# Patient Record
Sex: Female | Born: 1955 | Race: White | Hispanic: No | Smoking: Current every day smoker
Health system: Southern US, Community
[De-identification: ages and names within clinical notes are randomized; demographics above are authoritative.]

## PROBLEM LIST (undated history)

## (undated) DIAGNOSIS — I82409 Acute embolism and thrombosis of unspecified deep veins of unspecified lower extremity: Secondary | ICD-10-CM

## (undated) DIAGNOSIS — E119 Type 2 diabetes mellitus without complications: Secondary | ICD-10-CM

## (undated) DIAGNOSIS — Z860100 Personal history of colon polyps, unspecified: Secondary | ICD-10-CM

## (undated) DIAGNOSIS — K6819 Other retroperitoneal abscess: Secondary | ICD-10-CM

## (undated) DIAGNOSIS — I739 Peripheral vascular disease, unspecified: Secondary | ICD-10-CM

## (undated) DIAGNOSIS — E785 Hyperlipidemia, unspecified: Secondary | ICD-10-CM

## (undated) DIAGNOSIS — J449 Chronic obstructive pulmonary disease, unspecified: Secondary | ICD-10-CM

## (undated) DIAGNOSIS — K635 Polyp of colon: Secondary | ICD-10-CM

## (undated) DIAGNOSIS — R918 Other nonspecific abnormal finding of lung field: Secondary | ICD-10-CM

## (undated) DIAGNOSIS — Z720 Tobacco use: Secondary | ICD-10-CM

## (undated) DIAGNOSIS — F419 Anxiety disorder, unspecified: Secondary | ICD-10-CM

## (undated) HISTORY — DX: Personal history of colon polyps, unspecified: Z86.0100

## (undated) HISTORY — DX: Hyperlipidemia, unspecified: E78.5

## (undated) HISTORY — DX: Type 2 diabetes mellitus without complications: E11.9

## (undated) HISTORY — DX: Chronic obstructive pulmonary disease, unspecified: J44.9

## (undated) HISTORY — PX: CHOLECYSTECTOMY: SHX55

## (undated) HISTORY — DX: Personal history of colonic polyps: Z86.010

## (undated) HISTORY — DX: Other nonspecific abnormal finding of lung field: R91.8

## (undated) HISTORY — DX: Anxiety disorder, unspecified: F41.9

## (undated) HISTORY — DX: Tobacco use: Z72.0

## (undated) HISTORY — DX: Polyp of colon: K63.5

## (undated) HISTORY — DX: Acute embolism and thrombosis of unspecified deep veins of unspecified lower extremity: I82.409

## (undated) HISTORY — DX: Other retroperitoneal abscess: K68.19

---

## 2006-08-31 ENCOUNTER — Emergency Department: Payer: Self-pay | Admitting: Emergency Medicine

## 2007-05-03 HISTORY — PX: COLONOSCOPY: SHX174

## 2007-05-07 ENCOUNTER — Ambulatory Visit: Payer: Self-pay | Admitting: Gastroenterology

## 2007-12-02 ENCOUNTER — Emergency Department: Payer: Self-pay | Admitting: Emergency Medicine

## 2008-11-13 ENCOUNTER — Ambulatory Visit: Payer: Self-pay | Admitting: Cardiology

## 2008-11-13 ENCOUNTER — Inpatient Hospital Stay: Payer: Self-pay | Admitting: Internal Medicine

## 2012-08-02 DIAGNOSIS — R918 Other nonspecific abnormal finding of lung field: Secondary | ICD-10-CM

## 2012-08-02 HISTORY — DX: Other nonspecific abnormal finding of lung field: R91.8

## 2013-03-17 ENCOUNTER — Inpatient Hospital Stay: Payer: Self-pay | Admitting: Internal Medicine

## 2013-03-17 LAB — COMPREHENSIVE METABOLIC PANEL
Albumin: 1.7 g/dL — ABNORMAL LOW (ref 3.4–5.0)
Anion Gap: 15 (ref 7–16)
Bilirubin,Total: 0.5 mg/dL (ref 0.2–1.0)
Chloride: 92 mmol/L — ABNORMAL LOW (ref 98–107)
Co2: 23 mmol/L (ref 21–32)
EGFR (Non-African Amer.): >60
Glucose: 344 mg/dL — ABNORMAL HIGH (ref 65–99)
Osmolality: 276 (ref 275–301)
Potassium: 4.4 mmol/L (ref 3.5–5.1)
SGOT(AST): 18 U/L (ref 15–37)
Sodium: 130 mmol/L — ABNORMAL LOW (ref 136–145)

## 2013-03-17 LAB — CBC
HCT: 40.5 % (ref 35.0–47.0)
MCHC: 33 g/dL (ref 32.0–36.0)
MCV: 93 fL (ref 80–100)
RBC: 4.36 10*6/uL (ref 3.80–5.20)
RDW: 13.7 % (ref 11.5–14.5)

## 2013-03-17 LAB — URINALYSIS, COMPLETE
Bacteria: NONE SEEN
Bilirubin,UR: NEGATIVE
Nitrite: NEGATIVE
RBC,UR: 10 /HPF (ref 0–5)
RBC,UR: 1 /HPF (ref 0–5)
Specific Gravity: 1.005 (ref 1.003–1.030)
Specific Gravity: 1.005 (ref 1.003–1.030)
Squamous Epithelial: 2
WBC UR: 9 /HPF (ref 0–5)

## 2013-03-17 LAB — PROTIME-INR: Prothrombin Time: 14.8 secs — ABNORMAL HIGH (ref 11.5–14.7)

## 2013-03-18 LAB — COMPREHENSIVE METABOLIC PANEL
Alkaline Phosphatase: 103 U/L (ref 50–136)
Anion Gap: 7 (ref 7–16)
BUN: 10 mg/dL (ref 7–18)
Calcium, Total: 7.7 mg/dL — ABNORMAL LOW (ref 8.5–10.1)
Co2: 25 mmol/L (ref 21–32)
Glucose: 183 mg/dL — ABNORMAL HIGH (ref 65–99)
Osmolality: 276 (ref 275–301)
Potassium: 3.8 mmol/L (ref 3.5–5.1)
SGOT(AST): 19 U/L (ref 15–37)
SGPT (ALT): 7 U/L — ABNORMAL LOW (ref 12–78)
Total Protein: 5.1 g/dL — ABNORMAL LOW (ref 6.4–8.2)

## 2013-03-18 LAB — CBC WITH DIFFERENTIAL/PLATELET
Basophil #: 0.1 10*3/uL (ref 0.0–0.1)
Basophil %: 0.6 %
HGB: 10.8 g/dL — ABNORMAL LOW (ref 12.0–16.0)
Lymphocyte %: 11.6 %
Monocyte #: 1 x10 3/mm — ABNORMAL HIGH (ref 0.2–0.9)
Monocyte %: 5.4 %
Neutrophil %: 81.2 %
RBC: 3.5 10*6/uL — ABNORMAL LOW (ref 3.80–5.20)
WBC: 17.7 10*3/uL — ABNORMAL HIGH (ref 3.6–11.0)

## 2013-03-19 LAB — CBC WITH DIFFERENTIAL/PLATELET
Basophil #: 0 10*3/uL (ref 0.0–0.1)
Basophil %: 0.3 %
Eosinophil %: 3.7 %
HCT: 32.2 % — ABNORMAL LOW (ref 35.0–47.0)
HGB: 11 g/dL — ABNORMAL LOW (ref 12.0–16.0)
Lymphocyte #: 1.8 10*3/uL (ref 1.0–3.6)
MCV: 91 fL (ref 80–100)
Monocyte #: 0.8 x10 3/mm (ref 0.2–0.9)
Neutrophil %: 70.5 %
Platelet: 347 10*3/uL (ref 150–440)
RBC: 3.53 10*6/uL — ABNORMAL LOW (ref 3.80–5.20)
RDW: 13.5 % (ref 11.5–14.5)
WBC: 10.2 10*3/uL (ref 3.6–11.0)

## 2013-03-19 LAB — URINE CULTURE

## 2013-03-21 LAB — MISC AER/ANAEROBIC CULT.

## 2013-03-22 LAB — CULTURE, BLOOD (SINGLE)

## 2013-03-24 ENCOUNTER — Encounter: Payer: Self-pay | Admitting: *Deleted

## 2013-03-24 ENCOUNTER — Telehealth: Payer: Self-pay | Admitting: *Deleted

## 2013-03-24 NOTE — Telephone Encounter (Signed)
TCM attempt #1- I left a message for the patient to call. D/C 9/20 from [facility]. Follow up appointment on 9/24 with Alinda Money.

## 2013-03-25 ENCOUNTER — Encounter: Payer: Self-pay | Admitting: Physician Assistant

## 2013-03-25 NOTE — Telephone Encounter (Signed)
Pt sched for today

## 2013-04-01 ENCOUNTER — Ambulatory Visit: Payer: Self-pay | Admitting: Internal Medicine

## 2013-04-08 ENCOUNTER — Inpatient Hospital Stay: Payer: Self-pay | Admitting: Internal Medicine

## 2013-04-08 LAB — CBC
HCT: 37.2 % (ref 35.0–47.0)
MCH: 32.4 pg (ref 26.0–34.0)
MCHC: 34.4 g/dL (ref 32.0–36.0)
Platelet: 227 10*3/uL (ref 150–440)
RDW: 16.8 % — ABNORMAL HIGH (ref 11.5–14.5)
WBC: 9.1 10*3/uL (ref 3.6–11.0)

## 2013-04-08 LAB — APTT: Activated PTT: 30.6 secs (ref 23.6–35.9)

## 2013-04-08 LAB — PROTIME-INR: Prothrombin Time: 15 secs — ABNORMAL HIGH (ref 11.5–14.7)

## 2013-04-08 LAB — COMPREHENSIVE METABOLIC PANEL
Albumin: 2.4 g/dL — ABNORMAL LOW (ref 3.4–5.0)
Alkaline Phosphatase: 84 U/L (ref 50–136)
Anion Gap: 3 — ABNORMAL LOW (ref 7–16)
BUN: 10 mg/dL (ref 7–18)
Calcium, Total: 8.7 mg/dL (ref 8.5–10.1)
Chloride: 102 mmol/L (ref 98–107)
Creatinine: 0.57 mg/dL — ABNORMAL LOW (ref 0.60–1.30)
EGFR (African American): >60
EGFR (Non-African Amer.): >60
Glucose: 300 mg/dL — ABNORMAL HIGH (ref 65–99)
Osmolality: 282 (ref 275–301)
Potassium: 4.2 mmol/L (ref 3.5–5.1)
SGOT(AST): 14 U/L — ABNORMAL LOW (ref 15–37)
SGPT (ALT): 10 U/L — ABNORMAL LOW (ref 12–78)
Total Protein: 6.7 g/dL (ref 6.4–8.2)

## 2013-04-08 LAB — CULTURE, FUNGUS WITHOUT SMEAR

## 2013-04-09 LAB — CBC WITH DIFFERENTIAL/PLATELET
Basophil #: 0 10*3/uL (ref 0.0–0.1)
Eosinophil #: 0.3 10*3/uL (ref 0.0–0.7)
Eosinophil %: 4.2 %
HGB: 11 g/dL — ABNORMAL LOW (ref 12.0–16.0)
Lymphocyte %: 43.1 %
MCH: 32.2 pg (ref 26.0–34.0)
MCHC: 33.5 g/dL (ref 32.0–36.0)
Neutrophil %: 44.9 %
Platelet: 208 10*3/uL (ref 150–440)
RDW: 16.2 % — ABNORMAL HIGH (ref 11.5–14.5)
WBC: 7.1 10*3/uL (ref 3.6–11.0)

## 2013-04-09 LAB — COMPREHENSIVE METABOLIC PANEL
Alkaline Phosphatase: 98 U/L (ref 50–136)
Anion Gap: 4 — ABNORMAL LOW (ref 7–16)
BUN: 12 mg/dL (ref 7–18)
Co2: 29 mmol/L (ref 21–32)
Creatinine: 0.66 mg/dL (ref 0.60–1.30)
EGFR (African American): >60
EGFR (Non-African Amer.): >60
Glucose: 519 mg/dL (ref 65–99)
Osmolality: 291 (ref 275–301)
Sodium: 134 mmol/L — ABNORMAL LOW (ref 136–145)

## 2013-04-09 LAB — APTT
Activated PTT: 55 secs — ABNORMAL HIGH (ref 23.6–35.9)
Activated PTT: 63.2 secs — ABNORMAL HIGH (ref 23.6–35.9)

## 2013-04-09 LAB — PROTIME-INR
INR: 1.1
Prothrombin Time: 14.2 secs (ref 11.5–14.7)

## 2013-05-02 ENCOUNTER — Ambulatory Visit: Payer: Self-pay | Admitting: Internal Medicine

## 2013-06-03 ENCOUNTER — Emergency Department: Payer: Self-pay | Admitting: Emergency Medicine

## 2013-06-03 LAB — COMPREHENSIVE METABOLIC PANEL
Albumin: 3.3 g/dL — ABNORMAL LOW (ref 3.4–5.0)
Alkaline Phosphatase: 98 U/L
Anion Gap: 5 — ABNORMAL LOW (ref 7–16)
BUN: 19 mg/dL — ABNORMAL HIGH (ref 7–18)
Bilirubin,Total: 0.3 mg/dL (ref 0.2–1.0)
Chloride: 102 mmol/L (ref 98–107)
EGFR (African American): >60
EGFR (Non-African Amer.): >60
Glucose: 381 mg/dL — ABNORMAL HIGH (ref 65–99)
Potassium: 4.3 mmol/L (ref 3.5–5.1)
Total Protein: 7.2 g/dL (ref 6.4–8.2)

## 2013-06-03 LAB — PROTIME-INR
INR: 1.1
Prothrombin Time: 14.1 secs (ref 11.5–14.7)

## 2013-06-03 LAB — CBC
HCT: 41.4 % (ref 35.0–47.0)
MCHC: 33.6 g/dL (ref 32.0–36.0)
MCV: 94 fL (ref 80–100)
Platelet: 197 10*3/uL (ref 150–440)
RDW: 14.6 % — ABNORMAL HIGH (ref 11.5–14.5)

## 2013-08-21 ENCOUNTER — Inpatient Hospital Stay: Payer: Self-pay | Admitting: Internal Medicine

## 2013-08-21 LAB — BASIC METABOLIC PANEL
Anion Gap: 3 — ABNORMAL LOW (ref 7–16)
BUN: 16 mg/dL (ref 7–18)
CREATININE: 0.81 mg/dL (ref 0.60–1.30)
Calcium, Total: 8.6 mg/dL (ref 8.5–10.1)
Chloride: 104 mmol/L (ref 98–107)
Co2: 31 mmol/L (ref 21–32)
EGFR (African American): >60
EGFR (Non-African Amer.): >60
GLUCOSE: 394 mg/dL — AB (ref 65–99)
OSMOLALITY: 293 (ref 275–301)
Potassium: 4.4 mmol/L (ref 3.5–5.1)
Sodium: 138 mmol/L (ref 136–145)

## 2013-08-21 LAB — CBC
HCT: 43.8 % (ref 35.0–47.0)
HGB: 14.9 g/dL (ref 12.0–16.0)
MCH: 32.8 pg (ref 26.0–34.0)
MCHC: 34 g/dL (ref 32.0–36.0)
MCV: 97 fL (ref 80–100)
Platelet: 169 10*3/uL (ref 150–440)
RBC: 4.54 10*6/uL (ref 3.80–5.20)
RDW: 13.8 % (ref 11.5–14.5)
WBC: 6.7 10*3/uL (ref 3.6–11.0)

## 2013-08-21 LAB — APTT
ACTIVATED PTT: 26.5 s (ref 23.6–35.9)
Activated PTT: 43.7 secs — ABNORMAL HIGH (ref 23.6–35.9)

## 2013-08-21 LAB — PROTIME-INR
INR: 1.1
Prothrombin Time: 13.8 secs (ref 11.5–14.7)

## 2013-08-22 ENCOUNTER — Ambulatory Visit: Payer: Self-pay | Admitting: Hematology and Oncology

## 2013-08-22 LAB — CBC WITH DIFFERENTIAL/PLATELET
BASOS ABS: 0 10*3/uL (ref 0.0–0.1)
BASOS PCT: 0.4 %
Eosinophil #: 0.2 10*3/uL (ref 0.0–0.7)
Eosinophil %: 3.8 %
HCT: 42.5 % (ref 35.0–47.0)
HGB: 14.1 g/dL (ref 12.0–16.0)
Lymphocyte #: 3.3 10*3/uL (ref 1.0–3.6)
Lymphocyte %: 50.4 %
MCH: 31.8 pg (ref 26.0–34.0)
MCHC: 33.1 g/dL (ref 32.0–36.0)
MCV: 96 fL (ref 80–100)
Monocyte #: 0.9 x10 3/mm (ref 0.2–0.9)
Monocyte %: 13 %
NEUTROS ABS: 2.1 10*3/uL (ref 1.4–6.5)
NEUTROS PCT: 32.4 %
Platelet: 177 10*3/uL (ref 150–440)
RBC: 4.42 10*6/uL (ref 3.80–5.20)
RDW: 13.7 % (ref 11.5–14.5)
WBC: 6.6 10*3/uL (ref 3.6–11.0)

## 2013-08-22 LAB — TSH: Thyroid Stimulating Horm: 1.58 u[IU]/mL

## 2013-08-22 LAB — BASIC METABOLIC PANEL
Anion Gap: 4 — ABNORMAL LOW (ref 7–16)
BUN: 17 mg/dL (ref 7–18)
CREATININE: 0.7 mg/dL (ref 0.60–1.30)
Calcium, Total: 8.2 mg/dL — ABNORMAL LOW (ref 8.5–10.1)
Chloride: 105 mmol/L (ref 98–107)
Co2: 29 mmol/L (ref 21–32)
EGFR (Non-African Amer.): >60
GLUCOSE: 264 mg/dL — AB (ref 65–99)
OSMOLALITY: 286 (ref 275–301)
Potassium: 4 mmol/L (ref 3.5–5.1)
Sodium: 138 mmol/L (ref 136–145)

## 2013-08-22 LAB — HEMOGLOBIN A1C: HEMOGLOBIN A1C: 11.8 % — AB (ref 4.2–6.3)

## 2013-08-22 LAB — APTT
ACTIVATED PTT: 61.2 s — AB (ref 23.6–35.9)
Activated PTT: 81.8 secs — ABNORMAL HIGH (ref 23.6–35.9)

## 2013-08-24 LAB — BASIC METABOLIC PANEL
Anion Gap: 2 — ABNORMAL LOW (ref 7–16)
BUN: 16 mg/dL (ref 7–18)
CO2: 32 mmol/L (ref 21–32)
Calcium, Total: 8.6 mg/dL (ref 8.5–10.1)
Chloride: 108 mmol/L — ABNORMAL HIGH (ref 98–107)
Creatinine: 0.66 mg/dL (ref 0.60–1.30)
EGFR (Non-African Amer.): >60
Glucose: 84 mg/dL (ref 65–99)
Osmolality: 284 (ref 275–301)
Potassium: 3.9 mmol/L (ref 3.5–5.1)
Sodium: 142 mmol/L (ref 136–145)

## 2013-09-30 ENCOUNTER — Ambulatory Visit: Payer: Self-pay | Admitting: Hematology and Oncology

## 2013-09-30 LAB — PROTIME-INR
INR: 2.1
PROTHROMBIN TIME: 22.9 s — AB (ref 11.5–14.7)

## 2013-10-07 LAB — PROTIME-INR
INR: 1.1
Prothrombin Time: 14 secs (ref 11.5–14.7)

## 2013-10-12 LAB — HEPATIC FUNCTION PANEL A (ARMC)
ALK PHOS: 20 U/L — AB
AST: 18 U/L (ref 15–37)
Albumin: 3.4 g/dL (ref 3.4–5.0)
Bilirubin, Direct: 0.1 mg/dL (ref 0.00–0.20)
Bilirubin,Total: 0.3 mg/dL (ref 0.2–1.0)
SGPT (ALT): 20 U/L (ref 12–78)
Total Protein: 7.3 g/dL (ref 6.4–8.2)

## 2013-10-12 LAB — CREATININE, SERUM: Creatinine: 0.75 mg/dL (ref 0.60–1.30)

## 2013-10-12 LAB — PROTIME-INR
INR: 1.7
Prothrombin Time: 19.3 secs — ABNORMAL HIGH (ref 11.5–14.7)

## 2013-10-13 LAB — CANCER ANTIGEN 19-9: CA 19-9: 24 U/mL (ref 0–35)

## 2013-10-13 LAB — CEA: CEA: 7.7 ng/mL — AB (ref 0.0–4.7)

## 2013-10-13 LAB — CA 125: CA 125: 16.1 U/mL (ref 0.0–34.0)

## 2013-10-30 ENCOUNTER — Ambulatory Visit: Payer: Self-pay | Admitting: Hematology and Oncology

## 2013-12-09 ENCOUNTER — Emergency Department: Payer: Self-pay | Admitting: Emergency Medicine

## 2014-01-30 DIAGNOSIS — R970 Elevated carcinoembryonic antigen [CEA]: Secondary | ICD-10-CM | POA: Insufficient documentation

## 2014-02-03 ENCOUNTER — Ambulatory Visit: Payer: Self-pay | Admitting: Hematology and Oncology

## 2014-02-03 LAB — CBC CANCER CENTER
BASOS PCT: 0.5 %
Basophil #: 0 x10 3/mm (ref 0.0–0.1)
Eosinophil #: 0.3 x10 3/mm (ref 0.0–0.7)
Eosinophil %: 3.4 %
HCT: 45.3 % (ref 35.0–47.0)
HGB: 15.1 g/dL (ref 12.0–16.0)
Lymphocyte #: 2.8 x10 3/mm (ref 1.0–3.6)
Lymphocyte %: 33.1 %
MCH: 32.8 pg (ref 26.0–34.0)
MCHC: 33.4 g/dL (ref 32.0–36.0)
MCV: 98 fL (ref 80–100)
MONOS PCT: 7.9 %
Monocyte #: 0.7 x10 3/mm (ref 0.2–0.9)
NEUTROS ABS: 4.7 x10 3/mm (ref 1.4–6.5)
Neutrophil %: 55.1 %
PLATELETS: 159 x10 3/mm (ref 150–440)
RBC: 4.61 10*6/uL (ref 3.80–5.20)
RDW: 12.9 % (ref 11.5–14.5)
WBC: 8.5 x10 3/mm (ref 3.6–11.0)

## 2014-02-03 LAB — PROTIME-INR
INR: 1
Prothrombin Time: 12.6 secs (ref 11.5–14.7)

## 2014-02-04 LAB — CEA: CEA: 10.9 ng/mL — AB (ref 0.0–4.7)

## 2014-03-02 ENCOUNTER — Ambulatory Visit: Payer: Self-pay | Admitting: Hematology and Oncology

## 2014-10-13 ENCOUNTER — Inpatient Hospital Stay
Admit: 2014-10-13 | Disposition: A | Discharge status: Home / Self Care | Payer: Self-pay | Attending: Internal Medicine | Admitting: Internal Medicine

## 2014-10-13 LAB — CBC
HCT: 47.2 % — ABNORMAL HIGH (ref 35.0–47.0)
HGB: 15.1 g/dL (ref 12.0–16.0)
MCH: 31.5 pg (ref 26.0–34.0)
MCHC: 31.9 g/dL — AB (ref 32.0–36.0)
MCV: 99 fL (ref 80–100)
Platelet: 176 10*3/uL (ref 150–440)
RBC: 4.79 10*6/uL (ref 3.80–5.20)
RDW: 14 % (ref 11.5–14.5)
WBC: 8.6 10*3/uL (ref 3.6–11.0)

## 2014-10-13 LAB — BASIC METABOLIC PANEL
Anion Gap: 9 (ref 7–16)
Anion Gap: 3 — ABNORMAL LOW (ref 7–16)
BUN: 15 mg/dL
BUN: 11 mg/dL
CREATININE: 0.88 mg/dL
Calcium, Total: 8.9 mg/dL
Calcium, Total: 7.7 mg/dL — ABNORMAL LOW
Chloride: 91 mmol/L — ABNORMAL LOW
Chloride: 104 mmol/L
Co2: 30 mmol/L
Co2: 30 mmol/L
Creatinine: 0.62 mg/dL
EGFR (African American): >60
EGFR (Non-African Amer.): >60
GLUCOSE: 830 mg/dL — AB
Glucose: 286 mg/dL — ABNORMAL HIGH
POTASSIUM: 4.6 mmol/L
Potassium: 4 mmol/L
SODIUM: 130 mmol/L — AB
SODIUM: 137 mmol/L

## 2014-10-13 LAB — URINALYSIS, COMPLETE
BACTERIA: NONE SEEN
BILIRUBIN, UR: NEGATIVE
Ketone: NEGATIVE
Nitrite: NEGATIVE
Ph: 6 (ref 4.5–8.0)
Protein: 30
Specific Gravity: 1.031 (ref 1.003–1.030)

## 2014-10-13 LAB — TROPONIN I

## 2014-10-13 LAB — PROTIME-INR
INR: 0.9
Prothrombin Time: 12.6 secs

## 2014-10-14 LAB — COMPREHENSIVE METABOLIC PANEL
ALBUMIN: 2.7 g/dL — AB
Alkaline Phosphatase: 87 U/L
Anion Gap: 4 — ABNORMAL LOW (ref 7–16)
BUN: 9 mg/dL
Bilirubin,Total: <0.1 mg/dL — ABNORMAL LOW
CALCIUM: 7.7 mg/dL — AB
CHLORIDE: 109 mmol/L
CO2: 29 mmol/L
Creatinine: 0.53 mg/dL
EGFR (African American): >60
EGFR (Non-African Amer.): >60
GLUCOSE: 142 mg/dL — AB
Potassium: 3.6 mmol/L
SGOT(AST): 11 U/L — ABNORMAL LOW
SGPT (ALT): 8 U/L — ABNORMAL LOW
SODIUM: 142 mmol/L
TOTAL PROTEIN: 5.4 g/dL — AB

## 2014-10-14 LAB — CBC WITH DIFFERENTIAL/PLATELET
Basophil #: 0 10*3/uL (ref 0.0–0.1)
Basophil %: 0.4 %
EOS ABS: 0.3 10*3/uL (ref 0.0–0.7)
Eosinophil %: 3.9 %
HCT: 41.3 % (ref 35.0–47.0)
HGB: 13.6 g/dL (ref 12.0–16.0)
Lymphocyte #: 2.7 10*3/uL (ref 1.0–3.6)
Lymphocyte %: 37.4 %
MCH: 31.5 pg (ref 26.0–34.0)
MCHC: 33 g/dL (ref 32.0–36.0)
MCV: 96 fL (ref 80–100)
MONO ABS: 0.5 x10 3/mm (ref 0.2–0.9)
Monocyte %: 7.4 %
NEUTROS PCT: 50.9 %
Neutrophil #: 3.7 10*3/uL (ref 1.4–6.5)
PLATELETS: 156 10*3/uL (ref 150–440)
RBC: 4.32 10*6/uL (ref 3.80–5.20)
RDW: 13.8 % (ref 11.5–14.5)
WBC: 7.2 10*3/uL (ref 3.6–11.0)

## 2014-10-14 LAB — BASIC METABOLIC PANEL
ANION GAP: 3 — AB (ref 7–16)
BUN: 10 mg/dL
CALCIUM: 7.7 mg/dL — AB
Chloride: 106 mmol/L
Co2: 32 mmol/L
Creatinine: 0.54 mg/dL
EGFR (African American): >60
Glucose: 168 mg/dL — ABNORMAL HIGH
Potassium: 3.3 mmol/L — ABNORMAL LOW
Sodium: 141 mmol/L

## 2014-10-14 LAB — PHOSPHORUS: Phosphorus: 3.2 mg/dL

## 2014-10-14 LAB — PROTIME-INR
INR: 1
PROTHROMBIN TIME: 13.6 s

## 2014-10-14 LAB — MAGNESIUM: Magnesium: 1.6 mg/dL — ABNORMAL LOW

## 2014-10-14 LAB — CREATININE, SERUM: Creatine, Serum: 0.53

## 2014-10-15 LAB — PROTIME-INR
INR: 1.7
PROTHROMBIN TIME: 20.4 s — AB

## 2014-10-15 LAB — URINE CULTURE

## 2014-10-16 LAB — URINE CULTURE

## 2014-10-22 NOTE — Consult Note (Signed)
PATIENT NAME:  April [name], April [name] MR#:  [id number] DATE OF BIRTH:  02/09/1956  HEMATOLOGY CONSULTATION  DATE OF CONSULTATION:  04/09/2013  CONSULTING PHYSICIAN:  Knute Neuobert G. Lorre NickGittin, MD  [name] is a 59 year old patient who was admitted October 8th with a left lower extremity DVT. The pertinent history is that this patient first came in on September 16th, and was  hospitalized for 3 days from the 17th through the 19th. At that time she had a number of days of abdominal pain, nausea and vomiting. She was found to have on CT of the abdomen a retroperitoneal abscess. She was treated with Cipro and Flagyl, and surgery evaluation. CT-guided drainage was performed. A Foley catheter was also placed. The patient was discharged home on September 19th with plans with plans for surgical and urology followup. The patient is now readmitted October 8th with a history of more than week, probably 2 weeks or so, of lower extremity pain, waxing and waning and swelling. The patient describes that after going home she has a very sedentary existence, getting up for the toilet and back to bed, and then after a number of days the legs start to hurt, but keeping the swelling coming down it is progressively worse, and now she is in the Emergency Room. The Doppler is positive for extensive clot. Vascular surgery was consulted but did not feel acute intervention was indicated. The patient was started on a heparin drip.   PAST MEDICAL HISTORY: Includes diabetes, COPD; she uses oxygen on exertion but not at rest, hyperlipidemia, colon polyps as there is a record of a colonoscopy in 05/2007, and the patient is a smoker of 1-1/2 packs of cigarettes daily for many years.   No prior surgeries.   No known allergies.   HOME MEDICATIONS: Have been listed as Reglan and metformin, but also had completed a course of Flagyl and Cipro. The patient had some p.r.n. Percocet.   FAMILY HISTORY: Really unrevealing.   SOCIAL HISTORY: Smoking,  as noted. No alcohol history.   FAMILY HISTORY: The mother had brain cancer, probably a primary brain tumor, at the age of 60.   SYSTEM REVIEW: The patient was very comfortable when I evaluated her. She reports that the swelling had gone down significantly in the leg and indeed on exam the leg barely has increased pain versus the opposite leg. It is also not painful but this time. Denies headache or dizziness, chills or sweats. No visual disturbances. No ear or jaw pain. No cough, wheezing, or hemoptysis. No palpitations or retrosternal chest pain. Has chronic COPD and shortness of breath on exertion. Was not short of breath now. No wheezing. Not having abdominal pain, nausea or vomiting or diarrhea. Has had no dysuria. She had urine retention and had a Foley catheter for weeks. No cold intolerance. No polyuria. No history of easy bleeding or bruising. No extremity weakness or focal weakness, numbness or tingling, and currently feels no subjective sadness. No depression. Does not feel anxious.   On exam patient is a very thin, cachectic, but not appearing in distress. Has slight pallor. No jaundice.  HEENT: No thrush in her mouth.  LYMPH: No palpable lymph nodes in the neck. There is some in the axilla.  HEART: Regular.  LUNGS: Decreased air entry, but clear.  ABDOMEN: Nontender. No palpable mass or organomegaly.   There is minimal edema in the left leg. The patient is alert and oriented. Mood and affect is normal.   LABORATORY DATA: The baseline protime  was 1.2, and PTT was 30.6. White count 9.1, hemoglobin 12.8, platelets 227.   Liver chemistries normal except an albumin of 2.4, creatinine 0.57. Sugar was elevated on admission at 300, and a Doppler of the left lower extremity was positive for extensive occlusive deep vein thrombosis extending to the femoral and popliteal vein.   IMPRESSION AND PLAN: Patient with a new deep venous thrombosis, but this followed a hospitalization with infection,  inflammation, immobility, and the patient reports a prolonged immobility, then developing pain and swelling.   It is certainly likely that this is a proximate cause of the clot. However, the patient has had recent abscess cavity in the right lower abdomen, being followed by surgery for potential causes, those pending considered could possibly include underlying possibly malignant disease of the colon, appendix, so the patient is appropriately planned for surgical followup and colonoscopy, and plan went well on repeat CT scan of the abdomen and pelvis. Scan was done on September 16th  with no obvious malignancy, but findings of infection and inflammation. The patient will always have age-appropriate cancer screening anyway and make sure she stays up to date on mammograms, later for completeness and tumor markers, but not when the patient has abdominal infection, inflammation will get false-positive results. Although as well, I think given that there is a proximal cause of the clot it is still prudent to check lupus inhibitor and phospholipid antibodies as well as prothrombin mutation and, V liden mutation There is no family history of any other high-risk features, and this is the patient's first lifetime clot. I will follow as results are available.   Length of anticoagulation to be determined, but approximate cause if medical issues are resolved, with no underlying problem, the patient should have 3 months of anticoagulation, and if there is no residual vascular surgery issues that would be sufficient. Otherwise prolong anticoagulation depending on clinical indication.   Finally, the patient is in the age range and has smoking history of 1-1/2 packs a day for more  30 years where she could be recommended for a screening noncontrasted chest CT for cancer screening.   I will follow.    ____________________________ Knute Neu Lorre Nick, MD rgg:dm D: 04/09/2013 08:09:28 ET T: 04/09/2013 08:26:15  ET JOB#: [id number]  cc: Knute Neu. Lorre Nick, MD, <Dictator> Marin Roberts MD ELECTRONICALLY SIGNED 05/20/2013 12:24

## 2014-10-22 NOTE — Consult Note (Signed)
CT Cystogram reviewed.communication to abscess.Cx negative.to remove foley when ready from medical standpoint.do not feel cysto absoultely indicated.follow.other rec's at present.  Electronic Signatures: Smith Robertope, Conny Moening S (MD)  (Signed on 19-Sep-14 16:22)  Authored  Last Updated: 19-Sep-14 16:22 by Smith Robertope, Yolunda Kloos S (MD)

## 2014-10-22 NOTE — Consult Note (Signed)
PATIENT NAME:  April [name], April [name] MR#:  [id number] DATE OF BIRTH:  [birth year]  DATE OF CONSULTATION:  03/17/2013  CONSULTING PHYSICIAN:  Madolyn FriezeBrian S. Achilles Dunkope, MD  CLINICAL HISTORY:  Ms. April [name] is a 59 year old white female with diabetes mellitus and emphysema. She presented to the Emergency Room with abdominal pain that started 1 week prior. She also had nausea and vomiting. She denied any significant fever. She underwent a CT scan in the Emergency Room demonstrating a large retroperitoneal abscess on the right with a distended bladder with no evidence of hydronephrosis or obstruction. Urological consultation was requested for catheter placement and management of possible connection of the retroperitoneal abscess and the urinary system. Air was also noted within the bladder on the CT scan.  PAST MEDICAL HISTORY:  Significant for diabetes mellitus, anxiety, COPD with chronic oxygen use, hyperlipidemia, tobacco abuse, colonic polyps.   PAST SURGICAL HISTORY:  Significant for colonoscopy.   ALLERGIES:  No known drug allergies.   MEDICATIONS ON ADMISSION:  Metoclopramide 10 mg 1/2 tablet to 1 tablet 4 times a day.   SOCIAL HISTORY: The patient smokes 1/2 pack of cigarettes daily since the age of 59. She denies any significant alcohol or drug use.   PHYSICAL EXAMINATION: VITAL SIGNS:  Stable.  HEENT:  Within normal limits.  CHEST:  Clear to auscultation bilaterally.  CARDIOVASCULAR:  Regular rate and rhythm.  ABDOMEN:  Soft, mild generalized right abdominal tenderness. Mild right CVA tenderness. No palpable masses.  EXTREMITIES:  Free range of motion x 4.  NEUROLOGIC:  Motor and sensory grossly intact.   ASSESSMENT: Urinary retention, retroperitoneal abscess, pneumaturia.   RECOMMENDATION: The retroperitoneal abscess is likely bowel related, either from appendix or other diverticula rupture. The air within the bladder is likely related to bacteria; however, fistula formation from the abscess to the  bladder must be considered. A CT cystogram will be arranged for further evaluation from the standpoint. If a connection is identified, Foley catheter drainage with drainage of the abscess will likely resolve the connection. If no abscess is identified, I would recommend leaving the Foley catheter for at least 5 days. As the abscess is drained, she should have less difficulty with urination. This was more of a compression affect related to the abscess. I do not feel that Flomax is absolutely indicated at present. She may need eventual cystoscopy for further evaluation of the bladder. She denies any prior history of recurrent bladder infections. She had only recently started having some difficulty with urination. We will therefore monitor for now. If there are any further questions, please free to contact us.   ____________________________ Madolyn FriezeBrian S. Achilles Dunkope, MD bsc:ce D: 03/17/2013 17:45:45 ET T: 03/17/2013 18:10:31 ET JOB#: [id number]  cc: Madolyn FriezeBrian S. Achilles Dunkope, MD, <Dictator> Madolyn FriezeBRIAN S Chrystle Murillo MD ELECTRONICALLY SIGNED 03/18/2013 22:22

## 2014-10-22 NOTE — Consult Note (Signed)
General Aspect DVT left leg   Present Illness The patient is a 59 year old female with a known history of diabetes, anxiety, COPD with 2 to 2.5 liter oxygen at baseline requirement, is being admitted for new onset left lower extremity DVT. The patient was here in the hospital from 03/17/2013 until 03/20/2013 when she had a retroperitoneal abscess and urinary retention for which Foley catheter was placed.  The patient was scheduled to get her Foley removed yesterday and she missed her urology appointment with Dr. Jacqlyn Larsen so she came to the ED to get her Foley removed. While in the ED she also reported left lower extremity pain and swelling, ongoing for the last 2 to 3 weeks.  Initially she was ignoring it as the swelling seem to be improving somewhat.  However, given her history a duplex ultrasound was ordered while she was in the ED.  Lower extremity Doppler showed extensive DVT from femoral to popliteal vein which is occlusive.   She is complaining of a lot of left lower extremity pain. She denies any increase in her SOB.  She denies pleuritic chest pain.  PAST MEDICAL HISTORY: 1.  Diabetes. 2.  Anxiety. 3.  COPD on 2 to 2.5 liters oxygen at baseline. 4.  Hyperlipidemia. 5.  Tobacco abuse.    6.  Colonic polyp on colonoscopy on November 2008.   PAST SURGICAL HISTORY: None.   Home Medications: Medication Instructions Status  metFORMIN 500 mg oral tablet 1 tab(s) orally 2 times a day Active  metoclopramide 10 mg oral tablet 0.5-1 tab(s) orally 4 times a day (before meals and at bedtime) Active    No Known Allergies:   Case History:  Family History Non-Contributory   Social History positive tobacco (Greater than 1 year), negative ETOH, negative Illicit drugs   Review of Systems:  Fever/Chills No   Cough No   Sputum No   Abdominal Pain No   Diarrhea No   Constipation No   Nausea/Vomiting No   SOB/DOE No   Chest Pain No   Telemetry Reviewed NSR   Physical Exam:  GEN  well developed, disheveled, ill appearing   HEENT hearing intact to voice, moist oral mucosa, poor dentition   NECK supple  trachea midline   RESP normal resp effort  no use of accessory muscles  O2 in place by [place]   CARD regular rate  no JVD   ABD denies tenderness  nondistended   EXTR negative cyanosis/clubbing, positive edema, toes pink bilaterally and warm to touch  brisk cap refill   SKIN normal to palpation, No rashes   NEURO cranial nerves intact, follows commands   PSYCH alert, poor insight, anxious   Nursing/Ancillary Notes: **Vital Signs.:   08-Oct-14 13:00  Vital Signs Type Admission  Temperature Temperature (F) 98.3  Celsius 36.8  Pulse Pulse 97  Respirations Respirations 20  Systolic BP Systolic BP 98  Diastolic BP (mmHg) Diastolic BP (mmHg) 61  Mean BP 73  Pulse Ox % Pulse Ox % 96  Pulse Ox Activity Level  At rest  Oxygen Delivery Room Air/ 21 %   Hepatic:  08-Oct-14 10:28   Bilirubin, Total 0.3  Alkaline Phosphatase 84  SGPT (ALT)  10  SGOT (AST)  14  Total Protein, Serum 6.7  Albumin, Serum  2.4  Routine Chem:  08-Oct-14 10:28   Glucose, Serum  300  BUN 10  Creatinine (comp)  0.57  Sodium, Serum 136  Potassium, Serum 4.2  Chloride, Serum 102  CO2,  Serum 31  Calcium (Total), Serum 8.7  Osmolality (calc) 282  eGFR (African American) >60  eGFR (Non-African American) >60 (eGFR values <10m/min/1.73 m2 may be an indication of chronic kidney disease (CKD). Calculated eGFR is useful in patients with stable renal function. The eGFR calculation will not be reliable in acutely ill patients when serum creatinine is changing rapidly. It is not useful in  patients on dialysis. The eGFR calculation may not be applicable to patients at the low and high extremes of body sizes, pregnant women, and vegetarians.)  Result Comment APTT CANCEL - DUPLICATE ORDER ##[id number] Result(s) reported on 08 Apr 2013 at 12:17PM.  Anion Gap  3  Routine Coag:  08-Oct-14  10:28   Activated PTT (APTT) - (A HCT value >55% may artifactually increase the APTT. In one study, the increase was an average of 19%. Reference: "Effect on Routine and Special Coagulation Testing Values of Citrate Anticoagulant Adjustment in Patients with High HCT Values." American Journal of Clinical Pathology 2006;126:400-405.)  Activated PTT (APTT) 30.6 (A HCT value >55% may artifactually increase the APTT. In one study, the increase was an average of 19%. Reference: "Effect on Routine and Special Coagulation Testing Values of Citrate Anticoagulant Adjustment in Patients with High HCT Values." American Journal of Clinical Pathology 2006;126:400-405.)  Prothrombin  15.0  INR 1.2 (INR reference interval applies to patients on anticoagulant therapy. A single INR therapeutic range for coumarins is not optimal for all indications; however, the suggested range for most indications is 2.0 - 3.0. Exceptions to the INR Reference Range may include: Prosthetic heart valves, acute myocardial infarction, prevention of myocardial infarction, and combinations of aspirin and anticoagulant. The need for a higher or lower target INR must be assessed individually. Reference: The Pharmacology and Management of the Vitamin K  antagonists: the seventh ACCP Conference on Antithrombotic and Thrombolytic Therapy. COQHUT.6546Sept:126 (3suppl): [id number] A HCT value >55% may artifactually increase the PT.  In one study,  the increase was an average of 25%. Reference:  "Effect on Routine and Special Coagulation Testing Values of Citrate Anticoagulant Adjustment in Patients with High HCT Values." American Journal of Clinical Pathology 2006;126:400-405.)    19:46   Activated PTT (APTT)  44.3 (A HCT value >55% may artifactually increase the APTT. In one study, the increase was an average of 19%. Reference: "Effect on Routine and Special Coagulation Testing Values of Citrate Anticoagulant Adjustment in  Patients with High HCT Values." American Journal of Clinical Pathology 2006;126:400-405.)  Routine Hem:  08-Oct-14 10:28   WBC (CBC) 9.1  RBC (CBC) 3.95  Hemoglobin (CBC) 12.8  Hematocrit (CBC) 37.2  Platelet Count (CBC) 227 (Result(s) reported on 08 Apr 2013 at 12:20PM.)  MCV 94  MCH 32.4  MCHC 34.4  RDW  16.8   [place]    08-Oct-14 10:10, [place] Flow Doppler Low Extrem Left (Leg)  [place] Flow Doppler Low Extrem Left (Leg)   REASON FOR EXAM:    lle pain and swelling x2 weeks since surgery. +smoker  COMMENTS:       PROCEDURE: [place] - [place]orea[place]OPPLER LOW EXTR LEFT  - Apr 08 2013 10:10AM     RESULT: Comparison: None    Findings: Multiple longitudinal and transverse gray-scale as well as   color and spectral Doppler images of the left lower extremity veins were   obtained from the common femoral veins through the popliteal veins.    The common femoral vein, superficial femoral vein and popliteal veins are  noncompressible withno color-flow demonstrated at all venous levels most   concerning for occlusive deep venous thrombosis.  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 Corky Downs on 10 /8/ 2014 at 1010 hours.    Dictation Site: 1        Verified By: Jennette Banker, M.D., MD    Impression 1.  Left lower extremity deep vein thrombosis.  The patient will be started on IV heparin drip and subsequently Coumadin.  At the present time the patient has had some symptoms for about three weeks.  This reduces the effectiveness of lytic therapy significantly.  On exam her leg is swollen and painful but her toes are pink and her foot is warm.  I would continue heparin for now and reassess.  If her leg improves then conservative therapy is appropriate.  No final decision on intervention yet. 2.  Possible underlying malignancy. Consult Oncology, for  evaluation of possible underlying malignancy and also new blood clot in an ambulating patient suggesting hyper coagulable state.  3.  Diabetes. The patient takes metformin. At this time, we will just put her on sliding scale insulin, not knowing her kidney function.  4.  Tachycardia, likely due to pain. Will provide pain control with Norco and morphine as needed.  4.  Chronic obstructive pulmonary disease on 2 to 2.5 liters of oxygen at baseline requirement. She is at her baseline.  We will monitor her.  5.  Tobacco abuse. She was counseled for about 3 minutes. She denies any need for nicotine patch while in the hospital.   Plan level 3   Electronic Signatures: Hortencia Pilar (MD)  (Signed 09-Oct-14 10:57)  Authored: General Aspect/Present Illness, Home Medications, Allergies, History and Physical Exam, Vital Signs, Labs, Radiology, Impression/Plan   Last Updated: 09-Oct-14 10:57 by Hortencia Pilar (MD)

## 2014-10-22 NOTE — Consult Note (Signed)
PATIENT NAME:  NAVEAH, BRAVE MR#:  [id number] DATE OF BIRTH:  July 13, 1955  DATE OF CONSULTATION:  03/17/2013  REFERRING PHYSICIAN:   CONSULTING PHYSICIAN:  Chrisy Hillebrand A. Domitila Stetler, MD  REASON FOR CONSULTATION: Abdominal pain, nausea, vomiting and weight loss.   HISTORY OF PRESENT ILLNESS: Ms. Zupko is a pleasant 59 year old female with history of diabetes and emphysema, on 2.5 liters oxygen at home, who presents with approximately 3 days of worsening right lower quadrant pain. She said her pain initially began approximately 2 months ago and was up to a point where it was intolerable on Friday and presented to her doctor at [facility] and she was given p.o. Cipro and Flagyl without resolution. She has also had nausea and vomiting x 1 month, approximately 20 pound weight. Her last bowel movement was 4 days ago and was loose. She also has intermittent chills. Her blood sugars have been elevated. She takes insulin in the night and in the morning. Otherwise no fevers, chest pain, shortness of breath, cough, dysuria, or hematuria.  PAST MEDICAL HISTORY:  1.  Diabetes. 2.  COPD on 2.5 liters at home at baseline. 3.  Anxiety. 4.  Hyperlipidemia.  5.  Tobacco abuse. 6.  Sigmoid colon polyps.  7.  Back pain.   PAST SURGICAL HISTORY:  1.  Cholecystectomy. 2.  Colonoscopy in 2008.  ALLERGIES: No known drug allergies.  HOME MEDICATIONS: She says she is on  (Dictation Anomaly) insulin as well as Cipro and Flagyl.   SOCIAL HISTORY: Smokes 1/2 pack of cigarettes a day. Denies alcohol and drug use. Lives in [place]. Lives with a roommate.  FAMILY HISTORY: Mother with brain cancer. Father with stomach cancer. Multiple family members with diabetes. No history of coronary artery disease or hypertension.  REVIEW OF SYSTEMS:  A 12 point review was obtained and as above.  PHYSICAL EXAMINATION: VITAL SIGNS: Temperature 97.7, pulse 96, blood pressure 124/61, respiration 18, saturation 100%  on room air. GENERAL: No acute distress, alert and oriented x 3. HEENT: Head: Normocephalic, atraumatic. Eyes: No scleral icterus. No conjunctivitis. Face: No obvious facial trauma. Normal external nose. Normal extremity ears.  CHEST: Lungs clear to auscultation. Moves air well. HEART: Regular rate and rhythm. No murmurs, rubs, or gallops. ABDOMEN: Soft, nondistended. Tender in right lower quadrant.  GENITOURINARY: Has severe labial injection and swelling with exudate.  NEUROLOGIC: Cranial nerves II through XII grossly intact. Strength 5/5 in all 4 extremities.   LABORATORY AND DIAGNOSTICS:  Are significant for white cell count of 23.2, hemoglobin 13.4, hematocrit 40.5, platelets 387. Albumin 1.7, alk phos 144. BMP is remarkable for sodium of 130, chloride 92, BUN 17, creatinine of 0.6 and blood glucose 344.   CT scan shows intraabdominal free air but does have large amount of retroperitoneal fluid on the right side that appears to be loculated. She also has air in her bladder. The etiology of retroperitoneal fluid is completely unknown.   ASSESSMENT AND PLAN: Ms. Attridge is a pleasant 59 year old female with history of 2 months of abdominal pain worsening as well as subjective fevers and leukocytosis. She is to go to IR for percutaneous drainage of retroperitoneal fluid collection. She is not acutely ill and with her oxygen requirement would prefer to avoid surgery at this time. Will continue to watch output and will re-image in 2 to 3 days to evaluate collection well. Also, if it does not resolve with percutaneous drainage, we will consider operative drainage. ____________________________ Glena Norfolk Mabry Tift, MD cal:sb D: 03/17/2013  15:54:46 ET T: 03/17/2013 16:34:34 ET JOB#: [id number]  cc: Harrell Gave A. Tyria Springer, MD, <Dictator> Floyde Parkins MD ELECTRONICALLY SIGNED 03/21/2013 19:19

## 2014-10-22 NOTE — H&P (Signed)
PATIENT NAME:  April [name], April [name] MR#:  [id number] DATE OF BIRTH:  [birth year]  DATE OF ADMISSION:  03/17/2013  PRIMARY CARE PHYSICIAN: [facility]   REQUESTING PHYSICIAN: Lowella Fairy, MD  CHIEF COMPLAINT: Abdominal pain, nausea and vomiting.   HISTORY OF PRESENT ILLNESS: The patient is a 59 year old female with a known history of diabetes and emphysema on 2 to 2.5 liters oxygen at baseline who is being admitted for retroperitoneal abscess. The patient started having abdominal pain last Friday and has been not able to keep anything down orally. Every time she eats she throws up. She denies any fever. She has been having constant nausea and vomiting since last Friday and finally decided to come to the Emergency Department. While in the ED, she was found to have a retroperitoneal abscess on CT scan of the abdomen and pelvis and is being admitted for further evaluation and management.   PAST MEDICAL HISTORY: 1.  Diabetes.  2.  Anxiety. 3.  COPD 2 to 2.5 liters oxygen at baseline requirement. 4.  Hyperlipidemia.  5.  Tobacco abuse.  6.  Colonic polyps on colonoscopy in November 2008.   PAST SURGICAL HISTORY: None.   ALLERGIES: No known drug allergies.   HOME MEDICATIONS: 1.  Ciprofloxacin started 09/12 for total 7 days. 2.  Metoclopramide 10 mg 1/2 half to 1 tablet p.o. 4 times a day, started by her primary care physician.   SOCIAL HISTORY: She smokes 1/2 pack of cigarettes daily since she was age 16 years. No alcohol or drug abuse.   FAMILY HISTORY: Mother died of brain cancer at the age of 62.   REVIEW OF SYSTEMS: CONSTITUTIONAL: No fever, fatigue, weakness.  EYES: No blurred or double vision.  ENT: No tinnitus or ear pain.  RESPIRATORY: No cough, wheezing, hemoptysis.  CARDIOVASCULAR: No chest pain, orthopnea, edema. GASTROINTESTINAL: Positive for nausea, vomiting and abdominal pain. Her last bowel movement was 3 to 4 days ago, which she claims has been  normal.  GENITOURINARY: No dysuria or hematuria.  ENDOCRINE: No polyuria or nocturia.  HEMATOLOGY: No anemia or easy bruising.  SKIN: No rash or lesion.  MUSCULOSKELETAL: No arthritis or muscle cramp.  NEUROLOGIC: No tingling, numbness or weakness.  PSYCHIATRIC: No history of anxiety or depression.   PHYSICAL EXAMINATION: VITAL SIGNS: Temperature 97.7, heart rate 106 per minute, respirations 18 per minute, blood pressure 120/60 mmHg and she is saturating 98% on room air. GENERAL: The patient is a 59 year old female lying in the bed in acute pain.  EYES: Pupils are equal, round and reactive to light and accommodation. No scleral icterus. Extraocular muscles intact.  HENT: Head atraumatic, normocephalic. Oropharynx and nasopharynx clear.  NECK: Supple. No jugular venous distention. No thyroid enlargement or tenderness.  LUNGS: Clear to auscultation bilaterally. No wheezing, rales, rhonchi or crepitation.  HEART: S1 and S2 normal. No murmurs, rubs or gallops.  ABDOMEN: Soft. She has right lower quadrant tenderness with some guarding. No rigidity. No rebound tenderness. No organomegaly appreciated. Bowel sounds present. NEUROLOGIC: Cranial nerves III through XII intact. Muscle strength 5 out of 5 in all extremities. Sensation intact.  PSYCHIATRIC: The patient is alert and oriented x 3.  SKIN: No obvious rash, lesion or ulcer.   GENITOURINARY: Was performed with 2 female nurses being present as they were trying to get a Foley catheter inserted. She had a significant amount of yeast infection with some pain and tenderness around her genitals.  SKIN: She has presence of hirsutism signs with  hair in her mustache area and bilateral hands, mainly on the forearm areas.  LABORATORY AND DIAGNOSTICS: Normal BMP except sodium of 130, chloride 92 and blood sugar of 344. Liver function tests showed alkaline phosphatase of 144. Normal troponin of 0.02. Normal CBC except white count of 23.2. UA showed 9 WBCs,  1+ bacteria, 1+ leuk esterase.   Abdominal 3-way with PA of chest showed no evidence of bowel obstruction. No acute cardiopulmonary disease.   CT scan of the abdomen and pelvis with contrast showed retroperitoneal abscess possibly arising from the right colon, less likely from the lower pole of the right kidney. Marked distention of the urinary bladder with air-fluid level. Foley catheterization recommended. Possible that retroperitoneal fluid is communicating with urinary bladder. Moderate amount of stool in the transverse and left colon suggestive of constipation. No acute hepatobiliary abnormality. No evidence of pyelonephritis or obstruction of right kidney.   IMPRESSION AND PLAN: 1.  Retroperitoneal abscess, likely arising from right side of the bowel or lower pole of the right kidney. At this time, the case was discussed with surgeon, Dr. Juliann PulseLundquist, who recommended medical admission and no surgical intervention other than CT guided drainage for which I will place as CT drainage order. We will start her on IV Cipro and Flagyl. Certainly antibiotic can be broaden if needed. Will obtain 2 sets of blood cultures. 2.  Hyponatremia, likely due to dehydration. Will start on IV fluids and monitor.  3.  Urinary tract infection based on urinalysis. Will get urine culture and sensitivity. Continue Cipro which should cover this.  4.  Leukocytosis, likely due to urinary tract infection and retroperitoneal abscess. Will monitor.  5.  Chronic obstructive pulmonary disease on chronic 2.5 liters oxygen at baseline. This seems stable. We will monitor and put her on oxygen.  6.  Tobacco abuse. She was counseled for about 3 minutes. She is not ready to quit yet.  CODE STATUS: FULL CODE.  TOTAL TIME: Taking care of this patient was 55 minutes.  ____________________________ Ellamae SiaVipul S. Sherryll BurgerShah, MD vss:sb D: 03/17/2013 13:29:00 ET T: 03/17/2013 14:03:20 ET JOB#: [id number]  cc: Jayce Kainz S. Sherryll BurgerShah, MD, <Dictator> [facility]  [facility] A. Juliann PulseLundquist, MD Patricia PesaVIPUL S Kameren Pargas MD ELECTRONICALLY SIGNED 03/18/2013 15:47

## 2014-10-22 NOTE — Consult Note (Signed)
Brief Consult Note: Diagnosis: left leg DVT.   Discussed with Attending MD.   Comments: at the present time the patient has had some symptoms for about three weeks.  This reduces the effectiveness of lytic therapy significantly.  On exam her leg is swollen and painful but her toes are pink and her foot is warm.  I would continue heparin for now and reassess.  If her leg improves then conservative therapy is appropriate.  No final decision on intervention yet.  Electronic Signatures: Levora DredgeSchnier, Dwight Adamczak (MD)  (Signed 08-Oct-14 16:58)  Authored: Brief Consult Note   Last Updated: 08-Oct-14 16:58 by Levora DredgeSchnier, Temesgen Weightman (MD)

## 2014-10-22 NOTE — Discharge Summary (Signed)
PATIENT NAME:  April KillingsSHEPPARD, Jameika J MR#:  [id number] DATE OF BIRTH:  07/25/1955  DATE OF ADMISSION:  03/17/2013 DATE OF DISCHARGE:  03/20/2013  PRIMARY CARE PHYSICIAN: Dr. Tenny Crawoss  CONSULTATIONS: Surgery, Dr. Juliann PulseLundquist and Dr. Anda KraftMarterre; urology, Dr. Achilles Dunkope.  DISCHARGE DIAGNOSES: 1.  Retroperitoneal abscess.  2.  Urinary tract infection.  3.  Chronic obstructive pulmonary disease.  4.  Diabetes.  5.   Malnutrition 6.  Tobacco abuse.   CONDITION: Stable.   CODE STATUS: FULL CODE.  DISCHARGE MEDICATIONS: 1.  Metoclopramide 10 mg tablet 1/2 to 1 tablet 4 times a day before meals and at bedtime. 2.  Flagyl 500 mg p.o. every 8 hours for 14 days. 3.  Cipro 500 mg p.o. q. 12 hours for 14 days. 4.  Percocet 325/5 mg tablets q. 6 hours p.r.n. for pain.   DISCHARGE INSTRUCTIONS: The patient needs home health with physical therapy. The patient needs JP drainage care. The patient also has home oxygen at 2.5 liters by nasal cannula.   DIET: ADA.  ACTIVITY: As tolerated.   FOLLOW-UP CARE: Follow up with PCP within 1 to 2 weeks. Follow up with Dr. Anda KraftMarterre within 1 week for JP drainage re-evaluation. The patient needs antibiotics, Flagyl and Cipro, for 2 weeks, according to Dr. Sharmon RevereLindquist.   REASON FOR ADMISSION: Abdominal pain, nausea and vomiting.   HOSPITAL COURSE: The patient is a 59 year old Caucasian female with a history of diabetes, emphysema and COPD, on 2.5 liters home oxygen, who developed abdominal pain, nausea and vomiting. CAT scan of the abdomen in the ED showed retroperitoneal abscess. The patient was admitted for retroperitoneal abscess. For detailed history and physical examination, please refer to the admission note dictated by Dr. Sherryll BurgerShah. On admission date, the patient's laboratory data is as follows: Normal BMP except sodium 130. The patient's blood sugar was 344. WBC 23.2. Urinalysis showed 9 WBC, 1+ bacteria. Troponin 0.02. After admission, the patient has been treated with Cipro and  Flagyl IVPB. Dr. Juliann PulseLundquist saw the patient and suggested CT-guided drainage. After the procedure, the patient had a lot of bloody, cloudy drainage from the JP tube. The patient's white count decreased to 10.2, sodium increased to 136. The patient still has some abdominal pain, but she tolerated food well. I discussed with Dr. Anda KraftMarterre. He suggested the patient can be discharged to home with home health. The patient needs to follow up with him as outpatient. Because the patient has no weakness, we requested PT evaluation who suggested the patient needs home health and home physical therapy.   Diabetes has been treated with sliding scale. We will add metformin 500 mg p.o. b.i.d.   The patient is clinically stable and will be discharged to home with home health and PT today. I discussed the patient's discharge plan with Dr. Anda KraftMarterre, case manager, nurse and also called the patient's niece who is the next of kin. I discussed the patient's discharge plan with her.   TIME SPENT: About 42 minutes.  ____________________________ Shaune PollackQing Lailany Enoch, MD qc:sb D: 03/20/2013 15:58:10 ET T: 03/20/2013 16:16:42 ET JOB#: [id number]  cc: Shaune PollackQing Devetta Hagenow, MD, <Dictator> Shaune PollackQING Sierah Lacewell MD ELECTRONICALLY SIGNED 03/24/2013 11:21

## 2014-10-22 NOTE — H&P (Signed)
PATIENT NAME:  April [name], BARNER MR#:  [id number] DATE OF BIRTH:  23-Oct-1955  DATE OF ADMISSION:  04/08/2013  [facility] PHYSIC[facility]: [facility]  REQUESTING PHYSICIAN:  Dr. Jene Every.   CHIEF COMPLAINT: Left lower extremity deep vein thrombosis.   HISTORY OF PRESENT ILLNESS: The patient is a 59 year old female with a known history of diabetes, anxiety, COPD with 2 to 2.5 liter oxygen at baseline requirement, is being admitted for new onset left lower extremity DVT. The patient was here in the hospital from 03/17/2013 until 03/20/2013 when she had a retroperitoneal abscess and urinary retention for which Foley catheter was placed.  The patient was scheduled to get her Foley removed yesterday and she missed her urology appointment with Dr. Achilles Dunk this Monday and she came to the ED to get her Foley removed. While in the ED she also reported left lower extremity pain and swelling, ongoing for the last 2 to 3 weeks but was ignoring it as the swelling was going up and down, and she was walking but due to her ongoing concerns, she underwent lower extremity Doppler which showed extensive DVT from femoral to popliteal vein with occlusive nature.   She is complaining of a lot of left lower extremity pain.   She is also hungry.   PAST MEDICAL HISTORY: 1.  Diabetes. 2.  Anxiety. 3.  COPD on 2 to 2.5 liters oxygen at baseline. 4.  Hyperlipidemia. 5.  Tobacco abuse.    6.  Colonic polyp on colonoscopy on November 2008.   PAST SURGICAL HISTORY: None.   ALLERGIES: No known drug allergies.   MEDICATIONS AT HOME: 1.  Metoclopramide 10 mg p.o. 4 times a day as needed.  2.  Metformin 500 mg p.o. b.i.d.   SOCIAL HISTORY: She smokes one half pack of cigarettes daily since she was age 23. No alcohol. No drug use.   FAMILY HISTORY: Mother died of brain cancer at the age of 65. Father had heart disease.   REVIEW OF SYSTEMS: CONSTITUTIONAL: No fever, fatigue, weakness. Positive for pain in her  left lower extremity. She denies any weight changes. Her appetite is good.  EYES:  No blurry of double vision.  ENT: No tinnitus or ear pain.  RESPIRATORY: No cough or hemoptysis.  CARDIOVASCULAR: No chest pain, orthopnea. Positive for left lower extremity edema.  GASTROINTESTINAL: No nausea, vomiting, diarrhea.  GENITOURINARY: No dysuria or hematuria. She did have urinary retention for which she required a Foley on last admission, which was removed here in the Emergency Department today.  ENDOCRINE: No polyuria or nocturia.  HEMATOLOGY: No anemia or easy bruising. She does have left lower extremity deep vein thrombosis which is new. MUSCULOSKELETAL: No arthritis or muscle cramp.  NEUROLOGIC: No tingling, numbness, weakness.  PSYCHIATRIC: No history of anxiety or depression.   PHYSICAL EXAMINATION: VITAL SIGNS: Temperature 97.8, heart rate 109 per minute, respirations 20 per minute, blood pressure 126/55 mmHg, saturating 98% on room air.  GENERAL:  The patient is a 59 year old female lying in the bed comfortably without any acute distress.  EYES: Pupils equal, round, reactive to light and accommodation. No scleral icterus. Extraocular muscles intact.  HEENT:  Head atraumatic, normocephalic. Oropharynx and nasopharynx clear.  NECK: Supple.  No jugular venous distention. No thyroid enlargement or tenderness.   LUNGS: Clear to auscultation bilaterally. No wheezing, rales, rhonchi or crepitation. CARDIOVASCULAR: S1, S2 normal. No murmurs, rubs or gallops. ABDOMEN: Soft, nontender, nondistended. Bowel sounds present. No organomegaly.  EXTREMITIES:  She does  have trace left lower extremity edema but her skin is very shiny and she does have some tenderness in the calf muscles. No signs of erythema. No cyanosis, clubbing. PSYCHIATRIC:  The patient is alert and oriented x 3.  SKIN: No obvious rash, lesion or ulcer.  NEUROLOGIC: Cranial nerves II through XII are intact.  Muscles 5 out of 5 in all  extremities. Sensation intact.   LABORATORY PANEL: Labs are pending.   Her left lower extremity Doppler showed extensive occlusive DVT in the left lower extremity extending from femoral vein to the popliteal vein.   IMPRESSION AND PLAN: 1.  New-onset left lower extremity deep vein thrombosis.  The patient will be started on IV heparin drip and subsequently can be considered for Coumadin or any other new anticoagulant depending on their preference and appropriateness.  We will consult Vascular Surgery at this time for any intervention and further assessment.  2.  We will also consult Oncology, for evaluation and possible underlying malignancy and also new blood clot in an ambulating physician.  3.  Diabetes. The patient takes metformin. At this time, we will just put her on sliding scale insulin, not knowing her kidney function.  4.  Tachycardia, likely due to pain. Will provide pain control with Norco and morphine as needed.  4.  Chronic obstructive pulmonary disease on 2 to 2.5 liters of oxygen at baseline requirement. She is at her baseline.  We will monitor her.  5.  Tobacco abuse. She was counseled for about 3 minutes. She denies any need for nicotine patch while in the hospital.    Total time taking care of this patient: 55 minutes. [facility]   Please note, the patient was counseled for about 3 minutes for tobacco abuse.    ____________________________ Ellamae SiaVipul S. Sherryll BurgerShah, MD vss:dp D: 04/08/2013 11:42:30 ET T: 04/08/2013 12:00:02 ET JOB#: [id number]  cc: Champ Keetch S. Sherryll BurgerShah, MD, <Dictator> [facility] Ellamae SiaVIPUL S [facility] MD ELECTRONICALLY SIGNED 04/10/2013 13:02

## 2014-10-22 NOTE — Consult Note (Signed)
Patient seen, chart reviewed, films reviewed, note dictated. Pneumaturia, urinary retention, retroperitoneal abscess  Recommendation: The retroperitoneal abscess does not appear to be directly related to the urinary tract.  The air in the bladder could certainly be from bacteria.  There is also a chance for fistula formation to the abscess cavity.  A CT cystogram will be arranged for further evaluation from this aspect.  With otherwise recommend leaving the Foley catheter for approximately 5 days.  Once the abscess is drained, she should be able to void much more freely.  The blatter emptying was more likely related to the abscess itself.  The renal function is excellent.  There is no evidence of hydronephrosis.  If a fistula to the abscess cavity is identified, spontaneous closure with drainage of the abscess and Foley catheter placement would be all that is indicated.  She would need cystoscopy at some point in the future if this were identified.  I will continue to follow with you.  Will make further recommendations as indicated.  Electronic Signatures: Smith Robertope, Samariah Hokenson S (MD)  (Signed on 16-Sep-14 17:36)  Authored  Last Updated: 16-Sep-14 17:36 by Smith Robertope, Nilda Keathley S (MD)

## 2014-10-22 NOTE — Discharge Summary (Signed)
PATIENT NAME:  April [name], April [name] MR#:  [id number] DATE OF BIRTH:  07/14/1955  DATE OF ADMISSION:  03/17/2013 DATE OF DISCHARGE:  03/21/2013  Addendum  The patient was not discharged as it was previously planned on 03/20/2013 since she had significant and acute urinary retention after her Foley catheter was discontinued at around 3 p.m. on 03/20/2013.  The patient was initiated on in and out catheterization, however even with that the patient's urinary retention remains significant.  Her bladder scan done every eight hours despite the patient being able to void and expelling some urine from her bladder still remained at above 800.  We made decision to reinitiate Foley catheter again felt that the patient would benefit from follow-up with Dr. Achilles Dunk in the next one week after discharge.  Acute urinary retention remains of unclear etiology at this point, however we felt that the patient would not be able to have in and out catheterization performed at home since the patient has been having significant (Dictation Anomaly) labial,  perineal swelling and it is difficult even for nurses to place a Foley catheter.  Initially, we felt that the patient will need to have rehabilitation placement because of this condition, however upon discussion with social worker it does appear that in fact she did quite satisfactorily with physical therapy and it would be uphill battle with insurance company to quantify her for rehabilitation placement at this point, so decision was made to place Foley catheter again and get home health nurse to teach patient's family how to manage patient's Foley catheter as well as care for the bag.  As mentioned above, the patient is to follow up with the urologist Dr. Achilles Dunk in the next one week after discharge for further recommendations.  We did not repeat any urinalysis or cultures at this point since the patient is already on antibiotic therapy with ciprofloxacin.    In regards to access drainage  catheter, the patient's drainage catheter will be managed again with the help of patient's family and the patient's family will learn from day nurse to flush drainage catheter with 10 mL of normal saline solution every eight hours.  The patient's family was advised not to aspirate fluid afterwards and just allow it to drain to the Foley leg bag.  The patient is to follow up with Dr. Juliann Pulse or Dr. Anda Kraft in the next one week after discharge for management of her access and drainage catheter.  The patient is to continue therapy with Flagyl as well as ciprofloxacin for perforated appendix with abscess formation.  The patient is to have colonoscopy done as an outpatient whenever she heals as per prior recommendations by surgery.   In regards to her chronic medical problems such as diabetes mellitus, the patient is to continue metformin.    In regards to urinary tract infection, the patient's urine cultures came back positive for E. coli, sensitive to cefazolin, ampicillin, ceftriaxone as well as ciprofloxacin and gentamicin, ceftazidime, imipenem as well as levofloxacin, however resistant to trimethoprim sulfamethoxazole.  The patient's urine cultures came back negative for any growth in 36 hours.  The patient's retroperitoneal abscess wound culture came back positive for E. coli resistant to trimethoprim sulfamethoxazole, sensitive to all other antibiotics.  Blood cultures also were negative on 03/17/2013.  It was felt, however, that the patient very likely had urinary tract infection, however urologist disputed this issue.  I still feel due to concerns of urinary retention that patient would benefit from a cystogram or urodynamic studies  as outpatient.  The patient is being discharged in stable condition with above-mentioned medications and follow-up with home health R.N. as well as physical therapy.    Her vital signs on the day of discharge:  Temperature was 98.7, pulse was 84, respiration rate was 18,  blood pressure 110/74, saturation was 96% on room air at rest.  Due to patient's chronic respiratory failure, the patient is to continue oxygen therapy as previously scheduled at 2 to 2-1/2 liters of oxygen through nasal cannula as needed.    ____________________________ Katharina Caperima Gabbriella Presswood, MD rv:ea D: 03/21/2013 16:57:01 ET T: 03/21/2013 17:22:50 ET JOB#: [id number]  cc: Katharina Caperima Ilynn Stauffer, MD, <Dictator> Gardenia Witter MD ELECTRONICALLY SIGNED 04/06/2013 11:08

## 2014-10-22 NOTE — Discharge Summary (Signed)
PATIENT NAME:  April [name], Stevi J MR#:  [id number] DATE OF BIRTH:  1955/09/23  PRIMARY CARE PHYSICIAN: [facility].   FINAL DIAGNOSES:  1.  Deep vein thrombosis, left lower extremity, with pain.  2.  Diabetes, uncontrolled.  3.  Tachycardia.  4.  Chronic obstructive pulmonary disease.   MEDICATIONS ON DISCHARGE: Include metformin 500 mg twice a day, oxycodone 5 mg every four hours as needed for pain, glipizide XL 5 mg one tablet daily, Colace 100 mg twice a day as needed for constipation, Xarelto 15 mg one capsule twice a day with meals for 20 days. After 20 days, then the prescription changes to 20 mg oral daily for another 5 months.   HOME HEALTH: Physical therapy, nurse and social work. Help with meds and strength.   DIET: Low carbohydrate-controlled diet, low-sodium diet.   ACTIVITY: As tolerated.   FOLLOWUP: [facility].   HOSPITAL COURSE: The patient was admitted with lower extremity DVT.   HISTORY OF PRESENT ILLNESS: A 59 year old female with diabetes, anxiety, COPD, admitted with left lower extremity DVT. Recent hospitalization for retroperitoneal abscess and urinary retention. She had new-onset DVT. The patient was started on a heparin drip. Vascular surgery and oncology were consulted.   LABORATORY AND RADIOLOGICAL DATA: Included an ultrasound of the left lower extremity that showed extensive DVT, left lower extremity. White blood cell count 9.1, H and H  12.8 and 37.2, platelet count of 227, glucose 300. BUN 10, creatinine 0.57, sodium 136, potassium 4.2, chloride 102, CO2 31, calcium 8.7. Liver function tests: Albumin low at 2.4.   Chest x-ray showed no acute cardiopulmonary disease.   Hemoglobin A1c 12.6.   Initial PT 15.0, INR 1.2, PTT 30.6.   Sugar went up to 519. Sugar upon discharge 219. Hemoglobin 11.   HOSPITAL COURSE PER PROBLEM LIST:  1.  For the DVT of left lower extremity with pain, the patient was initially  started on heparin drip. Dr. Gilda CreaseSchnier saw the patient in consultation and said no intervention at this time. Dr. Lorre NickGittin also saw the patient in consultation and sent off some hypercoagulable work-up. I switched the patient over to Xarelto. It was approved by the insurance company. The patient will take Xarelto 15 mg twice a day for 20 more days and then be switched over to a 20 mg pill once a day at 5:00 p.m. for another 5 months. Benefits and risks were explained to this medication to the patient and to the patient's caretaker. Her name is April [name]. I did explain that the benefits are to treat the blood clot and prevent pulmonary embolism and death. Risks are bleeding from any site and if she falls and hits her head, she could bleed into the brain and that could cause death. The patient and caretaker understand the risks of this medication. The patient was also given oxycodone for pain. The pain should lessen as blood thinner continues to treat blood clot.  2.  Diabetes, uncontrolled. I did start low-dose glipizide XL. The patient is already on metformin.  3.  Tachycardia, likely secondary to pain, improved.  4.  COPD. The patient does look older than stated age. She is only 59 years old and looks much older than that. Respiratory status was stable during the hospitalization. No changes in management made.   TIME SPENT ON DISCHARGE: 35 minutes.    ____________________________ Herschell Dimesichard J. Renae GlossWieting, MD rjw:np D: 04/10/2013 17:38:57 ET T: 04/10/2013 19:32:24 ET  JOB#: [id number]  cc: Herschell Dimes. Renae Gloss, MD, <Dictator> Phineas Real [facility] MD ELECTRONICALLY SIGNED 04/20/2013 10:14

## 2014-10-23 NOTE — Discharge Summary (Signed)
PATIENT NAME:  April [name], April [name] DATE OF BIRTH:  27-Mar-1956  DATE OF ADMISSION:  08/21/2013 DATE OF DISCHARGE:  08/25/2013  ADMITTING DIAGNOSIS:  Deep vein thrombosis with thrombophlebitis.   DISCHARGE DIAGNOSES:  1.  Left lower extremity pain. 2. Left lower extremity deep vein thrombosis progressing with great saphenous vein thrombophlebitis, now on Lovenox and Coumadin bridge.  Followup with Dr. Wendie Simmeramiah was recommended.  3.  Peripheral artery disease.  Followup with Dr. Wyn Quakerew was recommended.  4.  Diabetes mellitus.  His hemoglobin A1c is 11.8, now on insulin therapy.  5.  Tobacco abuse, ongoing.  6.  Chronic obstructive pulmonary disease exacerbation. 7.  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  Followup with Dr. Ernest PineHooten is recommended.   DISCHARGE CONDITION:  Stable.   DISCHARGE MEDICATIONS:  The patient is to continue: 1.  Oxycodone 5 mg every 4 hours as needed. 2.  Glipizide XL 5 mg p.o. daily. 3.  Docusate sodium 100 mg p.o. twice daily as needed. 4.  Metoclopramide 10 mg daily. 5.  Simvastatin 10 mg p.o. at bedtime. 6.  Percocet 10/325 mg 1 tablet every 6 hours as needed. 7.  Albuterol 2.5 mg in 3 mL inhalation solution twice daily. 8.  Acetaminophen oxycodone 325/10 mg 1 tablet every 6 hours as needed.   9.  Warfarin 1 mg half tablet once a day for the next 3 days, then have [facility] Dr. Wendie Simmeramiah.   10.  Enoxaparin, which is Lovenox 60 mg subcutaneously once daily until the patient's ProTime now is 2.0 and above.   11.  Insulin aspart 7 units subcutaneously 3 times daily before meals. 12.  Insulin detemir 10 units subcutaneously at bedtime.   13.  Nicotine transdermal film 21 mg topically daily.   14.  Senna 8.6 mg p.o. at bedtime.    The patient is not to take metformin or Xarelto.   HOME OXYGEN:  None.   DIET:  2 gram salt, low-fat, low-cholesterol, carbohydrate-controlled diet, regular  consistency.  ACTIVITY LIMITATIONS:  As tolerated.   FOLLOWUP APPOINTMENTS:  Dr. Marval Regalom Ross in 2 days after discharge, Dr. Wend[facility]amiah [facility]th[facility] Pro[facility], Dr. Wyn Quakerew in 1 to 2 weeks after discharge, as well as Dr. Ernest PineHooten in 2 weeks after discharge.    CONSULTANTS: Dr. Wendie Simmeramiah, care management, social work, Dr. Wyn Quakerew.  RADIOLOGIC STUDIES:  CT angiogram for pulmonary embolism August 21, 2013, showed no pulmonary embolus, multiple pulmonary nodules as described.  If the patient is at high risk for bronchogenic carcinoma.  Followup with chest CT at 3 to 6 months was recommended.  If the patient is at low risk for bronchogenic carcinoma, followup chest CT in 6 to 12 months is recommended.  Doppler ultrasound, left lower extremity ultrasound, August 21, 2013, revealing evidence of prior left lower extremity DVT with nonocclusive chronic neural thrombus identified in the common femoral vein and femoral vein.  This was not causing significant luminal narrowing, and there is no definite acute DVT identified.  Superficial thrombophlebitis of great saphenous vein.  This may be more acute and symptomatic according to radiology.  CT angiogram iliofemoral runoff August 23, 2013, showing age-advanced severe mixed calcified and noncalcified irregular atherosclerotic plaque with primarily the infrarenal abdominal aorta resulting in at least 60% luminal narrowing in this study.  No evidence of abdominal aortic dissection or periaortic stranding, predominantly calcified atherosclerotic plaque within the bilateral  common iliac arteries results in approximately 50% luminal narrowing bilaterally, multifocal tandem areas with hemodynamically significant narrowing throughout the right superficial femoral and above-knee popliteal arteries.  Three vessel runoff to the right foot.  The right-sided dorsalis pedis artery is patent to the level of the forefoot.  The right-sided dorsalis pedis  artery is patent to the level of the forefoot.  Multifocal tandem areas of hemodynamically significant narrowing throughout the left superficial femoral artery and above-knee popliteal arteries.  Three vessel runoff to the left foot.  Short segmental occlusion from the proximal aspect of the left dorsalis pedis artery with early reconstitution, suspected to have a hemodynamically significant narrowing approximately 50% of the origin of the right renal artery without associated delayed right-sided renal enhancement or asymmetric right-sided renal atrophy.  Incidentally noted suspect area of fibrodysplasia involving the proximal femoral diaphysis, indeterminate subcentimeter approximately 0.9 cm area of hyper-enhancement adjacent to the subcapsular medial aspect of the posterior segment of the right lobe of the liver.  It is unclear if this structure is located within the hepatic parenchyma or associated with adjacent right adrenal gland.  Regardless, it is likely too small to adequately characterize given small size.  Further evaluation could be performed with followup abdominal MRI in 6 months, as clinically indicated.  PET CT scan, initial staging, revealed scattered small pulmonary nodules in the lungs, are multi-hypermetabolic, but are the low reliable PET CT size thresholds, low negative for predicted value.  Six month followup CT of the chest is recommended to reassessment on the size of these scattered nodules.  Bilateral airway thickening favoring bronchitis.  No reactive airway disease.  The lack of thoracic adenopathy and lack of high activity makes sarcoidosis less likely.  Atherosclerosis.  Prominent stool throughout the colon favors constipation, focus of suspected fibrodysplasia in the proximal right femur was also noted.   HOSPITAL COURSE:  The patient is a 59 year old Caucasian female with a history of tobacco abuse, ongoing, who presents to the hospital with left lower extremity pain as well as  swelling.  Please refer to Dr. Serita Grit Patel's admission on 08/21/2013.  Apparently, the patient was hospitalized in October with DVT, was started on Xarelto, was discharged home.  Now she comes back with worsening left leg pain.  On arrival to the hospital, the patient's temperature was 97.5, pulse was 97, respiration rate was 22, blood pressure 125/65, O2 sats were 98% on room air.  Physical exam revealed some discomfort and pain on palpation of her left lower extremity.  An ultrasound revealed prior lower extremity DVT with nonocclusive chronic mural thrombus and new great saphenous vein superficial thrombophlebitis, which was likely acute.  The patient was admitted to the hospital for further evaluation and started on heparin IV, and Xarelto was stopped.  Consultation with oncologist was obtained due to the patient's hypercoagulable state.  Oncologist, Dr. Wendie Simmer, felt the patient would benefit from whole-body PET CT scanning, which was performed on August 25, 2013.  It, however, did not show any hypermetabolic areas of concern, but did reveal focus of proximal right femur fibrodysplasia as well as small pulmonary nodularities which need to be reassessed in 6 months.  The patient was also evaluated for peripheral artery disease and underwent iliofemoral runoff, which showed significant disease in the left lower extremity, which was also likely cause of patient's significant pain.  Consultation with Dr. Wyn Quaker was obtained.  After work-up, the patient was advised to continue Lovenox as well as Coumadin bridge.  She is to  follow up with Dr. Wendie Simmer in the next few days after discharge to assess the patient's ProTime level and then make decisions about Coumadin dosing.    In regards to peripheral vascular disease, the patient is to follow up with Dr. Wyn Quaker in the next few days after discharge for further recommendations and possible angiogram.  The patient was also counseled about tobacco abuse.  I discussed the  patient's case in regards to right proximal femur fibrodysplasia, which was seen on PET CT scan with Dr. Ernest Pine, who recommended followup with him as well.  The patient is being discharged in stable condition with the above-mentioned medications and followup.  On the day of discharge, temperature was 97.7, pulse was 85, respiration rate was 20, blood pressure 110/70, O2 sats were 98% on room air at rest.   TIME SPENT:  40 minutes.    ____________________________ Katharina Caper, MD rv:ea D: 08/26/2013 20:23:00 ET T: 08/26/2013 23:44:54 ET JOB#: [id number]  cc: Demetrius Charity. Wendie Simmer, MD Annice Needy, MD Illene Labrador. Angie Fava., MD Dr. Lodema Hong, MD, <Dictator>    Amiel Sharrow MD ELECTRONICALLY SIGNED 09/03/2013 18:46

## 2014-10-23 NOTE — Consult Note (Signed)
History of Present Illness:  Reason for Consult Recurrent thrombosiis on Xarelto   HPI   The patient is a 59 year old Caucasian female with previous history of DVT, who was hospitalized here in October. Prior to that she had a rectal peritoneal abscess and was in the hospital and then was discharged home, returned back with swelling in the legs and was noted to have left lower extremity DVT, so she was discharged home on Xarelto. After the patient remained on Xarelto.  She continues to take Xarelto.  However, over the past 2 weeks she started having pain in the left leg. She describes the pain as sharp and as going all the way from her legs to her thighs. She could not sleep. The pain continued to persist; therefore, she came to the ED. In the ER, she was evaluated with a repeat Doppler ultrasound which showed evidence of prior left lower extremity DVT with nonocclusive chronic mural thrombus and then there was a superficial thrombophlebitis of the graft and great saphenous vein. Which because it was superficial, we asked the ED physician to speak to Dr. Delana Meyer who reviewed the ultrasound and he felt that this is extending up higher and patient therefore admitted and started on heparin ands coumadin  PFSH:  Family History noncontributory   Social History positive alcohol   Review of Systems:  General fatigue   Performance Status (ECOG) 2   HEENT no complaints   Lungs SOB   Cardiac no complaints   GI no complaints   GU no complaints   Musculoskeletal no complaints   Skin no complaints   Neuro no complaints   Endocrine no complaints   Psych no complaints   NURSING NOTES: ED Vital Sign Flow Sheet:   20-Feb-15 13:28   Temp Temperature: 97.5   Temp Source: oral   Pulse Pulse: 97   Respirations Respirations: 22   SBP SBP: 127   DBP DBP: 65   Pulse Ox % Pulse Ox %: 98   Pulse Ox Source Source: Room Air   Pain Scale (0-10) Pain Scale (0-10): Scale:9  NURSING  NOTES: **Vital Signs.:   21-Feb-15 14:33   Vital Signs Type: Routine   Temperature Temperature (F): 97.4   Celsius: 36.3   Temperature Source: oral   Pulse Pulse: 79   Respirations Respirations: 20   Systolic BP Systolic BP: 88   Diastolic BP (mmHg) Diastolic BP (mmHg): 57   Mean BP: 67   Pulse Ox % Pulse Ox %: 94   Pulse Ox Activity Level: At rest   Oxygen Delivery: Room Air/ 21 %    Eggs: N/V/Diarrhea, Itching  Laboratory Results: Thyroid:  21-Feb-15 05:39   Thyroid Stimulating Hormone 1.58 (0.45-4.50 (International Unit)  ----------------------- Pregnant patients have  different reference  ranges for TSH:  - - - - - - - - - -  Pregnant, first trimetser:  0.36 - 2.50 uIU/mL)  Routine Chem:  21-Feb-15 05:39   Glucose, Serum  264  BUN 17  Creatinine (comp) 0.70  Sodium, Serum 138  Potassium, Serum 4.0  Chloride, Serum 105  CO2, Serum 29  Calcium (Total), Serum  8.2  Anion Gap  4  Osmolality (calc) 286  eGFR (African American) >60  eGFR (Non-African American) >60 (eGFR values <31m/min/1.73 m2 may be an indication of chronic kidney disease (CKD). Calculated eGFR is useful in patients with stable renal function. The eGFR calculation will not be reliable in acutely ill patients when serum creatinine is changing rapidly. It  is not useful in  patients on dialysis. The eGFR calculation may not be applicable to patients at the low and high extremes of body sizes, pregnant women, and vegetarians.)  Hemoglobin A1c ([facility])  11.8 (The American Diabetes Association recommends that a primary goal of therapy should be <7% and that physicians should reevaluate the treatment regimen in patients with HbA1c values consistently >8%.)  Routine Coag:  21-Feb-15 05:39   Activated PTT (APTT)  61.2 (A HCT value >55% may artifactually increase the APTT. In one study, the increase was an average of 19%. Reference: "Effect on Routine and Special Coagulation Testing  Values of Citrate Anticoagulant Adjustment in Patients with High HCT Values." American Journal of Clinical Pathology 2006;126:400-405.)  Routine Hem:  21-Feb-15 05:39   WBC (CBC) 6.6  RBC (CBC) 4.42  Hemoglobin (CBC) 14.1  Hematocrit (CBC) 42.5  Platelet Count (CBC) 177  MCV 96  MCH 31.8  MCHC 33.1  RDW 13.7  Neutrophil % 32.4  Lymphocyte % 50.4  Monocyte % 13.0  Eosinophil % 3.8  Basophil % 0.4  Neutrophil # 2.1  Lymphocyte # 3.3  Monocyte # 0.9  Eosinophil # 0.2  Basophil # 0.0 (Result(s) reported on 22 Aug 2013 at 06:17AM.)   Radiology Results: LabUnknown:    20-Feb-15 18:30, CT [facility] Chest with for PE  PACS Image    Assessment and Plan: Impression:     Patient with history of rectal abscess, immbolisation with DVT Oct 2014 and now with superficial thrombophlebitis. Plan:   Agree with Heparin and coumadin.Unclear reason for Xarelto Failure. May need to be discharged on Lovenox if failed Xarelto.for malignancy related thromophlebitis.recommend PET CT.nodules on CT - will require ffup CT in 3 months.re smoking cessation  Electronic Signatures: Georges Mouse (MD)  (Signed 21-Feb-15 15:01)  Authored: HISTORY OF PRESENT ILLNESS, PFSH, ROS, NURSING NOTES, ALLERGIES, LABS, OTHER RESULTS, ASSESSMENT AND PLAN   Last Updated: 21-Feb-15 15:01 by Georges Mouse (MD)

## 2014-10-23 NOTE — Consult Note (Signed)
CHIEF COMPLAINT and HISTORY:  Subjective/Chief Complaint left leg pain   History of Present Illness Patient with a previous history of DVT about 6 months ago admitted with LLe pain.  Had venous duplex that shows acute SVT of left GSV and chronic appearing thrombus in the deep veins.  She has a long history of tobacco use which is heavy and a CTA was done to assess for PAD as a cause of her pain.  I have reviewed this CTA.  There is aortoiliac disease present, although this does not appear particularly high grade.  There is some infrainguinal stenosis in the left SFA and popliteal region as well.  Does not appear to be a CTO.  We are asked to evaluate.  Pain is at rest.  Some better with dependency.  Does have leg pain with activity chronically.  No ulcers or infection present.   PAST MEDICAL/SURGICAL HISTORY:  Past Medical History:   DVT - Deep Vein Thrombosis:    Anemia:    Emphysema:    back pain:    anxiety:    diabetes:    Denies surgical history.:   ALLERGIES:  Allergies:  Eggs: N/V/Diarrhea, Itching  HOME MEDICATIONS:  Home Medications: Medication Instructions Status  GlipiZIDE XL 5 mg oral tablet, extended release 1 tab(s) orally once a day Active  docusate sodium 100 mg oral capsule 1 cap(s) orally 2 times a day, As needed, constipation Active  rivaroxaban 15 mg oral tablet 1 tab(s) orally 2 times a day (with meals) x 20 days (after 20 days then prescription changes to 52m orally daily for another 520month Active  oxyCODONE 5 mg oral tablet 1 tab(s) orally every 4 hours, As needed, pain Active  metFORMIN 500 mg oral tablet 1 tab(s) orally 2 times a day Active  metoclopramide 10 mg oral tablet  orally  Active  simvastatin 10 mg oral tablet 1 tab(s) orally once a day (at bedtime) Active  Percocet 10/325 325 mg-10 mg oral tablet 1 tab(s) orally every 6 hours, As Needed - for Pain Active   Family and Social History:  Family History Non-Contributory   Social History  positive  tobacco (Current within 1 year), negative ETOH, 2 ppd   Place of Living Home   Review of Systems:  Fever/Chills No   Cough Yes   Sputum No   Abdominal Pain No   Diarrhea No   Constipation No   Nausea/Vomiting No   SOB/DOE No   Chest Pain No   Telemetry Reviewed NSR   Dysuria No   Tolerating PT Yes   Tolerating Diet Yes   Medications/Allergies Reviewed Medications/Allergies reviewed   Physical Exam:  GEN thin, disheveled   HEENT pink conjunctivae, moist oral mucosa   NECK No masses  trachea midline   RESP normal resp effort  no use of accessory muscles   CARD regular rate  No LE edema  no JVD   VASCULAR ACCESS none   ABD denies tenderness  soft  normal BS   GU no superpubic tenderness   LYMPH negative neck, negative axillae   EXTR negative cyanosis/clubbing, negative edema, right DP/PT both 1+.  Left AT NP, left PT 1+   SKIN normal to palpation, No ulcers, skin turgor good   NEURO cranial nerves intact, motor/sensory function intact   PSYCH alert, A+O to time, place, person   LABS:  Laboratory Results: Thyroid:    21-Feb-15 05:39, Thyroid Stimulating Hormone  Thyroid Stimulating Hormone 1.58  0.45-4.50  (International [facility])   -----------------------  Pregnant patients have   different reference   ranges for TSH:   - - - - - - - - - -   Pregnant, first trimetser:   0.36 - 2.50 uIU/mL  LabObservation:    20-Feb-15 14:50, [place] Color Flow Doppler Low Extrem Left (Leg)  OBSERVATION   Reason for Test History of DVT  Routine Chem:    20-Feb-15 92:42, Basic Metabolic Panel (w/Total Calcium)  Glucose, Serum 394  BUN 16  Creatinine (comp) 0.81  Sodium, Serum 138  Potassium, Serum 4.4  Chloride, Serum 104  CO2, Serum 31  Calcium (Total), Serum 8.6  Anion Gap 3  Osmolality (calc) 293  eGFR (African American) >60  eGFR (Non-African American) >60  eGFR values <62m/min/1.73 m2 may be an indication of chronic  kidney disease  (CKD).  Calculated eGFR is useful in patients with stable renal function.  The eGFR calculation will not be reliable in acutely ill patients  when serum creatinine is changing rapidly. It is not useful in   patients on dialysis. The eGFR calculation may not be applicable  to patients at the low and high extremes of body sizes, pregnant  women, and vegetarians.    21-Feb-15 068:34 Basic Metabolic Panel (w/Total Calcium)  Glucose, Serum 264  BUN 17  Creatinine (comp) 0.70  Sodium, Serum 138  Potassium, Serum 4.0  Chloride, Serum 105  CO2, Serum 29  Calcium (Total), Serum 8.2  Anion Gap 4  Osmolality (calc) 286  eGFR (African American) >60  eGFR (Non-African American) >60  eGFR values <629mmin/1.73 m2 may be an indication of chronic  kidney disease (CKD).  Calculated eGFR is useful in patients with stable renal function.  The eGFR calculation will not be reliable in acutely ill patients  when serum creatinine is changing rapidly. It is not useful in   patients on dialysis. The eGFR calculation may not be applicable  to patients at the low and high extremes of body sizes, pregnant  women, and vegetarians.    21-Feb-15 05:39, Hemoglobin A1c ([facility])  Hemoglobin A1c ([facility]) 11.8  The American Diabetes Association recommends that a primary goal of  therapy should be <7% and that physicians should reevaluate the  treatment regimen in patients with HbA1c values consistently >8%.    23-Feb-15 0519:62Basic Metabolic Panel (w/Total Calcium)  Glucose, Serum 84  BUN 16  Creatinine (comp) 0.66  Sodium, Serum 142  Potassium, Serum 3.9  Chloride, Serum 108  CO2, Serum 32  Calcium (Total), Serum 8.6  Anion Gap 2  Osmolality (calc) 284  eGFR (African American) >60  eGFR (Non-African American) >60  eGFR values <6032min/1.73 m2 may be an indication of chronic  kidney disease (CKD).  Calculated eGFR is useful in patients with stable renal function.  The eGFR calculation will not be reliable  in acutely ill patients  when serum creatinine is changing rapidly. It is not useful in   patients on dialysis. The eGFR calculation may not be applicable  to patients at the low and high extremes of body sizes, pregnant  women, and vegetarians.  Routine Coag:    20-Feb-15 16:25, Activated PTT  Activated PTT (APTT) 26.5  A HCT value >55% may artifactually increase the APTT. In one study,  the increase was an average of 19%.  Reference: "Effect on Routine and Special Coagulation Testing Values  of Citrate Anticoagulant Adjustment in Patients with High HCT Values."  American Journal of Clinical Pathology 2006;126:400-405.    20-Feb-15 16:25, Prothrombin Time  Prothrombin  13.8  INR 1.1  INR reference interval applies to patients on anticoagulant therapy.  A single INR therapeutic range for coumarins is not optimal for all  indications; however, the suggested range for most indications is  2.0 - 3.0.  Exceptions to the INR Reference Range may include: Prosthetic heart  valves, acute myocardial infarction, prevention of myocardial  infarction, and combinations of aspirin and anticoagulant. The need  for a higher or lower target INR must be assessed individually.  Reference: The Pharmacology and Management of the Vitamin K   antagonists: the seventh ACCP Conference on Antithrombotic and  Thrombolytic Therapy. ASTMH.9622 Sept:126 (3suppl): [id number].  A HCT value >55% may artifactually increase the PT.  In one study,   the increase was an average of 25%.  Reference:  "Effect on Routine and Special Coagulation Testing Values  of Citrate Anticoagulant Adjustment in Patients with High HCT Values."  American Journal of Clinical Pathology 2006;126:400-405.    20-Feb-15 22:43, Activated PTT  Activated PTT (APTT) 43.7  A HCT value >55% may artifactually increase the APTT. In one study,  the increase was an average of 19%.  Reference: "Effect on Routine and Special Coagulation Testing  Values  of Citrate Anticoagulant Adjustment in Patients with High HCT Values."  American Journal of Clinical Pathology 2006;126:400-405.    21-Feb-15 05:39, Activated PTT  Activated PTT (APTT) 61.2  A HCT value >55% may artifactually increase the APTT. In one study,  the increase was an average of 19%.  Reference: "Effect on Routine and Special Coagulation Testing Values  of Citrate Anticoagulant Adjustment in Patients with High HCT Values."  American Journal of Clinical Pathology 2006;126:400-405.    21-Feb-15 12:27, Activated PTT  Activated PTT (APTT) 81.8  A HCT value >55% may artifactually increase the APTT. In one study,  the increase was an average of 19%.  Reference: "Effect on Routine and Special Coagulation Testing Values  of Citrate Anticoagulant Adjustment in Patients with High HCT Values."  American Journal of Clinical Pathology 2006;126:400-405.  Routine Hem:    20-Feb-15 16:25, Hemogram, Platelet Count  WBC (CBC) 6.7  RBC (CBC) 4.54  Hemoglobin (CBC) 14.9  Hematocrit (CBC) 43.8  Platelet Count (CBC) 169  Result(s) reported on 21 Aug 2013 at 04:40PM.  MCV 97  MCH 32.8  MCHC 34.0  RDW 13.8    21-Feb-15 05:39, CBC Profile  WBC (CBC) 6.6  RBC (CBC) 4.42  Hemoglobin (CBC) 14.1  Hematocrit (CBC) 42.5  Platelet Count (CBC) 177  MCV 96  MCH 31.8  MCHC 33.1  RDW 13.7  Neutrophil % 32.4  Lymphocyte % 50.4  Monocyte % 13.0  Eosinophil % 3.8  Basophil % 0.4  Neutrophil # 2.1  Lymphocyte # 3.3  Monocyte # 0.9  Eosinophil # 0.2  Basophil # 0.0  Result(s) reported on 22 Aug 2013 at 06:17AM.   RADIOLOGY:  Radiology Results: XRay:    16-Sep-14 09:33, Abdomen 3 Way Includes PA Chest  Abdomen 3 Way Includes PA Chest  REASON FOR EXAM:    Pain, dyspnea, vomiting  COMMENTS:   May transport without cardiac monitor    PROCEDURE: DXR - DXR ABDOMEN 3-WAY (INCL PA CXR)  - Mar 17 2013  9:33AM     RESULT: The lungs appear clear. The heart and pulmonary vessels appear to    be normal. Surgical clips project over the abdomen. There is no evidence   of free air, abnormal gastric or bowel distention or significant mass   effect. The decubitus view  is overexposed.    IMPRESSION:   1. No evidence of bowel obstruction or perforation. No acute   cardiopulmonary disease evident.    Dictation Site: 2    Verified By: Sundra Aland, M.D., MD    08-Oct-14 11:03, Chest PA and Lateral  Chest PA and Lateral  REASON FOR EXAM:    cough  COMMENTS:       PROCEDURE: DXR - DXR CHEST PA (OR AP) AND LATERAL  - Apr 08 2013 11:03AM     RESULT: Comparison: None    Findings:     PA and lateral chest radiographs are provided. The lungs are   hyperinflated likely secondary to COPD. There is no focal parenchymal   opacity, pleural effusion, or pneumothorax. The heart and mediastinum are   unremarkable.  The osseous structures are unremarkable.    IMPRESSION:   No acute disease of the chest.    Dictation Site: 1        Verified By: Jennette Banker, M.D., MD  [place]:    08-Oct-14 10:10, [place] Color Flow Doppler Low Extrem Left (Leg)  [place] Color Flow Doppler Low Extrem Left (Leg)  REASON FOR EXAM:    lle pain and swelling x2 weeks since surgery. +smoker  COMMENTS:       PROCEDURE: [place]  - US DOPPLER LOW EXTR LEFT  - Apr 08 2013 10:10AM     RESULT: Comparison: None    Findings: Multiple longitudinal and transverse gray-scale as well as   color and spectral Doppler images of the left lower extremity veins were   obtained from the common femoral veins through the popliteal veins.    The common femoral vein, superficial femoral vein and popliteal veins are   noncompressible withno color-flow demonstrated at all venous levels most   concerning for occlusive deep venous thrombosis.  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 Corky Downs  on 10 /8/ 2014 at 1010 hours.    Dictation Site: 1        Verified By: Jennette Banker, M.D., MD    03-Dec-14 15:43, [place] Color Flow Doppler Low Extrem Left (Leg)  [place] Color Flow Doppler Low Extrem Left (Leg)  REASON FOR EXAM:    History of DVT  COMMENTS:       PROCEDURE: [place]  - US DOPPLER LOW EXTR LEFT  - Jun 03 2013  3:43PM     CLINICAL DATA:  History of DVT.  Pain and swelling.    EXAM:  Left LOWER EXTREMITY VENOUS DOPPLER ULTRASOUND    TECHNIQUE:  Gray-scale sonography with graded compression, as well as color  Doppler and duplex ultrasound, were performed to evaluate the deep  venous system from the level of the common femoral vein through the  popliteal and proximal calf veins. Spectral Doppler was utilized to  evaluate flow at rest and with distal augmentation maneuvers.    COMPARISON:  04/08/2013    FINDINGS:  Thrombus within deep veins: Nonocclusive thrombus in the left common  femoral vein and saphenous vein. This has improved from the prior  study. Remainder of the deep venous system is negative for DVT.     IMPRESSION:    Improvement in DVT compared with 04/08/2013. Nonocclusive thrombus  remains in the left common femoral vein and saphenous vein.    Electronically Signed  By: Franchot Gallo M.D.    On: 06/03/2013 16:10         Verified By: Truett Perna, M.D.,    20-Feb-15 14:50, [place] Color Flow Doppler Low Extrem Left (Leg)  [place] Color Flow Doppler Low Extrem Left (Leg)  REASON FOR EXAM:    History of DVT  COMMENTS:       PROCEDURE: [place]  - US DOPPLER LOW EXTR LEFT  - Aug 21 2013  2:50PM     CLINICAL DATA:  History of left lower extremity DVT. Pain and edema.    EXAM:  LEFT LOWER EXTREMITY VENOUS DOPPLER ULTRASOUND    TECHNIQUE:  Gray-scale sonography with graded compression, as well as color  Doppler and duplex ultrasound, were performed to evaluate the deep  venous system from the level of the common femoral vein through the  popliteal and proximal  calf veins. Spectral Doppler was utilized to  evaluate flow at rest and with distal augmentation maneuvers.    COMPARISON:  [place] EXTREM LOW VENOUS*L* dated 06/03/2013; [place] EXTREM LOW  VENOUS*L* dated 04/08/2013    FINDINGS:  Thrombus within deep veins: Chronic echogenicmural thrombus is  identified in the common femoral vein which is nonocclusive. Minimal  mural thrombus is identified in the femoral vein without evidence of  significant luminal narrowing. The popliteal vein and visualized  calf veins are normally patent.    Superficial veins: Nonocclusive thrombus is seen in the upper GSV  near the saphenous femoral junction. There also is evidence of  thrombus in the GSV at the level of the thigh, knee and calf. This  superficial thrombophlebitis may be more acute and the recent for  new symptoms.     IMPRESSION:  1. Evidence of prior left lower extremity DVT with nonocclusive  chronic mural thrombus identified in the common femoral vein and  femoral vein. This is not causing significant luminal narrowing and  there is no definite acute DVT identified.  2. Superficial thrombophlebitis of the great saphenous vein. This  may be more acute and symptomatic.      Electronically Signed    By: Aletta Edouard M.D.    On: 08/21/2013 15:04     Verified By: Azzie Roup, M.D.,  [place]:    16-Sep-14 09:33, Abdomen 3 Way Includes PA Chest  PACS Image    16-Sep-14 12:02, CT Abdomen and Pelvis With Contrast  PACS Image    16-Sep-14 15:41, CT Guide for Abscess Drainage (Specify)  PACS Image    17-Sep-14 10:09, CT Pelvis Without Contrast  PACS Image    08-Oct-14 10:10, [place] Color Flow Doppler Low Extrem Left (Leg)  PACS Image    08-Oct-14 11:03, Chest PA and Lateral  PACS Image    03-Dec-14 15:43, [place] Color Flow Doppler Low Extrem Left (Leg)  PACS Image    20-Feb-15 14:50, [place] Color Flow Doppler Low Extrem Left (Leg)  PACS Image    20-Feb-15 18:30, CT ANGIOGRAHPY Chest with for PE   PACS Image    22-Feb-15 18:33, CT Angiography Aorta Iliofemoral Runoff  PACS Image  CT:    16-Sep-14 12:02, CT Abdomen and Pelvis With Contrast  CT Abdomen and Pelvis With Contrast  REASON FOR EXAM:    (1) RLQ pain, leukocytosis; (2) RLQ pain, leukocytosis  COMMENTS:       PROCEDURE: CT  - CT ABDOMEN / PELVIS  W  - Mar 17 2013 12:02PM     RESULT: Axial CT scanning was performed through the abdomen  and pelvis   with reconstructions at 3 mm intervals and slice thicknesses. The patient   received oral contrast material and also received 80 cc of Isovue 370   intravenously.    There is an abnormal fluid density and soft tissue density process in the   retroperitoneum on the right extending from the renal fossa inferiorly   into the upper pelvis. This appears to have multiple septations and it   has an irregular slightly enhancing border. It insinuates itself along   the lateral and anterior aspects of the psoas muscle. It isclosely     applied to the lower pole of the right kidney but the kidney appears to   enhance well. The cecum and ascending colon are closely applied to this   abnormal inflammatory appearing mass.    The liver exhibits mild intrahepatic ductal dilation likely secondary to   previous cholecystectomy. The spleen is normal in density and contour.   The stomach and small bowel exhibit no acute abnormalities. The pancreas   exhibits no evidence of masses or inflammation. There are no adrenal   masses. The kidneys exhibit no evidence of obstruction or pyelonephritis.   The urinary bladder is quite distended and contains an air-fluid level.   The transverse and descending and rectosigmoid portions of the colon   contain a moderate amount of stool but not do not appear inflamed.  The   caliber of the abdominal aorta is normal. The lung bases are clear. The   lumbar vertebral bodies are preserved in height.  IMPRESSION:   1. There is an abnormal appearance of the  retroperitoneum on the right.   This extends from the level of the kidney inferiorly. It is closely   applied to the right kidney and to the cecum and ascending colon. The   findings are worrisome for a retroperitoneal abscess. This may be arising   from the right colon or less likely from the lower pole of the right   kidney.  2. There is marked distention of the urinary bladder. There is an   air-fluid level and there may be a small amount of mural air involving   the bladder. Foley catheterization is recommended. It is possible that   the retroperitoneal fluid collection communicates with the urinary   bladder but the site of such communication is not demonstrated.  3. There is a moderate amount of stool within the transverse and left   colon which suggests constipation.  4. There is no acute hepatobiliary abnormality.  5. While the right kidney is closely applied to the retroperitoneal fluid   collection there is no evidence of pyelonephritis nor of obstruction of   the right kidney.     Dictation Site: 1        Verified By: DAVID A. [place], M.D., MD    16-Sep-14 15:41, CT Guide for Abscess Drainage (Specify)  CT Guide for Abscess Drainage (Specify)  REASON FOR EXAM:    retroperitoneal abscess  COMMENTS:       PROCEDURE: CT  - CT GUIDED ABSCESS DRAINAGE  - Mar 17 2013  3:41PM     RESULT: CT-Guided Percutaneous Abscess Drainage       Indication: Right retroperitoneal abscess Percutaneous drainage is   requested.    Comparisons: None    Procedure:     Clinical assessment was performed and informed consent obtained.  The     patient was brought to the CT suite and placed supine on the table.  A   focused abdomen/pelvis CT without contrast was performed.     There is a large right retroperitoneal fluid collection. The origin is   difficult to delineate. The collection was deemed amenable to drainage.    The right lower quadrant was prepped and draped in the usual sterile    fashion. The overlying skin was anesthetized with 1% lidocaine. A 21   gauge micropuncture needle was inserted and the location confirmed by CT   fluoroscopy. The needle was advanced and placement within the collection   confirmed by CT and syringe aspiration of purulentmaterial which was   sent for gram stain and culture.    The 21 gauge needle was exchanged for a 5 [place] dilator over a wire. The   dilator was then exchanged for an 8 [place] transitional dilator followed     by placement of a 10.2 [place] APDL pigtail catheter into the fluid   collection.     110 mL of brown purulent material was aspirated. The specimen was sent   for Gram stain and cultures.     The catheter was secured to the skin using a StatLock device, dressed,   and connected to a drainagebag.    The procedure was well tolerated and without complication. Hemostasis was   achieved. The patient was transferred to the recovery unit in stable   condition.    IMPRESSION:   Successful CT-guided placement of a 10.2 [place] APDL pigtail catheter   into a right retroperitoneal fluid collection.    Dictation Site: 1        Verified By: Jennette Banker, M.D., MD    17-Sep-14 10:09, CT Pelvis Without Contrast  CT Pelvis Without Contrast  REASON FOR EXAM:    Pneumocystis, Retroperitoneal Abscess  (CT Cystogram   with Contrast through foley  COMMENTS:       PROCEDURE: CT  - CT PELVIS STANDARD WO  - Mar 18 2013 10:09AM     RESULT: A triphasic study to allow cystography of the urinary bladder was   performed. The patient received oral contrast on yesterday's abdominal   and pelvic CT and today received intravesical contrast.    On the noncontrast images the drainage catheter can be seen in the   abscess cavity on the right in the lower abdomen and upper pelvis. The   urinary bladder is contains gas is largely decompressed by a Foley   catheter. Following instillation of contrast material no leakage of the    intravesical contrast into these into the surrounding soft tissues is     demonstrated. There is gas present in the lumen of the urinary bladder.   On delayed images the urinary bladder is nearly totally decompressed with   only a tiny amount of contrast and gas present. The Foley catheter   remains visible.    Free fluid is demonstrated within the pelvis with no evidence of leakage   of contrast into this free fluid. The uterus it is somewhat vertically   oriented.    IMPRESSION:    1. There is no evidence of extravasation of contrast from the urinary   bladder. Thereis gas within the urinary bladder lumen which was   demonstrated on yesterday's study. A Foley catheter is in place. A Foley   catheter is present.  2. The abscess cavity has been drained in the right lower abdomen and     upper pelvis.     DictationSite: 2  Verified By: DAVID A. [place], M.D., MD    [phone] 18:33, CT Angiography Aorta Iliofemoral Runoff  CT Angiography Aorta Iliofemoral Runoff  REASON FOR EXAM:    pain in LLE, r/o arterial obstruction/insufficiency  COMMENTS:  Change per Dr. Ether Griffins     PROCEDURE: CT  - CT ANGIOGRAPHY AORTA RUNOFF  - Aug 23 2013  6:33PM     CLINICAL DATA:  Left lower leg pain, evaluate for arterial  obstruction/insufficiency    EXAM:  CT ANGIOGRAPHY OF ABDOMINAL AORTA WITH ILIOFEMORAL RUNOFF    TECHNIQUE:  Multidetector CT imaging of the abdomen, pelvis and lower  extremities was performed using the standard protocol during bolus  administration of intravenous contrast. Multiplanar CT image  reconstructions and MIPs were obtained to evaluate the vascular  anatomy.    CONTRAST:  One her cc Isovue 370    COMPARISON:  CT ABD-PELV W/ CM dated 03/17/2013; CT ANGIO CHEST dated  08/21/2013    FINDINGS:  Vascular Findings:    Abdominal aorta: There is age advanced severe mixed calcified and  noncalcified irregular atherosclerotic plaque involving  predominantly  in the infrarenal abdominal aorta resulting an at  least 60% luminal narrowing just caudal to the takeoff of the IMA,  approximately 1.7 cm caudal to the aortic bifurcation. While of the  aortic lumen is irregular, there is no evidence of abdominal aortic  dissection. No evidence of abdominal aortic stranding.    Celiac artery: Widely patent; conventional branching pattern.    SMA: There is a minimal amount of eccentric mixed calcified and  noncalcified atherosclerotic plaque involving the mid aspect of the  main trunk of the SMA (image 31, series 9), not resulting in  hemodynamically significant stenosis. Conventional branching  pattern. The distal tributaries of the SMA appear widely patent.  There are no discrete filling defects within the opacified portions  of the distal tributaries of the SMA to suggest distal embolism.    Right Renal artery: Solitary with early bifurcation ; there is a  mixture of calcified and noncalcified atherosclerotic plaque  involving the origin of the right renal artery (representative axial  images 25 and 26, series 9, coronal image 44, series 13) which  likely results in at least 50% luminal narrowing. This finding is  without associated delayed perfusion of the right kidney or  asymmetric right-sided renal atrophy.    Left Renal artery: Solitary; there is a minimal amount of mixed  calcified and noncalcified atherosclerotic plaque involving the  origin of the left renal artery, not resulting in hemodynamically  significant stenosis.    IMA: Widely patent.    ---------------------------------------------------------------------------  -----    RIGHT LOWER EXTREMITY VASCULATURE:    Right-sided pelvic vasculature: There is mixed noncalcified and  predominantly calcified atherosclerotic plaque involving the right  common iliac artery, which likely approaches 50% luminal narrowing  (image 60, series 9). The right external iliac artery is  widely  patent and free if clinically significant narrowing. The right  internal iliac artery is heavily disease still patent and of normal  caliber.    Right common femoral artery: There is a eccentric mixed calcified  and noncalcified atherosclerotic plaque within the right common  femoral artery, (images 106 and 107, series 9), not definitely  resulting in hemodynamically significant narrowing.    Right deep femoral artery: Widely patent without hemodynamically  significant narrowing.    Right superficial femoral artery: There is extensive mixed calcified  and noncalcified atherosclerotic plaque throughout the right  superficial  femoral artery resulting in multi focal tandem areas of  hemodynamically significant narrowing (at least 60%) -  representative proximal narrowing - image 124, series 9; mid - image  152; distally - image 182.    Right popliteal artery: There is eccentric predominantly  noncalcified atherosclerotic plaque involvingthe proximal aspect of  the right above knee popliteal artery (image 192, series 9) which  results in approximately 50% luminal narrowing. There is eccentric  mixed calcified and noncalcified atherosclerotic plaque involving  the intercondylar aspectof the right above knee popliteal artery  which results in severe (greater than 70%) luminal narrowing (image  225, series 9). The right below-knee popliteal artery is diminutive  throughout its course though patent.    Right lower leg: Three-vessel runoff to the right foot. The peroneal  artery appropriately tapers at the level of the lower leg. The  dorsalis pedis artery is patent to the level of the forefoot.      ---------------------------------------------------------------------------  ------  LEFT LOWER EXTREMITY VASCULATURE:    Left-sided pelvic vasculature: There is mixture of noncalcified and  predominantly calcified atherosclerotic plaque involving the left  common iliac artery,  likely resulting in 50% luminal narrowing  within its distal aspect (image 60, series 9). The left external  iliac artery is widely patent and free of hemodynamically  significant narrowing. The left internal iliac artery is heavily  diseased though patent and of normal caliber.    Left common femoral artery: There is eccentric mixed calcified and  noncalcified atherosclerotic plaque within the right common femoral  artery (image 110, series 9) not definitely result in  hemodynamically significant narrowing.  Left deep femoral artery:  Widely patent.    Left superficial femoral artery: There are multi focal tandem areas  of hemodynamically significant narrowing (at least 60%) throughout  the left superficial femoral artery - representative proximal  hemodynamically significant narrowing - images 117 and 119, series  9, mid- image 167; distal -image 184.    Left popliteal artery: There is a mixture of eccentric calcified and  noncalcified atherosclerotic plaque within the proximal aspect of  the left above knee popliteal artery which resultsin severe  (greater 70%) narrowing - image 211, series 9. There additional  tandem areas of clinically significant narrowing throughout the left  above knee popliteal artery which is diminutive throughout its  course. The left below-knee popliteal artery is diminutive though  patent.    Left lower leg: Three-vessel runoff to the left lower foot. The  peroneal artery expectedly tapers at the level of the lower leg. The  left dorsalis pedis artery occludes at the level of the hind foot  (image 135,series 10) though reconstitutes proximally (image 139  and is patent to the level of the forefoot.    Review of the MIP images confirms the above findings.      ---------------------------------------------------------------------------  -----    Nonvascular Findings:  Evaluation of the abdominal organs is limited due to the arterial  phase of  enhancement.    Limited visualization of the lower thorax is negative for focal  airspace opacity or pleural effusion.    No definite pericardial effusion.    Normal hepatic contour. There is an ill-defined subcentimeter  (approximately 0.9 x 0.8 cm area of hyper enhancement adjacent to  the subcapsular medial aspect of the posterior segment of the right  lobe of the liver (image 23, series 9). It is unclear whether this  structure arises from the adjacent right adrenal gland or represent  a discrete hepatic lesion. Post cholecystectomy. There is mild  dilatation of the common bile duct and central aspect of  predominantly the left intrahepatic biliary tree, presumably  secondary to post cholecystectomy state. No ascites.    There is symmetric enhancement of the bilateral kidneys. No definite  renal stones on this postcontrast examination. No discrete renal  lesions. No urinary obstruction or perinephric stranding. There is  grossly unchanged thickening of the crux of the the left adrenal  gland without discrete nodule. The right adrenal gland is  suboptimally evaluated secondary to arterial phase of enhancement  and imaging technique. Normal appearance pancreas and spleen.    Moderate to large colonic stool burden without evidence of enteric  obstruction. Normal appearance of the appendix. No pneumoperitoneum,  pneumatosis or portal venous gas.  No bulky retroperitoneal, mesenteric, pelvic or inguinal  lymphadenopathy though evaluation is degraded due to imaging  technique and lack of significant mesenteric fat.    Normal appearance of the pelvic organs. No free fluid within the  pelvis. The urinary bladder appears slightly patulousbut otherwise  normal.    Subcutaneous emphysema is noted about the left lower anterior  abdominal wall presumably at the location of subdural dermal  medication administration.    There is an approximately 1.6 x 1.5 cm ground-glass lesion  within  the proximal aspects of the right femoral diaphysis which is without  associated cortical disruption, periostitis or soft tissue mass.   IMPRESSION:  1. Age advanced severe mixed calcified and noncalcified irregular  atherosclerotic plaque within primarily the infrarenal abdominal  aorta resulting in at least 60% luminal narrowing distally. No  evidence of abdominal aortic dissection or periaortic stranding.  2. Predominately calcified atherosclerotic plaque within the  bilateral common iliac arteries results in approximately 50% luminal  narrowing bilaterally.  3. Multi focal tandem areas of hemodynamically significant narrowing  throughout the right superficial femoral and above knee popliteal  arteries. Three-vessel runoff to the right foot. The right-sided  dorsalis pedis artery is patent to the level of the forefoot appear  4. Multi focal tandem areas of hemodynamically significant narrowing  throughout the left superficial femoral artery and above knee  popliteal arteries. Three-vesselrunoff to the left foot.  Short-segment occlusion of the proximal aspect the left dorsalis  pedis artery with early reconstitution.  5. Suspected hemodynamically significant narrowing (approximately  50%) of the origin of the right renal artery without associated  delayed right-sided renal enhancement or asymmetric right-sided  renal atrophy.  6. 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  7. Indeterminate sub centimeter (approximately 0.9 cm) area of  hyperenhancement adjacent to the subcapsular medial aspect of the  posterior segment of the right lobe of the liver - it is unclear if  this structure is located within the hepatic parenchyma or  associated with the adjacent right adrenal gland the regardless is  likely too small to accurately characterize given small size.  Further evaluation could be performed with follow-up abdominal  MRI  in 6 months as clinically indicated.      Electronically Signed    By: Sandi Mariscal M.D.    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  There is aortoiliac disease present, although this does not appear particularly high  grade.  There is some infrainguinal stenosis in the left SFA and popliteal region as well.  Does not appear to be a CTO.  This may be contributing to chronic rest pain, and her pain may be worse with SVT in place as well as post-phlebitic issues.  Needs to stop smoking, and no intervention will be durable if she keeps smoking.   Angiogram with possible revascularization seems reasonable and will plan to do later this week.  She reports a malignancy work up tomorrow.  Discussed procedure with patient and her daughter, and they are desireable to proceed.     level 5   Electronic Signatures: Algernon Huxley (MD)  (Signed 23-Feb-15 15:30)  Authored: Chief Complaint and History, PAST MEDICAL/SURGICAL HISTORY, ALLERGIES, HOME MEDICATIONS, Family and Social History, Review of Systems, Physical Exam, LABS, RADIOLOGY, Assessment and Plan   Last Updated: 23-Feb-15 15:30 by Algernon Huxley (MD)

## 2014-10-23 NOTE — H&P (Signed)
PATIENT NAME:  April [name], April [name] MR#:  [id number] DATE OF BIRTH:  1956-02-08  DATE OF ADMISSION:  08/21/2013  EMERGENCY DEPARTMENT REFERRING PHYSICIAN: Dr. Dorothea Glassman.   PRIMARY CARE PROVIDER:  (Dictation Anomaly)    CHIEF COMPLAINT: Left leg pain.   HISTORY OF PRESENT ILLNESS: The patient is a 59 year old Caucasian female with previous history of DVT, who was hospitalized here in October. Prior to that she had a rectal peritoneal abscess and was in the hospital and then was discharged home, returned back with swelling in the legs and was noted to have left lower extremity DVT, so she was discharged home on Xarelto. After the patient remained on Xarelto.  She continues to take Xarelto.  However, over the past 2 weeks she started having pain in the left leg. She describes the pain as sharp and as going all the way from her legs to her thighs. She could not sleep. The pain continued to persist; therefore, she came to the ED. In the ER, she was evaluated with a repeat Doppler ultrasound which showed evidence of prior left lower extremity DVT with nonocclusive chronic mural thrombus and then there was a superficial thrombophlebitis of the graft and great saphenous vein. Which because it was superficial, we asked the ED physician to speak to Dr. Gilda Crease who reviewed the ultrasound and he felt that this is extending up higher and could be potentially devastating to the patient's cause of pulmonary embolism. Therefore, he recommended the patient be admitted and be started on IV heparin. He will see the patient. She also complains of "knots" in her chest and abdomen which have popped up recently. She does have intermittent shortness of breath related to cough, but denies any chest pain. No fevers. No chills. No abdominal pain. No nausea, vomiting or diarrhea. Denies any urinary frequency, urgency or hesitancy.   PAST MEDICAL HISTORY: Significant for history of DVT of the left lower extremity, diabetes poorly  controlled, history of tachycardia, history of COPD, history of retroperitoneal abscess associated UTI, has been drained in the past. History of colonic polyp on colonoscopy in November 2008 status post resection.  ALLERGIES: None.   CURRENT MEDICATIONS:  Medication list is not available. The patient is not 100% sure of what she is on but she does know she takes Xarelto.   SOCIAL HISTORY: Continues to smoke 2 packs per day. No alcohol or drug use.   FAMILY HISTORY: Mother had brain cancer probably primary brain tumor.  REVIEW OF SYSTEMS:  CONSTITUTIONAL: Denies any fevers, fatigueness, weakness. Complains of pain in the leg. No weight loss, weight gain.  EYES: No blurred or double vision. No pain. No redness. No inflammation. No glaucoma. No cataract.  ENT: No tinnitus. No ear pain. No hearing loss. No seasonal or year-round allergies. No epistaxis. No nasal discharge. No snoring. No postnasal drip. No difficulty swallowing.  RESPIRATORY: Does have intermittent cough, has a diagnosis of COPD. No TB. No pneumonia.  CARDIOVASCULAR: Denies any chest pain, orthopnea, edema or arrhythmia.  GASTROINTESTINAL: No nausea, vomiting, diarrhea. No abdominal pain. No hematemesis. No melena. No ulcer.  GENITOURINARY: Denies any dysuria, hematuria, renal calculus or frequency.  ENDOCRINE: Denies any polyuria, nocturia or thyroid problems. HEMATOLOGIC AND LYMPHATIC:  Denies any major bruisability or bleeding.  SKIN: No acne. No rash. No changes in hair, mole or skin.  MUSCULOSKELETAL: Denies any pain in the neck, back or shoulder.  NEUROLOGIC: No numbness, CVA, TIA.  PSYCHIATRIC: Has significant anxiety, but no insomnia or ADD.  PHYSICAL EXAMINATION: VITAL SIGNS: Temperature 97.5, pulse 97, respirations 22, blood pressure 127/65, O2 98% on room air.  GENERAL: The patient is a thin female in no acute distress.  HEENT: Head atraumatic, normocephalic. Pupils equally round, reactive to light and  accommodation. There is no conjunctival pallor. No scleral icterus. Nasal exam shows no drainage or ulceration. Oropharynx is clear without any exudate.  NECK: Supple without any JVD.  CARDIOVASCULAR: Regular rate and rhythm. No murmurs, rubs, clicks or gallops. PMI is not displaced.  LUNGS: Clear to auscultation bilaterally without any rales, rhonchi, wheezing.  ABDOMEN: Soft, nontender, nondistended. Positive bowel sounds x 4.  EXTREMITIES: I do not see any obvious swelling or erythema. Pulses: Good DP, PT pulses.  NEUROLOGIC: Awake, alert, oriented x 3. No focal deficits.  PSYCHIATRIC: Not anxious or depressed.  LYMPHATICS: No lymph nodes palpable.  DIAGNOSTIC DATA:  Ultrasound of the lower extremities showed evidence of prior left lower extremity DVT with nonocclusive chronic mural thrombus in the common femoral vein and femoral vein. This is not causing any significant luminal narrowing. Superficial thrombophlebitis of the great saphenous vein.    Labs:  Glucose 394, BUN 16, creatinine 0.81, sodium 138, potassium 4.4, chloride 104. CO2 is 31. Calcium is 8.6. WBC 6.7, hemoglobin169. INR is 1.1.  The patient is a 59 year old white female without history of deep vein thrombosis in the lower extremity on the left was in the hospital 10/14 with deep venous thrombosis.  After being discharged from the hospital previously was seen by hematology and vasculature now presents back with worsening pain in the left leg, has new superficial left vein thrombus ED. PA spoke to Dr. Gilda CreaseSchnier . He recommends admission due to clot extending upwards.  1.  Deep vein thrombosis with superficial thrombophlebitis with concern of the clot in the superficial vein extending into the deeper vein. At this time, we will treat her with IV heparin, stop Xarelto.  I am going to ask hematology to see the patient, as well as Dr. Gilda CreaseSchnier. Coumadin likely can be started tomorrow due to patient having been on Xarelto already. CT per  pulmonary embolism protocol ordered per pulmonary embolism per the ED PA. We will follow up on the results.  2.  Diabetes. Was on metformin and glipizide. We will obtain home medication list prior to resumption. I am going to place her on sliding scale insulin, check a hemoglobin A1c. 3.  Chronic obstructive pulmonary disease. We will do p.r.n. O24.  Nicotine addiction.  The patient counseled regarding smoking cessation, 3 minutes spent. Told the patient to stop smoking. Nicotine patch will be started.    NOTE:  50 minutes spent.    ____________________________ Lacie ScottsShreyang H. Allena KatzPatel, MD shp:ce D: 08/21/2013 18:14:59 ET T: 08/21/2013 18:26:10 ET JOB#: [id number]  cc: Cameryn Chrisley H. Allena KatzPatel, MD, <Dictator> Charise CarwinSHREYANG H Simrat Kendrick MD ELECTRONICALLY SIGNED 09/04/2013 17:58

## 2014-10-25 ENCOUNTER — Ambulatory Visit
Admit: 2014-10-25 | Disposition: A | Discharge status: Home / Self Care | Payer: Self-pay | Attending: Internal Medicine | Admitting: Internal Medicine

## 2014-10-25 LAB — PROTIME-INR
INR: 1
Prothrombin Time: 12.9 secs

## 2014-10-26 LAB — CEA: CEA: 7.1 ng/mL — ABNORMAL HIGH (ref 0.0–4.7)

## 2014-10-28 ENCOUNTER — Other Ambulatory Visit: Payer: Self-pay | Admitting: Internal Medicine

## 2014-10-28 DIAGNOSIS — R911 Solitary pulmonary nodule: Secondary | ICD-10-CM

## 2014-10-29 ENCOUNTER — Encounter: Payer: Self-pay | Admitting: *Deleted

## 2014-10-31 NOTE — Discharge Summary (Signed)
PATIENT NAME:  April [name], April [name] MR#:  [id number] DATE OF BIRTH:  Jan 24, 1956  DATE OF ADMISSION:  10/13/2014 DATE OF DISCHARGE:  10/15/2014  PRESENTING COMPLAINT: Lower abdominal pain, frequent urination.   DISCHARGE DIAGNOSES: 1. Acute cystitis.  2. Hyperosmolar nonketotic hyperglycemia.  3. Uncontrolled type 2 diabetes.  4. 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  5. Chronic smoker.  CODE STATUS: FULL CODE.   MEDICATIONS: 1. Acetaminophen/oxycodone 325/10 one tablet 4 times a day as needed.  2. Advair 250/50 one puff b.i.d.  3. Albuterol oral inhaler 2 puffs every 4 hours as needed.  4. Levemir 10 units subcutaneous at bedtime.  5. NovoLog FlexPen 10 units t.i.d.  6. Warfarin 6 mg p.o. daily.   7. Keflex 500 mg b.i.d.  8. Glucerna shake one can b.i.d.   FOLLOWUP:  [facility]. [facility], to get/INR checked.  2. Follow up with Benita Gutterobert Gittin, MD, for your chronic lower extremity DVT.   LABORATORY AND IMAGING:  INR at discharge was 1.7. Ultrasound Doppler lower extremities shows no acute deep vein thrombosis. Chronic nonocclusive echogenic mural thrombus appears less prominent in the common femoral vein and stable in femoral vein. The great saphenous vein now is recanalized with some nonocclusive chronic thrombus present at the level of the saphenofemoral junction extending just into the upper segment of the GSV in the proximal thigh. Urine culture negative in 8-12 hours.   BRIEF SUMMARY OF HOSPITAL COURSE: April [name] is a 59 year old Caucasian female who looks older than her stated age, came in with increased frequency of urination. She was admitted with:  1. Nonketotic hyperosmolar hyperglycemia. She was placed on IV fluids, IV insulin drip.   She was resumed back on Levemir and NovoLog.  Dietary and  nonmedical noncompliance were discussed.  2. Acute cystitis. The patient was given a course of Keflex.  3. History of left lower  extremity DVT. The patient has history of noncompliance with Coumadin along with follow-up with Dr. Lorre NickGittin. Ultrasound lower extremity Doppler was done to ensure clot has resolved; however, the patient does have chronic mural thrombus; hence, I continued her p.o. warfarin.   INR at discharge was 1.7. [facility]. Importance for continue taking Coumadin was discussed with the patient.  4. COPD, stable. Continue the inhalers.   Hospital stay otherwise remained stable. The patient remained a full code. Home health PT has been arranged.   TIME SPENT: 40 minutes.     ____________________________ Wylie HailSona A. Allena KatzPatel, MD sap:tr D: 10/16/2014 06:53:00 ET T: 10/16/2014 12:01:13 ET JOB#: [id number]  cc: Germain Koopmann A. Allena KatzPatel, MD, <Dictator> Willow OraSONA A Garin Mata MD ELECTRONICALLY SIGNED 10/20/2014 10:34

## 2014-10-31 NOTE — H&P (Signed)
PATIENT NAME:  April [name], April [name] MR#:  [id number] DATE OF BIRTH:  06/26/1956  DATE OF ADMISSION:  10/13/2014  PRIMARY CARE PHYSICIAN:  Pricilla Riffle, MD   REFERRING EMERGENCY ROOM PHYSICIAN: Acquanetta Belling, MD   CHIEF COMPLAINT: Lower abdominal pain, frequent urination.   HISTORY OF PRESENT ILLNESS: The patient is a 59 year old female with history of insulin-requiring diabetes mellitus, recent history of bilateral lower extremity DVT, noncompliance is presenting to the ED with a chief complaint, "I think I have a UTI, I am peeing all night." The patient was complaining of lower abdominal pain and frequent urination, no fevers; complaining of nausea intermittently. No vomiting; states compliant with her insulin, but BMP has revealed a glucose at 830, with normal bicarbonate and anion gap; denies any chest pain or shortness of breath.  The patient sees Dr. Lorre Nick as outpatient for bilateral lower extremity deep vein thrombosis on Coumadin, but INR is at 0.9. Hospitalist team is called to admit the patient. Patient had 2 liters of IV fluid boluses.   PAST MEDICAL HISTORY:  1.  Insulin-requiring diabetes mellitus.  2.  COPD.  3.  Bilateral lower extremity DVT.  4.  Hyperlipidemia.  5.  The patient has chronic hypoxic respiratory failure, lives on 2 to 2.5 liters of oxygen as baseline.  6.  Anxiety.  7.  Tobacco abuse.  8.  Colon polyp on colonoscopy.   PAST SURGICAL HISTORY: Colonoscopy.   ALLERGIES: No known drug allergies.   PSYCHOSOCIAL HISTORY: Lives at home with daughter, patient used to smoke 2 packs per day, now she is smoking half pack a day, denies alcohol or illicit drug usage.   FAMILY HISTORY: Mother died from brain cancer at age 49; father had heart disease.   REVIEW OF SYSTEMS:   CONSTITUTIONAL: Denies any fever, fatigue; complaining of weakness, chronic bilateral lower extremity pain.  EYES: Denies blurry vision, double vision.  EARS, NOSE, AND THROAT: Denies epistaxis,  discharge.  RESPIRATORY: Denies cough, has COPD, lives on 2.5 liters of oxygen via nasal cannula.  CARDIOVASCULAR: No chest pain, denies any palpitations.  GASTROINTESTINAL: Complaining of nausea; no vomiting, diarrhea.  GENITOURINARY: Complaining of polyuria, no hematuria, some degree of dysuria and complaining of polyuria, no nocturia, has insulin-requiring diabetes mellitus.  HEMATOLOGIC AND LYMPHATIC: Has bilateral lower extremity DVT on Coumadin; follows with Dr. Lorre Nick.  MUSCULOSKELETAL: No arthritis but has chronic lower extremity pain.  NEUROLOGIC: Denies any TIA, no seizures, no memory loss.  PSYCHIATRIC: No anxiety, depression.   HOME MEDICATIONS: The patient takes Percocet at 325/10 mg 1 tablet p.o. 4 times a day as needed for pain; Advair 250/50 one puff inhalation 2 times a day; albuterol 2 puffs inhalations every 4 hours as needed for shortness of breath; Coumadin 3 mg 1 tablet p.o. once daily and  Levemir 10 units subcutaneously once daily at bedtime, NovoLog 10 units 3 times a day with meals.   PHYSICAL EXAMINATION: VITAL SIGNS: Temperature 97.6, pulse 105, respirations 18, blood pressure 130/64, pulse oximetry 99% on room air.  GENERAL APPEARANCE: Thin looking female in no apparent distress.  HEENT: Normocephalic, atraumatic, pupils are equal and reactive to light and accommodation, no scleral icterus, no conjunctival injection; no sinus tenderness, no postnasal drip, dry mucous membranes.  NECK: Supple, no JVD, no thyromegaly.   LUNGS: Clear to auscultation bilaterally. No accessory muscle use and no anterior chest wall tenderness on palpation.  CARDIAC: S1, S2 normal, regular rate and rhythm, no murmurs.  GASTROINTESTINAL: Soft, bowel sounds  are positive in all 4 quadrants; lower abdominal tenderness is present. No masses felt. No rebound tenderness.  NEUROLOGIC: Awake, alert, oriented x 3; cranial nerves II through XII are grossly intact, motor and sensory are intact, reflexes  are 2+.  EXTREMITIES: No cyanosis. No clubbing.  SKIN: Warm to touch, dry in nature; no rashes, no lesions.  MUSCULOSKELETAL: No joint effusion, tenderness, or erythema; chronic bilateral calf  tenderness is present and takes Percocet regarding that.  PSYCHIATRIC: Normal mood and affect.   LABORATORY AND IMAGING STUDIES:  PT is 12.6, INR 0.9. Urinalysis yellow in color, clear in appearance, glucose greater than 500, pH 6.0, protein 30 mg/dL, nitrites negative, leukocyte 1+; WBC too numerous count, troponin less than 0.03. BMP: Glucose 830, repeat BMP is pending. BUN and creatinine are normal, sodium at 130, potassium 4.6, chloride 91, CO2 of 30, anion gap 9, GFR greater than 60; calcium 8.9, WBC 8.6, hemoglobin 15.1, hematocrit 37.6, platelets are 176,000, MCV 99.   EKG: Low voltage, normal sinus rhythm with occasional PVCs at 84 beats per minute, no acute ST-T wave changes.   ASSESSMENT AND PLAN: A 59 year old Caucasian female presenting to the emergency department with a chief complaint, "I might have a UTI, urinating a lot."  Her BMP has revealed glucose at 800; patient has received 2 liters of fluid boluses in the emergency department, hospitalist is called to admit the patient.  1.  Hyperosmolar, nonketotic hyperglycemia. We will admit her to intensive care unit. The patient is started on insulin drip for non-diabetic ketoacidosis hyperglycemia. We will monitor BMP every 6 hours. We will provide aggressive hydration with intravenous fluids, normal saline at 250 mL/h. When the blood sugar is at 200, we will change the fluids to D5 half normal with 20 potassium; patient is reporting that she is compliant with her medications including insulin; we will check hemoglobin A1c and also put a consult to diabetic clinic lifestyle.  2.  Acute cystitis, lower abdominal pain and nausea. Urine culture is ordered. We will put her on empiric antibiotic with IV Rocephin. Follow up on the urine culture and change  the antibiotic per culture report.  3.  Bilateral lower extremity deep vein thrombosis. Patient is seeing Dr. Lorre NickGittin as an outpatient. Her INR is nontherapeutic. The patient seemed to be noncompliant. We will restart Coumadin at 7.5 mg p.o. once daily and provide Lovenox subcutaneous for deep vein thrombosis prophylaxis at this point. Also put a consult to Dr. Lorre NickGittin, patient's oncologist as there is a major concern for noncompliance. By reviewing his notes we came to know that patient is forgettable and noncompliant with her medication including Coumadin.  4.  Chronic obstructive pulmonary disease. Currently no exacerbation; we will provide her Advair and albuterol treatments as needed basis.  5.  Tobacco abuse. The patient is a cutting down but still smokes 1/2 pack a day, counseled to quit smoking for 3 to 5 minutes. She verbalized understanding. We will provide nicotine patch.  6.  Chronic history of peripheral vascular disease. Dr. Lorre NickGittin is referring the patient to vascular surgery as an outpatient.  7.  She is full code, daughter is the medical power of attorney.  8.  We will provide gastrointestinal prophylaxis; deep vein thrombosis prophylaxis with Lovenox subcutaneous we will continue Coumadin and pharmacy to manage Coumadin.   Plan of care discussed with the patient.   Total critical care time spent is 50 minutes.    ____________________________ Ramonita LabAruna Jessyka Austria, MD ag:nt D: 10/13/2014 18:51:07 ET  T: 10/13/2014 19:49:39 ET JOB#: [id number]  cc: Ramonita Lab, MD, <Dictator> Pricilla Riffle, MD Knute Neu Lorre Nick, MD  Ramonita Lab MD ELECTRONICALLY SIGNED 10/15/2014 12:59

## 2014-11-01 ENCOUNTER — Ambulatory Visit: Payer: Self-pay | Admitting: Internal Medicine

## 2014-11-08 ENCOUNTER — Ambulatory Visit: Payer: Self-pay | Admitting: Internal Medicine

## 2014-11-09 ENCOUNTER — Emergency Department
Admission: EM | Admit: 2014-11-09 | Discharge: 2014-11-10 | Disposition: A | Discharge status: Home / Self Care | Payer: Medicare Other | Attending: Student | Admitting: Student

## 2014-11-09 ENCOUNTER — Other Ambulatory Visit: Payer: Self-pay

## 2014-11-09 ENCOUNTER — Emergency Department: Payer: Medicare Other

## 2014-11-09 DIAGNOSIS — E11649 Type 2 diabetes mellitus with hypoglycemia without coma: Secondary | ICD-10-CM | POA: Insufficient documentation

## 2014-11-09 DIAGNOSIS — Z7901 Long term (current) use of anticoagulants: Secondary | ICD-10-CM | POA: Insufficient documentation

## 2014-11-09 DIAGNOSIS — B9689 Other specified bacterial agents as the cause of diseases classified elsewhere: Secondary | ICD-10-CM | POA: Diagnosis not present

## 2014-11-09 DIAGNOSIS — Z720 Tobacco use: Secondary | ICD-10-CM | POA: Insufficient documentation

## 2014-11-09 DIAGNOSIS — Z7951 Long term (current) use of inhaled steroids: Secondary | ICD-10-CM | POA: Diagnosis not present

## 2014-11-09 DIAGNOSIS — Z794 Long term (current) use of insulin: Secondary | ICD-10-CM | POA: Diagnosis not present

## 2014-11-09 DIAGNOSIS — N390 Urinary tract infection, site not specified: Secondary | ICD-10-CM | POA: Insufficient documentation

## 2014-11-09 DIAGNOSIS — E162 Hypoglycemia, unspecified: Secondary | ICD-10-CM

## 2014-11-09 DIAGNOSIS — A499 Bacterial infection, unspecified: Secondary | ICD-10-CM

## 2014-11-09 LAB — COMPREHENSIVE METABOLIC PANEL
ALK PHOS: 78 U/L (ref 38–126)
ALT: 9 U/L — AB (ref 14–54)
AST: 16 U/L (ref 15–41)
Albumin: 3.3 g/dL — ABNORMAL LOW (ref 3.5–5.0)
Anion gap: 13 (ref 5–15)
BUN: 22 mg/dL — ABNORMAL HIGH (ref 6–20)
CALCIUM: 8.2 mg/dL — AB (ref 8.9–10.3)
CO2: 21 mmol/L — AB (ref 22–32)
Chloride: 106 mmol/L (ref 101–111)
Creatinine, Ser: 0.8 mg/dL (ref 0.44–1.00)
GFR calc Af Amer: >60 mL/min (ref >60)
Glucose, Bld: 203 mg/dL — ABNORMAL HIGH (ref 65–99)
Potassium: 3 mmol/L — ABNORMAL LOW (ref 3.5–5.1)
Sodium: 140 mmol/L (ref 135–145)
Total Bilirubin: 0.1 mg/dL — ABNORMAL LOW (ref 0.3–1.2)
Total Protein: 6.2 g/dL — ABNORMAL LOW (ref 6.5–8.1)

## 2014-11-09 LAB — CBC
HEMATOCRIT: 43.8 % (ref 35.0–47.0)
Hemoglobin: 14.3 g/dL (ref 12.0–16.0)
MCH: 31.5 pg (ref 26.0–34.0)
MCHC: 32.6 g/dL (ref 32.0–36.0)
MCV: 96.6 fL (ref 80.0–100.0)
Platelets: 167 10*3/uL (ref 150–440)
RBC: 4.53 MIL/uL (ref 3.80–5.20)
RDW: 14.1 % (ref 11.5–14.5)
WBC: 16.5 10*3/uL — AB (ref 3.6–11.0)

## 2014-11-09 LAB — GLUCOSE, CAPILLARY
GLUCOSE-CAPILLARY: 74 mg/dL (ref 70–99)
Glucose-Capillary: 23 mg/dL — CL (ref 70–99)
Glucose-Capillary: 61 mg/dL — ABNORMAL LOW (ref 70–99)

## 2014-11-09 MED ORDER — DEXTROSE 50 % IV SOLN
1.0000 | Freq: Once | INTRAVENOUS | Status: AC
  Administered 2014-11-09: 50 mL via INTRAVENOUS

## 2014-11-09 NOTE — ED Provider Notes (Signed)
[facility] Emergency Department Provider Note  ____________________________________________  Time seen: Approximately 9:26 PM  I have reviewed the triage vital signs and the nursing notes.   HISTORY  Chief Complaint Hypoglycemia    HPI April KillingsDoris J Sanfilippo is a 59 y.o. female with past medical history significant for COPD, diabetes, hyperlipidemia, bilateral DVT on Coumadin, presents for evaluation of altered mental status. Family at bedside reports that this afternoon she was appearing sluggish, fatigued, tired. Gradual onset. Constant and worsening. In the waiting room, she appeared to be altered, diaphoretic with glucose of 23 for which she received dextrose with dramatic improvement in her symptoms. She reports she has been compliant with her insulin but she only ate "some chicken livers today". She did not have much of an appetite. She does not take any oral medications for diabetes. She has chronic bilateral lower extremity pain secondary to DVTs but denies any recent illness including no cough, sneezing, runny nose, congestion, chest pain, difficulty breathing.   Past Medical History  Diagnosis Date  . Diabetes mellitus without complication   . Hyperlipidemia   . Anxiety   . COPD (chronic obstructive pulmonary disease)   . Tobacco abuse   . History of colonic polyps   . DVT (deep venous thrombosis)   . Pulmonary nodules/lesions, multiple 08/2012    NEGATIVE PET SCAN  . Retroperitoneal abscess     history of in 04/2013  . Colon polyps     There are no active problems to display for this patient.   Past Surgical History  Procedure Laterality Date  . Colonoscopy  05/2007    Current Outpatient Rx  Name  Route  Sig  Dispense  Refill  . albuterol (PROVENTIL HFA;VENTOLIN HFA) 108 (90 BASE) MCG/ACT inhaler   Inhalation   Inhale 2 puffs into the lungs every 4 (four) hours as needed for wheezing or shortness of breath.         .  Fluticasone-Salmeterol (ADVAIR) 250-50 MCG/DOSE AEPB   Inhalation   Inhale 1 puff into the lungs 2 (two) times daily.         . insulin aspart (NOVOLOG) 100 UNIT/ML FlexPen   Subcutaneous   Inject 20-60 Units into the skin 3 (three) times daily. Pt uses 20 units in the morning, 20 units at lunch, and 60 units at bedtime.         . Insulin Detemir (LEVEMIR) 100 UNIT/ML Pen   Subcutaneous   Inject 10 Units into the skin at bedtime.         Marland Kitchen. oxyCODONE-acetaminophen (PERCOCET) 10-325 MG per tablet   Oral   Take 1 tablet by mouth every 4 (four) hours as needed for pain.         Marland Kitchen. warfarin (COUMADIN) 1 MG tablet   Oral   Take 6 mg by mouth daily.            Allergies Eggs or egg-derived products  Family History  Problem Relation Age of Onset  . Brain cancer Mother     Social History History  Substance Use Topics  . Smoking status: Current Every Day Smoker -- 0.50 packs/day for 45 years    Types: Cigarettes  . Smokeless tobacco: Not on file     Comment: Smoking History 2.5-started at age 59  . Alcohol Use: Not on file    Review of Systems Constitutional: No fever/chills Eyes: No visual changes. ENT: No sore throat. Cardiovascular: Denies chest pain. Respiratory: Denies shortness of breath. Gastrointestinal:  No abdominal pain.  No nausea, no vomiting.  No diarrhea.  No constipation. Genitourinary: Negative for dysuria. Musculoskeletal: Negative for back pain. Skin: Negative for rash. Neurological: Negative for headaches, focal weakness or numbness.  10-point ROS otherwise negative.  ____________________________________________   PHYSICAL EXAM:  VITAL SIGNS: ED Triage Vitals  Enc Vitals Group     BP 11/09/14 2115 100/45 mmHg     Pulse Rate 11/09/14 2115 90     Resp 11/09/14 2115 20     Temp --      Temp Source 11/09/14 2115 Oral     SpO2 11/09/14 2115 94 %     Weight 11/09/14 2115 94 lb (42.638 kg)     Height 11/09/14 2115  (1.575 m)      Head Cir --      Peak Flow --      Pain Score 11/09/14 2117 9     Pain Loc --      Pain Edu? --      Excl. in GC? --     Constitutional: Alert and oriented. Well appearing and in no acute distress. Sitting up in bed eating a [place] sandwich and chips. Eyes: Conjunctivae are normal. PERRL. EOMI. Head: Atraumatic. Nose: No congestion/rhinnorhea. Mouth/Throat: Mucous membranes are moist.  Oropharynx non-erythematous. Neck: No stridor.  .Cardiovascular: Normal rate, regular rhythm. Grossly normal heart sounds.  Good peripheral circulation. Respiratory: Normal respiratory effort.  No retractions. Lungs CTAB. Gastrointestinal: Soft and nontender. No distention. No abdominal bruits. No CVA tenderness. Genitourinary: deferred Musculoskeletal: No cyanosis, clubbing or edema, symmetric palpable DP pulses bilaterally Neurologic:  Normal speech and language. No gross focal neurologic deficits are appreciated. Speech is normal. Skin:  Skin is warm, dry and intact. No rash noted. Psychiatric: Mood and affect are normal. Speech and behavior are normal.  ____________________________________________   LABS (all labs ordered are listed, but only abnormal results are displayed)  Labs Reviewed  CBC - Abnormal; Notable for the following:    WBC 16.5 (*)    All other components within normal limits  COMPREHENSIVE METABOLIC PANEL - Abnormal; Notable for the following:    Potassium 3.0 (*)    CO2 21 (*)    Glucose, Bld 203 (*)    BUN 22 (*)    Calcium 8.2 (*)    Total Protein 6.2 (*)    Albumin 3.3 (*)    ALT 9 (*)    Total Bilirubin 0.1 (*)    All other components within normal limits  GLUCOSE, CAPILLARY - Abnormal; Notable for the following:    Glucose-Capillary 23 (*)    All other components within normal limits  GLUCOSE, CAPILLARY - Abnormal; Notable for the following:    Glucose-Capillary 61 (*)    All other components within normal limits  GLUCOSE, CAPILLARY  URINALYSIS COMPLETEWITH  MICROSCOPIC ([facility])   APTT  PROTIME-INR  CBG MONITORING, ED   ____________________________________________  EKG  ED ECG REPORT   Date: 11/09/2014  EKG Time: 23:06  Rate: 78  Rhythm:  normal sinus rhythm  Axis: normal  Intervals:none  ST&T Change: prolonged QT which is chronic/unchanged from 10/2014  ____________________________________________  RADIOLOGY  none ____________________________________________   PROCEDURES  Procedure(s) performed: None  Critical Care performed: Yes, see critical care note(s). Total critical care time spent 40 minutes.  ____________________________________________   INITIAL IMPRESSION / ASSESSMENT AND PLAN / ED COURSE  Pertinent labs & imaging results that were available during my care of the patient were reviewed by me and  considered in my medical decision making (see chart for details).  April [name] is a 59 y.o. female with past medical history significant for COPD, diabetes, hyperlipidemia, bilateral DVT on Coumadin, presents for evaluation of altered mental status was likely secondary to hypoglycemia. She is alert and oriented  4, sitting up in bed and eating a sandwich, intact neurological examination after D50. We'll obtain basic labs, screening EKG and observe her in the ER. If she is able to maintain blood sugar, anticipate discharge with PCP f/u.  ----------------------------------------- 11:43 PM on 11/09/2014 -----------------------------------------  Patient continues to feel well. Blood glucose 74. Labs notable for leukocytosis which is likely reactive/catecholamine induced but given her fatigue today, advance age we'll obtain chest x-ray and UA to rule out infectious process. Care transferred to Dr. Manson PasseyBrown. Anticipate discharge if her blood glucose remains stable. ____________________________________________   FINAL CLINICAL IMPRESSION(S) / ED DIAGNOSES  Final diagnoses:  Hypoglycemia      Gayla DossEryka A Ranie Chinchilla,  MD 11/09/14 2344

## 2014-11-09 NOTE — ED Notes (Signed)
Pt to triage room #3; pt currently unresponsive, diaphoretic, drooling, with snoring respirations. FSBS 23 in triage. D50 administered emergently; pt became responsive immediately after given. Talkative and answering questions. States she has eaten today, but not recently. Pt reports she took her insulin this morning. Pt currently c/o left lower extremity pain due to known DVT.

## 2014-11-10 LAB — URINALYSIS COMPLETE WITH MICROSCOPIC (ARMC ONLY)
Bilirubin Urine: NEGATIVE
Glucose, UA: >500 mg/dL — AB
HGB URINE DIPSTICK: NEGATIVE
Ketones, ur: NEGATIVE mg/dL
NITRITE: NEGATIVE
PROTEIN: NEGATIVE mg/dL
Specific Gravity, Urine: 1.017 (ref 1.005–1.030)
pH: 5 (ref 5.0–8.0)

## 2014-11-10 LAB — GLUCOSE, CAPILLARY
GLUCOSE-CAPILLARY: 261 mg/dL — AB (ref 70–99)
Glucose-Capillary: 161 mg/dL — ABNORMAL HIGH (ref 70–99)

## 2014-11-10 MED ORDER — SULFAMETHOXAZOLE-TRIMETHOPRIM 800-160 MG PO TABS
1.0000 | ORAL_TABLET | Freq: Once | ORAL | Status: AC
  Administered 2014-11-10: 1 via ORAL

## 2014-11-10 MED ORDER — CIPROFLOXACIN HCL 500 MG PO TABS: 500.0000 mg | ORAL_TABLET | Freq: Once | ORAL | Status: DC

## 2014-11-10 MED ORDER — SULFAMETHOXAZOLE-TRIMETHOPRIM 800-160 MG PO TABS
ORAL_TABLET | ORAL | Status: AC
  Administered 2014-11-10: 1 via ORAL
  Filled 2014-11-10: qty 1

## 2014-11-10 MED ORDER — FOSFOMYCIN TROMETHAMINE 3 G PO PACK
3.0000 g | PACK | Freq: Once | ORAL | Status: AC
  Administered 2014-11-10: 3 g via ORAL

## 2014-11-10 MED ORDER — FOSFOMYCIN TROMETHAMINE 3 G PO PACK
PACK | ORAL | Status: AC
  Administered 2014-11-10: 3 g via ORAL
  Filled 2014-11-10: qty 3

## 2014-11-10 NOTE — Discharge Instructions (Signed)

## 2014-11-11 ENCOUNTER — Ambulatory Visit: Payer: Self-pay | Admitting: Internal Medicine

## 2014-11-19 ENCOUNTER — Ambulatory Visit: Payer: Self-pay | Admitting: Internal Medicine

## 2014-11-26 ENCOUNTER — Ambulatory Visit: Payer: Medicare Other | Admitting: Hematology and Oncology

## 2014-12-13 ENCOUNTER — Encounter: Payer: Self-pay | Admitting: *Deleted

## 2015-01-17 ENCOUNTER — Other Ambulatory Visit: Payer: Self-pay

## 2015-01-24 ENCOUNTER — Inpatient Hospital Stay: Payer: Medicare Other | Attending: Hematology and Oncology | Admitting: Hematology and Oncology

## 2015-02-22 ENCOUNTER — Other Ambulatory Visit: Payer: Self-pay

## 2015-02-22 ENCOUNTER — Emergency Department
Admission: EM | Admit: 2015-02-22 | Discharge: 2015-02-22 | Discharge status: Against Medical Advice | Payer: Medicare Other | Attending: Emergency Medicine | Admitting: Emergency Medicine

## 2015-02-22 ENCOUNTER — Emergency Department: Payer: Medicare Other

## 2015-02-22 ENCOUNTER — Encounter: Payer: Self-pay | Admitting: Emergency Medicine

## 2015-02-22 DIAGNOSIS — J441 Chronic obstructive pulmonary disease with (acute) exacerbation: Secondary | ICD-10-CM | POA: Diagnosis not present

## 2015-02-22 DIAGNOSIS — Z720 Tobacco use: Secondary | ICD-10-CM | POA: Diagnosis not present

## 2015-02-22 DIAGNOSIS — R1030 Lower abdominal pain, unspecified: Secondary | ICD-10-CM | POA: Diagnosis not present

## 2015-02-22 DIAGNOSIS — E119 Type 2 diabetes mellitus without complications: Secondary | ICD-10-CM | POA: Insufficient documentation

## 2015-02-22 DIAGNOSIS — R0602 Shortness of breath: Secondary | ICD-10-CM | POA: Diagnosis present

## 2015-02-22 LAB — COMPREHENSIVE METABOLIC PANEL
ALT: 13 U/L — ABNORMAL LOW (ref 14–54)
AST: 19 U/L (ref 15–41)
Albumin: 3.4 g/dL — ABNORMAL LOW (ref 3.5–5.0)
Alkaline Phosphatase: 74 U/L (ref 38–126)
Anion gap: 9 (ref 5–15)
BILIRUBIN TOTAL: 0.2 mg/dL — AB (ref 0.3–1.2)
BUN: 13 mg/dL (ref 6–20)
CO2: 27 mmol/L (ref 22–32)
Calcium: 8.6 mg/dL — ABNORMAL LOW (ref 8.9–10.3)
Chloride: 102 mmol/L (ref 101–111)
Creatinine, Ser: 0.71 mg/dL (ref 0.44–1.00)
GFR calc Af Amer: >60 mL/min (ref >60)
Glucose, Bld: 314 mg/dL — ABNORMAL HIGH (ref 65–99)
POTASSIUM: 3.9 mmol/L (ref 3.5–5.1)
Sodium: 138 mmol/L (ref 135–145)
TOTAL PROTEIN: 6.3 g/dL — AB (ref 6.5–8.1)

## 2015-02-22 LAB — CBC
HEMATOCRIT: 42 % (ref 35.0–47.0)
Hemoglobin: 14.3 g/dL (ref 12.0–16.0)
MCH: 32.7 pg (ref 26.0–34.0)
MCHC: 34 g/dL (ref 32.0–36.0)
MCV: 96.1 fL (ref 80.0–100.0)
Platelets: 148 10*3/uL — ABNORMAL LOW (ref 150–440)
RBC: 4.37 MIL/uL (ref 3.80–5.20)
RDW: 13.4 % (ref 11.5–14.5)
WBC: 8.2 10*3/uL (ref 3.6–11.0)

## 2015-02-22 LAB — LIPASE, BLOOD: Lipase: 25 U/L (ref 22–51)

## 2015-02-22 NOTE — ED Provider Notes (Signed)
I did not participate in the care of this patient  Sharyn Creamer, MD 02/23/15 [phone]

## 2015-02-22 NOTE — ED Notes (Signed)
Says sob at night.  Also pain in low stomach and thinks she has infection again.  Unable to tell me what kind of infection.  Pt says she has pcp at [facility], but she did not have co pay.

## 2015-03-03 ENCOUNTER — Ambulatory Visit: Payer: Medicare Other | Admitting: Hematology and Oncology

## 2015-03-04 ENCOUNTER — Inpatient Hospital Stay: Payer: Medicare Other | Attending: Hematology and Oncology | Admitting: Hematology and Oncology

## 2015-03-04 ENCOUNTER — Other Ambulatory Visit: Payer: Self-pay | Admitting: Hematology and Oncology

## 2015-03-04 DIAGNOSIS — R970 Elevated carcinoembryonic antigen [CEA]: Secondary | ICD-10-CM

## 2015-03-04 DIAGNOSIS — I82402 Acute embolism and thrombosis of unspecified deep veins of left lower extremity: Secondary | ICD-10-CM

## 2015-03-08 ENCOUNTER — Encounter: Payer: Self-pay | Admitting: Hematology and Oncology

## 2015-03-14 ENCOUNTER — Emergency Department
Admission: EM | Admit: 2015-03-14 | Discharge: 2015-03-14 | Disposition: A | Discharge status: Home / Self Care | Payer: Medicare Other | Attending: Emergency Medicine | Admitting: Emergency Medicine

## 2015-03-14 ENCOUNTER — Emergency Department: Payer: Medicare Other

## 2015-03-14 ENCOUNTER — Encounter: Payer: Self-pay | Admitting: Emergency Medicine

## 2015-03-14 DIAGNOSIS — E119 Type 2 diabetes mellitus without complications: Secondary | ICD-10-CM | POA: Diagnosis not present

## 2015-03-14 DIAGNOSIS — Z720 Tobacco use: Secondary | ICD-10-CM | POA: Diagnosis not present

## 2015-03-14 DIAGNOSIS — R05 Cough: Secondary | ICD-10-CM | POA: Diagnosis present

## 2015-03-14 DIAGNOSIS — Z794 Long term (current) use of insulin: Secondary | ICD-10-CM | POA: Insufficient documentation

## 2015-03-14 DIAGNOSIS — Z7901 Long term (current) use of anticoagulants: Secondary | ICD-10-CM | POA: Insufficient documentation

## 2015-03-14 DIAGNOSIS — J441 Chronic obstructive pulmonary disease with (acute) exacerbation: Secondary | ICD-10-CM | POA: Insufficient documentation

## 2015-03-14 DIAGNOSIS — J209 Acute bronchitis, unspecified: Secondary | ICD-10-CM

## 2015-03-14 LAB — CBC WITH DIFFERENTIAL/PLATELET
Basophils Absolute: 0 10*3/uL (ref 0–0.1)
Basophils Relative: 0 %
EOS ABS: 0.3 10*3/uL (ref 0–0.7)
Eosinophils Relative: 3 %
HEMATOCRIT: 42.3 % (ref 35.0–47.0)
HEMOGLOBIN: 14.2 g/dL (ref 12.0–16.0)
LYMPHS ABS: 2.9 10*3/uL (ref 1.0–3.6)
Lymphocytes Relative: 36 %
MCH: 32.4 pg (ref 26.0–34.0)
MCHC: 33.6 g/dL (ref 32.0–36.0)
MCV: 96.5 fL (ref 80.0–100.0)
MONO ABS: 0.6 10*3/uL (ref 0.2–0.9)
MONOS PCT: 8 %
NEUTROS PCT: 53 %
Neutro Abs: 4.2 10*3/uL (ref 1.4–6.5)
Platelets: 195 10*3/uL (ref 150–440)
RBC: 4.38 MIL/uL (ref 3.80–5.20)
RDW: 13.2 % (ref 11.5–14.5)
WBC: 7.9 10*3/uL (ref 3.6–11.0)

## 2015-03-14 LAB — URINALYSIS COMPLETE WITH MICROSCOPIC (ARMC ONLY)
Bacteria, UA: NONE SEEN
Bilirubin Urine: NEGATIVE
Glucose, UA: >500 mg/dL — AB
Hgb urine dipstick: NEGATIVE
Ketones, ur: NEGATIVE mg/dL
Nitrite: NEGATIVE
PH: 6 (ref 5.0–8.0)
PROTEIN: NEGATIVE mg/dL
Specific Gravity, Urine: 1.01 (ref 1.005–1.030)

## 2015-03-14 MED ORDER — ALBUTEROL SULFATE HFA 108 (90 BASE) MCG/ACT IN AERS: 2.0000 | INHALATION_SPRAY | Freq: Four times a day (QID) | RESPIRATORY_TRACT | Status: DC | PRN

## 2015-03-14 MED ORDER — FLUTICASONE-SALMETEROL 250-50 MCG/DOSE IN AEPB: 1.0000 | INHALATION_SPRAY | Freq: Two times a day (BID) | RESPIRATORY_TRACT | Status: AC

## 2015-03-14 MED ORDER — GUAIFENESIN-CODEINE 100-10 MG/5ML PO SOLN: 10.0000 mL | ORAL | Status: DC | PRN

## 2015-03-14 NOTE — Discharge Instructions (Signed)

## 2015-03-14 NOTE — ED Provider Notes (Signed)
[facility] Emergency Department Provider Note  ____________________________________________  Time seen: Approximately 2:38 PM  I have reviewed the triage vital signs and the nursing notes.   HISTORY  Chief Complaint Cough    HPI April [name] is a 59 y.o. female who presents for evaluation of cough times several days. Patient states that she took her last CPAP pill this morning. Continues to cough productive green sputum. Feel short of breath at nighttime. In addition she states that she's having some lower abdominal pain but doesn't know where it is coming from.   Past Medical History  Diagnosis Date  . Diabetes mellitus without complication   . Hyperlipidemia   . Anxiety   . COPD (chronic obstructive pulmonary disease)   . Tobacco abuse   . History of colonic polyps   . DVT (deep venous thrombosis)   . Pulmonary nodules/lesions, multiple 08/2012    NEGATIVE PET SCAN  . Retroperitoneal abscess     history of in 04/2013  . Colon polyps     Patient Active Problem List   Diagnosis Date Noted  . Elevated CEA 01/30/2014    Past Surgical History  Procedure Laterality Date  . Colonoscopy  05/2007    Current Outpatient Rx  Name  Route  Sig  Dispense  Refill  . albuterol (PROVENTIL HFA;VENTOLIN HFA) 108 (90 BASE) MCG/ACT inhaler   Inhalation   Inhale 2 puffs into the lungs every 4 (four) hours as needed for wheezing or shortness of breath.         . Fluticasone-Salmeterol (ADVAIR) 250-50 MCG/DOSE AEPB   Inhalation   Inhale 1 puff into the lungs 2 (two) times daily.         Marland Kitchen guaiFENesin-codeine 100-10 MG/5ML syrup   Oral   Take 10 mLs by mouth every 4 (four) hours as needed for cough.   180 mL   0   . insulin aspart (NOVOLOG) 100 UNIT/ML FlexPen   Subcutaneous   Inject 20-60 Units into the skin 3 (three) times daily. Pt uses 20 units in the morning, 20 units at lunch, and 60 units at bedtime.         . Insulin Detemir (LEVEMIR)  100 UNIT/ML Pen   Subcutaneous   Inject 10 Units into the skin at bedtime.         Marland Kitchen warfarin (COUMADIN) 1 MG tablet   Oral   Take 6 mg by mouth daily.            Allergies Eggs or egg-derived products  Family History  Problem Relation Age of Onset  . Brain cancer Mother     Social History Social History  Substance Use Topics  . Smoking status: Current Every Day Smoker -- 0.50 packs/day for 45 years    Types: Cigarettes  . Smokeless tobacco: None     Comment: Smoking History 2.5-started at age 54  . Alcohol Use: No    Review of Systems Constitutional: No fever/chills Eyes: No visual changes. ENT: No sore throat. Cardiovascular: Denies chest pain. Respiratory: Occasional shortness of breath Gastrointestinal: No abdominal pain.  No nausea, no vomiting.  No diarrhea.  No constipation. Genitourinary: Negative for dysuria. Musculoskeletal: Negative for back pain. Skin: Negative for rash. Neurological: Negative for headaches, focal weakness or numbness.  10-point ROS otherwise negative.  ____________________________________________   PHYSICAL EXAM:  VITAL SIGNS: ED Triage Vitals  Enc Vitals Group     BP 03/14/15 1418 110/72 mmHg     Pulse Rate  03/14/15 1418 90     Resp 03/14/15 1418 20     Temp 03/14/15 1418 97.9 F (36.6 C)     Temp Source 03/14/15 1418 Oral     SpO2 03/14/15 1418 97 %     Weight 03/14/15 1418 72 lb (32.659 kg)     Height 03/14/15 1418  (1.575 m)     Head Cir --      Peak Flow --      Pain Score 03/14/15 1419 5     Pain Loc --      Pain Edu? --      Excl. in GC? --     Constitutional: Alert and oriented. Well appearing and in no acute distress. Eyes: Conjunctivae are normal. PERRL. EOMI. Head: Atraumatic. Nose: No congestion/rhinnorhea. Mouth/Throat: Mucous membranes are moist.  Oropharynx non-erythematous. Neck: No stridor.   Cardiovascular: Normal rate, regular rhythm. Grossly normal heart sounds.  Good peripheral  circulation. Respiratory: Normal respiratory effort.  No retractions. Decreased breath sounds bilaterally. Musculoskeletal: No lower extremity tenderness nor edema.  No joint effusions. Neurologic:  Normal speech and language. No gross focal neurologic deficits are appreciated. No gait instability. Skin:  Skin is warm, dry and intact. No rash noted. Psychiatric: Mood and affect are normal. Speech and behavior are normal.  ____________________________________________   LABS (all labs ordered are listed, but only abnormal results are displayed)  Labs Reviewed  URINALYSIS COMPLETEWITH MICROSCOPIC ([facility] ONLY) - Abnormal; Notable for the following:    Color, Urine YELLOW (*)    APPearance HAZY (*)    Glucose, UA >500 (*)    Leukocytes, UA 1+ (*)    Squamous Epithelial / LPF 6-30 (*)    All other components within normal limits  CBC WITH DIFFERENTIAL/PLATELET   ____________________________________________  RADIOLOGY  Increased bronchial markings no acute findings.  ____________________________________________   PROCEDURES  Procedure(s) performed: None  Critical Care performed: No  ____________________________________________   INITIAL IMPRESSION / ASSESSMENT AND PLAN / ED COURSE  Pertinent labs & imaging results that were available during my care of the patient were reviewed by me and considered in my medical decision making (see chart for details).   Increased bronchial markings no acute findings. Rx given for Robitussin-AC for cough. Reassurance provided patient to return to the ER follow up with PCP. Patient voices no other emergency medical complaints at this time ____________________________________________   FINAL CLINICAL IMPRESSION(S) / ED DIAGNOSES  Final diagnoses:  Acute bronchitis, unspecified organism      Evangeline Dakin, PA-C 03/14/15 1520  Jennye Moccasin, MD 03/14/15 1523

## 2015-03-14 NOTE — ED Notes (Signed)
States she just needs something for cough so she can rest. Just finished antibiotics

## 2015-03-14 NOTE — ED Notes (Signed)
Cough for 2 wks, yellow mucus.  No resp distress.  States her MD put her on an antibiotic but she is still coughing all night and cant sleep.

## 2015-05-09 ENCOUNTER — Inpatient Hospital Stay: Payer: Medicare Other | Attending: Internal Medicine | Admitting: Internal Medicine

## 2015-05-09 ENCOUNTER — Encounter: Payer: Self-pay | Admitting: *Deleted

## 2015-05-09 NOTE — Progress Notes (Signed)
3rd no show (on 05/09/15) with md  with cancer center. Dr. Donneta RombergBrahmanday will send letter to patient. Will not r/s this appointment at this time.  Certified mail tracking # is [id number] [phone]

## 2015-05-17 ENCOUNTER — Telehealth: Payer: Self-pay | Admitting: *Deleted

## 2015-05-17 NOTE — Telephone Encounter (Signed)
Patient called scheduling on 05/16/15 requesting "any appointment with Dr. Donneta RombergBrahmanday". The patient was a 3rd no show  05/09/15. She received the certified letter in mail and is requesting to reinstate her appointments with md.  RN spoke with Dr. Donneta RombergBrahmanday. MD willing to have patient r/s. However, should the patient not show up for this r/s appointment, her chart will be "officially closed to services at that time."

## 2015-06-09 ENCOUNTER — Inpatient Hospital Stay: Payer: Medicare Other

## 2015-06-09 ENCOUNTER — Inpatient Hospital Stay: Payer: Medicare Other | Admitting: Internal Medicine

## 2015-06-15 ENCOUNTER — Inpatient Hospital Stay (HOSPITAL_BASED_OUTPATIENT_CLINIC_OR_DEPARTMENT_OTHER): Payer: Medicare Other | Admitting: Internal Medicine

## 2015-06-15 ENCOUNTER — Inpatient Hospital Stay: Payer: Medicare Other | Attending: Internal Medicine

## 2015-06-15 VITALS — BP 123/74 | HR 83 | Temp 98.2°F | Ht 62.0 in | Wt 98.1 lb

## 2015-06-15 DIAGNOSIS — Z9119 Patient's noncompliance with other medical treatment and regimen: Secondary | ICD-10-CM

## 2015-06-15 DIAGNOSIS — R918 Other nonspecific abnormal finding of lung field: Secondary | ICD-10-CM | POA: Diagnosis not present

## 2015-06-15 DIAGNOSIS — Z86718 Personal history of other venous thrombosis and embolism: Secondary | ICD-10-CM | POA: Diagnosis not present

## 2015-06-15 DIAGNOSIS — Z7901 Long term (current) use of anticoagulants: Secondary | ICD-10-CM | POA: Diagnosis not present

## 2015-06-15 DIAGNOSIS — E785 Hyperlipidemia, unspecified: Secondary | ICD-10-CM | POA: Insufficient documentation

## 2015-06-15 DIAGNOSIS — F419 Anxiety disorder, unspecified: Secondary | ICD-10-CM

## 2015-06-15 DIAGNOSIS — J449 Chronic obstructive pulmonary disease, unspecified: Secondary | ICD-10-CM

## 2015-06-15 DIAGNOSIS — Z86010 Personal history of colonic polyps: Secondary | ICD-10-CM | POA: Diagnosis not present

## 2015-06-15 DIAGNOSIS — R970 Elevated carcinoembryonic antigen [CEA]: Secondary | ICD-10-CM

## 2015-06-15 DIAGNOSIS — Z79899 Other long term (current) drug therapy: Secondary | ICD-10-CM | POA: Insufficient documentation

## 2015-06-15 DIAGNOSIS — E119 Type 2 diabetes mellitus without complications: Secondary | ICD-10-CM | POA: Diagnosis not present

## 2015-06-15 DIAGNOSIS — I82402 Acute embolism and thrombosis of unspecified deep veins of left lower extremity: Secondary | ICD-10-CM

## 2015-06-15 DIAGNOSIS — F17210 Nicotine dependence, cigarettes, uncomplicated: Secondary | ICD-10-CM | POA: Insufficient documentation

## 2015-06-15 DIAGNOSIS — Z794 Long term (current) use of insulin: Secondary | ICD-10-CM | POA: Insufficient documentation

## 2015-06-15 LAB — COMPREHENSIVE METABOLIC PANEL
ALT: 10 U/L — ABNORMAL LOW (ref 14–54)
AST: 12 U/L — ABNORMAL LOW (ref 15–41)
Albumin: 3.9 g/dL (ref 3.5–5.0)
Alkaline Phosphatase: 79 U/L (ref 38–126)
Anion gap: 6 (ref 5–15)
BUN: 24 mg/dL — ABNORMAL HIGH (ref 6–20)
CO2: 30 mmol/L (ref 22–32)
Calcium: 9.1 mg/dL (ref 8.9–10.3)
Chloride: 102 mmol/L (ref 101–111)
Creatinine, Ser: 0.84 mg/dL (ref 0.44–1.00)
GFR calc Af Amer: >60 mL/min (ref >60)
GFR calc non Af Amer: >60 mL/min (ref >60)
Glucose, Bld: 268 mg/dL — ABNORMAL HIGH (ref 65–99)
Potassium: 4.2 mmol/L (ref 3.5–5.1)
Sodium: 138 mmol/L (ref 135–145)
Total Bilirubin: 0.3 mg/dL (ref 0.3–1.2)
Total Protein: 7.1 g/dL (ref 6.5–8.1)

## 2015-06-15 LAB — CBC WITH DIFFERENTIAL/PLATELET
Basophils Absolute: 0 10*3/uL (ref 0–0.1)
Basophils Relative: 0 %
Eosinophils Absolute: 0.3 10*3/uL (ref 0–0.7)
Eosinophils Relative: 4 %
HCT: 44.7 % (ref 35.0–47.0)
Hemoglobin: 15 g/dL (ref 12.0–16.0)
Lymphocytes Relative: 33 %
Lymphs Abs: 2.3 10*3/uL (ref 1.0–3.6)
MCH: 32.4 pg (ref 26.0–34.0)
MCHC: 33.7 g/dL (ref 32.0–36.0)
MCV: 96.2 fL (ref 80.0–100.0)
Monocytes Absolute: 0.6 10*3/uL (ref 0.2–0.9)
Monocytes Relative: 9 %
Neutro Abs: 3.9 10*3/uL (ref 1.4–6.5)
Neutrophils Relative %: 54 %
Platelets: 172 10*3/uL (ref 150–440)
RBC: 4.64 MIL/uL (ref 3.80–5.20)
RDW: 13.3 % (ref 11.5–14.5)
WBC: 7.2 10*3/uL (ref 3.6–11.0)

## 2015-06-15 LAB — PROTIME-INR
INR: 0.96
PROTHROMBIN TIME: 13 s (ref 11.4–15.0)

## 2015-06-15 NOTE — Progress Notes (Signed)
[facility] OFFICE PROGRESS NOTE  Patient Care Team: No Pcp Per Patient as PCP - General (General Practice)/Charles drew   SUMMARY OF ONCOLOGIC HISTORY:  # 2014- PROVOKED DVT LLE on coumadin [non-compliant with f/u]; Prothrombin gene/factor V leiden- NEG  # SMOKING/ Hx of Lung nodules [2014- PET neg]; COPD  INTERVAL HISTORY:  This is my first interaction with the patient since I joined the practice September 2016. I reviewed the patient's prior charts/pertinent labs/imaging in detail; findings are summarized above.    59 year old female patient with multiple medical problems;  Unfortunately continues to smoke  And a history of left lower extremity DVT 2014 is here for follow-up/ reestablish care after multiple missed appointments. Patient is on Coumadin;  Unfortunately no frequent INR checks.   Patient complains of worsening pain in the  Left lower extremity/ swelling.  Has poor appetite. Lost some weight.  Also  Chronic shortness of breath chronic cough, hemoptysis.  REVIEW OF SYSTEMS:  A complete 10 point review of system is done which is negative except mentioned above/history of present illness.   PAST MEDICAL HISTORY :  Past Medical History  Diagnosis Date  . Diabetes mellitus without complication   . Hyperlipidemia   . Anxiety   . COPD (chronic obstructive pulmonary disease)   . Tobacco abuse   . History of colonic polyps   . DVT (deep venous thrombosis)   . Pulmonary nodules/lesions, multiple 08/2012    NEGATIVE PET SCAN  . Retroperitoneal abscess     history of in 04/2013  . Colon polyps     PAST SURGICAL HISTORY :   Past Surgical History  Procedure Laterality Date  . Colonoscopy  05/2007    FAMILY HISTORY :   Family History  Problem Relation Age of Onset  . Brain cancer Mother     SOCIAL HISTORY:   Social History  Substance Use Topics  . Smoking status: Current Every Day Smoker -- 0.50 packs/day for 45 years    Types: Cigarettes  .  Smokeless tobacco: Not on file     Comment: Smoking History 2.5-started at age 59  . Alcohol Use: No    ALLERGIES:  is allergic to eggs or egg-derived products.  MEDICATIONS:  Current Outpatient Prescriptions  Medication Sig Dispense Refill  . Fluticasone-Salmeterol (ADVAIR DISKUS) 250-50 MCG/DOSE AEPB Inhale 1 puff into the lungs 2 (two) times daily. 1 each 0  . insulin aspart (NOVOLOG) 100 UNIT/ML FlexPen Inject 20-60 Units into the skin 3 (three) times daily. Pt uses 20 units in the morning, 20 units at lunch, and 60 units at bedtime.    . Insulin Detemir (LEVEMIR) 100 UNIT/ML Pen Inject 10 Units into the skin at bedtime.    Marland Kitchen. warfarin (COUMADIN) 1 MG tablet Take 6 mg by mouth daily.     Marland Kitchen. albuterol (PROVENTIL) (2.5 MG/3ML) 0.083% nebulizer solution Inhale into the lungs.    . metoCLOPramide (REGLAN) 10 MG tablet Take by mouth.    . oxyCODONE-acetaminophen (PERCOCET) 10-325 MG tablet Take by mouth.     No current facility-administered medications for this visit.    PHYSICAL EXAMINATION:   BP 123/74 mmHg  Pulse 83  Temp(Src) 98.2 F (36.8 C) (Tympanic)  Ht 5\' 2"  (1.575 m)  Wt 98 lb 1.7 oz (44.5 kg)  BMI 17.94 kg/m2  Filed Weights   06/15/15 1158  Weight: 98 lb 1.7 oz (44.5 kg)    GENERAL:  Cachectic poorly nourished female patient Alert, no distress and comfortable.  EYES: no pallor or icterus OROPHARYNX: no thrush or ulceration; poor dentition NECK: supple, no masses felt LYMPH:  no palpable lymphadenopathy in the cervical, axillary or inguinal regions LUNGS:  Decreased breath sounds bilaterally. HEART/CVS: regular rate & rhythm and no murmurs;  Mild swelling noted of the left lower extremity ABDOMEN:abdomen soft, non-tender and normal bowel sounds Musculoskeletal:no cyanosis of digits and no clubbing  PSYCH: alert & oriented x 3 with fluent speech NEURO: no focal motor/sensory deficits SKIN:  no rashes or significant lesions  LABORATORY DATA:  I have reviewed  the data as listed    Component Value Date/Time   NA 138 06/15/2015 1122   NA 142 10/14/2014 0644   K 4.2 06/15/2015 1122   K 3.6 10/14/2014 0644   CL 102 06/15/2015 1122   CL 109 10/14/2014 0644   CO2 30 06/15/2015 1122   CO2 29 10/14/2014 0644   GLUCOSE 268* 06/15/2015 1122   GLUCOSE 142* 10/14/2014 0644   BUN 24* 06/15/2015 1122   BUN 9 10/14/2014 0644   CREATININE 0.84 06/15/2015 1122   CREATININE 0.53 10/14/2014 0644   CALCIUM 9.1 06/15/2015 1122   CALCIUM 7.7* 10/14/2014 0644   PROT 7.1 06/15/2015 1122   PROT 5.4* 10/14/2014 0644   ALBUMIN 3.9 06/15/2015 1122   ALBUMIN 2.7* 10/14/2014 0644   AST 12* 06/15/2015 1122   AST 11* 10/14/2014 0644   ALT 10* 06/15/2015 1122   ALT 8* 10/14/2014 0644   ALKPHOS 79 06/15/2015 1122   ALKPHOS 87 10/14/2014 0644   BILITOT 0.3 06/15/2015 1122   BILITOT <0.1* 10/14/2014 0644   GFRNONAA >60 06/15/2015 1122   GFRNONAA >60 10/14/2014 0644   GFRAA >60 06/15/2015 1122   GFRAA >60 10/14/2014 0644    No results found for: SPEP, UPEP  Lab Results  Component Value Date   WBC 7.2 06/15/2015   NEUTROABS 3.9 06/15/2015   HGB 15.0 06/15/2015   HCT 44.7 06/15/2015   MCV 96.2 06/15/2015   PLT 172 06/15/2015      Chemistry      Component Value Date/Time   NA 138 06/15/2015 1122   NA 142 10/14/2014 0644   K 4.2 06/15/2015 1122   K 3.6 10/14/2014 0644   CL 102 06/15/2015 1122   CL 109 10/14/2014 0644   CO2 30 06/15/2015 1122   CO2 29 10/14/2014 0644   BUN 24* 06/15/2015 1122   BUN 9 10/14/2014 0644   CREATININE 0.84 06/15/2015 1122   CREATININE 0.53 10/14/2014 0644      Component Value Date/Time   CALCIUM 9.1 06/15/2015 1122   CALCIUM 7.7* 10/14/2014 0644   ALKPHOS 79 06/15/2015 1122   ALKPHOS 87 10/14/2014 0644   AST 12* 06/15/2015 1122   AST 11* 10/14/2014 0644   ALT 10* 06/15/2015 1122   ALT 8* 10/14/2014 0644   BILITOT 0.3 06/15/2015 1122   BILITOT <0.1* 10/14/2014 0644       RADIOGRAPHIC STUDIES: I have  personally reviewed the radiological images as listed and agreed with the findings in the report. No results found.   ASSESSMENT & PLAN:   #  Left lower extremity DVT 2014;  this seems to be provoked after abdominal surgery.  Patient has been on Coumadin since then. Patient has been noncompliant/  Unfortunately has social issues With rides/ getting her PT/INR checked frequently.  Check PT/INR today.  Also get an ultrasound of the left lower extremity as the swelling is getting worse/ As per the patient.  Also  long discussion the patient-  Regarding the duration of anticoagulation.  Given the significant concerns  With frequent blood checks/ follow-ups  The fact that DVT was provoked-  I would be inclined towards stopping Coumadin it is no acute clot noted  On the repeat ultrasound.  #  Elevated CEA-  Question from smoking.  CEA from today is pending. Prior colonoscopy- neg.   #  Patient follow-up with me in approximately  One week/  With the ultrasound of the leg.   # recommend to quit smoking.   # 25 minutes face-to-face with the patient discussing the above plan of care; more than 50% of time spent on prognosis/ natural history; counseling and coordination.     Earna Coder, MD 06/15/2015 12:25 PM

## 2015-06-16 LAB — CEA: CEA: 8.3 ng/mL — ABNORMAL HIGH (ref 0.0–4.7)

## 2015-06-17 ENCOUNTER — Ambulatory Visit: Payer: Medicare Other

## 2015-06-17 LAB — PROTEIN S, TOTAL AND FREE
Protein S Ag, Free: 95 % (ref 57–157)
Protein S Ag, Total: 113 % (ref 60–150)

## 2015-06-17 LAB — PROTEIN S ACTIVITY: Protein S Activity: 78 % (ref 63–140)

## 2015-06-17 LAB — PROTEIN C ACTIVITY: Protein C Activity: 100 % (ref 73–180)

## 2015-06-17 LAB — PROTEIN C, TOTAL: Protein C, Total: 90 % (ref 60–150)

## 2015-06-22 ENCOUNTER — Inpatient Hospital Stay: Payer: Medicare Other

## 2015-06-22 ENCOUNTER — Inpatient Hospital Stay: Payer: Medicare Other | Admitting: Internal Medicine

## 2015-07-01 ENCOUNTER — Ambulatory Visit: Payer: Medicare Other

## 2015-07-01 ENCOUNTER — Inpatient Hospital Stay: Payer: Medicare Other

## 2015-07-01 ENCOUNTER — Other Ambulatory Visit: Payer: Medicare Other

## 2015-07-07 ENCOUNTER — Ambulatory Visit: Payer: Medicare Other

## 2015-07-08 ENCOUNTER — Ambulatory Visit: Payer: Medicare Other

## 2015-07-11 ENCOUNTER — Inpatient Hospital Stay: Payer: Medicare Other

## 2015-07-11 ENCOUNTER — Inpatient Hospital Stay: Payer: Medicare Other | Admitting: Internal Medicine

## 2015-07-12 ENCOUNTER — Encounter: Payer: Self-pay | Admitting: *Deleted

## 2015-07-21 ENCOUNTER — Other Ambulatory Visit: Payer: Medicare Other

## 2015-07-21 ENCOUNTER — Ambulatory Visit: Payer: Medicare Other | Admitting: Internal Medicine

## 2015-08-01 ENCOUNTER — Other Ambulatory Visit: Payer: Self-pay | Admitting: *Deleted

## 2015-08-01 DIAGNOSIS — Z86718 Personal history of other venous thrombosis and embolism: Secondary | ICD-10-CM

## 2015-09-12 ENCOUNTER — Ambulatory Visit: Payer: Medicare Other | Admitting: Cardiovascular Disease

## 2015-09-12 ENCOUNTER — Encounter: Payer: Self-pay | Admitting: *Deleted

## 2016-03-13 ENCOUNTER — Other Ambulatory Visit: Payer: Self-pay | Admitting: Gastroenterology

## 2016-03-13 DIAGNOSIS — R1310 Dysphagia, unspecified: Secondary | ICD-10-CM

## 2016-03-15 ENCOUNTER — Ambulatory Visit
Admission: RE | Admit: 2016-03-15 | Discharge: 2016-03-15 | Disposition: A | Discharge status: Home / Self Care | Payer: Medicare Other | Source: Ambulatory Visit | Attending: Gastroenterology | Admitting: Gastroenterology

## 2016-03-15 DIAGNOSIS — K449 Diaphragmatic hernia without obstruction or gangrene: Secondary | ICD-10-CM | POA: Diagnosis not present

## 2016-03-15 DIAGNOSIS — K228 Other specified diseases of esophagus: Secondary | ICD-10-CM | POA: Diagnosis not present

## 2016-03-15 DIAGNOSIS — R1310 Dysphagia, unspecified: Secondary | ICD-10-CM | POA: Insufficient documentation

## 2016-03-15 DIAGNOSIS — K219 Gastro-esophageal reflux disease without esophagitis: Secondary | ICD-10-CM | POA: Diagnosis not present

## 2016-03-16 ENCOUNTER — Encounter: Payer: Self-pay | Admitting: *Deleted

## 2016-03-19 ENCOUNTER — Encounter: Admission: RE | Payer: Self-pay | Source: Ambulatory Visit

## 2016-03-19 ENCOUNTER — Ambulatory Visit
Admission: RE | Admit: 2016-03-19 | Payer: Medicare Other | Source: Ambulatory Visit | Admitting: Unknown Physician Specialty

## 2016-03-19 SURGERY — COLONOSCOPY WITH PROPOFOL
Anesthesia: General

## 2016-05-10 ENCOUNTER — Encounter: Payer: Self-pay | Admitting: *Deleted

## 2016-05-11 ENCOUNTER — Encounter: Payer: Self-pay | Admitting: Anesthesiology

## 2016-05-11 ENCOUNTER — Encounter: Admission: RE | Payer: Self-pay | Source: Ambulatory Visit

## 2016-05-11 ENCOUNTER — Ambulatory Visit
Admission: RE | Admit: 2016-05-11 | Payer: Medicare Other | Source: Ambulatory Visit | Admitting: Unknown Physician Specialty

## 2016-05-11 HISTORY — DX: Peripheral vascular disease, unspecified: I73.9

## 2016-05-11 SURGERY — COLONOSCOPY WITH PROPOFOL
Anesthesia: General

## 2016-05-15 ENCOUNTER — Inpatient Hospital Stay: Payer: Medicare Other

## 2016-05-22 ENCOUNTER — Inpatient Hospital Stay: Payer: Medicare Other

## 2016-05-22 ENCOUNTER — Encounter: Payer: Self-pay | Admitting: *Deleted

## 2016-06-05 ENCOUNTER — Emergency Department
Admission: EM | Admit: 2016-06-05 | Discharge: 2016-06-05 | Disposition: A | Discharge status: Home / Self Care | Payer: Medicare Other | Attending: Emergency Medicine | Admitting: Emergency Medicine

## 2016-06-05 ENCOUNTER — Encounter: Payer: Self-pay | Admitting: Emergency Medicine

## 2016-06-05 DIAGNOSIS — Z79899 Other long term (current) drug therapy: Secondary | ICD-10-CM | POA: Insufficient documentation

## 2016-06-05 DIAGNOSIS — Z7901 Long term (current) use of anticoagulants: Secondary | ICD-10-CM | POA: Diagnosis not present

## 2016-06-05 DIAGNOSIS — M542 Cervicalgia: Secondary | ICD-10-CM | POA: Diagnosis present

## 2016-06-05 DIAGNOSIS — F17210 Nicotine dependence, cigarettes, uncomplicated: Secondary | ICD-10-CM | POA: Insufficient documentation

## 2016-06-05 DIAGNOSIS — J449 Chronic obstructive pulmonary disease, unspecified: Secondary | ICD-10-CM | POA: Insufficient documentation

## 2016-06-05 DIAGNOSIS — Z794 Long term (current) use of insulin: Secondary | ICD-10-CM | POA: Insufficient documentation

## 2016-06-05 DIAGNOSIS — E119 Type 2 diabetes mellitus without complications: Secondary | ICD-10-CM | POA: Insufficient documentation

## 2016-06-05 DIAGNOSIS — M62838 Other muscle spasm: Secondary | ICD-10-CM | POA: Diagnosis not present

## 2016-06-05 LAB — BASIC METABOLIC PANEL
ANION GAP: 9 (ref 5–15)
BUN: 28 mg/dL — ABNORMAL HIGH (ref 6–20)
CALCIUM: 9 mg/dL (ref 8.9–10.3)
CO2: 30 mmol/L (ref 22–32)
Chloride: 99 mmol/L — ABNORMAL LOW (ref 101–111)
Creatinine, Ser: 0.97 mg/dL (ref 0.44–1.00)
GFR calc Af Amer: >60 mL/min (ref >60)
GFR calc non Af Amer: >60 mL/min (ref >60)
GLUCOSE: 307 mg/dL — AB (ref 65–99)
POTASSIUM: 5.3 mmol/L — AB (ref 3.5–5.1)
Sodium: 138 mmol/L (ref 135–145)

## 2016-06-05 LAB — CBC
HEMATOCRIT: 40.6 % (ref 35.0–47.0)
HEMOGLOBIN: 14 g/dL (ref 12.0–16.0)
MCH: 32.7 pg (ref 26.0–34.0)
MCHC: 34.4 g/dL (ref 32.0–36.0)
MCV: 95 fL (ref 80.0–100.0)
Platelets: 243 10*3/uL (ref 150–440)
RBC: 4.27 MIL/uL (ref 3.80–5.20)
RDW: 13.9 % (ref 11.5–14.5)
WBC: 12.3 10*3/uL — ABNORMAL HIGH (ref 3.6–11.0)

## 2016-06-05 MED ORDER — IBUPROFEN 600 MG PO TABS: 600.0000 mg | ORAL_TABLET | Freq: Three times a day (TID) | ORAL | 0 refills | Status: DC | PRN

## 2016-06-05 MED ORDER — IBUPROFEN 600 MG PO TABS
600.0000 mg | ORAL_TABLET | Freq: Once | ORAL | Status: AC
  Administered 2016-06-05: 600 mg via ORAL
  Filled 2016-06-05: qty 1

## 2016-06-05 MED ORDER — CYCLOBENZAPRINE HCL 10 MG PO TABS
10.0000 mg | ORAL_TABLET | Freq: Once | ORAL | Status: AC
  Administered 2016-06-05: 10 mg via ORAL
  Filled 2016-06-05: qty 1

## 2016-06-05 MED ORDER — CYCLOBENZAPRINE HCL 10 MG PO TABS: 10.0000 mg | ORAL_TABLET | Freq: Three times a day (TID) | ORAL | 0 refills | Status: DC | PRN

## 2016-06-05 MED ORDER — HYDROCODONE-ACETAMINOPHEN 5-325 MG PO TABS
1.0000 | ORAL_TABLET | Freq: Once | ORAL | Status: AC
  Administered 2016-06-05: 1 via ORAL
  Filled 2016-06-05: qty 1

## 2016-06-05 NOTE — Discharge Instructions (Signed)
You were evaluated for left-sided neck pain and found to have a very tight and tender muscle spasm.  As we discussed, please return to the emergency department if you have any worsening pain, certainly any chest pain, trouble breathing, nausea, jaw pain, weakness, numbness, confusion altered mental status, dizziness or passing out.  You should speak with your primary doctor for follow-up, and I suspect you may need to be referred for physical therapy to help resolve the muscle spasm.  In the meantime heating pad and massage may be very helpful to help loosen this area up.

## 2016-06-05 NOTE — ED Provider Notes (Signed)
[facility] Emergency Department Provider Note ____________________________________________   I have reviewed the triage vital signs and the triage nursing note.  HISTORY  Chief Complaint Neck Pain   Historian Patient, boyfriend and daughter   HPI Marybelle KillingsDoris J Henshaw is a 60 y.o. female presents today withseveral days of left neck pain. She had no known trauma or overuse injury, but thinks that she may have slept on it wrong. He feels like a muscle spasm. There is pain up at the base of the skull on the left side of the neck and down into the top of the left shoulder. No jaw pain. No chest pain. No palpitations. No trouble breathing. No numbness or tingling. No nausea, or abdominal pain.  She's been trying ibuprofen at home, which sometimes helps. If it makes it worse.    Past Medical History:  Diagnosis Date  . Anxiety   . Colon polyps   . COPD (chronic obstructive pulmonary disease) (HCC)   . Diabetes mellitus without complication (HCC)   . DVT (deep venous thrombosis) (HCC)   . DVT (deep venous thrombosis) (HCC)   . History of colonic polyps   . Hyperlipidemia   . Peripheral artery disease (HCC)   . Pulmonary nodules/lesions, multiple 08/2012   NEGATIVE PET SCAN  . Retroperitoneal abscess (HCC)    history of in 04/2013  . Tobacco abuse     Patient Active Problem List   Diagnosis Date Noted  . Elevated CEA 01/30/2014    Past Surgical History:  Procedure Laterality Date  . CHOLECYSTECTOMY    . COLONOSCOPY  05/2007    Prior to Admission medications   Medication Sig Start Date End Date Taking? Authorizing Provider  albuterol (PROVENTIL) (2.5 MG/3ML) 0.083% nebulizer solution Inhale into the lungs.    Historical Provider, MD  cyclobenzaprine (FLEXERIL) 10 MG tablet Take 1 tablet (10 mg total) by mouth 3 (three) times daily as needed for muscle spasms. 06/05/16   Governor Rooksebecca Victoriah Wilds, MD  Fluticasone-Salmeterol (ADVAIR DISKUS) 250-50 MCG/DOSE AEPB Inhale 1  puff into the lungs 2 (two) times daily. 03/14/15 03/13/16  Charmayne Sheerharles M Beers, PA-C  glipiZIDE (GLUCOTROL) 5 MG tablet Take by mouth daily before breakfast.    Historical Provider, MD  ibuprofen (ADVIL,MOTRIN) 600 MG tablet Take 1 tablet (600 mg total) by mouth every 8 (eight) hours as needed. 06/05/16   Governor Rooksebecca Damiya Sandefur, MD  insulin aspart (NOVOLOG) 100 UNIT/ML FlexPen Inject 20-60 Units into the skin 3 (three) times daily. Pt uses 20 units in the morning, 20 units at lunch, and 60 units at bedtime.    Historical Provider, MD  Insulin Detemir (LEVEMIR) 100 UNIT/ML Pen Inject 10 Units into the skin at bedtime.    Historical Provider, MD  metoCLOPramide (REGLAN) 10 MG tablet Take by mouth.    Historical Provider, MD  nicotine (NICODERM CQ - DOSED IN MG/24 HOURS) 21 mg/24hr patch Place 21 mg onto the skin daily.    Historical Provider, MD  oxyCODONE-acetaminophen (PERCOCET) 10-325 MG tablet Take by mouth.    Historical Provider, MD  pantoprazole (PROTONIX) 40 MG tablet Take 40 mg by mouth daily.    Historical Provider, MD  senna (SENOKOT) 8.6 MG tablet Take 1 tablet by mouth daily.    Historical Provider, MD  simvastatin (ZOCOR) 10 MG tablet Take 10 mg by mouth daily.    Historical Provider, MD  warfarin (COUMADIN) 1 MG tablet Take 6 mg by mouth daily.     Historical Provider, MD  Allergies  Allergen Reactions  . Eggs Or Egg-Derived Products Diarrhea, Itching and Nausea And Vomiting    Family History  Problem Relation Age of Onset  . Brain cancer Mother     Social History Social History  Substance Use Topics  . Smoking status: Current Every Day Smoker    Packs/day: 0.50    Years: 45.00    Types: Cigarettes  . Smokeless tobacco: Never Used     Comment: Smoking History 2.5-started at age 44  . Alcohol use No    Review of Systems  Constitutional: Negative for fever. Eyes: Negative for visual changes. ENT: Negative for sore throat. Cardiovascular: Negative for chest pain. Respiratory:  Negative for shortness of breath. Gastrointestinal: Negative for abdominal pain, vomiting and diarrhea. Genitourinary: Negative for dysuria. Musculoskeletal: Negative for back pain. Skin: Negative for rash. Neurological: Negative for headache. 10 point Review of Systems otherwise negative ____________________________________________   PHYSICAL EXAM:  VITAL SIGNS: ED Triage Vitals  Enc Vitals Group     BP 06/05/16 1014 116/62     Pulse Rate 06/05/16 1014 (!) 110     Resp 06/05/16 1014 20     Temp 06/05/16 1014 98.3 F (36.8 C)     Temp Source 06/05/16 1014 Oral     SpO2 06/05/16 1014 96 %     Weight 06/05/16 1012 98 lb (44.5 kg)     Height --      Head Circumference --      Peak Flow --      Pain Score 06/05/16 1012 9     Pain Loc --      Pain Edu? --      Excl. in GC? --      Constitutional: Alert and oriented. Well appearing and in no distress. HEENT   Head: Normocephalic and atraumatic.      Eyes: Conjunctivae are normal. PERRL. Normal extraocular movements.      Ears:         Nose: No congestion/rhinnorhea.   Mouth/Throat: Mucous membranes are moist.   Neck: No stridor.  No midline cervical spine tenderness. Very tense and tender muscle spasm of the left trapezius up to the base of the skull at the occiput, and down into the top of the shoulder Cardiovascular/Chest: Normal rate, regular rhythm.  No murmurs, rubs, or gallops. Respiratory: Normal respiratory effort without tachypnea nor retractions. Breath sounds are clear and equal bilaterally. No wheezes/rales/rhonchi. Gastrointestinal: Soft. No distention, no guarding, no rebound. Nontender.    Genitourinary/rectal:Deferred Musculoskeletal: Nontender with normal range of motion in all extremities. No joint effusions.  No lower extremity tenderness.  No edema. Neurologic:  Normal speech and language. No gross or focal neurologic deficits are appreciated. Skin:  Skin is warm, dry and intact. No rash  noted. Psychiatric: Mood and affect are normal. Speech and behavior are normal. Patient exhibits appropriate insight and judgment.   ____________________________________________  LABS (pertinent positives/negatives)  Labs Reviewed  BASIC METABOLIC PANEL - Abnormal; Notable for the following:       Result Value   Potassium 5.3 (*)    Chloride 99 (*)    Glucose, Bld 307 (*)    BUN 28 (*)    All other components within normal limits  CBC - Abnormal; Notable for the following:    WBC 12.3 (*)    All other components within normal limits  URINALYSIS, COMPLETE (UACMP) WITH MICROSCOPIC  CBG MONITORING, ED    ____________________________________________    EKG I, Governor Rooks, MD,  the attending physician have personally viewed and interpreted all ECGs.  106 bpm. Sinus tachycardia. Narrow QRS. Normal axis. Nonspecific ST and T-wave ____________________________________________  RADIOLOGY All Xrays were viewed by me. Imaging interpreted by Radiologist.  None __________________________________________  PROCEDURES  Procedure(s) performed: None  Critical Care performed: None  ____________________________________________   ED COURSE / ASSESSMENT AND PLAN  Pertinent labs & imaging results that were available during my care of the patient were reviewed by me and considered in my medical decision making (see chart for details).   Ms. Frazier RichardsShepherd came in concerned about muscle spasm, and on exam she certainly does have a muscle spasm of the left trapezius muscle. Denies neurologic symptoms by history, no neurologic abnormality on exam. Neurovascularly intact on exam.  Slight tachycardia I suspect is due to acute pain.  She's not having cardiac or respiratory symptoms. At this point I think she is clinically consistent with muscle spasm, I am not recommending additional imaging at this point in time, I don't have a high suspicion of more serious cause of the left-sided neck pain at  the site where she has tense and tender muscle spasm.  2pm, much improved.  Will d/c home from ER.  CONSULTATIONS:  None   Patient / Family / Caregiver informed of clinical course, medical decision-making process, and agree with plan.   I discussed return precautions, follow-up instructions, and discharge instructions with patient and/or family.   ___________________________________________   FINAL CLINICAL IMPRESSION(S) / ED DIAGNOSES   Final diagnoses:  Trapezius muscle spasm              Note: This dictation was prepared with Dragon dictation. Any transcriptional errors that result from this process are unintentional    Governor Rooksebecca Khadim Lundberg, MD 06/05/16 1409

## 2016-06-05 NOTE — ED Triage Notes (Signed)
Pt to ed with c/o neck pain and back of head pain.  Pt also reports headache and dizziness.

## 2016-06-05 NOTE — ED Notes (Signed)
Pt c/o left side neck pain radiating down shoulder xfew days, denies any CP or SOB. Pt A&Ox4, in NAD at this time. Pt denies any fevers , one episode of vomiting last night.

## 2016-10-18 ENCOUNTER — Ambulatory Visit: Admission: RE | Admit: 2016-10-18 | Payer: Medicare Other | Source: Ambulatory Visit | Admitting: Gastroenterology

## 2016-10-18 ENCOUNTER — Encounter: Admission: RE | Payer: Self-pay | Source: Ambulatory Visit

## 2016-10-18 SURGERY — COLONOSCOPY WITH PROPOFOL
Anesthesia: General

## 2016-12-18 ENCOUNTER — Other Ambulatory Visit: Payer: Self-pay | Admitting: Cardiology

## 2016-12-18 DIAGNOSIS — R0789 Other chest pain: Secondary | ICD-10-CM

## 2017-01-03 ENCOUNTER — Inpatient Hospital Stay: Payer: Medicare HMO | Admitting: Internal Medicine

## 2017-01-03 NOTE — Progress Notes (Deleted)
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 I reviewed the patient's prior charts/pertinent labs/imaging in detail; findings are summarized above.    61 year old female patient with multiple medical problems;  Unfortunately continues to smoke  And a history of left lower extremity DVT 2014 is here for follow-up/ reestablish care after multiple missed appointments. Patient is on Coumadin;  Unfortunately no frequent INR checks.   Patient complains of worsening pain in the  Left lower extremity/ swelling.  Has poor appetite. Lost some weight.  Also  Chronic shortness of breath chronic cough, hemoptysis.  REVIEW OF SYSTEMS:  A complete 10 point review of system is done which is negative except mentioned above/history of present illness.   PAST MEDICAL HISTORY :  Past Medical History:  Diagnosis Date  . Anxiety   . Colon polyps   . COPD (chronic obstructive pulmonary disease) (HCC)   . Diabetes mellitus without complication (HCC)   . DVT (deep venous thrombosis) (HCC)   . DVT (deep venous thrombosis) (HCC)   . History of colonic polyps   . Hyperlipidemia   . Peripheral artery disease (HCC)   . Pulmonary nodules/lesions, multiple 08/2012   NEGATIVE PET SCAN  . Retroperitoneal abscess (HCC)    history of in 04/2013  . Tobacco abuse     PAST SURGICAL HISTORY :   Past Surgical History:  Procedure Laterality Date  . CHOLECYSTECTOMY    . COLONOSCOPY  05/2007    FAMILY HISTORY :   Family History  Problem Relation Age of Onset  . Brain cancer Mother     SOCIAL HISTORY:   Social History   Substance Use Topics  . Smoking status: Current Every Day Smoker    Packs/day: 0.50    Years: 45.00    Types: Cigarettes  . Smokeless tobacco: Never Used     Comment: Smoking History 2.5-started at age 11  . Alcohol use No    ALLERGIES:  is allergic to eggs or egg-derived products.  MEDICATIONS:  Current Outpatient Prescriptions  Medication Sig Dispense Refill  . albuterol (PROVENTIL) (2.5 MG/3ML) 0.083% nebulizer solution Inhale into the lungs.    . cyclobenzaprine (FLEXERIL) 10 MG tablet Take 1 tablet (10 mg total) by mouth 3 (three) times daily as needed for muscle spasms. 21 tablet 0  . Fluticasone-Salmeterol (ADVAIR DISKUS) 250-50 MCG/DOSE AEPB Inhale 1 puff into the lungs 2 (two) times daily. 1 each 0  . glipiZIDE (GLUCOTROL) 5 MG tablet Take by mouth daily before breakfast.    . ibuprofen (ADVIL,MOTRIN) 600 MG tablet Take 1 tablet (600 mg total) by mouth every 8 (eight) hours as needed. 20 tablet 0  . insulin aspart (NOVOLOG) 100 UNIT/ML FlexPen Inject 20-60 Units into the skin 3 (three) times daily. Pt uses 20 units in the morning, 20 units at lunch, and 60 units at bedtime.    . Insulin Detemir (LEVEMIR) 100 UNIT/ML Pen Inject 10 Units into the skin at bedtime.    . metoCLOPramide (REGLAN) 10 MG tablet Take by mouth.    . nicotine (NICODERM CQ - DOSED IN MG/24 HOURS) 21 mg/24hr patch Place 21  mg onto the skin daily.    Marland Kitchen oxyCODONE-acetaminophen (PERCOCET) 10-325 MG tablet Take by mouth.    . pantoprazole (PROTONIX) 40 MG tablet Take 40 mg by mouth daily.    Marland Kitchen senna (SENOKOT) 8.6 MG tablet Take 1 tablet by mouth daily.    . simvastatin (ZOCOR) 10 MG tablet Take 10 mg by mouth daily.    Marland Kitchen warfarin (COUMADIN) 1 MG tablet Take 6 mg by mouth daily.      No current facility-administered medications for this visit.     PHYSICAL EXAMINATION:   There were no vitals taken for this visit.  There were no vitals filed for this visit.  GENERAL:  Cachectic poorly nourished female  patient Alert, no distress and comfortable.   EYES: no pallor or icterus OROPHARYNX: no thrush or ulceration; poor dentition NECK: supple, no masses felt LYMPH:  no palpable lymphadenopathy in the cervical, axillary or inguinal regions LUNGS:  Decreased breath sounds bilaterally. HEART/CVS: regular rate & rhythm and no murmurs;  Mild swelling noted of the left lower extremity ABDOMEN:abdomen soft, non-tender and normal bowel sounds Musculoskeletal:no cyanosis of digits and no clubbing  PSYCH: alert & oriented x 3 with fluent speech NEURO: no focal motor/sensory deficits SKIN:  no rashes or significant lesions  LABORATORY DATA:  I have reviewed the data as listed    Component Value Date/Time   NA 138 06/05/2016 1040   NA 142 10/14/2014 0644   K 5.3 (H) 06/05/2016 1040   K 3.6 10/14/2014 0644   CL 99 (L) 06/05/2016 1040   CL 109 10/14/2014 0644   CO2 30 06/05/2016 1040   CO2 29 10/14/2014 0644   GLUCOSE 307 (H) 06/05/2016 1040   GLUCOSE 142 (H) 10/14/2014 0644   BUN 28 (H) 06/05/2016 1040   BUN 9 10/14/2014 0644   CREATININE 0.97 06/05/2016 1040   CREATININE 0.53 10/14/2014 0644   CALCIUM 9.0 06/05/2016 1040   CALCIUM 7.7 (L) 10/14/2014 0644   PROT 7.1 06/15/2015 1122   PROT 5.4 (L) 10/14/2014 0644   ALBUMIN 3.9 06/15/2015 1122   ALBUMIN 2.7 (L) 10/14/2014 0644   AST 12 (L) 06/15/2015 1122   AST 11 (L) 10/14/2014 0644   ALT 10 (L) 06/15/2015 1122   ALT 8 (L) 10/14/2014 0644   ALKPHOS 79 06/15/2015 1122   ALKPHOS 87 10/14/2014 0644   BILITOT 0.3 06/15/2015 1122   BILITOT <0.1 (L) 10/14/2014 0644   GFRNONAA >60 06/05/2016 1040   GFRNONAA >60 10/14/2014 0644   GFRAA >60 06/05/2016 1040   GFRAA >60 10/14/2014 0644    No results found for: SPEP, UPEP  Lab Results  Component Value Date   WBC 12.3 (H) 06/05/2016   NEUTROABS 3.9 06/15/2015   HGB 14.0 06/05/2016   HCT 40.6 06/05/2016   MCV 95.0 06/05/2016   PLT 243 06/05/2016      Chemistry      Component Value  Date/Time   NA 138 06/05/2016 1040   NA 142 10/14/2014 0644   K 5.3 (H) 06/05/2016 1040   K 3.6 10/14/2014 0644   CL 99 (L) 06/05/2016 1040   CL 109 10/14/2014 0644   CO2 30 06/05/2016 1040   CO2 29 10/14/2014 0644   BUN 28 (H) 06/05/2016 1040   BUN 9 10/14/2014 0644   CREATININE 0.97 06/05/2016 1040   CREATININE 0.53 10/14/2014 0644      Component Value Date/Time   CALCIUM 9.0 06/05/2016 1040   CALCIUM 7.7 (L) 10/14/2014 1610  ALKPHOS 79 06/15/2015 1122   ALKPHOS 87 10/14/2014 0644   AST 12 (L) 06/15/2015 1122   AST 11 (L) 10/14/2014 0644   ALT 10 (L) 06/15/2015 1122   ALT 8 (L) 10/14/2014 0644   BILITOT 0.3 06/15/2015 1122   BILITOT <0.1 (L) 10/14/2014 [id number]       RADIOGRAPHIC STUDIES: I have personally reviewed the radiological images as listed and agreed with the findings in the report. No results found.   ASSESSMENT & PLAN:   #  Left lower extremity DVT 2014;  this seems to be provoked after abdominal surgery.  Patient has been on Coumadin since then. Patient has been noncompliant/  Unfortunately has social issues With rides/ getting her PT/INR checked frequently.  Check PT/INR today.  Also get an ultrasound of the left lower extremity as the swelling is getting worse/ As per the patient.  Also long discussion the patient-  Regarding the duration of anticoagulation.  Given the significant concerns  With frequent blood checks/ follow-ups  The fact that DVT was provoked-  I would be inclined towards stopping Coumadin it is no acute clot noted  On the repeat ultrasound.  #  Elevated CEA-  Question from smoking.  CEA from today is pending. Prior colonoscopy- neg.   #  Patient follow-up with me in approximately  One week/  With the ultrasound of the leg.   # recommend to quit smoking.   # 25 minutes face-to-face with the patient discussing the above plan of care; more than 50% of time spent on prognosis/ natural history; counseling and coordination.     Earna CoderGovinda R  Juliani Laduke, MD 01/03/2017 8:33 AM

## 2017-01-16 ENCOUNTER — Encounter: Payer: Self-pay | Admitting: Internal Medicine

## 2017-01-16 ENCOUNTER — Inpatient Hospital Stay: Payer: Medicare HMO | Attending: Internal Medicine | Admitting: Internal Medicine

## 2017-01-16 DIAGNOSIS — I82502 Chronic embolism and thrombosis of unspecified deep veins of left lower extremity: Secondary | ICD-10-CM

## 2017-01-16 DIAGNOSIS — Z9049 Acquired absence of other specified parts of digestive tract: Secondary | ICD-10-CM | POA: Diagnosis not present

## 2017-01-16 DIAGNOSIS — Z86010 Personal history of colonic polyps: Secondary | ICD-10-CM | POA: Diagnosis not present

## 2017-01-16 DIAGNOSIS — Z79899 Other long term (current) drug therapy: Secondary | ICD-10-CM | POA: Insufficient documentation

## 2017-01-16 DIAGNOSIS — J449 Chronic obstructive pulmonary disease, unspecified: Secondary | ICD-10-CM | POA: Diagnosis not present

## 2017-01-16 DIAGNOSIS — F17210 Nicotine dependence, cigarettes, uncomplicated: Secondary | ICD-10-CM | POA: Insufficient documentation

## 2017-01-16 DIAGNOSIS — Z809 Family history of malignant neoplasm, unspecified: Secondary | ICD-10-CM | POA: Diagnosis not present

## 2017-01-16 DIAGNOSIS — E119 Type 2 diabetes mellitus without complications: Secondary | ICD-10-CM | POA: Diagnosis not present

## 2017-01-16 DIAGNOSIS — R0602 Shortness of breath: Secondary | ICD-10-CM | POA: Insufficient documentation

## 2017-01-16 DIAGNOSIS — M7989 Other specified soft tissue disorders: Secondary | ICD-10-CM

## 2017-01-16 DIAGNOSIS — Z794 Long term (current) use of insulin: Secondary | ICD-10-CM | POA: Diagnosis not present

## 2017-01-16 DIAGNOSIS — E785 Hyperlipidemia, unspecified: Secondary | ICD-10-CM | POA: Diagnosis not present

## 2017-01-16 DIAGNOSIS — Z7901 Long term (current) use of anticoagulants: Secondary | ICD-10-CM | POA: Diagnosis not present

## 2017-01-16 DIAGNOSIS — IMO0001 Reserved for inherently not codable concepts without codable children: Secondary | ICD-10-CM

## 2017-01-16 DIAGNOSIS — R918 Other nonspecific abnormal finding of lung field: Secondary | ICD-10-CM | POA: Diagnosis not present

## 2017-01-16 DIAGNOSIS — F419 Anxiety disorder, unspecified: Secondary | ICD-10-CM | POA: Diagnosis not present

## 2017-01-16 DIAGNOSIS — R630 Anorexia: Secondary | ICD-10-CM | POA: Diagnosis not present

## 2017-01-16 DIAGNOSIS — Z86718 Personal history of other venous thrombosis and embolism: Secondary | ICD-10-CM | POA: Insufficient documentation

## 2017-01-16 DIAGNOSIS — R05 Cough: Secondary | ICD-10-CM | POA: Diagnosis not present

## 2017-01-16 DIAGNOSIS — I2510 Atherosclerotic heart disease of native coronary artery without angina pectoris: Secondary | ICD-10-CM | POA: Diagnosis not present

## 2017-01-16 DIAGNOSIS — R634 Abnormal weight loss: Secondary | ICD-10-CM | POA: Insufficient documentation

## 2017-01-16 NOTE — Progress Notes (Signed)
Patient last too percocet 10/325 1 tablet at bedtime for leg pain. Patient states, "I feel like it's pins and needles sticking in my legs and sometimes it feels like a ton a bricks and heaviness in my legs."  Patient has not had a doppler in man years. states that she is smoking 1.5 packs per day.

## 2017-01-16 NOTE — Progress Notes (Signed)
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  Patient continues to be on Coumadin; this is being followed by her PCP.  Patient complains of worsening pain in the  Left lower extremity/ swelling.  Has poor appetite. Lost some weight.  Also  Chronic shortness of breath chronic cough,but NO hemoptysis.  REVIEW OF SYSTEMS:  A complete 10 point review of system is done which is negative except mentioned above/history of present illness.   PAST MEDICAL HISTORY :  Past Medical History:  Diagnosis Date  . Anxiety   . Colon polyps   . COPD (chronic obstructive pulmonary disease) (HCC)   . Diabetes mellitus without complication (HCC)   . DVT (deep venous thrombosis) (HCC)   . DVT (deep venous thrombosis) (HCC)   . History of colonic polyps   . Hyperlipidemia   . Peripheral artery disease (HCC)   . Pulmonary nodules/lesions, multiple 08/2012   NEGATIVE PET SCAN  . Retroperitoneal abscess (HCC)    history of in 04/2013  . Tobacco abuse     PAST SURGICAL HISTORY :   Past Surgical History:  Procedure Laterality Date  . CHOLECYSTECTOMY    . COLONOSCOPY  05/2007    FAMILY HISTORY :   Family History  Problem Relation Age of Onset  . Brain cancer Mother     SOCIAL HISTORY:   Social History  Substance Use Topics  . Smoking status: Current Every Day Smoker    Packs/day: 1.50    Years: 47.00    Types: Cigarettes  . Smokeless tobacco: Never Used     Comment: Smoking  History 2.5-started at age 35  . Alcohol use No    ALLERGIES:  is allergic to eggs or egg-derived products.  MEDICATIONS:  Current Outpatient Prescriptions  Medication Sig Dispense Refill  . albuterol (PROVENTIL) (2.5 MG/3ML) 0.083% nebulizer solution Inhale into the lungs.    . Fluticasone-Salmeterol (ADVAIR DISKUS) 250-50 MCG/DOSE AEPB Inhale 1 puff into the lungs 2 (two) times daily. 1 each 0  . insulin aspart (NOVOLOG) 100 UNIT/ML FlexPen Inject 20-60 Units into the skin 3 (three) times daily. Pt uses 20 units in the morning, 20 units at lunch, and 70 units at bedtime.    Marland Kitchen oxyCODONE-acetaminophen (PERCOCET) 10-325 MG tablet Take by mouth.    . pantoprazole (PROTONIX) 40 MG tablet Take 40 mg by mouth daily.    Marland Kitchen warfarin (COUMADIN) 10 MG tablet Take 10 mg by mouth daily.    Marland Kitchen ibuprofen (ADVIL,MOTRIN) 600 MG tablet Take 1 tablet (600 mg total) by mouth every 8 (eight) hours as needed. (Patient not taking: Reported on 01/16/2017) 20 tablet 0  . Insulin Detemir (LEVEMIR) 100 UNIT/ML Pen Inject 10 Units into the skin at bedtime.     No current facility-administered medications for this visit.     PHYSICAL EXAMINATION:   BP 123/80 (BP Location: Left Arm, Patient Position: Sitting)   Pulse (!) 114   Temp 97.6 F (36.4 C) (Tympanic)   Resp 16   Ht 5\' 2"  (1.575 m)  Wt 92 lb 12.8 oz (42.1 kg)   BMI 16.97 kg/m   Filed Weights   01/16/17 1501  Weight: 92 lb 12.8 oz (42.1 kg)    GENERAL:  Cachectic poorly nourished female patient Alert, no distress and comfortable.   EYES: no pallor or icterus OROPHARYNX: no thrush or ulceration; poor dentition NECK: supple, no masses felt LYMPH:  no palpable lymphadenopathy in the cervical, axillary or inguinal regions LUNGS:  Decreased breath sounds bilaterally. HEART/CVS: regular rate & rhythm and no murmurs;  Mild swelling noted of the left lower extremity ABDOMEN:abdomen soft, non-tender and normal bowel sounds Musculoskeletal:no cyanosis  of digits and no clubbing  PSYCH: alert & oriented x 3 with fluent speech NEURO: no focal motor/sensory deficits SKIN:  no rashes or significant lesions  LABORATORY DATA:  I have reviewed the data as listed    Component Value Date/Time   NA 138 06/05/2016 1040   NA 142 10/14/2014 0644   K 5.3 (H) 06/05/2016 1040   K 3.6 10/14/2014 0644   CL 99 (L) 06/05/2016 1040   CL 109 10/14/2014 0644   CO2 30 06/05/2016 1040   CO2 29 10/14/2014 0644   GLUCOSE 307 (H) 06/05/2016 1040   GLUCOSE 142 (H) 10/14/2014 0644   BUN 28 (H) 06/05/2016 1040   BUN 9 10/14/2014 0644   CREATININE 0.97 06/05/2016 1040   CREATININE 0.53 10/14/2014 0644   CALCIUM 9.0 06/05/2016 1040   CALCIUM 7.7 (L) 10/14/2014 0644   PROT 7.1 06/15/2015 1122   PROT 5.4 (L) 10/14/2014 0644   ALBUMIN 3.9 06/15/2015 1122   ALBUMIN 2.7 (L) 10/14/2014 0644   AST 12 (L) 06/15/2015 1122   AST 11 (L) 10/14/2014 0644   ALT 10 (L) 06/15/2015 1122   ALT 8 (L) 10/14/2014 0644   ALKPHOS 79 06/15/2015 1122   ALKPHOS 87 10/14/2014 0644   BILITOT 0.3 06/15/2015 1122   BILITOT <0.1 (L) 10/14/2014 0644   GFRNONAA >60 06/05/2016 1040   GFRNONAA >60 10/14/2014 0644   GFRAA >60 06/05/2016 1040   GFRAA >60 10/14/2014 0644    No results found for: SPEP, UPEP  Lab Results  Component Value Date   WBC 12.3 (H) 06/05/2016   NEUTROABS 3.9 06/15/2015   HGB 14.0 06/05/2016   HCT 40.6 06/05/2016   MCV 95.0 06/05/2016   PLT 243 06/05/2016      Chemistry      Component Value Date/Time   NA 138 06/05/2016 1040   NA 142 10/14/2014 0644   K 5.3 (H) 06/05/2016 1040   K 3.6 10/14/2014 0644   CL 99 (L) 06/05/2016 1040   CL 109 10/14/2014 0644   CO2 30 06/05/2016 1040   CO2 29 10/14/2014 0644   BUN 28 (H) 06/05/2016 1040   BUN 9 10/14/2014 0644   CREATININE 0.97 06/05/2016 1040   CREATININE 0.53 10/14/2014 0644      Component Value Date/Time   CALCIUM 9.0 06/05/2016 1040   CALCIUM 7.7 (L) 10/14/2014 0644   ALKPHOS 79 06/15/2015  1122   ALKPHOS 87 10/14/2014 0644   AST 12 (L) 06/15/2015 1122   AST 11 (L) 10/14/2014 0644   ALT 10 (L) 06/15/2015 1122   ALT 8 (L) 10/14/2014 0644   BILITOT 0.3 06/15/2015 1122   BILITOT <0.1 (L) 10/14/2014 4098       RADIOGRAPHIC STUDIES: I have personally reviewed the radiological images as listed and agreed with the findings in the report. No results found.   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  Patient has been on Coumadin since then.   # Given the worsening pain in the left lower extremity- repeat an ultrasound of the leg. If no acute clot; I would recommend discontinuation of Coumadin.  # recommend to quit smoking. Patient seems to not interested.  # follow up in 2 weeks; US leg.   Cc; Dr.Wroth; Phineas Realharles [name].   April [name].     April Bihm R Leathia Farnell, MD 01/16/2017 5:14 PM

## 2017-01-16 NOTE — Assessment & Plan Note (Addendum)
#    Left lower extremity DVT 2014;  this seems to be provoked after abdominal surgery.  Patient has been on Coumadin since then.   # Given the worsening pain in the left lower extremity- repeat an ultrasound of the leg. If no acute clot; I would recommend discontinuation of Coumadin.  # recommend to quit smoking. Patient seems to not interested.  # follow up in 2 weeks; US leg.   Cc; Dr.Wroth; Charles Drew.  

## 2017-01-30 ENCOUNTER — Ambulatory Visit
Admission: RE | Admit: 2017-01-30 | Discharge: 2017-01-30 | Disposition: A | Discharge status: Home / Self Care | Payer: Medicare HMO | Source: Ambulatory Visit | Attending: Internal Medicine | Admitting: Internal Medicine

## 2017-01-30 DIAGNOSIS — I82512 Chronic embolism and thrombosis of left femoral vein: Secondary | ICD-10-CM | POA: Diagnosis not present

## 2017-01-30 DIAGNOSIS — I82891 Chronic embolism and thrombosis of other specified veins: Secondary | ICD-10-CM | POA: Insufficient documentation

## 2017-01-30 DIAGNOSIS — I82502 Chronic embolism and thrombosis of unspecified deep veins of left lower extremity: Secondary | ICD-10-CM

## 2017-01-30 DIAGNOSIS — IMO0001 Reserved for inherently not codable concepts without codable children: Secondary | ICD-10-CM

## 2017-02-01 ENCOUNTER — Inpatient Hospital Stay: Payer: Medicare HMO | Admitting: Internal Medicine

## 2017-02-01 NOTE — Progress Notes (Deleted)
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  Patient continues to be on Coumadin; this is being followed by her PCP.  Patient complains of worsening pain in the  Left lower extremity/ swelling.  Has poor appetite. Lost some weight.  Also  Chronic shortness of breath chronic cough,but NO hemoptysis.  REVIEW OF SYSTEMS:  A complete 10 point review of system is done which is negative except mentioned above/history of present illness.   PAST MEDICAL HISTORY :  Past Medical History:  Diagnosis Date  . Anxiety   . Colon polyps   . COPD (chronic obstructive pulmonary disease) (HCC)   . Diabetes mellitus without complication (HCC)   . DVT (deep venous thrombosis) (HCC)   . DVT (deep venous thrombosis) (HCC)   . History of colonic polyps   . Hyperlipidemia   . Peripheral artery disease (HCC)   . Pulmonary nodules/lesions, multiple 08/2012   NEGATIVE PET SCAN  . Retroperitoneal abscess (HCC)    history of in 04/2013  . Tobacco abuse     PAST SURGICAL HISTORY :   Past Surgical History:  Procedure Laterality Date  . CHOLECYSTECTOMY    . COLONOSCOPY  05/2007    FAMILY HISTORY :   Family History  Problem Relation Age of Onset  . Brain cancer Mother     SOCIAL HISTORY:   Social History  Substance Use Topics  . Smoking status: Current Every Day Smoker    Packs/day: 1.50    Years: 47.00    Types: Cigarettes  . Smokeless tobacco: Never Used     Comment: Smoking  History 2.5-started at age 61  . Alcohol use No    ALLERGIES:  is allergic to eggs or egg-derived products.  MEDICATIONS:  Current Outpatient Prescriptions  Medication Sig Dispense Refill  . albuterol (PROVENTIL) (2.5 MG/3ML) 0.083% nebulizer solution Inhale into the lungs.    . Fluticasone-Salmeterol (ADVAIR DISKUS) 250-50 MCG/DOSE AEPB Inhale 1 puff into the lungs 2 (two) times daily. 1 each 0  . ibuprofen (ADVIL,MOTRIN) 600 MG tablet Take 1 tablet (600 mg total) by mouth every 8 (eight) hours as needed. (Patient not taking: Reported on 01/16/2017) 20 tablet 0  . insulin aspart (NOVOLOG) 100 UNIT/ML FlexPen Inject 20-60 Units into the skin 3 (three) times daily. Pt uses 20 units in the morning, 20 units at lunch, and 70 units at bedtime.    . Insulin Detemir (LEVEMIR) 100 UNIT/ML Pen Inject 10 Units into the skin at bedtime.    Marland Kitchen. oxyCODONE-acetaminophen (PERCOCET) 10-325 MG tablet Take by mouth.    . pantoprazole (PROTONIX) 40 MG tablet Take 40 mg by mouth daily.    Marland Kitchen. warfarin (COUMADIN) 10 MG tablet Take 10 mg by mouth daily.     No current facility-administered medications for this visit.     PHYSICAL EXAMINATION:   There were no vitals taken for this visit.  There were no vitals filed for this visit.  GENERAL:  Cachectic poorly nourished female patient Alert, no distress and comfortable.   EYES:  no pallor or icterus OROPHARYNX: no thrush or ulceration; poor dentition NECK: supple, no masses felt LYMPH:  no palpable lymphadenopathy in the cervical, axillary or inguinal regions LUNGS:  Decreased breath sounds bilaterally. HEART/CVS: regular rate & rhythm and no murmurs;  Mild swelling noted of the left lower extremity ABDOMEN:abdomen soft, non-tender and normal bowel sounds Musculoskeletal:no cyanosis of digits and no clubbing  PSYCH: alert & oriented x 3 with fluent speech NEURO: no focal motor/sensory deficits SKIN:  no rashes or significant lesions  LABORATORY DATA:  I  have reviewed the data as listed    Component Value Date/Time   NA 138 06/05/2016 1040   NA 142 10/14/2014 0644   K 5.3 (H) 06/05/2016 1040   K 3.6 10/14/2014 0644   CL 99 (L) 06/05/2016 1040   CL 109 10/14/2014 0644   CO2 30 06/05/2016 1040   CO2 29 10/14/2014 0644   GLUCOSE 307 (H) 06/05/2016 1040   GLUCOSE 142 (H) 10/14/2014 0644   BUN 28 (H) 06/05/2016 1040   BUN 9 10/14/2014 0644   CREATININE 0.97 06/05/2016 1040   CREATININE 0.53 10/14/2014 0644   CALCIUM 9.0 06/05/2016 1040   CALCIUM 7.7 (L) 10/14/2014 0644   PROT 7.1 06/15/2015 1122   PROT 5.4 (L) 10/14/2014 0644   ALBUMIN 3.9 06/15/2015 1122   ALBUMIN 2.7 (L) 10/14/2014 0644   AST 12 (L) 06/15/2015 1122   AST 11 (L) 10/14/2014 0644   ALT 10 (L) 06/15/2015 1122   ALT 8 (L) 10/14/2014 0644   ALKPHOS 79 06/15/2015 1122   ALKPHOS 87 10/14/2014 0644   BILITOT 0.3 06/15/2015 1122   BILITOT <0.1 (L) 10/14/2014 0644   GFRNONAA >60 06/05/2016 1040   GFRNONAA >60 10/14/2014 0644   GFRAA >60 06/05/2016 1040   GFRAA >60 10/14/2014 0644    No results found for: SPEP, UPEP  Lab Results  Component Value Date   WBC 12.3 (H) 06/05/2016   NEUTROABS 3.9 06/15/2015   HGB 14.0 06/05/2016   HCT 40.6 06/05/2016   MCV 95.0 06/05/2016   PLT 243 06/05/2016      Chemistry      Component Value Date/Time   NA 138 06/05/2016 1040   NA 142 10/14/2014 0644   K 5.3 (H) 06/05/2016 1040   K 3.6 10/14/2014 0644   CL 99 (L) 06/05/2016 1040   CL 109 10/14/2014 0644   CO2 30 06/05/2016 1040   CO2 29 10/14/2014 0644   BUN 28 (H) 06/05/2016 1040   BUN 9 10/14/2014 0644   CREATININE 0.97 06/05/2016 1040   CREATININE 0.53 10/14/2014 0644      Component Value Date/Time   CALCIUM 9.0 06/05/2016 1040   CALCIUM 7.7 (L) 10/14/2014 0644   ALKPHOS 79 06/15/2015 1122   ALKPHOS 87 10/14/2014 0644   AST 12 (L) 06/15/2015 1122   AST 11 (L) 10/14/2014 0644   ALT 10 (L) 06/15/2015 1122   ALT 8 (L) 10/14/2014 0644   BILITOT 0.3 06/15/2015  1122   BILITOT <0.1 (L) 10/14/2014 1610       RADIOGRAPHIC STUDIES: I have personally reviewed the radiological images as listed and agreed with the findings in the report. US Venous Img Lower Unilateral Left  Result Date: 01/30/2017 CLINICAL DATA:  Left leg swelling. Chronic, recurrent DVT of the left leg. EXAM: 10/14/2014. LOWER EXTREMITY VENOUS DOPPLER ULTRASOUND TECHNIQUE: Gray-scale sonography with graded compression, as well as color Doppler and duplex ultrasound were performed to evaluate the lower extremity deep venous systems  from the level of the common femoral vein and including the common femoral, femoral, profunda femoral, popliteal and calf veins including the posterior tibial, peroneal and gastrocnemius veins when visible. The superficial great saphenous vein was also interrogated. Spectral Doppler was utilized to evaluate flow at rest and with distal augmentation maneuvers in the common femoral, femoral and popliteal veins. COMPARISON:  None. FINDINGS: Contralateral Common Femoral Vein: Respiratory phasicity is normal and symmetric with the symptomatic side. No evidence of thrombus. Normal compressibility. Common Femoral Vein: Mild, peripheral nonocclusive thrombus without significant change. Saphenofemoral Junction: Mild, peripheral nonocclusive thrombus with improvement. Profunda Femoral Vein: No evidence of thrombus. Normal compressibility and flow on color Doppler imaging. Femoral Vein: No evidence of thrombus. Normal compressibility, respiratory phasicity and response to augmentation. Popliteal Vein: No evidence of thrombus. Normal compressibility, respiratory phasicity and response to augmentation. Calf Veins: Mild, peripheral nonocclusive thrombus in the peroneal veins, not previously seen. Superficial Great Saphenous Vein: No evidence of thrombus. Normal compressibility and flow on color Doppler imaging. Possible reversal of flow. Venous Reflux:  Possibly in the superficial great  saphenous vein. Other Findings:  None. IMPRESSION: 1. No significant change in chronic, peripheral, nonocclusive thrombus in the left common femoral vein and saphenous femoral junction. 2. Interval mild chronic, peripheral, nonocclusive thrombus in the left peroneal veins. 3. No acute deep venous thrombosis seen. 4. Possible reversal flow within the superficial great saphenous vein on the left. . Electronically Signed   By: Beckie SaltsSteven  Reid M.D.   On: 01/30/2017 16:32     ASSESSMENT & PLAN:   No problem-specific Assessment & Plan notes found for this encounter.  Earna Coder.     Govinda R Brahmanday, MD 02/01/2017 8:18 AM

## 2017-02-01 NOTE — Assessment & Plan Note (Deleted)
#    Left lower extremity DVT 2014;  this seems to be provoked after abdominal surgery.  Patient has been on Coumadin since then.   # Given the worsening pain in the left lower extremity- repeat an ultrasound of the leg. If no acute clot; I would recommend discontinuation of Coumadin.  # recommend to quit smoking. Patient seems to not interested.  # follow up in 2 weeks; US leg.   Cc; Dr.Wroth; Phineas Realharles Drew.

## 2017-02-04 ENCOUNTER — Ambulatory Visit: Payer: Medicare HMO

## 2017-02-04 ENCOUNTER — Other Ambulatory Visit: Payer: Medicare HMO

## 2017-02-06 ENCOUNTER — Telehealth: Payer: Self-pay | Admitting: *Deleted

## 2017-02-06 NOTE — Telephone Encounter (Signed)
-----   Message from Collene LeydenAngela B Fuller sent at 02/04/2017  1:49 PM EDT ----- Regarding: Call Pt Please call pt about appt scheduling.

## 2017-02-06 NOTE — Telephone Encounter (Signed)
Call attempted made. Phone just rings. Unable to leave vm.

## 2017-02-11 ENCOUNTER — Inpatient Hospital Stay: Payer: Medicare HMO | Admitting: Internal Medicine

## 2017-02-12 ENCOUNTER — Telehealth: Payer: Self-pay | Admitting: *Deleted

## 2017-02-12 ENCOUNTER — Ambulatory Visit: Payer: Medicare HMO

## 2017-02-12 NOTE — Telephone Encounter (Signed)
Asking for results of Doppler. Patient informed of resultrs and of note on results that she is being [facility] and Dr Butler DenmarkWroth is to order her COumadin

## 2017-02-13 ENCOUNTER — Encounter: Payer: Self-pay | Admitting: *Deleted

## 2017-02-19 ENCOUNTER — Ambulatory Visit: Payer: Medicare HMO

## 2017-02-26 ENCOUNTER — Ambulatory Visit: Payer: Medicare HMO

## 2017-05-17 ENCOUNTER — Emergency Department
Admission: EM | Admit: 2017-05-17 | Discharge: 2017-05-17 | Disposition: A | Discharge status: Home / Self Care | Payer: Medicare HMO | Attending: Emergency Medicine | Admitting: Emergency Medicine

## 2017-05-17 ENCOUNTER — Emergency Department: Payer: Medicare HMO

## 2017-05-17 DIAGNOSIS — Z5321 Procedure and treatment not carried out due to patient leaving prior to being seen by health care provider: Secondary | ICD-10-CM | POA: Insufficient documentation

## 2017-05-17 DIAGNOSIS — M79671 Pain in right foot: Secondary | ICD-10-CM | POA: Insufficient documentation

## 2017-05-17 LAB — CBC WITH DIFFERENTIAL/PLATELET
BASOS ABS: 0 10*3/uL (ref 0–0.1)
BASOS PCT: 0 %
Eosinophils Absolute: 0.3 10*3/uL (ref 0–0.7)
Eosinophils Relative: 3 %
HCT: 39.8 % (ref 35.0–47.0)
Hemoglobin: 13.8 g/dL (ref 12.0–16.0)
LYMPHS PCT: 27 %
Lymphs Abs: 2.2 10*3/uL (ref 1.0–3.6)
MCH: 33.1 pg (ref 26.0–34.0)
MCHC: 34.7 g/dL (ref 32.0–36.0)
MCV: 95.5 fL (ref 80.0–100.0)
Monocytes Absolute: 0.8 10*3/uL (ref 0.2–0.9)
Monocytes Relative: 10 %
NEUTROS ABS: 4.8 10*3/uL (ref 1.4–6.5)
NEUTROS PCT: 60 %
PLATELETS: 200 10*3/uL (ref 150–440)
RBC: 4.16 MIL/uL (ref 3.80–5.20)
RDW: 13.1 % (ref 11.5–14.5)
WBC: 8 10*3/uL (ref 3.6–11.0)

## 2017-05-17 LAB — COMPREHENSIVE METABOLIC PANEL
ALBUMIN: 3.3 g/dL — AB (ref 3.5–5.0)
ALK PHOS: 104 U/L (ref 38–126)
ALT: 9 U/L — ABNORMAL LOW (ref 14–54)
ANION GAP: 10 (ref 5–15)
AST: 13 U/L — ABNORMAL LOW (ref 15–41)
BILIRUBIN TOTAL: 0.7 mg/dL (ref 0.3–1.2)
BUN: 18 mg/dL (ref 6–20)
CALCIUM: 8.5 mg/dL — AB (ref 8.9–10.3)
CO2: 27 mmol/L (ref 22–32)
Chloride: 101 mmol/L (ref 101–111)
Creatinine, Ser: 0.86 mg/dL (ref 0.44–1.00)
GFR calc non Af Amer: >60 mL/min (ref >60)
Glucose, Bld: 417 mg/dL — ABNORMAL HIGH (ref 65–99)
Potassium: 4.7 mmol/L (ref 3.5–5.1)
Sodium: 138 mmol/L (ref 135–145)
TOTAL PROTEIN: 6.7 g/dL (ref 6.5–8.1)

## 2017-05-17 LAB — GLUCOSE, CAPILLARY: Glucose-Capillary: 353 mg/dL — ABNORMAL HIGH (ref 65–99)

## 2017-05-17 NOTE — ED Triage Notes (Signed)
Patient c/o right foot pain. Patient has wound to 5th digit right foot. Patient has redness and swelling to foot. Patient is a diabetic.

## 2017-05-17 NOTE — ED Notes (Signed)
Pt called several times no evidence of pt (in wheelchair) or family in waiting room or any of the parking lots.

## 2018-04-08 ENCOUNTER — Emergency Department: Payer: Medicare HMO

## 2018-04-08 ENCOUNTER — Other Ambulatory Visit: Payer: Self-pay

## 2018-04-08 ENCOUNTER — Encounter: Payer: Self-pay | Admitting: Emergency Medicine

## 2018-04-08 ENCOUNTER — Inpatient Hospital Stay
Admission: EM | Admit: 2018-04-08 | Discharge: 2018-04-11 | DRG: 637 | Disposition: A | Discharge status: Home / Self Care | Payer: Medicare HMO | Attending: Internal Medicine | Admitting: Internal Medicine

## 2018-04-08 DIAGNOSIS — E11621 Type 2 diabetes mellitus with foot ulcer: Principal | ICD-10-CM | POA: Diagnosis present

## 2018-04-08 DIAGNOSIS — E119 Type 2 diabetes mellitus without complications: Secondary | ICD-10-CM

## 2018-04-08 DIAGNOSIS — Z66 Do not resuscitate: Secondary | ICD-10-CM | POA: Diagnosis present

## 2018-04-08 DIAGNOSIS — L03115 Cellulitis of right lower limb: Secondary | ICD-10-CM | POA: Diagnosis present

## 2018-04-08 DIAGNOSIS — IMO0001 Reserved for inherently not codable concepts without codable children: Secondary | ICD-10-CM | POA: Diagnosis present

## 2018-04-08 DIAGNOSIS — Z86718 Personal history of other venous thrombosis and embolism: Secondary | ICD-10-CM

## 2018-04-08 DIAGNOSIS — I82502 Chronic embolism and thrombosis of unspecified deep veins of left lower extremity: Secondary | ICD-10-CM

## 2018-04-08 DIAGNOSIS — L97519 Non-pressure chronic ulcer of other part of right foot with unspecified severity: Secondary | ICD-10-CM | POA: Diagnosis present

## 2018-04-08 DIAGNOSIS — F17210 Nicotine dependence, cigarettes, uncomplicated: Secondary | ICD-10-CM | POA: Diagnosis present

## 2018-04-08 DIAGNOSIS — E1165 Type 2 diabetes mellitus with hyperglycemia: Secondary | ICD-10-CM | POA: Diagnosis present

## 2018-04-08 DIAGNOSIS — Z7982 Long term (current) use of aspirin: Secondary | ICD-10-CM | POA: Diagnosis not present

## 2018-04-08 DIAGNOSIS — J449 Chronic obstructive pulmonary disease, unspecified: Secondary | ICD-10-CM | POA: Diagnosis present

## 2018-04-08 DIAGNOSIS — E43 Unspecified severe protein-calorie malnutrition: Secondary | ICD-10-CM | POA: Diagnosis present

## 2018-04-08 DIAGNOSIS — Z79899 Other long term (current) drug therapy: Secondary | ICD-10-CM | POA: Diagnosis not present

## 2018-04-08 DIAGNOSIS — R739 Hyperglycemia, unspecified: Secondary | ICD-10-CM

## 2018-04-08 DIAGNOSIS — E1151 Type 2 diabetes mellitus with diabetic peripheral angiopathy without gangrene: Secondary | ICD-10-CM | POA: Diagnosis present

## 2018-04-08 DIAGNOSIS — L97509 Non-pressure chronic ulcer of other part of unspecified foot with unspecified severity: Secondary | ICD-10-CM

## 2018-04-08 DIAGNOSIS — Z7951 Long term (current) use of inhaled steroids: Secondary | ICD-10-CM | POA: Diagnosis not present

## 2018-04-08 DIAGNOSIS — Z7901 Long term (current) use of anticoagulants: Secondary | ICD-10-CM | POA: Diagnosis not present

## 2018-04-08 DIAGNOSIS — I96 Gangrene, not elsewhere classified: Secondary | ICD-10-CM

## 2018-04-08 DIAGNOSIS — E785 Hyperlipidemia, unspecified: Secondary | ICD-10-CM | POA: Diagnosis present

## 2018-04-08 DIAGNOSIS — Z681 Body mass index (BMI) 19 or less, adult: Secondary | ICD-10-CM | POA: Diagnosis not present

## 2018-04-08 LAB — COMPREHENSIVE METABOLIC PANEL WITH GFR
ALT: 29 U/L (ref 0–44)
AST: 17 U/L (ref 15–41)
Albumin: 3.3 g/dL — ABNORMAL LOW (ref 3.5–5.0)
Alkaline Phosphatase: 206 U/L — ABNORMAL HIGH (ref 38–126)
Anion gap: 7 (ref 5–15)
BUN: 20 mg/dL (ref 8–23)
CO2: 28 mmol/L (ref 22–32)
Calcium: 8.4 mg/dL — ABNORMAL LOW (ref 8.9–10.3)
Chloride: 102 mmol/L (ref 98–111)
Creatinine, Ser: 0.87 mg/dL (ref 0.44–1.00)
GFR calc Af Amer: >60 mL/min
GFR calc non Af Amer: >60 mL/min
Glucose, Bld: 404 mg/dL — ABNORMAL HIGH (ref 70–99)
Potassium: 4.3 mmol/L (ref 3.5–5.1)
Sodium: 137 mmol/L (ref 135–145)
Total Bilirubin: 0.4 mg/dL (ref 0.3–1.2)
Total Protein: 6.9 g/dL (ref 6.5–8.1)

## 2018-04-08 LAB — CBC WITH DIFFERENTIAL/PLATELET
ABS IMMATURE GRANULOCYTES: 0.01 10*3/uL (ref 0.00–0.07)
BASOS PCT: 0 %
Basophils Absolute: 0 10*3/uL (ref 0.0–0.1)
Eosinophils Absolute: 0.2 10*3/uL (ref 0.0–0.5)
Eosinophils Relative: 3 %
HCT: 41.3 % (ref 36.0–46.0)
HEMOGLOBIN: 13.2 g/dL (ref 12.0–15.0)
Immature Granulocytes: 0 %
LYMPHS ABS: 1.2 10*3/uL (ref 0.7–4.0)
LYMPHS PCT: 17 %
MCH: 31.1 pg (ref 26.0–34.0)
MCHC: 32 g/dL (ref 30.0–36.0)
MCV: 97.4 fL (ref 80.0–100.0)
MONO ABS: 0.5 10*3/uL (ref 0.1–1.0)
MONOS PCT: 8 %
NEUTROS ABS: 5.2 10*3/uL (ref 1.7–7.7)
Neutrophils Relative %: 72 %
Platelets: 228 10*3/uL (ref 150–400)
RBC: 4.24 MIL/uL (ref 3.87–5.11)
RDW: 14.7 % (ref 11.5–15.5)
WBC: 7.2 10*3/uL (ref 4.0–10.5)
nRBC: 0 % (ref 0.0–0.2)

## 2018-04-08 LAB — GLUCOSE, CAPILLARY: Glucose-Capillary: 374 mg/dL — ABNORMAL HIGH (ref 70–99)

## 2018-04-08 LAB — LACTIC ACID, PLASMA: LACTIC ACID, VENOUS: 2.1 mmol/L — AB (ref 0.5–1.9)

## 2018-04-08 MED ORDER — VANCOMYCIN HCL IN DEXTROSE 1-5 GM/200ML-% IV SOLN
1000.0000 mg | Freq: Once | INTRAVENOUS | Status: AC
  Administered 2018-04-09: 1000 mg via INTRAVENOUS
  Filled 2018-04-08: qty 200

## 2018-04-08 MED ORDER — PIPERACILLIN-TAZOBACTAM 3.375 G IVPB 30 MIN
3.3750 g | Freq: Once | INTRAVENOUS | Status: DC
  Administered 2018-04-08: 3.375 g via INTRAVENOUS
  Filled 2018-04-08: qty 50

## 2018-04-08 NOTE — ED Notes (Signed)
Spoke with Dr Robinson and verbal orders obtained 

## 2018-04-08 NOTE — ED Triage Notes (Signed)
Pt to triage via w/c with no distress; Pt reports swelling to right foot x 3 days; blister to great toe; hx amputation yr ago; denies any injury; has appt Friday with PCP

## 2018-04-08 NOTE — ED Provider Notes (Signed)
[facility] Emergency Department Provider Note    First MD Initiated Contact with Patient 04/08/18 2220     (approximate)  I have reviewed the triage vital signs and the nursing notes.   HISTORY  Chief Complaint Foot Pain    HPI April [name] is a 62 y.o. female with a history of COPD, anxiety, DVT, peripheral arterial disease and poorly controlled diabetes presents to the ER for worsening right foot pain redness and purulent drainage.  No recent antibiotics.  Does have a history of osteomyelitis status post fifth ray amputation roughly 2 years ago.  Has been dealing with wound to the base of the left toe but over the past several days has become significantly worse.  She does still smoke.    Past Medical History:  Diagnosis Date  . Anxiety   . Colon polyps   . COPD (chronic obstructive pulmonary disease) (HCC)   . Diabetes mellitus without complication (HCC)   . DVT (deep venous thrombosis) (HCC)   . DVT (deep venous thrombosis) (HCC)   . History of colonic polyps   . Hyperlipidemia   . Peripheral artery disease (HCC)   . Pulmonary nodules/lesions, multiple 08/2012   NEGATIVE PET SCAN  . Retroperitoneal abscess (HCC)    history of in 04/2013  . Tobacco abuse    Family History  Problem Relation Age of Onset  . Brain cancer Mother    Past Surgical History:  Procedure Laterality Date  . CHOLECYSTECTOMY    . COLONOSCOPY  05/2007   Patient Active Problem List   Diagnosis Date Noted  . 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 Elevated CEA 01/30/2014      Prior to Admission medications   Medication Sig Start Date End Date Taking? Authorizing Provider  albuterol (PROVENTIL) (2.5 MG/3ML) 0.083% nebulizer solution Inhale into the lungs.    [provider]  Fluticasone-Salmeterol (ADVAIR DISKUS) 250-50 MCG/DOSE AEPB Inhale 1 puff into the lungs 2 (two) times daily. 03/14/15 01/16/26   Beers, Charmayne Sheer, PA-C  ibuprofen (ADVIL,MOTRIN) 600 MG tablet Take 1 tablet (600 mg total) by mouth every 8 (eight) hours as needed. Patient not taking: Reported on 01/16/2017 06/05/16   Governor Rooks, MD  insulin aspart (NOVOLOG) 100 UNIT/ML FlexPen Inject 20-60 Units into the skin 3 (three) times daily. Pt uses 20 units in the morning, 20 units at lunch, and 70 units at bedtime.    [provider]  Insulin Detemir (LEVEMIR) 100 UNIT/ML Pen Inject 10 Units into the skin at bedtime.    [provider]  oxyCODONE-acetaminophen (PERCOCET) 10-325 MG tablet Take by mouth.    [provider]  pantoprazole (PROTONIX) 40 MG tablet Take 40 mg by mouth daily.    [provider]  warfarin (COUMADIN) 10 MG tablet Take 10 mg by mouth daily.    [provider]    Allergies Eggs or egg-derived products    Social History Social History   Tobacco Use  . Smoking status: Current Every Day Smoker    Packs/day: 1.50    Years: 47.00    Pack years: 70.50    Types: Cigarettes  . Smokeless tobacco: Never Used  . Tobacco comment: Smoking History 2.5-started at age 70  Substance Use Topics  . Alcohol use: No    Alcohol/week: 0.0 standard drinks  . Drug use: No    Review of Systems Patient denies headaches, rhinorrhea, blurry vision, numbness, shortness of  breath, chest pain, edema, cough, abdominal pain, nausea, vomiting, diarrhea, dysuria, fevers, rashes or hallucinations unless otherwise stated above in HPI. ____________________________________________   PHYSICAL EXAM:  VITAL SIGNS: Vitals:   04/08/18 2059  BP: 137/76  Pulse: (!) 107  Resp: 19  Temp: 98.3 F (36.8 C)  SpO2: 98%    Constitutional: Alert and oriented.  Eyes: Conjunctivae are normal.  Head: Atraumatic. Nose: No congestion/rhinnorhea. Mouth/Throat: Mucous membranes are moist.   Neck: No stridor. Painless ROM.  Cardiovascular: Normal rate, regular rhythm. Grossly normal heart  sounds.  Good peripheral circulation. Respiratory: Normal respiratory effort.  No retractions. Lungs CTAB. Gastrointestinal: Soft and nontender. No distention. No abdominal bruits. No CVA tenderness. Genitourinary:  Musculoskeletal: Status post right fifth ray amputation with chronic wound as well as purulent drainage from ulcerative wound to the base of the first toe with streaking erythema.  Palpable PT or DP pulses but there is brisk cap refill remainder of the toes.  Has chronic ulcerative change to the dorsal aspect of the midfoot.  No joint effusions. Neurologic:  Normal speech and language. No gross focal neurologic deficits are appreciated. No facial droop Skin:  Skin is warm, dry and intact. No rash noted. Psychiatric: Mood and affect are normal. Speech and behavior are normal.  ____________________________________________   LABS (all labs ordered are listed, but only abnormal results are displayed)  Results for orders placed or performed during the hospital encounter of 04/08/18 (from the past 24 hour(s))  CBC with Differential     Status: None   Collection Time: 04/08/18  9:21 PM  Result Value Ref Range   WBC 7.2 4.0 - 10.5 K/uL   RBC 4.24 3.87 - 5.11 MIL/uL   Hemoglobin 13.2 12.0 - 15.0 g/dL   HCT 16.1 09.6 - 04.5 %   MCV 97.4 80.0 - 100.0 fL   MCH 31.1 26.0 - 34.0 pg   MCHC 32.0 30.0 - 36.0 g/dL   RDW 40.9 81.1 - 91.4 %   Platelets 228 150 - 400 K/uL   nRBC 0.0 0.0 - 0.2 %   Neutrophils Relative % 72 %   Neutro Abs 5.2 1.7 - 7.7 K/uL   Lymphocytes Relative 17 %   Lymphs Abs 1.2 0.7 - 4.0 K/uL   Monocytes Relative 8 %   Monocytes Absolute 0.5 0.1 - 1.0 K/uL   Eosinophils Relative 3 %   Eosinophils Absolute 0.2 0.0 - 0.5 K/uL   Basophils Relative 0 %   Basophils Absolute 0.0 0.0 - 0.1 K/uL   Immature Granulocytes 0 %   Abs Immature Granulocytes 0.01 0.00 - 0.07 K/uL  Comprehensive metabolic panel     Status: Abnormal   Collection Time: 04/08/18  9:21 PM  Result  Value Ref Range   Sodium 137 135 - 145 mmol/L   Potassium 4.3 3.5 - 5.1 mmol/L   Chloride 102 98 - 111 mmol/L   CO2 28 22 - 32 mmol/L   Glucose, Bld 404 (H) 70 - 99 mg/dL   BUN 20 8 - 23 mg/dL   Creatinine, Ser 7.82 0.44 - 1.00 mg/dL   Calcium 8.4 (L) 8.9 - 10.3 mg/dL   Total Protein 6.9 6.5 - 8.1 g/dL   Albumin 3.3 (L) 3.5 - 5.0 g/dL   AST 17 15 - 41 U/L   ALT 29 0 - 44 U/L   Alkaline Phosphatase 206 (H) 38 - 126 U/L   Total Bilirubin 0.4 0.3 - 1.2 mg/dL   GFR calc non Af Amer >  60 >60 mL/min   GFR calc Af Amer >60 >60 mL/min   Anion gap 7 5 - 15  Lactic acid, plasma     Status: Abnormal   Collection Time: 04/08/18  9:22 PM  Result Value Ref Range   Lactic Acid, Venous 2.1 (HH) 0.5 - 1.9 mmol/L  Glucose, capillary     Status: Abnormal   Collection Time: 04/08/18  9:27 PM  Result Value Ref Range   Glucose-Capillary 374 (H) 70 - 99 mg/dL   ____________________________________________ ____________________________________________  RADIOLOGY  I personally reviewed all radiographic images ordered to evaluate for the above acute complaints and reviewed radiology reports and findings.  These findings were personally discussed with the patient.  Please see medical record for radiology report.  ____________________________________________   PROCEDURES  Procedure(s) performed:  Procedures    Critical Care performed: no ____________________________________________   INITIAL IMPRESSION / ASSESSMENT AND PLAN / ED COURSE  Pertinent labs & imaging results that were available during my care of the patient were reviewed by me and considered in my medical decision making (see chart for details).   DDX: gangrene, diabetic foot ulcer, cellulitis, PAD  SHLONDA DOLLOFF is a 62 y.o. who presents to the ED with chronic wound of the right foot now becoming more purulent with surrounding erythema.  Does have a fair amount of ulceration to the base of the toe.  X-ray does show gas  concordant with the same area..  No evidence of necrotizing fasciitis.  There is evidence of worsening gangrenous changes of the foot therefore based on her presentation severe PAD with poorly controlled diabetes I do believe patient should be admitted to the hospital for IV antibiotics and further medical management.  Have discussed with the patient and available family all diagnostics and treatments performed thus far and all questions were answered to the best of my ability. The patient demonstrates understanding and agreement with plan.       As part of my medical decision making, I reviewed the following data within the electronic MEDICAL RECORD NUMBER Nursing notes reviewed and incorporated, Labs reviewed, notes from prior ED visits.   ____________________________________________   FINAL CLINICAL IMPRESSION(S) / ED DIAGNOSES  Final diagnoses:  Gangrene of foot (HCC)  Hyperglycemia      NEW MEDICATIONS STARTED DURING THIS VISIT:  New Prescriptions   No medications on file     Note:  This document was prepared using Dragon voice recognition software and may include unintentional dictation errors.    Willy Eddy, MD 04/08/18 2240

## 2018-04-08 NOTE — H&P (Signed)
[facility] - [place] [facility]   PATIENT NAME: April [name]    MR#:  [id number]  DATE OF BIRTH:  1956-04-16  DATE OF ADMISSION:  04/08/2018  PRIMARY CARE PHYSICIAN: Mickel Fuchs, MD   REQUESTING/REFERRING PHYSICIAN: Roxan Hockey, MD  CHIEF COMPLAINT:   Chief Complaint  Patient presents with  . Foot Pain    HISTORY OF PRESENT ILLNESS:  April [name]  is a 62 y.o. female who presents with chief complaint as above.  Patient presents with right foot ulcers.  She has 2 chronic ulcers on the dorsum and lateral aspect of her feet that are partially healed.  She has a new blistering early ulceration just proximal to the base of her great toe.  She has some surrounding cellulitis.  She is a diabetic.  Hospitalist were called for admission  PAST MEDICAL HISTORY:   Past Medical History:  Diagnosis Date  . Anxiety   . Colon polyps   . COPD (chronic obstructive pulmonary disease) (HCC)   . Diabetes mellitus without complication (HCC)   . DVT (deep venous thrombosis) (HCC)   . DVT (deep venous thrombosis) (HCC)   . History of colonic polyps   . Hyperlipidemia   . Peripheral artery disease (HCC)   . Pulmonary nodules/lesions, multiple 08/2012   NEGATIVE PET SCAN  . Retroperitoneal abscess (HCC)    history of in 04/2013  . Tobacco abuse      PAST SURGICAL HISTORY:   Past Surgical History:  Procedure Laterality Date  . CHOLECYSTECTOMY    . COLONOSCOPY  05/2007     SOCIAL HISTORY:   Social History   Tobacco Use  . Smoking status: Current Every Day Smoker    Packs/day: 1.50    Years: 47.00    Pack years: 70.50    Types: Cigarettes  . Smokeless tobacco: Never Used  . Tobacco comment: Smoking History 2.5-started at age 35  Substance Use Topics  . Alcohol use: No    Alcohol/week: 0.0 standard drinks     FAMILY HISTORY:   Family History  Problem Relation Age of Onset  . Brain cancer Mother      DRUG ALLERGIES:   Allergies   Allergen Reactions  . Eggs Or Egg-Derived Products Diarrhea, Itching and Nausea And Vomiting    MEDICATIONS AT HOME:   Prior to Admission medications   Medication Sig Start Date End Date Taking? Authorizing Provider  albuterol (PROVENTIL HFA;VENTOLIN HFA) 108 (90 Base) MCG/ACT inhaler Inhale 2 puffs into the lungs every 6 (six) hours as needed for wheezing or shortness of breath.   Yes [provider]  albuterol (PROVENTIL) (2.5 MG/3ML) 0.083% nebulizer solution Inhale 2.5 mg into the lungs every 6 (six) hours as needed for wheezing or shortness of breath.    Yes [provider]  aspirin 81 MG chewable tablet Chew 81 mg by mouth daily. 05/30/17  Yes [provider]  atorvastatin (LIPITOR) 40 MG tablet Take 40 mg by mouth daily. 10/24/17 10/24/18 Yes [provider]  Fluticasone-Salmeterol (ADVAIR DISKUS) 250-50 MCG/DOSE AEPB Inhale 1 puff into the lungs 2 (two) times daily. 03/14/15 01/16/26 Yes Beers, Charmayne Sheer, PA-C  oxyCODONE-acetaminophen (PERCOCET) 10-325 MG tablet Take 1 tablet by mouth every 6 (six) hours as needed for pain.    Yes [provider]  rivaroxaban (XARELTO) 10 MG TABS tablet Take 10 mg by mouth every evening.   Yes [provider]  sodium hypochlorite (DAKIN'S 1/4 STRENGTH) 0.125 % SOLN Apply 1  application topically daily. Use as a wound cleanser, pat dry, then apply dressings 12/18/17  Yes [provider]  ibuprofen (ADVIL,MOTRIN) 600 MG tablet Take 1 tablet (600 mg total) by mouth every 8 (eight) hours as needed. Patient not taking: Reported on 01/16/2017 06/05/16   Governor Rooks, MD    REVIEW OF SYSTEMS:  Review of Systems  Constitutional: Negative for chills, fever, malaise/fatigue and weight loss.  HENT: Negative for ear pain, hearing loss and tinnitus.   Eyes: Negative for blurred vision, double vision, pain and redness.  Respiratory: Negative for cough, hemoptysis and shortness of breath.   Cardiovascular:  Negative for chest pain, palpitations, orthopnea and leg swelling.  Gastrointestinal: Negative for abdominal pain, constipation, diarrhea, nausea and vomiting.  Genitourinary: Negative for dysuria, frequency and hematuria.  Musculoskeletal: Negative for back pain, joint pain and neck pain.  Skin:       Chronic right foot ulcerations, new right foot ulceration as per HPI with surrounding erythema  Neurological: Negative for dizziness, tremors, focal weakness and weakness.  Endo/Heme/Allergies: Negative for polydipsia. Does not bruise/bleed easily.  Psychiatric/Behavioral: Negative for depression. The patient is not nervous/anxious and does not have insomnia.      VITAL SIGNS:   Vitals:   04/08/18 2059 04/08/18 2100  BP: 137/76   Pulse: (!) 107   Resp: 19   Temp: 98.3 F (36.8 C)   TempSrc: Oral   SpO2: 98%   Weight:  37.6 kg  Height:  5\' 2"  (1.575 m)   Wt Readings from Last 3 Encounters:  04/08/18 37.6 kg  05/17/17 42.2 kg  01/16/17 42.1 kg    PHYSICAL EXAMINATION:  Physical Exam  Vitals reviewed. Constitutional: She is oriented to person, place, and time. She appears well-developed and well-nourished. No distress.  HENT:  Head: Normocephalic and atraumatic.  Mouth/Throat: Oropharynx is clear and moist.  Eyes: Pupils are equal, round, and reactive to light. Conjunctivae and EOM are normal. No scleral icterus.  Neck: Normal range of motion. Neck supple. No JVD present. No thyromegaly present.  Cardiovascular: Normal rate, regular rhythm and intact distal pulses. Exam reveals no gallop and no friction rub.  No murmur heard. Respiratory: Effort normal and breath sounds normal. No respiratory distress. She has no wheezes. She has no rales.  GI: Soft. Bowel sounds are normal. She exhibits no distension. There is no tenderness.  Musculoskeletal: Normal range of motion. She exhibits no edema.  No arthritis, no gout  Lymphadenopathy:    She has no cervical adenopathy.   Neurological: She is alert and oriented to person, place, and time. No cranial nerve deficit.  No dysarthria, no aphasia  Skin: Skin is warm and dry. No rash noted. There is erythema (With ulceration and small blister on the ball of her right foot).  Psychiatric: She has a normal mood and affect. Her behavior is normal. Judgment and thought content normal.    LABORATORY PANEL:   CBC Recent Labs  Lab 04/08/18 2121  WBC 7.2  HGB 13.2  HCT 41.3  PLT 228   ------------------------------------------------------------------------------------------------------------------  Chemistries  Recent Labs  Lab 04/08/18 2121  NA 137  K 4.3  CL 102  CO2 28  GLUCOSE 404*  BUN 20  CREATININE 0.87  CALCIUM 8.4*  AST 17  ALT 29  ALKPHOS 206*  BILITOT 0.4   ------------------------------------------------------------------------------------------------------------------  Cardiac Enzymes No results for input(s): TROPONINI in the last 168 hours. ------------------------------------------------------------------------------------------------------------------  RADIOLOGY:  Dg Foot Complete Right  Result Date: 04/08/2018 CLINICAL DATA:  Swelling to the right foot for 3 days. Blister of the great toe. Amputation a year ago. No injury. EXAM: RIGHT FOOT COMPLETE - 3+ VIEW COMPARISON:  05/17/2017 FINDINGS: Interval amputation of the right fifth ray at the mid metatarsal level since previous study. Focal cortical scalloping along the distal fifth metatarsal stump probably representing postoperative change. Can't entirely exclude sequela of chronic osteomyelitis. Diffuse bone demineralization. No evidence of acute fracture or dislocation. No definite bone sclerosis or cortical erosion to suggest new osteomyelitis. No expansile or destructive bone lesions appreciated. There is small amount of soft tissue gas medial to the first metatarsal head likely representing small ulceration. No radiopaque foreign  bodies. Vascular calcifications. IMPRESSION: 1. 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 Focal cortical scalloping is probably postoperative. 2. No definite evidence of any new osteomyelitis. 3. Soft tissue gas adjacent to the first metatarsal head may reflect ulceration. Electronically Signed   By: Burman Nieves M.D.   On: 04/08/2018 21:59    EKG:   Orders placed or performed during the hospital encounter of 06/05/16  . ED EKG  . ED EKG  . 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 Labs, imaging and/or ECG reviewed by provider and discussed with patient/family. Management plans discussed with the patient and/or family.  DVT PROPHYLAXIS: Systemic anticoagulation  GI PROPHYLAXIS:  None  ADMISSION STATUS: Inpatient     CODE STATUS: Full  TOTAL TIME TAKING CARE OF THIS PATIENT: 45 minutes.   Montell Leopard FIELDING 04/08/2018, 11:40 PM  [employer]  [phone]  CC: Primary care physician; Mickel Fuchs, MD  Note:  This document was prepared using Dragon voice recognition software and may include unintentional dictation errors.

## 2018-04-09 LAB — BASIC METABOLIC PANEL
Anion gap: 7 (ref 5–15)
BUN: 19 mg/dL (ref 8–23)
CALCIUM: 8.7 mg/dL — AB (ref 8.9–10.3)
CO2: 29 mmol/L (ref 22–32)
Chloride: 104 mmol/L (ref 98–111)
Creatinine, Ser: 0.83 mg/dL (ref 0.44–1.00)
GFR calc Af Amer: >60 mL/min (ref >60)
GLUCOSE: 326 mg/dL — AB (ref 70–99)
Potassium: 4.3 mmol/L (ref 3.5–5.1)
Sodium: 140 mmol/L (ref 135–145)

## 2018-04-09 LAB — GLUCOSE, CAPILLARY
Glucose-Capillary: 117 mg/dL — ABNORMAL HIGH (ref 70–99)
Glucose-Capillary: 124 mg/dL — ABNORMAL HIGH (ref 70–99)
Glucose-Capillary: 134 mg/dL — ABNORMAL HIGH (ref 70–99)
Glucose-Capillary: 264 mg/dL — ABNORMAL HIGH (ref 70–99)
Glucose-Capillary: 302 mg/dL — ABNORMAL HIGH (ref 70–99)

## 2018-04-09 LAB — CBC
HCT: 38.9 % (ref 36.0–46.0)
Hemoglobin: 12.5 g/dL (ref 12.0–15.0)
MCH: 31.1 pg (ref 26.0–34.0)
MCHC: 32.1 g/dL (ref 30.0–36.0)
MCV: 96.8 fL (ref 80.0–100.0)
PLATELETS: 219 10*3/uL (ref 150–400)
RBC: 4.02 MIL/uL (ref 3.87–5.11)
RDW: 14.6 % (ref 11.5–15.5)
WBC: 6.4 10*3/uL (ref 4.0–10.5)
nRBC: 0 % (ref 0.0–0.2)

## 2018-04-09 LAB — LACTIC ACID, PLASMA: Lactic Acid, Venous: 1 mmol/L (ref 0.5–1.9)

## 2018-04-09 LAB — HEMOGLOBIN A1C
Hgb A1c MFr Bld: 10.3 % — ABNORMAL HIGH (ref 4.8–5.6)
Mean Plasma Glucose: 248.91 mg/dL

## 2018-04-09 MED ORDER — RIVAROXABAN 10 MG PO TABS
10.0000 mg | ORAL_TABLET | Freq: Every evening | ORAL | Status: DC
  Administered 2018-04-09 – 2018-04-10 (×2): 10 mg via ORAL
  Filled 2018-04-09 (×3): qty 1

## 2018-04-09 MED ORDER — SODIUM CHLORIDE 0.9 % IV SOLN
INTRAVENOUS | Status: DC
  Administered 2018-04-09: 01:00:00 via INTRAVENOUS

## 2018-04-09 MED ORDER — ASPIRIN 81 MG PO CHEW
81.0000 mg | CHEWABLE_TABLET | Freq: Every day | ORAL | Status: DC
  Administered 2018-04-10 – 2018-04-11 (×2): 81 mg via ORAL
  Filled 2018-04-09 (×2): qty 1

## 2018-04-09 MED ORDER — ATORVASTATIN CALCIUM 20 MG PO TABS
40.0000 mg | ORAL_TABLET | Freq: Every day | ORAL | Status: DC
  Administered 2018-04-09 – 2018-04-10 (×2): 40 mg via ORAL
  Filled 2018-04-09 (×2): qty 2

## 2018-04-09 MED ORDER — MORPHINE SULFATE (PF) 2 MG/ML IV SOLN
2.0000 mg | Freq: Once | INTRAVENOUS | Status: AC
  Administered 2018-04-09: 2 mg via INTRAVENOUS

## 2018-04-09 MED ORDER — PREMIER PROTEIN SHAKE: 11.0000 [oz_av] | Freq: Two times a day (BID) | ORAL | Status: DC

## 2018-04-09 MED ORDER — GUAIFENESIN-DM 100-10 MG/5ML PO SYRP
5.0000 mL | ORAL_SOLUTION | ORAL | Status: DC | PRN
  Administered 2018-04-09 (×3): 5 mL via ORAL
  Filled 2018-04-09 (×3): qty 5

## 2018-04-09 MED ORDER — MORPHINE SULFATE (PF) 2 MG/ML IV SOLN: 2.0000 mg | INTRAVENOUS | Status: DC | PRN

## 2018-04-09 MED ORDER — INSULIN ASPART 100 UNIT/ML ~~LOC~~ SOLN
0.0000 [IU] | Freq: Every day | SUBCUTANEOUS | Status: DC
  Administered 2018-04-09: 4 [IU] via SUBCUTANEOUS
  Administered 2018-04-10: 3 [IU] via SUBCUTANEOUS
  Filled 2018-04-09 (×2): qty 1

## 2018-04-09 MED ORDER — ACETAMINOPHEN 325 MG PO TABS
650.0000 mg | ORAL_TABLET | Freq: Four times a day (QID) | ORAL | Status: DC | PRN
  Administered 2018-04-09 – 2018-04-10 (×2): 650 mg via ORAL
  Filled 2018-04-09 (×2): qty 2

## 2018-04-09 MED ORDER — ONDANSETRON HCL 4 MG PO TABS: 4.0000 mg | ORAL_TABLET | Freq: Four times a day (QID) | ORAL | Status: DC | PRN

## 2018-04-09 MED ORDER — OXYCODONE HCL 5 MG PO TABS: 5.0000 mg | ORAL_TABLET | Freq: Four times a day (QID) | ORAL | Status: DC | PRN

## 2018-04-09 MED ORDER — OCUVITE-LUTEIN PO CAPS
1.0000 | ORAL_CAPSULE | Freq: Every day | ORAL | Status: DC
  Administered 2018-04-10 – 2018-04-11 (×2): 1 via ORAL
  Filled 2018-04-09 (×3): qty 1

## 2018-04-09 MED ORDER — MORPHINE SULFATE (PF) 2 MG/ML IV SOLN
INTRAVENOUS | Status: AC
  Administered 2018-04-09: 2 mg via INTRAVENOUS
  Filled 2018-04-09: qty 1

## 2018-04-09 MED ORDER — MOMETASONE FURO-FORMOTEROL FUM 200-5 MCG/ACT IN AERO
2.0000 | INHALATION_SPRAY | Freq: Two times a day (BID) | RESPIRATORY_TRACT | Status: DC
  Administered 2018-04-09 – 2018-04-10 (×4): 2 via RESPIRATORY_TRACT
  Filled 2018-04-09: qty 8.8

## 2018-04-09 MED ORDER — VITAMIN C 500 MG PO TABS
250.0000 mg | ORAL_TABLET | Freq: Every day | ORAL | Status: DC
  Administered 2018-04-10: 250 mg via ORAL
  Filled 2018-04-09: qty 1

## 2018-04-09 MED ORDER — COLLAGENASE 250 UNIT/GM EX OINT
TOPICAL_OINTMENT | Freq: Every day | CUTANEOUS | Status: DC
  Administered 2018-04-09 – 2018-04-10 (×2): via TOPICAL
  Filled 2018-04-09: qty 30

## 2018-04-09 MED ORDER — ONDANSETRON HCL 4 MG/2ML IJ SOLN: 4.0000 mg | Freq: Four times a day (QID) | INTRAMUSCULAR | Status: DC | PRN

## 2018-04-09 MED ORDER — VANCOMYCIN HCL 500 MG IV SOLR
500.0000 mg | INTRAVENOUS | Status: DC
  Administered 2018-04-10 – 2018-04-11 (×2): 500 mg via INTRAVENOUS
  Filled 2018-04-09 (×3): qty 500

## 2018-04-09 MED ORDER — ACETAMINOPHEN 650 MG RE SUPP: 650.0000 mg | Freq: Four times a day (QID) | RECTAL | Status: DC | PRN

## 2018-04-09 MED ORDER — INSULIN ASPART 100 UNIT/ML ~~LOC~~ SOLN
0.0000 [IU] | Freq: Three times a day (TID) | SUBCUTANEOUS | Status: DC
  Administered 2018-04-09: 5 [IU] via SUBCUTANEOUS
  Administered 2018-04-09: 1 [IU] via SUBCUTANEOUS
  Administered 2018-04-10: 2 [IU] via SUBCUTANEOUS
  Administered 2018-04-10: 3 [IU] via SUBCUTANEOUS
  Administered 2018-04-10 – 2018-04-11 (×2): 5 [IU] via SUBCUTANEOUS
  Filled 2018-04-09 (×6): qty 1

## 2018-04-09 MED ORDER — SODIUM CHLORIDE 0.9 % IV SOLN
1.0000 g | Freq: Two times a day (BID) | INTRAVENOUS | Status: DC
  Administered 2018-04-09 – 2018-04-10 (×4): 1 g via INTRAVENOUS
  Filled 2018-04-09 (×6): qty 1

## 2018-04-09 NOTE — Consult Note (Addendum)
WOC Nurse wound consult note Reason for Consult: Consult requested for bilat feet.  Pt was wearing shoes which caused the wounds, according to the bedside nurse. Wound type: Left heel unstageable pressure injury; 50% intact blistered skin surrounding 50% slough/eschar in the center; 3X5X3.5cm of white maceration surrounding open wound: .5X.5cm of slough/eschar.  No odor, small amt tan drainage, no fluctuance. Right outer foot with dry scabbed full thickness wound; 2.5X1.5X.2cm, dark brown eschar/slough, no odor, drainage, or fluctuance, small amt yellow drainage Right anterior foot with full thickness wound; 90% red, 10% slough, small amt yellow drainage Right inner foot with .5X.5cm dry dark brown callous, surrounded by white blistered area 1X1cm, no odor, drainage, or fluctuance Pressure Injury POA: Yes Dressing procedure/placement/frequency: Float heel to reduce pressure.  Santyl ointment to provide enzymatic debridement to all areas.  Discussed plan of care with patient. Please re-consult if further assistance is needed.  Thank-you,  Cammie Mcgee MSN, RN, CWOCN, [place], CNS [phone]

## 2018-04-09 NOTE — Progress Notes (Signed)
Initial Nutrition Assessment  DOCUMENTATION CODES:   Severe malnutrition in context of chronic illness  INTERVENTION:   Premier Protein BID, each supplement provides 160 kcal and 30 grams of protein.   Ocuvite daily for wound healing (provides zinc, vitamin A, vitamin C, Vitamin E, copper, and selenium)  Vitamin C 265m po daily  NUTRITION DIAGNOSIS:   Severe Malnutrition related to chronic illness(COPD, uncontrolled DM) as evidenced by 25 percent weight loss in 9 months, severe fat depletion, severe muscle depletion.  GOAL:   Patient will meet greater than or equal to 90% of their needs  MONITOR:   PO intake, Supplement acceptance, Labs, Weight trends, Skin, I & O's  REASON FOR ASSESSMENT:   Other (Comment)(low BMI)    ASSESSMENT:   62y/o female with h/o DVT, COPD, DM admitted with right diabetic foot ulcer with surrounding cellulitis now s/p bedside debridement by podiatry 10/9   Met with pt in room today. Pt reports good appetite and oral intake pta. Pt eating lunch at time of RD visit today. Pt was NPO this morning for I & D. Per chart, pt has lost 23lbs(25%) over the past 9 months which is severe. Pt also confirms weight loss. Pt does not drink any supplements at home. RD discussed with pt the importance of controlling her DM and also getting adequate calories and protein needed to preserve lean muscle and promote wound healing. Pt would like to have chocolate Premier Protein. RD will also order vitamins to encourage wound healing. Provided pt with brief DM diet education today; focused mainly on avoiding simple sugars and focusing on nutrient dense foods.   Medications reviewed and include: aspirin, insulin, cefepime, vancomycin   Labs reviewed: cbgs- 374, 302, 117, 134 x 24hrs  NUTRITION - FOCUSED PHYSICAL EXAM:    Most Recent Value  Orbital Region  Moderate depletion  Upper Arm Region  Severe depletion  Thoracic and Lumbar Region  Severe depletion  Buccal  Region  Moderate depletion  Temple Region  Moderate depletion  Clavicle Bone Region  Severe depletion  Clavicle and Acromion Bone Region  Severe depletion  Scapular Bone Region  Moderate depletion  Dorsal Hand  Moderate depletion  Patellar Region  Severe depletion  Anterior Thigh Region  Severe depletion  Posterior Calf Region  Severe depletion  Edema (RD Assessment)  None  Hair  Reviewed  Eyes  Reviewed  Mouth  Reviewed  Skin  Reviewed  Nails  Reviewed     Diet Order:   Diet Order            Diet Carb Modified Fluid consistency: Thin; Room service appropriate? Yes  Diet effective now             EDUCATION NEEDS:   Education needs have been addressed  Skin:  Skin Assessment: Reviewed RN Assessment(unstageable pressure injury L heel 3X5X3.5cm, unstageable pressure injury R outer foot 2.5X1.5X.2cm, R inner foot .5X.5cm dry dark brown callous, white blistered area R foot 1X1cm)  Last BM:  10/8  Height:   Ht Readings from Last 1 Encounters:  04/09/18 _0  (1.575 m)    Weight:   Wt Readings from Last 1 Encounters:  04/09/18 31.8 kg    Ideal Body Weight:  50 kg  BMI:  Body mass index is 12.82 kg/m.  Estimated Nutritional Needs:   Kcal:  1200-1400kcal/day   Protein:  51-57g/day   Fluid:  1L/day  CKoleen DistanceMS, RD, LDN Pager #- [phone]#- [phone] Hours Pager: [phone]

## 2018-04-09 NOTE — Progress Notes (Signed)
[facility]   PATIENT NAME: April [name]    MR#:  [id number]  DATE OF BIRTH:  05/15/1956  SUBJECTIVE:  CHIEF COMPLAINT:   Chief Complaint  Patient presents with  . Foot Pain   Pain right foot  REVIEW OF SYSTEMS:    Review of Systems  Constitutional: Positive for malaise/fatigue. Negative for chills and fever.  HENT: Negative for sore throat.   Eyes: Negative for blurred vision, double vision and pain.  Respiratory: Negative for cough, hemoptysis, shortness of breath and wheezing.   Cardiovascular: Negative for chest pain, palpitations, orthopnea and leg swelling.  Gastrointestinal: Negative for abdominal pain, constipation, diarrhea, heartburn, nausea and vomiting.  Genitourinary: Negative for dysuria and hematuria.  Musculoskeletal: Positive for joint pain. Negative for back pain.  Skin: Negative for rash.  Neurological: Negative for sensory change, speech change, focal weakness and headaches.  Endo/Heme/Allergies: Does not bruise/bleed easily.  Psychiatric/Behavioral: Negative for depression. The patient is not nervous/anxious.     DRUG ALLERGIES:   Allergies  Allergen Reactions  . Eggs Or Egg-Derived Products Diarrhea, Itching and Nausea And Vomiting    VITALS:  Blood pressure 118/70, pulse (!) 106, temperature 99.6 F (37.6 C), temperature source Oral, resp. rate (!) 21, height 5\' 2"  (1.575 m), weight 31.8 kg, SpO2 99 %.  PHYSICAL EXAMINATION:   Physical Exam  GENERAL:  62 y.o.-year-old patient lying in the bed with no acute distress.  EYES: Pupils equal, round, reactive to light and accommodation. No scleral icterus. Extraocular muscles intact.  HEENT: Head atraumatic, normocephalic. Oropharynx and nasopharynx clear.  NECK:  Supple, no jugular venous distention. No thyroid enlargement, no tenderness.  LUNGS: Normal breath sounds bilaterally, no wheezing, rales, rhonchi. No use of accessory muscles of respiration.   CARDIOVASCULAR: S1, S2 normal. No murmurs, rubs, or gallops.  ABDOMEN: Soft, nontender, nondistended. Bowel sounds present. No organomegaly or mass.  EXTREMITIES: No cyanosis, clubbing or edema b/l.    NEUROLOGIC: Cranial nerves II through XII are intact. No focal Motor or sensory deficits b/l.   PSYCHIATRIC: The patient is alert and oriented x 3. SKIN: No obvious rash, lesion, or ulcer.   LABORATORY PANEL:   CBC Recent Labs  Lab 04/09/18 0052  WBC 6.4  HGB 12.5  HCT 38.9  PLT 219   ------------------------------------------------------------------------------------------------------------------ Chemistries  Recent Labs  Lab 04/08/18 2121 04/09/18 0052  NA 137 140  K 4.3 4.3  CL 102 104  CO2 28 29  GLUCOSE 404* 326*  BUN 20 19  CREATININE 0.87 0.83  CALCIUM 8.4* 8.7*  AST 17  --   ALT 29  --   ALKPHOS 206*  --   BILITOT 0.4  --    ------------------------------------------------------------------------------------------------------------------  Cardiac Enzymes No results for input(s): TROPONINI in the last 168 hours. ------------------------------------------------------------------------------------------------------------------  RADIOLOGY:  Dg Foot Complete Right  Result Date: 04/08/2018 CLINICAL DATA:  Swelling to the right foot for 3 days. Blister of the great toe. Amputation a year ago. No injury. EXAM: RIGHT FOOT COMPLETE - 3+ VIEW COMPARISON:  05/17/2017 FINDINGS: Interval amputation of the right fifth ray at the mid metatarsal level since previous study. Focal cortical scalloping along the distal fifth metatarsal stump probably representing postoperative change. Can't entirely exclude sequela of chronic osteomyelitis. Diffuse bone demineralization. No evidence of acute fracture or dislocation. No definite bone sclerosis or cortical erosion to suggest new osteomyelitis. No expansile or destructive bone lesions appreciated. There is small amount of soft tissue  gas medial to  the first metatarsal head likely representing small ulceration. No radiopaque foreign bodies. Vascular calcifications. IMPRESSION: 1. 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 Focal cortical scalloping is probably postoperative. 2. No definite evidence of any new osteomyelitis. 3. Soft tissue gas adjacent to the first metatarsal head may reflect ulceration. Electronically Signed   By: April [name] M.D.   On: 04/08/2018 21:59     ASSESSMENT AND PLAN:   *Right diabetic foot ulcer with surrounding cellulitis.  Status post bedside debridement by podiatry.  On IV antibiotics.  Wound cultures pending.  *Diabetes mellitus.  Check hemoglobin A1c.  Sliding scale insulin.  *Recurrent DVT of left lower extremity.  On anticoagulation.  *COPD with no exacerbation.  Continue inhalers and nebulizers as needed  All the records are reviewed and case discussed with Care Management/Social Worker Management plans discussed with the patient, family and they are in agreement.  CODE STATUS: DNR  DVT Prophylaxis: SCDs  TOTAL TIME TAKING CARE OF THIS PATIENT: 35 minutes.   POSSIBLE D/C IN 1-2 DAYS, DEPENDING ON CLINICAL CONDITION.  April [name] M.D on 04/09/2018 at 1:28 PM  Between 7am to 6pm - Pager - [phone]  After 6pm go to [email or web address] - password [email or web address]  [facility]  Office  [phone]  CC: Primary care physician; April Fuchs, MD  Note: This dictation was prepared with Dragon dictation along with smaller phrase technology. Any transcriptional errors that result from this process are unintentional.

## 2018-04-09 NOTE — Progress Notes (Signed)
Pharmacy Antibiotic Note  April [name] is a 62 y.o. female admitted on 04/08/2018 with diabetic foot infection.  Pharmacy has been consulted for vanc/cefepime dosing. Patient received vanc 1g, cefepime 1g and zosyn 3.375g IV x 1  Plan: Will continue vanc 500 mg IV q24h 30 hours post 1g dose.  Will draw trough 10/13 @ 0600 prior to 4th dose. Will start cefepime 1g IV q12h  Ke 0.0319 T1/2 ~ 24 hrs Goal trough 15 - 20 mcg/mL  Height: 5\' 2"  (157.5 cm) Weight: 70 lb 1.7 oz (31.8 kg) IBW/kg (Calculated) : 50.1  Temp (24hrs), Avg:98.2 F (36.8 C), Min:98 F (36.7 C), Max:98.3 F (36.8 C)  Recent Labs  Lab 04/08/18 2121 04/08/18 2122 04/09/18 0052  WBC 7.2  --  6.4  CREATININE 0.87  --  0.83  LATICACIDVEN  --  2.1*  --     Estimated Creatinine Clearance: 34.8 mL/min (by C-G formula based on SCr of 0.83 mg/dL).    Allergies  Allergen Reactions  . Eggs Or Egg-Derived Products Diarrhea, Itching and Nausea And Vomiting    Thank you for allowing pharmacy to be a part of this patient's care.  Thomasene Ripple, PharmD, BCPS Clinical Pharmacist 04/09/2018

## 2018-04-09 NOTE — Progress Notes (Signed)
Pharmacy Antibiotic Note  April [name] is a 62 y.o. female admitted on 04/08/2018 with diabetic foot infection.  Pharmacy has been consulted for vanc dosing. Patient received vanc 1g, cefepime 1g and zosyn 3.375g IV x 1  Plan: Will continue vanc 500 mg IV q24h 30 hours post 1g dose.  Will draw trough 10/12 @ 0600 prior to 4th overall dose. SCr in AM to evaluate renal function (vanc given after labs this AM).    Ke 0.033 T1/2 ~ 21 hrs Vd 22.3 L Goal trough 15 - 20 mcg/mL  Height: 5\' 2"  (157.5 cm) Weight: 70 lb 1.7 oz (31.8 kg) IBW/kg (Calculated) : 50.1  Temp (24hrs), Avg:98.6 F (37 C), Min:98 F (36.7 C), Max:99.6 F (37.6 C)  Recent Labs  Lab 04/08/18 2121 04/08/18 2122 04/09/18 0052  WBC 7.2  --  6.4  CREATININE 0.87  --  0.83  LATICACIDVEN  --  2.1* 1.0    Estimated Creatinine Clearance: 34.8 mL/min (by C-G formula based on SCr of 0.83 mg/dL).    Allergies  Allergen Reactions  . Eggs Or Egg-Derived Products Diarrhea, Itching and Nausea And Vomiting   Zosyn 10/8  Cefepime 10/9 >> Vanc 10/9 >>  WCx pending  Thank you for allowing pharmacy to be a part of this patient's care.  Crist Fat, PharmD, BCPS Clinical Pharmacist 04/09/2018 10:56 AM

## 2018-04-09 NOTE — Consult Note (Signed)
[facility]                                                      Patient Demographics  April [name], is a 62 y.o. female   MRN: [id number]   DOB - 1955/12/28  Admit Date - 04/08/2018    Outpatient Primary MD for the patient is Wroth, Honor Loh, MD  Consult requested in the Hospital by Hillary Bow, MD, On 04/09/2018    Reason for consult diabetic wounds to the right foot   With History of -  Past Medical History:  Diagnosis Date  . Anxiety   . Colon polyps   . COPD (chronic obstructive pulmonary disease) ([place])   . Diabetes mellitus without complication ([place])   . DVT (deep venous thrombosis) ([place])   . DVT (deep venous thrombosis) ([place])   . History of colonic polyps   . Hyperlipidemia   . Peripheral artery disease ([place])   . Pulmonary nodules/lesions, multiple 08/2012   NEGATIVE PET SCAN  . 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 Tobacco abuse       Past Surgical History:  Procedure Laterality Date  . CHOLECYSTECTOMY    . COLONOSCOPY  05/2007    in for   Chief Complaint  Patient presents with  . Foot Pain     HPI  April [name]  is a 62 y.o. female, patient is a history of diabetic foot infections to the right side she is had 1/5 ray amputation in the past.  She is had a long-standing ulcer on top of the right foot and now has a new lesion on the submetatarsal one area of the right foot.  Came to the emergency department last night.  She is been treated for this in the past at [facility].  Was admitted last night for cellulitis and elevated blood sugars.    Review of Systems    In addition to the HPI above,  No Fever-chills, No Headache, No changes with Vision or hearing, No problems swallowing food or Liquids, No Chest pain, Cough or Shortness of Breath, No Abdominal pain, No Nausea or Vommitting, Bowel movements  are regular, No Blood in stool or Urine, No dysuria, No new skin rashes or bruises, No new joints pains-aches,  No new weakness, tingling, numbness in any extremity, No recent weight gain or loss, No polyuria, polydypsia or polyphagia, No significant Mental Stressors.  A full 10 point Review of Systems was done, except as stated above, all other Review of Systems were negative.   Social History Social History   Tobacco Use  . Smoking status: Current Every Day Smoker    Packs/day: 1.50    Years: 47.00    Pack years: 70.50    Types: Cigarettes  . Smokeless tobacco: Never Used  . Tobacco comment: Smoking History 2.5-started at age 9  Substance Use Topics  . Alcohol use: No    Alcohol/week: 0.0 standard drinks    Family History Family History  Problem Relation Age of Onset  . Brain cancer Mother     Prior to Admission medications   Medication Sig Start Date End Date Taking? Authorizing Provider  albuterol (PROVENTIL HFA;VENTOLIN HFA) 108 (90 Base) MCG/ACT inhaler Inhale 2 puffs into the lungs every 6 (six) hours as needed for wheezing or  shortness of breath.   Yes [provider]  albuterol (PROVENTIL) (2.5 MG/3ML) 0.083% nebulizer solution Inhale 2.5 mg into the lungs every 6 (six) hours as needed for wheezing or shortness of breath.    Yes [provider]  aspirin 81 MG chewable tablet Chew 81 mg by mouth daily. 05/30/17  Yes [provider]  atorvastatin (LIPITOR) 40 MG tablet Take 40 mg by mouth daily. 10/24/17 10/24/18 Yes [provider]  Fluticasone-Salmeterol (ADVAIR DISKUS) 250-50 MCG/DOSE AEPB Inhale 1 puff into the lungs 2 (two) times daily. 03/14/15 01/16/26 Yes Beers, Pierce Crane, PA-C  oxyCODONE-acetaminophen (PERCOCET) 10-325 MG tablet Take 1 tablet by mouth every 6 (six) hours as needed for pain.    Yes [provider]  rivaroxaban (XARELTO) 10 MG TABS tablet Take 10 mg by mouth every evening.   Yes [provider]  sodium hypochlorite (DAKIN'S 1/4 STRENGTH) 0.125 % SOLN Apply 1 application topically daily. Use as a wound cleanser, pat dry, then apply dressings 12/18/17  Yes [provider]  ibuprofen (ADVIL,MOTRIN) 600 MG tablet Take 1 tablet (600 mg total) by mouth every 8 (eight) hours as needed. Patient not taking: Reported on 01/16/2017 06/05/16   Lisa Roca, MD    Anti-infectives (From admission, onward)   Start     Dose/Rate Route Frequency Ordered Stop   04/10/18 0700  vancomycin (VANCOCIN) 500 mg in sodium chloride 0.9 % 100 mL IVPB     500 mg 100 mL/hr over 60 Minutes Intravenous Every 24 hours 04/09/18 0116     04/09/18 0030  ceFEPIme (MAXIPIME) 1 g in sodium chloride 0.9 % 100 mL IVPB     1 g 200 mL/hr over 30 Minutes Intravenous Every 12 hours 04/09/18 0017     04/08/18 2245  vancomycin (VANCOCIN) IVPB 1000 mg/200 mL premix     1,000 mg 200 mL/hr over 60 Minutes Intravenous  Once 04/08/18 2238 04/09/18 0206   04/08/18 2245  piperacillin-tazobactam (ZOSYN) IVPB 3.375 g  Status:  Discontinued     3.375 g 100 mL/hr over 30 Minutes Intravenous  Once 04/08/18 2238 04/09/18 0018      Scheduled Meds: . aspirin  81 mg Oral Daily  . atorvastatin  40 mg Oral Daily  . collagenase   Topical Daily  . insulin aspart  0-5 Units Subcutaneous QHS  . insulin aspart  0-9 Units Subcutaneous TID WC  . mometasone-formoterol  2 puff Inhalation BID  . rivaroxaban  10 mg Oral QPM   Continuous Infusions: . ceFEPime (MAXIPIME) IV 1 g (04/09/18 0912)  . [START ON 04/10/2018] vancomycin     PRN Meds:.acetaminophen **OR** acetaminophen, guaiFENesin-dextromethorphan, morphine injection, ondansetron **OR** ondansetron (ZOFRAN) IV, oxyCODONE  Allergies  Allergen Reactions  . Eggs Or Egg-Derived Products Diarrhea, Itching and Nausea And Vomiting    Physical Exam  Vitals  Blood pressure 118/70, pulse (!) 106, temperature 99.6 F (37.6 C), temperature source Oral, resp. rate (!) 21, height  5' 2"  (1.575 m), weight 31.8 kg, SpO2 99 %.  Lower Extremity exam:  Vascular: Difficult to palpate bilateral.  Patient also smokes pack and half cigarettes a day.  Dermatological: 3 wounds on the right foot.  The lateral wound is a dry eschar approximately 8 mm diameter over the area where she had 1/5 ray amputation before.  This is non-cellulitic and does not appear to have any infection or drainage from the region.  Dorsally there is a wound approximately 2.5 cm in length and 1.8 cm in  width with several millimeters of depth mostly granular is not infected no of heavy drainage is noted. On the submetatarsal one area of the right foot there is a blister with some expanding cellulitis proximally and dorsally.  Debridement of this area yields an ulceration sub-met one that appears to be relatively superficial and the blistering medially is superficial as well.  This was all debrided and removed with a 15 scalpel blade through excisional debridement to remove that devitalized tissue and allow drainage to the area.  All this region appeared to be superficial.  Neurological: Likely some peripheral neuropathy but she was able to feel discomfort with some of the debridement.  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  Data Review  CBC Recent Labs  Lab 04/08/18 2121 04/09/18 0052  WBC 7.2 6.4  HGB 13.2 12.5  HCT 41.3 38.9  PLT 228 219  MCV 97.4 96.8  MCH 31.1 31.1  MCHC 32.0 32.1  RDW 14.7 14.6  LYMPHSABS 1.2  --   MONOABS 0.5  --   EOSABS 0.2  --   BASOSABS 0.0  --    ------------------------------------------------------------------------------------------------------------------  Chemistries  Recent Labs  Lab 04/08/18 2121 04/09/18 0052  NA 137 140  K 4.3 4.3  CL 102 104  CO2 28 29  GLUCOSE 404* 326*  BUN 20 19  CREATININE 0.87 0.83  CALCIUM 8.4* 8.7*  AST 17  --   ALT 29  --   ALKPHOS  206*  --   BILITOT 0.4  --    ------------------------------------------------------------------------------------ Urinalysis    Component Value Date/Time   COLORURINE YELLOW (A) 03/14/2015 1450   APPEARANCEUR HAZY (A) 03/14/2015 1450   APPEARANCEUR Clear 10/13/2014 1412   LABSPEC 1.010 03/14/2015 1450   LABSPEC 1.031 10/13/2014 1412   PHURINE 6.0 03/14/2015 1450   GLUCOSEU >500 (A) 03/14/2015 1450   GLUCOSEU >=500 10/13/2014 1412   HGBUR NEGATIVE 03/14/2015 1450   BILIRUBINUR NEGATIVE 03/14/2015 1450   BILIRUBINUR Negative 10/13/2014 1412   KETONESUR NEGATIVE 03/14/2015 1450   PROTEINUR NEGATIVE 03/14/2015 1450   NITRITE NEGATIVE 03/14/2015 1450   LEUKOCYTESUR 1+ (A) 03/14/2015 1450   LEUKOCYTESUR 1+ 10/13/2014 1412     Imaging results:   Dg Foot Complete Right  Result Date: 04/08/2018 CLINICAL DATA:  Swelling to the right foot for 3 days. Blister of the great toe. Amputation a year ago. No injury. EXAM: RIGHT FOOT COMPLETE - 3+ VIEW COMPARISON:  05/17/2017 FINDINGS: Interval amputation of the right fifth ray at the mid metatarsal level since previous study. Focal cortical scalloping along the distal fifth metatarsal stump probably representing postoperative change. Can't entirely exclude sequela of chronic osteomyelitis. Diffuse bone demineralization. No evidence of acute fracture or dislocation. No definite bone sclerosis or cortical erosion to suggest new osteomyelitis. No expansile or destructive bone lesions appreciated. There is small amount of soft tissue gas medial to the first metatarsal head likely representing small ulceration. No radiopaque foreign bodies. Vascular calcifications. IMPRESSION: 1. 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 Focal cortical scalloping is probably postoperative. 2. No definite evidence of any new osteomyelitis. 3. Soft tissue gas adjacent to the first metatarsal head may reflect ulceration. Electronically Signed   By: Lucienne Capers M.D.   On: 04/08/2018 21:59    Assessment & Plan: Diabetic ulcerations to the right foot there is some purulence and fluid buildup sub-met one on the right foot  with some cellulitis progressing from that but it is not too extreme.  Cultures were taken in the ER yesterday and we will see what those tend to show [place].  She is on antibiotics and intravenously at this point.  X-rays reviewed and show no evidence of osteo.  Bone changes at 5th met are secondary to resection.  I feel like she will do well with antibiotic therapy and will not likely need surgery.  I will prescribe wet-to-dry saline dressings to the wounds at this timeframe.  If she responds nicely over the next couple days likely be able to go home on oral antibiotics.  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

## 2018-04-09 NOTE — Procedures (Signed)
At bedside the submetatarsal 1 ulceration on the right foot was excisionally debrided with a 15 scalpel blade.  Pre-debridement showed only a small opening approximately 2 to 3 mm across but there is evidence of separation of skin and fluid and fluctuance underneath the skin in the region.  This was opened with a 15 scalpel blade and this blistered separated portion of skin was removed.  Fluid and purulence was drained.  The submetatarsal one area plantarly had a wound that was likely the original source and it was about 5 mm diameter with 3 mm of depth.  Total area of debridement included about 1.9 cm in length and 1.2 cm in width with a 3 mm of depth.  The patient tolerated procedure well and a wet-to-dry saline dressing was placed on the wound following the debridement.

## 2018-04-09 NOTE — Progress Notes (Signed)
Talked with patient about DNR bracelet. Patient expressed concerns about wanting to be a full code. Notified MD. No new orders at this time.

## 2018-04-09 NOTE — Progress Notes (Signed)
Advance care planning  Purpose of Encounter CODE STATUS discussion, right foot ulcer  Parties in Attendance Patient  Patients Decisional capacity Patient is alert and oriented.  Able to make medical decisions  Discussed regarding right foot ulcer.  Treatment, prognosis.  Answered all questions.  Patient does not have documented healthcare power of attorney.  Wants daughter and son to be healthcare power of attorney's if she is unable to make medical decisions. Discussed regarding CODE STATUS and patient mentions that she would like to be DO NOT RESUSCITATE and DO NOT INTUBATE.  Explained and confirmed.  Orders entered.  Time spent - 17 minutes

## 2018-04-09 NOTE — Plan of Care (Signed)

## 2018-04-09 NOTE — Progress Notes (Signed)
Inpatient Diabetes Program Recommendations  AACE/ADA: New Consensus Statement on Inpatient Glycemic Control (2015)  Target Ranges:  Prepandial:   less than 140 mg/dL      Peak postprandial:   less than 180 mg/dL (1-2 hours)      Critically ill patients:  140 - 180 mg/dL   Lab Results  Component Value Date   GLUCAP 134 (H) 04/09/2018   HGBA1C 11.8 (H) 08/22/2013    Review of Glycemic ControlResults for April [name], April [name] (MRN [id number]) as of 04/09/2018 13:18  Ref. Range 05/17/2017 21:35 04/08/2018 21:27 04/09/2018 00:43 04/09/2018 07:31 04/09/2018 12:28  Glucose-Capillary Latest Ref Range: 70 - 99 mg/dL 409 (H) 811 (H) 914 (H) 117 (H) 134 (H)    Diabetes history: DM Outpatient Diabetes medications: None Current orders for Inpatient glycemic control:  Novolog sensitive tid with meals and HS Inpatient Diabetes Program Recommendations:   Blood sugars currently improved.  Please order A1C to determine prehospitalization glycemic control.  Note that patient was not on medications for DM prior to admit.   Thanks,  Beryl Meager, RN, BC-ADM Inpatient Diabetes Coordinator Pager [phone] (8a-5p)

## 2018-04-10 DIAGNOSIS — E43 Unspecified severe protein-calorie malnutrition: Secondary | ICD-10-CM

## 2018-04-10 LAB — URINALYSIS, ROUTINE W REFLEX MICROSCOPIC
Bilirubin Urine: NEGATIVE
Glucose, UA: >=500 mg/dL — AB
KETONES UR: NEGATIVE mg/dL
Nitrite: NEGATIVE
Protein, ur: 100 mg/dL — AB
Specific Gravity, Urine: 1.024 (ref 1.005–1.030)
WBC, UA: >50 WBC/hpf — ABNORMAL HIGH (ref 0–5)
pH: 5 (ref 5.0–8.0)

## 2018-04-10 LAB — GLUCOSE, CAPILLARY
Glucose-Capillary: 283 mg/dL — ABNORMAL HIGH (ref 70–99)
Glucose-Capillary: 270 mg/dL — ABNORMAL HIGH (ref 70–99)
Glucose-Capillary: 211 mg/dL — ABNORMAL HIGH (ref 70–99)
Glucose-Capillary: 259 mg/dL — ABNORMAL HIGH (ref 70–99)

## 2018-04-10 LAB — CREATININE, SERUM: CREATININE: 0.9 mg/dL (ref 0.44–1.00)

## 2018-04-10 LAB — HIV ANTIBODY (ROUTINE TESTING W REFLEX): HIV SCREEN 4TH GENERATION: NONREACTIVE

## 2018-04-10 MED ORDER — IPRATROPIUM-ALBUTEROL 0.5-2.5 (3) MG/3ML IN SOLN
3.0000 mL | Freq: Four times a day (QID) | RESPIRATORY_TRACT | Status: AC
  Administered 2018-04-10 – 2018-04-11 (×4): 3 mL via RESPIRATORY_TRACT
  Filled 2018-04-10 (×6): qty 3

## 2018-04-10 MED ORDER — GUAIFENESIN ER 600 MG PO TB12
600.0000 mg | ORAL_TABLET | Freq: Two times a day (BID) | ORAL | Status: DC
  Administered 2018-04-10 – 2018-04-11 (×3): 600 mg via ORAL
  Filled 2018-04-10 (×3): qty 1

## 2018-04-10 MED ORDER — INSULIN DETEMIR 100 UNIT/ML ~~LOC~~ SOLN
6.0000 [IU] | Freq: Every day | SUBCUTANEOUS | Status: DC
  Administered 2018-04-10 – 2018-04-11 (×2): 6 [IU] via SUBCUTANEOUS
  Filled 2018-04-10 (×2): qty 0.06

## 2018-04-10 NOTE — Progress Notes (Signed)
Inpatient Diabetes Program Recommendations  AACE/ADA: New Consensus Statement on Inpatient Glycemic Control (2019)  Target Ranges:  Prepandial:   less than 140 mg/dL      Peak postprandial:   less than 180 mg/dL (1-2 hours)      Critically ill patients:  140 - 180 mg/dL  Results for April [name], April [name] (MRN [id number]) as of 04/10/2018 12:34  Ref. Range 04/09/2018 07:31 04/09/2018 12:28 04/09/2018 16:49 04/09/2018 21:18 04/10/2018 07:52 04/10/2018 11:33  Glucose-Capillary Latest Ref Range: 70 - 99 mg/dL 409 (H) 811 (H) 914 (H) 124 (H) 283 (H) 270 (H)   Results for April, [name] (MRN [id number]) as of 04/10/2018 12:34  Ref. Range 04/09/2013 02:06 08/22/2013 05:39 04/09/2018 00:52  Hemoglobin A1C Latest Ref Range: 4.8 - 5.6 % 12.6 (H) 11.8 (H) 10.3 (H)   Review of Glycemic Control  Diabetes history: DM2 Outpatient Diabetes medications: None; prescribed Levemir and Humalog Current orders for Inpatient glycemic control: Novolog 0-9 units TID with meals, Novolog 0-5 units QHS  Inpatient Diabetes Program Recommendations: Insulin - Basal: Please consider ordering Levemir 6 units Q24H starting now. HgbA1C: A1C 10.3% on 04/09/18 indicating an average glucose of 249 mg/dl over the past 2-3 months. Patient has NOT been taking insulin at home. Patient will need to take DM medications as prescribed, check glucose at home consistently, and follow up with PCP regarding DM control.  NOTE: In reviewing Care Everywhere, noted patient was inpatient at [facility] 10/15/17 to 10/24/17 and she was discharged on Levemir 15 units QHS and Humalog 5 units TID with meals. Also noted patient was re-educated by diabetes educators while she was inpatient as well. Spoke with patient about diabetes and home regimen for diabetes control. Patient reports that she is followed by PCP for diabetes management and she states that she has NOT been taking anything for DM lately at home. Patient reports that she is prescribed Levemir and  Humalog insulin but she is not taking it "on account of needles, I don't like them."  Patient reports that she has Levemir and Humalog insulin pens at home along with insulin pen needles, and all testing supplies for glucose monitoring. Patient states that she checks her glucose sometimes at home and it is "high" and notes that she has anxiety and it causes her glucose to go up.  Informed patient that initial glucose was 404 mg/dl when she presented to the hospital and her current A1C is 10.3% on 04/09/18 indicating an average glucose of 249 mg/dl.  Discussed glucose and A1C goals. Discussed importance of checking CBGs and maintaining good CBG control to prevent long-term and short-term complications. Explained how hyperglycemia leads to damage within blood vessels which lead to the common complications seen with uncontrolled diabetes. Stressed to the patient the importance of improving glycemic control to prevent further complications from uncontrolled diabetes. Discussed Levemir and Humalog insulin and how they work. Explained that she needs to take insulin consistently to keep DM controlled. Patient reports that she is able to do everything herself for DM control. Had patient demonstrate how to use an insulin pen. Noted she did not do 2 unit prime or hold for 10 seconds once insulin is delivered with insulin pen. Re-educated on proper insulin pen use and had patient demonstrate insulin pen use which she successfully able to do.  Patient states that she will start consistently checking glucose and taking insulin as prescribed. Encouraged patient to follow up with PCP regarding DM control.  Patient verbalized understanding of information discussed  and she states that she has no further questions at this time related to diabetes.  Thanks, Orlando Penner, RN, MSN, CDE Diabetes Coordinator Inpatient Diabetes Program [phone] (Team Pager)

## 2018-04-10 NOTE — Progress Notes (Signed)
[facility] [place] [facility]   PATIENT NAME: April [name]    MR#:  [id number]  DATE OF BIRTH:  1955/10/13  SUBJECTIVE:  CHIEF COMPLAINT:   Chief Complaint  Patient presents with  . Foot Pain   Pain well controlled.  REVIEW OF SYSTEMS:    Review of Systems  Constitutional: Positive for malaise/fatigue. Negative for chills and fever.  HENT: Negative for sore throat.   Eyes: Negative for blurred vision, double vision and pain.  Respiratory: Negative for cough, hemoptysis, shortness of breath and wheezing.   Cardiovascular: Negative for chest pain, palpitations, orthopnea and leg swelling.  Gastrointestinal: Negative for abdominal pain, constipation, diarrhea, heartburn, nausea and vomiting.  Genitourinary: Negative for dysuria and hematuria.  Musculoskeletal: Positive for joint pain. Negative for back pain.  Skin: Negative for rash.  Neurological: Negative for sensory change, speech change, focal weakness and headaches.  Endo/Heme/Allergies: Does not bruise/bleed easily.  Psychiatric/Behavioral: Negative for depression. The patient is not nervous/anxious.     DRUG ALLERGIES:   Allergies  Allergen Reactions  . Eggs Or Egg-Derived Products Diarrhea, Itching and Nausea And Vomiting    VITALS:  Blood pressure (!) 127/55, pulse 91, temperature 98 F (36.7 C), temperature source Oral, resp. rate 18, height 5\' 2"  (1.575 m), weight 31.8 kg, SpO2 97 %.  PHYSICAL EXAMINATION:   Physical Exam  GENERAL:  62 y.o.-year-old patient lying in the bed with no acute distress.  EYES: Pupils equal, round, reactive to light and accommodation. No scleral icterus. Extraocular muscles intact.  HEENT: Head atraumatic, normocephalic. Oropharynx and nasopharynx clear.  NECK:  Supple, no jugular venous distention. No thyroid enlargement, no tenderness.  LUNGS: Normal breath sounds bilaterally, no wheezing, rales, rhonchi. No use of accessory muscles of respiration.   CARDIOVASCULAR: S1, S2 normal. No murmurs, rubs, or gallops.  ABDOMEN: Soft, nontender, nondistended. Bowel sounds present. No organomegaly or mass.  EXTREMITIES: No cyanosis, clubbing or edema b/l.    NEUROLOGIC: Cranial nerves II through XII are intact. No focal Motor or sensory deficits b/l.   PSYCHIATRIC: The patient is alert and oriented x 3. SKIN: Right foot dressing  LABORATORY PANEL:   CBC Recent Labs  Lab 04/09/18 0052  WBC 6.4  HGB 12.5  HCT 38.9  PLT 219   ------------------------------------------------------------------------------------------------------------------ Chemistries  Recent Labs  Lab 04/08/18 2121 04/09/18 0052 04/10/18 0604  NA 137 140  --   K 4.3 4.3  --   CL 102 104  --   CO2 28 29  --   GLUCOSE 404* 326*  --   BUN 20 19  --   CREATININE 0.87 0.83 0.90  CALCIUM 8.4* 8.7*  --   AST 17  --   --   ALT 29  --   --   ALKPHOS 206*  --   --   BILITOT 0.4  --   --    ------------------------------------------------------------------------------------------------------------------  Cardiac Enzymes No results for input(s): TROPONINI in the last 168 hours. ------------------------------------------------------------------------------------------------------------------  RADIOLOGY:  Dg Foot Complete Right  Result Date: 04/08/2018 CLINICAL DATA:  Swelling to the right foot for 3 days. Blister of the great toe. Amputation a year ago. No injury. EXAM: RIGHT FOOT COMPLETE - 3+ VIEW COMPARISON:  05/17/2017 FINDINGS: Interval amputation of the right fifth ray at the mid metatarsal level since previous study. Focal cortical scalloping along the distal fifth metatarsal stump probably representing postoperative change. Can't entirely exclude sequela of chronic osteomyelitis. Diffuse bone demineralization. No evidence of acute fracture  or dislocation. No definite bone sclerosis or cortical erosion to suggest new osteomyelitis. No expansile or destructive bone  lesions appreciated. There is small amount of soft tissue gas medial to the first metatarsal head likely representing small ulceration. No radiopaque foreign bodies. Vascular calcifications. IMPRESSION: 1. 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 Focal cortical scalloping is probably postoperative. 2. No definite evidence of any new osteomyelitis. 3. Soft tissue gas adjacent to the first metatarsal head may reflect ulceration. Electronically Signed   By: Burman Nieves M.D.   On: 04/08/2018 21:59     ASSESSMENT AND PLAN:   *Right diabetic foot ulcer with surrounding cellulitis.  Status post bedside debridement by podiatry.  On IV antibiotics.  Needs further inpatient antibiotics today Waiting on cultures. Likely discharge on oral antibiotics tomorrow  *Diabetes mellitus.  Check hemoglobin A1c.  Sliding scale insulin.  *Recurrent DVT of left lower extremity.  On anticoagulation.  *COPD with no exacerbation.  Continue inhalers and nebulizers as needed  All the records are reviewed and case discussed with Care Management/Social Worker Management plans discussed with the patient, family and they are in agreement.  CODE STATUS: DNR  DVT Prophylaxis: SCDs  TOTAL TIME TAKING CARE OF THIS PATIENT: 35 minutes.   POSSIBLE D/C IN 1-2 DAYS, DEPENDING ON CLINICAL CONDITION.  Molinda Bailiff Amalya Salmons M.D on 04/10/2018 at 2:20 PM  Between 7am to 6pm - Pager - [phone]  After 6pm go to [email or web address] - password [email or web address]  [facility]  Office  [phone]  CC: Primary care physician; Mickel Fuchs, MD  Note: This dictation was prepared with Dragon dictation along with smaller phrase technology. Any transcriptional errors that result from this process are unintentional.

## 2018-04-10 NOTE — Progress Notes (Signed)
Pt voided small amounts of urine during the night. Bladder Scan 465 volume. In and out cath pt 400 output.

## 2018-04-10 NOTE — Progress Notes (Signed)
Notified MD of pt's frequent voids. Received order to in and out cath

## 2018-04-11 LAB — GLUCOSE, CAPILLARY: Glucose-Capillary: 256 mg/dL — ABNORMAL HIGH (ref 70–99)

## 2018-04-11 MED ORDER — GUAIFENESIN-DM 100-10 MG/5ML PO SYRP: 5.0000 mL | ORAL_SOLUTION | ORAL | 0 refills | Status: AC | PRN

## 2018-04-11 MED ORDER — CEPHALEXIN 250 MG PO CAPS: 250.0000 mg | ORAL_CAPSULE | Freq: Three times a day (TID) | ORAL | 0 refills | Status: AC

## 2018-04-11 NOTE — Progress Notes (Signed)
Inpatient Diabetes Program Recommendations  AACE/ADA: New Consensus Statement on Inpatient Glycemic Control (2019)  Target Ranges:  Prepandial:   less than 140 mg/dL      Peak postprandial:   less than 180 mg/dL (1-2 hours)      Critically ill patients:  140 - 180 mg/dL   Results for April [name], April [name] (MRN [id number]) as of 04/11/2018 08:59  Ref. Range 04/10/2018 07:52 04/10/2018 11:33 04/10/2018 16:58 04/10/2018 21:21 04/11/2018 08:14  Glucose-Capillary Latest Ref Range: 70 - 99 mg/dL 409 (H) 811 (H) 914 (H) 259 (H) 256 (H)   Review of Glycemic Control  Diabetes history: DM2 Outpatient Diabetes medications: None; prescribed Levemir and Humalog Current orders for Inpatient glycemic control: Levemir 6 units daily, Novolog 0-9 units TID with meals, Novolog 0-5 units QHS  Inpatient Diabetes Program Recommendations: Insulin - Basal: Please consider increasing Levemir to 10 units daily. Insulin-Meal Coverage: Please consider ordering Novolog 3 units TID with meals for meal coverage if patient eats at least 50% of meals. HgbA1C: A1C 10.3% on 04/09/18 indicating an average glucose of 249 mg/dl over the past 2-3 months. Patient has NOT been taking insulin at home. Patient will need to take DM medications as prescribed, check glucose at home consistently, and follow up with PCP regarding DM control.  Thanks, Orlando Penner, RN, MSN, CDE Diabetes Coordinator Inpatient Diabetes Program [phone] (Team Pager from 8am to 5pm)

## 2018-04-11 NOTE — Progress Notes (Signed)
[facility]                                                      Patient Demographics  April [name], is a 62 y.o. female   MRN: [id number]   DOB - 10-16-1955  Admit Date - 04/08/2018    Outpatient Primary MD for the patient is Wroth, Sydnee Cabal, MD  Consult requested in the Hospital by Milagros Loll, MD, On 04/11/2018  With History of -  Past Medical History:  Diagnosis Date  . Anxiety   . Colon polyps   . COPD (chronic obstructive pulmonary disease) (HCC)   . Diabetes mellitus without complication (HCC)   . DVT (deep venous thrombosis) (HCC)   . DVT (deep venous thrombosis) (HCC)   . History of colonic polyps   . Hyperlipidemia   . Peripheral artery disease (HCC)   . Pulmonary nodules/lesions, multiple 08/2012   NEGATIVE PET SCAN  . Retroperitoneal abscess (HCC)    history of in 04/2013  . Tobacco abuse       Past Surgical History:  Procedure Laterality Date  . CHOLECYSTECTOMY    . COLONOSCOPY  05/2007    in for   Chief Complaint  Patient presents with  . Foot Pain     HPI  April [name]  is a 63 y.o. female, hospitalized for cellulitis and ulcerations to the right foot.  Her history of fifth ray amputation.  Getting home health and is being treated for this at [facility].  Her son states that home health did not come for over a week and this when she got into trouble.    Review of Systems    In addition to the HPI above,  No Fever-chills, No Headache, No changes with Vision or hearing, No problems swallowing food or Liquids, No Chest pain, Cough or Shortness of Breath, No Abdominal pain, No Nausea or Vommitting, Bowel movements are regular, No Blood in stool or Urine, No dysuria, No new skin rashes or bruises, No new joints pains-aches,  No new weakness, tingling, numbness in any extremity, No recent weight gain or loss, No  polyuria, polydypsia or polyphagia, No significant Mental Stressors.  A full 10 point Review of Systems was done, except as stated above, all other Review of Systems were negative.   Social History Social History   Tobacco Use  . Smoking status: Current Every Day Smoker    Packs/day: 1.50    Years: 47.00    Pack years: 70.50    Types: Cigarettes  . Smokeless tobacco: Never Used  . Tobacco comment: Smoking History 2.5-started at age 3  Substance Use Topics  . Alcohol use: No    Alcohol/week: 0.0 standard drinks    Family History Family History  Problem Relation Age of Onset  . Brain cancer Mother     Prior to Admission medications   Medication Sig Start Date End Date Taking? Authorizing Provider  albuterol (PROVENTIL HFA;VENTOLIN HFA) 108 (90 Base) MCG/ACT inhaler Inhale 2 puffs into the lungs every 6 (six) hours as needed for wheezing or shortness of breath.   Yes [provider]  albuterol (PROVENTIL) (2.5 MG/3ML) 0.083% nebulizer solution Inhale 2.5 mg into the lungs every 6 (six) hours as needed for wheezing or shortness of breath.    Yes [provider]  aspirin  81 MG chewable tablet Chew 81 mg by mouth daily. 05/30/17  Yes [provider]  atorvastatin (LIPITOR) 40 MG tablet Take 40 mg by mouth daily. 10/24/17 10/24/18 Yes [provider]  Fluticasone-Salmeterol (ADVAIR DISKUS) 250-50 MCG/DOSE AEPB Inhale 1 puff into the lungs 2 (two) times daily. 03/14/15 01/16/26 Yes Beers, Charmayne Sheer, PA-C  oxyCODONE-acetaminophen (PERCOCET) 10-325 MG tablet Take 1 tablet by mouth every 6 (six) hours as needed for pain.    Yes [provider]  rivaroxaban (XARELTO) 10 MG TABS tablet Take 10 mg by mouth every evening.   Yes [provider]  sodium hypochlorite (DAKIN'S 1/4 STRENGTH) 0.125 % SOLN Apply 1 application topically daily. Use as a wound cleanser, pat dry, then apply dressings 12/18/17  Yes [provider]  cephALEXin  (KEFLEX) 250 MG capsule Take 1 capsule (250 mg total) by mouth 3 (three) times daily for 7 days. 04/11/18 04/18/18  Milagros Loll, MD  guaiFENesin-dextromethorphan (ROBITUSSIN DM) 100-10 MG/5ML syrup Take 5 mLs by mouth every 4 (four) hours as needed for cough. 04/11/18   Milagros Loll, MD  ibuprofen (ADVIL,MOTRIN) 600 MG tablet Take 1 tablet (600 mg total) by mouth every 8 (eight) hours as needed. Patient not taking: Reported on 01/16/2017 06/05/16   Governor Rooks, MD    Anti-infectives (From admission, onward)   Start     Dose/Rate Route Frequency Ordered Stop   04/11/18 0000  cephALEXin (KEFLEX) 250 MG capsule     250 mg Oral 3 times daily 04/11/18 0953 04/18/18 2359   04/10/18 0700  vancomycin (VANCOCIN) 500 mg in sodium chloride 0.9 % 100 mL IVPB     500 mg 100 mL/hr over 60 Minutes Intravenous Every 24 hours 04/09/18 0116     04/09/18 0030  ceFEPIme (MAXIPIME) 1 g in sodium chloride 0.9 % 100 mL IVPB  Status:  Discontinued     1 g 200 mL/hr over 30 Minutes Intravenous Every 12 hours 04/09/18 0017 04/10/18 1526   04/08/18 2245  vancomycin (VANCOCIN) IVPB 1000 mg/200 mL premix     1,000 mg 200 mL/hr over 60 Minutes Intravenous  Once 04/08/18 2238 04/09/18 0206   04/08/18 2245  piperacillin-tazobactam (ZOSYN) IVPB 3.375 g  Status:  Discontinued     3.375 g 100 mL/hr over 30 Minutes Intravenous  Once 04/08/18 2238 04/09/18 0018      Scheduled Meds: . aspirin  81 mg Oral Daily  . atorvastatin  40 mg Oral Daily  . collagenase   Topical Daily  . guaiFENesin  600 mg Oral BID  . insulin aspart  0-5 Units Subcutaneous QHS  . insulin aspart  0-9 Units Subcutaneous TID WC  . insulin detemir  6 Units Subcutaneous Daily  . mometasone-formoterol  2 puff Inhalation BID  . multivitamin-lutein  1 capsule Oral Daily  . protein supplement shake  11 oz Oral BID BM  . rivaroxaban  10 mg Oral QPM  . vitamin C  250 mg Oral Daily   Continuous Infusions: . vancomycin 500 mg (04/11/18 0555)    PRN Meds:.acetaminophen **OR** acetaminophen, guaiFENesin-dextromethorphan, morphine injection, ondansetron **OR** ondansetron (ZOFRAN) IV, oxyCODONE  Allergies  Allergen Reactions  . Eggs Or Egg-Derived Products Diarrhea, Itching and Nausea And Vomiting    Physical Exam she is alert well oriented and pleasant.  Vitals  Blood pressure 127/70, pulse 96, temperature 97.8 F (36.6 C), temperature source Oral, resp. rate 18, height 5\' 2"  (1.575 m), weight 31.8 kg, SpO2 98 %.  Lower Extremity exam: The  wound is significantly improved redness cellulitis is better.  Few areas of dead skin that I will clean up a little bit today but nothing particular problematic.  Cultures came back as a staph aureus susceptible to most appropriate antibiotics.  Not MRSA.  She has a boot and also from [facility] to offload the areas of problems 4.  Data Review  CBC Recent Labs  Lab 04/08/18 2121 04/09/18 0052  WBC 7.2 6.4  HGB 13.2 12.5  HCT 41.3 38.9  PLT 228 219  MCV 97.4 96.8  MCH 31.1 31.1  MCHC 32.0 32.1  RDW 14.7 14.6  LYMPHSABS 1.2  --   MONOABS 0.5  --   EOSABS 0.2  --   BASOSABS 0.0  --    ------------------------------------------------------------------------------------------------------------------  Chemistries  Recent Labs  Lab 04/08/18 2121 04/09/18 0052 04/10/18 0604  NA 137 140  --   K 4.3 4.3  --   CL 102 104  --   CO2 28 29  --   GLUCOSE 404* 326*  --   BUN 20 19  --   CREATININE 0.87 0.83 0.90  CALCIUM 8.4* 8.7*  --   AST 17  --   --   ALT 29  --   --   ALKPHOS 206*  --   --   BILITOT 0.4  --   --    ------------------------------------------------------------------------------------------------------------------ estimated creatinine clearance is 32.1 mL/min (by C-G formula based on SCr of 0.9 mg/dL). ------------------------------------------------------------------------------------------------------------------ No results for input(s): TSH, T4TOTAL,  T3FREE, THYROIDAB in the last 72 hours.  Invalid input(s): FREET3 Urinalysis    Component Value Date/Time   COLORURINE YELLOW (A) 04/10/2018 1208   APPEARANCEUR TURBID (A) 04/10/2018 1208   APPEARANCEUR Clear 10/13/2014 1412   LABSPEC 1.024 04/10/2018 1208   LABSPEC 1.031 10/13/2014 1412   PHURINE 5.0 04/10/2018 1208   GLUCOSEU >=500 (A) 04/10/2018 1208   GLUCOSEU >=500 10/13/2014 1412   HGBUR LARGE (A) 04/10/2018 1208   BILIRUBINUR NEGATIVE 04/10/2018 1208   BILIRUBINUR Negative 10/13/2014 1412   KETONESUR NEGATIVE 04/10/2018 1208   PROTEINUR 100 (A) 04/10/2018 1208   NITRITE NEGATIVE 04/10/2018 1208   LEUKOCYTESUR MODERATE (A) 04/10/2018 1208   LEUKOCYTESUR 1+ 10/13/2014 1412     Imaging results:   No results found.  Assessment & Plan: Overall think she is stable with her foot she probably switch to an oral antibiotic that will hit the staph aureus at this point.  Still needs daily wet-to-dry dressings with lots and lots of gauze padding Kerlix wrap and protection across the region with limited ambulation weightbearing.  She does have a boot so she should use that whenever she ambulates her as her foot on the floor.  States she also has an appointment at [facility] on October 18 and I encouraged her to follow-up with out and keep it.  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

## 2018-04-11 NOTE — Care Management Important Message (Signed)
Copy of signed IM left with patient in room.  

## 2018-04-11 NOTE — Progress Notes (Signed)
Patient is being discharged home. IV removed with cath intact. Dressing changed by MD this shift. Discussed discharge instructions along with meds, scripts, and last dose given. Boyfriend in room during discharge instructions. Allowed time for questions.

## 2018-04-11 NOTE — Discharge Instructions (Signed)
Apply a layer of gauze over each wound dorsally laterally and medially on the right foot lightly wet with saline and then increase the gauze padding to all areas and wrapped with Kerlix.

## 2018-04-11 NOTE — Discharge Summary (Signed)
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 Anxiety   . Colon polyps   . COPD (chronic obstructive pulmonary disease) (HCC)   . Diabetes mellitus without complication (HCC)   . DVT (deep venous thrombosis) (HCC)   . DVT (deep venous thrombosis) (HCC)   . History of colonic polyps   . Hyperlipidemia   . Peripheral artery disease (HCC)   . Pulmonary nodules/lesions, multiple 08/2012   NEGATIVE PET SCAN  . Retroperitoneal abscess (HCC)    history of in 04/2013  . Tobacco abuse      ADMITTING HISTORY  HISTORY OF PRESENT ILLNESS:  April [name]  is a 62 y.o. female who presents with chief complaint as above.  Patient presents with right foot ulcers.  She has 2 chronic ulcers on the dorsum and lateral aspect of her feet that are partially healed.  She has a new blistering early ulceration just proximal to the base of her great toe.  She has some surrounding cellulitis.  She is a diabetic.  Hospitalist were called for admission   HOSPITAL COURSE:   *Right diabetic foot ulcer with surrounding cellulitis.   Status post bedside debridement by podiatry.   On IV antibiotics in the hospital.   Treated with broad-spectrum antibiotics in the hospital Cultures with staph aureus.  Discharged home on  Keflex.  * Diabetes mellitus.   Patient was not taking her home insulin.  Counseled to resume home dose.  Patient understands.  *Recurrent DVT of left lower extremity. On anticoagulation.  *COPD with no exacerbation.  Continued inhalers and nebulizers as needed  Stable for discharge home  CONSULTS OBTAINED:  Treatment Team:  Recardo Evangelist, DPM  DRUG ALLERGIES:   Allergies  Allergen Reactions  . Eggs Or Egg-Derived Products Diarrhea, Itching and Nausea And Vomiting    DISCHARGE MEDICATIONS:   Allergies as of 04/11/2018      Reactions   Eggs Or Egg-derived Products Diarrhea, Itching, Nausea And Vomiting      Medication List    STOP taking these medications   ibuprofen 600 MG tablet Commonly known as:  ADVIL,MOTRIN     TAKE these medications   albuterol (2.5 MG/3ML) 0.083% nebulizer solution Commonly known as:  PROVENTIL Inhale 2.5 mg into the lungs every 6 (six) hours as needed for wheezing or shortness of breath.   albuterol 108 (90 Base) MCG/ACT inhaler Commonly known as:  PROVENTIL HFA;VENTOLIN HFA Inhale 2 puffs into the lungs every 6 (six) hours as needed for wheezing or shortness of breath.   aspirin 81 MG chewable tablet Chew 81 mg by mouth daily.   atorvastatin 40 MG tablet Commonly known as:  LIPITOR Take 40 mg by mouth daily.   cephALEXin 250 MG capsule Commonly known as:  KEFLEX Take 1 capsule (250 mg total) by mouth  3 (three) times daily for 7 days.   Fluticasone-Salmeterol 250-50 MCG/DOSE Aepb Commonly known as:  ADVAIR Inhale 1 puff into the lungs 2 (two) times daily.   guaiFENesin-dextromethorphan 100-10 MG/5ML syrup Commonly known as:  ROBITUSSIN DM Take 5 mLs by mouth every 4 (four) hours as needed for cough.   oxyCODONE-acetaminophen 10-325 MG tablet Commonly known as:  PERCOCET Take 1 tablet by mouth every 6 (six) hours as needed for pain.   sodium hypochlorite 0.125 % Soln Commonly known as:  DAKIN'S 1/4 STRENGTH Apply 1  application topically daily. Use as a wound cleanser, pat dry, then apply dressings   XARELTO 10 MG Tabs tablet Generic drug:  rivaroxaban Take 10 mg by mouth every evening.       Today   VITAL SIGNS:  Blood pressure 124/76, pulse 90, temperature 98.5 F (36.9 C), temperature source Oral, resp. rate 16, height 5\' 2"  (1.575 m), weight 31.8 kg, SpO2 97 %.  I/O:    Intake/Output Summary (Last 24 hours) at 04/11/2018 1441 Last data filed at 04/11/2018 0956 Gross per 24 hour  Intake 780 ml  Output -  Net 780 ml    PHYSICAL EXAMINATION:  Physical Exam  GENERAL:  62 y.o.-year-old patient lying in the bed with no acute distress.  LUNGS: Normal breath sounds bilaterally, no wheezing, rales,rhonchi or crepitation. No use of accessory muscles of respiration.  CARDIOVASCULAR: S1, S2 normal. No murmurs, rubs, or gallops.  ABDOMEN: Soft, non-tender, non-distended. Bowel sounds present. No organomegaly or mass.  NEUROLOGIC: Moves all 4 extremities. PSYCHIATRIC: The patient is alert and oriented x 3.  SKIN: Foot dressing in place  DATA REVIEW:   CBC Recent Labs  Lab 04/09/18 0052  WBC 6.4  HGB 12.5  HCT 38.9  PLT 219    Chemistries  Recent Labs  Lab 04/08/18 2121 04/09/18 0052 04/10/18 0604  NA 137 140  --   K 4.3 4.3  --   CL 102 104  --   CO2 28 29  --   GLUCOSE 404* 326*  --   BUN 20 19  --   CREATININE 0.87 0.83 0.90  CALCIUM 8.4* 8.7*  --   AST 17  --   --   ALT 29  --   --   ALKPHOS 206*  --   --   BILITOT 0.4  --   --     Cardiac Enzymes No results for input(s): TROPONINI in the last 168 hours.  Microbiology Results  Results for orders placed or performed during the hospital encounter of 04/08/18  Aerobic/Anaerobic Culture (surgical/deep wound)     Status: None (Preliminary result)   Collection Time: 04/08/18 10:33 PM  Result Value Ref Range Status   Specimen Description   Final    WOUND FOOT RIGHT Performed at [facility], [address], [place], [place] [address]    Special Requests   Final    Immunocompromised Performed at [facility], [address] [place], [place] [address]    Gram Stain   Final    RARE WBC PRESENT,BOTH PMN AND MONONUCLEAR FEW GRAM POSITIVE COCCI Performed at [facility], [address] [place], [place] [address]    Culture   Final    MODERATE STAPHYLOCOCCUS AUREUS NO ANAEROBES ISOLATED; CULTURE IN PROGRESS FOR 5 DAYS    Report Status PENDING  Incomplete   Organism ID, Bacteria STAPHYLOCOCCUS AUREUS  Final      Susceptibility   Staphylococcus  aureus - MIC*    CIPROFLOXACIN <=0.5 SENSITIVE Sensitive     ERYTHROMYCIN 0.5 SENSITIVE Sensitive     GENTAMICIN <=0.5 SENSITIVE Sensitive     OXACILLIN 0.5 SENSITIVE Sensitive     TETRACYCLINE <=1 SENSITIVE Sensitive     VANCOMYCIN <=0.5 SENSITIVE Sensitive     TRIMETH/SULFA <=10 SENSITIVE Sensitive     CLINDAMYCIN <=0.25 SENSITIVE Sensitive     RIFAMPIN <=0.5 SENSITIVE Sensitive     Inducible Clindamycin NEGATIVE Sensitive     * MODERATE STAPHYLOCOCCUS AUREUS    RADIOLOGY:  No results found.  Follow up with PCP in 1 week.  Management plans discussed with the patient, family and they are in agreement.  CODE STATUS:     Code Status Orders  (From admission, onward)         Start     Ordered   04/10/18 1112  Do not attempt resuscitation (DNR)  Continuous    Question Answer Comment  In the event of cardiac or respiratory ARREST Do not call a "code blue"   In the event of cardiac or respiratory ARREST Do not perform Intubation, CPR, defibrillation or ACLS   In the event of cardiac or respiratory ARREST Use medication by any route, position, wound care, and other measures to relive pain and suffering. May use oxygen, suction and manual treatment of airway obstruction as needed for comfort.      04/10/18 1111        Code Status History    Date Active Date Inactive Code Status Order ID Comments User Context   04/09/2018  1519 04/10/2018 1111 Full Code [id number]  Milagros Loll, MD Inpatient   04/09/2018 1337 04/09/2018 1519 DNR [id number]  Milagros Loll, MD Inpatient   04/09/2018 0012 04/09/2018 1337 Full Code [id number]  Oralia Manis, MD ED      TOTAL TIME TAKING CARE OF THIS PATIENT ON DAY OF DISCHARGE: more than 30 minutes.   Molinda Bailiff Juanell Saffo M.D on 04/11/2018 at 2:41 PM  Between 7am to 6pm - Pager - [phone]  After 6pm go to [email or web address] - password [email or web address]  [facility]  Office  [phone]  CC: Primary care physician; Mickel Fuchs, MD  Note: This dictation was prepared with Dragon dictation along with smaller phrase technology. Any transcriptional errors that result from this process are unintentional.

## 2018-04-14 LAB — AEROBIC/ANAEROBIC CULTURE W GRAM STAIN (SURGICAL/DEEP WOUND)

## 2018-04-14 LAB — AEROBIC/ANAEROBIC CULTURE (SURGICAL/DEEP WOUND)

## 2018-05-21 ENCOUNTER — Other Ambulatory Visit (INDEPENDENT_AMBULATORY_CARE_PROVIDER_SITE_OTHER): Payer: Self-pay | Admitting: Podiatry

## 2018-05-21 ENCOUNTER — Encounter (INDEPENDENT_AMBULATORY_CARE_PROVIDER_SITE_OTHER): Payer: Medicare HMO | Admitting: Nurse Practitioner

## 2018-05-21 ENCOUNTER — Encounter (INDEPENDENT_AMBULATORY_CARE_PROVIDER_SITE_OTHER): Payer: Medicare HMO

## 2018-05-21 DIAGNOSIS — I739 Peripheral vascular disease, unspecified: Secondary | ICD-10-CM

## 2018-05-21 DIAGNOSIS — L97512 Non-pressure chronic ulcer of other part of right foot with fat layer exposed: Secondary | ICD-10-CM

## 2018-05-21 DIAGNOSIS — L97501 Non-pressure chronic ulcer of other part of unspecified foot limited to breakdown of skin: Secondary | ICD-10-CM

## 2018-05-21 DIAGNOSIS — L97511 Non-pressure chronic ulcer of other part of right foot limited to breakdown of skin: Secondary | ICD-10-CM

## 2018-05-22 ENCOUNTER — Other Ambulatory Visit: Payer: Self-pay

## 2018-05-22 ENCOUNTER — Inpatient Hospital Stay
Admission: EM | Admit: 2018-05-22 | Discharge: 2018-06-01 | DRG: 853 | Disposition: A | Discharge status: Home Health | Payer: Medicare HMO | Attending: Internal Medicine | Admitting: Internal Medicine

## 2018-05-22 ENCOUNTER — Encounter: Payer: Self-pay | Admitting: Emergency Medicine

## 2018-05-22 ENCOUNTER — Emergency Department: Payer: Medicare HMO

## 2018-05-22 DIAGNOSIS — B850 Pediculosis due to Pediculus humanus capitis: Secondary | ICD-10-CM | POA: Diagnosis present

## 2018-05-22 DIAGNOSIS — E1165 Type 2 diabetes mellitus with hyperglycemia: Secondary | ICD-10-CM | POA: Diagnosis present

## 2018-05-22 DIAGNOSIS — Z7951 Long term (current) use of inhaled steroids: Secondary | ICD-10-CM

## 2018-05-22 DIAGNOSIS — A419 Sepsis, unspecified organism: Principal | ICD-10-CM | POA: Diagnosis present

## 2018-05-22 DIAGNOSIS — J9601 Acute respiratory failure with hypoxia: Secondary | ICD-10-CM

## 2018-05-22 DIAGNOSIS — Z79899 Other long term (current) drug therapy: Secondary | ICD-10-CM

## 2018-05-22 DIAGNOSIS — B9562 Methicillin resistant Staphylococcus aureus infection as the cause of diseases classified elsewhere: Secondary | ICD-10-CM | POA: Diagnosis not present

## 2018-05-22 DIAGNOSIS — E869 Volume depletion, unspecified: Secondary | ICD-10-CM | POA: Diagnosis not present

## 2018-05-22 DIAGNOSIS — E1152 Type 2 diabetes mellitus with diabetic peripheral angiopathy with gangrene: Secondary | ICD-10-CM | POA: Diagnosis present

## 2018-05-22 DIAGNOSIS — L89620 Pressure ulcer of left heel, unstageable: Secondary | ICD-10-CM | POA: Diagnosis present

## 2018-05-22 DIAGNOSIS — N179 Acute kidney failure, unspecified: Secondary | ICD-10-CM | POA: Diagnosis not present

## 2018-05-22 DIAGNOSIS — E43 Unspecified severe protein-calorie malnutrition: Secondary | ICD-10-CM | POA: Diagnosis present

## 2018-05-22 DIAGNOSIS — Z9049 Acquired absence of other specified parts of digestive tract: Secondary | ICD-10-CM

## 2018-05-22 DIAGNOSIS — F17210 Nicotine dependence, cigarettes, uncomplicated: Secondary | ICD-10-CM | POA: Diagnosis present

## 2018-05-22 DIAGNOSIS — I96 Gangrene, not elsewhere classified: Secondary | ICD-10-CM | POA: Diagnosis present

## 2018-05-22 DIAGNOSIS — L97519 Non-pressure chronic ulcer of other part of right foot with unspecified severity: Secondary | ICD-10-CM | POA: Diagnosis present

## 2018-05-22 DIAGNOSIS — J441 Chronic obstructive pulmonary disease with (acute) exacerbation: Secondary | ICD-10-CM | POA: Diagnosis not present

## 2018-05-22 DIAGNOSIS — R0602 Shortness of breath: Secondary | ICD-10-CM

## 2018-05-22 DIAGNOSIS — Z794 Long term (current) use of insulin: Secondary | ICD-10-CM

## 2018-05-22 DIAGNOSIS — Z86718 Personal history of other venous thrombosis and embolism: Secondary | ICD-10-CM

## 2018-05-22 DIAGNOSIS — E8770 Fluid overload, unspecified: Secondary | ICD-10-CM | POA: Diagnosis not present

## 2018-05-22 DIAGNOSIS — Z681 Body mass index (BMI) 19 or less, adult: Secondary | ICD-10-CM

## 2018-05-22 DIAGNOSIS — Z91012 Allergy to eggs: Secondary | ICD-10-CM

## 2018-05-22 DIAGNOSIS — Z7982 Long term (current) use of aspirin: Secondary | ICD-10-CM

## 2018-05-22 DIAGNOSIS — R310 Gross hematuria: Secondary | ICD-10-CM | POA: Diagnosis not present

## 2018-05-22 DIAGNOSIS — J189 Pneumonia, unspecified organism: Secondary | ICD-10-CM

## 2018-05-22 DIAGNOSIS — Z9114 Patient's other noncompliance with medication regimen: Secondary | ICD-10-CM

## 2018-05-22 DIAGNOSIS — E785 Hyperlipidemia, unspecified: Secondary | ICD-10-CM | POA: Diagnosis present

## 2018-05-22 DIAGNOSIS — M86171 Other acute osteomyelitis, right ankle and foot: Secondary | ICD-10-CM | POA: Diagnosis present

## 2018-05-22 DIAGNOSIS — L03115 Cellulitis of right lower limb: Secondary | ICD-10-CM | POA: Diagnosis present

## 2018-05-22 DIAGNOSIS — E1169 Type 2 diabetes mellitus with other specified complication: Secondary | ICD-10-CM | POA: Diagnosis present

## 2018-05-22 DIAGNOSIS — I70202 Unspecified atherosclerosis of native arteries of extremities, left leg: Secondary | ICD-10-CM | POA: Diagnosis present

## 2018-05-22 DIAGNOSIS — D638 Anemia in other chronic diseases classified elsewhere: Secondary | ICD-10-CM | POA: Diagnosis present

## 2018-05-22 DIAGNOSIS — N390 Urinary tract infection, site not specified: Secondary | ICD-10-CM | POA: Diagnosis not present

## 2018-05-22 DIAGNOSIS — J810 Acute pulmonary edema: Secondary | ICD-10-CM | POA: Diagnosis not present

## 2018-05-22 DIAGNOSIS — J9811 Atelectasis: Secondary | ICD-10-CM | POA: Diagnosis not present

## 2018-05-22 DIAGNOSIS — Z7901 Long term (current) use of anticoagulants: Secondary | ICD-10-CM

## 2018-05-22 DIAGNOSIS — E11621 Type 2 diabetes mellitus with foot ulcer: Secondary | ICD-10-CM | POA: Diagnosis present

## 2018-05-22 DIAGNOSIS — H9190 Unspecified hearing loss, unspecified ear: Secondary | ICD-10-CM | POA: Diagnosis present

## 2018-05-22 DIAGNOSIS — Z86010 Personal history of colonic polyps: Secondary | ICD-10-CM

## 2018-05-22 DIAGNOSIS — J9602 Acute respiratory failure with hypercapnia: Secondary | ICD-10-CM | POA: Diagnosis not present

## 2018-05-22 LAB — COMPREHENSIVE METABOLIC PANEL
ALBUMIN: 2.7 g/dL — AB (ref 3.5–5.0)
ALT: 10 U/L (ref 0–44)
AST: 15 U/L (ref 15–41)
Alkaline Phosphatase: 108 U/L (ref 38–126)
Anion gap: 5 (ref 5–15)
BUN: 20 mg/dL (ref 8–23)
CHLORIDE: 95 mmol/L — AB (ref 98–111)
CO2: 32 mmol/L (ref 22–32)
CREATININE: 0.94 mg/dL (ref 0.44–1.00)
Calcium: 8.3 mg/dL — ABNORMAL LOW (ref 8.9–10.3)
GFR calc Af Amer: >60 mL/min (ref >60)
GFR calc non Af Amer: >60 mL/min (ref >60)
Glucose, Bld: 513 mg/dL (ref 70–99)
Potassium: 5.3 mmol/L — ABNORMAL HIGH (ref 3.5–5.1)
SODIUM: 132 mmol/L — AB (ref 135–145)
Total Bilirubin: 0.3 mg/dL (ref 0.3–1.2)
Total Protein: 7 g/dL (ref 6.5–8.1)

## 2018-05-22 LAB — CBC WITH DIFFERENTIAL/PLATELET
ABS IMMATURE GRANULOCYTES: 0.08 10*3/uL — AB (ref 0.00–0.07)
BASOS ABS: 0.1 10*3/uL (ref 0.0–0.1)
Basophils Relative: 0 %
Eosinophils Absolute: 0.1 10*3/uL (ref 0.0–0.5)
Eosinophils Relative: 1 %
HEMATOCRIT: 34.7 % — AB (ref 36.0–46.0)
HEMOGLOBIN: 11.1 g/dL — AB (ref 12.0–15.0)
Immature Granulocytes: 1 %
LYMPHS ABS: 1.7 10*3/uL (ref 0.7–4.0)
LYMPHS PCT: 11 %
MCH: 30.2 pg (ref 26.0–34.0)
MCHC: 32 g/dL (ref 30.0–36.0)
MCV: 94.6 fL (ref 80.0–100.0)
Monocytes Absolute: 0.6 10*3/uL (ref 0.1–1.0)
Monocytes Relative: 4 %
NEUTROS ABS: 13.1 10*3/uL — AB (ref 1.7–7.7)
NRBC: 0 % (ref 0.0–0.2)
Neutrophils Relative %: 83 %
Platelets: 351 10*3/uL (ref 150–400)
RBC: 3.67 MIL/uL — AB (ref 3.87–5.11)
RDW: 12.4 % (ref 11.5–15.5)
WBC: 15.6 10*3/uL — AB (ref 4.0–10.5)

## 2018-05-22 LAB — LACTIC ACID, PLASMA: LACTIC ACID, VENOUS: 2.5 mmol/L — AB (ref 0.5–1.9)

## 2018-05-22 MED ORDER — SODIUM CHLORIDE 0.9 % IV BOLUS
1000.0000 mL | Freq: Once | INTRAVENOUS | Status: AC
  Administered 2018-05-22: 1000 mL via INTRAVENOUS

## 2018-05-22 MED ORDER — INSULIN GLARGINE 100 UNIT/ML ~~LOC~~ SOLN
20.0000 [IU] | Freq: Once | SUBCUTANEOUS | Status: AC
  Administered 2018-05-23: 20 [IU] via SUBCUTANEOUS
  Filled 2018-05-22: qty 0.2

## 2018-05-22 MED ORDER — VANCOMYCIN HCL IN DEXTROSE 1-5 GM/200ML-% IV SOLN
1000.0000 mg | Freq: Once | INTRAVENOUS | Status: AC
  Administered 2018-05-23: 1000 mg via INTRAVENOUS
  Filled 2018-05-22: qty 200

## 2018-05-22 MED ORDER — ONDANSETRON HCL 4 MG/2ML IJ SOLN
4.0000 mg | Freq: Once | INTRAMUSCULAR | Status: AC
  Administered 2018-05-22: 4 mg via INTRAVENOUS
  Filled 2018-05-22: qty 2

## 2018-05-22 MED ORDER — MORPHINE SULFATE (PF) 4 MG/ML IV SOLN
4.0000 mg | Freq: Once | INTRAVENOUS | Status: AC
  Administered 2018-05-22: 4 mg via INTRAVENOUS
  Filled 2018-05-22: qty 1

## 2018-05-22 MED ORDER — SODIUM CHLORIDE 0.9 % IV SOLN
2.0000 g | INTRAVENOUS | Status: DC
  Administered 2018-05-22: 2 g via INTRAVENOUS
  Filled 2018-05-22: qty 20

## 2018-05-22 NOTE — ED Provider Notes (Signed)
[facility] Emergency Department Provider Note  ____________________________________________   First MD Initiated Contact with Patient 05/22/18 2306     (approximate)  I have reviewed the triage vital signs and the nursing notes.   HISTORY  Chief Complaint Wound Check   HPI April [name] is a 62 y.o. female sent to the emergency department by her home health nurse for evaluation of an infection in her right foot.  She has a long-standing history of poorly controlled diabetes mellitus and has had a chronic wound on her right foot for "a long time".  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  She also has a history of DVT and is currently anticoagulated with rivaroxaban.  She gets dressing changes performed every week by the home health nurse and changes the dressing on her own daily in between.  When the nurse came today she noted a large amount of swelling in the right foot and said the wound appeared unhealthy and recommended emergency evaluation.  The patient says that the swelling has only been present for 24 hours.  It began gradually although has been rapidly progressive.  She denies fevers or chills.  She denies chest pain or shortness of breath.  The symptoms are somewhat worse when touching and improved when elevating her foot.    Past Medical History:  Diagnosis Date  . Anxiety   . Colon polyps   . COPD (chronic obstructive pulmonary disease) (HCC)   . Diabetes mellitus without complication (HCC)   . DVT (deep venous thrombosis) (HCC)   . DVT (deep venous thrombosis) (HCC)   . History of colonic polyps   . Hyperlipidemia   . Peripheral artery disease (HCC)   . Pulmonary nodules/lesions, multiple 08/2012   NEGATIVE PET SCAN  . Retroperitoneal abscess (HCC)    history of in 04/2013  . Tobacco abuse     Patient Active Problem List   Diagnosis Date Noted  . Acute osteomyelitis of right foot (HCC) 05/23/2018  .  Protein-calorie malnutrition, severe 04/10/2018  . Diabetes (HCC) 04/08/2018  . COPD (chronic obstructive pulmonary disease) (HCC) 04/08/2018  . HLD (hyperlipidemia) 04/08/2018  . Diabetic foot ulcer (HCC) 04/08/2018  . 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 Elevated CEA 01/30/2014    Past Surgical History:  Procedure Laterality Date  . CHOLECYSTECTOMY    . COLONOSCOPY  05/2007    Prior to Admission medications   Medication Sig Start Date End Date Taking? Authorizing Provider  aspirin 81 MG chewable tablet Chew 81 mg by mouth daily. 05/30/17  Yes [provider]  atorvastatin (LIPITOR) 40 MG tablet Take 40 mg by mouth daily. 10/24/17 10/24/18 Yes [provider]  Fluticasone-Salmeterol (ADVAIR DISKUS) 250-50 MCG/DOSE AEPB Inhale 1 puff into the lungs 2 (two) times daily. 03/14/15 01/16/26 Yes Beers, Charmayne Sheer, PA-C  pantoprazole (PROTONIX) 40 MG tablet Take 40 mg by mouth 2 (two) times daily.  04/25/18  Yes [provider]  rivaroxaban (XARELTO) 10 MG TABS tablet Take 10 mg by mouth every evening.   Yes [provider]  SANTYL ointment Apply 1 application topically daily.  05/19/18  Yes [provider]  sodium hypochlorite (DAKIN'S 1/4 STRENGTH) 0.125 % SOLN Apply 1 application topically daily. Use as a wound cleanser, pat dry, then apply dressings 12/18/17  Yes [provider]  SPIRIVA HANDIHALER 18 MCG inhalation capsule Place 18 mcg into inhaler and inhale daily.  05/19/18  Yes [provider]  albuterol (PROVENTIL HFA;VENTOLIN HFA) 108 (90 Base) MCG/ACT inhaler Inhale 2 puffs into the lungs every 6 (six) hours as needed for wheezing or shortness of breath.    [provider]  albuterol (PROVENTIL) (2.5 MG/3ML) 0.083% nebulizer solution Inhale 2.5 mg into the lungs every 6 (six) hours as needed for wheezing or shortness of breath.     [provider]    guaiFENesin-dextromethorphan (ROBITUSSIN DM) 100-10 MG/5ML syrup Take 5 mLs by mouth every 4 (four) hours as needed for cough. 04/11/18   Milagros Loll, MD  oxyCODONE-acetaminophen (PERCOCET) 10-325 MG tablet Take 1 tablet by mouth every 6 (six) hours as needed for pain.     [provider]    Allergies Eggs or egg-derived products  Family History  Problem Relation Age of Onset  . Brain cancer Mother     Social History Social History   Tobacco Use  . Smoking status: Current Every Day Smoker    Packs/day: 1.50    Years: 47.00    Pack years: 70.50    Types: Cigarettes  . Smokeless tobacco: Never Used  . Tobacco comment: Smoking History 2.5-started at age 33  Substance Use Topics  . Alcohol use: No    Alcohol/week: 0.0 standard drinks  . Drug use: No    Review of Systems Constitutional: No fever/chills Eyes: No visual changes. ENT: No sore throat. Cardiovascular: Denies chest pain. Respiratory: Denies shortness of breath. Gastrointestinal: No abdominal pain.  No nausea, no vomiting.  No diarrhea.  No constipation. Genitourinary: Negative for dysuria. Musculoskeletal: Positive for foot pain Skin: Positive for wound Neurological: Negative for headaches, focal weakness or numbness.   ____________________________________________   PHYSICAL EXAM:  VITAL SIGNS: ED Triage Vitals [05/22/18 2117]  Enc Vitals Group     BP 99/73     Pulse Rate 96     Resp 16     Temp 97.6 F (36.4 C)     Temp Source Oral     SpO2 98 %     Weight 82 lb (37.2 kg)     Height 5\' 2"  (1.575 m)     Head Circumference      Peak Flow      Pain Score 0     Pain Loc      Pain Edu?      Excl. in GC?     Constitutional: Alert and oriented x4 appears uncomfortable though nontoxic no diaphoresis Eyes: PERRL EOMI. Head: Atraumatic. Nose: No congestion/rhinnorhea. Mouth/Throat: No trismus Neck: No stridor.   Cardiovascular: Tachycardic rate, regular rhythm. Grossly normal heart  sounds.  Good peripheral circulation. Respiratory: Normal respiratory effort.  No retractions. Lungs CTAB and moving good air Gastrointestinal: Soft nontender Musculoskeletal: Right foot neurovascularly intact.  Previous pinky toe amputation.  She has 2+ dorsalis pedis pulse Neurologic:  Normal speech and language. No gross focal neurologic deficits are appreciated. Skin: Please see photos below for better description of the medial wounds and cellulitis No crepitus.  Does not appear to be necrotizing Psychiatric: Mood and affect are normal. Speech and behavior are normal.        ____________________________________________   DIFFERENTIAL includes but not limited to  Necrotizing soft tissue infection, cellulitis, osteomyelitis, septic joint ____________________________________________   LABS (all labs ordered are listed, but only abnormal results are displayed)  Labs Reviewed  LACTIC ACID, PLASMA - Abnormal; Notable for the following components:      Result Value   Lactic Acid,  Venous 2.5 (*)    All other components within normal limits  COMPREHENSIVE METABOLIC PANEL - Abnormal; Notable for the following components:   Sodium 132 (*)    Potassium 5.3 (*)    Chloride 95 (*)    Glucose, Bld 513 (*)    Calcium 8.3 (*)    Albumin 2.7 (*)    All other components within normal limits  CBC WITH DIFFERENTIAL/PLATELET - Abnormal; Notable for the following components:   WBC 15.6 (*)    RBC 3.67 (*)    Hemoglobin 11.1 (*)    HCT 34.7 (*)    Neutro Abs 13.1 (*)    Abs Immature Granulocytes 0.08 (*)    All other components within normal limits  GLUCOSE, CAPILLARY - Abnormal; Notable for the following components:   Glucose-Capillary 431 (*)    All other components within normal limits  BASIC METABOLIC PANEL - Abnormal; Notable for the following components:   Glucose, Bld 397 (*)    Calcium 7.7 (*)    All other components within normal limits  CBC - Abnormal; Notable for the  following components:   WBC 12.4 (*)    RBC 3.08 (*)    Hemoglobin 9.3 (*)    HCT 29.5 (*)    All other components within normal limits  GLUCOSE, CAPILLARY - Abnormal; Notable for the following components:   Glucose-Capillary 429 (*)    All other components within normal limits  GLUCOSE, CAPILLARY - Abnormal; Notable for the following components:   Glucose-Capillary 143 (*)    All other components within normal limits  CBC - Abnormal; Notable for the following components:   WBC 15.8 (*)    RBC 3.08 (*)    Hemoglobin 9.3 (*)    HCT 30.3 (*)    All other components within normal limits  BASIC METABOLIC PANEL - Abnormal; Notable for the following components:   Glucose, Bld 132 (*)    BUN 27 (*)    Calcium 8.0 (*)    GFR calc non Af Amer 59 (*)    All other components within normal limits  GLUCOSE, CAPILLARY - Abnormal; Notable for the following components:   Glucose-Capillary 265 (*)    All other components within normal limits  GLUCOSE, CAPILLARY - Abnormal; Notable for the following components:   Glucose-Capillary 157 (*)    All other components within normal limits  URINALYSIS, ROUTINE W REFLEX MICROSCOPIC - Abnormal; Notable for the following components:   Color, Urine YELLOW (*)    APPearance TURBID (*)    Glucose, UA >=500 (*)    Hgb urine dipstick LARGE (*)    Protein, ur 100 (*)    Leukocytes, UA LARGE (*)    RBC / HPF >50 (*)    WBC, UA >50 (*)    All other components within normal limits  URINALYSIS, COMPLETE (UACMP) WITH MICROSCOPIC - Abnormal; Notable for the following components:   Color, Urine YELLOW (*)    APPearance TURBID (*)    Glucose, UA >=500 (*)    Hgb urine dipstick SMALL (*)    Protein, ur 100 (*)    Leukocytes, UA SMALL (*)    RBC / HPF >50 (*)    WBC, UA >50 (*)    All other components within normal limits  GLUCOSE, CAPILLARY - Abnormal; Notable for the following components:   Glucose-Capillary 146 (*)    All other components within normal  limits  BLOOD GAS, ARTERIAL - Abnormal; Notable for the following  components:   pH, Arterial 7.22 (*)    pCO2 arterial 60 (*)    pO2, Arterial 58 (*)    Acid-base deficit 3.8 (*)    All other components within normal limits  GLUCOSE, CAPILLARY - Abnormal; Notable for the following components:   Glucose-Capillary 175 (*)    All other components within normal limits  GLUCOSE, CAPILLARY - Abnormal; Notable for the following components:   Glucose-Capillary 198 (*)    All other components within normal limits  CULTURE, BLOOD (ROUTINE X 2)  CULTURE, BLOOD (ROUTINE X 2)  URINE CULTURE  LACTIC ACID, PLASMA  PROCALCITONIN  LACTIC ACID, PLASMA  LACTIC ACID, PLASMA  GLUCOSE, CAPILLARY    Lab work reviewed by me with elevated white count concerning for acute infection.  Elevated lactic acid concerning for early sepsis.  Very poorly controlled diabetes mellitus and low albumin will make wound healing difficult.  No evidence of DKA __________________________________________  EKG   ____________________________________________  RADIOLOGY  X-ray of the right foot reviewed by me with no obvious osteomyelitis ____________________________________________   PROCEDURES  Procedure(s) performed: no  .Critical Care Performed by: Merrily Brittle, MD Authorized by: Merrily Brittle, MD   Critical care provider statement:    Critical care time (minutes):  30   Critical care time was exclusive of:  Separately billable procedures and treating other patients   Critical care was necessary to treat or prevent imminent or life-threatening deterioration of the following conditions:  Sepsis and metabolic crisis   Critical care was time spent personally by me on the following activities:  Development of treatment plan with patient or surrogate, discussions with consultants, evaluation of patient's response to treatment, examination of patient, obtaining history from patient or surrogate, ordering and  performing treatments and interventions, ordering and review of laboratory studies, ordering and review of radiographic studies, pulse oximetry, re-evaluation of patient's condition and review of old charts    Critical Care performed: Yes  ____________________________________________   INITIAL IMPRESSION / ASSESSMENT AND PLAN / ED COURSE  Pertinent labs & imaging results that were available during my care of the patient were reviewed by me and considered in my medical decision making (see chart for details).   As part of my medical decision making, I reviewed the following data within the electronic MEDICAL RECORD NUMBER History obtained from family if available, nursing notes, old chart and ekg, as well as notes from prior ED visits.  The patient comes to the emergency department tachycardic and uncomfortable appearing with an obvious cellulitis and wound infection to her right foot.  Fortunately she has 2+ dorsalis pedis pulses.  For her pain and nausea we will give her 4 mg of IV morphine and 4 mg of IV Zofran.  Broad labs including lactic acid and blood cultures will be ordered given the appearance of the wound, her tachycardia, and her relative hypotension.  I will also cover her with ceftriaxone and vancomycin while labs and x-ray are pending.  The patient has a lactic acidosis to 2.5 and a white count of 15.  X-ray with no gas and no obvious osteomyelitis however at this point she does require inpatient admission for continued IV antibiotics, podiatry consultation, and likely surgical management of her wound.  The patient's pain is improved after the morphine and her heart rate is come down nicely.  I discussed with the hospitalist who has graciously agreed to admit the patient to his service.      ____________________________________________   FINAL CLINICAL IMPRESSION(S) /  ED DIAGNOSES  Final diagnoses:  Sepsis, due to unspecified organism, unspecified whether acute organ dysfunction  present [facility](HCC)      NEW MEDICATIONS STARTED DURING THIS VISIT:  Current Discharge Medication List       Note:  This document was prepared using Dragon voice recognition software and may include unintentional dictation errors.     Merrily Brittleifenbark, Betzaida Cremeens, MD 05/24/18 [id number]

## 2018-05-22 NOTE — ED Triage Notes (Signed)
Pt in via POV, family reports worsening wound to right foot; advised per wound care RN to be evaluated here.  Vitals WDL, NAD noted at this time.

## 2018-05-23 ENCOUNTER — Encounter: Payer: Self-pay | Admitting: Anesthesiology

## 2018-05-23 DIAGNOSIS — I70202 Unspecified atherosclerosis of native arteries of extremities, left leg: Secondary | ICD-10-CM | POA: Diagnosis present

## 2018-05-23 DIAGNOSIS — I70249 Atherosclerosis of native arteries of left leg with ulceration of unspecified site: Secondary | ICD-10-CM | POA: Diagnosis not present

## 2018-05-23 DIAGNOSIS — E1165 Type 2 diabetes mellitus with hyperglycemia: Secondary | ICD-10-CM | POA: Diagnosis present

## 2018-05-23 DIAGNOSIS — L97918 Non-pressure chronic ulcer of unspecified part of right lower leg with other specified severity: Secondary | ICD-10-CM | POA: Diagnosis not present

## 2018-05-23 DIAGNOSIS — E119 Type 2 diabetes mellitus without complications: Secondary | ICD-10-CM

## 2018-05-23 DIAGNOSIS — F17210 Nicotine dependence, cigarettes, uncomplicated: Secondary | ICD-10-CM

## 2018-05-23 DIAGNOSIS — J441 Chronic obstructive pulmonary disease with (acute) exacerbation: Secondary | ICD-10-CM | POA: Diagnosis not present

## 2018-05-23 DIAGNOSIS — I70245 Atherosclerosis of native arteries of left leg with ulceration of other part of foot: Secondary | ICD-10-CM | POA: Diagnosis not present

## 2018-05-23 DIAGNOSIS — E785 Hyperlipidemia, unspecified: Secondary | ICD-10-CM

## 2018-05-23 DIAGNOSIS — L97529 Non-pressure chronic ulcer of other part of left foot with unspecified severity: Secondary | ICD-10-CM | POA: Diagnosis not present

## 2018-05-23 DIAGNOSIS — E869 Volume depletion, unspecified: Secondary | ICD-10-CM | POA: Diagnosis not present

## 2018-05-23 DIAGNOSIS — Z681 Body mass index (BMI) 19 or less, adult: Secondary | ICD-10-CM | POA: Diagnosis not present

## 2018-05-23 DIAGNOSIS — E8770 Fluid overload, unspecified: Secondary | ICD-10-CM | POA: Diagnosis not present

## 2018-05-23 DIAGNOSIS — J9811 Atelectasis: Secondary | ICD-10-CM | POA: Diagnosis not present

## 2018-05-23 DIAGNOSIS — J9602 Acute respiratory failure with hypercapnia: Secondary | ICD-10-CM | POA: Diagnosis not present

## 2018-05-23 DIAGNOSIS — L89620 Pressure ulcer of left heel, unstageable: Secondary | ICD-10-CM | POA: Diagnosis present

## 2018-05-23 DIAGNOSIS — E1169 Type 2 diabetes mellitus with other specified complication: Secondary | ICD-10-CM | POA: Diagnosis present

## 2018-05-23 DIAGNOSIS — J9601 Acute respiratory failure with hypoxia: Secondary | ICD-10-CM | POA: Diagnosis not present

## 2018-05-23 DIAGNOSIS — M869 Osteomyelitis, unspecified: Secondary | ICD-10-CM | POA: Diagnosis not present

## 2018-05-23 DIAGNOSIS — E1152 Type 2 diabetes mellitus with diabetic peripheral angiopathy with gangrene: Secondary | ICD-10-CM | POA: Diagnosis present

## 2018-05-23 DIAGNOSIS — N390 Urinary tract infection, site not specified: Secondary | ICD-10-CM | POA: Diagnosis not present

## 2018-05-23 DIAGNOSIS — L97519 Non-pressure chronic ulcer of other part of right foot with unspecified severity: Secondary | ICD-10-CM

## 2018-05-23 DIAGNOSIS — M86171 Other acute osteomyelitis, right ankle and foot: Secondary | ICD-10-CM | POA: Diagnosis present

## 2018-05-23 DIAGNOSIS — J810 Acute pulmonary edema: Secondary | ICD-10-CM | POA: Diagnosis not present

## 2018-05-23 DIAGNOSIS — L97928 Non-pressure chronic ulcer of unspecified part of left lower leg with other specified severity: Secondary | ICD-10-CM | POA: Diagnosis not present

## 2018-05-23 DIAGNOSIS — E43 Unspecified severe protein-calorie malnutrition: Secondary | ICD-10-CM | POA: Diagnosis present

## 2018-05-23 DIAGNOSIS — H9190 Unspecified hearing loss, unspecified ear: Secondary | ICD-10-CM | POA: Diagnosis present

## 2018-05-23 DIAGNOSIS — I70235 Atherosclerosis of native arteries of right leg with ulceration of other part of foot: Secondary | ICD-10-CM

## 2018-05-23 DIAGNOSIS — N179 Acute kidney failure, unspecified: Secondary | ICD-10-CM | POA: Diagnosis not present

## 2018-05-23 DIAGNOSIS — Z9114 Patient's other noncompliance with medication regimen: Secondary | ICD-10-CM | POA: Diagnosis not present

## 2018-05-23 DIAGNOSIS — I96 Gangrene, not elsewhere classified: Secondary | ICD-10-CM | POA: Diagnosis present

## 2018-05-23 DIAGNOSIS — I70239 Atherosclerosis of native arteries of right leg with ulceration of unspecified site: Secondary | ICD-10-CM | POA: Diagnosis not present

## 2018-05-23 DIAGNOSIS — A419 Sepsis, unspecified organism: Secondary | ICD-10-CM | POA: Diagnosis present

## 2018-05-23 DIAGNOSIS — E11621 Type 2 diabetes mellitus with foot ulcer: Secondary | ICD-10-CM | POA: Diagnosis present

## 2018-05-23 DIAGNOSIS — L03115 Cellulitis of right lower limb: Secondary | ICD-10-CM | POA: Diagnosis present

## 2018-05-23 LAB — CBC
HEMATOCRIT: 29.5 % — AB (ref 36.0–46.0)
Hemoglobin: 9.3 g/dL — ABNORMAL LOW (ref 12.0–15.0)
MCH: 30.2 pg (ref 26.0–34.0)
MCHC: 31.5 g/dL (ref 30.0–36.0)
MCV: 95.8 fL (ref 80.0–100.0)
NRBC: 0 % (ref 0.0–0.2)
Platelets: 305 10*3/uL (ref 150–400)
RBC: 3.08 MIL/uL — AB (ref 3.87–5.11)
RDW: 12.4 % (ref 11.5–15.5)
WBC: 12.4 10*3/uL — ABNORMAL HIGH (ref 4.0–10.5)

## 2018-05-23 LAB — BASIC METABOLIC PANEL
ANION GAP: 5 (ref 5–15)
BUN: 20 mg/dL (ref 8–23)
CHLORIDE: 100 mmol/L (ref 98–111)
CO2: 30 mmol/L (ref 22–32)
Calcium: 7.7 mg/dL — ABNORMAL LOW (ref 8.9–10.3)
Creatinine, Ser: 0.82 mg/dL (ref 0.44–1.00)
GFR calc non Af Amer: >60 mL/min (ref >60)
GLUCOSE: 397 mg/dL — AB (ref 70–99)
Potassium: 4.1 mmol/L (ref 3.5–5.1)
Sodium: 135 mmol/L (ref 135–145)

## 2018-05-23 LAB — GLUCOSE, CAPILLARY
GLUCOSE-CAPILLARY: 157 mg/dL — AB (ref 70–99)
Glucose-Capillary: 431 mg/dL — ABNORMAL HIGH (ref 70–99)
Glucose-Capillary: 429 mg/dL — ABNORMAL HIGH (ref 70–99)
Glucose-Capillary: 143 mg/dL — ABNORMAL HIGH (ref 70–99)
Glucose-Capillary: 77 mg/dL (ref 70–99)
Glucose-Capillary: 265 mg/dL — ABNORMAL HIGH (ref 70–99)

## 2018-05-23 LAB — PROCALCITONIN

## 2018-05-23 LAB — LACTIC ACID, PLASMA
LACTIC ACID, VENOUS: 1.5 mmol/L (ref 0.5–1.9)
Lactic Acid, Venous: 1.7 mmol/L (ref 0.5–1.9)
Lactic Acid, Venous: 1.8 mmol/L (ref 0.5–1.9)

## 2018-05-23 MED ORDER — DAKINS (1/4 STRENGTH) 0.125 % EX SOLN
1.0000 "application " | Freq: Every day | CUTANEOUS | Status: AC
  Administered 2018-05-23: 1 via TOPICAL
  Filled 2018-05-23: qty 473

## 2018-05-23 MED ORDER — TIOTROPIUM BROMIDE MONOHYDRATE 18 MCG IN CAPS
18.0000 ug | ORAL_CAPSULE | Freq: Every day | RESPIRATORY_TRACT | Status: DC
  Administered 2018-05-23 – 2018-06-01 (×10): 18 ug via RESPIRATORY_TRACT
  Filled 2018-05-23 (×4): qty 5

## 2018-05-23 MED ORDER — ASPIRIN 81 MG PO CHEW
81.0000 mg | CHEWABLE_TABLET | Freq: Every day | ORAL | Status: DC
  Administered 2018-05-23 – 2018-06-01 (×8): 81 mg via ORAL
  Filled 2018-05-23 (×9): qty 1

## 2018-05-23 MED ORDER — PREMIER PROTEIN SHAKE
11.0000 [oz_av] | Freq: Two times a day (BID) | ORAL | Status: DC
  Administered 2018-05-23 – 2018-06-01 (×10): 11 [oz_av] via ORAL

## 2018-05-23 MED ORDER — TRAZODONE HCL 50 MG PO TABS
25.0000 mg | ORAL_TABLET | Freq: Every evening | ORAL | Status: DC | PRN
  Administered 2018-05-26 – 2018-05-31 (×4): 25 mg via ORAL
  Filled 2018-05-23 (×4): qty 1

## 2018-05-23 MED ORDER — OXYCODONE-ACETAMINOPHEN 10-325 MG PO TABS: 1.0000 | ORAL_TABLET | Freq: Four times a day (QID) | ORAL | Status: DC | PRN

## 2018-05-23 MED ORDER — HYDROCODONE-ACETAMINOPHEN 5-325 MG PO TABS
1.0000 | ORAL_TABLET | ORAL | Status: DC | PRN
  Administered 2018-05-23: 1 via ORAL
  Administered 2018-05-23 – 2018-05-27 (×6): 2 via ORAL
  Administered 2018-05-28: 1 via ORAL
  Administered 2018-05-29 – 2018-05-31 (×7): 2 via ORAL
  Filled 2018-05-23: qty 2
  Filled 2018-05-23: qty 1
  Filled 2018-05-23 (×8): qty 2
  Filled 2018-05-23 (×3): qty 1
  Filled 2018-05-23: qty 2
  Filled 2018-05-23: qty 1
  Filled 2018-05-23 (×3): qty 2

## 2018-05-23 MED ORDER — INSULIN ASPART 100 UNIT/ML ~~LOC~~ SOLN
10.0000 [IU] | Freq: Once | SUBCUTANEOUS | Status: AC
  Administered 2018-05-23: 10 [IU] via SUBCUTANEOUS
  Filled 2018-05-23: qty 1

## 2018-05-23 MED ORDER — BISACODYL 5 MG PO TBEC: 5.0000 mg | DELAYED_RELEASE_TABLET | Freq: Every day | ORAL | Status: DC | PRN

## 2018-05-23 MED ORDER — RIVAROXABAN 10 MG PO TABS
10.0000 mg | ORAL_TABLET | Freq: Every day | ORAL | Status: DC
  Filled 2018-05-23: qty 1

## 2018-05-23 MED ORDER — ONDANSETRON HCL 4 MG/2ML IJ SOLN
4.0000 mg | Freq: Four times a day (QID) | INTRAMUSCULAR | Status: DC | PRN
  Administered 2018-05-24 – 2018-05-26 (×2): 4 mg via INTRAVENOUS
  Filled 2018-05-23 (×2): qty 2

## 2018-05-23 MED ORDER — ONDANSETRON HCL 4 MG PO TABS: 4.0000 mg | ORAL_TABLET | Freq: Four times a day (QID) | ORAL | Status: DC | PRN

## 2018-05-23 MED ORDER — VITAMIN C 500 MG PO TABS
500.0000 mg | ORAL_TABLET | Freq: Two times a day (BID) | ORAL | Status: DC
  Administered 2018-05-23 – 2018-06-01 (×17): 500 mg via ORAL
  Filled 2018-05-23 (×20): qty 1

## 2018-05-23 MED ORDER — OCUVITE-LUTEIN PO CAPS
1.0000 | ORAL_CAPSULE | Freq: Every day | ORAL | Status: DC
  Administered 2018-05-23 – 2018-05-31 (×6): 1 via ORAL
  Filled 2018-05-23 (×10): qty 1

## 2018-05-23 MED ORDER — VITAMIN C 500 MG PO TABS: 250.0000 mg | ORAL_TABLET | Freq: Two times a day (BID) | ORAL | Status: DC

## 2018-05-23 MED ORDER — VANCOMYCIN HCL 500 MG IV SOLR
500.0000 mg | INTRAVENOUS | Status: DC
  Administered 2018-05-23 – 2018-05-24 (×2): 500 mg via INTRAVENOUS
  Filled 2018-05-23 (×3): qty 500

## 2018-05-23 MED ORDER — ACETAMINOPHEN 325 MG PO TABS
650.0000 mg | ORAL_TABLET | Freq: Four times a day (QID) | ORAL | Status: DC | PRN
  Administered 2018-05-25: 650 mg via ORAL
  Filled 2018-05-23: qty 2

## 2018-05-23 MED ORDER — PIPERACILLIN-TAZOBACTAM 3.375 G IVPB 30 MIN: 3.3750 g | Freq: Four times a day (QID) | INTRAVENOUS | Status: DC

## 2018-05-23 MED ORDER — FLUTICASONE FUROATE-VILANTEROL 200-25 MCG/INH IN AEPB
1.0000 | INHALATION_SPRAY | Freq: Every day | RESPIRATORY_TRACT | Status: DC
  Administered 2018-05-23 – 2018-06-01 (×10): 1 via RESPIRATORY_TRACT
  Filled 2018-05-23 (×2): qty 28

## 2018-05-23 MED ORDER — SODIUM CHLORIDE 0.9 % IV SOLN
INTRAVENOUS | Status: DC
  Administered 2018-05-23: 04:00:00 via INTRAVENOUS

## 2018-05-23 MED ORDER — GUAIFENESIN-DM 100-10 MG/5ML PO SYRP
5.0000 mL | ORAL_SOLUTION | ORAL | Status: DC | PRN
  Administered 2018-05-23 – 2018-05-30 (×2): 5 mL via ORAL
  Filled 2018-05-23 (×3): qty 5

## 2018-05-23 MED ORDER — INSULIN ASPART 100 UNIT/ML ~~LOC~~ SOLN
0.0000 [IU] | Freq: Three times a day (TID) | SUBCUTANEOUS | Status: DC
  Administered 2018-05-23: 5 [IU] via SUBCUTANEOUS
  Administered 2018-05-23: 1 [IU] via SUBCUTANEOUS
  Administered 2018-05-24 (×2): 2 [IU] via SUBCUTANEOUS
  Administered 2018-05-24: 1 [IU] via SUBCUTANEOUS
  Filled 2018-05-23 (×5): qty 1

## 2018-05-23 MED ORDER — PIPERACILLIN-TAZOBACTAM 3.375 G IVPB
3.3750 g | Freq: Three times a day (TID) | INTRAVENOUS | Status: DC
  Administered 2018-05-23 – 2018-05-28 (×15): 3.375 g via INTRAVENOUS
  Filled 2018-05-23 (×16): qty 50

## 2018-05-23 MED ORDER — ATORVASTATIN CALCIUM 20 MG PO TABS
40.0000 mg | ORAL_TABLET | Freq: Every day | ORAL | Status: DC
  Administered 2018-05-23 – 2018-05-31 (×7): 40 mg via ORAL
  Filled 2018-05-23 (×7): qty 2

## 2018-05-23 MED ORDER — INSULIN GLARGINE 100 UNIT/ML ~~LOC~~ SOLN
15.0000 [IU] | Freq: Every day | SUBCUTANEOUS | Status: DC
  Administered 2018-05-23: 15 [IU] via SUBCUTANEOUS
  Filled 2018-05-23: qty 0.15

## 2018-05-23 MED ORDER — ACETAMINOPHEN 650 MG RE SUPP: 650.0000 mg | Freq: Four times a day (QID) | RECTAL | Status: DC | PRN

## 2018-05-23 MED ORDER — PANTOPRAZOLE SODIUM 40 MG PO TBEC
40.0000 mg | DELAYED_RELEASE_TABLET | Freq: Two times a day (BID) | ORAL | Status: DC
  Administered 2018-05-23 – 2018-06-01 (×17): 40 mg via ORAL
  Filled 2018-05-23 (×19): qty 1

## 2018-05-23 MED ORDER — OXYCODONE HCL 5 MG PO TABS
5.0000 mg | ORAL_TABLET | Freq: Four times a day (QID) | ORAL | Status: DC | PRN
  Administered 2018-05-25 – 2018-05-31 (×8): 5 mg via ORAL
  Filled 2018-05-23 (×9): qty 1

## 2018-05-23 MED ORDER — ALBUTEROL SULFATE (2.5 MG/3ML) 0.083% IN NEBU: 2.5000 mg | INHALATION_SOLUTION | Freq: Four times a day (QID) | RESPIRATORY_TRACT | Status: DC | PRN

## 2018-05-23 MED ORDER — INSULIN ASPART 100 UNIT/ML ~~LOC~~ SOLN
0.0000 [IU] | Freq: Every day | SUBCUTANEOUS | Status: DC
  Administered 2018-05-24: 5 [IU] via SUBCUTANEOUS
  Filled 2018-05-23: qty 1

## 2018-05-23 MED ORDER — ALBUTEROL SULFATE HFA 108 (90 BASE) MCG/ACT IN AERS: 2.0000 | INHALATION_SPRAY | Freq: Four times a day (QID) | RESPIRATORY_TRACT | Status: DC | PRN

## 2018-05-23 MED ORDER — OXYCODONE-ACETAMINOPHEN 5-325 MG PO TABS
1.0000 | ORAL_TABLET | Freq: Four times a day (QID) | ORAL | Status: DC | PRN
  Administered 2018-05-25 – 2018-05-31 (×8): 1 via ORAL
  Filled 2018-05-23 (×8): qty 1

## 2018-05-23 MED ORDER — DOCUSATE SODIUM 100 MG PO CAPS
100.0000 mg | ORAL_CAPSULE | Freq: Two times a day (BID) | ORAL | Status: DC
  Administered 2018-05-23 – 2018-06-01 (×8): 100 mg via ORAL
  Filled 2018-05-23 (×15): qty 1

## 2018-05-23 NOTE — Anesthesia Preprocedure Evaluation (Addendum)
Anesthesia Evaluation  Patient identified by MRN, date of birth, ID band Patient awake    Reviewed: Allergy & Precautions, NPO status , Patient's Chart, lab work & pertinent test results  Airway Mallampati: III       Dental   Pulmonary COPD, Current Smoker,           Cardiovascular + Peripheral Vascular Disease and + DVT  Normal cardiovascular exam     Neuro/Psych PSYCHIATRIC DISORDERS Anxiety    GI/Hepatic Neg liver ROS, Colon polyps   Endo/Other  diabetes  Renal/GU negative Renal ROS  negative genitourinary   Musculoskeletal   Abdominal Normal abdominal exam  (+)   Peds negative pediatric ROS (+)  Hematology negative hematology ROS (+)   Anesthesia Other Findings Past Medical History: No date: Anxiety No date: Colon polyps No date: COPD (chronic obstructive pulmonary disease) (HCC) No date: Diabetes mellitus without complication (HCC) No date: DVT (deep venous thrombosis) (HCC) No date: DVT (deep venous thrombosis) (HCC) No date: History of colonic polyps No date: Hyperlipidemia No date: Peripheral artery disease (HCC) 08/2012: Pulmonary nodules/lesions, multiple     Comment:  NEGATIVE PET SCAN No date: Retroperitoneal abscess (HCC)     Comment:  history of in 04/2013 No date: Tobacco abuse  Reproductive/Obstetrics                           Anesthesia Physical Anesthesia Plan  ASA: III  Anesthesia Plan: General   Post-op Pain Management:    Induction: Intravenous  PONV Risk Score and Plan: TIVA  Airway Management Planned: Nasal Cannula  Additional Equipment:   Intra-op Plan:   Post-operative Plan:   Informed Consent: I have reviewed the patients History and Physical, chart, labs and discussed the procedure including the risks, benefits and alternatives for the proposed anesthesia with the patient or authorized representative who has indicated his/her understanding and  acceptance.   Dental advisory given  Plan Discussed with: CRNA and Surgeon  Anesthesia Plan Comments:        Anesthesia Quick Evaluation

## 2018-05-23 NOTE — Consult Note (Signed)
[facility]                                                      Patient Demographics  April DonnaDoris [name], is a 62 y.o. female   MRN: [id number]   DOB - 03/05/1956  Admit Date - 05/22/2018    Outpatient Primary MD for the patient is Wroth, Sydnee Cabalhomas H, MD  Consult requested in the Hospital by Enid BaasKalisetti, Radhika, MD, On 05/23/2018    Reason for consult bilateral foot ulcers   With History of -  Past Medical History:  Diagnosis Date  . Anxiety   . Colon polyps   . COPD (chronic obstructive pulmonary disease) (HCC)   . Diabetes mellitus without complication (HCC)   . DVT (deep venous thrombosis) (HCC)   . DVT (deep venous thrombosis) (HCC)   . History of colonic polyps   . Hyperlipidemia   . Peripheral artery disease (HCC)   . Pulmonary nodules/lesions, multiple 08/2012   NEGATIVE PET SCAN  . Retroperitoneal abscess (HCC)    history of in 04/2013  . Tobacco abuse       Past Surgical History:  Procedure Laterality Date  . CHOLECYSTECTOMY    . COLONOSCOPY  05/2007    in for   Chief Complaint  Patient presents with  . Wound Check     HPI  April [name]  is a 62 y.o. female, patient has a history of ulceration on the right foot.  I saw her about a month and a half ago here in the hospital she had a small infected wound on the right first metatarsal area that was stable we treated initially with IV antibiotics and then released her on oral antibiotics and [facility].  She was supposed to have an appointment that week but she states that her driver that takes her places his car breakdown she had to call the appointment and change it and never went back.  She presents today with worsening ulceration on the right foot along with a worsening decubitus  ulceration on her left heel    Review of Systems: She is alert well oriented today she is pleasant does not seem to be in much pain.  In addition to the HPI above,  No Fever-chills, No Headache, No changes with Vision or hearing, No problems swallowing food or Liquids, No Chest pain, Cough or Shortness of Breath, No Abdominal pain, No Nausea or Vommitting, Bowel movements are regular, No Blood in stool or Urine, No dysuria, Dermatological the patient has a rash on her back arms and legs.  She has increasing cellulitis to the right foot emanating from the first metatarsal head ulceration also has a large decubitus ulcer appears to be full-thickness on the right heel.  No new joints pains-aches,  No new weakness, tingling, numbness in any extremity, No recent weight gain or loss, No polyuria, polydypsia or polyphagia, No significant Mental Stressors.  A full 10 point Review of Systems was done, except as stated above, all other Review of Systems were negative.   Social History Social History   Tobacco Use  . Smoking status: Current Every Day Smoker    Packs/day: 1.50    Years: 47.00    Pack years:  70.50    Types: Cigarettes  . Smokeless tobacco: Never Used  . Tobacco comment: Smoking History 2.5-started at age 61  Substance Use Topics  . Alcohol use: No    Alcohol/week: 0.0 standard drinks    Family History Family History  Problem Relation Age of Onset  . Brain cancer Mother     Prior to Admission medications   Medication Sig Start Date End Date Taking? Authorizing Provider  aspirin 81 MG chewable tablet Chew 81 mg by mouth daily. 05/30/17  Yes [provider]  atorvastatin (LIPITOR) 40 MG tablet Take 40 mg by mouth daily. 10/24/17 10/24/18 Yes [provider]  Fluticasone-Salmeterol (ADVAIR DISKUS) 250-50 MCG/DOSE AEPB Inhale 1 puff into the lungs 2 (two) times daily. 03/14/15 01/16/26 Yes Beers, Charmayne Sheer, PA-C  pantoprazole (PROTONIX) 40 MG tablet  Take 40 mg by mouth 2 (two) times daily.  04/25/18  Yes [provider]  rivaroxaban (XARELTO) 10 MG TABS tablet Take 10 mg by mouth every evening.   Yes [provider]  SANTYL ointment Apply 1 application topically daily.  05/19/18  Yes [provider]  sodium hypochlorite (DAKIN'S 1/4 STRENGTH) 0.125 % SOLN Apply 1 application topically daily. Use as a wound cleanser, pat dry, then apply dressings 12/18/17  Yes [provider]  SPIRIVA HANDIHALER 18 MCG inhalation capsule Place 18 mcg into inhaler and inhale daily.  05/19/18  Yes [provider]  albuterol (PROVENTIL HFA;VENTOLIN HFA) 108 (90 Base) MCG/ACT inhaler Inhale 2 puffs into the lungs every 6 (six) hours as needed for wheezing or shortness of breath.    [provider]  albuterol (PROVENTIL) (2.5 MG/3ML) 0.083% nebulizer solution Inhale 2.5 mg into the lungs every 6 (six) hours as needed for wheezing or shortness of breath.     [provider]  guaiFENesin-dextromethorphan (ROBITUSSIN DM) 100-10 MG/5ML syrup Take 5 mLs by mouth every 4 (four) hours as needed for cough. 04/11/18   Milagros Loll, MD  oxyCODONE-acetaminophen (PERCOCET) 10-325 MG tablet Take 1 tablet by mouth every 6 (six) hours as needed for pain.     [provider]    Anti-infectives (From admission, onward)   Start     Dose/Rate Route Frequency Ordered Stop   05/23/18 2300  vancomycin (VANCOCIN) 500 mg in sodium chloride 0.9 % 100 mL IVPB     500 mg 100 mL/hr over 60 Minutes Intravenous Every 24 hours 05/23/18 0555     05/23/18 0600  piperacillin-tazobactam (ZOSYN) IVPB 3.375 g  Status:  Discontinued     3.375 g 100 mL/hr over 30 Minutes Intravenous Every 6 hours 05/23/18 0253 05/23/18 0301   05/23/18 0315  piperacillin-tazobactam (ZOSYN) IVPB 3.375 g     3.375 g 12.5 mL/hr over 240 Minutes Intravenous Every 8 hours 05/23/18 0301     05/22/18 2330  vancomycin (VANCOCIN) IVPB 1000 mg/200 mL  premix     1,000 mg 200 mL/hr over 60 Minutes Intravenous  Once 05/22/18 2329 05/23/18 0037   05/22/18 2330  cefTRIAXone (ROCEPHIN) 2 g in sodium chloride 0.9 % 100 mL IVPB  Status:  Discontinued     2 g 200 mL/hr over 30 Minutes Intravenous Every 24 hours 05/22/18 2329 05/23/18 0301      Scheduled Meds: . aspirin  81 mg Oral Daily  . atorvastatin  40 mg Oral Daily  . docusate sodium  100 mg Oral BID  . fluticasone furoate-vilanterol  1 puff Inhalation Daily  . insulin aspart  0-5 Units Subcutaneous  QHS  . insulin aspart  0-9 Units Subcutaneous TID WC  . insulin glargine  15 Units Subcutaneous Daily  . pantoprazole  40 mg Oral BID  . rivaroxaban  10 mg Oral Q supper  . sodium hypochlorite  1 application Topical Daily  . tiotropium  18 mcg Inhalation Daily   Continuous Infusions: . sodium chloride 75 mL/hr at 05/23/18 0340  . piperacillin-tazobactam (ZOSYN)  IV Stopped (05/23/18 0744)  . vancomycin     PRN Meds:.acetaminophen **OR** acetaminophen, albuterol, bisacodyl, guaiFENesin-dextromethorphan, HYDROcodone-acetaminophen, ondansetron **OR** ondansetron (ZOFRAN) IV, oxyCODONE-acetaminophen **AND** oxyCODONE, traZODone  Allergies  Allergen Reactions  . Eggs Or Egg-Derived Products Diarrhea, Itching and Nausea And Vomiting    Physical Exam  Vitals  Blood pressure (!) 99/51, pulse 74, temperature 98.2 F (36.8 C), temperature source Oral, resp. rate 20, height 5\' 2"  (1.575 m), weight 37.2 kg, SpO2 100 %.  Lower Extremity exam:  Vascular: The pedal pulses nonpalpable.  Patient has been a heavy smoker over the years.  I think she is been followed for this over at [facility] where she was supposed to go back for an appointment after last hospital visit here but she is not gone back.  Dermatological: Ulceration right foot is about 1.5 cm diameter at the medial first metatarsal head.  Patient has an extended area of cellulitis migrating proximally from this region about 7 cm on the  plantar medial aspect of the foot. On the left heel there is a necrotic eschar on the back of the heel there is approximately 3 cm in diameter of uncertain depth due to the level of necrosis but it appears to likely penetrate into the deeper tissues at least to the fat.  Neurological: Likely some peripheral neuropathy  Ortho: Uncertain as to whether osteomyelitis is involved yet.  MRIs are pending.  X-rays did not show any specific evidence of osteomyelitis as of yet.  Data Review  CBC Recent Labs  Lab 05/22/18 2124 05/23/18 0426  WBC 15.6* 12.4*  HGB 11.1* 9.3*  HCT 34.7* 29.5*  PLT 351 305  MCV 94.6 95.8  MCH 30.2 30.2  MCHC 32.0 31.5  RDW 12.4 12.4  LYMPHSABS 1.7  --   MONOABS 0.6  --   EOSABS 0.1  --   BASOSABS 0.1  --    ------------------------------------------------------------------------------------------------------------------  Chemistries  Recent Labs  Lab 05/22/18 2124 05/23/18 0426  NA 132* 135  K 5.3* 4.1  CL 95* 100  CO2 32 30  GLUCOSE 513* 397*  BUN 20 20  CREATININE 0.94 0.82  CALCIUM 8.3* 7.7*  AST 15  --   ALT 10  --   ALKPHOS 108  --   BILITOT 0.3  --    ------------------------------------------------------------------------------------------------------------------ estimated creatinine clearance is 41.2 mL/min (by C-G formula based on SCr of 0.82 mg/dL). ------------------------------------------------------------------------------------------------------------------ No results for input(s): TSH, T4TOTAL, T3FREE, THYROIDAB in the last 72 hours.  Invalid input(s): FREET3 Urinalysis    Component Value Date/Time   COLORURINE YELLOW (A) 04/10/2018 1208   APPEARANCEUR TURBID (A) 04/10/2018 1208   APPEARANCEUR Clear 10/13/2014 1412   LABSPEC 1.024 04/10/2018 1208   LABSPEC 1.031 10/13/2014 1412   PHURINE 5.0 04/10/2018 1208   GLUCOSEU >=500 (A) 04/10/2018 1208   GLUCOSEU >=500 10/13/2014 1412   HGBUR LARGE (A) 04/10/2018 1208    BILIRUBINUR NEGATIVE 04/10/2018 1208   BILIRUBINUR Negative 10/13/2014 1412   KETONESUR NEGATIVE 04/10/2018 1208   PROTEINUR 100 (A) 04/10/2018 1208   NITRITE NEGATIVE 04/10/2018 1208   LEUKOCYTESUR  MODERATE (A) 04/10/2018 1208   LEUKOCYTESUR 1+ 10/13/2014 1412     Imaging results:   Dg Foot Complete Right  Result Date: 05/22/2018 CLINICAL DATA:  Worsening wound to the right foot. 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 Focal cortical scalloping along the lateral fifth metatarsal shaft is again demonstrated without significant change. This is likely postoperative. No definite evidence of bone erosion to suggest osteomyelitis. No evidence of acute fracture or dislocation. No focal bone lesion or bone destruction. Soft tissue defect on the plantar aspect of the foot at the level of the metatarsal heads consistent with ulceration. No underlying bone erosions. Prominent vascular calcifications. 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 Persistent cortical scalloping without change, likely postoperative. Soft tissue ulceration over the metatarsal heads. No definite radiographic evidence of osteomyelitis. Electronically Signed   By: Burman Nieves M.D.   On: 05/22/2018 23:52    Assessment & Plan: I think the right foot needs incision and drainage from the region.  I am concerned because there is some potential tissue damage trying to start on the plantar aspect of the right foot to include a little bit of skin.  Overall her white count is come down from 15-12 which is a good sign.  I am awaiting the MRI to see if there is any evidence of bone involvement that will dictate what I have to do surgically.  On the left heel she will need a debridement and removal of the necrotic eschar and likely wound VAC application that region.  Have also asked for a vascular consult  bilaterally because of think she will need vascular reconstruction if she can get these areas healed up.  I explained to her today she is at risk of limb loss to both feet due to these ulcerations and infections.  She states she wants to stop smoking but she is been a smoker for many years.  Also I think she is somewhat noncompliant with dressings and protective shoe gear and she will need to change how she approaches both feet.  I will get her set up for surgery tomorrow morning here at [facility] to try to accomplish some progression for these wounds.  I explained to her the need for the procedures and explained the consent.  She understands accepts the risk and possible complications of the surgery.  She understands she is at risk for limb loss if the infection worsens.  Active Problems:   Acute osteomyelitis of right foot [facility])   Family Communication: Plan discussed with patient  Recardo Evangelist M.D on 05/23/2018 at 9:53 AM  Thank you for the consult, we will follow the patient with you in the Hospital.

## 2018-05-23 NOTE — H&P (Addendum)
[facility] - [place] at [facility]   PATIENT NAME: April [name]    MR#:  [id number]  DATE OF BIRTH:  Mar 10, 1956  DATE OF ADMISSION:  05/22/2018  PRIMARY CARE PHYSICIAN: Mickel FuchsWroth, Thomas H, MD   REQUESTING/REFERRING PHYSICIAN:   CHIEF COMPLAINT:   Chief Complaint  Patient presents with  . Wound Check    HISTORY OF PRESENT ILLNESS: April [name]  is a 62 y.o. female with a known history of peripheral vascular disease, tobacco abuse, COPD, diabetes type 2 and nonhealing wounds on her feet. Patient is poor historian and hard of hearing, unable to provide reliable history.  Most information was taken from reviewing the medical records and from discussion with the emergency room physician and the family. Patient presented to emergency room for evaluation of her wounds.  Per family, patient's wounds have been looking worse in the past week.  They also noted a new rash all over her skin, that does not seem to bother the patient.  She continues to smoke 1-1/2 pack of cigarettes a day and she is noncompliant with her medications including her insulin.  No reports of fever at home. Blood test done emergency room are notable for elevated WBC at 1500, elevated lactic acid at 2.5, elevated glucose level at 513.  Sodium is 132.  Potassium is 5.3. 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 Persistent cortical scalloping without change, likely postoperative. Soft tissue ulceration over the metatarsal heads. No definite radiographic evidence of osteomyelitis. Patient is admitted for further evaluation and treatment.  PAST MEDICAL HISTORY:   Past Medical History:  Diagnosis Date  . Anxiety   . Colon polyps   . COPD (chronic obstructive pulmonary disease) (HCC)   . Diabetes mellitus without complication (HCC)   . DVT (deep venous thrombosis) (HCC)   . DVT (deep venous thrombosis) (HCC)   . History of colonic polyps   .  Hyperlipidemia   . Peripheral artery disease (HCC)   . Pulmonary nodules/lesions, multiple 08/2012   NEGATIVE PET SCAN  . Retroperitoneal abscess (HCC)    history of in 04/2013  . Tobacco abuse     PAST SURGICAL HISTORY:  Past Surgical History:  Procedure Laterality Date  . CHOLECYSTECTOMY    . COLONOSCOPY  05/2007    SOCIAL HISTORY:  Social History   Tobacco Use  . Smoking status: Current Every Day Smoker    Packs/day: 1.50    Years: 47.00    Pack years: 70.50    Types: Cigarettes  . Smokeless tobacco: Never Used  . Tobacco comment: Smoking History 2.5-started at age 62  Substance Use Topics  . Alcohol use: No    Alcohol/week: 0.0 standard drinks    FAMILY HISTORY:  Family History  Problem Relation Age of Onset  . Brain cancer Mother     DRUG ALLERGIES:  Allergies  Allergen Reactions  . Eggs Or Egg-Derived Products Diarrhea, Itching and Nausea And Vomiting    REVIEW OF SYSTEMS:   CONSTITUTIONAL: No fever, but positive for fatigue and generalized weakness.  EYES: No changes in vision.  EARS, NOSE, AND THROAT: No tinnitus or ear pain.  Significant bilateral hearing deficit noted. RESPIRATORY: Positive for chronic cough, shortness of breath.  No wheezing or hemoptysis.  CARDIOVASCULAR: No chest pain, orthopnea, edema.  GASTROINTESTINAL: No nausea, vomiting, diarrhea or abdominal pain.  GENITOURINARY: No dysuria, hematuria.  ENDOCRINE: No polyuria, nocturia. HEMATOLOGY: No bleeding. SKIN: Positive for nonhealing wounds  on the feet. MUSCULOSKELETAL: No joint pain at this time.   NEUROLOGIC: No focal weakness.  PSYCHIATRY: No anxiety or depression.   MEDICATIONS AT HOME:  Prior to Admission medications   Medication Sig Start Date End Date Taking? Authorizing Provider  aspirin 81 MG chewable tablet Chew 81 mg by mouth daily. 05/30/17  Yes [provider]  atorvastatin (LIPITOR) 40 MG tablet Take 40 mg by mouth daily. 10/24/17 10/24/18 Yes [provider]  Fluticasone-Salmeterol (ADVAIR DISKUS) 250-50 MCG/DOSE AEPB Inhale 1 puff into the lungs 2 (two) times daily. 03/14/15 01/16/26 Yes Beers, Charmayne Sheer, PA-C  pantoprazole (PROTONIX) 40 MG tablet Take 40 mg by mouth 2 (two) times daily.  04/25/18  Yes [provider]  rivaroxaban (XARELTO) 10 MG TABS tablet Take 10 mg by mouth every evening.   Yes [provider]  SANTYL ointment Apply 1 application topically daily.  05/19/18  Yes [provider]  sodium hypochlorite (DAKIN'S 1/4 STRENGTH) 0.125 % SOLN Apply 1 application topically daily. Use as a wound cleanser, pat dry, then apply dressings 12/18/17  Yes [provider]  SPIRIVA HANDIHALER 18 MCG inhalation capsule Place 18 mcg into inhaler and inhale daily.  05/19/18  Yes [provider]  albuterol (PROVENTIL HFA;VENTOLIN HFA) 108 (90 Base) MCG/ACT inhaler Inhale 2 puffs into the lungs every 6 (six) hours as needed for wheezing or shortness of breath.    [provider]  albuterol (PROVENTIL) (2.5 MG/3ML) 0.083% nebulizer solution Inhale 2.5 mg into the lungs every 6 (six) hours as needed for wheezing or shortness of breath.     [provider]  guaiFENesin-dextromethorphan (ROBITUSSIN DM) 100-10 MG/5ML syrup Take 5 mLs by mouth every 4 (four) hours as needed for cough. 04/11/18   Milagros Loll, MD  oxyCODONE-acetaminophen (PERCOCET) 10-325 MG tablet Take 1 tablet by mouth every 6 (six) hours as needed for pain.     [provider]      PHYSICAL EXAMINATION:   VITAL SIGNS: Blood pressure 119/69, pulse 86, temperature 97.6 F (36.4 C), temperature source Oral, resp. rate 18, height 5\' 2"  (1.575 m), weight 37.2 kg, SpO2 100 %.  GENERAL:  62 y.o.-year-old patient lying in the bed with no acute distress.  EYES: Pupils equal, round, reactive to light and accommodation. No scleral icterus. Extraocular muscles intact.  HEENT: Head atraumatic, normocephalic. Oropharynx  and nasopharynx clear.  Significant bilateral hearing deficit is noted. NECK:  Supple, no jugular venous distention. No thyroid enlargement, no tenderness.  LUNGS: Reduced breath sounds bilaterally, no wheezing, rales,rhonchi or crepitation. No use of accessory muscles of respiration.  CARDIOVASCULAR: S1, S2 normal. No S3/S4.  ABDOMEN: Soft, nontender, nondistended. Bowel sounds present. No organomegaly or mass.  EXTREMITIES: Nonhealing wounds on bilateral lower extremities, as described under the skin exam. Dorsalis pedis and post tibial pulses are absent bilaterally.   NEUROLOGIC: No focal weakness. PSYCHIATRIC: The patient is alert and oriented x 3.  SKIN: There is a 6 mm in diameter, full-thickness ulceration noted along the medial aspect of the right first metatarsal.  The surrounding area is noted with erythema, increased warmth and swelling.Full-thickness necrotic appearing ulceration is noted on the plantar aspect of the left heel approximately 2 cm in diameter.  Generalized petechial rash is noted all over her body.   LABORATORY PANEL:   CBC Recent Labs  Lab 05/22/18 2124  WBC 15.6*  HGB 11.1*  HCT 34.7*  PLT 351  MCV 94.6  MCH 30.2  MCHC 32.0  RDW 12.4  LYMPHSABS 1.7  MONOABS 0.6  EOSABS 0.1  BASOSABS 0.1   ------------------------------------------------------------------------------------------------------------------  Chemistries  Recent Labs  Lab 05/22/18 2124  NA 132*  K 5.3*  CL 95*  CO2 32  GLUCOSE 513*  BUN 20  CREATININE 0.94  CALCIUM 8.3*  AST 15  ALT 10  ALKPHOS 108  BILITOT 0.3   ------------------------------------------------------------------------------------------------------------------ estimated creatinine clearance is 36 mL/min (by C-G formula based on SCr of 0.94 mg/dL). ------------------------------------------------------------------------------------------------------------------ No results for input(s): TSH, T4TOTAL, T3FREE,  THYROIDAB in the last 72 hours.  Invalid input(s): FREET3   Coagulation profile No results for input(s): INR, PROTIME in the last 168 hours. ------------------------------------------------------------------------------------------------------------------- No results for input(s): DDIMER in the last 72 hours. -------------------------------------------------------------------------------------------------------------------  Cardiac Enzymes No results for input(s): CKMB, TROPONINI, MYOGLOBIN in the last 168 hours.  Invalid input(s): CK ------------------------------------------------------------------------------------------------------------------ Invalid input(s): POCBNP  ---------------------------------------------------------------------------------------------------------------  Urinalysis    Component Value Date/Time   COLORURINE YELLOW (A) 04/10/2018 1208   APPEARANCEUR TURBID (A) 04/10/2018 1208   APPEARANCEUR Clear 10/13/2014 1412   LABSPEC 1.024 04/10/2018 1208   LABSPEC 1.031 10/13/2014 1412   PHURINE 5.0 04/10/2018 1208   GLUCOSEU >=500 (A) 04/10/2018 1208   GLUCOSEU >=500 10/13/2014 1412   HGBUR LARGE (A) 04/10/2018 1208   BILIRUBINUR NEGATIVE 04/10/2018 1208   BILIRUBINUR Negative 10/13/2014 1412   KETONESUR NEGATIVE 04/10/2018 1208   PROTEINUR 100 (A) 04/10/2018 1208   NITRITE NEGATIVE 04/10/2018 1208   LEUKOCYTESUR MODERATE (A) 04/10/2018 1208   LEUKOCYTESUR 1+ 10/13/2014 1412     RADIOLOGY: Dg Foot Complete Right  Result Date: 05/22/2018 CLINICAL DATA:  Worsening wound to the right foot. 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 Focal cortical scalloping along the lateral fifth metatarsal shaft is again demonstrated without significant change. This is likely postoperative. No definite evidence of bone erosion to suggest osteomyelitis. No evidence of acute  fracture or dislocation. No focal bone lesion or bone destruction. Soft tissue defect on the plantar aspect of the foot at the level of the metatarsal heads consistent with ulceration. No underlying bone erosions. Prominent vascular calcifications. 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 Persistent cortical scalloping without change, likely postoperative. Soft tissue ulceration over the metatarsal heads. No definite radiographic evidence of osteomyelitis. Electronically Signed   By: Burman Nieves M.D.   On: 05/22/2018 23:52    EKG: Orders placed or performed during the hospital encounter of 06/05/16  . ED EKG  . ED EKG  . EKG    IMPRESSION AND PLAN:   1.  Sepsis, likely secondary to infected wounds.  We will start treatment with IV fluids and IV antibiotics, Zosyn and vancomycin.  Follow lactic acid level.  Podiatry is consulted for further evaluation and treatment. 2.  Bilateral lower extremities nonhealing ulcers.  Will need further evaluation with MRI to rule out osteomyelitis.  We will start IV antibiotics and have podiatry evaluate the patient for further surgical procedure. 3.  Severe peripheral vascular disease, likely related to tobacco abuse and uncontrolled diabetes. 4.  Uncontrolled diabetes type 2.  Initial blood sugar is elevated at 513.  Patient is noncompliant with her medications.  Will monitor blood sugars before meals and at bedtime and use insulin treatment during the hospital stay.  Compliance with medication was discussed with patient and her family. 5.  Tobacco abuse.  Smoking cessation was discussed with patient in  detail. 6.  COPD, without acute exacerbation.  Continue maintenance therapy. 7.  Generalized petechial rash, asymptomatic, likely related to infectious process.  Continue to monitor clinically closely, while treating the underlying disease.  All the records are reviewed and case discussed with ED  provider. Management plans discussed with the patient, family and they are in agreement.  CODE STATUS:DNR    Code Status Orders  (From admission, onward)         Start     Ordered   05/23/18 0139  Do not attempt resuscitation (DNR)  Continuous    Question Answer Comment  In the event of cardiac or respiratory ARREST Do not call a "code blue"   In the event of cardiac or respiratory ARREST Do not perform Intubation, CPR, defibrillation or ACLS   In the event of cardiac or respiratory ARREST Use medication by any route, position, wound care, and other measures to relive pain and suffering. May use oxygen, suction and manual treatment of airway obstruction as needed for comfort.      05/23/18 0138        Code Status History    Date Active Date Inactive Code Status Order ID Comments User Context   04/10/2018 1111 04/11/2018 1554 DNR [id number]  Milagros Loll, MD Inpatient   04/09/2018 1519 04/10/2018 1111 Full Code [id number]  Milagros Loll, MD Inpatient   04/09/2018 1337 04/09/2018 1519 DNR [id number]  Milagros Loll, MD Inpatient   04/09/2018 0012 04/09/2018 1337 Full Code [id number]  Oralia Manis, MD ED       TOTAL TIME TAKING CARE OF THIS PATIENT: 50 minutes.    Cammy Copa M.D on 05/23/2018 at 1:39 AM  Between 7am to 6pm - Pager - [phone]  After 6pm go to [email or web address] - password [email or web address] [facility] [place] at [facility]  [phone]  CC: Primary care physician; Mickel Fuchs, MD

## 2018-05-23 NOTE — Care Management (Signed)
Patient will have I/D of abscess and possible removal of bone on 11/23 by Dr Orland Jarredroxler

## 2018-05-23 NOTE — Progress Notes (Signed)
[facility] -  at [facility]   PATIENT NAME: April [name]    MR#:  [id number]  DATE OF BIRTH:  Dec 14, 1955  SUBJECTIVE:  CHIEF COMPLAINT:   Chief Complaint  Patient presents with  . Wound Check   -Came in with right fifth metatarsal ulcer.  Has bilateral lower extremity ulcerations. -For I&D by podiatry tomorrow.  REVIEW OF SYSTEMS:  Review of Systems  Constitutional: Negative for chills and fever.  HENT: Negative for congestion, ear discharge, hearing loss and nosebleeds.   Eyes: Negative for blurred vision and double vision.  Respiratory: Negative for cough, shortness of breath and wheezing.   Cardiovascular: Negative for chest pain and palpitations.  Gastrointestinal: Negative for abdominal pain, constipation, diarrhea, nausea and vomiting.  Genitourinary: Negative for dysuria.  Musculoskeletal: Positive for joint pain and myalgias.  Skin: Positive for rash.  Neurological: Negative for dizziness, focal weakness, seizures, weakness and headaches.  Psychiatric/Behavioral: Negative for depression.    DRUG ALLERGIES:   Allergies  Allergen Reactions  . Eggs Or Egg-Derived Products Diarrhea, Itching and Nausea And Vomiting    VITALS:  Blood pressure (!) 99/51, pulse 74, temperature 98.2 F (36.8 C), temperature source Oral, resp. rate 20, height 5\' 2"  (1.575 m), weight 37.2 kg, SpO2 100 %.  PHYSICAL EXAMINATION:  Physical Exam  GENERAL:  62 y.o.-year-old patient lying in the bed with no acute distress.  Malnourished EYES: Pupils equal, round, reactive to light and accommodation. No scleral icterus. Extraocular muscles intact.  HEENT: Head atraumatic, normocephalic. Oropharynx and nasopharynx clear.  NECK:  Supple, no jugular venous distention. No thyroid enlargement, no tenderness.  LUNGS: Normal breath sounds bilaterally, no wheezing, rales,rhonchi or crepitation. No use of accessory muscles of respiration.  Decreased bibasilar breath  sounds CARDIOVASCULAR: S1, S2 normal. No murmurs, rubs, or gallops.  ABDOMEN: Soft, nontender, nondistended. Bowel sounds present. No organomegaly or mass.  EXTREMITIES: No pedal edema, cyanosis, or clubbing.  Unable to palpate dorsalis pedis pulses. Ischemic ulcer left heel, also ulcer on the plantar surface of fifth metatarsal NEUROLOGIC: Cranial nerves II through XII are intact. Muscle strength 5/5 in all extremities. Sensation intact. Gait not checked.  PSYCHIATRIC: The patient is alert and oriented x 3.  SKIN: Diffuse macular rash noted on upper and lower extremities including the palms.  Denies any itching   LABORATORY PANEL:   CBC Recent Labs  Lab 05/23/18 0426  WBC 12.4*  HGB 9.3*  HCT 29.5*  PLT 305   ------------------------------------------------------------------------------------------------------------------  Chemistries  Recent Labs  Lab 05/22/18 2124 05/23/18 0426  NA 132* 135  K 5.3* 4.1  CL 95* 100  CO2 32 30  GLUCOSE 513* 397*  BUN 20 20  CREATININE 0.94 0.82  CALCIUM 8.3* 7.7*  AST 15  --   ALT 10  --   ALKPHOS 108  --   BILITOT 0.3  --    ------------------------------------------------------------------------------------------------------------------  Cardiac Enzymes No results for input(s): TROPONINI in the last 168 hours. ------------------------------------------------------------------------------------------------------------------  RADIOLOGY:  Dg Foot Complete Right  Result Date: 05/22/2018 CLINICAL DATA:  Worsening wound to the right foot. 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 Focal cortical scalloping along the lateral fifth metatarsal shaft is again demonstrated without significant change. This is likely postoperative. No definite evidence of bone erosion to suggest osteomyelitis. No evidence of acute fracture or dislocation. No focal  bone lesion or bone destruction. Soft tissue defect on  the plantar aspect of the foot at the level of the metatarsal heads consistent with ulceration. No underlying bone erosions. Prominent vascular calcifications. 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 Persistent cortical scalloping without change, likely postoperative. Soft tissue ulceration over the metatarsal heads. No definite radiographic evidence of osteomyelitis. Electronically Signed   By: Burman Nieves M.D.   On: 05/22/2018 23:52    EKG:   Orders placed or performed during the hospital encounter of 06/05/16  . ED EKG  . ED EKG  . EKG    ASSESSMENT AND PLAN:   62 year old female with past medical history significant for peripheral vascular disease, ongoing smoking, COPD, diabetes presents to hospital secondary to nonhealing bilateral leg wounds.  1.  Bilateral leg wounds-concern for osteomyelitis -Appreciate podiatry consult.  Peripheral vascular disease -For I&D of the right foot tomorrow -Also will need angiogram.  Vascular has been consulted -MRI is pending -Continue IV antibiotics- vanc and zosyn  2.  Diffuse macular rash on the extremities-not itching.  Started 3 days prior to admission.  So could not be from antibiotics -Not on any medications at home. -If not improving, consider dermatology consult  3.  Anemia of chronic disease-stable.  Slight drop noted.  Xarelto is on hold  4. DM- lantus- on hold tomorrow as NPO for procedure SSI  5.  DVT prophylaxis-Xarelto on hold for procedure tomorrow    All the records are reviewed and case discussed with Care Management/Social Workerr. Management plans discussed with the patient, family and they are in agreement.  CODE STATUS: Full code  TOTAL TIME TAKING CARE OF THIS PATIENT: 38 minutes.   POSSIBLE D/C IN 1-2 DAYS, DEPENDING ON CLINICAL CONDITION.   Enid Baas M.D on 05/23/2018 at 3:33 PM  Between 7am to  6pm - Pager - [phone]  After 6pm go to [email or web address] - Social research officer, government  [facility]  Office  [phone]  CC: Primary care physician; Mickel Fuchs, MD

## 2018-05-23 NOTE — ED Notes (Signed)
Pt to the er for pain and infection to the right foot. Pt is diabetic and had a toe amputation. Pt has a home health nurse that comes out to see her and told her she needed to come in. Pt denies fever at home. Pt has swelling to the right foot, and purulent drainage to the wound. Pt also has broken out in a rash over both arms and legs with no pain or itching. Pt denies any allergies or recent antibiotics.

## 2018-05-23 NOTE — Progress Notes (Signed)
Initial Nutrition Assessment  DOCUMENTATION CODES:   Severe malnutrition in context of chronic illness  INTERVENTION:   Premier Protein BID, each supplement provides 160 kcal and 30 grams of protein.   Ocuvite daily for wound healing (provides zinc, vitamin A, vitamin C, Vitamin E, copper, and selenium)  Vitamin C 500mg  po BID  NUTRITION DIAGNOSIS:   Severe Malnutrition related to chronic illness(COPD, uncontrolled DM) as evidenced by 25 percent weight loss in 9 months, severe fat depletion, severe muscle depletion.  GOAL:   Patient will meet greater than or equal to 90% of their needs  MONITOR:   PO intake, Supplement acceptance, Labs, Weight trends, Skin, I & O's  REASON FOR ASSESSMENT:   Other (Comment)(low BMI)    ASSESSMENT:   62 y.o. female with a known history of peripheral vascular disease, tobacco abuse, COPD, diabetes type 2 and nonhealing wounds on her feet.   RD familiar with this patient from previous admit. Pt reports good appetite and oral intake pta. Pt eating fairly well during last admit and has just been initiated on a diet today; pt tends to have good appetite but does not eat large quantities. Per chart, pt has lost 23lbs(25%) over the past 10 months which is severe. Pt does appear to have gained some weight since admit; unsure if this is r/t edema. Pt likes chocolate Premier Protein and was drinking these during last admit. RD will order vitamins to encourage wound healing. Pt will need vitamin C supplementation after discharge until wounds heal. Highly suspect pt with scurvy giving her poor po intake, chronic smoking and non healing wounds.   Medications reviewed and include: aspirin, colace, insulin, protonix, NaCl @75ml /hr, zosyn vancomycin   Labs reviewed: wbc- 12.4(H), Hgb 9.3(L), Hct 29.5(L) AIC 10.3- 10/9 cbgs- 431, 429, 143 x 24 hrs  NUTRITION - FOCUSED PHYSICAL EXAM:    Most Recent Value  Orbital Region  Moderate depletion  Upper Arm  Region  Severe depletion  Thoracic and Lumbar Region  Severe depletion  Buccal Region  Moderate depletion  Temple Region  Moderate depletion  Clavicle Bone Region  Severe depletion  Clavicle and Acromion Bone Region  Severe depletion  Scapular Bone Region  Moderate depletion  Dorsal Hand  Moderate depletion  Patellar Region  Severe depletion  Anterior Thigh Region  Severe depletion  Posterior Calf Region  Severe depletion  Edema (RD Assessment)  Mild  Hair  Reviewed  Eyes  Reviewed  Mouth  Reviewed  Skin  Reviewed  Nails  Reviewed     Diet Order:   Diet Order            Diet NPO time specified  Diet effective midnight        Diet Carb Modified Fluid consistency: Thin; Room service appropriate? Yes  Diet effective now             EDUCATION NEEDS:   Education needs have been addressed  Skin:  Skin Assessment: Reviewed RN Assessment(non healing wounds on bilateral feet )  Last BM:  11/21  Height:   Ht Readings from Last 1 Encounters:  05/22/18 5\' 2"  (1.575 m)    Weight:   Wt Readings from Last 1 Encounters:  05/22/18 37.2 kg    Ideal Body Weight:  50 kg  BMI:  Body mass index is 15 kg/m.  Estimated Nutritional Needs:   Kcal:  1200-1400kcal/day   Protein:  55-63g/day   Fluid:  1L/day   Betsey Holidayasey Kanesha Cadle MS, RD, LDN Pager #-  [phone] Office#- [phone] After Hours Pager: [phone]

## 2018-05-23 NOTE — Progress Notes (Signed)
Pharmacy Antibiotic Note  April [name] is a 62 y.o. female admitted on 05/22/2018 with wound infection.  Pharmacy has been consulted for vanc/zosyn dosing. Patient received vanc 1g, zosyn 3.375g, ceftriaxone 2g IV x 1  Plan: Will continue vanc 500 mg IV q24h   Will draw trough 11/25 @ 2200 Will continue zosyn 3.375g IV q8h  Ke 0.0342 T1/2 ~ 24 hrs Goal trough 15 - 20 mcg/mL  Height: 5\' 2"  (157.5 cm) Weight: 82 lb (37.2 kg) IBW/kg (Calculated) : 50.1  Temp (24hrs), Avg:97.6 F (36.4 C), Min:97.5 F (36.4 C), Max:97.6 F (36.4 C)  Recent Labs  Lab 05/22/18 2124 05/22/18 2342 05/23/18 0426 05/23/18 0520  WBC 15.6*  --  12.4*  --   CREATININE 0.94  --  0.82  --   LATICACIDVEN 2.5* 1.8  --  1.7    Estimated Creatinine Clearance: 41.2 mL/min (by C-G formula based on SCr of 0.82 mg/dL).    Allergies  Allergen Reactions  . Eggs Or Egg-Derived Products Diarrhea, Itching and Nausea And Vomiting    Thank you for allowing pharmacy to be a part of this patient's care.  Thomasene Rippleavid Yale Golla, PharmD, BCPS Clinical Pharmacist 05/23/2018

## 2018-05-23 NOTE — Consult Note (Signed)
[facility] Vascular Consult Note  MRN : [id number]  FRIMET DURFEE is a 62 y.o. (09-15-1955) female who presents with chief complaint of  Chief Complaint  Patient presents with  . Wound Check   History of Present Illness:  The patient is a 62 year old female with a past medical history of DVT, diabetes, COPD, hyperlipidemia, anxiety, tobacco abuse, peripheral artery disease with chronic wounds/ulcerations to the bilateral legs.  Patient was sent to the [facility] emergency department after visit from her visiting home nurse due " her foot wound getting worse".  Patient is not a good historian and most of the information for this consult was obtained by previous epic notation.  Seems that the patient's bilateral lower extremity foot wounds have worsened over the last week.  They also noted a new rash which covers her entire body.  This rash seems to be asymptomatic.  The patient does continue to smoke 1 to 1-1/2 packs of cigarettes a day and she is noncompliant with her medications including her insulin.  The patient does note increased pain to her bilateral feet.  The patient does not ambulate much but when she does she states that her feet do hurt.  The patient denies any fever, nausea vomiting.  Patient denies any shortness of breath or chest pain.  X-ray of right foot conducted on May 25, 2018 "postoperative right fifth amputation at the level of the mid-metatarsal. Persistent cortical scalloping without change, likely postoperative. Soft tissue ulceration over the metatarsal heads. No definite radiographic evidence of osteomyelitis.  Awaiting MRI results.  The patient was admitted and started on IV antibiotics.   The patient is on the schedule with podiatry tomorrow for a right foot incision and drainage.  Vascular surgery was consulted by Dr. Orland Jarred for possible endovascular intervention to the bilateral legs.  Current  Facility-Administered Medications  Medication Dose Route Frequency Provider Last Rate Last Dose  . 0.9 %  sodium chloride infusion   Intravenous Continuous Cammy Copa, MD 75 mL/hr at 05/23/18 0340    . acetaminophen (TYLENOL) tablet 650 mg  650 mg Oral Q6H PRN Cammy Copa, MD       Or  . acetaminophen (TYLENOL) suppository 650 mg  650 mg Rectal Q6H PRN Cammy Copa, MD      . albuterol (PROVENTIL) (2.5 MG/3ML) 0.083% nebulizer solution 2.5 mg  2.5 mg Inhalation Q6H PRN Cammy Copa, MD      . aspirin chewable tablet 81 mg  81 mg Oral Daily Cammy Copa, MD      . atorvastatin (LIPITOR) tablet 40 mg  40 mg Oral Daily Cammy Copa, MD      . bisacodyl (DULCOLAX) EC tablet 5 mg  5 mg Oral Daily PRN Cammy Copa, MD      . docusate sodium (COLACE) capsule 100 mg  100 mg Oral BID Cammy Copa, MD      . fluticasone furoate-vilanterol (BREO ELLIPTA) 200-25 MCG/INH 1 puff  1 puff Inhalation Daily Cammy Copa, MD   1 puff at 05/23/18 0854  . guaiFENesin-dextromethorphan (ROBITUSSIN DM) 100-10 MG/5ML syrup 5 mL  5 mL Oral Q4H PRN Cammy Copa, MD   5 mL at 05/23/18 0851  . HYDROcodone-acetaminophen (NORCO/VICODIN) 5-325 MG per tablet 1-2 tablet  1-2 tablet Oral Q4H PRN Cammy Copa, MD   2 tablet at 05/23/18 0851  . insulin aspart (novoLOG) injection 0-5 Units  0-5 Units Subcutaneous QHS Cammy Copa, MD      .  insulin aspart (novoLOG) injection 0-9 Units  0-9 Units Subcutaneous TID WC Cammy Copa, MD   1 Units at 05/23/18 [phone]  . insulin glargine (LANTUS) injection 15 Units  15 Units Subcutaneous Daily Cammy Copa, MD   15 Units at 05/23/18 [phone]  . multivitamin-lutein (OCUVITE-LUTEIN) capsule 1 capsule  1 capsule Oral Daily Enid Baas, MD      . ondansetron [facility]) tablet 4 mg  4 mg Oral Q6H PRN Cammy Copa, MD       Or  . ondansetron [facility]) injection 4 mg  4 mg Intravenous Q6H PRN Cammy Copa, MD      . oxyCODONE-acetaminophen (PERCOCET/ROXICET) 5-325 MG per tablet 1  tablet  1 tablet Oral Q6H PRN Cammy Copa, MD       And  . oxyCODONE (Oxy IR/ROXICODONE) immediate release tablet 5 mg  5 mg Oral Q6H PRN Cammy Copa, MD      . pantoprazole (PROTONIX) EC tablet 40 mg  40 mg Oral BID Cammy Copa, MD   40 mg at 05/23/18 0851  . piperacillin-tazobactam (ZOSYN) IVPB 3.375 g  3.375 g Intravenous Q8H Cammy Copa, MD   Stopped at 05/23/18 [phone]  . protein supplement (PREMIER PROTEIN) liquid - approved for s/p bariatric surgery  11 oz Oral BID BM Enid Baas, MD      . rivaroxaban Carlena Hurl) tablet 10 mg  10 mg Oral Q supper Cammy Copa, MD      . sodium hypochlorite (DAKIN'S 1/4 STRENGTH) topical solution 1 application  1 application Topical Daily Cammy Copa, MD      . tiotropium [facility]) inhalation capsule ([facility] use ONLY) 18 mcg  18 mcg Inhalation Daily Cammy Copa, MD   18 mcg at 05/23/18 0853  . traZODone (DESYREL) tablet 25 mg  25 mg Oral QHS PRN Cammy Copa, MD      . vancomycin (VANCOCIN) 500 mg in sodium chloride 0.9 % 100 mL IVPB  500 mg Intravenous Q24H Cammy Copa, MD      . vitamin C (ASCORBIC ACID) tablet 500 mg  500 mg Oral BID Enid Baas, MD       Past Medical History:  Diagnosis Date  . Anxiety   . Colon polyps   . COPD (chronic obstructive pulmonary disease) (HCC)   . Diabetes mellitus without complication (HCC)   . DVT (deep venous thrombosis) (HCC)   . DVT (deep venous thrombosis) (HCC)   . History of colonic polyps   . Hyperlipidemia   . Peripheral artery disease (HCC)   . Pulmonary nodules/lesions, multiple 08/2012   NEGATIVE PET SCAN  . Retroperitoneal abscess (HCC)    history of in 04/2013  . Tobacco abuse    Past Surgical History:  Procedure Laterality Date  . CHOLECYSTECTOMY    . COLONOSCOPY  05/2007   Social History Social History   Tobacco Use  . Smoking status: Current Every Day Smoker    Packs/day: 1.50    Years: 47.00    Pack years: 70.50    Types: Cigarettes  . Smokeless tobacco:  Never Used  . Tobacco comment: Smoking History 2.5-started at age 49  Substance Use Topics  . Alcohol use: No    Alcohol/week: 0.0 standard drinks  . Drug use: No   Family History Family History  Problem Relation Age of Onset  . Brain cancer Mother   Denies any family history of peripheral artery disease, venous disease or bleeding/clotting disorder.  Allergies  Allergen Reactions  . Eggs Or Egg-Derived Products Diarrhea, Itching  and Nausea And Vomiting   REVIEW OF SYSTEMS (Negative unless checked)  Constitutional: [] Weight loss  [] Fever  [] Chills Cardiac: [] Chest pain   [] Chest pressure   [] Palpitations   [] Shortness of breath when laying flat   [] Shortness of breath at rest   [] Shortness of breath with exertion. Vascular:  [x] Pain in legs with walking   [x] Pain in legs at rest   [x] Pain in legs when laying flat   [] Claudication   [] Pain in feet when walking  [x] Pain in feet at rest  [] Pain in feet when laying flat   [] History of DVT   [] Phlebitis   [] Swelling in legs   [] Varicose veins   [] Non-healing ulcers Pulmonary:   [] Uses home oxygen   [] Productive cough   [] Hemoptysis   [] Wheeze  [x] COPD   [] Asthma Neurologic:  [] Dizziness  [] Blackouts   [] Seizures   [] History of stroke   [] History of TIA  [] Aphasia   [] Temporary blindness   [] Dysphagia   [] Weakness or numbness in arms   [] Weakness or numbness in legs Musculoskeletal:  [] Arthritis   [] Joint swelling   [] Joint pain   [] Low back pain Hematologic:  [] Easy bruising  [] Easy bleeding   [] Hypercoagulable state   [] Anemic  [] Hepatitis Gastrointestinal:  [] Blood in stool   [] Vomiting blood  [] Gastroesophageal reflux/heartburn   [] Difficulty swallowing. Genitourinary:  [] Chronic kidney disease   [] Difficult urination  [] Frequent urination  [] Burning with urination   [] Blood in urine Skin:  [x] Rashes   [x] Ulcers   [x] Wounds Psychological:  [] History of anxiety   []  History of major depression.  Physical Examination  Vitals:   05/23/18  0100 05/23/18 0120 05/23/18 0251 05/23/18 0759  BP: 119/69 111/63 119/61 (!) 99/51  Pulse: 86 83 97 74  Resp: 18 18 19 20   Temp:   (!) 97.5 F (36.4 C) 98.2 F (36.8 C)  TempSrc:   Oral Oral  SpO2: 100% 98% 100% 100%  Weight:      Height:       Body mass index is 15 kg/m. Gen:  WD/WN, NAD Head: [place]/AT, No temporalis wasting. Prominent temp pulse not noted. Ear/Nose/Throat: Hearing grossly intact, nares w/o erythema or drainage, oropharynx w/o Erythema/Exudate. Poor dentition.  Eyes: Sclera non-icteric, conjunctiva clear Neck: Trachea midline.  No JVD.  Pulmonary:  Good air movement, respirations not labored, equal bilaterally.  Cardiac: RRR, normal S1, S2. Vascular:  Vessel Right Left  Radial Palpable Palpable  Ulnar Palpable Palpable  Brachial Palpable Palpable  Carotid Palpable, without bruit Palpable, without bruit  Aorta Not palpable N/A  Femoral Palpable Palpable  Popliteal Palpable Palpable  PT Non-Palpable Non-Palpable  DP Non-Palpable Non-Palpable   Right Lower Extremity: Unable to palpate pedal pulses. Ulceration and cellulitis noted. Left Lower Extremity: Unable to palpate pedal pulses. Necrotic eschar on heel.   Gastrointestinal: soft, non-tender/non-distended. No guarding/reflex.  Musculoskeletal: M/S 5/5 throughout.   Neurologic: Sensation grossly intact in extremities.  Symmetrical.  Speech is fluent. Motor exam as listed above. Psychiatric: Judgment intact, Mood & affect appropriate for pt's clinical situation. Dermatologic: As above Lymph : No Cervical, Axillary, or Inguinal lymphadenopathy.  CBC Lab Results  Component Value Date   WBC 12.4 (H) 05/23/2018   HGB 9.3 (L) 05/23/2018   HCT 29.5 (L) 05/23/2018   MCV 95.8 05/23/2018   PLT 305 05/23/2018   BMET    Component Value Date/Time   NA 135 05/23/2018 0426   NA 142 10/14/2014 0644   K 4.1 05/23/2018 0426   K 3.6 10/14/2014  0644   CL 100 05/23/2018 0426   CL 109 10/14/2014 0644   CO2 30  05/23/2018 0426   CO2 29 10/14/2014 0644   GLUCOSE 397 (H) 05/23/2018 0426   GLUCOSE 142 (H) 10/14/2014 0644   BUN 20 05/23/2018 0426   BUN 9 10/14/2014 0644   CREATININE 0.82 05/23/2018 0426   CREATININE 0.53 10/14/2014 0644   CALCIUM 7.7 (L) 05/23/2018 0426   CALCIUM 7.7 (L) 10/14/2014 0644   GFRNONAA >60 05/23/2018 0426   GFRNONAA >60 10/14/2014 0644   GFRAA >60 05/23/2018 0426   GFRAA >60 10/14/2014 [id number]   Estimated Creatinine Clearance: 41.2 mL/min (by C-G formula based on SCr of 0.82 mg/dL).  COAG Lab Results  Component Value Date   INR 0.96 06/15/2015   INR 1.0 10/25/2014   INR 1.7 10/15/2014   Radiology Dg Foot Complete Right  Result Date: 05/22/2018 CLINICAL DATA:  Worsening wound to the right foot. 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 Focal cortical scalloping along the lateral fifth metatarsal shaft is again demonstrated without significant change. This is likely postoperative. No definite evidence of bone erosion to suggest osteomyelitis. No evidence of acute fracture or dislocation. No focal bone lesion or bone destruction. Soft tissue defect on the plantar aspect of the foot at the level of the metatarsal heads consistent with ulceration. No underlying bone erosions. Prominent vascular calcifications. 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 Persistent cortical scalloping without change, likely postoperative. Soft tissue ulceration over the metatarsal heads. No definite radiographic evidence of osteomyelitis. Electronically Signed   By: Burman NievesWilliam  Stevens M.D.   On: 05/22/2018 23:52   Assessment/Plan The patient is a 62 year old female with a past medical history of DVT, diabetes, COPD, hyperlipidemia, anxiety, tobacco abuse, peripheral artery disease with chronic wounds/ulcerations to the bilateral legs. 1. Atherosclerotic Disease:  Patient with multiple risk factors for peripheral artery disease.  Patient known to be noncompliant with her medications.  The patient is an active tobacco user smoking 1 to 1.5 packs of cigarettes a day.  Unable to palpate pedal pulses to the bilateral feet.  Chronic nonhealing ulcerations bilateral feet.  Reommend a right lower extremity angiogram (as this extremity is worse compared to the left on physical examination with possible osteomyelitis) with possible intervention to assess the patient's anatomy and degree of peripheral artery disease.  If appropriate an attempt to revascularize the leg can be made at that time.  Procedure, risks and benefits explained to the patient.  Questions answered.  We will then follow with the left lower extremity as the patient also has absent pedal pulses and anulcer.  Right lower extremity angiogram will be planned with Dr. Gilda CreaseSchnier on Tuesday 11/26.   2. Tobacco Abuse: We had a discussion for approximately five minutes regarding the absolute need for smoking cessation due to the deleterious nature of tobacco on the vascular system. We discussed the tobacco use would diminish patency of any intervention, and likely significantly worsen progressio of disease. We discussed multiple agents for quitting including replacement therapy or medications to reduce cravings such as Chantix. The patient voices their understanding of the importance of smoking cessation. 3. Hyperlipidemia: On ASA and statin. Encouraged good control as its slows the progression of atherosclerotic disease 4. Diabetes: On appropriate medications. Encouraged good control as its slows the progression of atherosclerotic disease.  Discussed with Dr. Charlie PitterSchnier  Avanti Jetter  A Kindal Ponti, PA-C  05/23/2018 10:47 AM  This note was created with Dragon medical transcription system.  Any error is purely unintentional.

## 2018-05-23 NOTE — ED Notes (Signed)
Family at bedside. 

## 2018-05-23 NOTE — Progress Notes (Signed)
Inpatient Diabetes Program Recommendations  AACE/ADA: New Consensus Statement on Inpatient Glycemic Control (2015)  Target Ranges:  Prepandial:   less than 140 mg/dL      Peak postprandial:   less than 180 mg/dL (1-2 hours)      Critically ill patients:  140 - 180 mg/dL   Results for April [name], April [name] (MRN [id number]) as of 05/23/2018 09:10  Ref. Range 05/23/2018 01:40 05/23/2018 03:19 05/23/2018 07:57  Glucose-Capillary Latest Ref Range: 70 - 99 mg/dL 784431 (H)  20 units LANTUS 429 (H)  10 units NOVOLOG  143 (H)  1 unit NOVOLOG +  15 units LANTUS   Results for April [name], April [name] (MRN [id number]) as of 05/23/2018 09:10  Ref. Range 04/09/2018 00:52  Hemoglobin A1C Latest Ref Range: 4.8 - 5.6 % 10.3 (H)    Admit with: Sepsis likely secondary to infected foot wounds  History: DM, COPD, PVD, Nonhealing Foot Wounds  Home DM Meds: Glipizide 5 mg daily       Novolog 7 units TID with meals       Levemir 10 units QHS       Home Meds information retrieved from Office Visit note from Dr. Alberteen Spindleline on 04/18/2018  Current Orders: Lantus 15 units daily      Novolog Sensitive Correction Scale/ SSI (0-9 units) TID AC + HS      Per H&P notes, patient noncompliant with meds (including insulin).    Counseled at length by the Diabetes Coordinator back on 04/10/2018 (see Chart review for details of conversation).  Per that note, pt told the DM Coordinator she would start checking her CBGs and taking her insulin as prescribed, however, it appears that the patient did not follow through with this.  Note Lantus and Novolog have been started.  CBG much improved this AM: 143 mg/dl.     --Will follow patient during hospitalization--  Ambrose FinlandJeannine Johnston Adante Courington RN, MSN, CDE Diabetes Coordinator Inpatient Glycemic Control Team Team Pager: [phone] (8a-5p)

## 2018-05-24 ENCOUNTER — Inpatient Hospital Stay: Payer: Medicare HMO

## 2018-05-24 ENCOUNTER — Encounter: Payer: Self-pay | Admitting: Podiatry

## 2018-05-24 DIAGNOSIS — J9602 Acute respiratory failure with hypercapnia: Secondary | ICD-10-CM

## 2018-05-24 DIAGNOSIS — J9601 Acute respiratory failure with hypoxia: Secondary | ICD-10-CM

## 2018-05-24 LAB — URINALYSIS, COMPLETE (UACMP) WITH MICROSCOPIC
BACTERIA UA: NONE SEEN
Bilirubin Urine: NEGATIVE
Glucose, UA: >=500 mg/dL — AB
Ketones, ur: NEGATIVE mg/dL
Nitrite: NEGATIVE
PH: 5 (ref 5.0–8.0)
PROTEIN: 100 mg/dL — AB
SPECIFIC GRAVITY, URINE: 1.018 (ref 1.005–1.030)
SQUAMOUS EPITHELIAL / LPF: NONE SEEN (ref 0–5)

## 2018-05-24 LAB — HEPARIN LEVEL (UNFRACTIONATED)
HEPARIN UNFRACTIONATED: 0.21 [IU]/mL — AB (ref 0.30–0.70)
HEPARIN UNFRACTIONATED: 0.41 [IU]/mL (ref 0.30–0.70)

## 2018-05-24 LAB — CBC
HCT: 30.3 % — ABNORMAL LOW (ref 36.0–46.0)
Hemoglobin: 9.3 g/dL — ABNORMAL LOW (ref 12.0–15.0)
MCH: 30.2 pg (ref 26.0–34.0)
MCHC: 30.7 g/dL (ref 30.0–36.0)
MCV: 98.4 fL (ref 80.0–100.0)
NRBC: 0 % (ref 0.0–0.2)
PLATELETS: 315 10*3/uL (ref 150–400)
RBC: 3.08 MIL/uL — AB (ref 3.87–5.11)
RDW: 12.6 % (ref 11.5–15.5)
WBC: 15.8 10*3/uL — AB (ref 4.0–10.5)

## 2018-05-24 LAB — BLOOD GAS, ARTERIAL
ACID-BASE DEFICIT: 3.8 mmol/L — AB (ref 0.0–2.0)
Bicarbonate: 24.6 mmol/L (ref 20.0–28.0)
FIO2: 0.36
O2 Saturation: 83.4 %
PH ART: 7.22 — AB (ref 7.350–7.450)
Patient temperature: 37
pCO2 arterial: 60 mmHg — ABNORMAL HIGH (ref 32.0–48.0)
pO2, Arterial: 58 mmHg — ABNORMAL LOW (ref 83.0–108.0)

## 2018-05-24 LAB — GLUCOSE, CAPILLARY
GLUCOSE-CAPILLARY: 175 mg/dL — AB (ref 70–99)
GLUCOSE-CAPILLARY: 182 mg/dL — AB (ref 70–99)
GLUCOSE-CAPILLARY: 472 mg/dL — AB (ref 70–99)
Glucose-Capillary: 146 mg/dL — ABNORMAL HIGH (ref 70–99)
Glucose-Capillary: 198 mg/dL — ABNORMAL HIGH (ref 70–99)
Glucose-Capillary: 138 mg/dL — ABNORMAL HIGH (ref 70–99)
Glucose-Capillary: 491 mg/dL — ABNORMAL HIGH (ref 70–99)

## 2018-05-24 LAB — URINALYSIS, ROUTINE W REFLEX MICROSCOPIC
Bacteria, UA: NONE SEEN
Bilirubin Urine: NEGATIVE
Ketones, ur: NEGATIVE mg/dL
Nitrite: NEGATIVE
PH: 5 (ref 5.0–8.0)
Protein, ur: 100 mg/dL — AB
Specific Gravity, Urine: 1.025 (ref 1.005–1.030)
WBC, UA: >50 WBC/hpf — ABNORMAL HIGH (ref 0–5)

## 2018-05-24 LAB — BASIC METABOLIC PANEL
Anion gap: 8 (ref 5–15)
BUN: 27 mg/dL — AB (ref 8–23)
CO2: 26 mmol/L (ref 22–32)
Calcium: 8 mg/dL — ABNORMAL LOW (ref 8.9–10.3)
Chloride: 102 mmol/L (ref 98–111)
Creatinine, Ser: 1 mg/dL (ref 0.44–1.00)
GFR calc Af Amer: >60 mL/min (ref >60)
GFR, EST NON AFRICAN AMERICAN: 59 mL/min — AB (ref >60)
Glucose, Bld: 132 mg/dL — ABNORMAL HIGH (ref 70–99)
POTASSIUM: 4.9 mmol/L (ref 3.5–5.1)
SODIUM: 136 mmol/L (ref 135–145)

## 2018-05-24 LAB — APTT: aPTT: 34 seconds (ref 24–36)

## 2018-05-24 LAB — MRSA PCR SCREENING: MRSA by PCR: NEGATIVE

## 2018-05-24 MED ORDER — DEXTROSE-NACL 5-0.45 % IV SOLN
INTRAVENOUS | Status: DC
  Administered 2018-05-25: 05:00:00 via INTRAVENOUS

## 2018-05-24 MED ORDER — SODIUM CHLORIDE 0.9 % IV BOLUS
1000.0000 mL | Freq: Once | INTRAVENOUS | Status: AC
  Administered 2018-05-24: 1000 mL via INTRAVENOUS

## 2018-05-24 MED ORDER — DEXTROSE 50 % IV SOLN: 25.0000 mL | INTRAVENOUS | Status: DC | PRN

## 2018-05-24 MED ORDER — HEPARIN (PORCINE) 25000 UT/250ML-% IV SOLN
850.0000 [IU]/h | INTRAVENOUS | Status: DC
  Administered 2018-05-24: 750 [IU]/h via INTRAVENOUS
  Administered 2018-05-25: 850 [IU]/h via INTRAVENOUS
  Filled 2018-05-24 (×2): qty 250

## 2018-05-24 MED ORDER — PHENAZOPYRIDINE HCL 200 MG PO TABS
200.0000 mg | ORAL_TABLET | Freq: Once | ORAL | Status: AC
  Administered 2018-05-24: 200 mg via ORAL
  Filled 2018-05-24: qty 1

## 2018-05-24 MED ORDER — HEPARIN (PORCINE) 25000 UT/250ML-% IV SOLN: 10.0000 [IU]/kg/h | INTRAVENOUS | Status: DC

## 2018-05-24 MED ORDER — SODIUM CHLORIDE 0.9 % IV SOLN
INTRAVENOUS | Status: DC
  Administered 2018-05-25 (×3): via INTRAVENOUS

## 2018-05-24 MED ORDER — INSULIN REGULAR BOLUS VIA INFUSION
0.0000 [IU] | Freq: Three times a day (TID) | INTRAVENOUS | Status: DC
  Administered 2018-05-25: 4.4 [IU] via INTRAVENOUS
  Filled 2018-05-24: qty 10

## 2018-05-24 MED ORDER — FUROSEMIDE 10 MG/ML IJ SOLN
20.0000 mg | Freq: Once | INTRAMUSCULAR | Status: AC
  Administered 2018-05-24: 20 mg via INTRAVENOUS
  Filled 2018-05-24: qty 2

## 2018-05-24 MED ORDER — HEPARIN BOLUS VIA INFUSION
2200.0000 [IU] | Freq: Once | INTRAVENOUS | Status: AC
  Administered 2018-05-24: 2200 [IU] via INTRAVENOUS
  Filled 2018-05-24: qty 2200

## 2018-05-24 MED ORDER — METHYLPREDNISOLONE SODIUM SUCC 125 MG IJ SOLR
60.0000 mg | Freq: Three times a day (TID) | INTRAMUSCULAR | Status: DC
  Administered 2018-05-24 – 2018-05-26 (×6): 60 mg via INTRAVENOUS
  Filled 2018-05-24 (×6): qty 2

## 2018-05-24 MED ORDER — FUROSEMIDE 10 MG/ML IJ SOLN
20.0000 mg | Freq: Once | INTRAMUSCULAR | Status: AC
  Administered 2018-05-24: 20 mg via INTRAVENOUS

## 2018-05-24 MED ORDER — INSULIN REGULAR(HUMAN) IN NACL 100-0.9 UT/100ML-% IV SOLN
INTRAVENOUS | Status: DC
  Administered 2018-05-24: 4.3 [IU]/h via INTRAVENOUS
  Administered 2018-05-25: 6.1 [IU]/h via INTRAVENOUS
  Filled 2018-05-24 (×2): qty 100

## 2018-05-24 MED ORDER — FUROSEMIDE 10 MG/ML IJ SOLN
INTRAMUSCULAR | Status: AC
  Filled 2018-05-24: qty 4

## 2018-05-24 NOTE — Progress Notes (Signed)
[employer] [facility] [place] [facility]   PATIENT NAME: April [name]    MR#:  [id number]  DATE OF BIRTH:  April 22, 1956  SUBJECTIVE:  CHIEF COMPLAINT:   Chief Complaint  Patient presents with  . Wound Check   Rapid response was called this morning Patient was found to be acutely short of breath and hypoxic Oxygen saturation dropped and patient was put on oxygen via nonrebreather mask initially She was given IV Lasix for diuresis Chest x-ray revealed pulmonary congestion and edema ABG revealed elevated CO2 level and patient was put on BiPAP and transferred to stepdown unit  REVIEW OF SYSTEMS:  Review of Systems  Constitutional: Tachycardic and short of breath HENT: Negative.   Eyes: Negative.   Respiratory: Shortness of breath, wheezing Cardiovascular: Palpitations Gastrointestinal: Negative.   Genitourinary: Negative.   Musculoskeletal: Negative.   Skin: Right foot wound Neurological: Negative.   Endo/Heme/Allergies: Negative.   Psychiatric/Behavioral: Negative.   All other systems reviewed and are negative.  DRUG ALLERGIES:   Allergies  Allergen Reactions  . Eggs Or Egg-Derived Products Diarrhea, Itching and Nausea And Vomiting    VITALS:  Blood pressure 98/65, pulse 99, temperature (!) 96.7 F (35.9 C), temperature source Axillary, resp. rate (!) 21, height 5\' 2"  (1.575 m), weight 44.3 kg, SpO2 99 %.  PHYSICAL EXAMINATION:  Physical Exam  GENERAL:  62 y.o.-year-old patient lying in the bed with no acute distress.  Malnourished EYES: Pupils equal, round, reactive to light and accommodation. No scleral icterus. Extraocular muscles intact.  HEENT: Head atraumatic, normocephalic. Oropharynx and nasopharynx clear.  NECK:  Supple, no jugular venous distention. No thyroid enlargement, no tenderness.  LUNGS: Decreased breath sounds bilaterally, bibasilar crepitations heard. On BiPAP CARDIOVASCULAR: S1, S2 tachycardia noted. No murmurs, rubs, or  gallops.  ABDOMEN: Soft, nontender, nondistended. Bowel sounds present. No organomegaly or mass.  EXTREMITIES: No pedal edema, cyanosis, or clubbing.  Unable to palpate dorsalis pedis pulses. Ischemic ulcer left heel, also ulcer on the plantar surface of fifth metatarsal NEUROLOGIC: Cranial nerves II through XII are intact. Muscle strength 5/5 in all extremities. Sensation intact. Gait not checked.  PSYCHIATRIC: The patient is alert and oriented x 3.  SKIN: Diffuse macular rash noted on upper and lower extremities including the palms.  Denies any itching   LABORATORY PANEL:   CBC Recent Labs  Lab 05/24/18 0451  WBC 15.8*  HGB 9.3*  HCT 30.3*  PLT 315   ------------------------------------------------------------------------------------------------------------------  Chemistries  Recent Labs  Lab 05/22/18 2124  05/24/18 0451  NA 132*   < > 136  K 5.3*   < > 4.9  CL 95*   < > 102  CO2 32   < > 26  GLUCOSE 513*   < > 132*  BUN 20   < > 27*  CREATININE 0.94   < > 1.00  CALCIUM 8.3*   < > 8.0*  AST 15  --   --   ALT 10  --   --   ALKPHOS 108  --   --   BILITOT 0.3  --   --    < > = values in this interval not displayed.   ------------------------------------------------------------------------------------------------------------------  Cardiac Enzymes No results for input(s): TROPONINI in the last 168 hours. ------------------------------------------------------------------------------------------------------------------  RADIOLOGY:  Mr Foot Right Wo Contrast  Result Date: 05/24/2018 CLINICAL DATA:  Bilateral forefoot pain. History of right toe amputation. Noncompliant diabetic. EXAM: MRI OF THE RIGHT FOREFOOT WITHOUT CONTRAST TECHNIQUE: Multiplanar, multisequence MR imaging  of the right forefoot was performed. No intravenous contrast was administered. COMPARISON:  Radiographs 05/22/2018, 04/08/2018 and 05/17/2017. FINDINGS: Bones/Joint/Cartilage Previous amputation  through the 5th metatarsal shaft. The remaining 5th metatarsal demonstrates no abnormal signal. There is no T2 hyperintensity, abnormal T1 signal or cortical destruction throughout the additional bones of the forefoot. The tibial sesamoid of the 1st metatarsal is bipartite. There are no significant joint effusions or arthropathic changes. Ligaments Intact Lisfranc ligament. Muscles and Tendons Mild T2 hyperintensity throughout the forefoot musculature, likely related to underlying diabetes. No significant tenosynovitis. Soft tissues There is generalized soft tissue edema, especially within the dorsal subcutaneous fat. There are multiple areas of apparent skin ulceration involving the medial forefoot, predominately plantar and medial to the 1st metatarsophalangeal joint. There are inflammatory changes within the adjacent soft tissues, but no focal fluid collection on noncontrast imaging. IMPRESSION: 1. Areas of skin ulceration medial and plantar to the 1st metatarsophalangeal joint without evidence of focal abscess. 2. Generalized soft tissue edema may relate to cellulitis. 3. No evidence of osteomyelitis or septic joint. 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 Electronically Signed   By: Carey Bullocks M.D.   On: 05/24/2018 08:10   Mr Foot Left Wo Contrast  Result Date: 05/24/2018 CLINICAL DATA:  Bilateral forefoot pain. History of right toe amputation. Noncompliant diabetic. EXAM: MRI OF THE LEFT FOOT WITHOUT CONTRAST TECHNIQUE: Multiplanar, multisequence MR imaging of the left forefoot was performed. No intravenous contrast was administered. COMPARISON:  None. FINDINGS: Bones/Joint/Cartilage There is mild T2 hyperintensity within the marrow of the distal 3rd phalanx. There is no abnormal T1 signal or cortical destruction. The visualized bones are otherwise normal. There are no significant joint effusions or arthropathic changes. Ligaments The Lisfranc ligament is intact. Muscles and Tendons  There is mild generalized T2 hyperintensity throughout the forefoot musculature. There is a small amount fluid within the flexor tendon sheaths. Soft tissues Generalized soft tissue edema, especially within the dorsal subcutaneous fat. No focal fluid collection or skin ulceration identified. IMPRESSION: 1. Generalized soft tissue edema may relate to cellulitis. No focal fluid collection or skin ulceration identified. 2. Nonspecific T2 hyperintensity within the distal phalanx of the 3rd toe. This may be secondary to hyperemia. There is no cortical destruction or abnormal T1 signal to confirm osteomyelitis. No adjacent focal soft tissue abnormality or joint effusion. Electronically Signed   By: Carey Bullocks M.D.   On: 05/24/2018 08:04   Dg Chest Port 1 View  Result Date: 05/24/2018 CLINICAL DATA:  Shortness of breath.  History of COPD and diabetes. EXAM: PORTABLE CHEST 1 VIEW COMPARISON:  Radiographs 03/14/2015. FINDINGS: 0741 hours. The heart size and mediastinal contours are stable. There is aortic atherosclerosis. Interval diffusely increased interstitial markings, most consistent with pulmonary edema. There is no confluent airspace opacity, pneumothorax or significant pleural effusion. No acute osseous findings are seen. IMPRESSION: Diffuse interstitial thickening most consistent with interstitial pulmonary edema superimposed on obstructive lung disease. Electronically Signed   By: Carey Bullocks M.D.   On: 05/24/2018 08:14   Dg Foot Complete Right  Result Date: 05/22/2018 CLINICAL DATA:  Worsening wound to the right foot. 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 Focal cortical scalloping along the lateral fifth metatarsal shaft is again demonstrated without significant change. This is likely postoperative. No definite evidence of bone erosion to suggest osteomyelitis. No evidence of acute fracture or  dislocation. No focal bone lesion or  bone destruction. Soft tissue defect on the plantar aspect of the foot at the level of the metatarsal heads consistent with ulceration. No underlying bone erosions. Prominent vascular calcifications. 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 Persistent cortical scalloping without change, likely postoperative. Soft tissue ulceration over the metatarsal heads. No definite radiographic evidence of osteomyelitis. Electronically Signed   By: Burman Nieves M.D.   On: 05/22/2018 23:52    EKG:   Orders placed or performed during the hospital encounter of 06/05/16  . ED EKG  . ED EKG  . EKG    ASSESSMENT AND PLAN:   62 year old female with past medical history significant for peripheral vascular disease, ongoing smoking, COPD, diabetes presents to hospital secondary to nonhealing bilateral leg wounds.  1. Acute hypoxic hypercapnic respiratory failure  Patient transferred to stepdown unit Continue BiPAP for respiratory distress Intensivist consultation  2. Acute pulmonary edema with fluid overload IV Lasix for diuresis Monitor input output Follow-up chest x-rays  3.  Osteomyelitis right foot Left foot wounds -Appreciate podiatry consult.  Peripheral vascular disease -For I&D of the right foot tomorrow if clinically stable -Also will need angiogram.  Vascular has been consulted -MRI foot reviewed -Continue IV antibiotics- vanc and zosyn  4.  Diffuse macular rash on the extremities-not itching.  Started 3 days prior to admission.  So could not be from antibiotics -Not on any medications at home. -Monitor closely  5.  Anemia of chronic disease-stable.  Slight drop noted.  Xarelto is on hold for procedure  6. DM-sliding scale coverage with insulin  7.  DVT prophylaxis-Xarelto on hold for procedure tomorrow    All the records are reviewed and case discussed with Care Management/Social Workerr. Management  plans discussed with the patient, family and they are in agreement.  CODE STATUS: Full code  TOTAL  TIME TAKING CARE OF THIS PATIENT: 37 minutes.   POSSIBLE D/C IN 3-4 DAYS, DEPENDING ON CLINICAL CONDITION.   Ihor Austin M.D on 05/24/2018 at 12:18 PM  Between 7am to 6pm - Pager - [phone]  After 6pm go to [email or web address] - Social research officer, government  [facility]  Office  [phone]  CC: Primary care physician; Mickel Fuchs, MD

## 2018-05-24 NOTE — Progress Notes (Signed)
Patient HR elevated. MD notified. Fluid bolus ordered.

## 2018-05-24 NOTE — Consult Note (Signed)
ANTICOAGULATION CONSULT NOTE   Pharmacy Consult for heparin drip management Indication: VTE prophylaxis  Patient Measurements: Height: 5\' 2"  (157.5 cm) Weight: 97 lb 10.6 oz (44.3 kg) IBW/kg (Calculated) : 50.1  Vital Signs: Temp: 96.7 F (35.9 C) (11/23 0849) Temp Source: Axillary (11/23 0849) BP: 98/65 (11/23 1100) Pulse Rate: 99 (11/23 1100)  Labs: Recent Labs    05/22/18 2124 05/23/18 0426 05/24/18 0451  HGB 11.1* 9.3* 9.3*  HCT 34.7* 29.5* 30.3*  PLT 351 305 315  CREATININE 0.94 0.82 1.00    Estimated Creatinine Clearance: 40.3 mL/min (by C-G formula based on SCr of 1 mg/dL).   Medical History: Past Medical History:  Diagnosis Date  . Anxiety   . Colon polyps   . COPD (chronic obstructive pulmonary disease) (HCC)   . Diabetes mellitus without complication (HCC)   . DVT (deep venous thrombosis) (HCC)   . DVT (deep venous thrombosis) (HCC)   . History of colonic polyps   . Hyperlipidemia   . Peripheral artery disease (HCC)   . Pulmonary nodules/lesions, multiple 08/2012   NEGATIVE PET SCAN  . Retroperitoneal abscess (HCC)    history of in 04/2013  . Tobacco abuse     Assessment: 62 year old female with history of COPD, chronic tobacco abuse, DM, PVD, DVT on Xarelto diabetic foot ulcer.  There was a plan for debridement by podiatry earlier today.  While on the floor she was noticed to have desaturation and increased work of breathing and had to be transferred to intensive care unit because of requirement for titration of BiPAP.  Surgery was put on hold. There is no recent history of heparin administration to guide dosing. Baseline aPTT is 34s and HL is 0.21 IU/mL. Her last dose of Xarelto was last week at an unknown time. Low baseline aPTT indicates that we can use heparin levels to monitor therapy  Goal of Therapy:  Heparin level 0.3-0.7 units/ml aPTT 66-102 seconds Monitor platelets by anticoagulation protocol: Yes   Plan:  Give 2200 units bolus x  1 Start heparin infusion at 750 units/hr Check anti-Xa level in 6 hours and daily while on heparin Continue to monitor H&H and platelets  Lowella Bandyodney D Hermilo Dutter, PharmD 05/24/2018,11:39 AM

## 2018-05-24 NOTE — Progress Notes (Signed)
[facility]                                                      Patient Demographics  April [name], is a 62 y.o. female   MRN: [id number]   DOB - 1956/02/15  Admit Date - 05/22/2018    Outpatient Primary MD for the patient is Wroth, Sydnee Cabalhomas H, MD  Consult requested in the Hospital by Ihor AustinPyreddy, Pavan, MD, On 05/24/2018   With History of -  Past Medical History:  Diagnosis Date  . Anxiety   . Colon polyps   . COPD (chronic obstructive pulmonary disease) (HCC)   . Diabetes mellitus without complication (HCC)   . DVT (deep venous thrombosis) (HCC)   . DVT (deep venous thrombosis) (HCC)   . History of colonic polyps   . Hyperlipidemia   . Peripheral artery disease (HCC)   . Pulmonary nodules/lesions, multiple 08/2012   NEGATIVE PET SCAN  . Retroperitoneal abscess (HCC)    history of in 04/2013  . Tobacco abuse       Past Surgical History:  Procedure Laterality Date  . CHOLECYSTECTOMY    . COLONOSCOPY  05/2007    in for   Chief Complaint  Patient presents with  . Wound Check     HPI  April [name]  is a 62 y.o. female, patient who is diabetic with a chronic wound on the right foot as well as on the left heel.  Right foot has cellulitis with a longstanding open wound.  Left foot has a decubitus ulcer with necrotic eschar.  She is scheduled for surgery this morning but we had to postpone that due to tachycardia and hypoxia.  Being treated for that today.    Review of Systems  : Patient has had episode of tachycardia and hypoxia that has to be stabilized prior to our being able to take her to surgery.  She also states she just generally does not feel well and had little difficulty with communication this morning. Social History Social History   Tobacco Use  . Smoking status: Current Every Day Smoker    Packs/day: 1.50    Years: 47.00     Pack years: 70.50    Types: Cigarettes  . Smokeless tobacco: Never Used  . Tobacco comment: Smoking History 2.5-started at age 62  Substance Use Topics  . Alcohol use: No    Alcohol/week: 0.0 standard drinks    Family History Family History  Problem Relation Age of Onset  . Brain cancer Mother     Prior to Admission medications   Medication Sig Start Date End Date Taking? Authorizing Provider  aspirin 81 MG chewable tablet Chew 81 mg by mouth daily. 05/30/17  Yes [provider]  atorvastatin (LIPITOR) 40 MG tablet Take 40 mg by mouth daily. 10/24/17 10/24/18 Yes [provider]  Fluticasone-Salmeterol (ADVAIR DISKUS) 250-50 MCG/DOSE AEPB Inhale 1 puff into the lungs 2 (two) times daily. 03/14/15 01/16/26 Yes Beers, Charmayne Sheerharles M, PA-C  pantoprazole (PROTONIX) 40 MG tablet Take 40 mg by mouth 2 (two) times daily.  04/25/18  Yes [provider]  rivaroxaban (XARELTO) 10 MG TABS tablet Take 10 mg by mouth every evening.   Yes [provider]  SANTYL ointment Apply 1 application topically daily.  05/19/18  Yes [provider]  sodium  hypochlorite (DAKIN'S 1/4 STRENGTH) 0.125 % SOLN Apply 1 application topically daily. Use as a wound cleanser, pat dry, then apply dressings 12/18/17  Yes [provider]  SPIRIVA HANDIHALER 18 MCG inhalation capsule Place 18 mcg into inhaler and inhale daily.  05/19/18  Yes [provider]  albuterol (PROVENTIL HFA;VENTOLIN HFA) 108 (90 Base) MCG/ACT inhaler Inhale 2 puffs into the lungs every 6 (six) hours as needed for wheezing or shortness of breath.    [provider]  albuterol (PROVENTIL) (2.5 MG/3ML) 0.083% nebulizer solution Inhale 2.5 mg into the lungs every 6 (six) hours as needed for wheezing or shortness of breath.     [provider]  guaiFENesin-dextromethorphan (ROBITUSSIN DM) 100-10 MG/5ML syrup Take 5 mLs by mouth every 4 (four) hours as needed for cough. 04/11/18    Milagros Loll, MD  oxyCODONE-acetaminophen (PERCOCET) 10-325 MG tablet Take 1 tablet by mouth every 6 (six) hours as needed for pain.     [provider]    Anti-infectives (From admission, onward)   Start     Dose/Rate Route Frequency Ordered Stop   05/23/18 2300  vancomycin (VANCOCIN) 500 mg in sodium chloride 0.9 % 100 mL IVPB     500 mg 100 mL/hr over 60 Minutes Intravenous Every 24 hours 05/23/18 0555     05/23/18 0600  piperacillin-tazobactam (ZOSYN) IVPB 3.375 g  Status:  Discontinued     3.375 g 100 mL/hr over 30 Minutes Intravenous Every 6 hours 05/23/18 0253 05/23/18 0301   05/23/18 0315  piperacillin-tazobactam (ZOSYN) IVPB 3.375 g     3.375 g 12.5 mL/hr over 240 Minutes Intravenous Every 8 hours 05/23/18 0301     05/22/18 2330  vancomycin (VANCOCIN) IVPB 1000 mg/200 mL premix     1,000 mg 200 mL/hr over 60 Minutes Intravenous  Once 05/22/18 2329 05/23/18 0037   05/22/18 2330  cefTRIAXone (ROCEPHIN) 2 g in sodium chloride 0.9 % 100 mL IVPB  Status:  Discontinued     2 g 200 mL/hr over 30 Minutes Intravenous Every 24 hours 05/22/18 2329 05/23/18 0301      Scheduled Meds: . aspirin  81 mg Oral Daily  . atorvastatin  40 mg Oral Daily  . docusate sodium  100 mg Oral BID  . fluticasone furoate-vilanterol  1 puff Inhalation Daily  . furosemide      . furosemide  20 mg Intravenous Once  . insulin aspart  0-5 Units Subcutaneous QHS  . insulin aspart  0-9 Units Subcutaneous TID WC  . multivitamin-lutein  1 capsule Oral Daily  . pantoprazole  40 mg Oral BID  . protein supplement shake  11 oz Oral BID BM  . sodium hypochlorite  1 application Topical Daily  . tiotropium  18 mcg Inhalation Daily  . vitamin C  500 mg Oral BID   Continuous Infusions: . piperacillin-tazobactam (ZOSYN)  IV 3.375 g (05/24/18 0534)  . vancomycin 500 mg (05/23/18 2241)    Allergies  Allergen Reactions  . Eggs Or Egg-Derived Products Diarrhea, Itching and Nausea And Vomiting     Physical Exam  Vitals  Blood pressure 134/83, pulse (!) 135, temperature 98.1 F (36.7 C), temperature source Oral, resp. rate 20, height 5\' 2"  (1.575 m), weight 37.2 kg, SpO2 98 %.  Lower Extremity exam: On the right foot patient still has the cellulitis with likely abscess at the first metatarsal plantar region.  I reviewed her MRI.  Not been read yet but appeared she had some fluid and abscess  formation along the plantar medial aspect of the first metatarsal.  Did not yet appear to have a significant osteomyelitis finding on the MRI but cellulitis is still present all along the medial arch of the foot extending toward the rear foot.  Also of note there is 2-3 open areas where there is some purulent drainage coming from the regions.  Because of her tachycardia hypoxia I do not review or remove the dressing from the left heel which is more stable from an infectious standpoint but still has a necrotic eschar to the region.  Data Review  CBC Recent Labs  Lab 05/22/18 2124 05/23/18 0426 05/24/18 0451  WBC 15.6* 12.4* 15.8*  HGB 11.1* 9.3* 9.3*  HCT 34.7* 29.5* 30.3*  PLT 351 305 315  MCV 94.6 95.8 98.4  MCH 30.2 30.2 30.2  MCHC 32.0 31.5 30.7  RDW 12.4 12.4 12.6  LYMPHSABS 1.7  --   --   MONOABS 0.6  --   --   EOSABS 0.1  --   --   BASOSABS 0.1  --   --    ------------------------------------------------------------------------------------------------------------------  Chemistries  Recent Labs  Lab 05/22/18 2124 05/23/18 0426 05/24/18 0451  NA 132* 135 136  K 5.3* 4.1 4.9  CL 95* 100 102  CO2 32 30 26  GLUCOSE 513* 397* 132*  BUN 20 20 27*  CREATININE 0.94 0.82 1.00  CALCIUM 8.3* 7.7* 8.0*  AST 15  --   --   ALT 10  --   --   ALKPHOS 108  --   --   BILITOT 0.3  --   --    ------------------------------------------------------------------------------------------------------------------ estimated creatinine clearance is 33.8 mL/min (by C-G formula based on SCr  of 1 mg/dL). ------------------------------------------------------------------------------------------------------------------ No results for input(s): TSH, T4TOTAL, T3FREE, THYROIDAB in the last 72 hours.  Invalid input(s): FREET3 Urinalysis    Component Value Date/Time   COLORURINE YELLOW (A) 05/24/2018 0307   APPEARANCEUR TURBID (A) 05/24/2018 0307   APPEARANCEUR Clear 10/13/2014 1412   LABSPEC 1.018 05/24/2018 0307   LABSPEC 1.031 10/13/2014 1412   PHURINE 5.0 05/24/2018 0307   GLUCOSEU >=500 (A) 05/24/2018 0307   GLUCOSEU >=500 10/13/2014 1412   HGBUR SMALL (A) 05/24/2018 0307   BILIRUBINUR NEGATIVE 05/24/2018 0307   BILIRUBINUR Negative 10/13/2014 1412   KETONESUR NEGATIVE 05/24/2018 0307   PROTEINUR 100 (A) 05/24/2018 0307   NITRITE NEGATIVE 05/24/2018 0307   LEUKOCYTESUR SMALL (A) 05/24/2018 0307   LEUKOCYTESUR 1+ 10/13/2014 1412     Imaging results:   Mr Foot Right Wo Contrast  Result Date: 05/24/2018 CLINICAL DATA:  Bilateral forefoot pain. History of right toe amputation. Noncompliant diabetic. EXAM: MRI OF THE RIGHT FOREFOOT WITHOUT CONTRAST TECHNIQUE: Multiplanar, multisequence MR imaging of the right forefoot was performed. No intravenous contrast was administered. COMPARISON:  Radiographs 05/22/2018, 04/08/2018 and 05/17/2017. 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 The remaining 5th metatarsal demonstrates no abnormal signal. There is no T2 hyperintensity, abnormal T1 signal or cortical destruction throughout the additional bones of the forefoot. The tibial sesamoid of the 1st metatarsal is bipartite. There are no significant joint effusions or arthropathic changes. Ligaments Intact Lisfranc ligament. Muscles and Tendons Mild T2 hyperintensity throughout the forefoot musculature, likely related to underlying diabetes. No significant tenosynovitis. Soft tissues There is generalized soft tissue edema, especially within the  dorsal subcutaneous fat. There are multiple areas of apparent skin ulceration involving the medial forefoot, predominately plantar and medial to the 1st metatarsophalangeal joint. There are inflammatory  changes within the adjacent soft tissues, but no focal fluid collection on noncontrast imaging. IMPRESSION: 1. Areas of skin ulceration medial and plantar to the 1st metatarsophalangeal joint without evidence of focal abscess. 2. Generalized soft tissue edema may relate to cellulitis. 3. No evidence of osteomyelitis or septic joint. 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 Electronically Signed   By: Carey Bullocks M.D.   On: 05/24/2018 08:10   Mr Foot Left Wo Contrast  Result Date: 05/24/2018 CLINICAL DATA:  Bilateral forefoot pain. History of right toe amputation. Noncompliant diabetic. EXAM: MRI OF THE LEFT FOOT WITHOUT CONTRAST TECHNIQUE: Multiplanar, multisequence MR imaging of the left forefoot was performed. No intravenous contrast was administered. COMPARISON:  None. FINDINGS: Bones/Joint/Cartilage There is mild T2 hyperintensity within the marrow of the distal 3rd phalanx. There is no abnormal T1 signal or cortical destruction. The visualized bones are otherwise normal. There are no significant joint effusions or arthropathic changes. Ligaments The Lisfranc ligament is intact. Muscles and Tendons There is mild generalized T2 hyperintensity throughout the forefoot musculature. There is a small amount fluid within the flexor tendon sheaths. Soft tissues Generalized soft tissue edema, especially within the dorsal subcutaneous fat. No focal fluid collection or skin ulceration identified. IMPRESSION: 1. Generalized soft tissue edema may relate to cellulitis. No focal fluid collection or skin ulceration identified. 2. Nonspecific T2 hyperintensity within the distal phalanx of the 3rd toe. This may be secondary to hyperemia. There is no cortical destruction or abnormal T1 signal to confirm  osteomyelitis. No adjacent focal soft tissue abnormality or joint effusion. Electronically Signed   By: Carey Bullocks M.D.   On: 05/24/2018 08:04   Dg Chest Port 1 View  Result Date: 05/24/2018 CLINICAL DATA:  Shortness of breath.  History of COPD and diabetes. EXAM: PORTABLE CHEST 1 VIEW COMPARISON:  Radiographs 03/14/2015. FINDINGS: 0741 hours. The heart size and mediastinal contours are stable. There is aortic atherosclerosis. Interval diffusely increased interstitial markings, most consistent with pulmonary edema. There is no confluent airspace opacity, pneumothorax or significant pleural effusion. No acute osseous findings are seen. IMPRESSION: Diffuse interstitial thickening most consistent with interstitial pulmonary edema superimposed on obstructive lung disease. Electronically Signed   By: Carey Bullocks M.D.   On: 05/24/2018 08:14   Dg Foot Complete Right  Result Date: 05/22/2018 CLINICAL DATA:  Worsening wound to the right foot. 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 Focal cortical scalloping along the lateral fifth metatarsal shaft is again demonstrated without significant change. This is likely postoperative. No definite evidence of bone erosion to suggest osteomyelitis. No evidence of acute fracture or dislocation. No focal bone lesion or bone destruction. Soft tissue defect on the plantar aspect of the foot at the level of the metatarsal heads consistent with ulceration. No underlying bone erosions. Prominent vascular calcifications. 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 Persistent cortical scalloping without change, likely postoperative. Soft tissue ulceration over the metatarsal heads. No definite radiographic evidence of osteomyelitis. Electronically Signed   By: Burman Nieves M.D.   On: 05/22/2018 23:52    Assessment & Plan: Patient had  the episode with tachycardia and hypoxia so we had to postpone her surgery today she is not stable enough to proceed with any type of anesthesia.  Because her cellulitis is significant along the medial arch and there was 2 3 open draining abscesses on that region I elected  to open the plantar skin to try to promote more drainage from these abscessed areas.  This was accomplished after Betadine prep with a 15 scalpel blade.  Area was probed with some sterile scissors as well to try to promote as much drainage as possible.  I did encounter frank pus when opening this area up so hopefully this drainage help This in an attempt to try to stem the cellulitis and promote more drainage so should be more responsive to the antibiotics.  I have rescheduled her for the OR tomorrow morning to do a more complete incision and drainage which are likely also involve some deeper tissues deep to the plantar fascia.  MRI did not suggest osteomyelitis but the metatarsal head is present and exposed in the wound and will likely need to be resected as well.  She is scheduled for angioplasty hopefully on Tuesday.  Left heel will also need debridement as well.  We will likely use wound VAC on the left heel.  I did pack the wounds on the right with a wet-to-dry dressing.  She is currently undergoing respiratory therapy to try to improve her hypoxia and stabilize her.  Active Problems:   Acute osteomyelitis of right foot [facility])   Family Communication: Plan discussed with patient and and family.  Recardo Evangelist M.D on 05/24/2018 at 8:24 AM  Thank you for the consult, we will follow the patient with you in the Hospital.

## 2018-05-24 NOTE — Significant Event (Signed)
Rapid Response Event Note  Overview: Time Called: 0713 Arrival Time: 0715 Event Type: Respiratory  Initial Focused Assessment: called for RR on pt who was SOB and on NRB. Patient awake, alert, laying in bed, [facility]    Interventions: ABG, CXR, lasix and foley catheter. DR Pyreddy in room to assess pt. Transitioned back to 3L [place] 02 sats 94-95%  Plan of Care (if not transferred): Floor RN to call if further assistance needed. Will monitor.  Event Summary: Name of Physician Notified: Pyreddy at 0730    at    Outcome: Stayed in room and stabalized  Event End Time: 0800  Noralee Dutko A

## 2018-05-24 NOTE — Progress Notes (Signed)
Pt placed on bipap, will tx to stepdown

## 2018-05-24 NOTE — Progress Notes (Signed)
Pharmacy Antibiotic Note  April KillingsDoris J Hallgren is a 62 y.o. female admitted on 05/22/2018 with wound infection.  Pharmacy has been consulted for vanc/zosyn dosing. Patient received vanc 1g, zosyn 3.375g, ceftriaxone 2g IV x 1  Plan: Will continue vanc 500 mg IV q24h   Will draw trough 11/25 @ 2200 Will continue zosyn 3.375g IV q8h  Ke 0.0342 T1/2 ~ 24 hrs Goal trough 15 - 20 mcg/mL  11/23- Patient received Lasix x 2 doses/ Watch renal fxn. Scr increased from 0.82 to 1.00. Watch- Zosyn/Vancomycin    Height: 5\' 2"  (157.5 cm) Weight: 97 lb 10.6 oz (44.3 kg) IBW/kg (Calculated) : 50.1  Temp (24hrs), Avg:97.4 F (36.3 C), Min:96.7 F (35.9 C), Max:98.1 F (36.7 C)  Recent Labs  Lab 05/22/18 2124 05/22/18 2342 05/23/18 0426 05/23/18 0520 05/23/18 0805 05/24/18 0451  WBC 15.6*  --  12.4*  --   --  15.8*  CREATININE 0.94  --  0.82  --   --  1.00  LATICACIDVEN 2.5* 1.8  --  1.7 1.5  --     Estimated Creatinine Clearance: 40.3 mL/min (by C-G formula based on SCr of 1 mg/dL).    Allergies  Allergen Reactions  . Eggs Or Egg-Derived Products Diarrhea, Itching and Nausea And Vomiting    Vanc 11/22>> Zosyn 11/22>>  Thank you for allowing pharmacy to be a part of this patient's care.  Bari MantisKristin Riyanshi Wahab PharmD Clinical Pharmacist 05/24/2018

## 2018-05-24 NOTE — Consult Note (Signed)
ANTICOAGULATION CONSULT NOTE   Pharmacy Consult for heparin drip management Indication: VTE prophylaxis  Patient Measurements: Height: 5\' 2"  (157.5 cm) Weight: 97 lb 10.6 oz (44.3 kg) IBW/kg (Calculated) : 50.1  Vital Signs: Temp: 98.4 F (36.9 C) (11/23 1600) Temp Source: Axillary (11/23 1600) BP: 119/64 (11/23 1800) Pulse Rate: 100 (11/23 1800)  Labs: Recent Labs    05/22/18 2124 05/23/18 0426 05/24/18 0451 05/24/18 1150 05/24/18 1920  HGB 11.1* 9.3* 9.3*  --   --   HCT 34.7* 29.5* 30.3*  --   --   PLT 351 305 315  --   --   APTT  --   --   --  34  --   HEPARINUNFRC  --   --   --  0.21* 0.41  CREATININE 0.94 0.82 1.00  --   --     Estimated Creatinine Clearance: 40.3 mL/min (by C-G formula based on SCr of 1 mg/dL).   Medical History: Past Medical History:  Diagnosis Date  . Anxiety   . Colon polyps   . COPD (chronic obstructive pulmonary disease) (HCC)   . Diabetes mellitus without complication (HCC)   . DVT (deep venous thrombosis) (HCC)   . DVT (deep venous thrombosis) (HCC)   . History of colonic polyps   . Hyperlipidemia   . Peripheral artery disease (HCC)   . Pulmonary nodules/lesions, multiple 08/2012   NEGATIVE PET SCAN  . Retroperitoneal abscess (HCC)    history of in 04/2013  . Tobacco abuse     Assessment: 62 year old female with history of COPD, chronic tobacco abuse, DM, PVD, DVT on Xarelto diabetic foot ulcer.  There was a plan for debridement by podiatry earlier today.  While on the floor she was noticed to have desaturation and increased work of breathing and had to be transferred to intensive care unit because of requirement for titration of BiPAP.  Surgery was put on hold. There is no recent history of heparin administration to guide dosing. Baseline aPTT is 34s and HL is 0.21 IU/mL. Her last dose of Xarelto was last week at an unknown time. Low baseline aPTT indicates that we can use heparin levels to monitor therapy  Goal of Therapy:   Heparin level 0.3-0.7 units/ml aPTT 66-102 seconds Monitor platelets by anticoagulation protocol: Yes   Plan:  Heparin level 11/23 @ 1920 resulted 0.41. Continue heparin infusion at 750 units/hr Check anti-Xa level in 6 hours and daily while on heparin Continue to monitor H&H and platelets  Orinda Kennerhris A Marquasia Schmieder, PharmD Clinical Pharmacist 05/24/2018,7:50 PM

## 2018-05-24 NOTE — Progress Notes (Addendum)
Around 7:15 am this morning, pt called to the desk feeling SOB. Upon assessment, pt is sitting on edge of bed, labored breathing, initially 83% on 2L [place], oxygen increased to 6L only 84%, so NRB applied. Rapid Response called around 7:20, and Dr. Tobi BastosPyreddy paged. Foley inserted d/t urinary retention overnight.   Family at bedside updated.   [facility] Bedside report given to Hiral RN.

## 2018-05-24 NOTE — Consult Note (Signed)
Name: April [name] MRN: [id number] DOB: 06-01-1956     CONSULTATION DATE: 05/22/2018   HISTORY OF PRESENT ILLNESS:    62 year old lady with history of COPD, chronic tobacco abuse, diabetes mellitus, PVD, DVT on Xarelto diabetic foot with diabetic ulcer.  Patient is admitted with right diabetic foot ulcer, has been on antibiotics Zosyn plus vancomycin and had a plan for debridement by podiatry earlier today.  While on the floor she was noticed to have desaturation and increased work of breathing and had to be transferred to intensive care unit because of requirement for titration of BiPAP.  Surgery was put on hold and the patient arrived to the intensive care unit in no distress. All history was obtained from primary care provider, the patient and EMR  PAST MEDICAL HISTORY :   has a past medical history of Anxiety, Colon polyps, COPD (chronic obstructive pulmonary disease) (HCC), Diabetes mellitus without complication (HCC), DVT (deep venous thrombosis) (HCC), DVT (deep venous thrombosis) (HCC), History of colonic polyps, Hyperlipidemia, Peripheral artery disease (HCC), Pulmonary nodules/lesions, multiple (08/2012), Retroperitoneal abscess (HCC), and Tobacco abuse.  has a past surgical history that includes Colonoscopy (05/2007) and Cholecystectomy. Prior to Admission medications   Medication Sig Start Date End Date Taking? Authorizing Provider  aspirin 81 MG chewable tablet Chew 81 mg by mouth daily. 05/30/17  Yes [provider]  atorvastatin (LIPITOR) 40 MG tablet Take 40 mg by mouth daily. 10/24/17 10/24/18 Yes [provider]  Fluticasone-Salmeterol (ADVAIR DISKUS) 250-50 MCG/DOSE AEPB Inhale 1 puff into the lungs 2 (two) times daily. 03/14/15 01/16/26 Yes Beers, Charmayne Sheer, PA-C  pantoprazole (PROTONIX) 40 MG tablet Take 40 mg by mouth 2 (two) times daily.  04/25/18  Yes [provider]  rivaroxaban (XARELTO) 10 MG TABS tablet Take 10 mg by mouth every evening.    Yes [provider]  SANTYL ointment Apply 1 application topically daily.  05/19/18  Yes [provider]  sodium hypochlorite (DAKIN'S 1/4 STRENGTH) 0.125 % SOLN Apply 1 application topically daily. Use as a wound cleanser, pat dry, then apply dressings 12/18/17  Yes [provider]  SPIRIVA HANDIHALER 18 MCG inhalation capsule Place 18 mcg into inhaler and inhale daily.  05/19/18  Yes [provider]  albuterol (PROVENTIL HFA;VENTOLIN HFA) 108 (90 Base) MCG/ACT inhaler Inhale 2 puffs into the lungs every 6 (six) hours as needed for wheezing or shortness of breath.    [provider]  albuterol (PROVENTIL) (2.5 MG/3ML) 0.083% nebulizer solution Inhale 2.5 mg into the lungs every 6 (six) hours as needed for wheezing or shortness of breath.     [provider]  guaiFENesin-dextromethorphan (ROBITUSSIN DM) 100-10 MG/5ML syrup Take 5 mLs by mouth every 4 (four) hours as needed for cough. 04/11/18   Milagros Loll, MD  oxyCODONE-acetaminophen (PERCOCET) 10-325 MG tablet Take 1 tablet by mouth every 6 (six) hours as needed for pain.     [provider]   Allergies  Allergen Reactions  . Eggs Or Egg-Derived Products Diarrhea, Itching and Nausea And Vomiting    FAMILY HISTORY:  family history includes Brain cancer in her mother. SOCIAL HISTORY:  reports that she has been smoking cigarettes. She has a 70.50 pack-year smoking history. She has never used smokeless tobacco. She reports that she does not drink alcohol or use drugs.  REVIEW OF SYSTEMS:   Unable to obtain due to critical illness   VITAL SIGNS: Temp:  [96.7 F (35.9 C)-98.1 F (36.7 C)] 96.7 F (35.9  C) (11/23 0849) Pulse Rate:  [101-135] 125 (11/23 0900) Resp:  [20-33] 25 (11/23 0900) BP: (113-134)/(63-83) 127/82 (11/23 0900) SpO2:  [83 %-99 %] 94 % (11/23 0900) FiO2 (%):  [32 %] 32 % (11/23 0849) Weight:  [37.2 kg-44.3 kg] 44.3 kg (11/23 0849)  Physical Examination:    Lethargic, oriented, no focal motor deficits Tolerating BiPAP, no distress, bilateral equal air entry with diffuse expiratory wheezes S1 & S2 are audible with no murmur Benign abdominal exam was normal peristalsis Rest right foot ulcer and left foot ulcer   ASSESSMENT / PLAN:  Acute respiratory failure with hypoxemia and hypercapnia requiring BiPAP. -Monitor ABG, optimize BiPAP settings and continuous respiratory care  Acute exacerbation of COPD -Optimize bronchodilators + inhaled steroids + tapering systemic steroids + empiric antibiotics  Atelectasis and possible pneumonia with pulmonary congestion.  Right lower airspace disease -Zosyn + vancomycin.  Monitor CXR + CBC + FiO2  Right diabetic foot with diabetic ulcer.  No osteomyelitis on MRI. -Plan per podiatry for debridement. -Zosyn + vancomycin  Diabetes mellitus -Optimize glycemic control  DVT.  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  Xarelto on hold for planned surgery and will bridge with heparin drip  Anemia -Keep hemoglobin more than 7 g/dL  UTI -ABX as above and follows culture  Full code  DVT + GI prophylaxis.  Continue supportive care  Critical care time 45 minutes

## 2018-05-25 ENCOUNTER — Inpatient Hospital Stay: Payer: Medicare HMO | Admitting: Anesthesiology

## 2018-05-25 ENCOUNTER — Encounter
Admission: EM | Disposition: A | Discharge status: Home Health | Payer: Self-pay | Source: Home / Self Care | Attending: Internal Medicine

## 2018-05-25 ENCOUNTER — Encounter: Payer: Self-pay | Admitting: Podiatry

## 2018-05-25 HISTORY — PX: IRRIGATION AND DEBRIDEMENT FOOT: SHX6602

## 2018-05-25 LAB — GLUCOSE, CAPILLARY
GLUCOSE-CAPILLARY: 152 mg/dL — AB (ref 70–99)
GLUCOSE-CAPILLARY: 166 mg/dL — AB (ref 70–99)
GLUCOSE-CAPILLARY: 170 mg/dL — AB (ref 70–99)
GLUCOSE-CAPILLARY: 178 mg/dL — AB (ref 70–99)
GLUCOSE-CAPILLARY: 451 mg/dL — AB (ref 70–99)
GLUCOSE-CAPILLARY: 70 mg/dL (ref 70–99)
Glucose-Capillary: 440 mg/dL — ABNORMAL HIGH (ref 70–99)
Glucose-Capillary: 358 mg/dL — ABNORMAL HIGH (ref 70–99)
Glucose-Capillary: 292 mg/dL — ABNORMAL HIGH (ref 70–99)
Glucose-Capillary: 234 mg/dL — ABNORMAL HIGH (ref 70–99)
Glucose-Capillary: 189 mg/dL — ABNORMAL HIGH (ref 70–99)
Glucose-Capillary: 161 mg/dL — ABNORMAL HIGH (ref 70–99)
Glucose-Capillary: 117 mg/dL — ABNORMAL HIGH (ref 70–99)
Glucose-Capillary: 82 mg/dL (ref 70–99)
Glucose-Capillary: 85 mg/dL (ref 70–99)
Glucose-Capillary: 94 mg/dL (ref 70–99)
Glucose-Capillary: 124 mg/dL — ABNORMAL HIGH (ref 70–99)
Glucose-Capillary: 151 mg/dL — ABNORMAL HIGH (ref 70–99)
Glucose-Capillary: 198 mg/dL — ABNORMAL HIGH (ref 70–99)

## 2018-05-25 LAB — BLOOD GAS, ARTERIAL
ACID-BASE DEFICIT: 1.1 mmol/L (ref 0.0–2.0)
BICARBONATE: 23.5 mmol/L (ref 20.0–28.0)
FIO2: 0.4
O2 Saturation: 92.7 %
PEEP/CPAP: 5 cmH2O
PH ART: 7.4 (ref 7.350–7.450)
Patient temperature: 37
RATE: 15 resp/min
VT: 500 mL
pCO2 arterial: 38 mmHg (ref 32.0–48.0)
pO2, Arterial: 66 mmHg — ABNORMAL LOW (ref 83.0–108.0)

## 2018-05-25 LAB — BASIC METABOLIC PANEL
ANION GAP: 8 (ref 5–15)
BUN: 45 mg/dL — ABNORMAL HIGH (ref 8–23)
CO2: 27 mmol/L (ref 22–32)
CREATININE: 1.19 mg/dL — AB (ref 0.44–1.00)
Calcium: 8.2 mg/dL — ABNORMAL LOW (ref 8.9–10.3)
Chloride: 101 mmol/L (ref 98–111)
GFR calc non Af Amer: 48 mL/min — ABNORMAL LOW (ref >60)
GFR, EST AFRICAN AMERICAN: 55 mL/min — AB (ref >60)
Glucose, Bld: 305 mg/dL — ABNORMAL HIGH (ref 70–99)
Potassium: 4.4 mmol/L (ref 3.5–5.1)
Sodium: 136 mmol/L (ref 135–145)

## 2018-05-25 LAB — CBC WITH DIFFERENTIAL/PLATELET
Abs Immature Granulocytes: 0.1 10*3/uL — ABNORMAL HIGH (ref 0.00–0.07)
BASOS ABS: 0 10*3/uL (ref 0.0–0.1)
Basophils Relative: 0 %
EOS PCT: 0 %
Eosinophils Absolute: 0 10*3/uL (ref 0.0–0.5)
HCT: 28.2 % — ABNORMAL LOW (ref 36.0–46.0)
HEMOGLOBIN: 8.9 g/dL — AB (ref 12.0–15.0)
Immature Granulocytes: 1 %
LYMPHS PCT: 9 %
Lymphs Abs: 1 10*3/uL (ref 0.7–4.0)
MCH: 30 pg (ref 26.0–34.0)
MCHC: 31.6 g/dL (ref 30.0–36.0)
MCV: 94.9 fL (ref 80.0–100.0)
MONO ABS: 0.1 10*3/uL (ref 0.1–1.0)
Monocytes Relative: 1 %
NEUTROS ABS: 9.8 10*3/uL — AB (ref 1.7–7.7)
NRBC: 0 % (ref 0.0–0.2)
Neutrophils Relative %: 89 %
Platelets: 310 10*3/uL (ref 150–400)
RBC: 2.97 MIL/uL — AB (ref 3.87–5.11)
RDW: 12.5 % (ref 11.5–15.5)
WBC: 10.9 10*3/uL — ABNORMAL HIGH (ref 4.0–10.5)

## 2018-05-25 LAB — PROTIME-INR
INR: 1.18
Prothrombin Time: 14.9 seconds (ref 11.4–15.2)

## 2018-05-25 LAB — HEPARIN LEVEL (UNFRACTIONATED)
HEPARIN UNFRACTIONATED: 0.28 [IU]/mL — AB (ref 0.30–0.70)
Heparin Unfractionated: 0.4 IU/mL (ref 0.30–0.70)
Heparin Unfractionated: 0.15 IU/mL — ABNORMAL LOW (ref 0.30–0.70)

## 2018-05-25 LAB — VANCOMYCIN, TROUGH: VANCOMYCIN TR: 8 ug/mL — AB (ref 15–20)

## 2018-05-25 LAB — HEMOGLOBIN A1C
HEMOGLOBIN A1C: 10.9 % — AB (ref 4.8–5.6)
MEAN PLASMA GLUCOSE: 266.13 mg/dL

## 2018-05-25 SURGERY — IRRIGATION AND DEBRIDEMENT FOOT
Anesthesia: General | Laterality: Bilateral

## 2018-05-25 MED ORDER — DEXMEDETOMIDINE HCL IN NACL 200 MCG/50ML IV SOLN
INTRAVENOUS | Status: AC
  Filled 2018-05-25: qty 50

## 2018-05-25 MED ORDER — DEXMEDETOMIDINE HCL 200 MCG/2ML IV SOLN
INTRAVENOUS | Status: DC | PRN
  Administered 2018-05-25 (×2): 8 ug via INTRAVENOUS

## 2018-05-25 MED ORDER — RIVAROXABAN 10 MG PO TABS
10.0000 mg | ORAL_TABLET | Freq: Every day | ORAL | Status: DC
  Administered 2018-05-28 – 2018-05-30 (×3): 10 mg via ORAL
  Filled 2018-05-25 (×5): qty 1

## 2018-05-25 MED ORDER — HEPARIN BOLUS VIA INFUSION
700.0000 [IU] | Freq: Once | INTRAVENOUS | Status: AC
  Administered 2018-05-25: 700 [IU] via INTRAVENOUS
  Filled 2018-05-25: qty 700

## 2018-05-25 MED ORDER — VANCOMYCIN HCL IN DEXTROSE 1-5 GM/200ML-% IV SOLN: 1000.0000 mg | INTRAVENOUS | Status: DC

## 2018-05-25 MED ORDER — INSULIN GLARGINE 100 UNIT/ML ~~LOC~~ SOLN
8.0000 [IU] | Freq: Every day | SUBCUTANEOUS | Status: DC
  Administered 2018-05-25: 8 [IU] via SUBCUTANEOUS
  Filled 2018-05-25 (×2): qty 0.08

## 2018-05-25 MED ORDER — PROPOFOL 10 MG/ML IV BOLUS
INTRAVENOUS | Status: DC | PRN
  Administered 2018-05-25: 30 mg via INTRAVENOUS

## 2018-05-25 MED ORDER — INSULIN REGULAR(HUMAN) IN NACL 100-0.9 UT/100ML-% IV SOLN: INTRAVENOUS | Status: DC

## 2018-05-25 MED ORDER — BUPIVACAINE HCL 0.5 % IJ SOLN
INTRAMUSCULAR | Status: DC | PRN
  Administered 2018-05-25 (×2): 8 mL

## 2018-05-25 MED ORDER — PROPOFOL 500 MG/50ML IV EMUL
INTRAVENOUS | Status: AC
  Filled 2018-05-25: qty 50

## 2018-05-25 MED ORDER — FENTANYL CITRATE (PF) 100 MCG/2ML IJ SOLN
INTRAMUSCULAR | Status: DC | PRN
  Administered 2018-05-25: 25 ug via INTRAVENOUS

## 2018-05-25 MED ORDER — KETAMINE HCL 50 MG/ML IJ SOLN
INTRAMUSCULAR | Status: AC
  Filled 2018-05-25: qty 10

## 2018-05-25 MED ORDER — PHENYLEPHRINE HCL 10 MG/ML IJ SOLN
INTRAMUSCULAR | Status: DC | PRN
  Administered 2018-05-25: 200 ug via INTRAVENOUS

## 2018-05-25 MED ORDER — SODIUM CHLORIDE 0.9 % IV SOLN
INTRAVENOUS | Status: DC
  Administered 2018-05-25: 19:00:00 via INTRAVENOUS

## 2018-05-25 MED ORDER — PROPOFOL 500 MG/50ML IV EMUL
INTRAVENOUS | Status: DC | PRN
  Administered 2018-05-25: 25 ug/kg/min via INTRAVENOUS

## 2018-05-25 MED ORDER — KETAMINE HCL 10 MG/ML IJ SOLN
INTRAMUSCULAR | Status: DC | PRN
  Administered 2018-05-25: 20 mg via INTRAVENOUS
  Administered 2018-05-25: 30 mg via INTRAVENOUS

## 2018-05-25 MED ORDER — VANCOMYCIN HCL 500 MG IV SOLR
500.0000 mg | Freq: Once | INTRAVENOUS | Status: DC
  Filled 2018-05-25: qty 500

## 2018-05-25 MED ORDER — BUPIVACAINE HCL (PF) 0.5 % IJ SOLN
INTRAMUSCULAR | Status: AC
  Filled 2018-05-25: qty 30

## 2018-05-25 MED ORDER — INSULIN ASPART 100 UNIT/ML ~~LOC~~ SOLN: 0.0000 [IU] | Freq: Three times a day (TID) | SUBCUTANEOUS | Status: DC

## 2018-05-25 MED ORDER — ONDANSETRON HCL 4 MG/2ML IJ SOLN: 4.0000 mg | Freq: Once | INTRAMUSCULAR | Status: DC | PRN

## 2018-05-25 MED ORDER — LIDOCAINE HCL (PF) 1 % IJ SOLN
INTRAMUSCULAR | Status: AC
  Filled 2018-05-25: qty 30

## 2018-05-25 MED ORDER — FENTANYL CITRATE (PF) 100 MCG/2ML IJ SOLN: 25.0000 ug | INTRAMUSCULAR | Status: DC | PRN

## 2018-05-25 MED ORDER — PHENYLEPHRINE HCL 10 MG/ML IJ SOLN
INTRAMUSCULAR | Status: AC
  Filled 2018-05-25: qty 1

## 2018-05-25 MED ORDER — FENTANYL CITRATE (PF) 100 MCG/2ML IJ SOLN
INTRAMUSCULAR | Status: AC
  Filled 2018-05-25: qty 2

## 2018-05-25 MED ORDER — LIDOCAINE-EPINEPHRINE 1 %-1:100000 IJ SOLN
INTRAMUSCULAR | Status: AC
  Filled 2018-05-25: qty 1

## 2018-05-25 SURGICAL SUPPLY — 67 items
"PENCIL ELECTRO HAND CTR " (MISCELLANEOUS) ×1 IMPLANT
BANDAGE ACE 4X5 VEL STRL LF (GAUZE/BANDAGES/DRESSINGS) ×3 IMPLANT
BLADE OSC/SAGITTAL MD 5.5X18 (BLADE) IMPLANT
BLADE OSCILLATING/SAGITTAL (BLADE)
BLADE SURG 15 STRL LF DISP TIS (BLADE) ×1 IMPLANT
BLADE SURG 15 STRL SS (BLADE) ×2
BLADE SW THK.38XMED LNG THN (BLADE) IMPLANT
BNDG COHESIVE 4X5 TAN STRL (GAUZE/BANDAGES/DRESSINGS) ×3 IMPLANT
BNDG COHESIVE 6X5 TAN STRL LF (GAUZE/BANDAGES/DRESSINGS) ×3 IMPLANT
BNDG CONFORM 2 STRL LF (GAUZE/BANDAGES/DRESSINGS) ×3 IMPLANT
BNDG CONFORM 3 STRL LF (GAUZE/BANDAGES/DRESSINGS) ×3 IMPLANT
BNDG ESMARK 4X12 TAN STRL LF (GAUZE/BANDAGES/DRESSINGS) ×3 IMPLANT
BNDG GAUZE 4.5X4.1 6PLY STRL (MISCELLANEOUS) ×3 IMPLANT
CANISTER SUCT 1200ML W/VALVE (MISCELLANEOUS) ×3 IMPLANT
CANISTER SUCT 3000ML PPV (MISCELLANEOUS) ×3 IMPLANT
COVER WAND RF STERILE (DRAPES) ×3 IMPLANT
CUFF TOURN 18 STER (MISCELLANEOUS) ×3 IMPLANT
CUFF TOURN DUAL PL 12 NO SLV (MISCELLANEOUS) ×3 IMPLANT
DRAPE FLUOR MINI C-ARM 54X84 (DRAPES) IMPLANT
DRAPE XRAY CASSETTE 23X24 (DRAPES) IMPLANT
DRESSING ALLEVYN 4X4 (MISCELLANEOUS) IMPLANT
DURAPREP 26ML APPLICATOR (WOUND CARE) ×3 IMPLANT
ELECT REM PT RETURN 9FT ADLT (ELECTROSURGICAL) ×3
ELECTRODE REM PT RTRN 9FT ADLT (ELECTROSURGICAL) ×1 IMPLANT
GAUZE PACKING 1/4 X5 YD (GAUZE/BANDAGES/DRESSINGS) ×3 IMPLANT
GAUZE PACKING IODOFORM 1X5 (MISCELLANEOUS) ×3 IMPLANT
GAUZE PETRO XEROFOAM 1X8 (MISCELLANEOUS) ×3 IMPLANT
GAUZE SPONGE 4X4 12PLY STRL (GAUZE/BANDAGES/DRESSINGS) ×3 IMPLANT
GLOVE BIO SURGEON STRL SZ7.5 (GLOVE) ×3 IMPLANT
GLOVE INDICATOR 8.0 STRL GRN (GLOVE) ×3 IMPLANT
GOWN STRL REUS W/ TWL LRG LVL3 (GOWN DISPOSABLE) ×2 IMPLANT
GOWN STRL REUS W/TWL LRG LVL3 (GOWN DISPOSABLE) ×4
GOWN STRL REUS W/TWL MED LVL3 (GOWN DISPOSABLE) ×3 IMPLANT
HANDPIECE VERSAJET DEBRIDEMENT (MISCELLANEOUS) IMPLANT
IV NS 1000ML (IV SOLUTION) ×2
IV NS 1000ML BAXH (IV SOLUTION) ×1 IMPLANT
KIT DRSG VAC SLVR GRANUFM (MISCELLANEOUS) ×3 IMPLANT
KIT TURNOVER KIT A (KITS) ×3 IMPLANT
LABEL OR SOLS (LABEL) ×3 IMPLANT
NDL FILTER BLUNT 18X1 1/2 (NEEDLE) ×1 IMPLANT
NDL HYPO 25X1 1.5 SAFETY (NEEDLE) ×1 IMPLANT
NEEDLE FILTER BLUNT 18X 1/2SAF (NEEDLE) ×2
NEEDLE FILTER BLUNT 18X1 1/2 (NEEDLE) ×1 IMPLANT
NEEDLE HYPO 25X1 1.5 SAFETY (NEEDLE) ×3 IMPLANT
NS IRRIG 500ML POUR BTL (IV SOLUTION) ×3 IMPLANT
PACK EXTREMITY ARMC (MISCELLANEOUS) ×3 IMPLANT
PAD ABD DERMACEA PRESS 5X9 (GAUZE/BANDAGES/DRESSINGS) ×3 IMPLANT
PENCIL ELECTRO HAND CTR (MISCELLANEOUS) ×3 IMPLANT
PULSAVAC PLUS IRRIG FAN TIP (DISPOSABLE) ×3
RASP SM TEAR CROSS CUT (RASP) IMPLANT
SHIELD FULL FACE ANTIFOG 7M (MISCELLANEOUS) ×3 IMPLANT
SOL .9 NS 3000ML IRR  AL (IV SOLUTION) ×2
SOL .9 NS 3000ML IRR UROMATIC (IV SOLUTION) ×1 IMPLANT
STOCKINETTE IMPERVIOUS 9X36 MD (GAUZE/BANDAGES/DRESSINGS) ×3 IMPLANT
SUT ETHILON 2 0 FS 18 (SUTURE) ×6 IMPLANT
SUT ETHILON 4-0 (SUTURE) ×2
SUT ETHILON 4-0 FS2 18XMFL BLK (SUTURE) ×1
SUT VIC AB 3-0 SH 27 (SUTURE) ×2
SUT VIC AB 3-0 SH 27X BRD (SUTURE) ×1 IMPLANT
SUT VIC AB 4-0 FS2 27 (SUTURE) ×3 IMPLANT
SUTURE ETHLN 4-0 FS2 18XMF BLK (SUTURE) ×1 IMPLANT
SWAB CULTURE AMIES ANAERIB BLU (MISCELLANEOUS) IMPLANT
SYR 10ML LL (SYRINGE) ×3 IMPLANT
SYR 3ML LL SCALE MARK (SYRINGE) ×3 IMPLANT
TIP FAN IRRIG PULSAVAC PLUS (DISPOSABLE) ×1 IMPLANT
TRAY PREP VAG/GEN (MISCELLANEOUS) ×3 IMPLANT
WND VAC CANISTER 500ML (MISCELLANEOUS) ×3 IMPLANT

## 2018-05-25 NOTE — Consult Note (Signed)
ANTICOAGULATION CONSULT NOTE   Pharmacy Consult for heparin drip management Indication: VTE prophylaxis  Patient Measurements: Height: 5\' 2"  (157.5 cm) Weight: 101 lb 10.1 oz (46.1 kg) IBW/kg (Calculated) : 50.1  Vital Signs: BP: 120/60 (11/24 1000) Pulse Rate: 80 (11/24 1000)  Labs: Recent Labs    05/23/18 0426 05/24/18 0451  05/24/18 1150 05/24/18 1920 05/25/18 0323 05/25/18 1105  HGB 9.3* 9.3*  --   --   --  8.9*  --   HCT 29.5* 30.3*  --   --   --  28.2*  --   PLT 305 315  --   --   --  310  --   APTT  --   --   --  34  --   --   --   LABPROT  --   --   --   --   --  14.9  --   INR  --   --   --   --   --  1.18  --   HEPARINUNFRC  --   --    < > 0.21* 0.41 0.28* 0.40  CREATININE 0.82 1.00  --   --   --  1.19*  --    < > = values in this interval not displayed.    Estimated Creatinine Clearance: 35.2 mL/min (A) (by C-G formula based on SCr of 1.19 mg/dL (H)).   Medical History: Past Medical History:  Diagnosis Date  . Anxiety   . Colon polyps   . COPD (chronic obstructive pulmonary disease) (HCC)   . Diabetes mellitus without complication (HCC)   . DVT (deep venous thrombosis) (HCC)   . DVT (deep venous thrombosis) (HCC)   . History of colonic polyps   . Hyperlipidemia   . Peripheral artery disease (HCC)   . Pulmonary nodules/lesions, multiple 08/2012   NEGATIVE PET SCAN  . Retroperitoneal abscess (HCC)    history of in 04/2013  . Tobacco abuse     Assessment: 62 year old female with history of COPD, chronic tobacco abuse, DM, PVD, DVT on Xarelto diabetic foot ulcer.  There was a plan for debridement by podiatry earlier today.  While on the floor she was noticed to have desaturation and increased work of breathing and had to be transferred to intensive care unit because of requirement for titration of BiPAP.  Surgery was put on hold. There is no recent history of heparin administration to guide dosing. Baseline aPTT is 34s and HL is 0.21 IU/mL. Her last dose  of Xarelto was last week at an unknown time. Low baseline anti-Xa indicates that we can use anti-Xa's to monitor therapy   Goal of Therapy:  Heparin level 0.3-0.7 units/ml aPTT 66-102 seconds Monitor platelets by anticoagulation protocol: Yes   Plan:  11/24 @ 1105 HL 0.40. Will continue 850 units/hr and will check confirmatory HL in 6 hours. Hgb trending down will continue to monitor.  Angelique BlonderMerrill,Auden Wettstein A, PharmD Clinical Pharmacist 05/25/2018,11:47 AM

## 2018-05-25 NOTE — Progress Notes (Signed)
Name: April [name] MRN: [id number] DOB: [birth year]     CONSULTATION DATE: 05/22/2018  Subjective & Objectives: Heparin gtt. + Insulin gtt. Uncontrolled hyperglycemia and had to go on Insulin gtt. last night  PAST MEDICAL HISTORY :   has a past medical history of Anxiety, Colon polyps, COPD (chronic obstructive pulmonary disease) (HCC), Diabetes mellitus without complication (HCC), DVT (deep venous thrombosis) (HCC), DVT (deep venous thrombosis) (HCC), History of colonic polyps, Hyperlipidemia, Peripheral artery disease (HCC), Pulmonary nodules/lesions, multiple (08/2012), Retroperitoneal abscess (HCC), and Tobacco abuse.  has a past surgical history that includes Colonoscopy (05/2007) and Cholecystectomy. Prior to Admission medications   Medication Sig Start Date End Date Taking? Authorizing Provider  aspirin 81 MG chewable tablet Chew 81 mg by mouth daily. 05/30/17  Yes [provider]  atorvastatin (LIPITOR) 40 MG tablet Take 40 mg by mouth daily. 10/24/17 10/24/18 Yes [provider]  Fluticasone-Salmeterol (ADVAIR DISKUS) 250-50 MCG/DOSE AEPB Inhale 1 puff into the lungs 2 (two) times daily. 03/14/15 01/16/26 Yes Beers, Charmayne Sheer, PA-C  pantoprazole (PROTONIX) 40 MG tablet Take 40 mg by mouth 2 (two) times daily.  04/25/18  Yes [provider]  rivaroxaban (XARELTO) 10 MG TABS tablet Take 10 mg by mouth every evening.   Yes [provider]  SANTYL ointment Apply 1 application topically daily.  05/19/18  Yes [provider]  sodium hypochlorite (DAKIN'S 1/4 STRENGTH) 0.125 % SOLN Apply 1 application topically daily. Use as a wound cleanser, pat dry, then apply dressings 12/18/17  Yes [provider]  SPIRIVA HANDIHALER 18 MCG inhalation capsule Place 18 mcg into inhaler and inhale daily.  05/19/18  Yes [provider]  albuterol (PROVENTIL HFA;VENTOLIN HFA) 108 (90 Base) MCG/ACT inhaler Inhale 2 puffs into the lungs every 6  (six) hours as needed for wheezing or shortness of breath.    [provider]  albuterol (PROVENTIL) (2.5 MG/3ML) 0.083% nebulizer solution Inhale 2.5 mg into the lungs every 6 (six) hours as needed for wheezing or shortness of breath.     [provider]  guaiFENesin-dextromethorphan (ROBITUSSIN DM) 100-10 MG/5ML syrup Take 5 mLs by mouth every 4 (four) hours as needed for cough. 04/11/18   Milagros Loll, MD  oxyCODONE-acetaminophen (PERCOCET) 10-325 MG tablet Take 1 tablet by mouth every 6 (six) hours as needed for pain.     [provider]   Allergies  Allergen Reactions  . Eggs Or Egg-Derived Products Diarrhea, Itching and Nausea And Vomiting    FAMILY HISTORY:  family history includes Brain cancer in her mother. SOCIAL HISTORY:  reports that she has been smoking cigarettes. She has a 70.50 pack-year smoking history. She has never used smokeless tobacco. She reports that she does not drink alcohol or use drugs.  REVIEW OF SYSTEMS:   Unable to obtain due to critical illness   VITAL SIGNS: Temp:  [97.6 F (36.4 C)-98.4 F (36.9 C)] 97.6 F (36.4 C) (11/23 2000) Pulse Rate:  [62-116] 80 (11/24 1000) Resp:  [13-35] 16 (11/24 1000) BP: (84-120)/(47-77) 120/60 (11/24 1000) SpO2:  [96 %-100 %] 100 % (11/24 1000) Weight:  [46.1 kg] 46.1 kg (11/24 0447)  Physical Examination:  awake, oriented, no focal motor deficits Tolerating RA/ [place], no distress, bilateral equal air entry with diffuse expiratory wheezes S1 & S2 are audible with no murmur Benign abdominal exam was normal peristalsis Rest right foot ulcer and left foot ulcer   ASSESSMENT / PLAN:  Acute respiratory failure with hypoxemia and hypercapnia (improved).  Tolerating RA/[place] -Monitor O2 sat and work of breathing  Acute exacerbation of COPD -Optimize bronchodilators + inhaled steroids + tapering systemic steroids + empiric antibiotics  Atelectasis and possible pneumonia with pulmonary  congestion.  Right lower airspace disease -Zosyn + vancomycin.  Monitor CXR + CBC + FiO2  AKI with intravascular volume depletion -Optimize volume, monitor renal panel and urine out put.  Right diabetic foot with diabetic ulcer.  No osteomyelitis on MRI. -Plan per podiatry for debridement. BLD culture 05/22/2018 remains -ve -Zosyn + vancomycin  Diabetes mellitus -Optimize glycemic control -Optimize Lantus and taper Insulin gtt.  DVT.  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  Xarelto on hold for planned surgery and will bridge with heparin drip  Anemia -Keep hemoglobin more than 7 g/dL  UTI. Staph Aureus -ABX as above and follows culture  Full code  DVT + GI prophylaxis.  Continue supportive care  Critical care time 45 minutes

## 2018-05-25 NOTE — Progress Notes (Addendum)
Pharmacy Antibiotic Note  April [name] is a 62 y.o. female admitted on 05/22/2018 with wound infection/DM foot infection.  Pharmacy has been consulted for vanc/zosyn dosing. Patient received vanc 1g, zosyn 3.375g, ceftriaxone 2g IV x 1. Patient also with UTI  (per MD note:Diffuse macular rash on the extremities-not itching.  Started 3 days prior to admission.  So could not be from antibiotics   Plan: 11/24 @ 2230 VT 8 mcg/mL subtherapeutic. Will increase dose to vanc 1g IV q24h. Will draw next trough 11/28 @ 2300 prior to 4th dose. Aware of Scr jump from 1.00 >> 1.19 UOP 0.8L/24 hrs will continue to monitor.   Height: 5\' 2"  (157.5 cm) Weight: 101 lb 10.1 oz (46.1 kg) IBW/kg (Calculated) : 50.1  Temp (24hrs), Avg:97.7 F (36.5 C), Min:97.4 F (36.3 C), Max:98.1 F (36.7 C)  Recent Labs  Lab 05/22/18 2124 05/22/18 2342 05/23/18 0426 05/23/18 0520 05/23/18 0805 05/24/18 0451 05/25/18 0323 05/25/18 2238  WBC 15.6*  --  12.4*  --   --  15.8* 10.9*  --   CREATININE 0.94  --  0.82  --   --  1.00 1.19*  --   LATICACIDVEN 2.5* 1.8  --  1.7 1.5  --   --   --   VANCOTROUGH  --   --   --   --   --   --   --  8*    Estimated Creatinine Clearance: 35.2 mL/min (A) (by C-G formula based on SCr of 1.19 mg/dL (H)).    Allergies  Allergen Reactions  . Eggs Or Egg-Derived Products Diarrhea, Itching and Nausea And Vomiting    Vanc 11/22>> Zosyn 11/22>>  Thank you for allowing pharmacy to be a part of this patient's care.  Thomasene Rippleavid Maidie Streight, PharmD, BCPS Clinical Pharmacist 05/26/2018

## 2018-05-25 NOTE — Progress Notes (Signed)
[facility] [place] [facility]   PATIENT NAME: April [name]    MR#:  [id number]  DATE OF BIRTH:  [birth year]  SUBJECTIVE:  CHIEF COMPLAINT:   Chief Complaint  Patient presents with  . Wound Check  Patient seen and evaluated today Decreased shortness of breath Comfortable on oxygen by nasal cannula Weaned of BiPAP  REVIEW OF SYSTEMS:  Review of Systems  Constitutional: Malaise weakness HENT: Negative.   Eyes: Negative.   Respiratory: Shortness of breath decreased, wheezing improved Cardiovascular: Palpitations Gastrointestinal: Negative.   Genitourinary: Negative.   Musculoskeletal: Negative.   Skin: Right foot wound Neurological: Negative.   Endo/Heme/Allergies: Negative.   Psychiatric/Behavioral: Negative.   All other systems reviewed and are negative.  DRUG ALLERGIES:   Allergies  Allergen Reactions  . Eggs Or Egg-Derived Products Diarrhea, Itching and Nausea And Vomiting    VITALS:  Blood pressure (!) 96/52, pulse 74, temperature 97.6 F (36.4 C), temperature source Axillary, resp. rate 17, height 5\' 2"  (1.575 m), weight 46.1 kg, SpO2 98 %.  PHYSICAL EXAMINATION:  Physical Exam  GENERAL:  62 y.o.-year-old patient lying in the bed with no acute distress.  Malnourished EYES: Pupils equal, round, reactive to light and accommodation. No scleral icterus. Extraocular muscles intact.  HEENT: Head atraumatic, normocephalic. Oropharynx and nasopharynx clear.  NECK:  Supple, no jugular venous distention. No thyroid enlargement, no tenderness.  LUNGS: Improved breath sounds bilaterally, bibasilar crepitations decreased On BiPAP CARDIOVASCULAR: S1, S2 tachycardia noted. No murmurs, rubs, or gallops.  ABDOMEN: Soft, nontender, nondistended. Bowel sounds present. No organomegaly or mass.  EXTREMITIES: No pedal edema, cyanosis, or clubbing.  Unable to palpate dorsalis pedis pulses. Ischemic ulcer left heel, also ulcer on the plantar surface of  fifth metatarsal NEUROLOGIC: Cranial nerves II through XII are intact. Muscle strength 5/5 in all extremities. Sensation intact. Gait not checked.  PSYCHIATRIC: The patient is alert and oriented x 3.  SKIN: Diffuse macular rash noted on upper and lower extremities including the palms.  Denies any itching   LABORATORY PANEL:   CBC Recent Labs  Lab 05/25/18 0323  WBC 10.9*  HGB 8.9*  HCT 28.2*  PLT 310   ------------------------------------------------------------------------------------------------------------------  Chemistries  Recent Labs  Lab 05/22/18 2124  05/25/18 0323  NA 132*   < > 136  K 5.3*   < > 4.4  CL 95*   < > 101  CO2 32   < > 27  GLUCOSE 513*   < > 305*  BUN 20   < > 45*  CREATININE 0.94   < > 1.19*  CALCIUM 8.3*   < > 8.2*  AST 15  --   --   ALT 10  --   --   ALKPHOS 108  --   --   BILITOT 0.3  --   --    < > = values in this interval not displayed.   ------------------------------------------------------------------------------------------------------------------  Cardiac Enzymes No results for input(s): TROPONINI in the last 168 hours. ------------------------------------------------------------------------------------------------------------------  RADIOLOGY:  Mr Foot Right Wo Contrast  Result Date: 05/24/2018 CLINICAL DATA:  Bilateral forefoot pain. History of right toe amputation. Noncompliant diabetic. EXAM: MRI OF THE RIGHT FOREFOOT WITHOUT CONTRAST TECHNIQUE: Multiplanar, multisequence MR imaging of the right forefoot was performed. No intravenous contrast was administered. COMPARISON:  Radiographs 05/22/2018, 04/08/2018 and 05/17/2017. 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 The remaining 5th metatarsal demonstrates no abnormal signal. There is no T2 hyperintensity, abnormal T1 signal or cortical  destruction throughout the additional bones of the forefoot. The tibial sesamoid of the 1st metatarsal  is bipartite. There are no significant joint effusions or arthropathic changes. Ligaments Intact Lisfranc ligament. Muscles and Tendons Mild T2 hyperintensity throughout the forefoot musculature, likely related to underlying diabetes. No significant tenosynovitis. Soft tissues There is generalized soft tissue edema, especially within the dorsal subcutaneous fat. There are multiple areas of apparent skin ulceration involving the medial forefoot, predominately plantar and medial to the 1st metatarsophalangeal joint. There are inflammatory changes within the adjacent soft tissues, but no focal fluid collection on noncontrast imaging. IMPRESSION: 1. Areas of skin ulceration medial and plantar to the 1st metatarsophalangeal joint without evidence of focal abscess. 2. Generalized soft tissue edema may relate to cellulitis. 3. No evidence of osteomyelitis or septic joint. 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 Electronically Signed   By: Carey BullocksWilliam  Veazey M.D.   On: 05/24/2018 08:10   Mr Foot Left Wo Contrast  Result Date: 05/24/2018 CLINICAL DATA:  Bilateral forefoot pain. History of right toe amputation. Noncompliant diabetic. EXAM: MRI OF THE LEFT FOOT WITHOUT CONTRAST TECHNIQUE: Multiplanar, multisequence MR imaging of the left forefoot was performed. No intravenous contrast was administered. COMPARISON:  None. FINDINGS: Bones/Joint/Cartilage There is mild T2 hyperintensity within the marrow of the distal 3rd phalanx. There is no abnormal T1 signal or cortical destruction. The visualized bones are otherwise normal. There are no significant joint effusions or arthropathic changes. Ligaments The Lisfranc ligament is intact. Muscles and Tendons There is mild generalized T2 hyperintensity throughout the forefoot musculature. There is a small amount fluid within the flexor tendon sheaths. Soft tissues Generalized soft tissue edema, especially within the dorsal subcutaneous fat. No focal fluid collection or  skin ulceration identified. IMPRESSION: 1. Generalized soft tissue edema may relate to cellulitis. No focal fluid collection or skin ulceration identified. 2. Nonspecific T2 hyperintensity within the distal phalanx of the 3rd toe. This may be secondary to hyperemia. There is no cortical destruction or abnormal T1 signal to confirm osteomyelitis. No adjacent focal soft tissue abnormality or joint effusion. Electronically Signed   By: Carey BullocksWilliam  Veazey M.D.   On: 05/24/2018 08:04   Dg Chest Port 1 View  Result Date: 05/24/2018 CLINICAL DATA:  Shortness of breath.  History of COPD and diabetes. EXAM: PORTABLE CHEST 1 VIEW COMPARISON:  Radiographs 03/14/2015. FINDINGS: 0741 hours. The heart size and mediastinal contours are stable. There is aortic atherosclerosis. Interval diffusely increased interstitial markings, most consistent with pulmonary edema. There is no confluent airspace opacity, pneumothorax or significant pleural effusion. No acute osseous findings are seen. IMPRESSION: Diffuse interstitial thickening most consistent with interstitial pulmonary edema superimposed on obstructive lung disease. Electronically Signed   By: Carey BullocksWilliam  Veazey M.D.   On: 05/24/2018 08:14    EKG:   Orders placed or performed during the hospital encounter of 06/05/16  . ED EKG  . ED EKG  . EKG    ASSESSMENT AND PLAN:   62 year old female with past medical history significant for peripheral vascular disease, ongoing smoking, COPD, diabetes presents to hospital secondary to nonhealing bilateral leg wounds.  1.  Status post hypoxic hypercapnic respiratory failure  Weaned of BiPAP Comfortable on oxygen via nasal cannula  2. Acute pulmonary edema with fluid overload improving IV Lasix for diuresis Monitor input output  3.  Osteomyelitis right foot Left foot wounds -Appreciate podiatry consult.   -For I&D this afternoon by podiatry -Vascular surgery has been consulted for possible angiogram -Continue IV  antibiotics- vanc  and zosyn  4.  Diffuse macular rash on the extremities improving slowly  5.  Anemia of chronic disease-stable.  Slight drop noted.  Xarelto is on hold for procedure  6. DM-sliding scale coverage with insulin  7.  DVT prophylaxis-Xarelto on hold for procedure tomorrow    All the records are reviewed and case discussed with Care Management/Social Workerr. Management plans discussed with the patient, family and they are in agreement.  CODE STATUS: Full code  TOTAL  TIME TAKING CARE OF THIS PATIENT: 34 minutes.   POSSIBLE D/C IN 3-4 DAYS, DEPENDING ON CLINICAL CONDITION.   Ihor Austin M.D on 05/25/2018 at 12:40 PM  Between 7am to 6pm - Pager - [phone]  After 6pm go to [email or web address] - Social research officer, government  [facility]  Office  [phone]  CC: Primary care physician; Mickel Fuchs, MD

## 2018-05-25 NOTE — Progress Notes (Signed)
Patient started on insulin gtt after fbs continued to increase. Son was in room pacing back and forth, in and out room, using profanity and yelling into the phone. Was upsetting mother and was asked to leave so patient could rest. Patient was aggressive and said he didn't need explanations and left after slamming hall door. Security was notified that son should not return this shift. Family also had called and asked that he was removed from patient's room, since he was removed yesterday as well. Hypotensive when laying on her side, patient sleeps on her side. Continue to monitor.

## 2018-05-25 NOTE — Op Note (Signed)
Operative note   Surgeon: Dr. Recardo Evangelist, DPM.    Assistant: None    Preop diagnosis: 1.  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  Decubitus ulceration left heel to at least fatty tissue layer   Postop diagnosis: Same    Procedure:   1.  Incision and drainage deep abscess and removal of sesamoid bones and first metatarsal head from the first metatarsal phalangeal joint region   2.  Excisional debridement of decubitus ulcer with necrotic eschar measuring 3.1 cm x 3.2 cm with depth to the fatty tissue       EBL: 15 cc from the right foot only    Anesthesia:IV sedation delivered by the anesthesia team local anesthesia delivered by me with 14 cc of 0.  5% Marcaine plain    Hemostasis: Ankle tourniquet right ankle at 2 and 20 mils mercury pressure for 9 minutes no hemostasis was used on the left    Specimen: Culture from the tibial sesamoid bone and first metatarsal head region right foot.    Complications: None    Operative indications: Patient had a chronic wound on the right first metatarsal head without healing.  On the right foot.  Developed cellulitis and drainage from that area and has struggled to heal with it.  Wound is been present for several months.  On the left heel since I saw her about a month and a half ago she has developed a decubitus ulceration on the heel approximately 3.1 x 3.2 cm with necrotic eschar likely involving deeper tissues.    Procedure:  Patient was brought into the OR and placed on the operating table in thesupine position. After anesthesia was obtained theright and left lower extremity was prepped and draped in usual sterile fashion.  Operative Report: Initially to direct to the left heel where a combination of 15 scalpel blade and versa jet set at level 2 was used to debride and remove all the necrotic eschar superficially and into the deeper fatty tissue.  Did  not appear to penetrate down to the bone level..  No tourniquet was used and unfortunately she had very little bleeding to the region but we did remove the necrotic tissue and applied a wound VAC which will be set at 125 mils mercury continuous pressure.  On the right foot an incision been made by me 1 day ago in order to allow drainage to the region which has helped her quite a bit in reducing some of the cellulitis.  However there is still cellulitis and purulent drainage coming from the wound both at the metatarsal head and proximally.  There is necrotic eschar medially on the first metatarsal head the represents an area approximately 2 cm in diameter.  Debridement of this yields loss of most of the skin and subcutaneous tissue over the first metatarsal head.  Also probing of the plantar wound yields an ulceration of penetrates into the bone and joint level around the tibial sesamoid area and first metatarsal head region.  This is a breach all the way to the bone level.  At this point I debrided the eschar from the medial wound and incised the first metatarsal phalangeal joint medially and extending this into the previous incision on the plantar medial aspect to allow for incision and drainage of all infection to the region.  The area was probed into the deeper tissue the area deep to the plantar fascia was opened  and irrigated copiously.  Medial compartment of muscle tissue was also opened and some damaged tendon tissue was removed.  This is also copiously irrigated.  At this point I resected the metatarsal head due to its #1 likelihood of infection and #2 the contribution of pressure to the area that is preventing the wound from healing these number of months.  Also remove the sesamoids both tibial and fibular and cultured the tibial sesamoid since it appeared to be involved with infection.  Versa jet was utilized once again to remove all necrotic tissue both along the skin as well as in the deeper tissues.   Once this was accomplished the wound was closed with 3-0 Vicryl simple interrupted sutures.  The ulcer on the medial side was excised and I was able to close that primarily.  The area around the metatarsal phalangeal joint was packed with a sterile gauze soaked with saline as well.  At this point a sterile dressing was placed across the area consisting of 4 x 4's Kerlix ABD pads and a lightly wrapped Ace wrap.  On the left foot the wound VAC was prepped and placed and started and seemed to have good suction on that region.  This was protected also with an ABD pad and Kerlix.     Patient tolerated the procedure and anesthesia well.  Was transported from the OR to the PACU with all vital signs stable and vascular status intact. To be discharged per routine protocol.  Will follow up in approximately 1 week in the outpatient clinic.

## 2018-05-25 NOTE — Transfer of Care (Signed)
Immediate Anesthesia Transfer of Care Note  Patient: April [name]  Procedure(s) Performed: IRRIGATION AND DEBRIDEMENT FOOT application wound vac left  foot (Bilateral )  Patient Location: PACU  Anesthesia Type:General  Level of Consciousness: sedated  Airway & Oxygen Therapy: Patient Spontanous Breathing and Patient connected to face mask oxygen  Post-op Assessment: Report given to RN and Post -op Vital signs reviewed and stable  Post vital signs: Reviewed and stable  Last Vitals:  Vitals Value Taken Time  BP 95/53 05/25/2018  6:05 PM  Temp    Pulse 73 05/25/2018  6:06 PM  Resp 16 05/25/2018  6:06 PM  SpO2 100 % 05/25/2018  6:06 PM  Vitals shown include unvalidated device data.  Last Pain:  Vitals:   05/25/18 1200  TempSrc: Oral  PainSc:       Patients Stated Pain Goal: 3 (05/23/18 1531)  Complications: No apparent anesthesia complications

## 2018-05-25 NOTE — Progress Notes (Signed)
Pharmacy Antibiotic Note  April [name] is a 62 y.o. female admitted on 05/22/2018 with wound infection/DM foot infection.  Pharmacy has been consulted for vanc/zosyn dosing. Patient received vanc 1g, zosyn 3.375g, ceftriaxone 2g IV x 1. Patient also with UTI  (per MD note:Diffuse macular rash on the extremities-not itching.  Started 3 days prior to admission.  So could not be from antibiotics   Plan: Will continue vanc 500 mg IV q24h   Will draw trough 11/25 @ 2200 Will continue zosyn 3.375g IV q8h Ke 0.0342 T1/2 ~ 24 hrs Goal trough 15 - 20 mcg/mL  11/23- Patient received Lasix x 2 doses/ Watch renal fxn. Scr increased from 0.82 to 1.00. Watch- Zosyn/Vancomycin  11/24: Scr increase from 1.00 to 1.19. Will check Vancomycin trough today. Watch renal fxn on Zosyn and Vancomycin   Height: 5\' 2"  (157.5 cm) Weight: 101 lb 10.1 oz (46.1 kg) IBW/kg (Calculated) : 50.1  Temp (24hrs), Avg:98 F (36.7 C), Min:97.6 F (36.4 C), Max:98.4 F (36.9 C)  Recent Labs  Lab 05/22/18 2124 05/22/18 2342 05/23/18 0426 05/23/18 0520 05/23/18 0805 05/24/18 0451 05/25/18 0323  WBC 15.6*  --  12.4*  --   --  15.8* 10.9*  CREATININE 0.94  --  0.82  --   --  1.00 1.19*  LATICACIDVEN 2.5* 1.8  --  1.7 1.5  --   --     Estimated Creatinine Clearance: 35.2 mL/min (A) (by C-G formula based on SCr of 1.19 mg/dL (H)).    Allergies  Allergen Reactions  . Eggs Or Egg-Derived Products Diarrhea, Itching and Nausea And Vomiting    Vanc 11/22>> Zosyn 11/22>>  Thank you for allowing pharmacy to be a part of this patient's care.  Bari MantisKristin Kandis Henry PharmD Clinical Pharmacist 05/25/2018

## 2018-05-25 NOTE — Consult Note (Signed)
ANTICOAGULATION CONSULT NOTE   Pharmacy Consult for heparin drip management Indication: VTE prophylaxis  Patient Measurements: Height: 5\' 2"  (157.5 cm) Weight: 101 lb 10.1 oz (46.1 kg) IBW/kg (Calculated) : 50.1  Vital Signs: Temp: 97.6 F (36.4 C) (11/23 2000) Temp Source: Axillary (11/23 2000) BP: 92/47 (11/24 0500) Pulse Rate: 62 (11/24 0500)  Labs: Recent Labs    05/23/18 0426 05/24/18 0451 05/24/18 1150 05/24/18 1920 05/25/18 0323  HGB 9.3* 9.3*  --   --  8.9*  HCT 29.5* 30.3*  --   --  28.2*  PLT 305 315  --   --  310  APTT  --   --  34  --   --   LABPROT  --   --   --   --  14.9  INR  --   --   --   --  1.18  HEPARINUNFRC  --   --  0.21* 0.41 0.28*  CREATININE 0.82 1.00  --   --  1.19*    Estimated Creatinine Clearance: 35.2 mL/min (A) (by C-G formula based on SCr of 1.19 mg/dL (H)).   Medical History: Past Medical History:  Diagnosis Date  . Anxiety   . Colon polyps   . COPD (chronic obstructive pulmonary disease) (HCC)   . Diabetes mellitus without complication (HCC)   . DVT (deep venous thrombosis) (HCC)   . DVT (deep venous thrombosis) (HCC)   . History of colonic polyps   . Hyperlipidemia   . Peripheral artery disease (HCC)   . Pulmonary nodules/lesions, multiple 08/2012   NEGATIVE PET SCAN  . Retroperitoneal abscess (HCC)    history of in 04/2013  . Tobacco abuse     Assessment: 62 year old female with history of COPD, chronic tobacco abuse, DM, PVD, DVT on Xarelto diabetic foot ulcer.  There was a plan for debridement by podiatry earlier today.  While on the floor she was noticed to have desaturation and increased work of breathing and had to be transferred to intensive care unit because of requirement for titration of BiPAP.  Surgery was put on hold. There is no recent history of heparin administration to guide dosing. Baseline aPTT is 34s and HL is 0.21 IU/mL. Her last dose of Xarelto was last week at an unknown time. Low baseline anti-Xa  indicates that we can use anti-Xa's to monitor therapy  Goal of Therapy:  Heparin level 0.3-0.7 units/ml aPTT 66-102 seconds Monitor platelets by anticoagulation protocol: Yes   Plan:  11/24 @ 0330 HL 0.28 subtherapeutic. Will rebolus w/ heparin 700 units IV x 1 and increase rate to 850 units/hr and will recheck HL @ 1100. Hgb trending down will continue to monitor.  Thomasene Rippleavid  Ziv Welchel, PharmD Clinical Pharmacist 05/25/2018,5:25 AM

## 2018-05-25 NOTE — Anesthesia Post-op Follow-up Note (Signed)
Anesthesia QCDR form completed.        

## 2018-05-25 NOTE — H&P (Signed)
H and P has been reviewed and no changes are noted. Heparin drip stopped approximately 1hour pre op.

## 2018-05-26 ENCOUNTER — Encounter: Payer: Self-pay | Admitting: Podiatry

## 2018-05-26 ENCOUNTER — Inpatient Hospital Stay: Payer: Medicare HMO

## 2018-05-26 ENCOUNTER — Other Ambulatory Visit (INDEPENDENT_AMBULATORY_CARE_PROVIDER_SITE_OTHER): Payer: Self-pay | Admitting: Vascular Surgery

## 2018-05-26 ENCOUNTER — Encounter
Admission: EM | Disposition: A | Discharge status: Home Health | Payer: Self-pay | Source: Home / Self Care | Attending: Internal Medicine

## 2018-05-26 DIAGNOSIS — L97928 Non-pressure chronic ulcer of unspecified part of left lower leg with other specified severity: Secondary | ICD-10-CM

## 2018-05-26 DIAGNOSIS — I70249 Atherosclerosis of native arteries of left leg with ulceration of unspecified site: Secondary | ICD-10-CM

## 2018-05-26 HISTORY — PX: LOWER EXTREMITY ANGIOGRAPHY: CATH118251

## 2018-05-26 LAB — BASIC METABOLIC PANEL
ANION GAP: 11 (ref 5–15)
Anion gap: 8 (ref 5–15)
BUN: 57 mg/dL — ABNORMAL HIGH (ref 8–23)
BUN: 47 mg/dL — ABNORMAL HIGH (ref 8–23)
CHLORIDE: 99 mmol/L (ref 98–111)
CHLORIDE: 103 mmol/L (ref 98–111)
CO2: 24 mmol/L (ref 22–32)
CO2: 23 mmol/L (ref 22–32)
CREATININE: 1.24 mg/dL — AB (ref 0.44–1.00)
Calcium: 7.6 mg/dL — ABNORMAL LOW (ref 8.9–10.3)
Calcium: 7.5 mg/dL — ABNORMAL LOW (ref 8.9–10.3)
Creatinine, Ser: 1.4 mg/dL — ABNORMAL HIGH (ref 0.44–1.00)
GFR calc non Af Amer: 45 mL/min — ABNORMAL LOW (ref >60)
GFR calc non Af Amer: 39 mL/min — ABNORMAL LOW (ref >60)
GFR, EST AFRICAN AMERICAN: 52 mL/min — AB (ref >60)
GFR, EST AFRICAN AMERICAN: 45 mL/min — AB (ref >60)
GLUCOSE: 659 mg/dL — AB (ref 70–99)
Glucose, Bld: 237 mg/dL — ABNORMAL HIGH (ref 70–99)
Potassium: 5.2 mmol/L — ABNORMAL HIGH (ref 3.5–5.1)
Potassium: 4.3 mmol/L (ref 3.5–5.1)
Sodium: 131 mmol/L — ABNORMAL LOW (ref 135–145)
Sodium: 137 mmol/L (ref 135–145)

## 2018-05-26 LAB — CBC WITH DIFFERENTIAL/PLATELET
Abs Immature Granulocytes: 0.12 10*3/uL — ABNORMAL HIGH (ref 0.00–0.07)
BASOS ABS: 0 10*3/uL (ref 0.0–0.1)
Basophils Relative: 0 %
EOS ABS: 0 10*3/uL (ref 0.0–0.5)
EOS PCT: 0 %
HCT: 26 % — ABNORMAL LOW (ref 36.0–46.0)
HEMOGLOBIN: 8.3 g/dL — AB (ref 12.0–15.0)
Immature Granulocytes: 1 %
LYMPHS ABS: 0.7 10*3/uL (ref 0.7–4.0)
LYMPHS PCT: 6 %
MCH: 30.9 pg (ref 26.0–34.0)
MCHC: 31.9 g/dL (ref 30.0–36.0)
MCV: 96.7 fL (ref 80.0–100.0)
MONOS PCT: 3 %
Monocytes Absolute: 0.3 10*3/uL (ref 0.1–1.0)
NRBC: 0 % (ref 0.0–0.2)
Neutro Abs: 11.5 10*3/uL — ABNORMAL HIGH (ref 1.7–7.7)
Neutrophils Relative %: 90 %
Platelets: 292 10*3/uL (ref 150–400)
RBC: 2.69 MIL/uL — ABNORMAL LOW (ref 3.87–5.11)
RDW: 12.7 % (ref 11.5–15.5)
WBC: 12.6 10*3/uL — ABNORMAL HIGH (ref 4.0–10.5)

## 2018-05-26 LAB — GLUCOSE, CAPILLARY
GLUCOSE-CAPILLARY: 488 mg/dL — AB (ref 70–99)
GLUCOSE-CAPILLARY: 388 mg/dL — AB (ref 70–99)
GLUCOSE-CAPILLARY: 320 mg/dL — AB (ref 70–99)
GLUCOSE-CAPILLARY: 266 mg/dL — AB (ref 70–99)
GLUCOSE-CAPILLARY: 227 mg/dL — AB (ref 70–99)
GLUCOSE-CAPILLARY: 224 mg/dL — AB (ref 70–99)
GLUCOSE-CAPILLARY: 241 mg/dL — AB (ref 70–99)
GLUCOSE-CAPILLARY: 258 mg/dL — AB (ref 70–99)
Glucose-Capillary: 155 mg/dL — ABNORMAL HIGH (ref 70–99)
Glucose-Capillary: 158 mg/dL — ABNORMAL HIGH (ref 70–99)
Glucose-Capillary: 194 mg/dL — ABNORMAL HIGH (ref 70–99)
Glucose-Capillary: 205 mg/dL — ABNORMAL HIGH (ref 70–99)
Glucose-Capillary: 209 mg/dL — ABNORMAL HIGH (ref 70–99)
Glucose-Capillary: 225 mg/dL — ABNORMAL HIGH (ref 70–99)
Glucose-Capillary: 505 mg/dL (ref 70–99)
Glucose-Capillary: 561 mg/dL (ref 70–99)
Glucose-Capillary: 598 mg/dL (ref 70–99)

## 2018-05-26 LAB — PROTIME-INR
INR: 1.24
PROTHROMBIN TIME: 15.5 s — AB (ref 11.4–15.2)

## 2018-05-26 LAB — PHOSPHORUS: PHOSPHORUS: 5.8 mg/dL — AB (ref 2.5–4.6)

## 2018-05-26 LAB — URINE CULTURE: Culture: >=100000 — AB

## 2018-05-26 LAB — MAGNESIUM: Magnesium: 2.1 mg/dL (ref 1.7–2.4)

## 2018-05-26 SURGERY — LOWER EXTREMITY ANGIOGRAPHY
Anesthesia: Moderate Sedation | Laterality: Left

## 2018-05-26 MED ORDER — INSULIN REGULAR(HUMAN) IN NACL 100-0.9 UT/100ML-% IV SOLN
INTRAVENOUS | Status: DC
  Administered 2018-05-26: 4.1 [IU]/h via INTRAVENOUS
  Administered 2018-05-26: 5.4 [IU]/h via INTRAVENOUS
  Filled 2018-05-26 (×2): qty 100

## 2018-05-26 MED ORDER — HEPARIN SODIUM (PORCINE) 1000 UNIT/ML IJ SOLN
INTRAMUSCULAR | Status: DC | PRN
  Administered 2018-05-26: 4000 [IU] via INTRAVENOUS

## 2018-05-26 MED ORDER — DEXTROSE-NACL 5-0.45 % IV SOLN: INTRAVENOUS | Status: DC

## 2018-05-26 MED ORDER — HEPARIN SODIUM (PORCINE) 1000 UNIT/ML IJ SOLN
INTRAMUSCULAR | Status: AC
  Filled 2018-05-26: qty 1

## 2018-05-26 MED ORDER — VANCOMYCIN HCL IN DEXTROSE 1-5 GM/200ML-% IV SOLN
1000.0000 mg | INTRAVENOUS | Status: DC
  Administered 2018-05-26 – 2018-05-30 (×5): 1000 mg via INTRAVENOUS
  Filled 2018-05-26 (×5): qty 200

## 2018-05-26 MED ORDER — SODIUM CHLORIDE 0.9 % IV SOLN
INTRAVENOUS | Status: DC
  Administered 2018-05-26: 07:00:00 via INTRAVENOUS

## 2018-05-26 MED ORDER — INSULIN REGULAR(HUMAN) IN NACL 100-0.9 UT/100ML-% IV SOLN
INTRAVENOUS | Status: DC
  Administered 2018-05-27: 1.1 [IU]/h via INTRAVENOUS
  Filled 2018-05-26: qty 100

## 2018-05-26 MED ORDER — FENTANYL CITRATE (PF) 100 MCG/2ML IJ SOLN
INTRAMUSCULAR | Status: DC | PRN
  Administered 2018-05-26 (×2): 25 ug via INTRAVENOUS

## 2018-05-26 MED ORDER — HEPARIN (PORCINE) IN NACL 1000-0.9 UT/500ML-% IV SOLN
INTRAVENOUS | Status: AC
  Filled 2018-05-26: qty 1000

## 2018-05-26 MED ORDER — METHYLPREDNISOLONE SODIUM SUCC 40 MG IJ SOLR
40.0000 mg | Freq: Two times a day (BID) | INTRAMUSCULAR | Status: DC
  Administered 2018-05-26 – 2018-05-27 (×3): 40 mg via INTRAVENOUS
  Filled 2018-05-26 (×3): qty 1

## 2018-05-26 MED ORDER — IOPAMIDOL (ISOVUE-300) INJECTION 61%
INTRAVENOUS | Status: DC | PRN
  Administered 2018-05-26: 80 mL via INTRA_ARTERIAL

## 2018-05-26 MED ORDER — LIDOCAINE-EPINEPHRINE (PF) 1 %-1:200000 IJ SOLN
INTRAMUSCULAR | Status: AC
  Filled 2018-05-26: qty 10

## 2018-05-26 MED ORDER — MIDAZOLAM HCL 2 MG/2ML IJ SOLN
INTRAMUSCULAR | Status: DC | PRN
  Administered 2018-05-26: 1 mg via INTRAVENOUS

## 2018-05-26 MED ORDER — METHYLPREDNISOLONE SODIUM SUCC 125 MG IJ SOLR: 125.0000 mg | INTRAMUSCULAR | Status: DC | PRN

## 2018-05-26 MED ORDER — NITROGLYCERIN 5 MG/ML IV SOLN
INTRAVENOUS | Status: AC
  Filled 2018-05-26: qty 10

## 2018-05-26 MED ORDER — DEXTROSE 50 % IV SOLN: 25.0000 mL | INTRAVENOUS | Status: DC | PRN

## 2018-05-26 MED ORDER — FENTANYL CITRATE (PF) 100 MCG/2ML IJ SOLN
INTRAMUSCULAR | Status: AC
  Filled 2018-05-26: qty 2

## 2018-05-26 MED ORDER — SODIUM CHLORIDE 0.9 % IV SOLN
INTRAVENOUS | Status: DC | PRN
  Administered 2018-05-26: 250 mL via INTRAVENOUS

## 2018-05-26 MED ORDER — SODIUM CHLORIDE 0.9 % IV SOLN: INTRAVENOUS | Status: DC

## 2018-05-26 MED ORDER — ONDANSETRON HCL 4 MG/2ML IJ SOLN: 4.0000 mg | Freq: Four times a day (QID) | INTRAMUSCULAR | Status: DC | PRN

## 2018-05-26 MED ORDER — MIDAZOLAM HCL 5 MG/5ML IJ SOLN
INTRAMUSCULAR | Status: AC
  Filled 2018-05-26: qty 5

## 2018-05-26 MED ORDER — FAMOTIDINE 20 MG PO TABS: 40.0000 mg | ORAL_TABLET | ORAL | Status: DC | PRN

## 2018-05-26 MED ORDER — HYDROMORPHONE HCL 1 MG/ML IJ SOLN: 1.0000 mg | Freq: Once | INTRAMUSCULAR | Status: DC | PRN

## 2018-05-26 MED ORDER — INSULIN REGULAR BOLUS VIA INFUSION
0.0000 [IU] | Freq: Three times a day (TID) | INTRAVENOUS | Status: DC
  Filled 2018-05-26: qty 10

## 2018-05-26 MED ORDER — DEXTROSE-NACL 5-0.45 % IV SOLN
INTRAVENOUS | Status: DC
  Administered 2018-05-26 – 2018-05-27 (×2): via INTRAVENOUS
  Administered 2018-05-27: 1000 mL via INTRAVENOUS
  Administered 2018-05-28 (×2): via INTRAVENOUS

## 2018-05-26 MED ORDER — NITROGLYCERIN 1 MG/10 ML FOR IR/CATH LAB
INTRA_ARTERIAL | Status: DC | PRN
  Administered 2018-05-26: 250 ug

## 2018-05-26 MED ORDER — SODIUM CHLORIDE FLUSH 0.9 % IV SOLN
INTRAVENOUS | Status: AC
  Filled 2018-05-26: qty 40

## 2018-05-26 MED ORDER — INSULIN REGULAR BOLUS VIA INFUSION
0.0000 [IU] | Freq: Three times a day (TID) | INTRAVENOUS | Status: DC
  Administered 2018-05-26: 5.3 [IU] via INTRAVENOUS
  Filled 2018-05-26: qty 10

## 2018-05-26 MED ORDER — SODIUM CHLORIDE (PF) 0.9 % IJ SOLN
INTRAMUSCULAR | Status: AC
  Filled 2018-05-26: qty 50

## 2018-05-26 MED ORDER — CEFAZOLIN SODIUM-DEXTROSE 2-4 GM/100ML-% IV SOLN
2.0000 g | INTRAVENOUS | Status: DC
  Filled 2018-05-26: qty 100

## 2018-05-26 SURGICAL SUPPLY — 21 items
BALLN LUTONIX 018 4X220X130 (BALLOONS) ×6
BALLN LUTONIX 018 5X220X130 (BALLOONS) ×3
BALLN ULTRVRSE 018 2.5X150X150 (BALLOONS) ×3
BALLOON LUTONIX 018 4X220X130 (BALLOONS) ×2 IMPLANT
BALLOON LUTONIX 018 5X220X130 (BALLOONS) ×1 IMPLANT
BALLOON ULTRVS 018 2.5X150X150 (BALLOONS) ×1 IMPLANT
CATH BEACON 5 .038 100 VERT TP (CATHETERS) ×3 IMPLANT
CATH CXI SUPP ANG 4FR 135 (CATHETERS) ×1 IMPLANT
CATH CXI SUPP ANG 4FR 135CM (CATHETERS) ×3
CATH PIG 70CM (CATHETERS) ×3 IMPLANT
DEVICE PRESTO INFLATION (MISCELLANEOUS) ×3 IMPLANT
DEVICE STARCLOSE SE CLOSURE (Vascular Products) ×3 IMPLANT
GLIDEWIRE ADV .035X260CM (WIRE) ×3 IMPLANT
PACK ANGIOGRAPHY (CUSTOM PROCEDURE TRAY) ×3 IMPLANT
SHEATH ANL2 6FRX45 HC (SHEATH) ×3 IMPLANT
SHEATH BRITE TIP 5FRX11 (SHEATH) ×3 IMPLANT
STENT VIABAHN 5X250X120 (Permanent Stent) ×3 IMPLANT
STENT VIABAHN 6X150X120 (Permanent Stent) ×3 IMPLANT
TUBING CONTRAST HIGH PRESS 72 (TUBING) ×3 IMPLANT
WIRE G V18X300CM (WIRE) ×3 IMPLANT
WIRE J 3MM .035X145CM (WIRE) ×3 IMPLANT

## 2018-05-26 NOTE — Progress Notes (Signed)
RN called and spoke with Dr. Driscilla Grammesew's nurse and asked about time of procedure and that patient is begging for coffee.  Dr. Wyn Quakerew gave order that patient can have one cup of black coffee with NO cream.

## 2018-05-26 NOTE — Progress Notes (Signed)
Dr. Duanne LimerickSamaan gave order for carb modified diet and MD gave order to cover carbs with glucosestabilizer.

## 2018-05-26 NOTE — Progress Notes (Deleted)
Discussed with Dr. Samaan and Dana, NP that patient has been slightly hoarse since returning from EGD where he was intubated and that he keeps coughing and using Yankauer to suction what he's coughing up.  Patient also clears throat and coughs more when taking sips.  Dr. Samaan gave order to not give GoLYTELY due to possible aspiration.   

## 2018-05-26 NOTE — H&P (Signed)
[facility] History & Physical Update  The patient was interviewed and re-examined.  The patient's previous History and Physical has been reviewed and is unchanged.  There is no change in the plan of care. We plan to proceed with the scheduled procedure.  Festus BarrenJason Dew, MD  05/26/2018, 2:33 PM

## 2018-05-26 NOTE — Progress Notes (Signed)
[facility] [place] [facility]   PATIENT NAME: April [name]    MR#:  [id number]  DATE OF BIRTH:  06-28-1956  SUBJECTIVE:  CHIEF COMPLAINT:   Chief Complaint  Patient presents with  . Wound Check  Patient seen and evaluated today Underwent debridement of the right foot yesterday by podiatry Patient tolerated procedure well No complaints of any shortness of breath Completely weaned off oxygen  REVIEW OF SYSTEMS:  Review of Systems  Constitutional: Malaise, weakness HENT: Negative.   Eyes: Negative.   Respiratory: Shortness of breath improved, wheezing improved Cardiovascular: Palpitations Gastrointestinal: Negative.   Genitourinary: Negative.   Musculoskeletal: Negative.   Skin: Right foot wound Neurological: Negative.   Endo/Heme/Allergies: Negative.   Psychiatric/Behavioral: Negative.   All other systems reviewed and are negative.  DRUG ALLERGIES:   Allergies  Allergen Reactions  . Eggs Or Egg-Derived Products Diarrhea, Itching and Nausea And Vomiting    VITALS:  Blood pressure (!) 111/55, pulse 87, temperature 98 F (36.7 C), temperature source Oral, resp. rate 19, height 5\' 2"  (1.575 m), weight 46.1 kg, SpO2 97 %.  PHYSICAL EXAMINATION:  Physical Exam  GENERAL:  62 y.o.-year-old patient lying in the bed with no acute distress.  Malnourished EYES: Pupils equal, round, reactive to light and accommodation. No scleral icterus. Extraocular muscles intact.  HEENT: Head atraumatic, normocephalic. Oropharynx and nasopharynx clear.  NECK:  Supple, no jugular venous distention. No thyroid enlargement, no tenderness.  LUNGS: Improved breath sounds bilaterally, bibasilar crepitations decreased Weaned off oxygen CARDIOVASCULAR: S1, S2 tachycardia noted. No murmurs, rubs, or gallops.  ABDOMEN: Soft, nontender, nondistended. Bowel sounds present. No organomegaly or mass.  EXTREMITIES: No pedal edema, cyanosis, or clubbing.  Unable to palpate  dorsalis pedis pulses. Ischemic ulcer left heel, also ulcer on the plantar surface of fifth metatarsal NEUROLOGIC: Cranial nerves II through XII are intact. Muscle strength 5/5 in all extremities. Sensation intact. Gait not checked.  PSYCHIATRIC: The patient is alert and oriented x 3.  SKIN: Diffuse macular rash noted on upper and lower extremities including the palms.  Denies any itching   LABORATORY PANEL:   CBC Recent Labs  Lab 05/26/18 0520  WBC 12.6*  HGB 8.3*  HCT 26.0*  PLT 292   ------------------------------------------------------------------------------------------------------------------  Chemistries  Recent Labs  Lab 05/22/18 2124  05/26/18 0520  NA 132*   < > 131*  K 5.3*   < > 5.2*  CL 95*   < > 99  CO2 32   < > 24  GLUCOSE 513*   < > 659*  BUN 20   < > 57*  CREATININE 0.94   < > 1.24*  CALCIUM 8.3*   < > 7.6*  MG  --   --  2.1  AST 15  --   --   ALT 10  --   --   ALKPHOS 108  --   --   BILITOT 0.3  --   --    < > = values in this interval not displayed.   ------------------------------------------------------------------------------------------------------------------  Cardiac Enzymes No results for input(s): TROPONINI in the last 168 hours. ------------------------------------------------------------------------------------------------------------------  RADIOLOGY:  Dg Chest Port 1 View  Result Date: 05/26/2018 CLINICAL DATA:  Follow-up pneumonia EXAM: PORTABLE CHEST 1 VIEW COMPARISON:  05/24/2018 FINDINGS: Cardiac shadow is stable. Aortic calcifications are again seen. The lungs are well aerated bilaterally with persistent interstitial edema. Small right-sided pleural effusion is now noted. No focal infiltrate is seen. IMPRESSION: Persistent mild CHF with  new right-sided pleural effusion. Electronically Signed   By: Alcide CleverMark  Lukens M.D.   On: 05/26/2018 08:54    EKG:   Orders placed or performed during the hospital encounter of 06/05/16  . ED EKG   . ED EKG  . EKG    ASSESSMENT AND PLAN:   62 year old female with past medical history significant for peripheral vascular disease, ongoing smoking, COPD, diabetes presents to hospital secondary to nonhealing bilateral leg wounds.  1.  Status post hypoxic hypercapnic respiratory failure  Weaned of BiPAP and oxygen via nasal cannula  2. Acute pulmonary edema with fluid overload improved IV Lasix for diuresis as needed Monitor input output  3.  Osteomyelitis right foot  & Left foot wounds -Appreciate podiatry consult.   -Had I&D yesterday.  Incision and drainage of deep abscess and removal of sesamoid bones and first metatarsal head from the first metatarsal phalangeal joint region was done yesterday.  Excisional debridement of decubitus ulcer with necrotic eschar measuring 3.1 cm/3.2 cm with depth to the fatty tissue was done yesterday. -Angiogram study by vascular surgery today to assess blood flow -Continue IV antibiotics -IV  zosyn  4.  Diffuse macular rash on the extremities improving   5.  Anemia of chronic disease-stable.   Monitor hb and hct  6. DM-sliding scale coverage with insulin  7.  DVT prophylaxis-Xarelto resumed  All the records are reviewed and case discussed with Care Management/Social Workerr. Management plans discussed with the patient, family and they are in agreement.  CODE STATUS: Full code  TOTAL  TIME TAKING CARE OF THIS PATIENT: 38 minutes.   POSSIBLE D/C IN 3-4 DAYS, DEPENDING ON CLINICAL CONDITION.   Ihor AustinPavan Emmett Bracknell M.D on 05/26/2018 at 11:47 AM  Between 7am to 6pm - Pager - [phone]  After 6pm go to [email or web address] - Social research officer, governmentpassword EPAS [email or web address]  [facility]  Office  [phone]  CC: Primary care physician; Mickel FuchsWroth, Thomas H, MD

## 2018-05-26 NOTE — Progress Notes (Signed)
Patient transferred to special procedures and report given to Revonda StandardAllison, RN at bedside.

## 2018-05-26 NOTE — Progress Notes (Signed)
[facility]  Daily Progress Note  Plan: Left Lower Extremity Angiogram with Possible Intervention with Dr. Wyn Quakerew (Monday 05/26/18)  Right Lower Extremity Angiogram with Possible Intervention with Dr. Gilda CreaseSchnier (Tuesday 05/27/18)  Will pre-op.  Cleda DaubKimberly Adolph Clutter PA-C 05/26/2018 1:35 PM

## 2018-05-26 NOTE — Progress Notes (Addendum)
Name: April [name] MRN: [id number] DOB: Nov 16, 1955     CONSULTATION DATE: 05/22/2018  Subjective & objectives: Had to be started on insulin drip because of uncontrolled diabetes mellitus.  PAST MEDICAL HISTORY :   has a past medical history of Anxiety, Colon polyps, COPD (chronic obstructive pulmonary disease) (HCC), Diabetes mellitus without complication (HCC), DVT (deep venous thrombosis) (HCC), DVT (deep venous thrombosis) (HCC), History of colonic polyps, Hyperlipidemia, Peripheral artery disease (HCC), Pulmonary nodules/lesions, multiple (08/2012), Retroperitoneal abscess (HCC), and Tobacco abuse.  has a past surgical history that includes Colonoscopy (05/2007); Cholecystectomy; and Irrigation and debridement foot (Bilateral, 05/25/2018). Prior to Admission medications   Medication Sig Start Date End Date Taking? Authorizing Provider  aspirin 81 MG chewable tablet Chew 81 mg by mouth daily. 05/30/17  Yes [provider]  atorvastatin (LIPITOR) 40 MG tablet Take 40 mg by mouth daily. 10/24/17 10/24/18 Yes [provider]  Fluticasone-Salmeterol (ADVAIR DISKUS) 250-50 MCG/DOSE AEPB Inhale 1 puff into the lungs 2 (two) times daily. 03/14/15 01/16/26 Yes Beers, Charmayne Sheerharles M, PA-C  pantoprazole (PROTONIX) 40 MG tablet Take 40 mg by mouth 2 (two) times daily.  04/25/18  Yes [provider]  rivaroxaban (XARELTO) 10 MG TABS tablet Take 10 mg by mouth every evening.   Yes [provider]  SANTYL ointment Apply 1 application topically daily.  05/19/18  Yes [provider]  sodium hypochlorite (DAKIN'S 1/4 STRENGTH) 0.125 % SOLN Apply 1 application topically daily. Use as a wound cleanser, pat dry, then apply dressings 12/18/17  Yes [provider]  SPIRIVA HANDIHALER 18 MCG inhalation capsule Place 18 mcg into inhaler and inhale daily.  05/19/18  Yes [provider]  albuterol (PROVENTIL HFA;VENTOLIN HFA) 108 (90 Base) MCG/ACT inhaler  Inhale 2 puffs into the lungs every 6 (six) hours as needed for wheezing or shortness of breath.    [provider]  albuterol (PROVENTIL) (2.5 MG/3ML) 0.083% nebulizer solution Inhale 2.5 mg into the lungs every 6 (six) hours as needed for wheezing or shortness of breath.     [provider]  guaiFENesin-dextromethorphan (ROBITUSSIN DM) 100-10 MG/5ML syrup Take 5 mLs by mouth every 4 (four) hours as needed for cough. 04/11/18   Milagros LollSudini, Srikar, MD  oxyCODONE-acetaminophen (PERCOCET) 10-325 MG tablet Take 1 tablet by mouth every 6 (six) hours as needed for pain.     [provider]   Allergies  Allergen Reactions  . Eggs Or Egg-Derived Products Diarrhea, Itching and Nausea And Vomiting    FAMILY HISTORY:  family history includes Brain cancer in her mother. SOCIAL HISTORY:  reports that she has been smoking cigarettes. She has a 70.50 pack-year smoking history. She has never used smokeless tobacco. She reports that she does not drink alcohol or use drugs.  REVIEW OF SYSTEMS:   Unable to obtain due to critical illness   VITAL SIGNS: Temp:  [97.6 F (36.4 C)-98.1 F (36.7 C)] 97.6 F (36.4 C) (11/25 1724) Pulse Rate:  [71-99] 89 (11/25 1900) Resp:  [6-24] 13 (11/25 1900) BP: (92-118)/(47-87) 111/64 (11/25 1900) SpO2:  [92 %-100 %] 96 % (11/25 1900)  Physical Examination: awake, oriented, no focal motor deficits Tolerating RA/ [place], no distress, bilateral equal air entry with diffuse expiratory wheezes S1 & S2 are audible with no murmur Benign abdominal exam was normal peristalsis dressed right foot ulcer and left foot ulcer   ASSESSMENT / PLAN:  Acute respiratory failure with hypoxemia and hypercapnia (improved). Tolerating RA/[place] -Monitor O2 sat and work  of breathing  Acute exacerbation of COPD -Optimize bronchodilators + inhaled steroids + tapering systemic steroids + empiric antibiotics  Atelectasis and possible pneumonia with pulmonary  congestion. worsenig Right lower airspace disease -Zosyn + vancomycin. Monitor CXR + CBC + FiO2  AKI with intravascular volume depletion -Optimize volume, monitor renal panel and urine out put.  Right diabetic foot with diabetic ulcer. No osteomyelitis on MRI.BLD culture 05/22/2018 remains -ve  On 05/26/2018 patient underwent percutaneous transluminal angioplasty of left SFA and popliteal arteries. -Zosyn + vancomycin.  Follow with wound culture -Management as per vascular surgery and podiatry  Diabetes mellitus -Optimize glycemic control -Optimize Lantus and taper Insulin gtt.  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 Xarelto on hold for planned surgery and will bridge with heparin drip  Anemia -Keep hemoglobin more than 7 g/dL  UTI.  MRSA -ABX as above and follows culture  Full code  DVT + GI prophylaxis. Continue supportive care  Critical care time 45 minutes

## 2018-05-26 NOTE — Progress Notes (Signed)
Critical glucose of 659 called from lab. Maggie, NP notified. New orders for insulin gtt and IVF entered. Insulin gtt started per policy and IVF per order. Initial CBG 598 and gtt started per glucose stabilizer (ICU glycemic control).

## 2018-05-26 NOTE — Anesthesia Postprocedure Evaluation (Signed)
Anesthesia Post Note  Patient: Marybelle KillingsDoris J Bondarenko  Procedure(s) Performed: IRRIGATION AND DEBRIDEMENT FOOT application wound vac left  foot (Bilateral )  Patient location during evaluation: ICU Anesthesia Type: General Level of consciousness: awake and alert Pain management: pain level controlled Vital Signs Assessment: post-procedure vital signs reviewed and stable Respiratory status: spontaneous breathing Cardiovascular status: blood pressure returned to baseline and stable Postop Assessment: no headache Anesthetic complications: no     Last Vitals:  Vitals:   05/26/18 0700 05/26/18 0800  BP: (!) 98/48 (!) 103/55  Pulse: 72 81  Resp: 14 15  Temp:    SpO2: 98% 98%    Last Pain:  Vitals:   05/26/18 0030  TempSrc: Oral  PainSc: 0-No pain                 Vernie MurdersPope,  Jessaca Philippi G

## 2018-05-26 NOTE — Care Management Note (Signed)
Case Management Note  Patient Details  Name: April [name] MRN: [id number] Date of Birth: January 14, 1956  Subjective/Objective:     Patient admitted with acute osteomyelitis originally treated at [facility] for ulcerations of right foot and left heel, but now seeing Dr. Alberteen Spindleline with [facility].  Patient is from home and lives with her long time partner April [name], goes by April [name], they have been together 22 years.  Daughter [place], granddaughter, and April [name] are at the bedside.  Patient is very hard of hearing.  Patient is independent in ADL's.  April [name] or her ex husband provide transportation for appointments patient does not drive.  PCP verified as Dr. Butler DenmarkWroth, last appointment was for this past Friday but patient was in the hospital, she will reschedule when discharged.  [place]arm[place] in [place].  Patient has a walker and a care at home.  Patient is open with [facility], has RN for wound care and the RN comes out 3 times per week.  RNCM consult for lovenox at discharge, patient is currently on xarelto at home.  Patient is being followed by Vascular surgery.  Patient reports that she is able to afford her medications.  RNCM will cont to follow for discharge planning needs.                  Robbie LisJeanna Makhai Fulco RN BSN [phone]  Action/Plan:   Expected Discharge Date:                  Expected Discharge Plan:  Home w Home Health Services  In-House Referral:     Discharge planning Services  CM Consult  Post Acute Care Choice:    Choice offered to:     DME Arranged:    DME Agency:     HH Arranged:    HH Agency:  [facility]  Status of Service:  In process, will continue to follow  If discussed at Long Length of Stay Meetings, dates discussed:    Additional Comments:  Allayne ButcherJeanna M Josey Dettmann, RN 05/26/2018, 11:01 AM

## 2018-05-26 NOTE — Op Note (Signed)
[facility]  Percutaneous Study/Intervention Procedural Note   Date of Surgery: 05/26/2018  Surgeon(s):Diann Bangerter    Assistants:none  Pre-operative Diagnosis: PAD with ulceration and infection bilateral lower extremities  Post-operative diagnosis:  Same  Procedure(s) Performed:             1.  Ultrasound guidance for vascular access right femoral artery             2.  Catheter placement into left common femoral artery from right femoral approach             3.  Aortogram and selective left lower extremity angiogram             4.  Percutaneous transluminal angioplasty of left anterior tibial artery and dorsalis pedis artery with 2.5 mm diameter by 15 cm length angioplasty balloon             5.   Percutaneous transluminal angioplasty of the entire left SFA and popliteal arteries with 4 mm diameter by 22 cm length Lutonix drug-coated angioplasty balloons x2  6.  Viabahn covered stent placement x2 to the left SFA and popliteal arteries using 5 mm diameter by 25 cm length stent distally and a 6 mm x 15 cm length stent proximally.  7.  Percutaneous transluminal angioplasty of the left common femoral artery with 5 mm diameter Lutonix drug-coated angioplasty balloon             8.  StarClose closure device right femoral artery  EBL: 5 cc  Contrast: 80 cc  Fluoro Time: 8.8 minutes  Moderate Conscious Sedation Time: approximately 60 minutes using 1 mg of Versed and 50 Mcg of Fentanyl              Indications:  Patient is a 62 y.o.female with nonhealing ulcerations and infection to both lower extremities. The patient is brought in for angiography for further evaluation and potential treatment.  Due to the limb threatening nature of the situation, angiogram was performed for attempted limb salvage. The patient is aware that if the procedure fails, amputation would be expected.  The patient also understands that even with successful revascularization, amputation may  still be required due to the severity of the situation.  Risks and benefits are discussed and informed consent is obtained.   Procedure:  The patient was identified and appropriate procedural time out was performed.  The patient was then placed supine on the table and prepped and draped in the usual sterile fashion. Moderate conscious sedation was administered during a face to face encounter with the patient throughout the procedure with my supervision of the RN administering medicines and monitoring the patient's vital signs, pulse oximetry, telemetry and mental status throughout from the start of the procedure until the patient was taken to the recovery room. Ultrasound was used to evaluate the right common femoral artery.  It was patent but diseased.  A digital ultrasound image was acquired.  A Seldinger needle was used to access the right common femoral artery under direct ultrasound guidance and a permanent image was performed.  A 0.035 J wire was advanced without resistance and a 5Fr sheath was placed.  Pigtail catheter was placed into the aorta and an AP aortogram was performed. This demonstrated what appeared to be at least moderate renal artery stenosis bilaterally.  The distal aorta had a 65-70% stenosis.  The iliac arteries were mildly irregular after the distal aorta stenosis but no other stenosis was seen after this. I  then crossed the aortic bifurcation and advanced to the left femoral head. Selective left lower extremity angiogram was then performed. This demonstrated 90 to 95% stenosis of the left common femoral artery.  The left profunda femoris artery had at least a 50 to 60% stenosis beyond its origin.  The left SFA occluded after a short stump.  This reconstituted a popliteal artery above the knee and what appeared to be two-vessel runoff distally although there was then occlusion of the anterior tibial artery distally just above the ankle although it did reconstitute in the foot.  The  posterior tibial artery also occluded at the level of the ankle but had good collaterals into the foot without clear distal reconstitution. It was felt that it was in the patient's best interest to proceed with intervention after these images to avoid a second procedure and a larger amount of contrast and fluoroscopy based off of the findings from the initial angiogram.  It was also elected not to treat the distal aortic stenosis as this would require kissing stents in both common iliac arteries making up and over crossing much more difficult, and she still needs her right leg treated later this week.  This may be considered at that time depending on her infrainguinal disease on the right and how much work and contrast will be used during that procedure.  The patient was systemically heparinized and a 6 [place] Ansell sheath was then placed over the [employer] wire. I then used a Kumpe catheter and the advantage wire to navigate through the high-grade common femoral stenosis in the long SFA and popliteal occlusion.  I then confirmed intraluminal flow in the below-knee popliteal artery and exchanged for a 0.018 wire.  Using a CXI catheter and the V 18 wire was able to navigate down into the anterior tibial artery.  Selective imaging showed the occlusion in the distal anterior tibial artery.  I was able to easily cross this and parked the wire down in the foot.  A 2.5 mm diameter by 15 cm length angioplasty balloon was then inflated from the mid foot up across the ankle and the distal anterior tibial artery to encompass the occlusion.  This was inflated to 10 atm for 1 minute.  Completion imaging showed improved flow into the foot although there was some spasm distally at and below the area treated with angioplasty.  There appeared to be less than 20% residual stenosis in the area treated.  I then turned my attention to the posterior tibial artery.  The CXI catheter and the V 18 wire were advanced down into the  posterior tibial artery and selective imaging was performed.  This showed a well collateralized occlusion at the ankle without a distal reconstitution so no attempt at treatment was made on the posterior tibial artery.  I then used 4 mm diameter by 22 cm length Lutonix drug-coated angioplasty balloon from the below-knee popliteal artery up to the distal SFA.  This was 10 atm for 1 minute on the inflation.  A second 4 mm diameter by 22 cm length Lutonix drug-coated angioplasty balloon was then used to treat the proximal and mid SFA with an inflation to 12 atm for 1 minute completion imaging showed multiple areas of high-grade residual stenosis throughout the SFA and the popliteal artery.  I basically used stents to cover almost the entire area treated in the SFA and popliteal arteries.  For the distal portion, a 5 mm diameter by 25 cm length Viabahn stent was  deployed from the below-knee popliteal artery up to the mid to distal SFA.  A 6 mm diameter by 15 cm length Viabahn stent was then deployed from the origin of the SFA down into the previous placed stent.  These were postdilated with 4 mm balloons.  I then elected to treat the common femoral artery as well as the proximal portion of the SFA which was treated with a covered stent with a 5 mm diameter by 22 cm length Lutonix drug-coated angioplasty balloon.  This was inflated to 8 atm for 1 minute.  Completion imaging showed less than 10% residual stenosis in the SFA and popliteal arteries.  The distal runoff was initially sluggish, but after some nitroglycerin there was brisk flow distally consistent with vasospasm and not stenosis or thrombosis.  There was still about a 60 to 70% residual stenosis in the left common femoral artery, but this would be in an area where stent placement would be contraindicated and so this was left for now.  If she still has wound healing issues, we may have to consider femoral endarterectomy in the future.  This is also the side that  she is going to have access tomorrow to treat her right leg so stent placement would be contraindicated for that reason as well. I elected to terminate the procedure. The sheath was removed and StarClose closure device was deployed in the right femoral artery with excellent hemostatic result. The patient was taken to the recovery room in stable condition having tolerated the procedure well.  Findings:               Aortogram:  what appeared to be at least moderate renal artery stenosis bilaterally.  The distal aorta had a 65-70% stenosis.  The iliac arteries were mildly irregular after the distal aorta stenosis but no other stenosis was seen after this.             Left lower Extremity:  90 to 95% stenosis of the left common femoral artery.  The left profunda femoris artery had at least a 50 to 60% stenosis beyond its origin.  The left SFA occluded after a short stump.  This reconstituted a popliteal artery above the knee and what appeared to be two-vessel runoff distally although there was then occlusion of the anterior tibial artery distally just above the ankle although it did reconstitute in the foot.  The posterior tibial artery also occluded at the level of the ankle but had good collaterals into the foot without clear distal reconstitution   Disposition: Patient was taken to the recovery room in stable condition having tolerated the procedure well.  Complications: None  Leotis Pain 05/26/2018 4:04 PM   This note was created with Dragon Medical transcription system. Any errors in dictation are purely unintentional.

## 2018-05-27 ENCOUNTER — Encounter
Admission: EM | Disposition: A | Discharge status: Home Health | Payer: Self-pay | Source: Home / Self Care | Attending: Internal Medicine

## 2018-05-27 ENCOUNTER — Encounter: Payer: Self-pay | Admitting: Vascular Surgery

## 2018-05-27 DIAGNOSIS — M869 Osteomyelitis, unspecified: Secondary | ICD-10-CM

## 2018-05-27 DIAGNOSIS — I70249 Atherosclerosis of native arteries of left leg with ulceration of unspecified site: Secondary | ICD-10-CM

## 2018-05-27 DIAGNOSIS — L97928 Non-pressure chronic ulcer of unspecified part of left lower leg with other specified severity: Secondary | ICD-10-CM

## 2018-05-27 DIAGNOSIS — I70239 Atherosclerosis of native arteries of right leg with ulceration of unspecified site: Secondary | ICD-10-CM

## 2018-05-27 DIAGNOSIS — L97918 Non-pressure chronic ulcer of unspecified part of right lower leg with other specified severity: Secondary | ICD-10-CM

## 2018-05-27 HISTORY — PX: LOWER EXTREMITY ANGIOGRAPHY: CATH118251

## 2018-05-27 LAB — BASIC METABOLIC PANEL
Anion gap: 4 — ABNORMAL LOW (ref 5–15)
BUN: 42 mg/dL — AB (ref 8–23)
CHLORIDE: 106 mmol/L (ref 98–111)
CO2: 26 mmol/L (ref 22–32)
CREATININE: 1.2 mg/dL — AB (ref 0.44–1.00)
Calcium: 7.4 mg/dL — ABNORMAL LOW (ref 8.9–10.3)
GFR calc Af Amer: 55 mL/min — ABNORMAL LOW (ref >60)
GFR calc non Af Amer: 47 mL/min — ABNORMAL LOW (ref >60)
Glucose, Bld: 120 mg/dL — ABNORMAL HIGH (ref 70–99)
Potassium: 3.9 mmol/L (ref 3.5–5.1)
SODIUM: 136 mmol/L (ref 135–145)

## 2018-05-27 LAB — CBC
HCT: 26.3 % — ABNORMAL LOW (ref 36.0–46.0)
Hemoglobin: 8.1 g/dL — ABNORMAL LOW (ref 12.0–15.0)
MCH: 29.7 pg (ref 26.0–34.0)
MCHC: 30.8 g/dL (ref 30.0–36.0)
MCV: 96.3 fL (ref 80.0–100.0)
NRBC: 0 % (ref 0.0–0.2)
PLATELETS: 293 10*3/uL (ref 150–400)
RBC: 2.73 MIL/uL — ABNORMAL LOW (ref 3.87–5.11)
RDW: 12.5 % (ref 11.5–15.5)
WBC: 11.6 10*3/uL — AB (ref 4.0–10.5)

## 2018-05-27 LAB — GLUCOSE, CAPILLARY
GLUCOSE-CAPILLARY: 89 mg/dL (ref 70–99)
GLUCOSE-CAPILLARY: 155 mg/dL — AB (ref 70–99)
GLUCOSE-CAPILLARY: 199 mg/dL — AB (ref 70–99)
GLUCOSE-CAPILLARY: 211 mg/dL — AB (ref 70–99)
Glucose-Capillary: 206 mg/dL — ABNORMAL HIGH (ref 70–99)
Glucose-Capillary: 173 mg/dL — ABNORMAL HIGH (ref 70–99)
Glucose-Capillary: 141 mg/dL — ABNORMAL HIGH (ref 70–99)
Glucose-Capillary: 102 mg/dL — ABNORMAL HIGH (ref 70–99)
Glucose-Capillary: 114 mg/dL — ABNORMAL HIGH (ref 70–99)
Glucose-Capillary: 117 mg/dL — ABNORMAL HIGH (ref 70–99)
Glucose-Capillary: 146 mg/dL — ABNORMAL HIGH (ref 70–99)
Glucose-Capillary: 115 mg/dL — ABNORMAL HIGH (ref 70–99)
Glucose-Capillary: 133 mg/dL — ABNORMAL HIGH (ref 70–99)
Glucose-Capillary: 159 mg/dL — ABNORMAL HIGH (ref 70–99)
Glucose-Capillary: 171 mg/dL — ABNORMAL HIGH (ref 70–99)
Glucose-Capillary: 148 mg/dL — ABNORMAL HIGH (ref 70–99)
Glucose-Capillary: 166 mg/dL — ABNORMAL HIGH (ref 70–99)
Glucose-Capillary: 166 mg/dL — ABNORMAL HIGH (ref 70–99)
Glucose-Capillary: 165 mg/dL — ABNORMAL HIGH (ref 70–99)
Glucose-Capillary: 158 mg/dL — ABNORMAL HIGH (ref 70–99)
Glucose-Capillary: 227 mg/dL — ABNORMAL HIGH (ref 70–99)
Glucose-Capillary: 213 mg/dL — ABNORMAL HIGH (ref 70–99)

## 2018-05-27 LAB — PROTIME-INR
INR: 1.14
Prothrombin Time: 14.5 seconds (ref 11.4–15.2)

## 2018-05-27 LAB — SURGICAL PATHOLOGY

## 2018-05-27 LAB — C DIFFICILE QUICK SCREEN W PCR REFLEX
C DIFFICILE (CDIFF) INTERP: NOT DETECTED
C DIFFICILE (CDIFF) TOXIN: NEGATIVE
C DIFFICLE (CDIFF) ANTIGEN: NEGATIVE

## 2018-05-27 SURGERY — LOWER EXTREMITY ANGIOGRAPHY
Anesthesia: Moderate Sedation | Laterality: Right

## 2018-05-27 MED ORDER — FENTANYL CITRATE (PF) 100 MCG/2ML IJ SOLN
INTRAMUSCULAR | Status: DC | PRN
  Administered 2018-05-27: 25 ug via INTRAVENOUS

## 2018-05-27 MED ORDER — HEPARIN (PORCINE) IN NACL 1000-0.9 UT/500ML-% IV SOLN
INTRAVENOUS | Status: AC
  Filled 2018-05-27: qty 1000

## 2018-05-27 MED ORDER — MIDAZOLAM HCL 2 MG/2ML IJ SOLN
INTRAMUSCULAR | Status: DC | PRN
  Administered 2018-05-27: 0.5 mg via INTRAVENOUS

## 2018-05-27 MED ORDER — HEPARIN SODIUM (PORCINE) 1000 UNIT/ML IJ SOLN
INTRAMUSCULAR | Status: DC | PRN
  Administered 2018-05-27: 3000 [IU] via INTRAVENOUS

## 2018-05-27 MED ORDER — FENTANYL CITRATE (PF) 100 MCG/2ML IJ SOLN
INTRAMUSCULAR | Status: AC
  Filled 2018-05-27: qty 2

## 2018-05-27 MED ORDER — HEPARIN SODIUM (PORCINE) 1000 UNIT/ML IJ SOLN
INTRAMUSCULAR | Status: AC
  Filled 2018-05-27: qty 1

## 2018-05-27 MED ORDER — MIDAZOLAM HCL 5 MG/5ML IJ SOLN
INTRAMUSCULAR | Status: AC
  Filled 2018-05-27: qty 5

## 2018-05-27 MED ORDER — LABETALOL HCL 5 MG/ML IV SOLN: 10.0000 mg | INTRAVENOUS | Status: DC | PRN

## 2018-05-27 MED ORDER — SODIUM CHLORIDE 0.9% FLUSH
3.0000 mL | Freq: Two times a day (BID) | INTRAVENOUS | Status: DC
  Administered 2018-05-27 – 2018-05-30 (×6): 3 mL via INTRAVENOUS

## 2018-05-27 MED ORDER — SODIUM CHLORIDE 0.9 % IV SOLN
INTRAVENOUS | Status: AC
  Administered 2018-05-27: 19:00:00 via INTRAVENOUS

## 2018-05-27 MED ORDER — LIDOCAINE HCL (PF) 1 % IJ SOLN
INTRAMUSCULAR | Status: AC
  Filled 2018-05-27: qty 30

## 2018-05-27 MED ORDER — SODIUM CHLORIDE 0.9% FLUSH
3.0000 mL | INTRAVENOUS | Status: DC | PRN
  Administered 2018-05-28 – 2018-05-29 (×2): 3 mL via INTRAVENOUS
  Filled 2018-05-27 (×2): qty 3

## 2018-05-27 MED ORDER — SODIUM CHLORIDE 0.9 % IV SOLN: 250.0000 mL | INTRAVENOUS | Status: DC | PRN

## 2018-05-27 MED ORDER — HYDRALAZINE HCL 20 MG/ML IJ SOLN: 5.0000 mg | INTRAMUSCULAR | Status: DC | PRN

## 2018-05-27 SURGICAL SUPPLY — 24 items
BALLN LUTONIX DCB 4X40X130 (BALLOONS) ×3
BALLN LUTONIX DCB 4X60X130 (BALLOONS) ×3
BALLN LUTONIX DCB 6X40X130 (BALLOONS) ×3
BALLOON LUTONIX DCB 4X40X130 (BALLOONS) ×1 IMPLANT
BALLOON LUTONIX DCB 4X60X130 (BALLOONS) ×1 IMPLANT
BALLOON LUTONIX DCB 6X40X130 (BALLOONS) ×1 IMPLANT
CATH BEACON 5 .035 65 RIM TIP (CATHETERS) ×3 IMPLANT
CATH VERT 5FR 125CM (CATHETERS) ×3 IMPLANT
DEVICE PRESTO INFLATION (MISCELLANEOUS) ×6 IMPLANT
DEVICE STARCLOSE SE CLOSURE (Vascular Products) ×6 IMPLANT
DEVICE TORQUE .025-.038 (MISCELLANEOUS) ×3 IMPLANT
NEEDLE ENTRY 21GA 7CM ECHOTIP (NEEDLE) ×3 IMPLANT
PACK ANGIOGRAPHY (CUSTOM PROCEDURE TRAY) ×3 IMPLANT
SET INTRO CAPELLA COAXIAL (SET/KITS/TRAYS/PACK) ×3 IMPLANT
SHEATH ANL2 6FRX45 HC (SHEATH) ×3 IMPLANT
SHEATH BRITE TIP 5FRX11 (SHEATH) ×3 IMPLANT
SHEATH BRITE TIP 6FRX11 (SHEATH) ×6 IMPLANT
SHEATH BRITE TIP 7FRX11 (SHEATH) ×6 IMPLANT
STENT LIFESTAR 7X40X80 (Permanent Stent) ×3 IMPLANT
STENT LIFESTREAM 6X58X80 (Permanent Stent) ×6 IMPLANT
WIRE AQUATRACK .035X260CM (WIRE) ×3 IMPLANT
WIRE J 3MM .035X145CM (WIRE) ×3 IMPLANT
WIRE MAGIC TOR.035 180C (WIRE) ×3 IMPLANT
WIRE MAGIC TORQUE 260C (WIRE) ×3 IMPLANT

## 2018-05-27 NOTE — Progress Notes (Signed)
[facility] [place] [facility]   PATIENT NAME: April [name]    MR#:  [id number]  DATE OF BIRTH:  Dec 01, 1955  SUBJECTIVE:  CHIEF COMPLAINT:   Chief Complaint  Patient presents with  . Wound Check  Patient seen and evaluated today Currently on IV insulin drip for poorly controlled blood sugars Complains of pain in the lower extremities No complaints of any shortness of breath Completely weaned off oxygen  REVIEW OF SYSTEMS:  Review of Systems  Constitutional: Malaise, weakness HENT: Negative.   Eyes: Negative.   Respiratory: Shortness of breath improved, wheezing improved Cardiovascular: Palpitations Gastrointestinal: Negative.   Genitourinary: Negative.   Musculoskeletal: Negative.   Skin: Right foot wound Left foot pain Neurological: Negative.   Endo/Heme/Allergies: Negative.   Psychiatric/Behavioral: Negative.   All other systems reviewed and are negative.  DRUG ALLERGIES:   Allergies  Allergen Reactions  . Eggs Or Egg-Derived Products Diarrhea, Itching and Nausea And Vomiting    VITALS:  Blood pressure 106/62, pulse 79, temperature 97.7 F (36.5 C), temperature source Oral, resp. rate 14, height 5\' 2"  (1.575 m), weight 49.6 kg, SpO2 97 %.  PHYSICAL EXAMINATION:  Physical Exam  GENERAL:  62 y.o.-year-old patient lying in the bed with no acute distress.  Malnourished EYES: Pupils equal, round, reactive to light and accommodation. No scleral icterus. Extraocular muscles intact.  HEENT: Head atraumatic, normocephalic. Oropharynx and nasopharynx clear.  NECK:  Supple, no jugular venous distention. No thyroid enlargement, no tenderness.  LUNGS: Improved breath sounds bilaterally, bibasilar crepitations decreased Weaned off oxygen CARDIOVASCULAR: S1, S2 tachycardia noted. No murmurs, rubs, or gallops.  ABDOMEN: Soft, nontender, nondistended. Bowel sounds present. No organomegaly or mass.  EXTREMITIES: No pedal edema, cyanosis, or  clubbing.  Unable to palpate dorsalis pedis pulses. Ischemic ulcer left heel, also ulcer on the plantar surface of fifth metatarsal NEUROLOGIC: Cranial nerves II through XII are intact. Muscle strength 5/5 in all extremities. Sensation intact. Gait not checked.  PSYCHIATRIC: The patient is alert and oriented x 3.  SKIN: Diffuse macular rash noted on upper and lower extremities including the palms.  Denies any itching   LABORATORY PANEL:   CBC Recent Labs  Lab 05/27/18 0338  WBC 11.6*  HGB 8.1*  HCT 26.3*  PLT 293   ------------------------------------------------------------------------------------------------------------------  Chemistries  Recent Labs  Lab 05/22/18 2124  05/26/18 0520  05/27/18 0338  NA 132*   < > 131*   < > 136  K 5.3*   < > 5.2*   < > 3.9  CL 95*   < > 99   < > 106  CO2 32   < > 24   < > 26  GLUCOSE 513*   < > 659*   < > 120*  BUN 20   < > 57*   < > 42*  CREATININE 0.94   < > 1.24*   < > 1.20*  CALCIUM 8.3*   < > 7.6*   < > 7.4*  MG  --   --  2.1  --   --   AST 15  --   --   --   --   ALT 10  --   --   --   --   ALKPHOS 108  --   --   --   --   BILITOT 0.3  --   --   --   --    < > = values in this interval not displayed.   ------------------------------------------------------------------------------------------------------------------  Cardiac Enzymes No results for input(s): TROPONINI in the last 168 hours. ------------------------------------------------------------------------------------------------------------------  RADIOLOGY:  Dg Chest Port 1 View  Result Date: 05/26/2018 CLINICAL DATA:  Follow-up pneumonia EXAM: PORTABLE CHEST 1 VIEW COMPARISON:  05/24/2018 FINDINGS: Cardiac shadow is stable. Aortic calcifications are again seen. The lungs are well aerated bilaterally with persistent interstitial edema. Small right-sided pleural effusion is now noted. No focal infiltrate is seen. IMPRESSION: Persistent mild CHF with new right-sided  pleural effusion. Electronically Signed   By: Alcide CleverMark  Lukens M.D.   On: 05/26/2018 08:54    EKG:   Orders placed or performed during the hospital encounter of 06/05/16  . ED EKG  . ED EKG  . EKG    ASSESSMENT AND PLAN:   62 year old female with past medical history significant for peripheral vascular disease, ongoing smoking, COPD, diabetes presents to hospital secondary to nonhealing bilateral leg wounds.  1.  Status post hypoxic hypercapnic respiratory failure  Weaned of BiPAP and oxygen via nasal cannula Comfortable on room air  2. Acute COPD exacerbation improving Continue bronchodilatorinhaled steroids Tapering systemic steroids On empiric antibiotics  3.  Osteomyelitis right foot  & Bilateral lower extremity wounds  -Appreciate podiatry consult.   -Had I&D procedure in this hospitalization.  Incision and drainage of deep abscess and removal of sesamoid bones and first metatarsal head from the first metatarsal phalangeal joint region was done yesterday.  Excisional debridement of decubitus ulcer with necrotic eschar measuring 3.1 cm/3.2 cm with depth to the fatty tissue was done yesterday. -Continue IV antibiotics -IV  zosyn  4.  Diffuse macular rash on the extremities improving   5.  Anemia of chronic disease-stable.   Monitor hb and hct  6.  Peripheral arterial disease with ulceration and infection of the bilateral lower extremities Repeat angiogram in possible intervention by vascular surgery today  7.  DVT prophylaxis-Xarelto resumed  8.  Uncontrolled diabetes mellitus IV insulin drip for blood sugar control  All the records are reviewed and case discussed with Care Management/Social Workerr. Management plans discussed with the patient, family and they are in agreement.  CODE STATUS: Full code  TOTAL  TIME TAKING CARE OF THIS PATIENT: 35 minutes.   POSSIBLE D/C IN 3-4 DAYS, DEPENDING ON CLINICAL CONDITION.   Ihor AustinPavan Muslima Toppins M.D on 05/27/2018 at 11:29  AM  Between 7am to 6pm - Pager - [phone]  After 6pm go to [email or web address] - Social research officer, [email or web address] [email or web address]  [facility]  Office  [phone]  CC: Primary care physician; Mickel FuchsWroth, Thomas H, MD

## 2018-05-27 NOTE — Op Note (Signed)
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  Abdominal aortogram  2.  Bilateral distal runoff  3.  Percutaneous transluminal angioplasty and stent placement right common iliac artery; "kissing balloon" technique  4.  Percutaneous transluminal and plasty and stent placement left common iliac artery; "kissing balloon" technique  5.  Ultrasound guided access bilateral common femoral arteries  6.  Percutaneous transluminal angioplasty to 4 mm right superficial femoral artery             7.  Percutaneous transluminal angioplasty and stent placement left external iliac artery             8.  StarClose closure device bilateral common femoral arteries  Anesthesia: Conscious sedation was administered under my direct supervision by the interventional radiology RN. IV Versed plus fentanyl were utilized. Continuous ECG, pulse oximetry and blood pressure was monitored throughout the entire procedure. Conscious sedation was for a total of 75 minutes.  Sheath: The right SFA intervention was performed through a 6 [place] left common femoral artery retrograde approach the bilateral common iliac artery stents were performed through 7 French 11 cm Pinnacle sheaths bilateral common femoral artery placement  Contrast: 55 cc  Fluoroscopy Time: 5.7 minutes  Indications: Patient presented to [facility] with an infection of both lower extremities as well as bilateral ulcerations.  She is undergone surgical debridement.  She is now undergoing revascularization with the hope of preventing limb loss.  Risks and benefits of been reviewed all questions were answered patient is agreed to  proceed  Procedure:  April [name] a 62 y.o. female who was identified and appropriate procedural time out was performed.  The patient was then placed supine on the table and prepped and draped in the usual sterile fashion.  Ultrasound was used to evaluate the left common femoral artery.  It was echolucent and pulsatile indicating it is patent .  An ultrasound image was acquired for the permanent record.  A micropuncture needle was used to access the left common femoral artery under direct ultrasound guidance.  The microwire was then advanced under fluoroscopic guidance without difficulty followed by the micro-sheath  A 0.035 J wire was advanced without resistance and a 5Fr sheath was placed.    The rim catheter was then positioned at the level of the aortic bifurcation and hand-injection of contrast in the LAO projection was performed.  Ostial high-grade hemodynamically significant stenosis of the right common iliac was again noted the mid and distal right common iliac as well as the external iliac demonstrate diffuse disease but there are no hemodynamically significant lesions.  Aqua track wire was then advanced through the rim catheter and the rim catheter advanced down to the distal right external iliac artery.  RAO projection of the femoral bifurcation was then obtained with hand-injection.  Greater than 70% stenosis of the ostia of the SFA was noted.  Wire was then negotiated into the SFA rim catheter was removed and a 6 [place] was advanced up and over the bifurcation positioned in the proximal common femoral.  Hand-injection contrast was then used to complete the right lower extremity distal runoff.  This also showed a 70% stenosis in the mid SFA.  The distal SFA and popliteal are diffusely diseased but there are  no hemodynamically significant lesions.  There is three-vessel runoff at the trifurcation the posterior tibial artery remains widely patent filling the medial and lateral plantar  vessels and the pedal arch.  Peroneal is patent down to the ankle but does not demonstrate extensive collaterals crossing into the foot.  The anterior tibial is patent down to the level of the malleoli but then occludes.  Dorsalis pedis is poorly visualized.  4000 units of heparin was given and allowed to circulate for proximally 4 minutes.  Having negotiated the wire down to the popliteal a vertebral catheter is advanced and then the aqua track wire was exchanged for a Magic torque wire.  A 4 x 6 Lutonix drug-eluting balloon is then advanced across the mid SFA lesion inflation is to 12 atm for 2 minutes.  Follow-up imaging demonstrates less than 5% residual stenosis with an excellent result no indication for stent.  A 4 x 4 Lutonix is then advanced across the ostial lesion of the SFA.  Inflation is to 10 atm for 1 minute.  Follow-up imaging demonstrates wide patency of the ostia of the SFA no evidence of dissection less than 5% residual stenosis.  The [place] sheath is then pulled back into the left external iliac and the Magic torque wire negotiated into the thoracic aorta.  Ansell sheath is exchanged for [place] 11 cm 7 [place] Pinnacle sheath.  Hand-injection contrast and magnified image is then obtained of the aortic bifurcation which showed greater than 90% stenosis of the distal aorta associated with greater than 90% stenosis of the origins of both common iliac arteries.  After review the image the ultrasound was reprepped and delivered back onto the sterile field. The right common femoral was then imaged with the ultrasound it was noted to be echolucent and pulsatile indicating patency. Images recorded for the permanent record. Under real-time visualization a microneedle was inserted into the anterior wall the common femoral artery microwire was then advanced without difficulty under fluoroscopic guidance followed by placement of the micro-sheath.  A Magic torque wire was then negotiated under fluoroscopic  guidance into the aorta.  7 French sheath was then placed.  After appropriate sizing a 6 x 58 lifestream stent was selected for the right and a 6 x 58 lifestream stent was selected for the left. There were then advanced and positioned above the aortic bifurcation. Insufflation for full expansion of the stents was performed simultaneously.  Insufflation was to 12 atm for approximately 30 seconds.  Follow-up imaging was then performed and full expansion of the stents have been achieved with good apposition to the arterial wall and less than 5% residual stenosis throughout the distal aorta and proximal common iliac arteries.  This image also demonstrated a 70% stenosis was noted in the first 2 cm of the left external iliac.  A 7 mm x 40 mm life star stent was then advanced across this lesion deployed without difficulty and postdilated with a 6 mm x 40 mm Lutonix drug-eluting balloon inflated to 12 atm for 1 minute.  Follow-up imaging demonstrated less than 5% residual stenosis.  Oblique views were then obtained of the groins in succession and Star close device is deployed without difficulty. There were no immediate complications   Findings:   Aortogram:  The abdominal aorta is opacified with a bolus injection contrast. Demonstrates diffuse disease with greater than 90% stenosis of the distal aorta extending into the common iliac arteries bilaterally.  The right external iliac artery is widely patent the left external  iliac artery demonstrates a 70% stenosis in its proximal 2 cm.  Distally the artery is widely patent.             Right Lower Extremity: The right common femoral demonstrates moderate plaque formation the right profunda femoris demonstrates early bifurcation in one trunk there is a 70 to 80% stenosis the other is patent.  The ostia of the SFA demonstrates a greater than 70% stenosis and there is a midportion stenosis of greater than 70%.  Distally the trifurcation is patent as noted  above   Following placement of the iliac stents there is now wide patency with less than 5 % residual stenosis with rapid flow through the aortic bifurcation bilaterally.  The left external iliac artery is widely patent status post stenting with less than 5% residual stenosis.  The right SFA is widely patent with less than 5% residual stenosis status post angioplasty.  Distal runoff is preserved.   Summary:  Successful reconstruction of the distal aorta and bilateral iliac arteries and right lower extremity  Disposition: Patient was taken to the recovery room in stable condition having tolerated the procedure well.  Belenda Cruise Schnier 05/27/2018,6:02 PM

## 2018-05-27 NOTE — Progress Notes (Signed)
Name: Laylamarie J Croucher MRN: [id number] DOB: Feb 16, 1956     CONSULTATION DATE: 05/22/2018 Subjective & Objective: Insulin gtt.  PAST MEDICAL HISTORY :   has a past medical history of Anxiety, Colon polyps, COPD (chronic obstructive pulmonary disease) (HCC), Diabetes mellitus without complication (HCC), DVT (deep venous thrombosis) (HCC), DVT (deep venous thrombosis) (HCC), History of colonic polyps, Hyperlipidemia, Peripheral artery disease (HCC), Pulmonary nodules/lesions, multiple (08/2012), Retroperitoneal abscess (HCC), and Tobacco abuse.  has a past surgical history that includes Colonoscopy (05/2007); Cholecystectomy; Irrigation and debridement foot (Bilateral, 05/25/2018); and Lower Extremity Angiography (Left, 05/26/2018). Prior to Admission medications   Medication Sig Start Date End Date Taking? Authorizing Provider  aspirin 81 MG chewable tablet Chew 81 mg by mouth daily. 05/30/17  Yes [provider]  atorvastatin (LIPITOR) 40 MG tablet Take 40 mg by mouth daily. 10/24/17 10/24/18 Yes [provider]  Fluticasone-Salmeterol (ADVAIR DISKUS) 250-50 MCG/DOSE AEPB Inhale 1 puff into the lungs 2 (two) times daily. 03/14/15 01/16/26 Yes Beers, Charmayne Sheerharles M, PA-C  pantoprazole (PROTONIX) 40 MG tablet Take 40 mg by mouth 2 (two) times daily.  04/25/18  Yes [provider]  rivaroxaban (XARELTO) 10 MG TABS tablet Take 10 mg by mouth every evening.   Yes [provider]  SANTYL ointment Apply 1 application topically daily.  05/19/18  Yes [provider]  sodium hypochlorite (DAKIN'S 1/4 STRENGTH) 0.125 % SOLN Apply 1 application topically daily. Use as a wound cleanser, pat dry, then apply dressings 12/18/17  Yes [provider]  SPIRIVA HANDIHALER 18 MCG inhalation capsule Place 18 mcg into inhaler and inhale daily.  05/19/18  Yes [provider]  albuterol (PROVENTIL HFA;VENTOLIN HFA) 108 (90 Base) MCG/ACT inhaler Inhale 2 puffs into  the lungs every 6 (six) hours as needed for wheezing or shortness of breath.    [provider]  albuterol (PROVENTIL) (2.5 MG/3ML) 0.083% nebulizer solution Inhale 2.5 mg into the lungs every 6 (six) hours as needed for wheezing or shortness of breath.     [provider]  guaiFENesin-dextromethorphan (ROBITUSSIN DM) 100-10 MG/5ML syrup Take 5 mLs by mouth every 4 (four) hours as needed for cough. 04/11/18   Milagros LollSudini, Srikar, MD  oxyCODONE-acetaminophen (PERCOCET) 10-325 MG tablet Take 1 tablet by mouth every 6 (six) hours as needed for pain.     [provider]   Allergies  Allergen Reactions  . Eggs Or Egg-Derived Products Diarrhea, Itching and Nausea And Vomiting    FAMILY HISTORY:  family history includes Brain cancer in her mother. SOCIAL HISTORY:  reports that she has been smoking cigarettes. She has a 70.50 pack-year smoking history. She has never used smokeless tobacco. She reports that she does not drink alcohol or use drugs.  REVIEW OF SYSTEMS:   Unable to obtain due to critical illness   VITAL SIGNS: Temp:  [97.6 F (36.4 C)-98 F (36.7 C)] 98 F (36.7 C) (11/26 1300) Pulse Rate:  [75-92] 82 (11/26 1300) Resp:  [9-24] 14 (11/26 1300) BP: (92-118)/(50-87) 116/69 (11/26 1300) SpO2:  [92 %-100 %] 97 % (11/26 1300) Weight:  [49.6 kg] 49.6 kg (11/26 0500)   Physical Examination: awake, oriented, no focal motor deficits ToleratingRA/ [place], no distress, bilateral equal air entry with diffuse expiratory wheezes S1 & S2 are audible with no murmur Benign abdominal exam was normal peristalsis dressed right foot ulcer and left foot ulcer   ASSESSMENT / PLAN:  Acute respiratory failure with hypoxemia and hypercapnia(improved). Tolerating RA/ -MonitorO2 sat and work  of breathing  Acute exacerbation of COPD -Optimize bronchodilators + inhaled steroids + tapering systemic steroids + empiric antibiotics  Atelectasis and possible pneumonia  with pulmonary congestion. worsenig Right lower airspace disease -Zosyn + vancomycin. Monitor CXR + CBC + FiO2  AKI with intravascular volume depletion (improved) -Optimize volume, monitor renal panel and urine out put.  Right diabetic foot with diabetic ulcer. No osteomyelitis on MRI.BLD culture 05/22/2018 remains -ve  On 05/26/2018 patient underwent percutaneous transluminal angioplasty of left SFA and popliteal arteries.Cult GPC in pairs -Zosyn + vancomycin.  Follow with wound culture -Management as per vascular surgery and podiatry  Diabetes mellitus -Optimize glycemic control -Optimize Lantus and taper Insulin gtt.  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  -Xarelto was resumed and off Heparin gtt.  Anemia -Keep hemoglobin more than 7 g/dL  UTI.  MRSA -ABX as above and follows culture  Full code  DVT + GI prophylaxis. Continue supportive care  Critical care time 40 minutes

## 2018-05-27 NOTE — Progress Notes (Signed)
Patient transported via bed with RN and orderly to specials procedure.

## 2018-05-27 NOTE — Progress Notes (Signed)
Telephone report given to Aurther Lofterry, RN in specials procedure.

## 2018-05-27 NOTE — Progress Notes (Signed)
Dr. Orland Jarredroxler at bedside and changed dressing to right foot and removed wound vac from left foot to assess wound.  Verbal order given to place wound nurse consult for wound vac to be placed back to left foot wound.

## 2018-05-27 NOTE — Progress Notes (Signed)
[facility] Podiatry                                                      Patient Demographics  April [name], is a 62 y.o. female   MRN: [id number]   DOB - 11-21-1955  Admit Date - 05/22/2018    Outpatient Primary MD for the patient is Wroth, Sydnee Cabal, MD  Consult requested in the Hospital by Ihor Austin, MD, On 05/27/2018    With History of -  Past Medical History:  Diagnosis Date  . Anxiety   . Colon polyps   . COPD (chronic obstructive pulmonary disease) (HCC)   . Diabetes mellitus without complication (HCC)   . DVT (deep venous thrombosis) (HCC)   . DVT (deep venous thrombosis) (HCC)   . History of colonic polyps   . Hyperlipidemia   . Peripheral artery disease (HCC)   . Pulmonary nodules/lesions, multiple 08/2012   NEGATIVE PET SCAN  . Retroperitoneal abscess (HCC)    history of in 04/2013  . Tobacco abuse       Past Surgical History:  Procedure Laterality Date  . CHOLECYSTECTOMY    . COLONOSCOPY  05/2007  . IRRIGATION AND DEBRIDEMENT FOOT Bilateral 05/25/2018   Procedure: IRRIGATION AND DEBRIDEMENT FOOT application wound vac left  foot;  Surgeon: Recardo Evangelist, DPM;  Location: [facility] ORS;  Service: Podiatry;  Laterality: Bilateral;  . LOWER EXTREMITY ANGIOGRAPHY Left 05/26/2018   Procedure: Lower Extremity Angiography with possible intervention;  Surgeon: Annice Needy, MD;  Location: [facility] INVASIVE CV LAB;  Service: Cardiovascular;  Laterality: Left;    in for   Chief Complaint  Patient presents with  . Wound Check     HPI  April [name]  is a 62 y.o. female, patient is 2 days status post I&D infection to the right foot with removal of the metatarsal head.  Also excisional debridement and wound VAC placement to the left heel decubitus.  She had angioplasty done yesterday to restore some better circulation to her left foot.  She is  scheduled to have her right foot done today.    Review of Systems    In addition to the HPI above,  No Fever-chills, No Headache, No changes with Vision or hearing, No problems swallowing food or Liquids, No Chest pain, Cough or Shortness of Breath, No Abdominal pain, No Nausea or Vommitting, Bowel movements are regular, No Blood in stool or Urine, No dysuria, No new skin rashes or bruises, No new joints pains-aches,  No new weakness, tingling, numbness in any extremity, No recent weight gain or loss, No polyuria, polydypsia or polyphagia, No significant Mental Stressors.  A full 10 point Review of Systems was done, except as stated above, all other Review of Systems were negative.   Social History Social History   Tobacco Use  . Smoking status: Current Every Day Smoker    Packs/day: 1.50    Years: 47.00    Pack years: 70.50    Types: Cigarettes  . Smokeless tobacco: Never Used  . Tobacco comment: Smoking History 2.5-started at age 27  Substance Use Topics  . Alcohol use: No    Alcohol/week: 0.0 standard drinks    Family History Family History  Problem Relation Age of Onset  . Brain cancer Mother     Prior to Admission medications  Medication Sig Start Date End Date Taking? Authorizing Provider  aspirin 81 MG chewable tablet Chew 81 mg by mouth daily. 05/30/17  Yes [provider]  atorvastatin (LIPITOR) 40 MG tablet Take 40 mg by mouth daily. 10/24/17 10/24/18 Yes [provider]  Fluticasone-Salmeterol (ADVAIR DISKUS) 250-50 MCG/DOSE AEPB Inhale 1 puff into the lungs 2 (two) times daily. 03/14/15 01/16/26 Yes Beers, Charmayne Sheerharles M, PA-C  pantoprazole (PROTONIX) 40 MG tablet Take 40 mg by mouth 2 (two) times daily.  04/25/18  Yes [provider]  rivaroxaban (XARELTO) 10 MG TABS tablet Take 10 mg by mouth every evening.   Yes [provider]  SANTYL ointment Apply 1 application topically daily.  05/19/18  Yes [provider]   sodium hypochlorite (DAKIN'S 1/4 STRENGTH) 0.125 % SOLN Apply 1 application topically daily. Use as a wound cleanser, pat dry, then apply dressings 12/18/17  Yes [provider]  SPIRIVA HANDIHALER 18 MCG inhalation capsule Place 18 mcg into inhaler and inhale daily.  05/19/18  Yes [provider]  albuterol (PROVENTIL HFA;VENTOLIN HFA) 108 (90 Base) MCG/ACT inhaler Inhale 2 puffs into the lungs every 6 (six) hours as needed for wheezing or shortness of breath.    [provider]  albuterol (PROVENTIL) (2.5 MG/3ML) 0.083% nebulizer solution Inhale 2.5 mg into the lungs every 6 (six) hours as needed for wheezing or shortness of breath.     [provider]  guaiFENesin-dextromethorphan (ROBITUSSIN DM) 100-10 MG/5ML syrup Take 5 mLs by mouth every 4 (four) hours as needed for cough. 04/11/18   Milagros LollSudini, Srikar, MD  oxyCODONE-acetaminophen (PERCOCET) 10-325 MG tablet Take 1 tablet by mouth every 6 (six) hours as needed for pain.     [provider]    Anti-infectives (From admission, onward)   Start     Dose/Rate Route Frequency Ordered Stop   05/26/18 2300  vancomycin (VANCOCIN) IVPB 1000 mg/200 mL premix  Status:  Discontinued     1,000 mg 200 mL/hr over 60 Minutes Intravenous Every 24 hours 05/25/18 2357 05/26/18 0008   05/26/18 0819  ceFAZolin (ANCEF) IVPB 2g/100 mL premix  Status:  Discontinued     2 g 200 mL/hr over 30 Minutes Intravenous 30 min pre-op 05/26/18 0819 05/26/18 1654   05/26/18 0008  vancomycin (VANCOCIN) IVPB 1000 mg/200 mL premix     1,000 mg 200 mL/hr over 60 Minutes Intravenous Every 24 hours 05/26/18 0008     05/26/18 0000  vancomycin (VANCOCIN) 500 mg in sodium chloride 0.9 % 100 mL IVPB  Status:  Discontinued     500 mg 100 mL/hr over 60 Minutes Intravenous  Once 05/25/18 2357 05/26/18 0008   05/23/18 2300  vancomycin (VANCOCIN) 500 mg in sodium chloride 0.9 % 100 mL IVPB  Status:  Discontinued     500 mg 100 mL/hr over 60  Minutes Intravenous Every 24 hours 05/23/18 0555 05/25/18 2357   05/23/18 0600  piperacillin-tazobactam (ZOSYN) IVPB 3.375 g  Status:  Discontinued     3.375 g 100 mL/hr over 30 Minutes Intravenous Every 6 hours 05/23/18 0253 05/23/18 0301   05/23/18 0315  piperacillin-tazobactam (ZOSYN) IVPB 3.375 g     3.375 g 12.5 mL/hr over 240 Minutes Intravenous Every 8 hours 05/23/18 0301     05/22/18 2330  vancomycin (VANCOCIN) IVPB 1000 mg/200 mL premix     1,000 mg 200 mL/hr over 60 Minutes Intravenous  Once 05/22/18 2329 05/23/18 0037   05/22/18 2330  cefTRIAXone (ROCEPHIN) 2 g in sodium  chloride 0.9 % 100 mL IVPB  Status:  Discontinued     2 g 200 mL/hr over 30 Minutes Intravenous Every 24 hours 05/22/18 2329 05/23/18 0301      Scheduled Meds: . aspirin  81 mg Oral Daily  . atorvastatin  40 mg Oral Daily  . docusate sodium  100 mg Oral BID  . fluticasone furoate-vilanterol  1 puff Inhalation Daily  . insulin regular  0-10 Units Intravenous TID WC  . methylPREDNISolone (SOLU-MEDROL) injection  40 mg Intravenous Q12H  . multivitamin-lutein  1 capsule Oral Daily  . pantoprazole  40 mg Oral BID  . protein supplement shake  11 oz Oral BID BM  . rivaroxaban  10 mg Oral Q supper  . tiotropium  18 mcg Inhalation Daily  . vitamin C  500 mg Oral BID   Continuous Infusions: . sodium chloride Stopped (05/26/18 0532)  . sodium chloride 5 mL/hr at 05/26/18 0630  . sodium chloride    . dextrose 5 % and 0.45% NaCl 100 mL/hr at 05/27/18 1300  . insulin Stopped (05/27/18 1339)  . piperacillin-tazobactam (ZOSYN)  IV 3.375 g (05/27/18 1332)  . vancomycin Stopped (05/27/18 0021)   PRN Meds:.sodium chloride, acetaminophen **OR** acetaminophen, albuterol, bisacodyl, dextrose, guaiFENesin-dextromethorphan, HYDROcodone-acetaminophen, HYDROmorphone (DILAUDID) injection, ondansetron **OR** ondansetron (ZOFRAN) IV, ondansetron (ZOFRAN) IV, oxyCODONE-acetaminophen **AND** oxyCODONE, traZODone  Allergies   Allergen Reactions  . Eggs Or Egg-Derived Products Diarrhea, Itching and Nausea And Vomiting    Physical Exam  Vitals  Blood pressure 116/69, pulse 82, temperature 98 F (36.7 C), temperature source Oral, resp. rate 14, height 5\' 2"  (1.575 m), weight 49.6 kg, SpO2 97 %.  Lower Extremity exam: Right foot dressing was taken down patient's infection looks much better.  There is really no cellulitis to the area at this timeframe incision margin looks relatively stable and I think the vascular procedure she is scheduled to have today should help her out quite a bit.  Her white count is down considerably also. Left heel dressing was also changed to remove the wound VAC the ulcer on the heel is still questionable and may need further debridement later this week as she establishes hopefully better blood flow with the procedure done yesterday.  I will have the wound VAC replaced on there today by wound care team.  I will transfer care to Dr. Ether Griffins from out of town. Data Review  CBC Recent Labs  Lab 05/22/18 2124 05/23/18 0426 05/24/18 0451 05/25/18 0323 05/26/18 0520 05/27/18 0338  WBC 15.6* 12.4* 15.8* 10.9* 12.6* 11.6*  HGB 11.1* 9.3* 9.3* 8.9* 8.3* 8.1*  HCT 34.7* 29.5* 30.3* 28.2* 26.0* 26.3*  PLT 351 305 315 310 292 293  MCV 94.6 95.8 98.4 94.9 96.7 96.3  MCH 30.2 30.2 30.2 30.0 30.9 29.7  MCHC 32.0 31.5 30.7 31.6 31.9 30.8  RDW 12.4 12.4 12.6 12.5 12.7 12.5  LYMPHSABS 1.7  --   --  1.0 0.7  --   MONOABS 0.6  --   --  0.1 0.3  --   EOSABS 0.1  --   --  0.0 0.0  --   BASOSABS 0.1  --   --  0.0 0.0  --    ------------------------------------------------------------------------------------------------------------------  Chemistries  Recent Labs  Lab 05/22/18 2124  05/24/18 0451 05/25/18 0323 05/26/18 0520 05/26/18 2037 05/27/18 0338  NA 132*   < > 136 136 131* 137 136  K 5.3*   < > 4.9 4.4 5.2* 4.3 3.9  CL 95*   < >  102 101 99 103 106  CO2 32   < > 26 27 24 23 26    GLUCOSE 513*   < > 132* 305* 659* 237* 120*  BUN 20   < > 27* 45* 57* 47* 42*  CREATININE 0.94   < > 1.00 1.19* 1.24* 1.40* 1.20*  CALCIUM 8.3*   < > 8.0* 8.2* 7.6* 7.5* 7.4*  MG  --   --   --   --  2.1  --   --   AST 15  --   --   --   --   --   --   ALT 10  --   --   --   --   --   --   ALKPHOS 108  --   --   --   --   --   --   BILITOT 0.3  --   --   --   --   --   --    < > = values in this interval not displayed.   Assessment & Plan: Improving right foot but will help with vascular procedure to improve blood flow to the region.  Infections under better control.  Left heel ulcer still has uncertainty as to his possibility of progressing healing but I will reestablish the wound VAC will likely need further debridement later this week.  Active Problems:   Acute osteomyelitis of right foot (HCC)   Acute respiratory failure with hypoxia and hypercapnia (HCC)   Family Communication: Plan discussed with patient   Recardo Evangelist M.D on 05/27/2018 at 1:49 PM  Thank you for the consult, we will follow the patient with you in the Hospital.

## 2018-05-27 NOTE — Progress Notes (Signed)
Inpatient Diabetes Program Recommendations  AACE/ADA: New Consensus Statement on Inpatient Glycemic Control (2019)  Target Ranges:  Prepandial:   less than 140 mg/dL      Peak postprandial:   less than 180 mg/dL (1-2 hours)      Critically ill patients:  140 - 180 mg/dL   Results for April [name], April [name] (MRN [id number]) as of 05/27/2018 07:30  Ref. Range 05/26/2018 23:57 05/27/2018 01:02 05/27/2018 01:58 05/27/2018 03:01 05/27/2018 04:00 05/27/2018 04:57 05/27/2018 05:58 05/27/2018 06:53  Glucose-Capillary Latest Ref Range: 70 - 99 mg/dL 409225 (H) 811206 (H) 914173 (H) 141 (H) 102 (H) 89 114 (H) 117 (H)  Results for April [name], April [name] (MRN [id number]) as of 05/27/2018 07:30  Ref. Range 04/09/2018 00:52 05/25/2018 12:53  Hemoglobin A1C Latest Ref Range: 4.8 - 5.6 % 10.3 (H) 10.9 (H)   Review of Glycemic Control Diabetes history:DM2 Outpatient Diabetes medications: Levemir 10 units QHS, Novolog 7 units TID with meals, Glipizide 5 mg daily Current orders for Inpatient glycemic control:IV insulin drip; Solumedrol 40 mg Q12H  Inpatient Diabetes Program Recommendations: Insulin - Basal: At time of transition from IV to SQ insulin, please consider ordering Levemir 15 units Q24H.  Insulin -Correction: At time of transition from IV to SQ insulin, please consider ordering CBGs with Novolog 0-9 units Q4H.  Insulin-Meal Coverage: Once patient is transitioned to SQ insulin and diet is resumed, please consider ordering Novolog 3 units TID with meals for meal coverage if patient eats at least 50% of meals.  HgbA1C: A1C 10.9% on 05/25/18 indicating an average glucose of 266 mg/dlover the past 2-3 months. Diabetes Coordinator spoke with patient at length on 04/10/18 during last hospital admission and patient reported she was NOT taking insulin at home. Anticipate patient is still not taking DM medications at home as A1C has increased from 10.3 to 10.9%. Patientwill need to take DM medications as prescribed, check  glucose at home consistently, and follow up with PCP regarding DM control.  Thanks, Orlando PennerMarie Nina Mondor, RN, MSN, CDE Diabetes Coordinator Inpatient Diabetes Program [phone] (Team Pager from 8am to 5pm)

## 2018-05-28 ENCOUNTER — Inpatient Hospital Stay: Payer: Medicare HMO

## 2018-05-28 ENCOUNTER — Encounter: Payer: Self-pay | Admitting: Vascular Surgery

## 2018-05-28 LAB — CBC
HCT: 27.4 % — ABNORMAL LOW (ref 36.0–46.0)
Hemoglobin: 8.8 g/dL — ABNORMAL LOW (ref 12.0–15.0)
MCH: 30.6 pg (ref 26.0–34.0)
MCHC: 32.1 g/dL (ref 30.0–36.0)
MCV: 95.1 fL (ref 80.0–100.0)
PLATELETS: 305 10*3/uL (ref 150–400)
RBC: 2.88 MIL/uL — AB (ref 3.87–5.11)
RDW: 12.9 % (ref 11.5–15.5)
WBC: 13.5 10*3/uL — AB (ref 4.0–10.5)
nRBC: 0 % (ref 0.0–0.2)

## 2018-05-28 LAB — BASIC METABOLIC PANEL
Anion gap: 5 (ref 5–15)
BUN: 26 mg/dL — ABNORMAL HIGH (ref 8–23)
CALCIUM: 7.5 mg/dL — AB (ref 8.9–10.3)
CO2: 27 mmol/L (ref 22–32)
CREATININE: 0.77 mg/dL (ref 0.44–1.00)
Chloride: 108 mmol/L (ref 98–111)
GFR calc Af Amer: >60 mL/min (ref >60)
Glucose, Bld: 149 mg/dL — ABNORMAL HIGH (ref 70–99)
Potassium: 3.9 mmol/L (ref 3.5–5.1)
SODIUM: 140 mmol/L (ref 135–145)

## 2018-05-28 LAB — GLUCOSE, CAPILLARY
GLUCOSE-CAPILLARY: 187 mg/dL — AB (ref 70–99)
GLUCOSE-CAPILLARY: 168 mg/dL — AB (ref 70–99)
GLUCOSE-CAPILLARY: 123 mg/dL — AB (ref 70–99)
GLUCOSE-CAPILLARY: 115 mg/dL — AB (ref 70–99)
GLUCOSE-CAPILLARY: 188 mg/dL — AB (ref 70–99)
Glucose-Capillary: 112 mg/dL — ABNORMAL HIGH (ref 70–99)
Glucose-Capillary: 126 mg/dL — ABNORMAL HIGH (ref 70–99)
Glucose-Capillary: 127 mg/dL — ABNORMAL HIGH (ref 70–99)
Glucose-Capillary: 156 mg/dL — ABNORMAL HIGH (ref 70–99)
Glucose-Capillary: 159 mg/dL — ABNORMAL HIGH (ref 70–99)
Glucose-Capillary: 166 mg/dL — ABNORMAL HIGH (ref 70–99)
Glucose-Capillary: 224 mg/dL — ABNORMAL HIGH (ref 70–99)
Glucose-Capillary: 225 mg/dL — ABNORMAL HIGH (ref 70–99)
Glucose-Capillary: 228 mg/dL — ABNORMAL HIGH (ref 70–99)

## 2018-05-28 LAB — CULTURE, BLOOD (ROUTINE X 2)
CULTURE: NO GROWTH
Culture: NO GROWTH
Special Requests: ADEQUATE

## 2018-05-28 LAB — PROTIME-INR
INR: 1.16
PROTHROMBIN TIME: 14.7 s (ref 11.4–15.2)

## 2018-05-28 MED ORDER — INSULIN DETEMIR 100 UNIT/ML ~~LOC~~ SOLN
15.0000 [IU] | Freq: Every day | SUBCUTANEOUS | Status: DC
  Administered 2018-05-28 – 2018-05-31 (×4): 15 [IU] via SUBCUTANEOUS
  Filled 2018-05-28 (×7): qty 0.15

## 2018-05-28 MED ORDER — DM-GUAIFENESIN ER 30-600 MG PO TB12: 2.0000 | ORAL_TABLET | Freq: Two times a day (BID) | ORAL | Status: DC

## 2018-05-28 MED ORDER — COLLAGENASE 250 UNIT/GM EX OINT
TOPICAL_OINTMENT | Freq: Every day | CUTANEOUS | Status: DC
  Administered 2018-05-28 – 2018-06-01 (×5): via TOPICAL
  Filled 2018-05-28 (×2): qty 30

## 2018-05-28 MED ORDER — INSULIN ASPART 100 UNIT/ML ~~LOC~~ SOLN
3.0000 [IU] | Freq: Three times a day (TID) | SUBCUTANEOUS | Status: DC
  Administered 2018-05-28 – 2018-06-01 (×9): 3 [IU] via SUBCUTANEOUS
  Filled 2018-05-28 (×8): qty 1

## 2018-05-28 MED ORDER — INSULIN ASPART 100 UNIT/ML ~~LOC~~ SOLN
0.0000 [IU] | Freq: Three times a day (TID) | SUBCUTANEOUS | Status: DC
  Administered 2018-05-28 – 2018-05-29 (×3): 2 [IU] via SUBCUTANEOUS
  Administered 2018-05-30: 3 [IU] via SUBCUTANEOUS
  Administered 2018-05-30: 2 [IU] via SUBCUTANEOUS
  Administered 2018-05-31 (×2): 3 [IU] via SUBCUTANEOUS
  Administered 2018-05-31: 2 [IU] via SUBCUTANEOUS
  Administered 2018-06-01: 3 [IU] via SUBCUTANEOUS
  Filled 2018-05-28 (×8): qty 1

## 2018-05-28 MED ORDER — INSULIN ASPART 100 UNIT/ML ~~LOC~~ SOLN: 0.0000 [IU] | Freq: Every day | SUBCUTANEOUS | Status: DC

## 2018-05-28 NOTE — Progress Notes (Signed)
F/u b/l foot issues.  Right foot s/p I & D.   This is doing well.  Minimal to no erythema.  No drainage.  Fairly well coapted.  Left heel ulcer.  Heel has large necrotic eschar.  No drainage.  Dry.  No surrouding erythema.  Will d/c wound vac as heel has dry eschar.  Start Santyl and pressure reducing boots.  Very difficult wound with high likelyhood of amputation.  No indication for debridement right now.

## 2018-05-28 NOTE — Progress Notes (Signed)
[employer] [facility] [place] [facility]   PATIENT NAME: April [name]    MR#:  [id number]  DATE OF BIRTH:  04-03-1956  SUBJECTIVE:  CHIEF COMPLAINT:   Chief Complaint  Patient presents with  . Wound Check  Patient seen and evaluated today  on IV insulin drip for poorly controlled blood sugars Pain in the lower extremities better No complaints of any shortness of breath Completely weaned off oxygen  REVIEW OF SYSTEMS:  Review of Systems  Constitutional: Malaise, weakness HENT: Negative.   Eyes: Negative.   Respiratory: Shortness of breath improved, wheezing improved Cardiovascular: Palpitations Gastrointestinal: Negative.   Genitourinary: Negative.   Musculoskeletal: Negative.   Skin: Right foot wound Left foot pain better Neurological: Negative.   Endo/Heme/Allergies: Negative.   Psychiatric/Behavioral: Negative.   All other systems reviewed and are negative.  DRUG ALLERGIES:   Allergies  Allergen Reactions  . Eggs Or Egg-Derived Products Diarrhea, Itching and Nausea And Vomiting    VITALS:  Blood pressure 131/81, pulse 98, temperature (!) 97.5 F (36.4 C), temperature source Oral, resp. rate 16, height 5\' 2"  (1.575 m), weight 50.2 kg, SpO2 100 %.  PHYSICAL EXAMINATION:  Physical Exam  GENERAL:  62 y.o.-year-old patient lying in the bed with no acute distress.  Malnourished EYES: Pupils equal, round, reactive to light and accommodation. No scleral icterus. Extraocular muscles intact.  HEENT: Head atraumatic, normocephalic. Oropharynx and nasopharynx clear.  NECK:  Supple, no jugular venous distention. No thyroid enlargement, no tenderness.  LUNGS: Improved breath sounds bilaterally, bibasilar crepitations decreased Weaned off oxygen CARDIOVASCULAR: S1, S2 tachycardia noted. No murmurs, rubs, or gallops.  ABDOMEN: Soft, nontender, nondistended. Bowel sounds present. No organomegaly or mass.  EXTREMITIES: No pedal edema, cyanosis, or clubbing.   Unable to palpate dorsalis pedis pulses. Ischemic ulcer left heel, also ulcer on the plantar surface of fifth metatarsal NEUROLOGIC: Cranial nerves II through XII are intact. Muscle strength 5/5 in all extremities. Sensation intact. Gait not checked.  PSYCHIATRIC: The patient is alert and oriented x 3.  SKIN: Diffuse macular rash noted on upper and lower extremities including the palms.  Denies any itching   LABORATORY PANEL:   CBC Recent Labs  Lab 05/28/18 0422  WBC 13.5*  HGB 8.8*  HCT 27.4*  PLT 305   ------------------------------------------------------------------------------------------------------------------  Chemistries  Recent Labs  Lab 05/22/18 2124  05/26/18 0520  05/28/18 0422  NA 132*   < > 131*   < > 140  K 5.3*   < > 5.2*   < > 3.9  CL 95*   < > 99   < > 108  CO2 32   < > 24   < > 27  GLUCOSE 513*   < > 659*   < > 149*  BUN 20   < > 57*   < > 26*  CREATININE 0.94   < > 1.24*   < > 0.77  CALCIUM 8.3*   < > 7.6*   < > 7.5*  MG  --   --  2.1  --   --   AST 15  --   --   --   --   ALT 10  --   --   --   --   ALKPHOS 108  --   --   --   --   BILITOT 0.3  --   --   --   --    < > = values in this interval not  displayed.   ------------------------------------------------------------------------------------------------------------------  Cardiac Enzymes No results for input(s): TROPONINI in the last 168 hours. ------------------------------------------------------------------------------------------------------------------  RADIOLOGY:  Dg Chest Port 1 View  Result Date: 05/28/2018 CLINICAL DATA:  Follow-up atelectatic changes EXAM: PORTABLE CHEST 1 VIEW COMPARISON:  05/26/2018 FINDINGS: Cardiac shadow is stable. Aortic calcifications are again noted. Bilateral pleural effusions are seen right greater than left. Mild vascular congestion remains with some interstitial edema. No focal confluent infiltrate is noted. IMPRESSION: Bilateral pleural effusions right  greater than left. Mild vascular congestion is noted. Electronically Signed   By: Alcide CleverMark  Lukens M.D.   On: 05/28/2018 07:22    EKG:   Orders placed or performed during the hospital encounter of 06/05/16  . ED EKG  . ED EKG  . EKG    ASSESSMENT AND PLAN:   62 year old female with past medical history significant for peripheral vascular disease, ongoing smoking, COPD, diabetes presents to hospital secondary to nonhealing bilateral leg wounds.  1.  Status post hypoxic hypercapnic respiratory failure  Weaned of BiPAP and oxygen via nasal cannula Comfortable on room air  2. Acute COPD exacerbation improved Continue bronchodilatorinhaled steroids Tapering systemic steroids On empiric antibiotics  3.  Osteomyelitis right foot  & Bilateral lower extremity wounds secondary to poor circulation -Appreciate podiatry consult.   -Had I&D procedure in this hospitalization.  Incision and drainage of deep abscess and removal of sesamoid bones and first metatarsal head from the first metatarsal phalangeal joint region was done. Excisional debridement of decubitus ulcer with necrotic eschar measuring 3.1 cm/3.2 cm with depth to the fatty tissue was done by podiatric -Continue IV antibiotics -IV  zosyn  4.  Diffuse macular rash on the extremities improved  5.  Anemia of chronic disease-stable.   Monitor hb and hct  6.  Peripheral arterial disease with ulceration and infection of the bilateral lower extremities Status post bilateral femoral artery stent placements by vascular surgery yesterday  7.  DVT prophylaxis-Xarelto resumed  8.  Uncontrolled diabetes mellitus IV insulin drip for blood sugar control Wean insulin drip as blood sugars become better controlled  All the records are reviewed and case discussed with Care Management/Social Workerr. Management plans discussed with the patient, family and they are in agreement.  CODE STATUS: Full code  TOTAL  TIME TAKING CARE OF THIS PATIENT:  33 minutes.   POSSIBLE D/C IN 3-4 DAYS, DEPENDING ON CLINICAL CONDITION.   Ihor AustinPavan  M.D on 05/28/2018 at 11:41 AM  Between 7am to 6pm - Pager - [phone]  After 6pm go to [email or web address] - Social research officer, governmentpassword [email or web address] [email or web address]  [facility] [place] [facility]  Office  [phone]  CC: Primary care physician; Mickel FuchsWroth, Thomas H, MD

## 2018-05-28 NOTE — Consult Note (Signed)
WOC Nurse wound consult note Reason for Consult:Application of NPWT (VAC) therapy to left foot. Recent debridement to left heel. Podiatry is following right foot and recent amputation right metatarsal head.   Wound type: Infectious pressure injury, recent debridement.  Pressure Injury POA: Yes unstageable Measurement: 4 cm x 3.2 cm unable to visualize due to thin layer eschar present.  Tender to touch Wound UYQ:IHKVQQbed:eschar Drainage (amount, consistency, odor) minimal serosanguinous no odor Periwound:intact Dressing procedure/placement/frequency:Bridged dressing up back of leg to offload pressure to heel. 2 pieces black foam with bridge segment.  Seal immediately achieved at 125 mmHg. Change M/W/F WOC team will follow.  April HudsonKaren Amery Vandenbos MSN, RN, FNP-BC CWON Wound, Ostomy, Continence Nurse Pager [phone]

## 2018-05-28 NOTE — Progress Notes (Signed)
Nutrition Follow-up  DOCUMENTATION CODES:   Severe malnutrition in context of chronic illness  INTERVENTION:  Continue Premier Protein po BID, each supplement provides 160 kcal and 30 grams of protein.  Continue Ocuvite daily for wound healing (provides zinc, vitamin A, vitamin C, Vitamin E, copper, and selenium).  Continue vitamin C 500 mg BID.  NUTRITION DIAGNOSIS:   Severe Malnutrition related to chronic illness(COPD, uncontrolled DM) as evidenced by percent weight loss, severe muscle depletion, severe fat depletion.  Ongoing.  GOAL:   Patient will meet greater than or equal to 90% of their needs  Progressing.  MONITOR:   PO intake, Supplement acceptance, Labs, Weight trends, Skin, I & O's  REASON FOR ASSESSMENT:   Other (Comment)(low BMI)    ASSESSMENT:   62 y.o. female with a known history of peripheral vascular disease, tobacco abuse, COPD, diabetes type 2 and nonhealing wounds on her feet.   -Patient transferred to stepdown on 11/23 after needing to be placed on BiPAP. -Underwent I&D of deep abscess on right foot and removal of sesamoid bones and first metatarsal head from phalangeal joint region, plus I&D of decubitus ulcer to left heel on 11/24. -Underwent percutaneous transluminal angioplasty of left SFA and popliteal arteries on 11/25. -Underwent reconstruction of distal aorta and bilateral iliac arteries and right lower extremity on 11/26.  Patient has been made NPO frequently over the past few days due to multiple procedures. She is currently back on carbohydrate modified diet. Requiring insulin gtt but patient is transitioning off today. The past 3 days patient has been NPO when Premier Protein was due, but she was able to receive a bottle of Premier today.  Medications reviewed and include: Colace 100 mg BID, Novolog 0-9 units TID, Novolog 0-5 units QHS, Novolog 3 units TID with meals, Levemir 15 units daily, Ocuvite daily, pantoprazole, Xarelto, vitamin C  500 mg BID, D5-1/2NS at 50 mL/hr, vancomycin.  Labs reviewed: CBG 112-228, BUN 26.  Discussed with RN and on rounds.  Diet Order:   Diet Order            Diet Carb Modified Fluid consistency: Thin; Room service appropriate? Yes  Diet effective now             EDUCATION NEEDS:   Education needs have been addressed  Skin:  Skin Assessment: Reviewed RN Assessment(non healing wounds on bilateral feet )  Last BM:  05/28/2018 - small type 6  Height:   Ht Readings from Last 1 Encounters:  05/24/18 5\' 2"  (1.575 m)   Weight:   Wt Readings from Last 1 Encounters:  05/28/18 50.2 kg   Ideal Body Weight:  50 kg  BMI:  Body mass index is 20.24 kg/m.  Estimated Nutritional Needs:   Kcal:  1200-1400kcal/day   Protein:  55-63g/day   Fluid:  1L/day   Helane RimaLeanne Yaris Ferrell, MS, RD, LDN Office: [phone] Pager: [phone] After Hours/Weekend Pager: [phone]

## 2018-05-28 NOTE — Progress Notes (Signed)
Name: April KillingsDoris J Beidler MRN: [id number] DOB: [birth year]     CONSULTATION DATE: 05/22/2018  Subjective & Objectives: Insulin gtt. And no major issues last night  PAST MEDICAL HISTORY :   has a past medical history of Anxiety, Colon polyps, COPD (chronic obstructive pulmonary disease) (HCC), Diabetes mellitus without complication (HCC), DVT (deep venous thrombosis) (HCC), DVT (deep venous thrombosis) (HCC), History of colonic polyps, Hyperlipidemia, Peripheral artery disease (HCC), Pulmonary nodules/lesions, multiple (08/2012), Retroperitoneal abscess (HCC), and Tobacco abuse.  has a past surgical history that includes Colonoscopy (05/2007); Cholecystectomy; Irrigation and debridement foot (Bilateral, 05/25/2018); Lower Extremity Angiography (Left, 05/26/2018); and Lower Extremity Angiography (Right, 05/27/2018). Prior to Admission medications   Medication Sig Start Date End Date Taking? Authorizing Provider  aspirin 81 MG chewable tablet Chew 81 mg by mouth daily. 05/30/17  Yes [provider]  atorvastatin (LIPITOR) 40 MG tablet Take 40 mg by mouth daily. 10/24/17 10/24/18 Yes [provider]  Fluticasone-Salmeterol (ADVAIR DISKUS) 250-50 MCG/DOSE AEPB Inhale 1 puff into the lungs 2 (two) times daily. 03/14/15 01/16/26 Yes Beers, Charmayne Sheerharles M, PA-C  pantoprazole (PROTONIX) 40 MG tablet Take 40 mg by mouth 2 (two) times daily.  04/25/18  Yes [provider]  rivaroxaban (XARELTO) 10 MG TABS tablet Take 10 mg by mouth every evening.   Yes [provider]  SANTYL ointment Apply 1 application topically daily.  05/19/18  Yes [provider]  sodium hypochlorite (DAKIN'S 1/4 STRENGTH) 0.125 % SOLN Apply 1 application topically daily. Use as a wound cleanser, pat dry, then apply dressings 12/18/17  Yes [provider]  SPIRIVA HANDIHALER 18 MCG inhalation capsule Place 18 mcg into inhaler and inhale daily.  05/19/18  Yes [provider]    albuterol (PROVENTIL HFA;VENTOLIN HFA) 108 (90 Base) MCG/ACT inhaler Inhale 2 puffs into the lungs every 6 (six) hours as needed for wheezing or shortness of breath.    [provider]  albuterol (PROVENTIL) (2.5 MG/3ML) 0.083% nebulizer solution Inhale 2.5 mg into the lungs every 6 (six) hours as needed for wheezing or shortness of breath.     [provider]  guaiFENesin-dextromethorphan (ROBITUSSIN DM) 100-10 MG/5ML syrup Take 5 mLs by mouth every 4 (four) hours as needed for cough. 04/11/18   Milagros LollSudini, Srikar, MD  oxyCODONE-acetaminophen (PERCOCET) 10-325 MG tablet Take 1 tablet by mouth every 6 (six) hours as needed for pain.     [provider]   Allergies  Allergen Reactions  . Eggs Or Egg-Derived Products Diarrhea, Itching and Nausea And Vomiting    FAMILY HISTORY:  family history includes Brain cancer in her mother. SOCIAL HISTORY:  reports that she has been smoking cigarettes. She has a 70.50 pack-year smoking history. She has never used smokeless tobacco. She reports that she does not drink alcohol or use drugs.  REVIEW OF SYSTEMS:   Unable to obtain due to critical illness   VITAL SIGNS: Temp:  [97.4 F (36.3 C)-98.2 F (36.8 C)] 97.5 F (36.4 C) (11/27 0800) Pulse Rate:  [71-108] 71 (11/27 0800) Resp:  [11-20] 13 (11/27 0800) BP: (107-145)/(65-86) 118/72 (11/27 0800) SpO2:  [92 %-100 %] 100 % (11/27 0800) Weight:  [50.2 kg] 50.2 kg (11/27 0403)   Physical Examination: awake, oriented, no focal motor deficits ToleratingRA/ [place], no distress, bilateral equal air entry with diffuse expiratory wheezes S1 & S2 are audible with no murmur Benign abdominal exam was normal peristalsis dressed right foot ulcer and left foot ulcer   ASSESSMENT / PLAN:  Acute respiratory failure with hypoxemia and hypercapnia(improved). Tolerating RA/[place] -MonitorO2 sat and work of breathing  Acute exacerbation of COPD -Optimize bronchodilators + inhaled  steroids + tapering systemic steroids + empiric antibiotics  Atelectasis and possible pneumonia with pulmonary congestion. worsenigRight lower airspace disease -Zosyn + vancomycin. Monitor CXR + CBC + FiO2 -Incentive spirometer + Mucinix  AKI with intravascular volume depletion (improved) -Optimize volume, monitor renal panel and urine out put.  Right diabetic foot with diabetic ulcer. No osteomyelitis on MRI.BLD culture 05/22/2018 remains -veOn 05/26/2018 patient underwent percutaneous transluminal angioplasty of left SFA and popliteal arteries. Wound cultures Cult GPC in pairs (Staph. Aureus) -Zosyn + vancomycin.Follow with wound culture -Management as per vascular surgery and podiatry  Diabetes mellitus -Optimize glycemic control -Optimize Levemir and taper Insulin gtt.  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  -Xarelto was resumed and off Heparin gtt.  Anemia -Keep hemoglobin more than 7 g/dL  UTI.MRSA -ABX as above and follows culture  Full code  DVT + GI prophylaxis. Continue supportive care  Critical care time 40 minutes

## 2018-05-28 NOTE — Progress Notes (Signed)
Pharmacy Antibiotic Note  April KillingsDoris J [name] is a 62 y.o. female admitted on 05/22/2018 with wound infection/DM foot infection.  Pharmacy has been consulted for vancomycin dosing. Patient underwent right foot I&D on 11/26.   Plan: Continue vancomycin 1g IV q24hr for goal trough of 15-20. Patients creatinine clearance is significantly improved. Will plan to recheck trough on 11/28. BMP ordered with am labs.   Height: 5\' 2"  (157.5 cm) Weight: 110 lb 10.7 oz (50.2 kg) IBW/kg (Calculated) : 50.1  Temp (24hrs), Avg:97.7 F (36.5 C), Min:97.4 F (36.3 C), Max:98.1 F (36.7 C)  Recent Labs  Lab 05/22/18 2124 05/22/18 2342  05/23/18 0520 05/23/18 0805 05/24/18 0451 05/25/18 0323 05/25/18 2238 05/26/18 0520 05/26/18 2037 05/27/18 0338 05/28/18 0422  WBC 15.6*  --    < >  --   --  15.8* 10.9*  --  12.6*  --  11.6* 13.5*  CREATININE 0.94  --    < >  --   --  1.00 1.19*  --  1.24* 1.40* 1.20* 0.77  LATICACIDVEN 2.5* 1.8  --  1.7 1.5  --   --   --   --   --   --   --   VANCOTROUGH  --   --   --   --   --   --   --  8*  --   --   --   --    < > = values in this interval not displayed.    Estimated Creatinine Clearance: 56.9 mL/min (by C-G formula based on SCr of 0.77 mg/dL).    Allergies  Allergen Reactions  . Eggs Or Egg-Derived Products Diarrhea, Itching and Nausea And Vomiting    Antimicrobials this admission: Ceftriaxone 11/21 x 1  Zosyn 11/22 >> 11/27 Vancomycin 11/22 >>   Dose adjustments this admission: 11/25 Vancomycin transitioned from 500mg  IV Q24hr to 1000mg  IV Q24hr.   Microbiology results: 11/24 WoundCx: MRSA  11/21 BCx: no growth x 5 days  11/22 UCx: > 100K MRSA  11/26 Cdiff: negative  11/23 MRSA PCR: negative   Thank you for allowing pharmacy to be a part of this patient's care.  Simpson,Michael L 05/28/2018 4:34 PM

## 2018-05-29 LAB — BASIC METABOLIC PANEL
ANION GAP: 9 (ref 5–15)
BUN: 21 mg/dL (ref 8–23)
CHLORIDE: 106 mmol/L (ref 98–111)
CO2: 25 mmol/L (ref 22–32)
Calcium: 7.6 mg/dL — ABNORMAL LOW (ref 8.9–10.3)
Creatinine, Ser: 0.86 mg/dL (ref 0.44–1.00)
GFR calc Af Amer: >60 mL/min (ref >60)
Glucose, Bld: 250 mg/dL — ABNORMAL HIGH (ref 70–99)
POTASSIUM: 4.5 mmol/L (ref 3.5–5.1)
SODIUM: 140 mmol/L (ref 135–145)

## 2018-05-29 LAB — CBC
HEMATOCRIT: 30.2 % — AB (ref 36.0–46.0)
Hemoglobin: 9.5 g/dL — ABNORMAL LOW (ref 12.0–15.0)
MCH: 30.4 pg (ref 26.0–34.0)
MCHC: 31.5 g/dL (ref 30.0–36.0)
MCV: 96.5 fL (ref 80.0–100.0)
NRBC: 0.3 % — AB (ref 0.0–0.2)
Platelets: 294 10*3/uL (ref 150–400)
RBC: 3.13 MIL/uL — ABNORMAL LOW (ref 3.87–5.11)
RDW: 13.2 % (ref 11.5–15.5)
WBC: 14.6 10*3/uL — AB (ref 4.0–10.5)

## 2018-05-29 LAB — GLUCOSE, CAPILLARY
GLUCOSE-CAPILLARY: 152 mg/dL — AB (ref 70–99)
GLUCOSE-CAPILLARY: 90 mg/dL (ref 70–99)
GLUCOSE-CAPILLARY: 110 mg/dL — AB (ref 70–99)
Glucose-Capillary: 196 mg/dL — ABNORMAL HIGH (ref 70–99)
Glucose-Capillary: 69 mg/dL — ABNORMAL LOW (ref 70–99)

## 2018-05-29 LAB — PROTIME-INR
INR: 1.41
Prothrombin Time: 17.1 seconds — ABNORMAL HIGH (ref 11.4–15.2)

## 2018-05-29 MED ORDER — PERMETHRIN 5 % EX CREA
TOPICAL_CREAM | Freq: Once | CUTANEOUS | Status: AC
  Administered 2018-05-29: 16:00:00 via TOPICAL
  Filled 2018-05-29: qty 60

## 2018-05-29 NOTE — Progress Notes (Signed)
Patient foley draining blood tinged urine. MD notified. No orders received. Continue to monitor.

## 2018-05-29 NOTE — Progress Notes (Signed)
[facility] [place] [facility]   PATIENT NAME: April [name]    MR#:  [id number]  DATE OF BIRTH:  09-12-1955  SUBJECTIVE:  CHIEF COMPLAINT:   Chief Complaint  Patient presents with  . Wound Check  Patient seen and evaluated today Off IV insulin drip and transferred to medical floor Has some blood-tinged urine in the Foley catheter Pain in the lower extremities better No complaints of any shortness of breath No complaints of any chest pain  REVIEW OF SYSTEMS:  Review of Systems  Constitutional: Malaise, weakness HENT: Negative.   Eyes: Negative.   Respiratory: Shortness of breath improved, wheezing improved Cardiovascular: Palpitations Gastrointestinal: Negative.   Genitourinary: Negative.   Musculoskeletal: Negative.   Skin: Right foot wound Left foot pain better Neurological: Negative.   Endo/Heme/Allergies: Negative.   Psychiatric/Behavioral: Negative.   All other systems reviewed and are negative.  DRUG ALLERGIES:   Allergies  Allergen Reactions  . Eggs Or Egg-Derived Products Diarrhea, Itching and Nausea And Vomiting    VITALS:  Blood pressure 119/62, pulse 79, temperature 98 F (36.7 C), temperature source Oral, resp. rate 18, height 5\' 2"  (1.575 m), weight 50 kg, SpO2 95 %.  PHYSICAL EXAMINATION:  Physical Exam  GENERAL:  62 y.o.-year-old patient lying in the bed with no acute distress.  Malnourished EYES: Pupils equal, round, reactive to light and accommodation. No scleral icterus. Extraocular muscles intact.  HEENT: Head atraumatic, normocephalic. Oropharynx and nasopharynx clear.  NECK:  Supple, no jugular venous distention. No thyroid enlargement, no tenderness.  LUNGS: Improved breath sounds bilaterally, bibasilar crepitations decreased Weaned off oxygen CARDIOVASCULAR: S1, S2 tachycardia noted. No murmurs, rubs, or gallops.  ABDOMEN: Soft, nontender, nondistended. Bowel sounds present. No organomegaly or mass.    EXTREMITIES: Lower extremity bilateral bruits noted Right foot bandage noted NEUROLOGIC: Cranial nerves II through XII are intact. Muscle strength 5/5 in all extremities. Sensation intact. Gait not checked.  PSYCHIATRIC: The patient is alert and oriented x 3.  SKIN: Diffuse macular rash noted on upper and lower extremities including the palms.  Denies any itching   LABORATORY PANEL:   CBC Recent Labs  Lab 05/29/18 0517  WBC 14.6*  HGB 9.5*  HCT 30.2*  PLT 294   ------------------------------------------------------------------------------------------------------------------  Chemistries  Recent Labs  Lab 05/22/18 2124  05/26/18 0520  05/29/18 0517  NA 132*   < > 131*   < > 140  K 5.3*   < > 5.2*   < > 4.5  CL 95*   < > 99   < > 106  CO2 32   < > 24   < > 25  GLUCOSE 513*   < > 659*   < > 250*  BUN 20   < > 57*   < > 21  CREATININE 0.94   < > 1.24*   < > 0.86  CALCIUM 8.3*   < > 7.6*   < > 7.6*  MG  --   --  2.1  --   --   AST 15  --   --   --   --   ALT 10  --   --   --   --   ALKPHOS 108  --   --   --   --   BILITOT 0.3  --   --   --   --    < > = values in this interval not displayed.   ------------------------------------------------------------------------------------------------------------------  Cardiac Enzymes No  results for input(s): TROPONINI in the last 168 hours. ------------------------------------------------------------------------------------------------------------------  RADIOLOGY:  Dg Chest Port 1 View  Result Date: 05/28/2018 CLINICAL DATA:  Follow-up atelectatic changes EXAM: PORTABLE CHEST 1 VIEW COMPARISON:  05/26/2018 FINDINGS: Cardiac shadow is stable. Aortic calcifications are again noted. Bilateral pleural effusions are seen right greater than left. Mild vascular congestion remains with some interstitial edema. No focal confluent infiltrate is noted. IMPRESSION: Bilateral pleural effusions right greater than left. Mild vascular  congestion is noted. Electronically Signed   By: Alcide Clever M.D.   On: 05/28/2018 07:22    EKG:   Orders placed or performed during the hospital encounter of 06/05/16  . ED EKG  . ED EKG  . EKG    ASSESSMENT AND PLAN:   62 year old female with past medical history significant for peripheral vascular disease, ongoing smoking, COPD, diabetes presents to hospital secondary to nonhealing bilateral leg wounds.  1.  Status post hypoxic hypercapnic respiratory failure  Weaned of BiPAP and oxygen via nasal cannula Comfortable on room air  2. Acute COPD exacerbation improved Continue bronchodilators, inhaled steroids Nebulization treatments  3.  Osteomyelitis right foot  & Bilateral lower extremity wounds secondary to poor circulation -Appreciate podiatry consult and intervention -Had I&D procedure in this hospitalization.  Incision and drainage of deep abscess and removal of sesamoid bones and first metatarsal head from the first metatarsal phalangeal joint region was done. Excisional debridement of decubitus ulcer with necrotic eschar measuring 3.1 cm/3.2 cm with depth to the fatty tissue was done by podiatric -Continue IV antibiotics -IV vancomycin antibiotic Podiatry follow-up for further assessment of right foot wound whether patient needs amputation  4.  Diffuse macular rash on the extremities improved  5.  Anemia of chronic disease-stable.   Monitor hb and hct  6.  Peripheral arterial disease with ulceration and infection of the bilateral lower extremities Status post bilateral femoral artery stent placements by vascular surgery  Decreased pain in the lower extremities  7.  DVT prophylaxis-Xarelto resumed  8.   diabetes mellitus type 2 insulin-dependent Diabetic diet Sliding scale coverage with insulin  9.  Blood-tinged urine in the Foley catheter without gross hematuria Will monitor patient closely  All the records are reviewed and case discussed with Care  Management/Social Workerr. Management plans discussed with the patient, family and they are in agreement.  CODE STATUS: Full code  TOTAL  TIME TAKING CARE OF THIS PATIENT: 33 minutes.   POSSIBLE D/C IN 3-4 DAYS, DEPENDING ON CLINICAL CONDITION.   Ihor Austin M.D on 05/29/2018 at 10:33 AM  Between 7am to 6pm - Pager - [phone]  After 6pm go to [email or web address] - Social research officer, government  [facility]  Office  [phone]  CC: Primary care physician; Mickel Fuchs, MD

## 2018-05-29 NOTE — Progress Notes (Signed)
NT noticed "bugs" crawling on pt's gown. Upon inspection, pt had nits and 2 lice were seen. Pt already on contact precautions. Dr. Tobi BastosPyreddy notified, ordered permethrin shampoo. Pt aware.

## 2018-05-30 LAB — GLUCOSE, CAPILLARY
GLUCOSE-CAPILLARY: 113 mg/dL — AB (ref 70–99)
GLUCOSE-CAPILLARY: 196 mg/dL — AB (ref 70–99)
GLUCOSE-CAPILLARY: 213 mg/dL — AB (ref 70–99)
Glucose-Capillary: 166 mg/dL — ABNORMAL HIGH (ref 70–99)

## 2018-05-30 LAB — CBC
HCT: 30.8 % — ABNORMAL LOW (ref 36.0–46.0)
Hemoglobin: 9.6 g/dL — ABNORMAL LOW (ref 12.0–15.0)
MCH: 29.7 pg (ref 26.0–34.0)
MCHC: 31.2 g/dL (ref 30.0–36.0)
MCV: 95.4 fL (ref 80.0–100.0)
NRBC: 0.3 % — AB (ref 0.0–0.2)
PLATELETS: 386 10*3/uL (ref 150–400)
RBC: 3.23 MIL/uL — AB (ref 3.87–5.11)
RDW: 13.1 % (ref 11.5–15.5)
WBC: 14.3 10*3/uL — AB (ref 4.0–10.5)

## 2018-05-30 LAB — PROTIME-INR
INR: 1.43
PROTHROMBIN TIME: 17.3 s — AB (ref 11.4–15.2)

## 2018-05-30 LAB — VANCOMYCIN, TROUGH: Vancomycin Tr: 12 ug/mL — ABNORMAL LOW (ref 15–20)

## 2018-05-30 MED ORDER — VANCOMYCIN HCL IN DEXTROSE 1-5 GM/200ML-% IV SOLN
1000.0000 mg | INTRAVENOUS | Status: DC
  Administered 2018-05-30 – 2018-05-31 (×2): 1000 mg via INTRAVENOUS
  Filled 2018-05-30 (×2): qty 200

## 2018-05-30 NOTE — Care Management Important Message (Signed)
Important Message  Patient Details  Name: April [name] MRN: [id number] Date of Birth: [birth year]   Medicare Important Message Given:  Yes    Collie SiadAngela Samir Ishaq, RN 05/30/2018, 11:57 AM

## 2018-05-30 NOTE — Progress Notes (Signed)
[employer] [facility] - [place] at [facility]   PATIENT NAME: April [name]    MR#:  [id number]  DATE OF BIRTH:  Sep 17, 1955  SUBJECTIVE:  CHIEF COMPLAINT:   Chief Complaint  Patient presents with  . Wound Check  Patient seen and evaluated today Has head lice Pain in the lower extremities better No complaints of any shortness of breath No complaints of any chest pain  REVIEW OF SYSTEMS:  Review of Systems  Constitutional: Malaise, weakness HENT: Head lice Eyes: Negative.   Respiratory: Shortness of breath improved, wheezing improved Cardiovascular: Palpitations Gastrointestinal: Negative.   Genitourinary: Negative.   Musculoskeletal: Negative.   Skin: Right foot wound Left foot pain better Neurological: Negative.   Endo/Heme/Allergies: Negative.   Psychiatric/Behavioral: Negative.   All other systems reviewed and are negative.  DRUG ALLERGIES:   Allergies  Allergen Reactions  . Eggs Or Egg-Derived Products Diarrhea, Itching and Nausea And Vomiting    VITALS:  Blood pressure 127/74, pulse 99, temperature 98.4 F (36.9 C), temperature source Oral, resp. rate 18, height 5\' 2"  (1.575 m), weight 52.1 kg, SpO2 98 %.  PHYSICAL EXAMINATION:  Physical Exam  GENERAL:  [age] y.o.-year-old patient lying in the bed with no acute distress.  Malnourished EYES: Pupils equal, round, reactive to light and accommodation. No scleral icterus. Extraocular muscles intact.  HEENT: Head atraumatic, normocephalic. Oropharynx and nasopharynx clear.  NECK:  Supple, no jugular venous distention. No thyroid enlargement, no tenderness.  LUNGS: Improved breath sounds bilaterally, bibasilar crepitations decreased Weaned off oxygen CARDIOVASCULAR: S1, S2 tachycardia noted. No murmurs, rubs, or gallops.  ABDOMEN: Soft, nontender, nondistended. Bowel sounds present. No organomegaly or mass.  EXTREMITIES: Lower extremity bilateral bruits noted Right foot bandage noted NEUROLOGIC: Cranial  nerves II through XII are intact. Muscle strength 5/5 in all extremities. Sensation intact. Gait not checked.  PSYCHIATRIC: The patient is alert and oriented x 3.  SKIN: Diffuse macular rash noted on upper and lower extremities including the palms.  Denies any itching   LABORATORY PANEL:   CBC Recent Labs  Lab 05/30/18 0425  WBC 14.3*  HGB 9.6*  HCT 30.8*  PLT 386   ------------------------------------------------------------------------------------------------------------------  Chemistries  Recent Labs  Lab 05/26/18 0520  05/29/18 0517  NA 131*   < > 140  K 5.2*   < > 4.5  CL 99   < > 106  CO2 24   < > 25  GLUCOSE 659*   < > 250*  BUN 57*   < > 21  CREATININE 1.24*   < > 0.86  CALCIUM 7.6*   < > 7.6*  MG 2.1  --   --    < > = values in this interval not displayed.   ------------------------------------------------------------------------------------------------------------------  Cardiac Enzymes No results for input(s): TROPONINI in the last 168 hours. ------------------------------------------------------------------------------------------------------------------  RADIOLOGY:  No results found.  EKG:   Orders placed or performed during the hospital encounter of 06/05/16  . ED EKG  . ED EKG  . EKG    ASSESSMENT AND PLAN:   62 year old female with past medical history significant for peripheral vascular disease, ongoing smoking, COPD, diabetes presents to hospital secondary to nonhealing bilateral leg wounds.  1.  Status post hypoxic hypercapnic respiratory failure  Weaned of BiPAP and oxygen via nasal cannula Comfortable on room air  2. Acute COPD exacerbation improved Continue bronchodilators, inhaled steroids Nebulization treatments  3.  Osteomyelitis right foot  & Bilateral lower extremity wounds secondary to poor circulation -Appreciate podiatry  consult and intervention -Had I&D procedure in this hospitalization.  Incision and drainage of deep  abscess and removal of sesamoid bones and first metatarsal head from the first metatarsal phalangeal joint region was done. Excisional debridement of decubitus ulcer with necrotic eschar measuring 3.1 cm/3.2 cm with depth to the fatty tissue was done by podiatric -Continue IV antibiotics -IV vancomycin antibiotic Podiatry follow-up today  4.  Diffuse macular rash on the extremities improved  5.  Anemia of chronic disease-stable.   Monitor hb and hct  6.  Peripheral arterial disease with ulceration and infection of the bilateral lower extremities Status post bilateral femoral artery stent placements by vascular surgery  Decreased pain in the lower extremities  7.  DVT prophylaxis-Xarelto resumed  8.   diabetes mellitus type 2 insulin-dependent Diabetic diet Sliding scale coverage with insulin  9. Head lice : permethrin shampoo started  10. Leucocytosis from osteomyelitis On iv antibiotics  All the records are reviewed and case discussed with Care Management/Social Workerr. Management plans discussed with the patient, family and they are in agreement.  CODE STATUS: Full code  TOTAL  TIME TAKING CARE OF THIS PATIENT: 34 minutes.   POSSIBLE D/C IN 3-4 DAYS, DEPENDING ON CLINICAL CONDITION.   Ihor Austin M.D on 05/30/2018 at 12:10 PM  Between 7am to 6pm - Pager - [phone]  After 6pm go to [email or web address] - Social research officer, government  [facility]  Office  [phone]  CC: Primary care physician; Mickel Fuchs, MD

## 2018-05-30 NOTE — Progress Notes (Signed)
Pharmacy Antibiotic Note  April KillingsDoris J [name] is a 62 y.o. female admitted on 05/22/2018 with wound infection/DM foot infection.  Pharmacy has been consulted for vancomycin dosing. Patient underwent right foot I&D on 11/26.   Plan: Continue vancomycin 1g IV q24hr for goal trough of 15-20. Patients creatinine clearance is significantly improved. Will plan to recheck trough on 11/28. BMP ordered with am labs.   Height: 5\' 2"  (157.5 cm) Weight: 110 lb 3.7 oz (50 kg) IBW/kg (Calculated) : 50.1  Temp (24hrs), Avg:97.9 F (36.6 C), Min:97.7 F (36.5 C), Max:98 F (36.7 C)  Recent Labs  Lab 05/23/18 0520 05/23/18 0805  05/25/18 0323 05/25/18 2238 05/26/18 0520 05/26/18 2037 05/27/18 0338 05/28/18 0422 05/29/18 0517 05/30/18 0022  WBC  --   --    < > 10.9*  --  12.6*  --  11.6* 13.5* 14.6*  --   CREATININE  --   --    < > 1.19*  --  1.24* 1.40* 1.20* 0.77 0.86  --   LATICACIDVEN 1.7 1.5  --   --   --   --   --   --   --   --   --   VANCOTROUGH  --   --   --   --  8*  --   --   --   --   --  12*   < > = values in this interval not displayed.    Estimated Creatinine Clearance: 52.9 mL/min (by C-G formula based on SCr of 0.86 mg/dL).    Allergies  Allergen Reactions  . Eggs Or Egg-Derived Products Diarrhea, Itching and Nausea And Vomiting    Antimicrobials this admission: Ceftriaxone 11/21 x 1  Zosyn 11/22 >> 11/27 Vancomycin 11/22 >>   Dose adjustments this admission: 11/25 Vancomycin transitioned from 500mg  IV Q24hr to 1000mg  IV Q24hr.   11/28 vanc level 12. Changed to 1 gram q 18 hours. Level before 4th new dose.  Microbiology results: 11/24 WoundCx: MRSA  11/21 BCx: no growth x 5 days  11/22 UCx: > 100K MRSA  11/26 Cdiff: negative  11/23 MRSA PCR: negative   Thank you for allowing pharmacy to be a part of this patient's care.  April [name] S 05/30/2018 1:30 AM

## 2018-05-30 NOTE — [place] FL2 (Signed)
[place] MEDICAID FL2 LEVEL OF CARE SCREENING TOOL     IDENTIFICATION  Patient Name: April [name] Birthdate: 1955/08/10 Sex: female Admission Date (Current Location): 05/22/2018  [place] and [name] Number:  Chiropodist and Address:  [facility], [address], [place], [place] [address]      Provider Number: [id number]  Attending Physician Name and Address:  Ihor Austin, MD  Relative Name and Phone Number:       Current Level of Care: Hospital Recommended Level of Care: Skilled Nursing Facility Prior Approval Number:    Date Approved/Denied:   PASRR Number: [id number])  Discharge Plan: SNF    Current Diagnoses: Patient Active Problem List   Diagnosis Date Noted  . Acute respiratory failure with hypoxia and hypercapnia (HCC)   . Acute osteomyelitis of right foot (HCC) 05/23/2018  . Protein-calorie malnutrition, severe 04/10/2018  . Diabetes (HCC) 04/08/2018  . COPD (chronic obstructive pulmonary disease) (HCC) 04/08/2018  . HLD (hyperlipidemia) 04/08/2018  . Diabetic foot ulcer (HCC) 04/08/2018  . 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 Elevated CEA 01/30/2014    Orientation RESPIRATION BLADDER Height & Weight     Self, Time  Normal Continent Weight: 114 lb 13.8 oz (52.1 kg) Height:  5\' 2"  (157.5 cm)  BEHAVIORAL SYMPTOMS/MOOD NEUROLOGICAL BOWEL NUTRITION STATUS      Incontinent Diet(Diet: Carb Modified. )  AMBULATORY STATUS COMMUNICATION OF NEEDS Skin   Extensive Assist Verbally Surgical wounds, PU Stage and Appropriate Care(pressure ulcer on right foot and left heel. )                       Personal Care Assistance Level of Assistance  Bathing, Feeding, Dressing Bathing Assistance: Limited assistance Feeding assistance: Independent Dressing Assistance: Limited assistance     Functional Limitations Info  Sight, Hearing, Speech Sight Info: Adequate Hearing  Info: Impaired Speech Info: Adequate    SPECIAL CARE FACTORS FREQUENCY  PT (By licensed PT), OT (By licensed OT)     PT Frequency: (5) OT Frequency: (5)            Contractures      Additional Factors Info  Code Status, Allergies, Isolation Precautions Code Status Info: (Full Code. ) Allergies Info: (Eggs Or Egg-derived Products)     Isolation Precautions Info: (MRSA Urine Culter and Head Lice. )     Current Medications (05/30/2018):  This is the current hospital active medication list Current Facility-Administered Medications  Medication Dose Route Frequency Provider Last Rate Last Dose  . 0.9 %  sodium chloride infusion   Intravenous Continuous Schnier, Latina Craver, MD   Stopped at 05/26/18 0532  . 0.9 %  sodium chloride infusion   Intravenous PRN Schnier, Latina Craver, MD 5 mL/hr at 05/26/18 0630    . 0.9 %  sodium chloride infusion  250 mL Intravenous PRN Schnier, Latina Craver, MD      . acetaminophen (TYLENOL) tablet 650 mg  650 mg Oral Q6H PRN Schnier, Latina Craver, MD   650 mg at 05/25/18 1521   Or  . acetaminophen (TYLENOL) suppository 650 mg  650 mg Rectal Q6H PRN Schnier, Latina Craver, MD      . albuterol (PROVENTIL) (2.5 MG/3ML) 0.083% nebulizer solution 2.5 mg  2.5 mg Inhalation Q6H PRN Schnier, Latina Craver, MD      . aspirin chewable tablet 81 mg  81 mg Oral Daily Schnier, Latina Craver, MD  81 mg at 05/30/18 1139  . atorvastatin (LIPITOR) tablet 40 mg  40 mg Oral Daily Schnier, Latina CraverGregory G, MD   40 mg at 05/29/18 1818  . bisacodyl (DULCOLAX) EC tablet 5 mg  5 mg Oral Daily PRN Schnier, Latina CraverGregory G, MD      . collagenase (SANTYL) ointment   Topical Daily Troxler, Molli HazardMatthew, DPM      . dextrose 5 %-0.45 % sodium chloride infusion   Intravenous Continuous Uvaldo RisingSamaan, Maged, MD 50 mL/hr at 05/28/18 1837    . dextrose 50 % solution 25 mL  25 mL Intravenous PRN Schnier, Latina CraverGregory G, MD      . docusate sodium (COLACE) capsule 100 mg  100 mg Oral BID Gilda CreaseSchnier, Latina CraverGregory G, MD   100 mg at 05/30/18 1139   . fluticasone furoate-vilanterol (BREO ELLIPTA) 200-25 MCG/INH 1 puff  1 puff Inhalation Daily Schnier, Latina CraverGregory G, MD   1 puff at 05/30/18 1142  . guaiFENesin-dextromethorphan (ROBITUSSIN DM) 100-10 MG/5ML syrup 5 mL  5 mL Oral Q4H PRN Schnier, Latina CraverGregory G, MD   5 mL at 05/23/18 0851  . hydrALAZINE (APRESOLINE) injection 5 mg  5 mg Intravenous Q20 Min PRN Schnier, Latina CraverGregory G, MD      . HYDROcodone-acetaminophen (NORCO/VICODIN) 5-325 MG per tablet 1-2 tablet  1-2 tablet Oral Q4H PRN Schnier, Latina CraverGregory G, MD   2 tablet at 05/30/18 1138  . HYDROmorphone (DILAUDID) injection 1 mg  1 mg Intravenous Once PRN Schnier, Latina CraverGregory G, MD      . insulin aspart (novoLOG) injection 0-5 Units  0-5 Units Subcutaneous QHS Samaan, Maged, MD      . insulin aspart (novoLOG) injection 0-9 Units  0-9 Units Subcutaneous TID WC Eugenie NorrieBlakeney, Dana G, NP   2 Units at 05/30/18 1303  . insulin aspart (novoLOG) injection 3 Units  3 Units Subcutaneous TID WC Eugenie NorrieBlakeney, Dana G, NP   3 Units at 05/30/18 1303  . insulin detemir (LEVEMIR) injection 15 Units  15 Units Subcutaneous Daily Uvaldo RisingSamaan, Maged, MD   15 Units at 05/30/18 1140  . labetalol (NORMODYNE,TRANDATE) injection 10 mg  10 mg Intravenous Q10 min PRN Schnier, Latina CraverGregory G, MD      . multivitamin-lutein (OCUVITE-LUTEIN) capsule 1 capsule  1 capsule Oral Daily Schnier, Latina CraverGregory G, MD   1 capsule at 05/30/18 1139  . ondansetron (ZOFRAN) tablet 4 mg  4 mg Oral Q6H PRN Schnier, Latina CraverGregory G, MD       Or  . ondansetron [facility](ZOFRAN) injection 4 mg  4 mg Intravenous Q6H PRN Schnier, Latina CraverGregory G, MD   4 mg at 05/26/18 0039  . oxyCODONE-acetaminophen (PERCOCET/ROXICET) 5-325 MG per tablet 1 tablet  1 tablet Oral Q6H PRN Schnier, Latina CraverGregory G, MD   1 tablet at 05/29/18 2111   And  . oxyCODONE (Oxy IR/ROXICODONE) immediate release tablet 5 mg  5 mg Oral Q6H PRN Schnier, Latina CraverGregory G, MD   5 mg at 05/29/18 0248  . pantoprazole (PROTONIX) EC tablet 40 mg  40 mg Oral BID Schnier, Latina CraverGregory G, MD   40 mg at 05/30/18  1138  . protein supplement (PREMIER PROTEIN) liquid - approved for s/p bariatric surgery  11 oz Oral BID BM Schnier, Latina CraverGregory G, MD   11 oz at 05/29/18 1510  . rivaroxaban (XARELTO) tablet 10 mg  10 mg Oral Q supper Schnier, Latina CraverGregory G, MD   10 mg at 05/29/18 1818  . sodium chloride flush (NS) 0.9 % injection 3 mL  3 mL Intravenous Q12H Schnier, Latina CraverGregory G, MD  3 mL at 05/30/18 1141  . sodium chloride flush (NS) 0.9 % injection 3 mL  3 mL Intravenous PRN Schnier, Latina Craver, MD   3 mL at 05/29/18 2116  . tiotropium (SPIRIVA) inhalation capsule ([facility] use ONLY) 18 mcg  18 mcg Inhalation Daily Schnier, Latina Craver, MD   18 mcg at 05/30/18 1140  . traZODone (DESYREL) tablet 25 mg  25 mg Oral QHS PRN Schnier, Latina Craver, MD   25 mg at 05/26/18 2303  . vancomycin (VANCOCIN) IVPB 1000 mg/200 mL premix  1,000 mg Intravenous Q18H Schnier, Latina Craver, MD      . vitamin C (ASCORBIC ACID) tablet 500 mg  500 mg Oral BID Schnier, Latina Craver, MD   500 mg at 05/30/18 1139     Discharge Medications: Please see discharge summary for a list of discharge medications.  Relevant Imaging Results:  Relevant Lab Results:   Additional Information (SSN: [id number])  Sasan Wilkie, Darleen Crocker, LCSW

## 2018-05-30 NOTE — Consult Note (Signed)
WOC team will no longer follow. Patient is being managed for wound care per podiatry. NPWT has been DCed and orders are in for enzymatic debridement with possible surgical intervention.   Discussed POC with bedside nurse.  Re consult if needed, will not follow at this time. Thanks  Nathanyel Defenbaugh M.D.[employer]. [employer] MSN, RN,CWOCN, CNS, CWON-AP [phone])

## 2018-05-30 NOTE — Care Management (Signed)
RNCM attempted to meet with patient. She was sitting comfortably in bed asleep and did not awaken. Home health list and RNCm contact number left at bedside. RNCM will follow.

## 2018-05-30 NOTE — Progress Notes (Signed)
Pharmacy Antibiotic Note  April April [name] is April [name] 62 y.o. female admitted on 05/22/2018 with MRSA wound infection/DM foot infection.  Pharmacy has been consulted for vancomycin dosing. Patient underwent right foot I&D on 11/26.   Plan: Continue vancomycin 1g IV q24hr for goal trough of 15-20. Patients creatinine clearance is significantly improved. Will plan to recheck trough on 11/28. BMP ordered with am labs.   11/28 vanc level 12. Changed to 1 gram q 18 hours. Level before 4th new dose.    Height: 5\' 2"  (157.5 cm) Weight: 114 lb 13.8 oz (52.1 kg) IBW/kg (Calculated) : 50.1  Temp (24hrs), Avg:98.1 F (36.7 C), Min:97.7 F (36.5 C), Max:98.4 F (36.9 C)  Recent Labs  Lab 05/25/18 2238 05/26/18 0520 05/26/18 2037 05/27/18 0338 05/28/18 0422 05/29/18 0517 05/30/18 0022 05/30/18 0425  WBC  --  12.6*  --  11.6* 13.5* 14.6*  --  14.3*  CREATININE  --  1.24* 1.40* 1.20* 0.77 0.86  --   --   VANCOTROUGH 8*  --   --   --   --   --  12*  --     Estimated Creatinine Clearance: 53 mL/min (by C-G formula based on SCr of 0.86 mg/dL).    Allergies  Allergen Reactions  . Eggs Or Egg-Derived Products Diarrhea, Itching and Nausea And Vomiting    Antimicrobials this admission: Ceftriaxone 11/21 x 1  Zosyn 11/22 >> 11/27 Vancomycin 11/22 >>   Dose adjustments this admission: 11/25 Vancomycin transitioned from 500mg  IV Q24hr to 1000mg  IV Q24hr.   11/28 vanc level 12. Changed to 1 gram q 18 hours. Level before 4th new dose.  Microbiology results: 11/24 WoundCx: MRSA  11/21 BCx: no growth x 5 days  11/22 UCx: > 100K MRSA  11/26 Cdiff: negative  11/23 MRSA PCR: negative   Thank you for allowing pharmacy to be April [name] part of this patient's care.  April April [name] 05/30/2018 8:38 AM

## 2018-05-30 NOTE — Clinical Social Work Placement (Signed)
   CLINICAL SOCIAL WORK PLACEMENT  NOTE  Date:  05/30/2018  Patient Details  Name: April [name] MRN: [id number] Date of Birth: 08/03/1955  Clinical Social Work is seeking post-discharge placement for this patient at the Skilled  Nursing Facility level of care (*CSW will initial, date and re-position this form in  chart as items are completed):  Yes   Patient/family provided with [place] Clinical Social Work Department's list of facilities offering this level of care within the geographic area requested by the patient (or if unable, by the patient's family).  Yes   Patient/family informed of their freedom to choose among providers that offer the needed level of care, that participate in Medicare, Medicaid or managed care program needed by the patient, have an available bed and are willing to accept the patient.  Yes   Patient/family informed of [facility]'s ownership interest in [facility] and [facility], as well as of the fact that they are under no obligation to receive care at these facilities.  PASRR submitted to EDS on 05/30/18     PASRR number received on 05/30/18     Existing PASRR number confirmed on       FL2 transmitted to all facilities in geographic area requested by pt/family on 05/30/18     FL2 transmitted to all facilities within larger geographic area on       Patient informed that his/her managed care company has contracts with or will negotiate with certain facilities, including the following:            Patient/family informed of bed offers received.  Patient chooses bed at       Physician recommends and patient chooses bed at      Patient to be transferred to   on  .  Patient to be transferred to facility by       Patient family notified on   of transfer.  Name of family member notified:        PHYSICIAN       Additional Comment:    _______________________________________________ Alin Hutchins, Darleen CrockerBailey M, LCSW 05/30/2018, 4:18 PM

## 2018-05-30 NOTE — Progress Notes (Signed)
PT Cancellation Note  Patient Details Name: April [name] MRN: [id number] DOB: 07-14-1955   Cancelled Treatment:    Reason Eval/Treat Not Completed: Other (comment)(Pt held until further weight bearing precautions ordered. PT will follow up as able. )   [name] PT, DPT 3:45 PM,05/30/18 [phone]

## 2018-05-30 NOTE — Clinical Social Work Note (Signed)
Clinical Social Work Assessment  Patient Details  Name: April [name] MRN: [id number] Date of Birth: 09/15/1955  Date of referral:  05/30/18               Reason for consult:  Facility Placement                Permission sought to share information with:  Oceanographeracility Contact Representative Permission granted to share information::  Yes, Verbal Permission Granted  Name::      Skilled Nursing Facility   Agency::   [facility]   Relationship::     Contact Information:     Housing/Transportation Living arrangements for the past 2 months:  Single Family Home Source of Information:  Other (Comment Required)(Roommate.) Patient Interpreter Needed:  None Criminal Activity/Legal Involvement Pertinent to Current Situation/Hospitalization:  No - Comment as needed Significant Relationships:  Other(Comment)(Granddaughter and roommate. ) Lives with:  Roommate Do you feel safe going back to the place where you live?    Need for family participation in patient care:  Yes (Comment)  Care giving concerns:  [place] lives in [place] with her friend Radene KneeChadwick.    Social Worker assessment / plan:  Visual merchandiserClinical Social Worker (CSW) received verbal consult from MD that patient may need SNF. PT is pending. CSW attempted to meet with patient however she was asleep. CSW contacted patient's friend Radene KneeChadwick. Per Radene Kneehadwick patient lives with him in [place] and her daughter TurkeyVictoria abuses substances and is not reliable. Per Radene Kneehadwick patient does not have a HPOA. Radene KneeChadwick reported that patient is probably drowsy today because of pain medications. Per Radene Kneehadwick patient's granddaughter Helmut Musterlicia is very involved in patient's life because the patient raised her. CSW made Radene KneeChadwick aware that PT will evaluate patient and make a recommendation of home health or SNF. Per Radene Kneehadwick patient will prefer to go home but will be open to SNF if needed. FL2 complete and faxed out. CSW will continue to follow and assist as needed.    Employment status:  Disabled (Comment on whether or not currently receiving Disability) Insurance information:  Managed Medicare PT Recommendations:  Not assessed at this time Information / Referral to community resources:  Skilled Nursing Facility  Patient/Family's Response to care:  Patient's friend Radene KneeChadwick stated that patient has no HPOA. Patient is drowsy today.   Patient/Family's Understanding of and Emotional Response to Diagnosis, Current Treatment, and Prognosis:  Patient's friend Radene KneeChadwick was very pleasant and thanked CSW for assistance.   Emotional Assessment Appearance:  Appears stated age Attitude/Demeanor/Rapport:  Unable to Assess Affect (typically observed):  Unable to Assess Orientation:  Oriented to Self, Oriented to Place, Fluctuating Orientation (Suspected and/or reported Sundowners) Alcohol / Substance use:  Not Applicable Psych involvement (Current and /or in the community):  No (Comment)  Discharge Needs  Concerns to be addressed:  Discharge Planning Concerns Readmission within the last 30 days:  No Current discharge risk:  Dependent with Mobility Barriers to Discharge:  Continued Medical Work up   [employer], Darleen CrockerBailey M, LCSW 05/30/2018, 4:19 PM

## 2018-05-30 NOTE — Progress Notes (Signed)
Daily Progress Note   Subjective  - 3 Days Post-Op  F/u b/l foot ulcer  Objective Vitals:   05/29/18 0818 05/29/18 1534 05/30/18 0605 05/30/18 0819  BP: 119/62 125/68  127/74  Pulse: 79 92  99  Resp: 18 18    Temp: 98 F (36.7 C) 97.7 F (36.5 C)  98.4 F (36.9 C)  TempSrc: Oral Oral  Oral  SpO2: 95% 99%  98%  Weight:   52.1 kg   Height:        Physical Exam: Right foot wound remains stable.  No erythema.  No frank purulence.  Minimal erythema along incision.  Left heel with necrotic decubitus ulcer.  Dry at this time.  No drainage.  Minimal surrounding erythema  Laboratory CBC    Component Value Date/Time   WBC 14.3 (H) 05/30/2018 0425   HGB 9.6 (L) 05/30/2018 0425   HGB 13.6 10/14/2014 0644   HCT 30.8 (L) 05/30/2018 0425   HCT 41.3 10/14/2014 0644   PLT 386 05/30/2018 0425   PLT 156 10/14/2014 0644    BMET    Component Value Date/Time   NA 140 05/29/2018 0517   NA 142 10/14/2014 0644   K 4.5 05/29/2018 0517   K 3.6 10/14/2014 0644   CL 106 05/29/2018 0517   CL 109 10/14/2014 0644   CO2 25 05/29/2018 0517   CO2 29 10/14/2014 0644   GLUCOSE 250 (H) 05/29/2018 0517   GLUCOSE 142 (H) 10/14/2014 0644   BUN 21 05/29/2018 0517   BUN 9 10/14/2014 0644   CREATININE 0.86 05/29/2018 0517   CREATININE 0.53 10/14/2014 0644   CALCIUM 7.6 (L) 05/29/2018 0517   CALCIUM 7.7 (L) 10/14/2014 0644   GFRNONAA >60 05/29/2018 0517   GFRNONAA >60 10/14/2014 0644   GFRAA >60 05/29/2018 0517   GFRAA >60 10/14/2014 [id number]    Assessment/Planning: S/P b/l foot debridement.   Right foot is doing well.  Continue with dry dressing changes upon d/c.  Change dressing daily with bulky dry dressing.  Can flush wound with saline with dressing changes.  Left heel is stable but will be very difficult to heal.  For now have written for accuzyme dressings and bulky padded heel dressing.  Use heel protective boots at all times.  No plans for further debridment at this time.  F/U with  Dr. Orland Jarredroxler in 1 week.    Gwyneth RevelsFowler, Donyell Carrell A  05/30/2018, 12:37 PM

## 2018-05-31 LAB — AEROBIC/ANAEROBIC CULTURE (SURGICAL/DEEP WOUND)

## 2018-05-31 LAB — HEMOGLOBIN AND HEMATOCRIT, BLOOD
HEMATOCRIT: 27.6 % — AB (ref 36.0–46.0)
Hemoglobin: 8.6 g/dL — ABNORMAL LOW (ref 12.0–15.0)

## 2018-05-31 LAB — URINALYSIS, COMPLETE (UACMP) WITH MICROSCOPIC
BILIRUBIN URINE: NEGATIVE
Glucose, UA: >=500 mg/dL — AB
Ketones, ur: NEGATIVE mg/dL
NITRITE: NEGATIVE
Protein, ur: 100 mg/dL — AB
RBC / HPF: >50 RBC/hpf (ref 0–5)
Specific Gravity, Urine: 1.014 (ref 1.005–1.030)
Squamous Epithelial / HPF: NONE SEEN (ref 0–5)
pH: 6 (ref 5.0–8.0)

## 2018-05-31 LAB — GLUCOSE, CAPILLARY
Glucose-Capillary: 241 mg/dL — ABNORMAL HIGH (ref 70–99)
Glucose-Capillary: 288 mg/dL — ABNORMAL HIGH (ref 70–99)
Glucose-Capillary: 194 mg/dL — ABNORMAL HIGH (ref 70–99)
Glucose-Capillary: 100 mg/dL — ABNORMAL HIGH (ref 70–99)

## 2018-05-31 LAB — AEROBIC/ANAEROBIC CULTURE W GRAM STAIN (SURGICAL/DEEP WOUND)

## 2018-05-31 MED ORDER — DOXYCYCLINE HYCLATE 100 MG PO TABS
100.0000 mg | ORAL_TABLET | Freq: Two times a day (BID) | ORAL | Status: DC
  Administered 2018-05-31 – 2018-06-01 (×3): 100 mg via ORAL
  Filled 2018-05-31 (×3): qty 1

## 2018-05-31 NOTE — Progress Notes (Addendum)
Notified Dr Anne HahnWillis pt's appears to have hematuria. When changing her brief blood appears to be in it. Also, she has an external catheter, the urine in the cannister appears to be bloody. Received order for u/a

## 2018-05-31 NOTE — Evaluation (Signed)
Physical Therapy Evaluation Patient Details Name: April [name] MRN: [id number] DOB: 1955-11-09 Today's Date: 05/31/2018   History of Present Illness  Pt is a 62 year old female with past medical history that includes peripheral vascular disease, ongoing smoking, COPD, and diabetes presented to hospital secondary to nonhealing bilateral leg wounds.  Pt is now s/p BLE revascularization procedures, incision and drainage of deep abscess and removal of sesamoid bones and first metatarsal head from the first metatarsal phalangeal joint region of the right foot, and excisional debridement of decubitus ulcer of the left heel.  Assessment also includes: hypoxic hypercapnic respiratory failure, hematuria, UTI, acute COPD exacerbation, and head lice.     Clinical Impression  Pt presents with deficits in strength, transfers, mobility, gait, balance, and activity tolerance.  Pt required extra time and effort during bed mobility tasks but no physical assistance. Pt required min A for stability during transfers from bed to chair as well as verbal cues for sequencing to ensure WB through the heel only on the R foot.  Pt tolerated below therex well and reported no adverse symptoms throughout the session.  Pt would not be safe to return to her prior living situation at this time secondary to BLE NWB status with pt having 4 steps to enter her apt with only intermittent help available.  Pt would be at a high risk for WB noncompliance to her BLEs if she were to attempt to return home possibly resulting in poor surgical outcomes/complications.  Pt will benefit from PT services in a SNF setting upon discharge to safely address above deficits for decreased caregiver assistance and eventual return to PLOF.       Follow Up Recommendations SNF    Equipment Recommendations  None recommended by PT    Recommendations for Other Services       Precautions / Restrictions Precautions Precautions: Fall;Other  (comment) Precaution Comments: Isolation precautions Restrictions Weight Bearing Restrictions: Yes RLE Weight Bearing: Non weight bearing(Pt may bear weight through the R heel during transfers only) LLE Weight Bearing: Non weight bearing(Pt may bear weight through the L foot for transfers only) Other Position/Activity Restrictions: Pt to have heel protective boots donned at all times other than during transfers per nursing (prevalon boots)     Mobility  Bed Mobility Overal bed mobility: Modified Independent             General bed mobility comments: Extra time and effort but no physical assistance required  Transfers Overall transfer level: Needs assistance Equipment used: Rolling walker (2 wheeled) Transfers: Stand Pivot Transfers;Sit to/from Stand Sit to Stand: Min assist;From elevated surface Stand pivot transfers: Min assist       General transfer comment: Mod verbal cues to ensure WB through the R heel only during transfer from bed to chair with min A for stability  Ambulation/Gait             General Gait Details: No ambulation at this time per surgeon request  Stairs            Wheelchair Mobility    Modified Rankin (Stroke Patients Only)       Balance Overall balance assessment: Needs assistance Sitting-balance support: Bilateral upper extremity supported Sitting balance-Leahy Scale: Good     Standing balance support: Bilateral upper extremity supported Standing balance-Leahy Scale: Poor Standing balance comment: Min A for stability during transfers  Pertinent Vitals/Pain Pain Assessment: No/denies pain    Home Living Family/patient expects to be discharged to:: Private residence Living Arrangements: Non-relatives/Friends Available Help at Discharge: Family;Friend(s);Available PRN/intermittently Type of Home: Apartment Home Access: Stairs to enter Entrance Stairs-Rails: Right;Left;Can reach  both Entrance Stairs-Number of Steps: 4 Home Layout: One level Home Equipment: Walker - 2 wheels      Prior Function Level of Independence: Needs assistance   Gait / Transfers Assistance Needed: Mod Ind amb with a RW, no fall history  ADL's / Homemaking Assistance Needed: Pt occasionally required assistance from her granddaughter for ADLs        Hand Dominance        Extremity/Trunk Assessment        Lower Extremity Assessment Lower Extremity Assessment: Generalized weakness       Communication      Cognition Arousal/Alertness: Awake/alert Behavior During Therapy: WFL for tasks assessed/performed Overall Cognitive Status: Within Functional Limits for tasks assessed                                        General Comments      Exercises Total Joint Exercises Quad Sets: Strengthening;Both;5 reps;10 reps Gluteal Sets: Strengthening;Both;5 reps;10 reps Hip ABduction/ADduction: AROM;Both;5 reps Straight Leg Raises: AROM;Both;5 reps Long Arc Quad: AROM;Both;10 reps Knee Flexion: AROM;Both;10 reps Marching in Standing: AROM;Both;5 reps;Seated   Assessment/Plan    PT Assessment Patient needs continued PT services  PT Problem List Decreased strength;Decreased activity tolerance;Decreased balance;Decreased knowledge of precautions;Decreased safety awareness       PT Treatment Interventions DME instruction;Functional mobility training;Therapeutic exercise;Therapeutic activities;Patient/family education    PT Goals (Current goals can be found in the Care Plan section)  Acute Rehab PT Goals Patient Stated Goal: To be able to do transfers better to get out of the bed PT Goal Formulation: With patient Time For Goal Achievement: 06/13/18 Potential to Achieve Goals: Good    Frequency 7X/week   Barriers to discharge Inaccessible home environment;Decreased caregiver support Pt NWB through BLEs except for transfers only but has 4 steps to enter home  and only intermittent assistance available     Co-evaluation               AM-PAC PT "6 Clicks" Mobility  Outcome Measure Help needed turning from your back to your side while in a flat bed without using bedrails?: A Little Help needed moving from lying on your back to sitting on the side of a flat bed without using bedrails?: A Little Help needed moving to and from a bed to a chair (including a wheelchair)?: Total Help needed standing up from a chair using your arms (e.g., wheelchair or bedside chair)?: A Little Help needed to walk in hospital room?: Total Help needed climbing 3-5 steps with a railing? : Total 6 Click Score: 12    End of Session Equipment Utilized During Treatment: Gait belt Activity Tolerance: Patient tolerated treatment well Patient left: in chair;with chair alarm set;with call bell/phone within reach;Other (comment)(Prevalon boots donned) Nurse Communication: Mobility status PT Visit Diagnosis: Muscle weakness (generalized) (M62.81);Difficulty in walking, not elsewhere classified (R26.2);Unsteadiness on feet (R26.81)    Time: [id number] PT Time Calculation (min) (ACUTE ONLY): 30 min   Charges:   PT Evaluation $PT Eval Low Complexity: 1 Low PT Treatments $Therapeutic Exercise: 8-22 mins        D. Scott Sherryll Skoczylas PT, DPT 05/31/18, 2:06 PM

## 2018-05-31 NOTE — Progress Notes (Signed)
[facility] [place] [facility]   PATIENT NAME: April [name]    MR#:  [id number]  DATE OF BIRTH:  01-30-1956  SUBJECTIVE:  CHIEF COMPLAINT:   Chief Complaint  Patient presents with  . Wound Check  Patient seen and evaluated today Had a hematuria last evening and on Xarelto for anticoagulation Has head lice and currently on permethrin shampoo Pain in the lower extremities better No complaints of any shortness of breath No complaints of any chest pain  REVIEW OF SYSTEMS:  Review of Systems  Constitutional: Malaise, weakness HENT: Head lice Eyes: Negative.   Respiratory: Shortness of breath improved, wheezing improved Cardiovascular: Palpitations Gastrointestinal: Negative.   Genitourinary: Has hematuria Musculoskeletal: Negative.   Skin: Right foot wound Left foot pain better Neurological: Negative.   Endo/Heme/Allergies: Negative.   Psychiatric/Behavioral: Negative.   All other systems reviewed and are negative.  DRUG ALLERGIES:   Allergies  Allergen Reactions  . Eggs Or Egg-Derived Products Diarrhea, Itching and Nausea And Vomiting    VITALS:  Blood pressure 123/69, pulse 100, temperature 99.3 F (37.4 C), temperature source Oral, resp. rate 18, height 5\' 2"  (1.575 m), weight 52.1 kg, SpO2 97 %.  PHYSICAL EXAMINATION:  Physical Exam  GENERAL:  62 y.o.-year-old patient lying in the bed with no acute distress.  Malnourished EYES: Pupils equal, round, reactive to light and accommodation. No scleral icterus. Extraocular muscles intact.  HEENT: Head atraumatic, normocephalic. Oropharynx and nasopharynx clear.  NECK:  Supple, no jugular venous distention. No thyroid enlargement, no tenderness.  LUNGS: Improved breath sounds bilaterally, bibasilar crepitations decreased Weaned off oxygen CARDIOVASCULAR: S1, S2 tachycardia noted. No murmurs, rubs, or gallops.  ABDOMEN: Soft, nontender, nondistended. Bowel sounds present. No organomegaly or  mass.  EXTREMITIES: Lower extremity bilateral bruits noted Right foot bandage noted NEUROLOGIC: Cranial nerves II through XII are intact. Muscle strength 5/5 in all extremities. Sensation intact. Gait not checked.  PSYCHIATRIC: The patient is alert and oriented x 3.  SKIN: Diffuse macular rash noted on upper and lower extremities including the palms.  Denies any itching   LABORATORY PANEL:   CBC Recent Labs  Lab 05/30/18 0425  WBC 14.3*  HGB 9.6*  HCT 30.8*  PLT 386   ------------------------------------------------------------------------------------------------------------------  Chemistries  Recent Labs  Lab 05/26/18 0520  05/29/18 0517  NA 131*   < > 140  K 5.2*   < > 4.5  CL 99   < > 106  CO2 24   < > 25  GLUCOSE 659*   < > 250*  BUN 57*   < > 21  CREATININE 1.24*   < > 0.86  CALCIUM 7.6*   < > 7.6*  MG 2.1  --   --    < > = values in this interval not displayed.   ------------------------------------------------------------------------------------------------------------------  Cardiac Enzymes No results for input(s): TROPONINI in the last 168 hours. ------------------------------------------------------------------------------------------------------------------  RADIOLOGY:  No results found.  EKG:   Orders placed or performed during the hospital encounter of 06/05/16  . ED EKG  . ED EKG  . EKG    ASSESSMENT AND PLAN:   62 year old female with past medical history significant for peripheral vascular disease, ongoing smoking, COPD, diabetes presents to hospital secondary to nonhealing bilateral leg wounds.  1.  Status post hypoxic hypercapnic respiratory failure  Weaned of BiPAP and oxygen via nasal cannula Comfortable on room air  2.  Hematuria Stop anticoagulation with Xarelto Monitor hemoglobin hematocrit  3.  Urinary tract  infection MRSA in the urine Culture and sensitivities reviewed Switch to oral doxycycline antibiotic Discontinue  vancomycin  4. Acute COPD exacerbation improved Continue bronchodilators, inhaled steroids Nebulization treatments  5.  Osteomyelitis right foot  & Bilateral lower extremity wounds secondary to poor circulation -Appreciate podiatry consult and intervention -Had I&D procedure in this hospitalization.  Incision and drainage of deep abscess and removal of sesamoid bones and first metatarsal head from the first metatarsal phalangeal joint region was done. Excisional debridement of decubitus ulcer with necrotic eschar measuring 3.1 cm/3.2 cm with depth to the fatty tissue was done by podiatry -Continue IV antibiotics -IV vancomycin antibiotic No further surgical intervention suggested by podiatry  6.  Staph infection in the wound Culture and sensitivities reviewed Switch to oral doxycycline antibiotic  7.  Peripheral arterial disease with ulceration and infection of the bilateral lower extremities Status post bilateral femoral artery stent placements by vascular surgery  Decreased pain in the lower extremities  8. Head lice : permethrin shampoo started  9.  Disposition Home with home health services versus SNF Will evaluate home situation  All the records are reviewed and case discussed with Care Management/Social Workerr. Management plans discussed with the patient, family and they are in agreement.  CODE STATUS: Full code  TOTAL  TIME TAKING CARE OF THIS PATIENT: 33 minutes.   POSSIBLE D/C IN 1 to 2 DAYS, DEPENDING ON CLINICAL CONDITION.   Ihor AustinPavan Eulia Hatcher M.D on 05/31/2018 at 12:41 PM  Between 7am to 6pm - Pager - [phone]  After 6pm go to [email or web address] - Social research officer, governmentpassword EPAS [facility]  [facility]  Office  [phone]  CC: Primary care physician; Mickel FuchsWroth, Thomas H, MD

## 2018-06-01 LAB — HEMOGLOBIN AND HEMATOCRIT, BLOOD
HCT: 26 % — ABNORMAL LOW (ref 36.0–46.0)
HEMOGLOBIN: 8 g/dL — AB (ref 12.0–15.0)

## 2018-06-01 LAB — GLUCOSE, CAPILLARY
Glucose-Capillary: 97 mg/dL (ref 70–99)
Glucose-Capillary: 202 mg/dL — ABNORMAL HIGH (ref 70–99)

## 2018-06-01 MED ORDER — OCUVITE-LUTEIN PO CAPS: 1.0000 | ORAL_CAPSULE | Freq: Every day | ORAL | 0 refills | Status: AC

## 2018-06-01 MED ORDER — FERROUS SULFATE 325 (65 FE) MG PO TABS: 325.0000 mg | ORAL_TABLET | Freq: Two times a day (BID) | ORAL | 0 refills | Status: DC

## 2018-06-01 MED ORDER — HYDROCODONE-ACETAMINOPHEN 5-325 MG PO TABS: 1.0000 | ORAL_TABLET | Freq: Four times a day (QID) | ORAL | 0 refills | Status: DC | PRN

## 2018-06-01 MED ORDER — DOXYCYCLINE HYCLATE 100 MG PO TABS: 100.0000 mg | ORAL_TABLET | Freq: Two times a day (BID) | ORAL | 0 refills | Status: DC

## 2018-06-01 MED ORDER — FERROUS SULFATE 325 (65 FE) MG PO TABS: 325.0000 mg | ORAL_TABLET | Freq: Two times a day (BID) | ORAL | Status: DC

## 2018-06-01 NOTE — Progress Notes (Signed)
DISCHARGE NOTE:  Pt given discharge instruction and script (norco). Pt verbalized understanding. Pt wheeled to car by staff. Family here to provide transportation.

## 2018-06-01 NOTE — Progress Notes (Signed)
Physical Therapy Treatment Patient Details Name: April [name] MRN: [id number] DOB: 04-28-1956 Today's Date: 06/01/2018    History of Present Illness Pt is a 62 year old female with past medical history that includes peripheral vascular disease, ongoing smoking, COPD, and diabetes presented to hospital secondary to nonhealing bilateral leg wounds.  Pt is now s/p BLE revascularization procedures, incision and drainage of deep abscess and removal of sesamoid bones and first metatarsal head from the first metatarsal phalangeal joint region of the right foot, and excisional debridement of decubitus ulcer of the left heel.  Assessment also includes: hypoxic hypercapnic respiratory failure, hematuria, UTI, acute COPD exacerbation, and head lice.     PT Comments    Patient received in bed sleeping, agrees to do some LE strengthening. Reports she is going home today with assistance from person she lives with and her granddaughter. Patient educated about NWB status on B LEs. Patient verbalized understanding, states that she has a wheelchair. Will need BSC for home use as she is not to be ambulating. Short term rehab is what is recommended for safety and best outcomes, however patient is refusing.       Follow Up Recommendations  SNF     Equipment Recommendations   wheelchair   Recommendations for Other Services       Precautions / Restrictions Precautions Precautions: Fall Precaution Comments: isolation Restrictions Weight Bearing Restrictions: Yes RLE Weight Bearing: Non weight bearing LLE Weight Bearing: Non weight bearing Other Position/Activity Restrictions: Pt to have heel protective boots donned at all times other than during transfers    Mobility  Bed Mobility                  Transfers                    Ambulation/Gait                 Stairs             Wheelchair Mobility    Modified Rankin (Stroke Patients Only)       Balance                                             Cognition Arousal/Alertness: Awake/alert Behavior During Therapy: WFL for tasks assessed/performed Overall Cognitive Status: Within Functional Limits for tasks assessed                                 General 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 Exercises - Lower Extremity Heel Slides: AROM;10 reps;Both Hip ABduction/ADduction: AROM;10 reps;Both Straight Leg Raises: AROM;10 reps;Both    General Comments        Pertinent Vitals/Pain Pain Assessment: No/denies pain    Home Living Family/patient expects to be discharged to:: Private residence Living Arrangements: Non-relatives/Friends Available Help at Discharge: Family;Friend(s);Available PRN/intermittently Type of Home: Apartment Home Access: Stairs to enter Entrance Stairs-Rails: Right;Left;Can reach both Home Layout: One level Home Equipment: Walker - 2 wheels;Wheelchair - manual      Prior Function Level of Independence: Needs assistance  Gait / Transfers Assistance Needed: Mod Ind amb with a RW, no fall history ADL's / Homemaking Assistance Needed: Pt occasionally required assistance from her granddaughter for ADLs     PT Goals (current goals can  now be found in the care plan section) Acute Rehab PT Goals Patient Stated Goal: To be able to do transfers better to get out of the bed PT Goal Formulation: With patient Time For Goal Achievement: 06/13/18 Potential to Achieve Goals: Good    Frequency    7X/week      PT Plan      Co-evaluation              AM-PAC PT "6 Clicks" Mobility   Outcome Measure  Help needed turning from your back to your side while in a flat bed without using bedrails?: Total Help needed moving from lying on your back to sitting on the side of a flat bed without using bedrails?: Total Help needed moving to and from a bed to a chair (including a wheelchair)?: Total Help needed standing up  from a chair using your arms (e.g., wheelchair or bedside chair)?: A Lot Help needed to walk in hospital room?: Total Help needed climbing 3-5 steps with a railing? : Total 6 Click Score: 7    End of Session   Activity Tolerance: Patient tolerated treatment well Patient left: in bed;with bed alarm set;with call bell/phone within reach   PT Visit Diagnosis: Other abnormalities of gait and mobility (R26.89);Difficulty in walking, not elsewhere classified (R26.2);Muscle weakness (generalized) (M62.81)     Time: [id number] PT Time Calculation (min) (ACUTE ONLY): 15 min  Charges:  $Therapeutic Exercise: 8-22 mins                     Stan Cantave, PT, GCS 06/01/18,12:55 PM

## 2018-06-01 NOTE — Care Management Note (Signed)
Case Management Note  Patient Details  Name: April [name] MRN: [id number] Date of Birth: 1956-01-06  Subjective/Objective:    Patient to be discharged per MD order. Orders in place for home health services. Patient refusing SNF and is active with [facility] home health. Confirmed referral with Elnita MaxwellCheryl who agrees to resume care. There are no DME needs as patient has walker, wheelchair and bedside commode in the home. Lives with partner who drives.                  Action/Plan:   Expected Discharge Date:  06/01/18               Expected Discharge Plan:  Home w Home Health Services  In-House Referral:     Discharge planning Services  CM Consult  Post Acute Care Choice:  Home Health Choice offered to:  Patient  DME Arranged:    DME Agency:     HH Arranged:  RN, PT, Nurse's Aide, Social Work [employer] Agency:  [facility]  Status of Service:  Completed, signed off  If discussed at [employer] Length of [employer] Meetings, dates discussed:    Additional Comments:  Virgel ManifoldJosh A Breunna Nordmann, RN 06/01/2018, 2:31 PM

## 2018-06-01 NOTE — Progress Notes (Addendum)
[facility] - [place] at [facility]   PATIENT NAME: April [name]    MR#:  [id number]  DATE OF BIRTH:  1955-08-29  SUBJECTIVE:  CHIEF COMPLAINT:   Chief Complaint  Patient presents with  . Wound Check  Patient seen and evaluated today Hematuria resolved Has head lice and currently on permethrin shampoo Pain in the lower extremities improved No complaints of any shortness of breath No complaints of any chest pain  REVIEW OF SYSTEMS:  Review of Systems  Constitutional: Malaise, weakness HENT: Head lice Eyes: Negative.   Respiratory: Shortness of breath improved, wheezing improved Cardiovascular: Palpitations Gastrointestinal: Negative.   Genitourinary: Has hematuria Musculoskeletal: Negative.   Skin: Right foot wound Left foot pain better Neurological: Negative.   Endo/Heme/Allergies: Negative.   Psychiatric/Behavioral: Negative.   All other systems reviewed and are negative.  DRUG ALLERGIES:   Allergies  Allergen Reactions  . Eggs Or Egg-Derived Products Diarrhea, Itching and Nausea And Vomiting    VITALS:  Blood pressure 121/64, pulse 98, temperature 98.1 F (36.7 C), temperature source Oral, resp. rate 18, height 5\' 2"  (1.575 m), weight 52.1 kg, SpO2 93 %.  PHYSICAL EXAMINATION:  Physical Exam  GENERAL:  62 y.o.-year-old patient lying in the bed with no acute distress.  Malnourished EYES: Pupils equal, round, reactive to light and accommodation. No scleral icterus. Extraocular muscles intact.  HEENT: Head atraumatic, normocephalic. Oropharynx and nasopharynx clear.  NECK:  Supple, no jugular venous distention. No thyroid enlargement, no tenderness.  LUNGS: Improved breath sounds bilaterally, bibasilar crepitations decreased Weaned off oxygen CARDIOVASCULAR: S1, S2 tachycardia noted. No murmurs, rubs, or gallops.  ABDOMEN: Soft, nontender, nondistended. Bowel sounds present. No organomegaly or mass.  EXTREMITIES: Lower extremity bilateral  bruits noted Right foot bandage noted NEUROLOGIC: Cranial nerves II through XII are intact. Muscle strength 5/5 in all extremities. Sensation intact. Gait not checked.  PSYCHIATRIC: The patient is alert and oriented x 3.  SKIN: Diffuse macular rash noted on upper and lower extremities including the palms.  Denies any itching   LABORATORY PANEL:   CBC Recent Labs  Lab 05/30/18 0425  06/01/18 0332  WBC 14.3*  --   --   HGB 9.6*   < > 8.0*  HCT 30.8*   < > 26.0*  PLT 386  --   --    < > = values in this interval not displayed.   ------------------------------------------------------------------------------------------------------------------  Chemistries  Recent Labs  Lab 05/26/18 0520  05/29/18 0517  NA 131*   < > 140  K 5.2*   < > 4.5  CL 99   < > 106  CO2 24   < > 25  GLUCOSE 659*   < > 250*  BUN 57*   < > 21  CREATININE 1.24*   < > 0.86  CALCIUM 7.6*   < > 7.6*  MG 2.1  --   --    < > = values in this interval not displayed.   ------------------------------------------------------------------------------------------------------------------  Cardiac Enzymes No results for input(s): TROPONINI in the last 168 hours. ------------------------------------------------------------------------------------------------------------------  RADIOLOGY:  No results found.  EKG:   Orders placed or performed during the hospital encounter of 06/05/16  . ED EKG  . ED EKG  . EKG    ASSESSMENT AND PLAN:   62 year old female with past medical history significant for peripheral vascular disease, ongoing smoking, COPD, diabetes presents to hospital secondary to nonhealing bilateral leg wounds.  1.  Status post hypoxic hypercapnic respiratory failure  Weaned of BiPAP and oxygen via nasal cannula Comfortable on room air  2.  Hematuria resolved Hemoglobin stable Has chronic anemia Iron supplementation  3.  Urinary tract infection MRSA in the urine Culture and sensitivities  reviewed Switched to oral doxycycline antibiotic  4. Acute COPD exacerbation improved Continue bronchodilators, inhaled steroids Nebulization treatments  5.  Osteomyelitis right foot  & Bilateral lower extremity wounds secondary to poor circulation -Appreciate podiatry consult and intervention -Had I&D procedure in this hospitalization.  Incision and drainage of deep abscess and removal of sesamoid bones and first metatarsal head from the first metatarsal phalangeal joint region was done. Excisional debridement of decubitus ulcer with necrotic eschar measuring 3.1 cm/3.2 cm with depth to the fatty tissue was done by podiatry -Continue IV antibiotics -IV vancomycin antibiotic No further surgical intervention suggested by podiatry  6.  Staph infection in the wound Culture and sensitivities reviewed Switch to oral doxycycline antibiotic  7.  Peripheral arterial disease with ulceration and infection of the bilateral lower extremities Status post bilateral femoral artery stent placements by vascular surgery  Decreased pain in the lower extremities  8. Head lice : permethrin shampoo started  9.  Disposition Patient does not want to go to SNF Wants to go home with home health services Will discuss with patient's family Discussed with case management services If Family is okay patient will be discharged home   All the records are reviewed and case discussed with Care Management/Social Workerr. Management plans discussed with the patient, family and they are in agreement.  CODE STATUS: Full code  TOTAL  TIME TAKING CARE OF THIS PATIENT: 32 minutes.   POSSIBLE D/C IN 1 to 2 DAYS, DEPENDING ON CLINICAL CONDITION.   Ihor Austin M.D on 06/01/2018 at 1:31 PM  Between 7am to 6pm - Pager - [phone]  After 6pm go to [email or web address] - Social research officer, government  [facility] [place] [facility]  Office  [phone]  CC: Primary care physician; Mickel Fuchs, MD

## 2018-06-01 NOTE — Discharge Summary (Signed)
[facility] - [place] at [facility]   PATIENT NAME: April [name]    MR#:  [id number]  DATE OF BIRTH:  December 27, 1955  DATE OF ADMISSION:  05/22/2018 ADMITTING PHYSICIAN: Cammy CopaAngela Maier, MD  DATE OF DISCHARGE: 06/01/2018  PRIMARY CARE PHYSICIAN: Mickel FuchsWroth, Thomas H, MD   ADMISSION DIAGNOSIS:  Sepsis, due to unspecified organism, unspecified whether acute organ dysfunction present [facility](HCC) [A41.9] Bilateral lower extremity nonhealing ulcers Severe peripheral vascular disease Uncontrolled type 2 diabetes mellitus Tobacco abuse COPD DISCHARGE DIAGNOSIS:  Right foot osteomyelitis Acute hypercapnic respiratory failure Bilateral lower extremity nonhealing ulcers Peripheral vascular disease Type 2 diabetes mellitus Tobacco abuse Sepsis secondary to wound infection and right foot osteomyelitis  SECONDARY DIAGNOSIS:   Past Medical History:  Diagnosis Date  . Anxiety   . Colon polyps   . COPD (chronic obstructive pulmonary disease) (HCC)   . Diabetes mellitus without complication (HCC)   . DVT (deep venous thrombosis) (HCC)   . DVT (deep venous thrombosis) (HCC)   . History of colonic polyps   . Hyperlipidemia   . Peripheral artery disease (HCC)   . Pulmonary nodules/lesions, multiple 08/2012   NEGATIVE PET SCAN  . Retroperitoneal abscess (HCC)    history of in 04/2013  . Tobacco abuse      ADMITTING HISTORY April DonnaDoris Isaac  is a 62 y.o. female with a known history of peripheral vascular disease, tobacco abuse, COPD, diabetes type 2 and nonhealing wounds on her feet.Patient is poor historian and hard of hearing, unable to provide reliable history.  Most information was taken from reviewing the medical records and from discussion with the emergency room physician and the family.Patient presented to emergency room for evaluation of her wounds.  Per family, patient's wounds have been looking worse in the past week.  They also noted a new rash all over her skin, that does not  seem to bother the patient.  She continues to smoke 1-1/2 pack of cigarettes a day and she is noncompliant with her medications including her insulin.  No reports of fever at home.Blood test done emergency room are notable for elevated WBC at 1500, elevated lactic acid at 2.5, elevated glucose level at 513.  Sodium is 132.  Potassium is 5.3.Right foot x-ray shows postoperative right fifth amputation at the level of the mid metatarsal. Persistent cortical scalloping without change, likely postoperative. Soft tissue ulceration over the metatarsal heads. No definite radiographic evidence of osteomyelitis.Patient is admitted for further evaluation and treatment.  HOSPITAL COURSE:  Patient was initially admitted to medical floor.  Started on IV vancomycin and Zosyn antibiotics.  Vascular surgery was consulted for lower extremity nonhealing ulcers along with podiatry.  Patient went into respiratory distress had hypercapnia was transferred to stepdown and put on BiPAP for hypercapnic respiratory failure.  She had a prolonged hospitalization.  Patient needed IV Solu-Medrol nebulization treatments aggressively in the stepdown unit.  Vascular surgery evaluated the patient and patient had angiogram done and has poor circulation in both lower extremities.  She had bilateral lower extremity femoral stents placed and tolerated procedure well.  Patient received PRBC transfusions intravenously during the hospitalization.  She was weaned off BiPAP and oxygen via nasal cannula.  She was tapered off steroids.  Her respiratory distress completely resolved.  Podiatric surgery evaluated the patient.  Patient underwent incision and drainage of the deep abscess and removal of sesamoid bones first metatarsal head from the first metatarsal phalangeal joint region in the right foot for osteomyelitis and deep abscess.  Patient  also underwent excisional debridement of decubitus ulcer with necrotic eschar measuring 3.1 cm/3.2 cm with depth to  the fatty tissue.  Patient tolerated both procedures well.  Once her respiratory distress resolved she was moved to medical floor.  Patient continued IV vancomycin antibiotic during the hospitalization.  Urine culture grew staph aureus and her wound culture also grew staph aureus.  Based on culture and sensitivities patient was switched to oral doxycycline antibiotic.  Patient will be discharged home with home health services.  Will follow-up with vascular surgery in the clinic and podiatric service in the clinic.  Patient refused skilled nursing facility placement.  She wanted to go home with home health services which have been arranged with the help of case management.  Patient also had an episode of hematuria during the hospitalization which was self-limiting and resolved.  Patient will continue iron supplementation as outpatient and is on Xarelto chronically for history of DVT.  During hospitalization patient was worked up with MRI foot as well as serial chest x-rays. Hemoglobin and hematocrit to be closely followed by primary care physician.  CONSULTS OBTAINED:    DRUG ALLERGIES:   Allergies  Allergen Reactions  . Eggs Or Egg-Derived Products Diarrhea, Itching and Nausea And Vomiting    DISCHARGE MEDICATIONS:   Allergies as of 06/01/2018      Reactions   Eggs Or Egg-derived Products Diarrhea, Itching, Nausea And Vomiting      Medication List    STOP taking these medications   oxyCODONE-acetaminophen 10-325 MG tablet Commonly known as:  PERCOCET     TAKE these medications   albuterol (2.5 MG/3ML) 0.083% nebulizer solution Commonly known as:  PROVENTIL Inhale 2.5 mg into the lungs every 6 (six) hours as needed for wheezing or shortness of breath.   albuterol 108 (90 Base) MCG/ACT inhaler Commonly known as:  PROVENTIL HFA;VENTOLIN HFA Inhale 2 puffs into the lungs every 6 (six) hours as needed for wheezing or shortness of breath.   aspirin 81 MG chewable tablet Chew 81 mg by  mouth daily.   atorvastatin 40 MG tablet Commonly known as:  LIPITOR Take 40 mg by mouth daily.   doxycycline 100 MG tablet Commonly known as:  VIBRA-TABS Take 1 tablet (100 mg total) by mouth every 12 (twelve) hours for 7 days.   ferrous sulfate 325 (65 FE) MG tablet Take 1 tablet (325 mg total) by mouth 2 (two) times daily with a meal.   Fluticasone-Salmeterol 250-50 MCG/DOSE Aepb Commonly known as:  ADVAIR Inhale 1 puff into the lungs 2 (two) times daily.   guaiFENesin-dextromethorphan 100-10 MG/5ML syrup Commonly known as:  ROBITUSSIN DM Take 5 mLs by mouth every 4 (four) hours as needed for cough.   HYDROcodone-acetaminophen 5-325 MG tablet Commonly known as:  NORCO/VICODIN Take 1-2 tablets by mouth every 6 (six) hours as needed for moderate pain or severe pain.   multivitamin-lutein Caps capsule Take 1 capsule by mouth daily.   pantoprazole 40 MG tablet Commonly known as:  PROTONIX Take 40 mg by mouth 2 (two) times daily.   SANTYL ointment Generic drug:  collagenase Apply 1 application topically daily.   sodium hypochlorite 0.125 % Soln Commonly known as:  DAKIN'S 1/4 STRENGTH Apply 1 application topically daily. Use as a wound cleanser, pat dry, then apply dressings   SPIRIVA HANDIHALER 18 MCG inhalation capsule Generic drug:  tiotropium Place 18 mcg into inhaler and inhale daily.   XARELTO 10 MG Tabs tablet Generic drug:  rivaroxaban Take 10 mg by  mouth every evening.            Durable Medical Equipment  (From admission, onward)         Start     Ordered   06/01/18 1416  For home use only DME Walker rolling  Once    Question:  Patient needs a walker to treat with the following condition  Answer:  Gait instability   06/01/18 1416          Today  Patient seen and evaluated today Hematuria completely resolved Tolerating diet well Refused skilled nursing facility placement Wants to go home with home health services VITAL SIGNS:  Blood  pressure 121/64, pulse 98, temperature 98.1 F (36.7 C), temperature source Oral, resp. rate 18, height 5\' 2"  (1.575 m), weight 52.1 kg, SpO2 93 %.  I/O:  No intake or output data in the 24 hours ending 06/01/18 1425  PHYSICAL EXAMINATION:  Physical Exam  GENERAL:  62 y.o.-year-old patient lying in the bed with no acute distress.  LUNGS: Normal breath sounds bilaterally, no wheezing, rales,rhonchi or crepitation. No use of accessory muscles of respiration.  CARDIOVASCULAR: S1, S2 normal. No murmurs, rubs, or gallops.  ABDOMEN: Soft, non-tender, non-distended. Bowel sounds present. No organomegaly or mass.  NEUROLOGIC: Moves all 4 extremities. PSYCHIATRIC: The patient is alert and oriented x 3.  SKIN: Bilateral lower extremity bandages noted  DATA REVIEW:   CBC Recent Labs  Lab 05/30/18 0425  06/01/18 0332  WBC 14.3*  --   --   HGB 9.6*   < > 8.0*  HCT 30.8*   < > 26.0*  PLT 386  --   --    < > = values in this interval not displayed.    Chemistries  Recent Labs  Lab 05/26/18 0520  05/29/18 0517  NA 131*   < > 140  K 5.2*   < > 4.5  CL 99   < > 106  CO2 24   < > 25  GLUCOSE 659*   < > 250*  BUN 57*   < > 21  CREATININE 1.24*   < > 0.86  CALCIUM 7.6*   < > 7.6*  MG 2.1  --   --    < > = values in this interval not displayed.    Cardiac Enzymes No results for input(s): TROPONINI in the last 168 hours.  Microbiology Results  Results for orders placed or performed during the hospital encounter of 05/22/18  Culture, blood (routine x 2)     Status: None   Collection Time: 05/22/18 12:35 AM  Result Value Ref Range Status   Specimen Description BLOOD LEFT ANTECUBITAL  Final   Special Requests   Final    BOTTLES DRAWN AEROBIC AND ANAEROBIC Blood Culture adequate volume   Culture   Final    NO GROWTH 5 DAYS Performed at [facility], [address] [place], [place] [address]    Report Status 05/28/2018 FINAL  Final  Culture, blood (routine x 2)      Status: None   Collection Time: 05/22/18 11:42 PM  Result Value Ref Range Status   Specimen Description BLOOD LEFT ANTECUBITAL  Final   Special Requests   Final    BOTTLES DRAWN AEROBIC AND ANAEROBIC Blood Culture results may not be optimal due to an excessive volume of blood received in culture bottles   Culture   Final    NO GROWTH 5 DAYS Performed at [facility], [address], [place], [place] [address]    Report Status 05/28/2018 FINAL  Final  Urine Culture     Status: Abnormal   Collection Time: 05/23/18 11:47 PM  Result Value Ref Range Status   Specimen Description   Final    URINE, RANDOM Performed at [facility], [address] [place], [place] [address]    Special Requests   Final    NONE Performed at [facility], [address] [place], [place] [address]    Culture (A)  Final    >=100,000 COLONIES/mL METHICILLIN RESISTANT STAPHYLOCOCCUS AUREUS   Report Status 05/26/2018 FINAL  Final   Organism ID, Bacteria METHICILLIN RESISTANT STAPHYLOCOCCUS AUREUS (A)  Final      Susceptibility   Methicillin resistant staphylococcus aureus - MIC*    CIPROFLOXACIN >=8 RESISTANT Resistant     GENTAMICIN <=0.5 SENSITIVE Sensitive     NITROFURANTOIN <=16 SENSITIVE Sensitive     OXACILLIN >=4 RESISTANT Resistant     TETRACYCLINE <=1 SENSITIVE Sensitive     VANCOMYCIN 1 SENSITIVE Sensitive     TRIMETH/SULFA <=10 SENSITIVE Sensitive     CLINDAMYCIN <=0.25 SENSITIVE Sensitive     RIFAMPIN <=0.5 SENSITIVE Sensitive     Inducible Clindamycin NEGATIVE Sensitive     * >=100,000 COLONIES/mL METHICILLIN RESISTANT STAPHYLOCOCCUS AUREUS  MRSA PCR Screening     Status: None   Collection Time: 05/24/18  8:55 AM  Result Value Ref Range Status   MRSA by PCR NEGATIVE NEGATIVE Final    Comment:        The GeneXpert MRSA Assay (FDA approved for NASAL specimens only), is one component of a comprehensive MRSA colonization surveillance program. It is  not intended to diagnose MRSA infection nor to guide or monitor treatment for MRSA infections. Performed at [facility], [address] [place], [place] [address]   Aerobic/Anaerobic Culture (surgical/deep wound)     Status: None   Collection Time: 05/25/18  5:26 PM  Result Value Ref Range Status   Specimen Description   Final    BONE Performed at [facility], [address], [place], [place] [address]    Special Requests   Final    NONE Performed at [facility], [address] [place], [place] [address]    Gram Stain   Final    RARE WBC PRESENT, PREDOMINANTLY PMN FEW GRAM POSITIVE COCCI IN PAIRS    Culture   Final    FEW METHICILLIN RESISTANT STAPHYLOCOCCUS AUREUS FEW STAPHYLOCOCCUS LUGDUNENSIS NO ANAEROBES ISOLATED Performed at [facility], [address], [place], [place] [address]    Report Status 05/31/2018 FINAL  Final   Organism ID, Bacteria METHICILLIN RESISTANT STAPHYLOCOCCUS AUREUS  Final   Organism ID, Bacteria STAPHYLOCOCCUS LUGDUNENSIS  Final      Susceptibility   Methicillin resistant staphylococcus aureus - MIC*    CIPROFLOXACIN >=8 RESISTANT Resistant     ERYTHROMYCIN <=0.25 SENSITIVE Sensitive     GENTAMICIN <=0.5 SENSITIVE Sensitive     OXACILLIN >=4 RESISTANT Resistant     TETRACYCLINE <=1 SENSITIVE Sensitive     VANCOMYCIN 1 SENSITIVE Sensitive     TRIMETH/SULFA <=10 SENSITIVE Sensitive     CLINDAMYCIN <=0.25 SENSITIVE Sensitive     RIFAMPIN <=0.5 SENSITIVE Sensitive     Inducible Clindamycin NEGATIVE Sensitive     * FEW METHICILLIN RESISTANT STAPHYLOCOCCUS AUREUS   Staphylococcus lugdunensis - MIC*    CIPROFLOXACIN <=0.5 SENSITIVE Sensitive     ERYTHROMYCIN <=0.25 SENSITIVE Sensitive  GENTAMICIN <=0.5 SENSITIVE Sensitive     OXACILLIN 2 SENSITIVE Sensitive     TETRACYCLINE <=1 SENSITIVE Sensitive     VANCOMYCIN <=0.5 SENSITIVE Sensitive     TRIMETH/SULFA <=10 SENSITIVE Sensitive     CLINDAMYCIN  <=0.25 SENSITIVE Sensitive     RIFAMPIN <=0.5 SENSITIVE Sensitive     Inducible Clindamycin NEGATIVE Sensitive     * FEW STAPHYLOCOCCUS LUGDUNENSIS  C difficile quick scan w PCR reflex     Status: None   Collection Time: 05/27/18  7:45 PM  Result Value Ref Range Status   C Diff antigen NEGATIVE NEGATIVE Final   C Diff toxin NEGATIVE NEGATIVE Final   C Diff interpretation No C. difficile detected.  Final    Comment: Performed at [facility], [address] [place], [place] [address]    RADIOLOGY:  No results found.  Follow up with PCP in 1 week.  Management plans discussed with the patient, family and they are in agreement.  CODE STATUS: Full code    Code Status Orders  (From admission, onward)         Start     Ordered   05/23/18 0253  Full code  Continuous     05/23/18 0253        Code Status History    Date Active Date Inactive Code Status Order ID Comments User Context   05/23/2018 0138 05/23/2018 0253 DNR [id number]  Cammy Copa, MD ED   04/10/2018 1111 04/11/2018 1554 DNR [id number]  Milagros Loll, MD Inpatient   04/09/2018 1519 04/10/2018 1111 Full Code [id number]  Milagros Loll, MD Inpatient   04/09/2018 1337 04/09/2018 1519 DNR [id number]  Milagros Loll, MD Inpatient   04/09/2018 0012 04/09/2018 1337 Full Code [id number]  Oralia Manis, MD ED      TOTAL TIME TAKING CARE OF THIS PATIENT ON DAY OF DISCHARGE: more than 35 minutes.   Ihor Austin M.D on 06/01/2018 at 2:25 PM  Between 7am to 6pm - Pager - [phone]  After 6pm go to [email or web address] - password [email or web address]  [facility] [place] [facility]  Office  [phone]  CC: Primary care physician; Mickel Fuchs, MD  Note: This dictation was prepared with Dragon dictation along with smaller phrase technology. Any transcriptional errors that result from this process are unintentional.

## 2018-06-01 NOTE — Clinical Social Work Note (Signed)
The CSW received [facility] the patient's [facility]. The CSW is signing off. Please consult should needs or decision change.  Argentina PonderKaren Martha Homero Hyson, MSW, Theresia MajorsLCSWA [phone]

## 2018-06-03 ENCOUNTER — Other Ambulatory Visit: Payer: Self-pay

## 2018-06-03 ENCOUNTER — Emergency Department: Payer: Medicare HMO

## 2018-06-03 ENCOUNTER — Inpatient Hospital Stay: Payer: Medicare HMO

## 2018-06-03 ENCOUNTER — Inpatient Hospital Stay
Admission: EM | Admit: 2018-06-03 | Discharge: 2018-06-13 | DRG: 871 | Disposition: A | Discharge status: Home Health | Payer: Medicare HMO | Attending: Internal Medicine | Admitting: Internal Medicine

## 2018-06-03 DIAGNOSIS — R918 Other nonspecific abnormal finding of lung field: Secondary | ICD-10-CM | POA: Diagnosis present

## 2018-06-03 DIAGNOSIS — Z9049 Acquired absence of other specified parts of digestive tract: Secondary | ICD-10-CM

## 2018-06-03 DIAGNOSIS — Z808 Family history of malignant neoplasm of other organs or systems: Secondary | ICD-10-CM

## 2018-06-03 DIAGNOSIS — H9190 Unspecified hearing loss, unspecified ear: Secondary | ICD-10-CM | POA: Diagnosis present

## 2018-06-03 DIAGNOSIS — E1151 Type 2 diabetes mellitus with diabetic peripheral angiopathy without gangrene: Secondary | ICD-10-CM | POA: Diagnosis present

## 2018-06-03 DIAGNOSIS — A419 Sepsis, unspecified organism: Secondary | ICD-10-CM | POA: Diagnosis present

## 2018-06-03 DIAGNOSIS — E875 Hyperkalemia: Secondary | ICD-10-CM | POA: Diagnosis present

## 2018-06-03 DIAGNOSIS — B9620 Unspecified Escherichia coli [E. coli] as the cause of diseases classified elsewhere: Secondary | ICD-10-CM | POA: Diagnosis present

## 2018-06-03 DIAGNOSIS — R0603 Acute respiratory distress: Secondary | ICD-10-CM

## 2018-06-03 DIAGNOSIS — E872 Acidosis: Secondary | ICD-10-CM | POA: Diagnosis present

## 2018-06-03 DIAGNOSIS — R778 Other specified abnormalities of plasma proteins: Secondary | ICD-10-CM

## 2018-06-03 DIAGNOSIS — R7989 Other specified abnormal findings of blood chemistry: Secondary | ICD-10-CM

## 2018-06-03 DIAGNOSIS — Z79899 Other long term (current) drug therapy: Secondary | ICD-10-CM

## 2018-06-03 DIAGNOSIS — D638 Anemia in other chronic diseases classified elsewhere: Secondary | ICD-10-CM | POA: Diagnosis present

## 2018-06-03 DIAGNOSIS — J9602 Acute respiratory failure with hypercapnia: Secondary | ICD-10-CM

## 2018-06-03 DIAGNOSIS — E1169 Type 2 diabetes mellitus with other specified complication: Secondary | ICD-10-CM | POA: Diagnosis present

## 2018-06-03 DIAGNOSIS — E860 Dehydration: Secondary | ICD-10-CM | POA: Diagnosis present

## 2018-06-03 DIAGNOSIS — N179 Acute kidney failure, unspecified: Secondary | ICD-10-CM | POA: Diagnosis present

## 2018-06-03 DIAGNOSIS — Z9114 Patient's other noncompliance with medication regimen: Secondary | ICD-10-CM

## 2018-06-03 DIAGNOSIS — J9601 Acute respiratory failure with hypoxia: Secondary | ICD-10-CM | POA: Diagnosis not present

## 2018-06-03 DIAGNOSIS — Z91012 Allergy to eggs: Secondary | ICD-10-CM

## 2018-06-03 DIAGNOSIS — J9621 Acute and chronic respiratory failure with hypoxia: Secondary | ICD-10-CM | POA: Diagnosis present

## 2018-06-03 DIAGNOSIS — E11621 Type 2 diabetes mellitus with foot ulcer: Secondary | ICD-10-CM | POA: Diagnosis present

## 2018-06-03 DIAGNOSIS — Z7951 Long term (current) use of inhaled steroids: Secondary | ICD-10-CM

## 2018-06-03 DIAGNOSIS — R0902 Hypoxemia: Secondary | ICD-10-CM

## 2018-06-03 DIAGNOSIS — E1140 Type 2 diabetes mellitus with diabetic neuropathy, unspecified: Secondary | ICD-10-CM | POA: Diagnosis present

## 2018-06-03 DIAGNOSIS — I248 Other forms of acute ischemic heart disease: Secondary | ICD-10-CM | POA: Diagnosis present

## 2018-06-03 DIAGNOSIS — F17210 Nicotine dependence, cigarettes, uncomplicated: Secondary | ICD-10-CM | POA: Diagnosis present

## 2018-06-03 DIAGNOSIS — R059 Cough, unspecified: Secondary | ICD-10-CM

## 2018-06-03 DIAGNOSIS — K219 Gastro-esophageal reflux disease without esophagitis: Secondary | ICD-10-CM | POA: Diagnosis present

## 2018-06-03 DIAGNOSIS — L97429 Non-pressure chronic ulcer of left heel and midfoot with unspecified severity: Secondary | ICD-10-CM | POA: Diagnosis present

## 2018-06-03 DIAGNOSIS — N390 Urinary tract infection, site not specified: Secondary | ICD-10-CM

## 2018-06-03 DIAGNOSIS — E1165 Type 2 diabetes mellitus with hyperglycemia: Secondary | ICD-10-CM | POA: Diagnosis present

## 2018-06-03 DIAGNOSIS — N3000 Acute cystitis without hematuria: Secondary | ICD-10-CM | POA: Diagnosis present

## 2018-06-03 DIAGNOSIS — N3289 Other specified disorders of bladder: Secondary | ICD-10-CM | POA: Diagnosis present

## 2018-06-03 DIAGNOSIS — E785 Hyperlipidemia, unspecified: Secondary | ICD-10-CM | POA: Diagnosis present

## 2018-06-03 DIAGNOSIS — F419 Anxiety disorder, unspecified: Secondary | ICD-10-CM | POA: Diagnosis present

## 2018-06-03 DIAGNOSIS — J441 Chronic obstructive pulmonary disease with (acute) exacerbation: Secondary | ICD-10-CM | POA: Diagnosis present

## 2018-06-03 DIAGNOSIS — R05 Cough: Secondary | ICD-10-CM

## 2018-06-03 DIAGNOSIS — Z86718 Personal history of other venous thrombosis and embolism: Secondary | ICD-10-CM

## 2018-06-03 DIAGNOSIS — E11649 Type 2 diabetes mellitus with hypoglycemia without coma: Secondary | ICD-10-CM | POA: Diagnosis present

## 2018-06-03 DIAGNOSIS — Z8719 Personal history of other diseases of the digestive system: Secondary | ICD-10-CM

## 2018-06-03 DIAGNOSIS — Z7901 Long term (current) use of anticoagulants: Secondary | ICD-10-CM

## 2018-06-03 DIAGNOSIS — J449 Chronic obstructive pulmonary disease, unspecified: Secondary | ICD-10-CM

## 2018-06-03 DIAGNOSIS — I5020 Unspecified systolic (congestive) heart failure: Secondary | ICD-10-CM | POA: Diagnosis present

## 2018-06-03 DIAGNOSIS — K5900 Constipation, unspecified: Secondary | ICD-10-CM | POA: Diagnosis present

## 2018-06-03 DIAGNOSIS — R748 Abnormal levels of other serum enzymes: Secondary | ICD-10-CM

## 2018-06-03 DIAGNOSIS — Z79891 Long term (current) use of opiate analgesic: Secondary | ICD-10-CM

## 2018-06-03 DIAGNOSIS — L97519 Non-pressure chronic ulcer of other part of right foot with unspecified severity: Secondary | ICD-10-CM

## 2018-06-03 DIAGNOSIS — Z7982 Long term (current) use of aspirin: Secondary | ICD-10-CM

## 2018-06-03 LAB — COMPREHENSIVE METABOLIC PANEL
ALT: 56 U/L — ABNORMAL HIGH (ref 0–44)
AST: 51 U/L — ABNORMAL HIGH (ref 15–41)
Albumin: 2.2 g/dL — ABNORMAL LOW (ref 3.5–5.0)
Alkaline Phosphatase: 246 U/L — ABNORMAL HIGH (ref 38–126)
Anion gap: 12 (ref 5–15)
BUN: 47 mg/dL — ABNORMAL HIGH (ref 8–23)
CO2: 25 mmol/L (ref 22–32)
CREATININE: 1.11 mg/dL — AB (ref 0.44–1.00)
Calcium: 8.3 mg/dL — ABNORMAL LOW (ref 8.9–10.3)
Chloride: 101 mmol/L (ref 98–111)
GFR calc non Af Amer: 53 mL/min — ABNORMAL LOW (ref >60)
Glucose, Bld: 480 mg/dL — ABNORMAL HIGH (ref 70–99)
Potassium: 4.5 mmol/L (ref 3.5–5.1)
Sodium: 138 mmol/L (ref 135–145)
Total Bilirubin: 0.6 mg/dL (ref 0.3–1.2)
Total Protein: 6.5 g/dL (ref 6.5–8.1)

## 2018-06-03 LAB — BLOOD GAS, ARTERIAL
Acid-Base Excess: 0.8 mmol/L (ref 0.0–2.0)
Bicarbonate: 26 mmol/L (ref 20.0–28.0)
DELIVERY SYSTEMS: POSITIVE
Expiratory PAP: 5
FIO2: 0.4
Inspiratory PAP: 12
Mechanical Rate: 8
O2 Saturation: 99.2 %
Patient temperature: 37
pCO2 arterial: 43 mmHg (ref 32.0–48.0)
pH, Arterial: 7.39 (ref 7.350–7.450)
pO2, Arterial: 143 mmHg — ABNORMAL HIGH (ref 83.0–108.0)

## 2018-06-03 LAB — URINALYSIS, COMPLETE (UACMP) WITH MICROSCOPIC
Bacteria, UA: NONE SEEN
Bilirubin Urine: NEGATIVE
Glucose, UA: >=500 mg/dL — AB
Ketones, ur: NEGATIVE mg/dL
Nitrite: NEGATIVE
Protein, ur: 30 mg/dL — AB
RBC / HPF: >50 RBC/hpf — ABNORMAL HIGH (ref 0–5)
SPECIFIC GRAVITY, URINE: 1.017 (ref 1.005–1.030)
WBC, UA: >50 WBC/hpf — ABNORMAL HIGH (ref 0–5)
pH: 5 (ref 5.0–8.0)

## 2018-06-03 LAB — CBC WITH DIFFERENTIAL/PLATELET
Abs Immature Granulocytes: 0.1 10*3/uL — ABNORMAL HIGH (ref 0.00–0.07)
Basophils Absolute: 0 10*3/uL (ref 0.0–0.1)
Basophils Relative: 0 %
EOS ABS: 0 10*3/uL (ref 0.0–0.5)
EOS PCT: 0 %
HCT: 27.1 % — ABNORMAL LOW (ref 36.0–46.0)
Hemoglobin: 8.3 g/dL — ABNORMAL LOW (ref 12.0–15.0)
IMMATURE GRANULOCYTES: 1 %
Lymphocytes Relative: 6 %
Lymphs Abs: 0.6 10*3/uL — ABNORMAL LOW (ref 0.7–4.0)
MCH: 29.9 pg (ref 26.0–34.0)
MCHC: 30.6 g/dL (ref 30.0–36.0)
MCV: 97.5 fL (ref 80.0–100.0)
Monocytes Absolute: 0.5 10*3/uL (ref 0.1–1.0)
Monocytes Relative: 5 %
NEUTROS PCT: 88 %
NRBC: 0.2 % (ref 0.0–0.2)
Neutro Abs: 8.9 10*3/uL — ABNORMAL HIGH (ref 1.7–7.7)
Platelets: 346 10*3/uL (ref 150–400)
RBC: 2.78 MIL/uL — ABNORMAL LOW (ref 3.87–5.11)
RDW: 13.6 % (ref 11.5–15.5)
WBC: 10.1 10*3/uL (ref 4.0–10.5)

## 2018-06-03 LAB — TROPONIN I
Troponin I: 0.5 ng/mL (ref <0.03)
Troponin I: 0.36 ng/mL (ref <0.03)
Troponin I: 0.37 ng/mL (ref <0.03)

## 2018-06-03 LAB — GLUCOSE, CAPILLARY
Glucose-Capillary: 449 mg/dL — ABNORMAL HIGH (ref 70–99)
Glucose-Capillary: 438 mg/dL — ABNORMAL HIGH (ref 70–99)
Glucose-Capillary: 350 mg/dL — ABNORMAL HIGH (ref 70–99)
Glucose-Capillary: 259 mg/dL — ABNORMAL HIGH (ref 70–99)
Glucose-Capillary: 120 mg/dL — ABNORMAL HIGH (ref 70–99)

## 2018-06-03 LAB — LACTIC ACID, PLASMA
Lactic Acid, Venous: 4.4 mmol/L (ref 0.5–1.9)
Lactic Acid, Venous: 2.8 mmol/L (ref 0.5–1.9)

## 2018-06-03 LAB — PROCALCITONIN: Procalcitonin: 0.14 ng/mL

## 2018-06-03 LAB — MRSA PCR SCREENING: MRSA by PCR: NEGATIVE

## 2018-06-03 MED ORDER — DOXYCYCLINE HYCLATE 100 MG PO TABS
100.0000 mg | ORAL_TABLET | Freq: Two times a day (BID) | ORAL | Status: DC
  Administered 2018-06-03 – 2018-06-05 (×4): 100 mg via ORAL
  Filled 2018-06-03 (×5): qty 1

## 2018-06-03 MED ORDER — INSULIN ASPART 100 UNIT/ML ~~LOC~~ SOLN
10.0000 [IU] | Freq: Once | SUBCUTANEOUS | Status: AC
  Administered 2018-06-03: 10 [IU] via SUBCUTANEOUS
  Filled 2018-06-03: qty 1

## 2018-06-03 MED ORDER — METHYLPREDNISOLONE SODIUM SUCC 125 MG IJ SOLR
125.0000 mg | Freq: Once | INTRAMUSCULAR | Status: AC
  Administered 2018-06-03: 125 mg via INTRAVENOUS
  Filled 2018-06-03: qty 2

## 2018-06-03 MED ORDER — TIOTROPIUM BROMIDE MONOHYDRATE 18 MCG IN CAPS
18.0000 ug | ORAL_CAPSULE | Freq: Every day | RESPIRATORY_TRACT | Status: DC
  Filled 2018-06-03: qty 5

## 2018-06-03 MED ORDER — FLUTICASONE FUROATE-VILANTEROL 200-25 MCG/INH IN AEPB
1.0000 | INHALATION_SPRAY | Freq: Every day | RESPIRATORY_TRACT | Status: DC
  Filled 2018-06-03: qty 28

## 2018-06-03 MED ORDER — DAKINS (1/4 STRENGTH) 0.125 % EX SOLN
1.0000 "application " | Freq: Every day | CUTANEOUS | Status: DC
  Administered 2018-06-03: 1 via TOPICAL
  Filled 2018-06-03 (×3): qty 473

## 2018-06-03 MED ORDER — PANTOPRAZOLE SODIUM 40 MG PO TBEC
40.0000 mg | DELAYED_RELEASE_TABLET | Freq: Two times a day (BID) | ORAL | Status: DC
  Administered 2018-06-03 – 2018-06-13 (×20): 40 mg via ORAL
  Filled 2018-06-03 (×21): qty 1

## 2018-06-03 MED ORDER — GUAIFENESIN-DM 100-10 MG/5ML PO SYRP
5.0000 mL | ORAL_SOLUTION | ORAL | Status: DC | PRN
  Filled 2018-06-03: qty 5

## 2018-06-03 MED ORDER — SODIUM CHLORIDE 0.9 % IV SOLN
1.0000 g | INTRAVENOUS | Status: DC
  Administered 2018-06-03 – 2018-06-04 (×2): 1 g via INTRAVENOUS
  Filled 2018-06-03: qty 10
  Filled 2018-06-03: qty 1
  Filled 2018-06-03: qty 10
  Filled 2018-06-03: qty 1

## 2018-06-03 MED ORDER — IPRATROPIUM-ALBUTEROL 0.5-2.5 (3) MG/3ML IN SOLN
3.0000 mL | Freq: Four times a day (QID) | RESPIRATORY_TRACT | Status: DC
  Administered 2018-06-03 – 2018-06-07 (×16): 3 mL via RESPIRATORY_TRACT
  Filled 2018-06-03 (×17): qty 3

## 2018-06-03 MED ORDER — ACETAMINOPHEN 650 MG RE SUPP: 650.0000 mg | Freq: Four times a day (QID) | RECTAL | Status: DC | PRN

## 2018-06-03 MED ORDER — COLLAGENASE 250 UNIT/GM EX OINT
1.0000 "application " | TOPICAL_OINTMENT | Freq: Every day | CUTANEOUS | Status: DC
  Administered 2018-06-03 – 2018-06-13 (×9): 1 via TOPICAL
  Filled 2018-06-03 (×2): qty 30

## 2018-06-03 MED ORDER — ATORVASTATIN CALCIUM 20 MG PO TABS
40.0000 mg | ORAL_TABLET | Freq: Every day | ORAL | Status: DC
  Administered 2018-06-03 – 2018-06-13 (×10): 40 mg via ORAL
  Filled 2018-06-03 (×11): qty 2

## 2018-06-03 MED ORDER — FERROUS SULFATE 325 (65 FE) MG PO TABS
325.0000 mg | ORAL_TABLET | Freq: Two times a day (BID) | ORAL | Status: DC
  Administered 2018-06-03: 325 mg via ORAL
  Filled 2018-06-03: qty 1

## 2018-06-03 MED ORDER — DOCUSATE SODIUM 100 MG PO CAPS
100.0000 mg | ORAL_CAPSULE | Freq: Two times a day (BID) | ORAL | Status: DC
  Administered 2018-06-03 – 2018-06-13 (×18): 100 mg via ORAL
  Filled 2018-06-03 (×20): qty 1

## 2018-06-03 MED ORDER — INSULIN ASPART 100 UNIT/ML ~~LOC~~ SOLN
0.0000 [IU] | Freq: Three times a day (TID) | SUBCUTANEOUS | Status: DC
  Administered 2018-06-03: 5 [IU] via SUBCUTANEOUS
  Administered 2018-06-03: 9 [IU] via SUBCUTANEOUS
  Administered 2018-06-04: 7 [IU] via SUBCUTANEOUS
  Administered 2018-06-04: 5 [IU] via SUBCUTANEOUS
  Administered 2018-06-04: 18:00:00 7 [IU] via SUBCUTANEOUS
  Administered 2018-06-05 (×2): 3 [IU] via SUBCUTANEOUS
  Administered 2018-06-06 (×2): 2 [IU] via SUBCUTANEOUS
  Administered 2018-06-07: 09:00:00 3 [IU] via SUBCUTANEOUS
  Administered 2018-06-07: 7 [IU] via SUBCUTANEOUS
  Administered 2018-06-07: 1 [IU] via SUBCUTANEOUS
  Administered 2018-06-08: 18:00:00 7 [IU] via SUBCUTANEOUS
  Administered 2018-06-08: 9 [IU] via SUBCUTANEOUS
  Administered 2018-06-09: 2 [IU] via SUBCUTANEOUS
  Administered 2018-06-09 (×2): 5 [IU] via SUBCUTANEOUS
  Administered 2018-06-10: 18:00:00 7 [IU] via SUBCUTANEOUS
  Administered 2018-06-10: 2 [IU] via SUBCUTANEOUS
  Administered 2018-06-10: 5 [IU] via SUBCUTANEOUS
  Administered 2018-06-11: 1 [IU] via SUBCUTANEOUS
  Filled 2018-06-03 (×23): qty 1

## 2018-06-03 MED ORDER — SODIUM CHLORIDE 0.9 % IV BOLUS (SEPSIS)
250.0000 mL | Freq: Once | INTRAVENOUS | Status: AC
  Administered 2018-06-03: 250 mL via INTRAVENOUS

## 2018-06-03 MED ORDER — ONDANSETRON HCL 4 MG/2ML IJ SOLN
4.0000 mg | Freq: Four times a day (QID) | INTRAMUSCULAR | Status: DC | PRN
  Administered 2018-06-03: 4 mg via INTRAVENOUS
  Filled 2018-06-03: qty 2

## 2018-06-03 MED ORDER — BUDESONIDE 0.5 MG/2ML IN SUSP
0.5000 mg | Freq: Two times a day (BID) | RESPIRATORY_TRACT | Status: DC
  Administered 2018-06-03 – 2018-06-12 (×18): 0.5 mg via RESPIRATORY_TRACT
  Filled 2018-06-03 (×21): qty 2

## 2018-06-03 MED ORDER — SODIUM CHLORIDE 0.9 % IV SOLN
2.0000 g | Freq: Once | INTRAVENOUS | Status: AC
  Administered 2018-06-03: 2 g via INTRAVENOUS
  Filled 2018-06-03: qty 2

## 2018-06-03 MED ORDER — INSULIN ASPART 100 UNIT/ML ~~LOC~~ SOLN
10.0000 [IU] | Freq: Once | SUBCUTANEOUS | Status: AC
  Administered 2018-06-03: 10 [IU] via SUBCUTANEOUS

## 2018-06-03 MED ORDER — SODIUM CHLORIDE 0.9 % IV SOLN
100.0000 mg | Freq: Two times a day (BID) | INTRAVENOUS | Status: DC
  Administered 2018-06-03: 100 mg via INTRAVENOUS
  Filled 2018-06-03 (×2): qty 100

## 2018-06-03 MED ORDER — ASPIRIN 81 MG PO CHEW
81.0000 mg | CHEWABLE_TABLET | Freq: Every day | ORAL | Status: DC
  Administered 2018-06-03 – 2018-06-13 (×11): 81 mg via ORAL
  Filled 2018-06-03 (×11): qty 1

## 2018-06-03 MED ORDER — INSULIN ASPART 100 UNIT/ML ~~LOC~~ SOLN
0.0000 [IU] | Freq: Every day | SUBCUTANEOUS | Status: DC
  Administered 2018-06-04 – 2018-06-07 (×2): 2 [IU] via SUBCUTANEOUS
  Administered 2018-06-08: 22:00:00 3 [IU] via SUBCUTANEOUS
  Filled 2018-06-03 (×3): qty 1

## 2018-06-03 MED ORDER — INSULIN DETEMIR 100 UNIT/ML ~~LOC~~ SOLN
10.0000 [IU] | Freq: Every day | SUBCUTANEOUS | Status: DC
  Filled 2018-06-03: qty 0.1

## 2018-06-03 MED ORDER — INSULIN ASPART 100 UNIT/ML ~~LOC~~ SOLN
3.0000 [IU] | Freq: Three times a day (TID) | SUBCUTANEOUS | Status: DC
  Administered 2018-06-03 – 2018-06-09 (×8): 3 [IU] via SUBCUTANEOUS
  Filled 2018-06-03 (×11): qty 1

## 2018-06-03 MED ORDER — RIVAROXABAN 10 MG PO TABS
10.0000 mg | ORAL_TABLET | Freq: Every evening | ORAL | Status: DC
  Administered 2018-06-03 – 2018-06-13 (×10): 10 mg via ORAL
  Filled 2018-06-03 (×13): qty 1

## 2018-06-03 MED ORDER — SODIUM CHLORIDE 0.9 % IV SOLN
500.0000 mg | INTRAVENOUS | Status: DC
  Filled 2018-06-03: qty 500

## 2018-06-03 MED ORDER — ONDANSETRON HCL 4 MG PO TABS: 4.0000 mg | ORAL_TABLET | Freq: Four times a day (QID) | ORAL | Status: DC | PRN

## 2018-06-03 MED ORDER — INSULIN DETEMIR 100 UNIT/ML ~~LOC~~ SOLN
15.0000 [IU] | Freq: Every day | SUBCUTANEOUS | Status: DC
  Administered 2018-06-03 – 2018-06-04 (×2): 15 [IU] via SUBCUTANEOUS
  Filled 2018-06-03 (×2): qty 0.15

## 2018-06-03 MED ORDER — METHYLPREDNISOLONE SODIUM SUCC 40 MG IJ SOLR
40.0000 mg | Freq: Two times a day (BID) | INTRAMUSCULAR | Status: DC
  Administered 2018-06-03 – 2018-06-05 (×4): 40 mg via INTRAVENOUS
  Filled 2018-06-03 (×6): qty 1

## 2018-06-03 MED ORDER — HYDROCODONE-ACETAMINOPHEN 5-325 MG PO TABS
1.0000 | ORAL_TABLET | Freq: Four times a day (QID) | ORAL | Status: DC | PRN
  Administered 2018-06-03: 1 via ORAL
  Administered 2018-06-03: 2 via ORAL
  Administered 2018-06-03: 1 via ORAL
  Administered 2018-06-04 – 2018-06-07 (×6): 2 via ORAL
  Administered 2018-06-08 – 2018-06-10 (×6): 1 via ORAL
  Administered 2018-06-10: 22:00:00 2 via ORAL
  Administered 2018-06-11: 1 via ORAL
  Administered 2018-06-12 – 2018-06-13 (×3): 2 via ORAL
  Filled 2018-06-03 (×2): qty 1
  Filled 2018-06-03 (×3): qty 2
  Filled 2018-06-03 (×3): qty 1
  Filled 2018-06-03: qty 2
  Filled 2018-06-03: qty 1
  Filled 2018-06-03: qty 2
  Filled 2018-06-03 (×3): qty 1
  Filled 2018-06-03 (×3): qty 2
  Filled 2018-06-03 (×2): qty 1
  Filled 2018-06-03 (×5): qty 2

## 2018-06-03 MED ORDER — METHYLPREDNISOLONE SODIUM SUCC 125 MG IJ SOLR: 60.0000 mg | INTRAMUSCULAR | Status: DC

## 2018-06-03 MED ORDER — ALBUTEROL SULFATE (2.5 MG/3ML) 0.083% IN NEBU
2.5000 mg | INHALATION_SOLUTION | Freq: Four times a day (QID) | RESPIRATORY_TRACT | Status: DC | PRN
  Administered 2018-06-05: 2.5 mg via RESPIRATORY_TRACT
  Filled 2018-06-03: qty 3

## 2018-06-03 MED ORDER — SODIUM CHLORIDE 0.9 % IV SOLN
INTRAVENOUS | Status: DC
  Administered 2018-06-03 – 2018-06-04 (×3): via INTRAVENOUS

## 2018-06-03 MED ORDER — SODIUM CHLORIDE 0.9 % IV BOLUS (SEPSIS)
1000.0000 mL | Freq: Once | INTRAVENOUS | Status: AC
  Administered 2018-06-03: 1000 mL via INTRAVENOUS

## 2018-06-03 MED ORDER — OCUVITE-LUTEIN PO CAPS
1.0000 | ORAL_CAPSULE | Freq: Every day | ORAL | Status: DC
  Administered 2018-06-03 – 2018-06-13 (×11): 1 via ORAL
  Filled 2018-06-03 (×12): qty 1

## 2018-06-03 MED ORDER — ACETAMINOPHEN 325 MG PO TABS: 650.0000 mg | ORAL_TABLET | Freq: Four times a day (QID) | ORAL | Status: DC | PRN

## 2018-06-03 NOTE — Progress Notes (Addendum)
[facility] [place] [facility]      PATIENT NAME: April [name]    MR#:  [id number]  DATE OF BIRTH:  03-20-1956  SUBJECTIVE:   Patient presented to the hospital due to shortness of breath and noted to be in COPD exacerbation.  Initially was on BiPAP but now weaned off of it.  Patient's significant other is at bedside.  Patient asking for some coffee.  REVIEW OF SYSTEMS:    Review of Systems  Constitutional: Negative for chills and fever.  HENT: Negative for congestion and tinnitus.   Eyes: Negative for blurred vision and double vision.  Respiratory: Positive for cough, shortness of breath and wheezing.   Cardiovascular: Negative for chest pain, orthopnea and PND.  Gastrointestinal: Negative for abdominal pain, diarrhea, nausea and vomiting.  Genitourinary: Negative for dysuria and hematuria.  Neurological: Negative for dizziness, sensory change and focal weakness.  All other systems reviewed and are negative.   Nutrition: Carb modified Tolerating Diet: yes Tolerating PT: Await Eval.   DRUG ALLERGIES:   Allergies  Allergen Reactions  . Eggs Or Egg-Derived Products Diarrhea, Itching and Nausea And Vomiting    VITALS:  Blood pressure 114/76, pulse (!) 101, temperature 97.6 F (36.4 C), temperature source Oral, resp. rate (!) 25, height 5\' 2"  (1.575 m), weight 38.1 kg, SpO2 100 %.  PHYSICAL EXAMINATION:   Physical Exam  GENERAL:  62 y.o.-year-old patient lying in bed in no acute distress.  EYES: Pupils equal, round, reactive to light and accommodation. No scleral icterus. Extraocular muscles intact.  HEENT: Head atraumatic, normocephalic. Oropharynx and nasopharynx clear.  NECK:  Supple, no jugular venous distention. No thyroid enlargement, no tenderness.  LUNGS: Good air entry bilaterally, diffuse end expiratory wheezing and rhonchi bilaterally, no rales.  Negative use of accessory muscles. CARDIOVASCULAR: S1, S2 normal. No murmurs, rubs, or  gallops.  ABDOMEN: Soft, nontender, nondistended. Bowel sounds present. No organomegaly or mass.  EXTREMITIES: No cyanosis, clubbing or edema b/l.  Bilateral foot dressings in place from recent bilateral foot surgery. NEUROLOGIC: Cranial nerves II through XII are intact. No focal Motor or sensory deficits b/l.  Globally weak PSYCHIATRIC: The patient is alert and oriented x 3.  SKIN: No obvious rash, lesion, or ulcer.    LABORATORY PANEL:   CBC Recent Labs  Lab 06/03/18 0345  WBC 10.1  HGB 8.3*  HCT 27.1*  PLT 346   ------------------------------------------------------------------------------------------------------------------  Chemistries  Recent Labs  Lab 06/03/18 0345  NA 138  K 4.5  CL 101  CO2 25  GLUCOSE 480*  BUN 47*  CREATININE 1.11*  CALCIUM 8.3*  AST 51*  ALT 56*  ALKPHOS 246*  BILITOT 0.6   ------------------------------------------------------------------------------------------------------------------  Cardiac Enzymes Recent Labs  Lab 06/03/18 1017  TROPONINI 0.36*   ------------------------------------------------------------------------------------------------------------------  RADIOLOGY:  Dg Chest Port 1 View  Result Date: 06/03/2018 CLINICAL DATA:  Shortness of breath.  Code sepsis.  History of COPD. EXAM: PORTABLE CHEST 1 VIEW COMPARISON:  Chest radiograph May 28, 2018 FINDINGS: Cardiomediastinal silhouette is normal. Calcified aortic arch. Patchy bibasilar airspace opacities with small pleural effusions. Similar interstitial prominence. No pneumothorax. Osteopenia. IMPRESSION: Bibasilar consolidation with small pleural effusions. Chronic interstitial changes/COPD. Electronically Signed   By: Awilda Metro M.D.   On: 06/03/2018 02:48   Dg Foot Complete Right  Result Date: 06/03/2018 CLINICAL DATA:  Right foot ulceration. EXAM: RIGHT FOOT COMPLETE - 3+ VIEW COMPARISON:  MRI of May 24, 2018. Radiographs of May 22, 2018.  FINDINGS: There is  interval development of defect involving distal first metatarsal which appears to be most likely represent surgical amputation, but osteomyelitis cannot be excluded. There is noted irregularity involving the proximal base of the first proximal phalanx suggesting osteomyelitis. 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 Vascular calcifications are noted. IMPRESSION: Interval development of defect involving distal first metatarsal which appears to most likely represent surgical removal, but osteomyelitis cannot be excluded. Irregularity is seen involving proximal base of first proximal phalanx suggesting osteomyelitis. Electronically Signed   By: Lupita RaiderJames  Green Jr, M.D.   On: 06/03/2018 08:30   [place] Abdomen Limited Ruq  Result Date: 06/03/2018 CLINICAL DATA:  Elevated liver enzymes EXAM: ULTRASOUND ABDOMEN LIMITED RIGHT UPPER QUADRANT COMPARISON:  CT abdomen pelvis 03/17/2013 FINDINGS: Gallbladder: Cholecystectomy Common bile duct: Diameter: 4 mm Liver: Mildly increased echogenicity of the liver. No focal liver lesion. Portal vein is patent on color Doppler imaging with normal direction of blood flow towards the liver. Significant right pleural effusion IMPRESSION: Postop cholecystectomy without biliary dilatation Mild increased echogenicity of the  liver Significant right pleural effusion. Electronically Signed   By: Marlan Palauharles  Clark M.D.   On: 06/03/2018 10:26     ASSESSMENT AND PLAN:   62 year old female with past medical history of peripheral artery disease, previous DVT, diabetes, COPD, anxiety, recent admission for bilateral lower extremity wounds status post angiogram and stent placements of the lower extremities bilaterally, osteomyelitis and deep abscess to the right foot with removal of sesamoid bones and further podiatric surgical intervention who presents back to the hospital due to shortness of breath and respiratory distress.  1.   Acute on chronic respiratory failure with hypoxia-secondary to COPD exacerbation. - This is secondary to ongoing tobacco abuse.  Continue IV steroids, scheduled duo nebs, Pulmicort nebs.  Patient has been weaned off the BiPAP.  2.  COPD exacerbation-secondary to ongoing tobacco abuse.  Patient's chest x-ray is negative for any acute pneumonia. -Continue IV steroids, scheduled duo nebs, Pulmicort nebs. - Continue empiric doxycycline.  3.  Urinary tract infection-based off a urinalysis on admission.  Continue IV ceftriaxone, follow urine cultures.  4.  Peripheral vascular disease status post recent lower extremity stents with osteomyelitis of the right foot with further debridement and sesamoid bone removal with partial amputation- continue aspirin, statin. -Continue local wound care.  5.  Diabetes type 2 with neuropathy-continue Levemir, NovoLog with meals with sliding scale insulin coverage.  6.  History of previous DVT-continue Xarelto.  7.  GERD-continue Protonix.     All the records are reviewed and case discussed with Care Management/Social Worker. Management plans discussed with the patient, family and they are in agreement.  CODE STATUS: Full code  DVT Prophylaxis: Xarelto  TOTAL Critical Care TIME TAKING CARE OF THIS PATIENT: 30 minutes.   POSSIBLE D/C IN 2-3 DAYS, DEPENDING ON CLINICAL CONDITION.   Houston SirenSAINANI,Paetyn Pietrzak J M.D on 06/03/2018 at 3:41 PM  Between 7am to 6pm - Pager - [phone]  After 6pm go to [email or web address] - Social research officer, governmentpassword EPAS [facility]  [facility] [address] [facility]  Office  [phone]  CC: Primary care physician; Mickel FuchsWroth, Thomas H, MD

## 2018-06-03 NOTE — Progress Notes (Signed)
Transported pt to ICU on Bipap without incident. Pt remains on Bipap and tol well at this time. Report given to ICU RT. 

## 2018-06-03 NOTE — Progress Notes (Signed)
eLink Physician-Brief Progress Note Patient Name: April KillingsDoris J [name] DOB: [birth year] MRN: [id number]   Date of Service  06/03/2018  HPI/Events of Note  62 yo female recently D/Ced from hospital. Now admitted with cystitis/sepsis and respiratory failure on BiPAP. H&P not complete yet. PCCM asked to assume care in ICU. VSS.   eICU Interventions  No new orders.     Intervention Category Evaluation Type: New Patient Evaluation  Lenell AntuSommer,Steven Eugene 06/03/2018, 6:31 AM

## 2018-06-03 NOTE — H&P (Addendum)
April [name] is an 62 y.o. female.   Chief Complaint: Shortness of breath HPI: Patient with past medical history of COPD, osteomyelitis and diabetes presents to the emergency department via EMS due to shortness of breath.  The patient was discharged from the hospital yesterday but had just filled her discharge medications when she was unable to speak in full sentences without fatigue in respiratory distress.  She was started on BiPAP in the emergency department and given Solu-Medrol prior to the emergency department staff calling the hospitalist service for admission.  Past Medical History:  Diagnosis Date  . Anxiety   . Colon polyps   . COPD (chronic obstructive pulmonary disease) (HCC)   . Diabetes mellitus without complication (HCC)   . DVT (deep venous thrombosis) (HCC)   . DVT (deep venous thrombosis) (HCC)   . History of colonic polyps   . Hyperlipidemia   . Peripheral artery disease (HCC)   . Pulmonary nodules/lesions, multiple 08/2012   NEGATIVE PET SCAN  . Retroperitoneal abscess (HCC)    history of in 04/2013  . Tobacco abuse     Past Surgical History:  Procedure Laterality Date  . CHOLECYSTECTOMY    . COLONOSCOPY  05/2007  . IRRIGATION AND DEBRIDEMENT FOOT Bilateral 05/25/2018   Procedure: IRRIGATION AND DEBRIDEMENT FOOT application wound vac left  foot;  Surgeon: Recardo Evangelist, DPM;  Location: [facility] ORS;  Service: Podiatry;  Laterality: Bilateral;  . LOWER EXTREMITY ANGIOGRAPHY Left 05/26/2018   Procedure: Lower Extremity Angiography with possible intervention;  Surgeon: Annice Needy, MD;  Location: [facility] INVASIVE CV LAB;  Service: Cardiovascular;  Laterality: Left;  . LOWER EXTREMITY ANGIOGRAPHY Right 05/27/2018   Procedure: Lower Extremity Angiography;  Surgeon: Renford Dills, MD;  Location: [facility] INVASIVE CV LAB;  Service: Cardiovascular;  Laterality: Right;    Family History  Problem Relation Age of Onset  . Brain cancer Mother    Social History:  reports  that she has been smoking cigarettes. She has a 70.50 pack-year smoking history. She has never used smokeless tobacco. She reports that she does not drink alcohol or use drugs.  Allergies:  Allergies  Allergen Reactions  . Eggs Or Egg-Derived Products Diarrhea, Itching and Nausea And Vomiting    Medications Prior to Admission  Medication Sig Dispense Refill  . albuterol (PROVENTIL HFA;VENTOLIN HFA) 108 (90 Base) MCG/ACT inhaler Inhale 2 puffs into the lungs every 6 (six) hours as needed for wheezing or shortness of breath.    Marland Kitchen albuterol (PROVENTIL) (2.5 MG/3ML) 0.083% nebulizer solution Inhale 2.5 mg into the lungs every 6 (six) hours as needed for wheezing or shortness of breath.     Marland Kitchen aspirin 81 MG chewable tablet Chew 81 mg by mouth daily.    Marland Kitchen atorvastatin (LIPITOR) 40 MG tablet Take 40 mg by mouth daily.    Marland Kitchen doxycycline (VIBRA-TABS) 100 MG tablet Take 1 tablet (100 mg total) by mouth every 12 (twelve) hours for 7 days. 14 tablet 0  . ferrous sulfate 325 (65 FE) MG tablet Take 1 tablet (325 mg total) by mouth 2 (two) times daily with a meal. 60 tablet 0  . Fluticasone-Salmeterol (ADVAIR DISKUS) 250-50 MCG/DOSE AEPB Inhale 1 puff into the lungs 2 (two) times daily. 1 each 0  . guaiFENesin-dextromethorphan (ROBITUSSIN DM) 100-10 MG/5ML syrup Take 5 mLs by mouth every 4 (four) hours as needed for cough. 118 mL 0  . HYDROcodone-acetaminophen (NORCO/VICODIN) 5-325 MG tablet Take 1-2 tablets by mouth every 6 (six) hours as needed  for moderate pain or severe pain. 20 tablet 0  . multivitamin-lutein (OCUVITE-LUTEIN) CAPS capsule Take 1 capsule by mouth daily. 30 capsule 0  . pantoprazole (PROTONIX) 40 MG tablet Take 40 mg by mouth 2 (two) times daily.     . rivaroxaban (XARELTO) 10 MG TABS tablet Take 10 mg by mouth every evening.    Marland Kitchen SANTYL ointment Apply 1 application topically daily.     . sodium hypochlorite (DAKIN'S 1/4 STRENGTH) 0.125 % SOLN Apply 1 application topically daily. Use as a  wound cleanser, pat dry, then apply dressings    . SPIRIVA HANDIHALER 18 MCG inhalation capsule Place 18 mcg into inhaler and inhale daily.       Results for orders placed or performed during the hospital encounter of 06/03/18 (from the past 48 hour(s))  Culture, blood (routine x 2)     Status: None (Preliminary result)   Collection Time: 06/03/18  2:19 AM  Result Value Ref Range   Specimen Description BLOOD LEFT ANTECUBITAL    Special Requests      BOTTLES DRAWN AEROBIC AND ANAEROBIC Blood Culture results may not be optimal due to an excessive volume of blood received in culture bottles   Culture      NO GROWTH < 12 HOURS Performed at [facility], [address] [place], [place] [address]    Report Status PENDING   Blood gas, arterial     Status: Abnormal   Collection Time: 06/03/18  2:59 AM  Result Value Ref Range   FIO2 0.40    Delivery systems BILEVEL POSITIVE AIRWAY PRESSURE    Inspiratory PAP 12    Expiratory PAP 5    pH, Arterial 7.39 7.350 - 7.450   pCO2 arterial 43 32.0 - 48.0 mmHg   pO2, Arterial 143 (H) 83.0 - 108.0 mmHg   Bicarbonate 26.0 20.0 - 28.0 mmol/L   Acid-Base Excess 0.8 0.0 - 2.0 mmol/L   O2 Saturation 99.2 %   Patient temperature 37.0    Collection site RIGHT RADIAL    Sample type ARTERIAL DRAW    Allens test (pass/fail) PASS PASS   Mechanical Rate 8     Comment: Performed at [facility], [address] [place], [place] [address]  Culture, blood (routine x 2)     Status: None (Preliminary result)   Collection Time: 06/03/18  3:45 AM  Result Value Ref Range   Specimen Description BLOOD LEFT HAND    Special Requests      BOTTLES DRAWN AEROBIC ONLY Blood Culture results may not be optimal due to an inadequate volume of blood received in culture bottles   Culture      NO GROWTH < 12 HOURS Performed at [facility], [address] [place], [place] [address]    Report Status PENDING   CBC with Differential     Status:  Abnormal   Collection Time: 06/03/18  3:45 AM  Result Value Ref Range   WBC 10.1 4.0 - 10.5 K/uL   RBC 2.78 (L) 3.87 - 5.11 MIL/uL   Hemoglobin 8.3 (L) 12.0 - 15.0 g/dL   HCT 91.4 (L) 78.2 - 95.6 %   MCV 97.5 80.0 - 100.0 fL   MCH 29.9 26.0 - 34.0 pg   MCHC 30.6 30.0 - 36.0 g/dL   RDW 21.3 08.6 - 57.8 %   Platelets 346 150 - 400 K/uL   nRBC 0.2 0.0 - 0.2 %   Neutrophils Relative % 88 %  Neutro Abs 8.9 (H) 1.7 - 7.7 K/uL   Lymphocytes Relative 6 %   Lymphs Abs 0.6 (L) 0.7 - 4.0 K/uL   Monocytes Relative 5 %   Monocytes Absolute 0.5 0.1 - 1.0 K/uL   Eosinophils Relative 0 %   Eosinophils Absolute 0.0 0.0 - 0.5 K/uL   Basophils Relative 0 %   Basophils Absolute 0.0 0.0 - 0.1 K/uL   Immature Granulocytes 1 %   Abs Immature Granulocytes 0.10 (H) 0.00 - 0.07 K/uL    Comment: Performed at [facility], [address] [place], [place] [address]  Comprehensive metabolic panel     Status: Abnormal   Collection Time: 06/03/18  3:45 AM  Result Value Ref Range   Sodium 138 135 - 145 mmol/L   Potassium 4.5 3.5 - 5.1 mmol/L   Chloride 101 98 - 111 mmol/L   CO2 25 22 - 32 mmol/L   Glucose, Bld 480 (H) 70 - 99 mg/dL   BUN 47 (H) 8 - 23 mg/dL   Creatinine, Ser 6.04 (H) 0.44 - 1.00 mg/dL   Calcium 8.3 (L) 8.9 - 10.3 mg/dL   Total Protein 6.5 6.5 - 8.1 g/dL   Albumin 2.2 (L) 3.5 - 5.0 g/dL   AST 51 (H) 15 - 41 U/L   ALT 56 (H) 0 - 44 U/L   Alkaline Phosphatase 246 (H) 38 - 126 U/L   Total Bilirubin 0.6 0.3 - 1.2 mg/dL   GFR calc non Af Amer 53 (L) >60 mL/min   GFR calc Af Amer >60 >60 mL/min   Anion gap 12 5 - 15    Comment: Performed at [facility], [address] [place], [place] [address]  Troponin I - Once     Status: Abnormal   Collection Time: 06/03/18  3:45 AM  Result Value Ref Range   Troponin I 0.50 (HH) <0.03 ng/mL    Comment: CRITICAL RESULT CALLED TO, READ BACK BY AND VERIFIED WITH Dorathy Daft KEEN 06/03/18 0501 KBH Performed at [facility],  [address] [place], [place] [address]   Lactic acid, plasma     Status: Abnormal   Collection Time: 06/03/18  3:45 AM  Result Value Ref Range   Lactic Acid, Venous 4.4 (HH) 0.5 - 1.9 mmol/L    Comment: CRITICAL RESULT CALLED TO, READ BACK BY AND VERIFIED WITH Dorathy Daft KEEN 06/03/18 0501 KBH Performed at [facility], [address] [place], [place] [address]   Urinalysis, Complete w Microscopic     Status: Abnormal   Collection Time: 06/03/18  4:36 AM  Result Value Ref Range   Color, Urine YELLOW (A) YELLOW   APPearance CLOUDY (A) CLEAR   Specific Gravity, Urine 1.017 1.005 - 1.030   pH 5.0 5.0 - 8.0   Glucose, UA >=500 (A) NEGATIVE mg/dL   Hgb urine dipstick MODERATE (A) NEGATIVE   Bilirubin Urine NEGATIVE NEGATIVE   Ketones, ur NEGATIVE NEGATIVE mg/dL   Protein, ur 30 (A) NEGATIVE mg/dL   Nitrite NEGATIVE NEGATIVE   Leukocytes, UA LARGE (A) NEGATIVE   RBC / HPF >50 (H) 0 - 5 RBC/hpf   WBC, UA >50 (H) 0 - 5 WBC/hpf   Bacteria, UA NONE SEEN NONE SEEN   Squamous Epithelial / LPF 0-5 0 - 5   WBC Clumps PRESENT    Budding Yeast PRESENT     Comment: Performed at [facility], [address] [place], [place] [address]  Glucose, capillary  Status: Abnormal   Collection Time: 06/03/18  6:25 AM  Result Value Ref Range   Glucose-Capillary 449 (H) 70 - 99 mg/dL  MRSA PCR Screening     Status: None   Collection Time: 06/03/18  6:26 AM  Result Value Ref Range   MRSA by PCR NEGATIVE NEGATIVE    Comment:        The GeneXpert MRSA Assay (FDA approved for NASAL specimens only), is one component of a comprehensive MRSA colonization surveillance program. It is not intended to diagnose MRSA infection nor to guide or monitor treatment for MRSA infections. Performed at [facility], [address] [place], [place] [address]   Lactic acid, plasma     Status: Abnormal   Collection Time: 06/03/18  6:54 AM  Result Value Ref Range   Lactic Acid,  Venous 2.8 (HH) 0.5 - 1.9 mmol/L    Comment: CRITICAL RESULT CALLED TO, READ BACK BY AND VERIFIED WITH STACIE COLLIN@0723  ON 06/03/18 BY HKP Performed at [facility], [address] [place], [place] [address]   Procalcitonin - Baseline     Status: None   Collection Time: 06/03/18  6:54 AM  Result Value Ref Range   Procalcitonin 0.14 ng/mL    Comment:        Interpretation: PCT (Procalcitonin) <= 0.5 ng/mL: Systemic infection (sepsis) is not likely. Local bacterial infection is possible. (NOTE)       Sepsis PCT Algorithm           Lower Respiratory Tract                                      Infection PCT Algorithm    ----------------------------     ----------------------------         PCT < 0.25 ng/mL                PCT < 0.10 ng/mL         Strongly encourage             Strongly discourage   discontinuation of antibiotics    initiation of antibiotics    ----------------------------     -----------------------------       PCT 0.25 - 0.50 ng/mL            PCT 0.10 - 0.25 ng/mL               OR       >80% decrease in PCT            Discourage initiation of                                            antibiotics      Encourage discontinuation           of antibiotics    ----------------------------     -----------------------------         PCT >= 0.50 ng/mL              PCT 0.26 - 0.50 ng/mL               AND        <80% decrease in PCT             Encourage initiation of  antibiotics       Encourage continuation           of antibiotics    ----------------------------     -----------------------------        PCT >= 0.50 ng/mL                  PCT > 0.50 ng/mL               AND         increase in PCT                  Strongly encourage                                      initiation of antibiotics    Strongly encourage escalation           of antibiotics                                     -----------------------------                                            PCT <= 0.25 ng/mL                                                 OR                                        > 80% decrease in PCT                                     Discontinue / Do not initiate                                             antibiotics Performed at [facility], [address] [place], [place] [address]   Glucose, capillary     Status: Abnormal   Collection Time: 06/03/18  7:24 AM  Result Value Ref Range   Glucose-Capillary 438 (H) 70 - 99 mg/dL   Dg Chest Port 1 View  Result Date: 06/03/2018 CLINICAL DATA:  Shortness of breath.  Code sepsis.  History of COPD. EXAM: PORTABLE CHEST 1 VIEW COMPARISON:  Chest radiograph May 28, 2018 FINDINGS: Cardiomediastinal silhouette is normal. Calcified aortic arch. Patchy bibasilar airspace opacities with small pleural effusions. Similar interstitial prominence. No pneumothorax. Osteopenia. IMPRESSION: Bibasilar consolidation with small pleural effusions. Chronic interstitial changes/COPD. Electronically Signed   By: Awilda Metro M.D.   On: 06/03/2018 02:48   Dg Foot Complete Right  Result Date: 06/03/2018 CLINICAL DATA:  Right foot ulceration. EXAM: RIGHT FOOT COMPLETE - 3+ VIEW COMPARISON:  MRI of May 24, 2018. Radiographs of May 22, 2018. FINDINGS: There is interval development of defect involving distal first metatarsal  which appears to be most likely represent surgical amputation, but osteomyelitis cannot be excluded. There is noted irregularity involving the proximal base of the first proximal phalanx suggesting osteomyelitis. 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 Vascular calcifications are noted. IMPRESSION: Interval development of defect involving distal first metatarsal which appears to most likely represent surgical removal, but osteomyelitis cannot be excluded. Irregularity is seen involving proximal base of  first proximal phalanx suggesting osteomyelitis. Electronically Signed   By: Lupita Raider, M.D.   On: 06/03/2018 08:30    Review of Systems  Constitutional: Negative for chills and fever.  HENT: Negative for sore throat and tinnitus.   Eyes: Negative for blurred vision and redness.  Respiratory: Positive for shortness of breath. Negative for cough.   Cardiovascular: Negative for chest pain, palpitations, orthopnea and PND.  Gastrointestinal: Negative for abdominal pain, diarrhea, nausea and vomiting.  Genitourinary: Negative for dysuria, frequency and urgency.  Musculoskeletal: Negative for joint pain and myalgias.  Skin: Negative for rash.       No lesions  Neurological: Negative for speech change, focal weakness and weakness.  Endo/Heme/Allergies: Does not bruise/bleed easily.       No temperature intolerance  Psychiatric/Behavioral: Negative for depression and suicidal ideas.    Blood pressure 122/75, pulse (!) 131, temperature 97.6 F (36.4 C), temperature source Oral, resp. rate (!) 31, height 5\' 2"  (1.575 m), weight 38.1 kg, SpO2 100 %. Physical Exam  Vitals reviewed. Constitutional: She is oriented to person, place, and time. She appears well-developed and well-nourished. No distress.  HENT:  Head: Normocephalic and atraumatic.  Mouth/Throat: Oropharynx is clear and moist.  Eyes: Pupils are equal, round, and reactive to light. Conjunctivae and EOM are normal. No scleral icterus.  Neck: Normal range of motion. Neck supple. No JVD present. No tracheal deviation present. No thyromegaly present.  Cardiovascular: Normal rate, regular rhythm and normal heart sounds. Exam reveals no gallop and no friction rub.  No murmur heard. Respiratory: She is in respiratory distress. She has wheezes.  GI: Soft. Bowel sounds are normal. She exhibits no distension. There is no tenderness.  Genitourinary:  Genitourinary Comments: Deferred  Musculoskeletal: Normal range of motion. She exhibits  no edema.  Lymphadenopathy:    She has no cervical adenopathy.  Neurological: She is alert and oriented to person, place, and time. No cranial nerve deficit. She exhibits normal muscle tone.  Skin: Skin is warm and dry. No rash noted. No erythema.  Psychiatric: She has a normal mood and affect. Her behavior is normal. Judgment and thought content normal.     Assessment/Plan This is a 62 year old female admitted for respiratory failure. 1.  Respiratory failure: Acute on chronic; with hypoxemia.  Patient has long-standing COPD.  To need BiPAP for increased work of breathing.  Titrate oxygen to maintain saturations 88 to 92%.  Continue doxycycline for anti-inflammatory effect.  Taper steroids area scheduled breathing treatments.  Continue inhaled corticosteroid 2.  Sepsis: The patient meets criteria via elevated lactic acid, tachycardia and tachypnea.  She is hemodynamically stable.  Hydrate aggressively with intravenous fluid.  Source is likely urine.  Follow ultras for growth and sensitivities. 3.  UTI: Present on admission; continue cefepime vancomycin.  Narrow coverage once sensitivities and grow cultures return. 4.  Diabetes mellitus type II: Continue basal insulin coverage adjusted for hospital diet.  Sliding scale insulin while hospitalized as well. 5.  Hyperlipidemia: Continue statin therapy 6.  Pressure ulcers: Continue wound care (Santyl and  Dakin's solution) 7.  DVT prophylaxis: Xarelto for known deep vein thrombosis 8.  GI prophylaxis: Pantoprazole per home regimen The patient is a full code.  I have personally spent 45 minutes in critical care time with this patient  Arnaldo Nataliamond,  Sharleen Szczesny S, MD 06/03/2018, 9:16 AM

## 2018-06-03 NOTE — ED Triage Notes (Signed)
Pt arrived from home with complaints of SOB. Pt was called in as a code sepsis. VS per EMS O2sat-77%RA HR-130 BP-83/56. EMS placed a 22 in right hand. Pt was given Duoneb and Albuterol en route. Pt has Hx of COPD and Diabetes. Pt is not on oxygen at home. Pt has a procedure on her feet yesterday due to her diabetes. Pt refused to go to rehab so she was released home on yesterday. Pt alert and oriented x 4. Pain 9/10.

## 2018-06-03 NOTE — Progress Notes (Signed)
Pharmacy Electrolyte Monitoring Consult:  Pharmacy consulted to assist in monitoring and replacing electrolytes in this 62 y.o. female admitted on 06/03/2018 with acute on chronic respiratory [id number].   Labs:  Sodium (mmol/L)  Date Value  06/03/2018 138  10/14/2014 142   Potassium (mmol/L)  Date Value  06/03/2018 4.5  10/14/2014 3.6   Magnesium (mg/dL)  Date Value  16/10/960411/25/2019 2.1  10/14/2014 1.6 (L)   Phosphorus (mg/dL)  Date Value  54/09/811911/25/2019 5.8 (H)  10/14/2014 3.2   Calcium (mg/dL)  Date Value  14/78/295612/08/2017 8.3 (L)   Calcium, Total (mg/dL)  Date Value  21/30/865704/14/2016 7.7 (L)   Albumin (g/dL)  Date Value  84/69/629512/08/2017 2.2 (L)  10/14/2014 2.7 (L)    Assessment/Plan: 1. Electrolytes: no replacement warranted at this time. Will check electrolytes with am labs.   2. Glucose: per diabetes team recommendations will initiate SSI, Levemir 15 units daily, and Novolog 3 units TIDWM. Patient received methylprednisolone 125mg  x 1 at 0230 and continued on methylprednisolone 40mg  IV Q12hr.   Pharmacy will continue to monitor and adjust per consult.   Denisa Enterline L 06/03/2018 5:05 PM

## 2018-06-03 NOTE — ED Provider Notes (Signed)
[facility] Emergency Department Provider Note   ____________________________________________   First MD Initiated Contact with Patient 06/03/18 [phone]     (approximate)  I have reviewed the triage vital signs and the nursing notes.   HISTORY  Chief Complaint Respiratory distress  Level V caveat: Limited by respiratory distress  HPI LABRESHA MELLOR is a 62 y.o. female brought to the ED via EMS from home with a chief complaint of respiratory distress.  Patient was discharged 2 days ago following a 10-day admission for sepsis, PAD and bilateral lower extremity nonhealing ulcers.  Found to have right foot osteomyelitis.  Reportedly she was offered SNF placement but declined and chose to go home.  Returns tonight for respiratory distress.  EMS reports room air saturations in the 60s upon their arrival.  Patient was given a DuoNeb and nonrebreather oxygen.  Arrives to the ED tachypneic, tight and tripoding.  Rest of history is unobtainable secondary to patient's distress.   Past Medical History:  Diagnosis Date  . Anxiety   . Colon polyps   . COPD (chronic obstructive pulmonary disease) (HCC)   . Diabetes mellitus without complication (HCC)   . DVT (deep venous thrombosis) (HCC)   . DVT (deep venous thrombosis) (HCC)   . History of colonic polyps   . Hyperlipidemia   . Peripheral artery disease (HCC)   . Pulmonary nodules/lesions, multiple 08/2012   NEGATIVE PET SCAN  . Retroperitoneal abscess (HCC)    history of in 04/2013  . Tobacco abuse     Patient Active Problem List   Diagnosis Date Noted  . Acute respiratory failure with hypoxia and hypercapnia (HCC)   . Acute osteomyelitis of right foot (HCC) 05/23/2018  . Protein-calorie malnutrition, severe 04/10/2018  . Diabetes (HCC) 04/08/2018  . COPD (chronic obstructive pulmonary disease) (HCC) 04/08/2018  . HLD (hyperlipidemia) 04/08/2018  . Diabetic foot ulcer (HCC) 04/08/2018  . Chronic recurrent  deep vein thrombosis (DVT) of left lower extremity (HCC) 01/16/2017  . Elevated CEA 01/30/2014    Past Surgical History:  Procedure Laterality Date  . CHOLECYSTECTOMY    . COLONOSCOPY  05/2007  . IRRIGATION AND DEBRIDEMENT FOOT Bilateral 05/25/2018   Procedure: IRRIGATION AND DEBRIDEMENT FOOT application wound vac left  foot;  Surgeon: Recardo Evangelist, DPM;  Location: [facility] ORS;  Service: Podiatry;  Laterality: Bilateral;  . LOWER EXTREMITY ANGIOGRAPHY Left 05/26/2018   Procedure: Lower Extremity Angiography with possible intervention;  Surgeon: Annice Needy, MD;  Location: [facility] INVASIVE CV LAB;  Service: Cardiovascular;  Laterality: Left;  . LOWER EXTREMITY ANGIOGRAPHY Right 05/27/2018   Procedure: Lower Extremity Angiography;  Surgeon: Renford Dills, MD;  [facility]: [facility] INVASIVE CV LAB;  Service: Cardiovascular;  Laterality: Right;    Prior to Admission medications   Medication Sig Start Date End Date Taking? Authorizing Provider  albuterol (PROVENTIL HFA;VENTOLIN HFA) 108 (90 Base) MCG/ACT inhaler Inhale 2 puffs into the lungs every 6 (six) hours as needed for wheezing or shortness of breath.   Yes [provider]  albuterol (PROVENTIL) (2.5 MG/3ML) 0.083% nebulizer solution Inhale 2.5 mg into the lungs every 6 (six) hours as needed for wheezing or shortness of breath.    Yes [provider]  aspirin 81 MG chewable tablet Chew 81 mg by mouth daily. 05/30/17  Yes [provider]  atorvastatin (LIPITOR) 40 MG tablet Take 40 mg by mouth daily. 10/24/17 10/24/18 Yes [provider]  doxycycline (VIBRA-TABS) 100 MG tablet Take 1 tablet (100 mg  total) by mouth every 12 (twelve) hours for 7 days. 06/01/18 06/08/18 Yes Pyreddy, Vivien Rota, MD  ferrous sulfate 325 (65 FE) MG tablet Take 1 tablet (325 mg total) by mouth 2 (two) times daily with a meal. 06/01/18 07/01/18 Yes Pyreddy, Pavan, MD  Fluticasone-Salmeterol (ADVAIR DISKUS) 250-50 MCG/DOSE AEPB Inhale 1 puff  into the lungs 2 (two) times daily. 03/14/15 01/16/26 Yes Beers, Charmayne Sheer, PA-C  guaiFENesin-dextromethorphan (ROBITUSSIN DM) 100-10 MG/5ML syrup Take 5 mLs by mouth every 4 (four) hours as needed for cough. 04/11/18  Yes Milagros Loll, MD  HYDROcodone-acetaminophen (NORCO/VICODIN) 5-325 MG tablet Take 1-2 tablets by mouth every 6 (six) hours as needed for moderate pain or severe pain. 06/01/18  Yes Pyreddy, Vivien Rota, MD  multivitamin-lutein (OCUVITE-LUTEIN) CAPS capsule Take 1 capsule by mouth daily. 06/01/18 07/01/18 Yes Pyreddy, Vivien Rota, MD  pantoprazole (PROTONIX) 40 MG tablet Take 40 mg by mouth 2 (two) times daily.  04/25/18  Yes [provider]  rivaroxaban (XARELTO) 10 MG TABS tablet Take 10 mg by mouth every evening.   Yes [provider]  SANTYL ointment Apply 1 application topically daily.  05/19/18  Yes [provider]  sodium hypochlorite (DAKIN'S 1/4 STRENGTH) 0.125 % SOLN Apply 1 application topically daily. Use as a wound cleanser, pat dry, then apply dressings 12/18/17  Yes [provider]  SPIRIVA HANDIHALER 18 MCG inhalation capsule Place 18 mcg into inhaler and inhale daily.  05/19/18  Yes [provider]    Allergies Eggs or egg-derived products  Family History  Problem Relation Age of Onset  . Brain cancer Mother     Social History Social History   Tobacco Use  . Smoking status: Current Every Day Smoker    Packs/day: 1.50    Years: 47.00    Pack years: 70.50    Types: Cigarettes  . Smokeless tobacco: Never Used  . Tobacco comment: Smoking History 2.5-started at age 63  Substance Use Topics  . Alcohol use: No    Alcohol/week: 0.0 standard drinks  . Drug use: No    Review of Systems  Constitutional: No fever/chills Eyes: No visual changes. ENT: No sore throat. Cardiovascular: Denies chest pain. Respiratory: Positive for shortness of breath. Gastrointestinal: No abdominal pain.  No nausea, no vomiting.  No diarrhea.   No constipation. Genitourinary: Negative for dysuria. Musculoskeletal: Negative for back pain. Skin: Negative for rash. Neurological: Negative for headaches, focal weakness or numbness.   ____________________________________________   PHYSICAL EXAM:  VITAL SIGNS: ED Triage Vitals  Enc Vitals Group     BP      Pulse      Resp      Temp      Temp src      SpO2      Weight      Height      Head Circumference      Peak Flow      Pain Score      Pain Loc      Pain Edu?      Excl. in GC?     Constitutional: Alert and oriented. Ill- appearing and in moderate acute distress. Eyes: Conjunctivae are normal. PERRL. EOMI. Head: Atraumatic. Nose: No congestion/rhinnorhea. Mouth/Throat: Mucous membranes are moist.  Oropharynx non-erythematous. Neck: No stridor.   Cardiovascular: Normal rate, regular rhythm. Grossly normal heart sounds.  Good peripheral circulation. Respiratory: Increased respiratory effort.  Retractions. Lungs diminished bibasilarly with wheezing. Gastrointestinal: Soft and nontender. No distention. No abdominal bruits. No CVA tenderness. Musculoskeletal:  BLE feet wrapped in bandages. Neurologic:  Normal speech and language. No gross focal neurologic deficits are appreciated.  Skin:  Skin is warm, dry and intact. No rash noted. Psychiatric: Mood and affect are normal. Speech and behavior are normal.  ____________________________________________   LABS (all labs ordered are listed, but only abnormal results are displayed)  Labs Reviewed  CBC WITH DIFFERENTIAL/PLATELET - Abnormal; Notable for the following components:      Result Value   RBC 2.78 (*)    Hemoglobin 8.3 (*)    HCT 27.1 (*)    Neutro Abs 8.9 (*)    Lymphs Abs 0.6 (*)    Abs Immature Granulocytes 0.10 (*)    All other components within normal limits  COMPREHENSIVE METABOLIC PANEL - Abnormal; Notable for the following components:   Glucose, Bld 480 (*)    BUN 47 (*)    Creatinine, Ser 1.11  (*)    Calcium 8.3 (*)    Albumin 2.2 (*)    AST 51 (*)    ALT 56 (*)    Alkaline Phosphatase 246 (*)    GFR calc non Af Amer 53 (*)    All other components within normal limits  TROPONIN I - Abnormal; Notable for the following components:   Troponin I 0.50 (*)    All other components within normal limits  LACTIC ACID, PLASMA - Abnormal; Notable for the following components:   Lactic Acid, Venous 4.4 (*)    All other components within normal limits  URINALYSIS, COMPLETE (UACMP) WITH MICROSCOPIC - Abnormal; Notable for the following components:   Color, Urine YELLOW (*)    APPearance CLOUDY (*)    Glucose, UA >=500 (*)    Hgb urine dipstick MODERATE (*)    Protein, ur 30 (*)    Leukocytes, UA LARGE (*)    RBC / HPF >50 (*)    WBC, UA >50 (*)    All other components within normal limits  BLOOD GAS, ARTERIAL - Abnormal; Notable for the following components:   pO2, Arterial 143 (*)    All other components within normal limits  CULTURE, BLOOD (ROUTINE X 2)  CULTURE, BLOOD (ROUTINE X 2)  URINE CULTURE  LACTIC ACID, PLASMA  CBG MONITORING, ED   ____________________________________________  EKG  ED ECG REPORT I, Mikayla Chiusano J, the attending physician, personally viewed and interpreted this ECG.   Date: 06/03/2018  EKG Time: 0219  Rate: 129  Rhythm: Junctional tachycardia  Axis: Normal  Intervals:Prolonged QTC  ST&T Change: Nonspecific QTC prolonged compared to 06/06/2016 ____________________________________________  RADIOLOGY  ED MD interpretation: Bibasilar consolidation with small pleural effusions  Official radiology report(s): Dg Chest Port 1 View  Result Date: 06/03/2018 CLINICAL DATA:  Shortness of breath.  Code sepsis.  History of COPD. EXAM: PORTABLE CHEST 1 VIEW COMPARISON:  Chest radiograph May 28, 2018 FINDINGS: Cardiomediastinal silhouette is normal. Calcified aortic arch. Patchy bibasilar airspace opacities with small pleural effusions. Similar  interstitial prominence. No pneumothorax. Osteopenia. IMPRESSION: Bibasilar consolidation with small pleural effusions. Chronic interstitial changes/COPD. Electronically Signed   By: Awilda Metro M.D.   On: 06/03/2018 02:48    ____________________________________________   PROCEDURES  Procedure(s) performed: None  Procedures  Critical Care performed: Yes, see critical care note(s)   CRITICAL CARE Performed by: Irean Hong   Total critical care time: 45 minutes  Critical care time was exclusive of separately billable procedures and treating other patients.  Critical care was necessary to treat or prevent imminent or life-threatening deterioration.  Critical care was time spent personally by me on the following activities: development of treatment plan with patient and/or surrogate as well as nursing, discussions with consultants, evaluation of patient's response to treatment, examination of patient, obtaining history from patient or surrogate, ordering and performing treatments and interventions, ordering and review of laboratory studies, ordering and review of radiographic studies, pulse oximetry and re-evaluation of patient's condition.  ____________________________________________   INITIAL IMPRESSION / ASSESSMENT AND PLAN / ED COURSE  As part of my medical decision making, I reviewed the following data within the electronic MEDICAL RECORD NUMBER Nursing notes reviewed and incorporated, Labs reviewed, EKG interpreted, Old chart reviewed, Radiograph reviewed, Discussed with admitting physician and Notes from prior ED visits   62 year old female recently discharged following a lengthy admission for sepsis and respiratory failure who returns for respiratory distress. Differential includes, but is not limited to, viral syndrome, bronchitis including COPD exacerbation, pneumonia, reactive airway disease including asthma, CHF including exacerbation with or without  pulmonary/interstitial edema, pneumothorax, ACS, thoracic trauma, and pulmonary embolism.  I personally reviewed patient's chart and see that her recent hospitalization was complicated by respiratory distress requiring BiPAP, IV Solu-Medrol and nebulizer treatments.  BiPAP initiated upon patient's arrival to the ED.  Will administer duo nebs, 125 mg IV Solu-Medrol.  Will discuss with hospitalist to evaluate patient in the emergency department for admission.  Clinical Course as of Jun 03 521  Tue Jun 03, 2018  0520 Also noted hyperglycemia.  Will recheck blood sugar after IV fluids.   [JS]    Clinical Course User Index [JS] Irean HongSung, Nahia Nissan J, MD     ____________________________________________   FINAL CLINICAL IMPRESSION(S) / ED DIAGNOSES  Final diagnoses:  Respiratory distress  Hypoxia  Chronic obstructive pulmonary disease, unspecified COPD type (HCC)  Sepsis, due to unspecified organism, unspecified whether acute organ dysfunction present (HCC)  Elevated troponin  Urinary tract infection without hematuria, site unspecified     ED Discharge Orders    None       Note:  This document was prepared using Dragon voice recognition software and may include unintentional dictation errors.    Irean HongSung, Corrie Brannen J, MD 06/03/18 [phone]

## 2018-06-03 NOTE — ED Notes (Signed)
Called to give report to floor. RN asked to call me back for report

## 2018-06-03 NOTE — Progress Notes (Signed)
Patient declined Bipap at this time. RN aware. Will continue to monitor patient.

## 2018-06-03 NOTE — ED Notes (Signed)
Date and time results received: 06/03/18 0501 (use smartphrase ".now" to insert current time)  Test: Troponin Critical Value: 0.50  Name of Provider Notified: Dr. Dolores FrameSung  Orders Received? Or Actions Taken?:

## 2018-06-03 NOTE — Progress Notes (Addendum)
Inpatient Diabetes Program Recommendations  AACE/ADA: New Consensus Statement on Inpatient Glycemic Control (2019)  Target Ranges:  Prepandial:   less than 140 mg/dL      Peak postprandial:   less than 180 mg/dL (1-2 hours)      Critically ill patients:  140 - 180 mg/dL   Results for April [name], [name] (MRN [id number]) as of 06/03/2018 08:02  Ref. Range 06/03/2018 06:25 06/03/2018 07:24  Glucose-Capillary Latest Ref Range: 70 - 99 mg/dL 132449 (H) 440438 (H)   Results for April [name], April [name] (MRN [id number]) as of 06/03/2018 08:02  Ref. Range 04/09/2018 00:52 05/25/2018 12:53  Hemoglobin A1C Latest Ref Range: 4.8 - 5.6 % 10.3 (H) 10.9 (H)   Review of Glycemic Control  Diabetes history: DM2 Outpatient Diabetes medications: No DM meds on home med list; however at last hospital admission was taking Levemir 10 units QHS, Novolog 7 units TID with meals, Glipizide 5 mg daily Current orders for Inpatient glycemic control: Levemir 10 units daily, Novolog 0-9 units TID with meals, Novolog 0-5 units QHS; Solumedrol 40 mg Q12H  Inpatient Diabetes Program Recommendations:  Insulin - Basal: Please consider increasing Levemir to 15 units daily.  Insulin - Meal Coverage: Please consider ordering Novolog 3 units TID with meals for meal coverage if patient eats at least 50% of meals.  HgbA1C: A1C 10.9% on 05/25/18 indicating an average glucose of 266 mg/dlover the past 2-3 months. Diabetes Coordinator spoke with patient at length on 04/10/18 during prior hospital admission and patient reported she was NOT taking insulin at home. Anticipate patient is still not taking DM medications at home as A1C increased from 10.3 to 10.9%. Patientwill need to take DM medications as prescribed, check glucose at home consistently, and follow up with PCP regarding DM control.  Thanks, Orlando PennerMarie Edynn Gillock, RN, MSN, CDE Diabetes Coordinator Inpatient Diabetes Program [phone] (Team Pager from 8am to 5pm)

## 2018-06-03 NOTE — ED Notes (Signed)
Date and time results received: 06/03/18 0501 (use smartphrase ".now" to insert current time)  Test: Lactic Acid Critical Value: 4.4  Name of Provider Notified: Dr. Dolores FrameSung  Orders Received? Or Actions Taken?:

## 2018-06-03 NOTE — Consult Note (Signed)
Name: April [name] MRN: [id number] DOB: [birth year]    ADMISSION DATE:  06/03/2018 CONSULTATION DATE: 06/03/2018  REFERRING MD : Dr. Marcille Blanco   CHIEF COMPLAINT: Shortness of Breath   BRIEF PATIENT DESCRIPTION:  62 yo female recently discharged following treatment of right foot osteomyelitis admitted with sepsis secondary UTI, hyperglycemia, and acute on chronic hypoxic respiratory failure secondary to AECOPD requiring Bipap   SIGNIFICANT EVENTS  12/3-Pt admitted to the stepdown unit on Bipap   HISTORY OF PRESENT ILLNESS:   This is a  62 yo female with a PMH of Tobacco Abuse, Retroperitoneal Abscess, PAD, Pulmonary Nodules, Hyperlipidemia, Colonic Polyps, DVT, Type II Diabetes Mellitus, COPD, and Anxiety. She presented to [facility] ER on 12/3 via EMS from home with c/o shortness of breath.  Upon EMS arrival pts O2 sats 77% on RA, hr 130, and bp 83/56.  She received duoneb and albuterol treatments en route to the ER and placed on NRB.  In the ER pt tachypneic in a tripod position, therefore she was placed on Bipap.  Lab results revealed glucose 480, BUN 47, creatinine 1.11, alk phos 246, albumin 2.2, AST 51, ALT 56, AST 51, troponin 0.50, lactic acid 4.4, hgb 8.3, and UA positive for UTI.  CXR showed bilateral consolidation and chronic interstitial changes. Code sepsis initiated she received iv cefepime and 1,250 NS bolus.  She was subsequently admitted to the stepdown unit for additional workup and treatment.   Pt recently discharged from [facility] 06/01/18 following a 10 day admission for sepsis, PAD, and bilateral nonhealing ulcers at discharge instructed to complete course of doxycycline. She was found to have right foot osteomyelitis with deep abscess and underwent I&D, excisional debridement, and had removal of sesamoid bones and first metatarsal head from the first metatarsal phalangeal joint region during previous admission.  Physical Therapy recommended PT services in a SNF setting at discharge,  however pt refused and discharged home.   PAST MEDICAL HISTORY :   has a past medical history of Anxiety, Colon polyps, COPD (chronic obstructive pulmonary disease) ([place]), Diabetes mellitus without complication ([place]), DVT (deep venous thrombosis) ([place]), DVT (deep venous thrombosis) ([place]), History of colonic polyps, Hyperlipidemia, Peripheral artery disease ([place]), Pulmonary nodules/lesions, multiple (08/2012), Retroperitoneal abscess ([place]), and Tobacco abuse.  has a past surgical history that includes Colonoscopy (05/2007); Cholecystectomy; Irrigation and debridement foot (Bilateral, 05/25/2018); Lower Extremity Angiography (Left, 05/26/2018); and Lower Extremity Angiography (Right, 05/27/2018). Prior to Admission medications   Medication Sig Start Date End Date Taking? Authorizing Provider  albuterol (PROVENTIL HFA;VENTOLIN HFA) 108 (90 Base) MCG/ACT inhaler Inhale 2 puffs into the lungs every 6 (six) hours as needed for wheezing or shortness of breath.   Yes [provider]  albuterol (PROVENTIL) (2.5 MG/3ML) 0.083% nebulizer solution Inhale 2.5 mg into the lungs every 6 (six) hours as needed for wheezing or shortness of breath.    Yes [provider]  aspirin 81 MG chewable tablet Chew 81 mg by mouth daily. 05/30/17  Yes [provider]  atorvastatin (LIPITOR) 40 MG tablet Take 40 mg by mouth daily. 10/24/17 10/24/18 Yes [provider]  doxycycline (VIBRA-TABS) 100 MG tablet Take 1 tablet (100 mg total) by mouth every 12 (twelve) hours for 7 days. 06/01/18 06/08/18 Yes Pyreddy, Reatha Harps, MD  ferrous sulfate 325 (65 FE) MG tablet Take 1 tablet (325 mg total) by mouth 2 (two) times daily with a meal. 06/01/18 07/01/18 Yes Pyreddy, [place]avan, MD  Fluticasone-Salmeterol (ADVAIR DISKUS) 250-50 MCG/DOSE AEPB Inhale 1 puff into  the lungs 2 (two) times daily. 03/14/15 01/16/26 Yes Beers, Pierce Crane, PA-C  guaiFENesin-dextromethorphan (ROBITUSSIN DM) 100-10 MG/5ML syrup Take 5 mLs by  mouth every 4 (four) hours as needed for cough. 04/11/18  Yes Hillary Bow, MD  HYDROcodone-acetaminophen (NORCO/VICODIN) 5-325 MG tablet Take 1-2 tablets by mouth every 6 (six) hours as needed for moderate pain or severe pain. 06/01/18  Yes Pyreddy, Reatha Harps, MD  multivitamin-lutein (OCUVITE-LUTEIN) CAPS capsule Take 1 capsule by mouth daily. 06/01/18 07/01/18 Yes Pyreddy, Reatha Harps, MD  pantoprazole (PROTONIX) 40 MG tablet Take 40 mg by mouth 2 (two) times daily.  04/25/18  Yes [provider]  rivaroxaban (XARELTO) 10 MG TABS tablet Take 10 mg by mouth every evening.   Yes [provider]  SANTYL ointment Apply 1 application topically daily.  05/19/18  Yes [provider]  sodium hypochlorite (DAKIN'S 1/4 STRENGTH) 0.125 % SOLN Apply 1 application topically daily. Use as a wound cleanser, pat dry, then apply dressings 12/18/17  Yes [provider]  SPIRIVA HANDIHALER 18 MCG inhalation capsule Place 18 mcg into inhaler and inhale daily.  05/19/18  Yes [provider]   Allergies  Allergen Reactions  . Eggs Or Egg-Derived Products Diarrhea, Itching and Nausea And Vomiting    FAMILY HISTORY:  family history includes Brain cancer in her mother. SOCIAL HISTORY:  reports that she has been smoking cigarettes. She has a 70.50 pack-year smoking history. She has never used smokeless tobacco. She reports that she does not drink alcohol or use drugs.  REVIEW OF SYSTEMS: Positives in BOLD  Constitutional: Negative for fever, chills, weight loss, malaise/fatigue and diaphoresis.  HENT: Negative for hearing loss, ear pain, nosebleeds, congestion, sore throat, neck pain, tinnitus and ear discharge.   Eyes: Negative for blurred vision, double vision, photophobia, pain, discharge and redness.  Respiratory: cough, hemoptysis, sputum production, shortness of breath, wheezing and stridor.   Cardiovascular: Negative for chest pain, palpitations, orthopnea, claudication, leg  swelling and PND.  Gastrointestinal: Negative for heartburn, nausea, vomiting, abdominal pain, diarrhea, constipation, blood in stool and melena.  Genitourinary: Negative for dysuria, urgency, frequency, hematuria and flank pain.  Musculoskeletal: bilateral lower extremity pain, myalgias, back pain, joint pain and falls.  Skin: Negative for itching and rash.  Neurological: Negative for dizziness, tingling, tremors, sensory change, speech change, focal weakness, seizures, loss of consciousness, weakness and headaches.  Endo/Heme/Allergies: Negative for environmental allergies and polydipsia. Does not bruise/bleed easily.  SUBJECTIVE:  c/o bilateral lower extremity pain   VITAL SIGNS: Temp:  [97.6 F (36.4 C)] 97.6 F (36.4 C) (12/03 0221) Pulse Rate:  [105-129] 105 (12/03 0330) Resp:  [23-35] 23 (12/03 0600) BP: (113-137)/(70-90) 137/81 (12/03 0600) SpO2:  [91 %-100 %] 99 % (12/03 0330) Weight:  [38.1 kg] 38.1 kg (12/03 0223)  PHYSICAL EXAMINATION: General: acutely ill appearing female, NAD on Bipap  Neuro: alert and oriented, follow commands  HEENT: supple, no JVD  Cardiovascular: sinus tach, no R/G, 1+ palpable BLE popliteal pulses  Lungs: diminished throughout, even, non labored  Abdomen: +BS x4, soft, obese, non tender, non distended  Musculoskeletal: moves all extremities, 2+ bilateral lower extremity edema  Skin: left heel necrotic decubitus ulceration and right foot wound dressings dry and intact, right foot cool to touch and left foot warm    Recent Labs  Lab 05/28/18 0422 05/29/18 0517 06/03/18 0345  NA 140 140 138  K 3.9 4.5 4.5  CL 108 106 101  CO2 _0 BUN 26* 21 47*  CREATININE 0.77 0.86 1.11*  GLUCOSE 149* 250* 480*   Recent Labs  Lab 05/29/18 0517 05/30/18 0425 05/31/18 1340 06/01/18 0332 06/03/18 0345  HGB 9.5* 9.6* 8.6* 8.0* 8.3*  HCT 30.2* 30.8* 27.6* 26.0* 27.1*  WBC 14.6* 14.3*  --   --  10.1  PLT 294 386  --   --  346   Dg Chest Port 1  View  Result Date: 06/03/2018 CLINICAL DATA:  Shortness of breath.  Code sepsis.  History of COPD. EXAM: PORTABLE CHEST 1 VIEW COMPARISON:  Chest radiograph May 28, 2018 FINDINGS: Cardiomediastinal silhouette is normal. Calcified aortic arch. Patchy bibasilar airspace opacities with small pleural effusions. Similar interstitial prominence. No pneumothorax. Osteopenia. IMPRESSION: Bibasilar consolidation with small pleural effusions. Chronic interstitial changes/COPD. Electronically Signed   By: Elon Alas M.D.   On: 06/03/2018 02:48    ASSESSMENT / PLAN:  Acute on chronic hypoxic respiratory failure secondary to AECOPD  Hx: Current Everyday Smoker  Supplemental O2 for dyspnea and/or hypoxia Scheduled and prn bronchodilator therapy IV and nebulized steroids  Smoking cessation counseling provided   Elevated troponin secondary to NSTEMI vs. demand ischemia in setting of respiratory failure Hx: DVT, PAD, and Hyperlipidemia  Continuous telemetry monitoring  Trend troponin's if trending up will consult Cardiology  Continue outpatient xarelto   Acute renal failure  Lactic acidosis  Trend BMP and lactic acid  Replace electrolytes as indicated  Monitor UOP Avoid nephrotoxic medications   Transaminitis and Elevated Alkaline Phosphatase Trend CMP  [place] Abd Limited RUQ  Anemia without obvious signs of bleeding  VTE px: xarelto Trend CBC  Monitor for s/sx of bleeding and transfuse for hgb <8  UTI Bilateral lower extremity nonhealing ulcers  Trend WBC and monitor fever curve  Trend PCT Follow cultures  Continue cefepime and doxycycline  Right foot x-ray pending  Continue santyl to ulcerations   Type II Diabetes Mellitus  CBG's ac/hs  SSI and scheduled levemir  Diabetes coordinator consulted appreciate input   Acute Pain Prn norco pain management   Marda Stalker, [place] Pager [phone] (please enter 7 digits) PCCM Consult Pager [phone]  (please enter 7 digits)

## 2018-06-04 LAB — BASIC METABOLIC PANEL
ANION GAP: 4 — AB (ref 5–15)
BUN: 52 mg/dL — ABNORMAL HIGH (ref 8–23)
CO2: 27 mmol/L (ref 22–32)
Calcium: 7.7 mg/dL — ABNORMAL LOW (ref 8.9–10.3)
Chloride: 107 mmol/L (ref 98–111)
Creatinine, Ser: 0.9 mg/dL (ref 0.44–1.00)
GFR calc Af Amer: >60 mL/min (ref >60)
GFR calc non Af Amer: >60 mL/min (ref >60)
GLUCOSE: 269 mg/dL — AB (ref 70–99)
Potassium: 5.6 mmol/L — ABNORMAL HIGH (ref 3.5–5.1)
Sodium: 138 mmol/L (ref 135–145)

## 2018-06-04 LAB — GLUCOSE, CAPILLARY
Glucose-Capillary: 265 mg/dL — ABNORMAL HIGH (ref 70–99)
Glucose-Capillary: 347 mg/dL — ABNORMAL HIGH (ref 70–99)
Glucose-Capillary: 336 mg/dL — ABNORMAL HIGH (ref 70–99)
Glucose-Capillary: 215 mg/dL — ABNORMAL HIGH (ref 70–99)

## 2018-06-04 LAB — CBC
HCT: 24.2 % — ABNORMAL LOW (ref 36.0–46.0)
Hemoglobin: 7.3 g/dL — ABNORMAL LOW (ref 12.0–15.0)
MCH: 29.9 pg (ref 26.0–34.0)
MCHC: 30.2 g/dL (ref 30.0–36.0)
MCV: 99.2 fL (ref 80.0–100.0)
Platelets: 277 10*3/uL (ref 150–400)
RBC: 2.44 MIL/uL — ABNORMAL LOW (ref 3.87–5.11)
RDW: 13.7 % (ref 11.5–15.5)
WBC: 14.2 10*3/uL — ABNORMAL HIGH (ref 4.0–10.5)
nRBC: 0 % (ref 0.0–0.2)

## 2018-06-04 LAB — POTASSIUM: POTASSIUM: 5.3 mmol/L — AB (ref 3.5–5.1)

## 2018-06-04 LAB — PROCALCITONIN: Procalcitonin: 0.18 ng/mL

## 2018-06-04 MED ORDER — VITAMIN C 500 MG PO TABS
500.0000 mg | ORAL_TABLET | Freq: Two times a day (BID) | ORAL | Status: DC
  Administered 2018-06-04 – 2018-06-13 (×18): 500 mg via ORAL
  Filled 2018-06-04 (×20): qty 1

## 2018-06-04 MED ORDER — INSULIN DETEMIR 100 UNIT/ML ~~LOC~~ SOLN
18.0000 [IU] | Freq: Every day | SUBCUTANEOUS | Status: DC
  Administered 2018-06-05 – 2018-06-06 (×2): 18 [IU] via SUBCUTANEOUS
  Filled 2018-06-04 (×2): qty 0.18

## 2018-06-04 MED ORDER — PREMIER PROTEIN SHAKE
11.0000 [oz_av] | Freq: Two times a day (BID) | ORAL | Status: DC
  Administered 2018-06-04 – 2018-06-13 (×12): 11 [oz_av] via ORAL

## 2018-06-04 MED ORDER — FUROSEMIDE 10 MG/ML IJ SOLN
40.0000 mg | Freq: Once | INTRAMUSCULAR | Status: AC
  Administered 2018-06-04: 40 mg via INTRAVENOUS
  Filled 2018-06-04: qty 4

## 2018-06-04 MED ORDER — SODIUM CHLORIDE 0.9 % IV SOLN
INTRAVENOUS | Status: DC
  Administered 2018-06-04: 21:00:00 via INTRAVENOUS

## 2018-06-04 NOTE — Progress Notes (Signed)
Inpatient Diabetes Program Recommendations  AACE/ADA: New Consensus Statement on Inpatient Glycemic Control (2019)  Target Ranges:  Prepandial:   less than 140 mg/dL      Peak postprandial:   less than 180 mg/dL (1-2 hours)      Critically ill patients:  140 - 180 mg/dL   Results for [name], Daya J (MRN [id number]) as of 06/04/2018 08:11  Ref. Range 06/03/2018 07:24 06/03/2018 11:31 06/03/2018 16:35 06/03/2018 21:36 06/04/2018 08:01  Glucose-Capillary Latest Ref Range: 70 - 99 mg/dL 409438 (H) 811350 (H) 914259 (H) 120 (H) 265 (H)  Results for [name], Chosen J (MRN [id number]) as of 06/04/2018 08:11  Ref. Range 04/09/2018 00:52 05/25/2018 12:53  Hemoglobin A1C Latest Ref Range: 4.8 - 5.6 % 10.3 (H) 10.9 (H)   Review of Glycemic Control  Diabetes history: DM2 Outpatient Diabetes medications: No DM meds on home med list; however at last hospital admission was taking Levemir 10 units QHS, Novolog 7 units TID with meals, Glipizide 5 mg daily Current orders for Inpatient glycemic control: Levemir 15 units daily, Novolog 0-9 units TID with meals, Novolog 0-5 units QHS, Novolog 3 units TID with meals for meal coverage; Solumedrol 40 mg Q12H  Inpatient Diabetes Program Recommendations:  Insulin - Basal: If Solumedrol is continued as ordered, please consider increasing Levemir to 18 units daily.  Insulin - Meal Coverage: If Solumedrol is continued as ordered, please consider increasing meal coverage to Novolog 5 units TID with meals for meal coverage if patient eats at least 50% of meals.  HgbA1C: A1C 10.9% on11/24/19indicating an average glucose of266mg /dlover the past 2-3 months.Diabetes Coordinator spoke with patient at length on 04/10/18 during prior hospital admission and patient reported she wasNOT taking insulin at home.Anticipate patient is still not taking DM medications at home as A1C increased from 10.3 to 10.9%.Patientwill need to take DM medications as prescribed, check glucose at home  consistently, and follow up with PCP regarding DM control.  Thanks, Orlando PennerMarie Mateo Overbeck, RN, MSN, CDE Diabetes Coordinator Inpatient Diabetes Program [phone] (Team Pager from 8am to 5pm)

## 2018-06-04 NOTE — Progress Notes (Addendum)
[facility] [place] [facility]      PATIENT NAME: April [name]    MR#:  [id number]  DATE OF BIRTH:  1955-11-11  SUBJECTIVE:   The patient feels better, off BiPAP and oxygen by nasal cannula. REVIEW OF SYSTEMS:    Review of Systems  Constitutional: Negative for chills and fever.  HENT: Negative for congestion and tinnitus.   Eyes: Negative for blurred vision and double vision.  Respiratory: Positive for cough, shortness of breath and wheezing.   Cardiovascular: Negative for chest pain, orthopnea and PND.  Gastrointestinal: Negative for abdominal pain, diarrhea, nausea and vomiting.  Genitourinary: Negative for dysuria and hematuria.  Neurological: Negative for dizziness, sensory change and focal weakness.  All other systems reviewed and are negative.   Nutrition: Carb modified Tolerating Diet: yes Tolerating PT: Await Eval.   DRUG ALLERGIES:   Allergies  Allergen Reactions  . Eggs Or Egg-Derived Products Diarrhea, Itching and Nausea And Vomiting    VITALS:  Blood pressure (!) 118/99, pulse (!) 102, temperature 98.5 F (36.9 C), resp. rate 20, height 5\' 2"  (1.575 m), weight 51.7 kg, SpO2 92 %.  PHYSICAL EXAMINATION:   Physical Exam  GENERAL:  62 y.o.-year-old patient lying in bed in no acute distress.  EYES: Pupils equal, round, reactive to light and accommodation. No scleral icterus. Extraocular muscles intact.  HEENT: Head atraumatic, normocephalic. Oropharynx and nasopharynx clear.  NECK:  Supple, no jugular venous distention. No thyroid enlargement, no tenderness.  LUNGS: Good air entry bilaterally, diffuse end expiratory wheezing and rhonchi bilaterally, no rales.  Negative use of accessory muscles. CARDIOVASCULAR: S1, S2 normal. No murmurs, rubs, or gallops.  ABDOMEN: Soft, nontender, nondistended. Bowel sounds present. No organomegaly or mass.  EXTREMITIES: No cyanosis, clubbing or edema b/l.  Bilateral foot dressings in place from  recent bilateral foot surgery. NEUROLOGIC: Cranial nerves II through XII are intact. No focal Motor or sensory deficits b/l.  Globally weak PSYCHIATRIC: The patient is alert and oriented x 3.  SKIN: No obvious rash, lesion, or ulcer.    LABORATORY PANEL:   CBC Recent Labs  Lab 06/04/18 0554  WBC 14.2*  HGB 7.3*  HCT 24.2*  PLT 277   ------------------------------------------------------------------------------------------------------------------  Chemistries  Recent Labs  Lab 06/03/18 0345 06/04/18 0554 06/04/18 1451  NA 138 138  --   K 4.5 5.6* 5.3*  CL 101 107  --   CO2 25 27  --   GLUCOSE 480* 269*  --   BUN 47* 52*  --   CREATININE 1.11* 0.90  --   CALCIUM 8.3* 7.7*  --   AST 51*  --   --   ALT 56*  --   --   ALKPHOS 246*  --   --   BILITOT 0.6  --   --    ------------------------------------------------------------------------------------------------------------------  Cardiac Enzymes Recent Labs  Lab 06/03/18 1554  TROPONINI 0.37*   ------------------------------------------------------------------------------------------------------------------  RADIOLOGY:  Dg Chest Port 1 View  Result Date: 06/03/2018 CLINICAL DATA:  Shortness of breath.  Code sepsis.  History of COPD. EXAM: PORTABLE CHEST 1 VIEW COMPARISON:  Chest radiograph May 28, 2018 FINDINGS: Cardiomediastinal silhouette is normal. Calcified aortic arch. Patchy bibasilar airspace opacities with small pleural effusions. Similar interstitial prominence. No pneumothorax. Osteopenia. IMPRESSION: Bibasilar consolidation with small pleural effusions. Chronic interstitial changes/COPD. Electronically Signed   By: Awilda Metroourtnay  Bloomer M.D.   On: 06/03/2018 02:48   Dg Foot Complete Right  Result Date: 06/03/2018 CLINICAL DATA:  Right foot  ulceration. EXAM: RIGHT FOOT COMPLETE - 3+ VIEW COMPARISON:  MRI of May 24, 2018. Radiographs of May 22, 2018. FINDINGS: There is interval development of  defect involving distal first metatarsal which appears to be most likely represent surgical amputation, but osteomyelitis cannot be excluded. There is noted irregularity involving the proximal base of the first proximal phalanx suggesting osteomyelitis. 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 Vascular calcifications are noted. IMPRESSION: Interval development of defect involving distal first metatarsal which appears to most likely represent surgical removal, but osteomyelitis cannot be excluded. Irregularity is seen involving proximal base of first proximal phalanx suggesting osteomyelitis. Electronically Signed   By: Lupita Raider, M.D.   On: 06/03/2018 08:30   US Abdomen Limited Ruq  Result Date: 06/03/2018 CLINICAL DATA:  Elevated liver enzymes EXAM: ULTRASOUND ABDOMEN LIMITED RIGHT UPPER QUADRANT COMPARISON:  CT abdomen pelvis 03/17/2013 FINDINGS: Gallbladder: Cholecystectomy Common bile duct: Diameter: 4 mm Liver: Mildly increased echogenicity of the liver. No focal liver lesion. Portal vein is patent on color Doppler imaging with normal direction of blood flow towards the liver. Significant right pleural effusion IMPRESSION: Postop cholecystectomy without biliary dilatation Mild increased echogenicity of the  liver Significant right pleural effusion. Electronically Signed   By: Marlan Palau M.D.   On: 06/03/2018 10:26     ASSESSMENT AND PLAN:   62 year old female with past medical history of peripheral artery disease, previous DVT, diabetes, COPD, anxiety, recent admission for bilateral lower extremity wounds status post angiogram and stent placements of the lower extremities bilaterally, osteomyelitis and deep abscess to the right foot with removal of sesamoid bones and further podiatric surgical intervention who presents back to the hospital due to shortness of breath and respiratory distress.  1.  Acute on chronic respiratory failure with  hypoxia-secondary to COPD exacerbation. - This is secondary to ongoing tobacco abuse.  Continue IV steroids, scheduled duo nebs, Pulmicort nebs.  off the BiPAP.  2.  COPD exacerbation-secondary to ongoing tobacco abuse.  Patient's chest x-ray is negative for any acute pneumonia. taper IV steroids, scheduled duo nebs, Pulmicort nebs. - Continue empiric doxycycline.  Elevated troponin due to demanding ischemia secondary to above. Troponin level trended down.  Continue Eliquis.  3.  Urinary tract infection-based off a urinalysis on admission.  Continue IV ceftriaxone, follow urine cultures: ENTEROCOCCUS FAECALIS and E Coli.  4.  Peripheral vascular disease status post recent lower extremity stents with osteomyelitis of the right foot with further debridement and sesamoid bone removal with partial amputation- continue aspirin, statin. -Continue local wound care.  5.  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  6.  History of previous DVT-continue Xarelto.  7.  GERD-continue Protonix.  Anemia of chronic disease.  Hemoglobin decreased from 8 to 7.3 possible due to IV fluid dilution.  Follow-up hemoglobin. Dehydration.  IV fluid support and follow-up BMP. Hyperkalemia.  Potassium was 5.6 this morning, repeat 5.3 just now.  Follow-up tomorrow.  Right foot osteomyelitis.  Per x-ray.  Podiatry consult.  Tobacco abuse.  Smoking cessation was counseled for 3 to 4 minutes.  The patient does not want nicotine patch.  All the records are reviewed and case discussed with Care Management/Social Worker. Management plans discussed with the patient, family and they are in agreement.  CODE STATUS: Full code  DVT Prophylaxis: Xarelto  TOTAL Critical Care TIME TAKING CARE OF THIS PATIENT: 35 minutes.  POSSIBLE D/C IN 2 DAYS, DEPENDING ON CLINICAL CONDITION.   Shaune Pollack M.D on 06/04/2018 at 4:36  PM  Between 7am to 6pm - Pager - [phone]  After 6pm go to [email or web address] - Social research officer, government  [facility]  Office  [phone]  CC: Primary care physician; Mickel Fuchs, MD

## 2018-06-04 NOTE — Progress Notes (Signed)
Follow up - Critical Care Medicine Note  Patient Details:    April [name] is an 62 y.o. female.  With ongoing tobacco abuse, retroperitoneal abscess, peripheral vascular disease, pulmonary nodules, hyperlipidemia, DVT, colonic polyps, diabetes, COPD, anxiety, status post vascular stenting and recent amputation secondary to severe peripheral arterial disease presented to the emergency department from home after recent hospitalization with complaints of increasing shortness of breath requiring BiPAP.  Patient was also noted to have a urinary tract infection.  Lines, Airways, Drains: External Urinary Catheter (Active)  Collection Container Dedicated Suction Canister 06/03/2018 11:03 PM  Securement Method Other (Comment) 06/03/2018 11:03 PM  Intervention Equipment Changed 06/03/2018 11:03 PM  Output (mL) 500 mL 06/03/2018 11:03 PM    Anti-infectives:  Anti-infectives (From admission, onward)   Start     Dose/Rate Route Frequency Ordered Stop   06/03/18 1800  cefTRIAXone (ROCEPHIN) 1 g in sodium chloride 0.9 % 100 mL IVPB     1 g 200 mL/hr over 30 Minutes Intravenous Every 24 hours 06/03/18 1016     06/03/18 1800  doxycycline (VIBRA-TABS) tablet 100 mg     100 mg Oral Every 12 hours 06/03/18 1117     06/03/18 0645  doxycycline (VIBRAMYCIN) 100 mg in sodium chloride 0.9 % 250 mL IVPB  Status:  Discontinued     100 mg 125 mL/hr over 120 Minutes Intravenous Every 12 hours 06/03/18 0642 06/03/18 1117   06/03/18 0630  azithromycin (ZITHROMAX) 500 mg in sodium chloride 0.9 % 250 mL IVPB  Status:  Discontinued     500 mg 250 mL/hr over 60 Minutes Intravenous Every 24 hours 06/03/18 0621 06/03/18 0642   06/03/18 0530  ceFEPIme (MAXIPIME) 2 g in sodium chloride 0.9 % 100 mL IVPB     2 g 200 mL/hr over 30 Minutes Intravenous  Once 06/03/18 0519 06/03/18 0900      Microbiology: Results for orders placed or performed during the hospital encounter of 06/03/18  Culture, blood (routine x 2)      Status: None (Preliminary result)   Collection Time: 06/03/18  2:19 AM  Result Value Ref Range Status   Specimen Description BLOOD LEFT ANTECUBITAL  Final   Special Requests   Final    BOTTLES DRAWN AEROBIC AND ANAEROBIC Blood Culture results may not be optimal due to an excessive volume of blood received in culture bottles   Culture   Final    NO GROWTH 1 DAY Performed at [facility], [address], [place], [place] [address]    Report Status PENDING  Incomplete  Culture, blood (routine x 2)     Status: None (Preliminary result)   Collection Time: 06/03/18  3:45 AM  Result Value Ref Range Status   Specimen Description BLOOD LEFT HAND  Final   Special Requests   Final    BOTTLES DRAWN AEROBIC ONLY Blood Culture results may not be optimal due to an inadequate volume of blood received in culture bottles   Culture   Final    NO GROWTH 1 DAY Performed at [facility], [address] [place], [place] [address]    Report Status PENDING  Incomplete  Urine culture     Status: Abnormal (Preliminary result)   Collection Time: 06/03/18  4:36 AM  Result Value Ref Range Status   Specimen Description   Final    URINE, RANDOM Performed at [facility], [address], [place], [place] [address]    Special Requests   Final  NONE Performed at [facility], [address], [place], [place] [address]    Culture (A)  Final    40,000 COLONIES/mL Romie Minus NEGATIVE RODS 70,000 COLONIES/mL UNIDENTIFIED ORGANISM Performed at [facility], [address] [place], [place] [address]    Report Status PENDING  Incomplete  MRSA PCR Screening     Status: None   Collection Time: 06/03/18  6:26 AM  Result Value Ref Range Status   MRSA by PCR NEGATIVE NEGATIVE Final    Comment:        The GeneXpert MRSA Assay (FDA approved for NASAL specimens only), is one component of a comprehensive MRSA colonization surveillance program. It is not intended to diagnose  MRSA infection nor to guide or monitor treatment for MRSA infections. Performed at [facility], [address], [place], [place] [address]    Studies: Mr Foot Right Wo Contrast  Result Date: 05/24/2018 CLINICAL DATA:  Bilateral forefoot pain. History of right toe amputation. Noncompliant diabetic. EXAM: MRI OF THE RIGHT FOREFOOT WITHOUT CONTRAST TECHNIQUE: Multiplanar, multisequence MR imaging of the right forefoot was performed. No intravenous contrast was administered. COMPARISON:  Radiographs 05/22/2018, 04/08/2018 and 05/17/2017. 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 The remaining 5th metatarsal demonstrates no abnormal signal. There is no T2 hyperintensity, abnormal T1 signal or cortical destruction throughout the additional bones of the forefoot. The tibial sesamoid of the 1st metatarsal is bipartite. There are no significant joint effusions or arthropathic changes. Ligaments Intact Lisfranc ligament. Muscles and Tendons Mild T2 hyperintensity throughout the forefoot musculature, likely related to underlying diabetes. No significant tenosynovitis. Soft tissues There is generalized soft tissue edema, especially within the dorsal subcutaneous fat. There are multiple areas of apparent skin ulceration involving the medial forefoot, predominately plantar and medial to the 1st metatarsophalangeal joint. There are inflammatory changes within the adjacent soft tissues, but no focal fluid collection on noncontrast imaging. IMPRESSION: 1. Areas of skin ulceration medial and plantar to the 1st metatarsophalangeal joint without evidence of focal abscess. 2. Generalized soft tissue edema may relate to cellulitis. 3. No evidence of osteomyelitis or septic joint. 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 Electronically Signed   By: Carey Bullocks M.D.   On: 05/24/2018 08:10   Mr Foot Left Wo Contrast  Result Date: 05/24/2018 CLINICAL  DATA:  Bilateral forefoot pain. History of right toe amputation. Noncompliant diabetic. EXAM: MRI OF THE LEFT FOOT WITHOUT CONTRAST TECHNIQUE: Multiplanar, multisequence MR imaging of the left forefoot was performed. No intravenous contrast was administered. COMPARISON:  None. FINDINGS: Bones/Joint/Cartilage There is mild T2 hyperintensity within the marrow of the distal 3rd phalanx. There is no abnormal T1 signal or cortical destruction. The visualized bones are otherwise normal. There are no significant joint effusions or arthropathic changes. Ligaments The Lisfranc ligament is intact. Muscles and Tendons There is mild generalized T2 hyperintensity throughout the forefoot musculature. There is a small amount fluid within the flexor tendon sheaths. Soft tissues Generalized soft tissue edema, especially within the dorsal subcutaneous fat. No focal fluid collection or skin ulceration identified. IMPRESSION: 1. Generalized soft tissue edema may relate to cellulitis. No focal fluid collection or skin ulceration identified. 2. Nonspecific T2 hyperintensity within the distal phalanx of the 3rd toe. This may be secondary to hyperemia. There is no cortical destruction or abnormal T1 signal to confirm osteomyelitis. No adjacent focal soft tissue abnormality or joint effusion. Electronically Signed   By: Carey Bullocks M.D.   On: 05/24/2018 08:04  Dg Chest Port 1 View  Result Date: 06/03/2018 CLINICAL DATA:  Shortness of breath.  Code sepsis.  History of COPD. EXAM: PORTABLE CHEST 1 VIEW COMPARISON:  Chest radiograph May 28, 2018 FINDINGS: Cardiomediastinal silhouette is normal. Calcified aortic arch. Patchy bibasilar airspace opacities with small pleural effusions. Similar interstitial prominence. No pneumothorax. Osteopenia. IMPRESSION: Bibasilar consolidation with small pleural effusions. Chronic interstitial changes/COPD. Electronically Signed   By: Awilda Metro M.D.   On: 06/03/2018 02:48   Dg Chest  Port 1 View  Result Date: 05/28/2018 CLINICAL DATA:  Follow-up atelectatic changes EXAM: PORTABLE CHEST 1 VIEW COMPARISON:  05/26/2018 FINDINGS: Cardiac shadow is stable. Aortic calcifications are again noted. Bilateral pleural effusions are seen right greater than left. Mild vascular congestion remains with some interstitial edema. No focal confluent infiltrate is noted. IMPRESSION: Bilateral pleural effusions right greater than left. Mild vascular congestion is noted. Electronically Signed   By: Alcide Clever M.D.   On: 05/28/2018 07:22   Dg Chest Port 1 View  Result Date: 05/26/2018 CLINICAL DATA:  Follow-up pneumonia EXAM: PORTABLE CHEST 1 VIEW COMPARISON:  05/24/2018 FINDINGS: Cardiac shadow is stable. Aortic calcifications are again seen. The lungs are well aerated bilaterally with persistent interstitial edema. Small right-sided pleural effusion is now noted. No focal infiltrate is seen. IMPRESSION: Persistent mild CHF with new right-sided pleural effusion. Electronically Signed   By: Alcide Clever M.D.   On: 05/26/2018 08:54   Dg Chest Port 1 View  Result Date: 05/24/2018 CLINICAL DATA:  Shortness of breath.  History of COPD and diabetes. EXAM: PORTABLE CHEST 1 VIEW COMPARISON:  Radiographs 03/14/2015. FINDINGS: 0741 hours. The heart size and mediastinal contours are stable. There is aortic atherosclerosis. Interval diffusely increased interstitial markings, most consistent with pulmonary edema. There is no confluent airspace opacity, pneumothorax or significant pleural effusion. No acute osseous findings are seen. IMPRESSION: Diffuse interstitial thickening most consistent with interstitial pulmonary edema superimposed on obstructive lung disease. Electronically Signed   By: Carey Bullocks M.D.   On: 05/24/2018 08:14   Dg Foot Complete Right  Result Date: 06/03/2018 CLINICAL DATA:  Right foot ulceration. EXAM: RIGHT FOOT COMPLETE - 3+ VIEW COMPARISON:  MRI of May 24, 2018. Radiographs  of May 22, 2018. FINDINGS: There is interval development of defect involving distal first metatarsal which appears to be most likely represent surgical amputation, but osteomyelitis cannot be excluded. There is noted irregularity involving the proximal base of the first proximal phalanx suggesting osteomyelitis. 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 Vascular calcifications are noted. IMPRESSION: Interval development of defect involving distal first metatarsal which appears to most likely represent surgical removal, but osteomyelitis cannot be excluded. Irregularity is seen involving proximal base of first proximal phalanx suggesting osteomyelitis. Electronically Signed   By: Lupita Raider, M.D.   On: 06/03/2018 08:30   Dg Foot Complete Right  Result Date: 05/22/2018 CLINICAL DATA:  Worsening wound to the right foot. 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 Focal cortical scalloping along the lateral fifth metatarsal shaft is again demonstrated without significant change. This is likely postoperative. No definite evidence of bone erosion to suggest osteomyelitis. No evidence of acute fracture or dislocation. No focal bone lesion or bone destruction. Soft tissue defect on the plantar aspect of the foot at the level of the metatarsal heads consistent with ulceration. No underlying bone erosions. Prominent vascular calcifications. IMPRESSION:  Postoperative right fifth amputation at the level of the mid metatarsal. Persistent cortical scalloping without change, likely postoperative. Soft tissue ulceration over the metatarsal heads. No definite radiographic evidence of osteomyelitis. Electronically Signed   By: Burman NievesWilliam  Stevens M.D.   On: 05/22/2018 23:52   [place] Abdomen Limited Ruq  Result Date: 06/03/2018 CLINICAL DATA:  Elevated liver enzymes EXAM: ULTRASOUND  ABDOMEN LIMITED RIGHT UPPER QUADRANT COMPARISON:  CT abdomen pelvis 03/17/2013 FINDINGS: Gallbladder: Cholecystectomy Common bile duct: Diameter: 4 mm Liver: Mildly increased echogenicity of the liver. No focal liver lesion. Portal vein is patent on color Doppler imaging with normal direction of blood flow towards the liver. Significant right pleural effusion IMPRESSION: Postop cholecystectomy without biliary dilatation Mild increased echogenicity of the  liver Significant right pleural effusion. Electronically Signed   By: Marlan Palauharles  Clark M.D.   On: 06/03/2018 10:26    Consults:    Subjective:    Overnight Issues:   Objective:  Vital signs for last 24 hours: Temp:  [96.2 F (35.7 C)-98.1 F (36.7 C)] 96.2 F (35.7 C) (12/04 0200) Pulse Rate:  [86-130] 91 (12/04 0700) Resp:  [10-31] 10 (12/04 0700) BP: (97-134)/(59-96) 112/70 (12/04 0700) SpO2:  [91 %-100 %] 98 % (12/04 0911) FiO2 (%):  [21 %] 21 % (12/03 2005) Weight:  [38 kg] 38 kg (12/04 0500)  Hemodynamic parameters for last 24 hours:    Intake/Output from previous day: 12/03 0701 - 12/04 0700 In: 3014 [I.V.:2201.5; IV Piggyback:812.5] Out: 500 [Urine:500]  Intake/Output this shift: No intake/output data recorded.  Vent settings for last 24 hours: FiO2 (%):  [21 %] 21 %  Physical Exam:  Vital signs: Please see the above listed vital signs HEENT: Trachea midline, no thyromegaly noted, no accessory muscle utilization Cardiovascular: Regular rate and rhythm Pulmonary: Clear to auscultation today.  Significantly improved from yesterday Abdominal: Positive bowel sounds, soft exam Extreme knees: Both legs bandaged, surgical amputations noted Neurologic: Cranial nerves are grossly intact without any focal deficits appreciated  Assessment/Plan:   Respiratory failure.  Significantly improved.  Patient has been weaned off of BiPAP.  Bibasilar consolidation concerning for superimposed pneumonia.  Presently on doxycycline,  Rocephin, albuterol, Atrovent, Solu-Medrol.  Significantly improved today.  Stable for floor transfer from a respiratory point of view  Urinary tract infection.  On appropriate antibiotics  Severe peripheral arterial disease, status post surgical amputations as noted in chart  Hyperglycemia.  On scale coverage  Hyperkalemia.  Will give dose of Lasix and repeat BMP this afternoon  Leukocytosis.  Secondary to infection  Anemia.  Clear evidence of active bleeding.  Will follow closely  Prerenal azotemia.  Has been rehydrated.  Will follow closely  Tora KindredJohn Maksym Pfiffner, DO  April [name] 06/04/2018  *Care during the described time interval was provided by me and/or other providers on the critical care team.  I have reviewed this patient's available data, including medical history, events of note, physical examination and test results as part of my evaluation.

## 2018-06-04 NOTE — Plan of Care (Signed)
Pt transferred today from ICU.  VSS. Denies pain.

## 2018-06-04 NOTE — Progress Notes (Signed)
Loss of IV access. Staff and IV team attempted without success. IVF and  solumedrol due, ABX daily. Potassium 5.3. Hgb 7.3 xarelto due, no signs of bleeding, but need clarification if OK to give.  Notified Dr Imogene Burnhen of the above. Per MD to call ICU nurse to try to insert IV. Ok to give xarelto.

## 2018-06-04 NOTE — Consult Note (Signed)
Reason for Consult: Recent surgery right foot as well as ulceration left heel. Referring Physician: Pauline GoodChen  April [name] is an 62 y.o. female.  HPI: This is a 62 year old female with history of diabetes and severe peripheral vascular disease recently admitted with acute respiratory distress.  She has had recent surgery on her right foot as well as recent revascularizations.  Past Medical History:  Diagnosis Date  . Anxiety   . Colon polyps   . COPD (chronic obstructive pulmonary disease) (HCC)   . Diabetes mellitus without complication (HCC)   . DVT (deep venous thrombosis) (HCC)   . DVT (deep venous thrombosis) (HCC)   . History of colonic polyps   . Hyperlipidemia   . Peripheral artery disease (HCC)   . Pulmonary nodules/lesions, multiple 08/2012   NEGATIVE PET SCAN  . Retroperitoneal abscess (HCC)    history of in 04/2013  . Tobacco abuse     Past Surgical History:  Procedure Laterality Date  . CHOLECYSTECTOMY    . COLONOSCOPY  05/2007  . IRRIGATION AND DEBRIDEMENT FOOT Bilateral 05/25/2018   Procedure: IRRIGATION AND DEBRIDEMENT FOOT application wound vac left  foot;  Surgeon: Recardo Evangelistroxler, Matthew, DPM;  Location: [facility] ORS;  Service: Podiatry;  Laterality: Bilateral;  . LOWER EXTREMITY ANGIOGRAPHY Left 05/26/2018   Procedure: Lower Extremity Angiography with possible intervention;  Surgeon: Annice Needyew, Jason S, MD;  Location: [facility] INVASIVE CV LAB;  Service: Cardiovascular;  Laterality: Left;  . LOWER EXTREMITY ANGIOGRAPHY Right 05/27/2018   Procedure: Lower Extremity Angiography;  Surgeon: Renford DillsSchnier, Gregory G, MD;  [facility];  Service: Cardiovascular;  Laterality: Right;    Family History  Problem Relation Age of Onset  . Brain cancer Mother     Social History:  reports that she has been smoking cigarettes. She has a 70.50 pack-year smoking history. She has never used smokeless tobacco. She reports that she does not drink alcohol or use drugs.  Allergies:   Allergies  Allergen Reactions  . Eggs Or Egg-Derived Products Diarrhea, Itching and Nausea And Vomiting    Medications:  Scheduled: . aspirin  81 mg Oral Daily  . atorvastatin  40 mg Oral Daily  . budesonide (PULMICORT) nebulizer solution  0.5 mg Nebulization BID  . collagenase  1 application Topical Daily  . docusate sodium  100 mg Oral BID  . doxycycline  100 mg Oral Q12H  . insulin aspart  0-5 Units Subcutaneous QHS  . insulin aspart  0-9 Units Subcutaneous TID WC  . insulin aspart  3 Units Subcutaneous TID WC  . [START ON 06/05/2018] insulin detemir  18 Units Subcutaneous Daily  . ipratropium-albuterol  3 mL Nebulization Q6H  . methylPREDNISolone (SOLU-MEDROL) injection  40 mg Intravenous Q12H  . multivitamin-lutein  1 capsule Oral Daily  . pantoprazole  40 mg Oral BID  . protein supplement shake  11 oz Oral BID BM  . rivaroxaban  10 mg Oral QPM  . sodium hypochlorite  1 application Topical Daily  . vitamin C  500 mg Oral BID    Results for orders placed or performed during the hospital encounter of 06/03/18 (from the past 48 hour(s))  Culture, blood (routine x 2)     Status: None (Preliminary result)   Collection Time: 06/03/18  2:19 AM  Result Value Ref Range   Specimen Description BLOOD LEFT ANTECUBITAL    Special Requests      BOTTLES DRAWN AEROBIC AND ANAEROBIC Blood Culture results may not be optimal due to an  excessive volume of blood received in culture bottles   Culture      NO GROWTH 1 DAY Performed at [facility], [address] [place], [place] [address]    Report Status PENDING   Blood gas, arterial     Status: Abnormal   Collection Time: 06/03/18  2:59 AM  Result Value Ref Range   FIO2 0.40    Delivery systems BILEVEL POSITIVE AIRWAY PRESSURE    Inspiratory PAP 12    Expiratory PAP 5    pH, Arterial 7.39 7.350 - 7.450   pCO2 arterial 43 32.0 - 48.0 mmHg   pO2, Arterial 143 (H) 83.0 - 108.0 mmHg   Bicarbonate 26.0 20.0 - 28.0 mmol/L    Acid-Base Excess 0.8 0.0 - 2.0 mmol/L   O2 Saturation 99.2 %   Patient temperature 37.0    Collection site RIGHT RADIAL    Sample type ARTERIAL DRAW    Allens test (pass/fail) PASS PASS   Mechanical Rate 8     Comment: Performed at [facility], [address] [place], [place] [address]  Culture, blood (routine x 2)     Status: None (Preliminary result)   Collection Time: 06/03/18  3:45 AM  Result Value Ref Range   Specimen Description BLOOD LEFT HAND    Special Requests      BOTTLES DRAWN AEROBIC ONLY Blood Culture results may not be optimal due to an inadequate volume of blood received in culture bottles   Culture      NO GROWTH 1 DAY Performed at [facility], [address] [place], [place] [address]    Report Status PENDING   CBC with Differential     Status: Abnormal   Collection Time: 06/03/18  3:45 AM  Result Value Ref Range   WBC 10.1 4.0 - 10.5 K/uL   RBC 2.78 (L) 3.87 - 5.11 MIL/uL   Hemoglobin 8.3 (L) 12.0 - 15.0 g/dL   HCT 21.3 (L) 08.6 - 57.8 %   MCV 97.5 80.0 - 100.0 fL   MCH 29.9 26.0 - 34.0 pg   MCHC 30.6 30.0 - 36.0 g/dL   RDW 46.9 62.9 - 52.8 %   Platelets 346 150 - 400 K/uL   nRBC 0.2 0.0 - 0.2 %   Neutrophils Relative % 88 %   Neutro Abs 8.9 (H) 1.7 - 7.7 K/uL   Lymphocytes Relative 6 %   Lymphs Abs 0.6 (L) 0.7 - 4.0 K/uL   Monocytes Relative 5 %   Monocytes Absolute 0.5 0.1 - 1.0 K/uL   Eosinophils Relative 0 %   Eosinophils Absolute 0.0 0.0 - 0.5 K/uL   Basophils Relative 0 %   Basophils Absolute 0.0 0.0 - 0.1 K/uL   Immature Granulocytes 1 %   Abs Immature Granulocytes 0.10 (H) 0.00 - 0.07 K/uL    Comment: Performed at [facility], [address] [place], [place] [address]  Comprehensive metabolic panel     Status: Abnormal   Collection Time: 06/03/18  3:45 AM  Result Value Ref Range   Sodium 138 135 - 145 mmol/L   Potassium 4.5 3.5 - 5.1 mmol/L   Chloride 101 98 - 111 mmol/L   CO2 25 22 - 32 mmol/L    Glucose, Bld 480 (H) 70 - 99 mg/dL   BUN 47 (H) 8 - 23 mg/dL   Creatinine, Ser 4.01 (H) 0.44 - 1.00 mg/dL   Calcium 8.3 (L) 8.9 - 10.3 mg/dL   Total Protein  6.5 6.5 - 8.1 g/dL   Albumin 2.2 (L) 3.5 - 5.0 g/dL   AST 51 (H) 15 - 41 U/L   ALT 56 (H) 0 - 44 U/L   Alkaline Phosphatase 246 (H) 38 - 126 U/L   Total Bilirubin 0.6 0.3 - 1.2 mg/dL   GFR calc non Af Amer 53 (L) >60 mL/min   GFR calc Af Amer >60 >60 mL/min   Anion gap 12 5 - 15    Comment: Performed at [facility], [address] [place], [place] [address]  Troponin I - Once     Status: Abnormal   Collection Time: 06/03/18  3:45 AM  Result Value Ref Range   Troponin I 0.50 (HH) <0.03 ng/mL    Comment: CRITICAL RESULT CALLED TO, READ BACK BY AND VERIFIED WITH Dorathy Daft KEEN 06/03/18 0501 KBH Performed at [facility], [address] [place], [place] [address]   Lactic acid, plasma     Status: Abnormal   Collection Time: 06/03/18  3:45 AM  Result Value Ref Range   Lactic Acid, Venous 4.4 (HH) 0.5 - 1.9 mmol/L    Comment: CRITICAL RESULT CALLED TO, READ BACK BY AND VERIFIED WITH Dorathy Daft KEEN 06/03/18 0501 KBH Performed at [facility], [address] [place], [place] [address]   Urinalysis, Complete w Microscopic     Status: Abnormal   Collection Time: 06/03/18  4:36 AM  Result Value Ref Range   Color, Urine YELLOW (A) YELLOW   APPearance CLOUDY (A) CLEAR   Specific Gravity, Urine 1.017 1.005 - 1.030   pH 5.0 5.0 - 8.0   Glucose, UA >=500 (A) NEGATIVE mg/dL   Hgb urine dipstick MODERATE (A) NEGATIVE   Bilirubin Urine NEGATIVE NEGATIVE   Ketones, ur NEGATIVE NEGATIVE mg/dL   Protein, ur 30 (A) NEGATIVE mg/dL   Nitrite NEGATIVE NEGATIVE   Leukocytes, UA LARGE (A) NEGATIVE   RBC / HPF >50 (H) 0 - 5 RBC/hpf   WBC, UA >50 (H) 0 - 5 WBC/hpf   Bacteria, UA NONE SEEN NONE SEEN   Squamous Epithelial / LPF 0-5 0 - 5   WBC Clumps PRESENT    Budding Yeast PRESENT     Comment: Performed at [facility], [address], [place], [place] [address]  Urine culture     Status: Abnormal (Preliminary result)   Collection Time: 06/03/18  4:36 AM  Result Value Ref Range   Specimen Description      URINE, RANDOM Performed at [facility], [address], [place], [place] [address]    Special Requests      NONE Performed at [facility], [address] [place], [place] [address]    Culture (A)     40,000 COLONIES/mL ESCHERICHIA COLI 70,000 COLONIES/mL ENTEROCOCCUS FAECALIS    Report Status PENDING   Glucose, capillary     Status: Abnormal   Collection Time: 06/03/18  6:25 AM  Result Value Ref Range   Glucose-Capillary 449 (H) 70 - 99 mg/dL  MRSA PCR Screening     Status: None   Collection Time: 06/03/18  6:26 AM  Result Value Ref Range   MRSA by PCR NEGATIVE NEGATIVE    Comment:        The GeneXpert MRSA Assay (FDA approved for NASAL specimens only), is one component of a comprehensive MRSA colonization surveillance program. It is not intended to diagnose MRSA infection nor to guide or monitor treatment for MRSA infections. Performed at  [facility], [address] [place], [place] [address]   Lactic acid, plasma     Status: Abnormal   Collection Time: 06/03/18  6:54 AM  Result Value Ref Range   Lactic Acid, Venous 2.8 (HH) 0.5 - 1.9 mmol/L    Comment: CRITICAL RESULT CALLED TO, READ BACK BY AND VERIFIED WITH STACIE COLLIN@0723  ON 06/03/18 BY HKP Performed at [facility], [address] [place], [place] [address]   Procalcitonin - Baseline     Status: None   Collection Time: 06/03/18  6:54 AM  Result Value Ref Range   Procalcitonin 0.14 ng/mL    Comment:        Interpretation: PCT (Procalcitonin) <= 0.5 ng/mL: Systemic infection (sepsis) is not likely. Local bacterial infection is possible. (NOTE)       Sepsis PCT Algorithm           Lower Respiratory Tract                                      Infection PCT  Algorithm    ----------------------------     ----------------------------         PCT < 0.25 ng/mL                PCT < 0.10 ng/mL         Strongly encourage             Strongly discourage   discontinuation of antibiotics    initiation of antibiotics    ----------------------------     -----------------------------       PCT 0.25 - 0.50 ng/mL            PCT 0.10 - 0.25 ng/mL               OR       >80% decrease in PCT            Discourage initiation of                                            antibiotics      Encourage discontinuation           of antibiotics    ----------------------------     -----------------------------         PCT >= 0.50 ng/mL              PCT 0.26 - 0.50 ng/mL               AND        <80% decrease in PCT             Encourage initiation of                                             antibiotics       Encourage continuation           of antibiotics    ----------------------------     -----------------------------        PCT >= 0.50 ng/mL                  PCT > 0.50 ng/mL  AND         increase in PCT                  Strongly encourage                                      initiation of antibiotics    Strongly encourage escalation           of antibiotics                                     -----------------------------                                           PCT <= 0.25 ng/mL                                                 OR                                        > 80% decrease in PCT                                     Discontinue / Do not initiate                                             antibiotics Performed at [facility], [address] [place], [place] [address]   Glucose, capillary     Status: Abnormal   Collection Time: 06/03/18  7:24 AM  Result Value Ref Range   Glucose-Capillary 438 (H) 70 - 99 mg/dL  Troponin I - Now Then Q6H     Status: Abnormal   Collection Time: 06/03/18 10:17 AM  Result Value Ref Range    Troponin I 0.36 (HH) <0.03 ng/mL    Comment: CRITICAL VALUE NOTED. VALUE IS CONSISTENT WITH PREVIOUSLY REPORTED/CALLED VALUE/HKP Performed at [facility], [address] [place], [place] [address]   Glucose, capillary     Status: Abnormal   Collection Time: 06/03/18 11:31 AM  Result Value Ref Range   Glucose-Capillary 350 (H) 70 - 99 mg/dL  Troponin I - Now Then Q6H     Status: Abnormal   Collection Time: 06/03/18  3:54 PM  Result Value Ref Range   Troponin I 0.37 (HH) <0.03 ng/mL    Comment: CRITICAL VALUE NOTED. VALUE IS CONSISTENT WITH PREVIOUSLY REPORTED/CALLED VALUE KLW Performed at [facility], [address] [place], [place] [address]   Glucose, capillary     Status: Abnormal   Collection Time: 06/03/18  4:35 PM  Result Value Ref Range   Glucose-Capillary 259 (H) 70 - 99 mg/dL  Glucose, capillary     Status: Abnormal   Collection Time: 06/03/18  9:36 PM  Result Value Ref  Range   Glucose-Capillary 120 (H) 70 - 99 mg/dL   Comment 1 Notify RN   Procalcitonin     Status: None   Collection Time: 06/04/18  5:54 AM  Result Value Ref Range   Procalcitonin 0.18 ng/mL    Comment:        Interpretation: PCT (Procalcitonin) <= 0.5 ng/mL: Systemic infection (sepsis) is not likely. Local bacterial infection is possible. (NOTE)       Sepsis PCT Algorithm           Lower Respiratory Tract                                      Infection PCT Algorithm    ----------------------------     ----------------------------         PCT < 0.25 ng/mL                PCT < 0.10 ng/mL         Strongly encourage             Strongly discourage   discontinuation of antibiotics    initiation of antibiotics    ----------------------------     -----------------------------       PCT 0.25 - 0.50 ng/mL            PCT 0.10 - 0.25 ng/mL               OR       >80% decrease in PCT            Discourage initiation of                                            antibiotics       Encourage discontinuation           of antibiotics    ----------------------------     -----------------------------         PCT >= 0.50 ng/mL              PCT 0.26 - 0.50 ng/mL               AND        <80% decrease in PCT             Encourage initiation of                                             antibiotics       Encourage continuation           of antibiotics    ----------------------------     -----------------------------        PCT >= 0.50 ng/mL                  PCT > 0.50 ng/mL               AND         increase in PCT                  Strongly encourage  initiation of antibiotics    Strongly encourage escalation           of antibiotics                                     -----------------------------                                           PCT <= 0.25 ng/mL                                                 OR                                        > 80% decrease in PCT                                     Discontinue / Do not initiate                                             antibiotics Performed at [facility], [address] [place], [place] [address]   Basic metabolic panel     Status: Abnormal   Collection Time: 06/04/18  5:54 AM  Result Value Ref Range   Sodium 138 135 - 145 mmol/L   Potassium 5.6 (H) 3.5 - 5.1 mmol/L   Chloride 107 98 - 111 mmol/L   CO2 27 22 - 32 mmol/L   Glucose, Bld 269 (H) 70 - 99 mg/dL   BUN 52 (H) 8 - 23 mg/dL   Creatinine, Ser 6.04 0.44 - 1.00 mg/dL   Calcium 7.7 (L) 8.9 - 10.3 mg/dL   GFR calc non Af Amer >60 >60 mL/min   GFR calc Af Amer >60 >60 mL/min   Anion gap 4 (L) 5 - 15    Comment: Performed at [facility], [address] [place], [place] [address]  CBC     Status: Abnormal   Collection Time: 06/04/18  5:54 AM  Result Value Ref Range   WBC 14.2 (H) 4.0 - 10.5 K/uL   RBC 2.44 (L) 3.87 - 5.11 MIL/uL   Hemoglobin 7.3 (L) 12.0 - 15.0 g/dL   HCT 11.9 (L) 14.7 -  46.0 %   MCV 99.2 80.0 - 100.0 fL   MCH 29.9 26.0 - 34.0 pg   MCHC 30.2 30.0 - 36.0 g/dL   RDW 82.9 56.2 - 13.0 %   Platelets 277 150 - 400 K/uL   nRBC 0.0 0.0 - 0.2 %    Comment: Performed at [facility], [address] [place], [place] [address]  Glucose, capillary     Status: Abnormal   Collection Time: 06/04/18  8:01 AM  Result Value Ref Range   Glucose-Capillary 265 (H) 70 - 99 mg/dL   Comment 1 Notify RN   Glucose, capillary  Status: Abnormal   Collection Time: 06/04/18 11:25 AM  Result Value Ref Range   Glucose-Capillary 347 (H) 70 - 99 mg/dL   Comment 1 Notify RN   Potassium     Status: Abnormal   Collection Time: 06/04/18  2:51 PM  Result Value Ref Range   Potassium 5.3 (H) 3.5 - 5.1 mmol/L    Comment: Performed at [facility], [address] [place], [place] [address]  Glucose, capillary     Status: Abnormal   Collection Time: 06/04/18  4:56 PM  Result Value Ref Range   Glucose-Capillary 336 (H) 70 - 99 mg/dL    Dg Chest Port 1 View  Result Date: 06/03/2018 CLINICAL DATA:  Shortness of breath.  Code sepsis.  History of COPD. EXAM: PORTABLE CHEST 1 VIEW COMPARISON:  Chest radiograph May 28, 2018 FINDINGS: Cardiomediastinal silhouette is normal. Calcified aortic arch. Patchy bibasilar airspace opacities with small pleural effusions. Similar interstitial prominence. No pneumothorax. Osteopenia. IMPRESSION: Bibasilar consolidation with small pleural effusions. Chronic interstitial changes/COPD. Electronically Signed   By: Awilda Metro M.D.   On: 06/03/2018 02:48   Dg Foot Complete Right  Result Date: 06/03/2018 CLINICAL DATA:  Right foot ulceration. EXAM: RIGHT FOOT COMPLETE - 3+ VIEW COMPARISON:  MRI of May 24, 2018. Radiographs of May 22, 2018. FINDINGS: There is interval development of defect involving distal first metatarsal which appears to be most likely represent surgical amputation, but osteomyelitis cannot be excluded.  There is noted irregularity involving the proximal base of the first proximal phalanx suggesting osteomyelitis. 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 Vascular calcifications are noted. IMPRESSION: Interval development of defect involving distal first metatarsal which appears to most likely represent surgical removal, but osteomyelitis cannot be excluded. Irregularity is seen involving proximal base of first proximal phalanx suggesting osteomyelitis. Electronically Signed   By: Lupita Raider, M.D.   On: 06/03/2018 08:30   US Abdomen Limited Ruq  Result Date: 06/03/2018 CLINICAL DATA:  Elevated liver enzymes EXAM: ULTRASOUND ABDOMEN LIMITED RIGHT UPPER QUADRANT COMPARISON:  CT abdomen pelvis 03/17/2013 FINDINGS: Gallbladder: Cholecystectomy Common bile duct: Diameter: 4 mm Liver: Mildly increased echogenicity of the liver. No focal liver lesion. Portal vein is patent on color Doppler imaging with normal direction of blood flow towards the liver. Significant right pleural effusion IMPRESSION: Postop cholecystectomy without biliary dilatation Mild increased echogenicity of the  liver Significant right pleural effusion. Electronically Signed   By: Marlan Palau M.D.   On: 06/03/2018 10:26    Review of Systems  Constitutional: Negative for chills and fever.  HENT: Negative.   Eyes: Negative.   Respiratory: Positive for cough and shortness of breath.   Cardiovascular: Negative.   Gastrointestinal: Negative for nausea and vomiting.  Genitourinary: Negative.   Musculoskeletal: Negative.   Skin:       Relates a chronic wound on the left heel.  Recent surgery on the right foot with some continued drainage.  Neurological:       Some paresthesias related to her diabetes.  Endo/Heme/Allergies: Negative.   Psychiatric/Behavioral: Negative.    Blood pressure 118/82, pulse (!) 112, temperature 98 F (36.7 C), temperature source Oral, resp. rate 17,  height 5\' 2"  (1.575 m), weight 51.7 kg, SpO2 92 %. Physical Exam  Cardiovascular:  DP pulses diminished but palpable bilateral.  PT pulse was not palpated on the right and diminished on the left.  Musculoskeletal:  Previous amputation of the right fifth ray.  Recent debridement  of the right first metatarsal.  Neurological:  Some loss of sensation distally in the toes.  Skin:  The skin is thin dry and atrophic.  Some mild drainage from the incision area on the medial aspect of the right foot.  No significant erythema with minimal edema.  Large dry eschar on the posterior aspect of the left heel with full-thickness ulceration measuring approximately 4 cm x 3 cm.  Depth undetermined due to the eschar.  No surrounding cellulitis or sign of infection.    Assessment/Plan: Assessment: 1.  Full-thickness ulceration left heel. 2.  Stable postsurgical changes right first metatarsal. 3.  Peripheral vascular disease. 4.  Diabetes with some associated neuropathy.  Plan: Sterile dressings reapplied bilateral.  Right foot does not appear to be clinically infected and the possible ostium myelitis changes on x-ray are just postsurgical.  Would recommend continued Santyl ointment for the ulceration on the left heel and sterile dressing on the right foot.  Follow-up outpatient with Dr. Orland Jarred as scheduled.  Ricci Barker 06/04/2018, 8:03 PM

## 2018-06-05 LAB — URINE CULTURE: Culture: 40000 — AB

## 2018-06-05 LAB — IRON AND TIBC
IRON: 22 ug/dL — AB (ref 28–170)
Saturation Ratios: 9 % — ABNORMAL LOW (ref 10.4–31.8)
TIBC: 259 ug/dL (ref 250–450)
UIBC: 237 ug/dL

## 2018-06-05 LAB — BASIC METABOLIC PANEL
Anion gap: 7 (ref 5–15)
BUN: 55 mg/dL — ABNORMAL HIGH (ref 8–23)
CO2: 25 mmol/L (ref 22–32)
Calcium: 8.1 mg/dL — ABNORMAL LOW (ref 8.9–10.3)
Chloride: 106 mmol/L (ref 98–111)
Creatinine, Ser: 0.84 mg/dL (ref 0.44–1.00)
GFR calc Af Amer: >60 mL/min (ref >60)
GFR calc non Af Amer: >60 mL/min (ref >60)
GLUCOSE: 91 mg/dL (ref 70–99)
Potassium: 5 mmol/L (ref 3.5–5.1)
Sodium: 138 mmol/L (ref 135–145)

## 2018-06-05 LAB — CBC
HCT: 25 % — ABNORMAL LOW (ref 36.0–46.0)
Hemoglobin: 7.5 g/dL — ABNORMAL LOW (ref 12.0–15.0)
MCH: 30.2 pg (ref 26.0–34.0)
MCHC: 30 g/dL (ref 30.0–36.0)
MCV: 100.8 fL — ABNORMAL HIGH (ref 80.0–100.0)
PLATELETS: 279 10*3/uL (ref 150–400)
RBC: 2.48 MIL/uL — ABNORMAL LOW (ref 3.87–5.11)
RDW: 13.8 % (ref 11.5–15.5)
WBC: 15.7 10*3/uL — ABNORMAL HIGH (ref 4.0–10.5)
nRBC: 0 % (ref 0.0–0.2)

## 2018-06-05 LAB — FERRITIN: Ferritin: 72 ng/mL (ref 11–307)

## 2018-06-05 LAB — LACTIC ACID, PLASMA: Lactic Acid, Venous: 1.6 mmol/L (ref 0.5–1.9)

## 2018-06-05 LAB — PROCALCITONIN: Procalcitonin: 0.12 ng/mL

## 2018-06-05 LAB — GLUCOSE, CAPILLARY
Glucose-Capillary: 93 mg/dL (ref 70–99)
Glucose-Capillary: 240 mg/dL — ABNORMAL HIGH (ref 70–99)
Glucose-Capillary: 208 mg/dL — ABNORMAL HIGH (ref 70–99)
Glucose-Capillary: 102 mg/dL — ABNORMAL HIGH (ref 70–99)

## 2018-06-05 MED ORDER — SODIUM CHLORIDE 0.9 % IV SOLN
INTRAVENOUS | Status: DC
  Administered 2018-06-05: 11:00:00 via INTRAVENOUS

## 2018-06-05 MED ORDER — AMOXICILLIN 500 MG PO CAPS
500.0000 mg | ORAL_CAPSULE | Freq: Three times a day (TID) | ORAL | Status: DC
  Administered 2018-06-05 – 2018-06-13 (×25): 500 mg via ORAL
  Filled 2018-06-05 (×28): qty 1

## 2018-06-05 MED ORDER — PREDNISONE 50 MG PO TABS
50.0000 mg | ORAL_TABLET | Freq: Every day | ORAL | Status: DC
  Administered 2018-06-06 – 2018-06-07 (×2): 50 mg via ORAL
  Filled 2018-06-05 (×2): qty 1

## 2018-06-05 NOTE — Care Management Note (Signed)
Case Management Note  Patient Details  Name: April [name] MRN: [id number] Date of Birth: Jul 30, 1955  Subjective/Objective:  Patient recent discharged from hospital with home health via [facility]. Patient lives with her "partner/ friend" April [name]. Per April Kneehadwick they have wheelchair, walker and bedside commode in the home. April [name] provides transportation for the patient. PCP is [facility]. Patient is somewhat independent with ADL's but April [name] is available to assist. Patient is being followed by podiatry/diabetes coordinator. Has been weaned off acute O2.                Action/Plan:   Expected Discharge Date:                  Expected Discharge Plan:  Home w Home Health Services  In-House Referral:     Discharge planning Services  CM Consult  Post Acute Care Choice:  Home Health, Resumption of Svcs/PTA Provider Choice offered to:     DME Arranged:    DME Agency:     HH Arranged:  RN, PT, Nurse's Aide, Social Work [employer] Agency:  [facility]  Status of Service:     If discussed at [employer] Meetings, dates discussed:    Additional Comments:  Eliyohu Class A Kriss Perleberg, RN 06/05/2018, 9:05 AM

## 2018-06-05 NOTE — Plan of Care (Signed)
  Problem: Education: Goal: Knowledge of General Education information will improve Description Including pain rating scale, medication(s)/side effects and non-pharmacologic comfort measures Outcome: Progressing   Problem: Health Behavior/Discharge Planning: Goal: Ability to manage health-related needs will improve Outcome: Progressing   Problem: Clinical Measurements: Goal: Ability to maintain clinical measurements within normal limits will improve Outcome: Progressing Goal: Respiratory complications will improve Outcome: Progressing Goal: Cardiovascular complication will be avoided Outcome: Progressing   Problem: Activity: Goal: Risk for activity intolerance will decrease Outcome: Progressing   Problem: Nutrition: Goal: Adequate nutrition will be maintained Outcome: Progressing   Problem: Coping: Goal: Level of anxiety will decrease Outcome: Progressing   Problem: Elimination: Goal: Will not experience complications related to bowel motility Outcome: Progressing Goal: Will not experience complications related to urinary retention Outcome: Progressing   Problem: Pain Managment: Goal: General experience of comfort will improve Outcome: Progressing   Problem: Safety: Goal: Ability to remain free from injury will improve Outcome: Progressing   Problem: Spiritual Needs Goal: Ability to function at adequate level Outcome: Progressing

## 2018-06-05 NOTE — Progress Notes (Signed)
[facility] - [place] at [facility]      PATIENT NAME: April [name]    MR#:  [id number]  DATE OF BIRTH:  [birth year]  SUBJECTIVE:   The patient feels better, off BiPAP and oxygen by nasal cannula. REVIEW OF SYSTEMS:    Review of Systems  Constitutional: Negative for chills and fever.  HENT: Negative for congestion and tinnitus.   Eyes: Negative for blurred vision and double vision.  Respiratory: Negative for cough, shortness of breath and wheezing.   Cardiovascular: Negative for chest pain, orthopnea and PND.  Gastrointestinal: Negative for abdominal pain, diarrhea, nausea and vomiting.  Genitourinary: Negative for dysuria and hematuria.  Musculoskeletal: Negative for back pain.  Skin: Negative for rash.  Neurological: Negative for dizziness, sensory change and focal weakness.  Psychiatric/Behavioral: Negative for depression. The patient is not nervous/anxious.   All other systems reviewed and are negative.   Nutrition: Carb modified Tolerating Diet: yes Tolerating PT: Await Eval.   DRUG ALLERGIES:   Allergies  Allergen Reactions  . Eggs Or Egg-Derived Products Diarrhea, Itching and Nausea And Vomiting    VITALS:  Blood pressure (!) 141/91, pulse (!) 103, temperature 97.6 F (36.4 C), temperature source Oral, resp. rate 19, height 5\' 2"  (1.575 m), weight 52 kg, SpO2 97 %.  PHYSICAL EXAMINATION:   Physical Exam  GENERAL:  62 y.o.-year-old patient lying in bed in no acute distress.  EYES: Pupils equal, round, reactive to light and accommodation. No scleral icterus. Extraocular muscles intact.  HEENT: Head atraumatic, normocephalic. Oropharynx and nasopharynx clear.  NECK:  Supple, no jugular venous distention. No thyroid enlargement, no tenderness.  LUNGS: Good air entry bilaterally, mild wheezing and rhonchi bilaterally, no rales.  Negative use of accessory muscles. CARDIOVASCULAR: S1, S2 normal. No murmurs, rubs, or gallops.  ABDOMEN: Soft,  nontender, nondistended. Bowel sounds present. No organomegaly or mass.  EXTREMITIES: No cyanosis, clubbing or edema b/l.  Bilateral foot dressings in place from recent bilateral foot surgery. NEUROLOGIC: Cranial nerves II through XII are intact. No focal Motor or sensory deficits b/l.  Globally weak PSYCHIATRIC: The patient is alert and oriented x 3.  SKIN: No obvious rash, lesion, or ulcer.    LABORATORY PANEL:   CBC Recent Labs  Lab 06/05/18 0443  WBC 15.7*  HGB 7.5*  HCT 25.0*  PLT 279   ------------------------------------------------------------------------------------------------------------------  Chemistries  Recent Labs  Lab 06/03/18 0345  06/05/18 0443  NA 138   < > 138  K 4.5   < > 5.0  CL 101   < > 106  CO2 25   < > 25  GLUCOSE 480*   < > 91  BUN 47*   < > 55*  CREATININE 1.11*   < > 0.84  CALCIUM 8.3*   < > 8.1*  AST 51*  --   --   ALT 56*  --   --   ALKPHOS 246*  --   --   BILITOT 0.6  --   --    < > = values in this interval not displayed.   ------------------------------------------------------------------------------------------------------------------  Cardiac Enzymes Recent Labs  Lab 06/03/18 1554  TROPONINI 0.37*   ------------------------------------------------------------------------------------------------------------------  RADIOLOGY:  No results found.   ASSESSMENT AND PLAN:   62 year old female with past medical history of peripheral artery disease, previous DVT, diabetes, COPD, anxiety, recent admission for bilateral lower extremity wounds status post angiogram and stent placements of the lower extremities bilaterally, osteomyelitis and deep abscess to the right foot with  removal of sesamoid bones and further podiatric surgical intervention who presents back to the hospital due to shortness of breath and respiratory distress.  1.  Acute on chronic respiratory failure with hypoxia-secondary to COPD exacerbation. - This is secondary  to ongoing tobacco abuse.  Continue IV steroids, prn duo nebs, Pulmicort nebs.  off the BiPAP.  2.  COPD exacerbation-secondary to ongoing tobacco abuse.  Patient's chest x-ray is negative for any acute pneumonia. tapered IV steroids, change to prednisone po tomorrow, prn duo nebs, Pulmicort nebs. discontinue doxycycline.  Elevated troponin due to demanding ischemia secondary to above. Troponin level trended down.  Continue Xarelto.  3.  Urinary tract infection-based off a urinalysis on admission.   She is on IV ceftriaxone, follow urine cultures: ENTEROCOCCUS FAECALIS and E Coli. Changed to amoxacillin due to sensitivity.  4.  Peripheral vascular disease status post recent lower extremity stents with osteomyelitis of the right foot with further debridement and sesamoid bone removal with partial amputation- continue aspirin, statin. -Continue local wound care.  No osteomyelitis per Dr. Alberteen Spindleline.  5.  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  6.  History of previous DVT-continue Xarelto.  7.  GERD-continue Protonix.  Anemia of chronic disease.  Hemoglobin decreased from 8 to 7.3 possible due to IV fluid dilution.  Follow-up hemoglobin is stable. Dehydration.  IV fluid support and follow-up BMP. Hyperkalemia.  Improved.  Tobacco abuse.  Smoking cessation was counseled for 3 to 4 minutes.  The patient does not want nicotine patch.   All the records are reviewed and case discussed with Care Management/Social Worker. Management plans discussed with the patient, family and they are in agreement.  CODE STATUS: Full code  DVT Prophylaxis: Xarelto  TOTAL Critical Care TIME TAKING CARE OF THIS PATIENT: 26 minutes.   POSSIBLE D/C IN 1-2 DAYS, DEPENDING ON CLINICAL CONDITION.   Shaune PollackQing Roscoe Witts M.D on 06/05/2018 at 2:30 PM  Between 7am to 6pm - Pager - [phone]  After 6pm go to  [email or web address] - Social research officer, governmentpassword EPAS [email or web address]  [facility] [place] [facility]  Office  [phone]  CC: Primary care physician; Mickel FuchsWroth, Thomas H, MD

## 2018-06-05 NOTE — Progress Notes (Signed)
Inpatient Diabetes Program Recommendations  AACE/ADA: New Consensus Statement on Inpatient Glycemic Control (2019)  Target Ranges:  Prepandial:   less than 140 mg/dL      Peak postprandial:   less than 180 mg/dL (1-2 hours)      Critically ill patients:  140 - 180 mg/dL  Results for April [name], April [name] (MRN [id number]) as of 06/05/2018 08:40  Ref. Range 06/04/2018 08:01 06/04/2018 11:25 06/04/2018 16:56 06/04/2018 20:25 06/05/2018 07:31  Glucose-Capillary Latest Ref Range: 70 - 99 mg/dL 829265 (H) 562347 (H) 130336 (H) 215 (H) 93    Review of Glycemic Control  Diabetes history:DM2 Outpatient Diabetes medications:No DM meds on home med list; however at last hospital admission was taking Levemir 10 units QHS, Novolog 7 units TID with meals, Glipizide 5 mg daily Current orders for Inpatient glycemic control:Levemir18 units daily, Novolog 0-9 units TID with meals, Novolog 0-5 units QHS, Novolog 3 units TID with meals for meal coverage; Solumedrol 40 mg Q12H  Inpatient Diabetes Program Recommendations:   Insulin - Meal Coverage: If Solumedrol is continued as ordered, please consider increasing meal coverage to Novolog 5 units TID with meals for meal coverage if patient eats at least 50% of meals.  HgbA1C: A1C 10.9% on11/24/19indicating an average glucose of266mg /dlover the past 2-3 months.Diabetes Coordinator spoke with patient at length on 04/10/18 duringpriorhospital admission and patient reported she wasNOT taking insulin at home.Anticipate patient is still not taking DM medications at home as A1C increased from 10.3 to 10.9%.Patientwill need to take DM medications as prescribed, check glucose at home consistently, and follow up with PCP regarding DM control.  Thanks, Orlando PennerMarie Mohamed Portlock, RN, MSN, CDE Diabetes Coordinator Inpatient Diabetes Program [phone] (Team Pager from 8am to 5pm)

## 2018-06-05 NOTE — Progress Notes (Signed)
This Clinical research associatewriter assisted pt to transfer from bed to chair.  Pt sob with exertion, O2 sats 76% on RA, HR up to 130's, wheezes. Stopped IVF. 2L O2 per [place] applied, O2 sats up to 92% within few min.  RT given neb treatment. Pt assisted to bsc and back to bed, feels better. Dr Cherlynn KaiserSainani notified of the above. Per MD to d/c IVF.

## 2018-06-06 LAB — GLUCOSE, CAPILLARY
Glucose-Capillary: 103 mg/dL — ABNORMAL HIGH (ref 70–99)
Glucose-Capillary: 156 mg/dL — ABNORMAL HIGH (ref 70–99)
Glucose-Capillary: 155 mg/dL — ABNORMAL HIGH (ref 70–99)
Glucose-Capillary: 112 mg/dL — ABNORMAL HIGH (ref 70–99)

## 2018-06-06 LAB — BASIC METABOLIC PANEL
Anion gap: 4 — ABNORMAL LOW (ref 5–15)
BUN: 49 mg/dL — ABNORMAL HIGH (ref 8–23)
CO2: 27 mmol/L (ref 22–32)
Calcium: 8.1 mg/dL — ABNORMAL LOW (ref 8.9–10.3)
Chloride: 108 mmol/L (ref 98–111)
Creatinine, Ser: 0.84 mg/dL (ref 0.44–1.00)
GFR calc Af Amer: >60 mL/min (ref >60)
Glucose, Bld: 156 mg/dL — ABNORMAL HIGH (ref 70–99)
POTASSIUM: 4.6 mmol/L (ref 3.5–5.1)
Sodium: 139 mmol/L (ref 135–145)

## 2018-06-06 MED ORDER — FUROSEMIDE 20 MG PO TABS
20.0000 mg | ORAL_TABLET | Freq: Once | ORAL | Status: AC
  Administered 2018-06-06: 20 mg via ORAL
  Filled 2018-06-06: qty 1

## 2018-06-06 MED ORDER — INSULIN DETEMIR 100 UNIT/ML ~~LOC~~ SOLN
16.0000 [IU] | Freq: Every day | SUBCUTANEOUS | Status: DC
  Administered 2018-06-07 – 2018-06-08 (×2): 16 [IU] via SUBCUTANEOUS
  Filled 2018-06-06 (×3): qty 0.16

## 2018-06-06 MED ORDER — POLYETHYLENE GLYCOL 3350 17 G PO PACK
17.0000 g | PACK | Freq: Every day | ORAL | Status: DC
  Administered 2018-06-06 – 2018-06-13 (×7): 17 g via ORAL
  Filled 2018-06-06 (×8): qty 1

## 2018-06-06 NOTE — Progress Notes (Addendum)
Inpatient Diabetes Program Recommendations  AACE/ADA: New Consensus Statement on Inpatient Glycemic Control (2019)  Target Ranges:  Prepandial:   less than 140 mg/dL      Peak postprandial:   less than 180 mg/dL (1-2 hours)      Critically ill patients:  140 - 180 mg/dL   Results for April [name], [name] (MRN [id number]) as of 06/06/2018 11:46  Ref. Range 06/05/2018 07:31 06/05/2018 11:57 06/05/2018 16:37 06/05/2018 21:14 06/06/2018 07:43  Glucose-Capillary Latest Ref Range: 70 - 99 mg/dL 93 409240 (H) 811208 (H) 914102 (H) 103 (H)   Review of Glycemic Control  Diabetes history:DM2 Outpatient Diabetes medications:No DM meds on home med list; however at last hospital admission was taking Levemir 10 units QHS, Novolog 7 units TID with meals, Glipizide 5 mg daily Current orders for Inpatient glycemic control:Levemir18units daily, Novolog 0-9 units TID with meals, Novolog 0-5 units QHS, Novolog 3 units TID with meals for meal coverage; Prednisone 50 mg QAM  Inpatient Diabetes Program Recommendations:  Insulin-Basal: Noted steroids decreased from Solumedrol IV to Prednisone and fasting glucose 103 mg/dl today. May want to consider decreasing Levemir slightly to 16 units daily.  Thanks, Orlando PennerMarie Tomer Chalmers, RN, MSN, CDE Diabetes Coordinator Inpatient Diabetes Program [phone] (Team Pager from 8am to 5pm)

## 2018-06-06 NOTE — Plan of Care (Signed)

## 2018-06-06 NOTE — Progress Notes (Signed)
[facility] - [place] [facility]      PATIENT NAME: April [name]    MR#:  [id number]  DATE OF BIRTH:  [birth year]  SUBJECTIVE:   The patient has worsening shortness of breath and wheezing last night, put back on oxygen by nasal cannula 2 L.  Still has shortness of breath and wheezing this morning. REVIEW OF SYSTEMS:    Review of Systems  Constitutional: Negative for chills and fever.  HENT: Negative for congestion and tinnitus.   Eyes: Negative for blurred vision and double vision.  Respiratory: Positive for cough, shortness of breath and wheezing. Negative for hemoptysis and sputum production.   Cardiovascular: Negative for chest pain, orthopnea and PND.  Gastrointestinal: Negative for abdominal pain, diarrhea, nausea and vomiting.  Genitourinary: Negative for dysuria and hematuria.  Musculoskeletal: Negative for back pain.  Skin: Negative for rash.  Neurological: Negative for dizziness, sensory change and focal weakness.  Psychiatric/Behavioral: Negative for depression. The patient is not nervous/anxious.   All other systems reviewed and are negative.   Nutrition: Carb modified Tolerating Diet: yes Tolerating PT: Await Eval.   DRUG ALLERGIES:   Allergies  Allergen Reactions  . Eggs Or Egg-Derived Products Diarrhea, Itching and Nausea And Vomiting    VITALS:  Blood pressure 112/69, pulse 67, temperature 97.9 F (36.6 C), temperature source Oral, resp. rate (!) 22, height 5\' 2"  (1.575 m), weight 52.4 kg, SpO2 100 %.  PHYSICAL EXAMINATION:   Physical Exam  GENERAL:  62 y.o.-year-old patient lying in bed in no acute distress.  EYES: Pupils equal, round, reactive to light and accommodation. No scleral icterus. Extraocular muscles intact.  HEENT: Head atraumatic, normocephalic. Oropharynx and nasopharynx clear.  NECK:  Supple, no jugular venous distention. No thyroid enlargement, no tenderness.  LUNGS: Very diminished breath sound bilaterally, no  wheezing or rhonchi bilaterally, no rales.  Negative use of accessory muscles. CARDIOVASCULAR: S1, S2 normal. No murmurs, rubs, or gallops.  ABDOMEN: Soft, nontender, nondistended. Bowel sounds present. No organomegaly or mass.  EXTREMITIES: No cyanosis, clubbing.  Bilateral leg edema.  Bilateral foot dressings in place from recent bilateral foot surgery. NEUROLOGIC: Cranial nerves II through XII are intact. No focal Motor or sensory deficits b/l.  Globally weak PSYCHIATRIC: The patient is alert and oriented x 3.  SKIN: No obvious rash, lesion, or ulcer.    LABORATORY PANEL:   CBC Recent Labs  Lab 06/05/18 0443  WBC 15.7*  HGB 7.5*  HCT 25.0*  PLT 279   ------------------------------------------------------------------------------------------------------------------  Chemistries  Recent Labs  Lab 06/03/18 0345  06/06/18 0408  NA 138   < > 139  K 4.5   < > 4.6  CL 101   < > 108  CO2 25   < > 27  GLUCOSE 480*   < > 156*  BUN 47*   < > 49*  CREATININE 1.11*   < > 0.84  CALCIUM 8.3*   < > 8.1*  AST 51*  --   --   ALT 56*  --   --   ALKPHOS 246*  --   --   BILITOT 0.6  --   --    < > = values in this interval not displayed.   ------------------------------------------------------------------------------------------------------------------  Cardiac Enzymes Recent Labs  Lab 06/03/18 1554  TROPONINI 0.37*   ------------------------------------------------------------------------------------------------------------------  RADIOLOGY:  No results found.   ASSESSMENT AND PLAN:   62 year old female with past medical history of peripheral artery disease, previous DVT, diabetes, COPD, anxiety, recent  admission for bilateral lower extremity wounds status post angiogram and stent placements of the lower extremities bilaterally, osteomyelitis and deep abscess to the right foot with removal of sesamoid bones and further podiatric surgical intervention who presents back to the  hospital due to shortness of breath and respiratory distress.  1.  Acute on chronic respiratory failure with hypoxia-secondary to COPD exacerbation. - This is secondary to ongoing tobacco abuse.   Discontinued IV steroids, and started prednisone, prn duo nebs, Pulmicort nebs.  off the BiPAP.  2.  COPD exacerbation-secondary to ongoing tobacco abuse.  Patient's chest x-ray is negative for any acute pneumonia. Discontinued IV steroids, changed to prednisone po,  prn duo nebs, Pulmicort nebs. discontinued doxycycline.  Elevated troponin due to demanding ischemia secondary to above. Troponin level trended down.  Continue Xarelto.  3.  Urinary tract infection-based off a urinalysis on admission.   She is on IV ceftriaxone, follow urine cultures: ENTEROCOCCUS FAECALIS and E Coli. Changed to amoxacillin due to sensitivity.  4.  Peripheral vascular disease status post recent lower extremity stents with osteomyelitis of the right foot with further debridement and sesamoid bone removal with partial amputation- continue aspirin, statin. -Continue local wound care.  No osteomyelitis per Dr. Alberteen Spindle.  5.  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  6.  History of previous DVT-continue Xarelto.  7.  GERD-continue Protonix.  Anemia of chronic disease.  Hemoglobin decreased from 8 to 7.3 possible due to IV fluid dilution.  Follow-up hemoglobin is stable. Dehydration.  Improving IV fluid support.  Discontinued IV fluid due to worsening shortness of breath.  Hyperkalemia.  Improved.  Tobacco abuse.  Smoking cessation was counseled for 3 to 4 minutes.  The patient does not want nicotine patch.   All the records are reviewed and case discussed with Care Management/Social Worker. Management plans discussed with the patient, family and they are in agreement.  CODE  STATUS: Full code  DVT Prophylaxis: Xarelto  TOTAL Critical Care TIME TAKING CARE OF THIS PATIENT: 26 minutes.   POSSIBLE D/C IN 1-2 DAYS, DEPENDING ON CLINICAL CONDITION.   Shaune Pollack M.D on 06/06/2018 at 12:37 PM  Between 7am to 6pm - Pager - [phone]  After 6pm go to [email or web address] - Social research officer, government  [facility] [place] [facility]  Office  [phone]  CC: Primary care physician; Mickel Fuchs, MD

## 2018-06-06 NOTE — Care Management Important Message (Signed)
Important Message  Patient Details  Name: April [name] MRN: [id number] Date of Birth: 1955/12/05   Medicare Important Message Given:  Yes    Esmae Donathan A Shiron Whetsel, RN 06/06/2018, 1:18 PM

## 2018-06-07 ENCOUNTER — Encounter: Payer: Self-pay | Admitting: Radiology

## 2018-06-07 ENCOUNTER — Inpatient Hospital Stay: Payer: Medicare HMO

## 2018-06-07 LAB — GLUCOSE, CAPILLARY
Glucose-Capillary: 203 mg/dL — ABNORMAL HIGH (ref 70–99)
Glucose-Capillary: 307 mg/dL — ABNORMAL HIGH (ref 70–99)
Glucose-Capillary: 145 mg/dL — ABNORMAL HIGH (ref 70–99)
Glucose-Capillary: 220 mg/dL — ABNORMAL HIGH (ref 70–99)

## 2018-06-07 MED ORDER — METHYLPREDNISOLONE SODIUM SUCC 40 MG IJ SOLR
40.0000 mg | Freq: Two times a day (BID) | INTRAMUSCULAR | Status: DC
  Administered 2018-06-07 – 2018-06-08 (×2): 40 mg via INTRAVENOUS
  Filled 2018-06-07 (×2): qty 1

## 2018-06-07 MED ORDER — IOPAMIDOL (ISOVUE-300) INJECTION 61%
15.0000 mL | INTRAVENOUS | Status: AC
  Administered 2018-06-07 (×2): 15 mL via ORAL

## 2018-06-07 MED ORDER — PHENOL 1.4 % MT LIQD
1.0000 | OROMUCOSAL | Status: DC | PRN
  Administered 2018-06-08 (×4): 1 via OROMUCOSAL
  Filled 2018-06-07: qty 177

## 2018-06-07 MED ORDER — IOHEXOL 350 MG/ML SOLN
75.0000 mL | Freq: Once | INTRAVENOUS | Status: AC | PRN
  Administered 2018-06-07: 75 mL via INTRAVENOUS

## 2018-06-07 NOTE — Progress Notes (Signed)
Patient ID: April [name], female   DOB: [birth year], 62 y.o.   MRN: [id number]  [employer] [facility] PROGRESS NOTE  April [name] WUJ:[id number] DOB: 1955/12/15 DOA: 06/03/2018 PCP: Mickel Fuchs, MD  HPI/Subjective: Patient states her breathing is not good.  She does feel little bit better since she is came into the hospital.  Still with some cough and wheeze.  Also has a headache.  Does occasionally have some leg pain.  She does not wear oxygen at home.  She has a sore throat.  Complains of some abdominal distention and some constipation.  Objective: Vitals:   06/07/18 1054 06/07/18 1324  BP: 113/71   Pulse: (!) 101   Resp: 16   Temp: 98.2 F (36.8 C)   SpO2: 100% 99%   No intake or output data in the 24 hours ending 06/07/18 1356 Filed Weights   06/05/18 0402 06/06/18 0352 06/07/18 0500  Weight: 52 kg 52.4 kg 50.6 kg    ROS: Review of Systems  Constitutional: Negative for chills and fever.  Eyes: Negative for blurred vision.  Respiratory: Positive for cough, shortness of breath and wheezing.   Cardiovascular: Negative for chest pain.  Gastrointestinal: Positive for abdominal pain and constipation. Negative for diarrhea, nausea and vomiting.  Genitourinary: Negative for dysuria.  Musculoskeletal: Negative for joint pain.  Neurological: Negative for dizziness and headaches.   Exam: Physical Exam  Constitutional: She is oriented to person, place, and time.  HENT:  Nose: No mucosal edema.  Mouth/Throat: No oropharyngeal exudate or posterior oropharyngeal edema.  Eyes: Pupils are equal, round, and reactive to light. Conjunctivae, EOM and lids are normal.  Neck: No JVD present. Carotid bruit is not present. No edema present. No thyroid mass and no thyromegaly present.  Cardiovascular: S1 normal and S2 normal. Exam reveals no gallop.  No murmur heard. Pulses:      Dorsalis pedis pulses are 2+ on the right side, and 2+ on the left side.  Respiratory: No respiratory  distress. She has decreased breath sounds in the right middle field, the right lower field, the left middle field and the left lower field. She has wheezes in the right middle field and the left lower field. She has rhonchi in the right lower field and the left lower field. She has no rales.  GI: Soft. Bowel sounds are normal. She exhibits distension. There is no tenderness.  Musculoskeletal:       Right hip: She exhibits swelling.       Left hip: She exhibits swelling.       Right ankle: She exhibits swelling.       Left ankle: She exhibits swelling.  Lymphadenopathy:    She has no cervical adenopathy.  Neurological: She is alert and oriented to person, place, and time. No cranial nerve deficit.  Skin: Skin is warm. Nails show no clubbing.  Bilateral ankles covered with bandages  Psychiatric: She has a normal mood and affect.      Data Reviewed: Basic Metabolic Panel: Recent Labs  Lab 06/03/18 0345 06/04/18 0554 06/04/18 1451 06/05/18 0443 06/06/18 0408  NA 138 138  --  138 139  K 4.5 5.6* 5.3* 5.0 4.6  CL 101 107  --  106 108  CO2 25 27  --  25 27  GLUCOSE 480* 269*  --  91 156*  BUN 47* 52*  --  55* 49*  CREATININE 1.11* 0.90  --  0.84 0.84  CALCIUM 8.3* 7.7*  --  8.1* 8.1*  Liver Function Tests: Recent Labs  Lab 06/03/18 0345  AST 51*  ALT 56*  ALKPHOS 246*  BILITOT 0.6  PROT 6.5  ALBUMIN 2.2*   CBC: Recent Labs  Lab 06/01/18 0332 06/03/18 0345 06/04/18 0554 06/05/18 0443  WBC  --  10.1 14.2* 15.7*  NEUTROABS  --  8.9*  --   --   HGB 8.0* 8.3* 7.3* 7.5*  HCT 26.0* 27.1* 24.2* 25.0*  MCV  --  97.5 99.2 100.8*  PLT  --  346 277 279   Cardiac Enzymes: Recent Labs  Lab 06/03/18 0345 06/03/18 1017 06/03/18 1554  TROPONINI 0.50* 0.36* 0.37*    CBG: Recent Labs  Lab 06/06/18 1154 06/06/18 1649 06/06/18 2114 06/07/18 0815 06/07/18 1145  GLUCAP 156* 155* 112* 203* 307*    Recent Results (from the past 240 hour(s))  Culture, blood (routine x  2)     Status: None (Preliminary result)   Collection Time: 06/03/18  2:19 AM  Result Value Ref Range Status   Specimen Description BLOOD LEFT ANTECUBITAL  Final   Special Requests   Final    BOTTLES DRAWN AEROBIC AND ANAEROBIC Blood Culture results may not be optimal due to an excessive volume of blood received in culture bottles   Culture   Final    NO GROWTH 4 DAYS Performed at [facility], [address], [place], [place] [address]    Report Status PENDING  Incomplete  Culture, blood (routine x 2)     Status: None (Preliminary result)   Collection Time: 06/03/18  3:45 AM  Result Value Ref Range Status   Specimen Description BLOOD LEFT HAND  Final   Special Requests   Final    BOTTLES DRAWN AEROBIC ONLY Blood Culture results may not be optimal due to an inadequate volume of blood received in culture bottles   Culture   Final    NO GROWTH 4 DAYS Performed at [facility], [address] [place], [place] [address]    Report Status PENDING  Incomplete  Urine culture     Status: Abnormal   Collection Time: 06/03/18  4:36 AM  Result Value Ref Range Status   Specimen Description   Final    URINE, RANDOM Performed at [facility], [address] [place], [place] [address]    Special Requests   Final    NONE Performed at [facility], [address] [place], [place] [address]    Culture (A)  Final    40,000 COLONIES/mL ESCHERICHIA COLI 70,000 COLONIES/mL ENTEROCOCCUS FAECALIS    Report Status 06/05/2018 FINAL  Final   Organism ID, Bacteria ESCHERICHIA COLI (A)  Final   Organism ID, Bacteria ENTEROCOCCUS FAECALIS (A)  Final      Susceptibility   Escherichia coli - MIC*    AMPICILLIN <=2 SENSITIVE Sensitive     CEFAZOLIN <=4 SENSITIVE Sensitive     CEFTRIAXONE <=1 SENSITIVE Sensitive     CIPROFLOXACIN <=0.25 SENSITIVE Sensitive     GENTAMICIN <=1 SENSITIVE Sensitive     IMIPENEM <=0.25 SENSITIVE Sensitive     NITROFURANTOIN <=16  SENSITIVE Sensitive     TRIMETH/SULFA <=20 SENSITIVE Sensitive     AMPICILLIN/SULBACTAM <=2 SENSITIVE Sensitive     PIP/TAZO <=4 SENSITIVE Sensitive     Extended ESBL NEGATIVE Sensitive     * 40,000 COLONIES/mL ESCHERICHIA COLI   Enterococcus faecalis - MIC*    AMPICILLIN <=2 SENSITIVE Sensitive     LEVOFLOXACIN 2 SENSITIVE Sensitive  NITROFURANTOIN <=16 SENSITIVE Sensitive     VANCOMYCIN 1 SENSITIVE Sensitive     * 70,000 COLONIES/mL ENTEROCOCCUS FAECALIS  MRSA PCR Screening     Status: None   Collection Time: 06/03/18  6:26 AM  Result Value Ref Range Status   MRSA by PCR NEGATIVE NEGATIVE Final    Comment:        The GeneXpert MRSA Assay (FDA approved for NASAL specimens only), is one component of a comprehensive MRSA colonization surveillance program. It is not intended to diagnose MRSA infection nor to guide or monitor treatment for MRSA infections. Performed at [facility], [address] [place], [place] [address]      Studies: No results found.  Scheduled Meds: . amoxicillin  500 mg Oral Q8H  . aspirin  81 mg Oral Daily  . atorvastatin  40 mg Oral Daily  . budesonide (PULMICORT) nebulizer solution  0.5 mg Nebulization BID  . collagenase  1 application Topical Daily  . docusate sodium  100 mg Oral BID  . insulin aspart  0-5 Units Subcutaneous QHS  . insulin aspart  0-9 Units Subcutaneous TID WC  . insulin aspart  3 Units Subcutaneous TID WC  . insulin detemir  16 Units Subcutaneous Daily  . iopamidol  15 mL Oral Q1 Hr x 2  . ipratropium-albuterol  3 mL Nebulization Q6H  . multivitamin-lutein  1 capsule Oral Daily  . pantoprazole  40 mg Oral BID  . polyethylene glycol  17 g Oral Daily  . predniSONE  50 mg Oral Q breakfast  . protein supplement shake  11 oz Oral BID BM  . rivaroxaban  10 mg Oral QPM  . vitamin C  500 mg Oral BID   Continuous Infusions:  Assessment/Plan:  1. Acute hypoxic respiratory failure.  The patient states that she  does not wear oxygen at home.  As per nursing staff she desaturates quite quickly. 2. COPD exacerbation.  Patient switched over to prednisone for this morning.  Switch back to Solu-Medrol.  Continue nebulizer treatments. 3. Acute cystitis with enterococcus and E. coli.  Switched over to amoxicillin 4. Abdominal swelling.  We will get a CT scan of the abdomen for further evaluation 5. Multiple pulmonary nodule seen on CT scan back in 2015.  We will get a CT scan of the chest also. 6. Peripheral vascular disease on Xarelto 7. 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 GERD on Protonix 9. Anemia of chronic disease 10. Elevated troponin secondary to demand ischemia  Code Status:     Code Status Orders  (From admission, onward)         Start     Ordered   06/03/18 0622  Full code  Continuous     06/03/18 0621        Code Status History    Date Active Date Inactive Code Status Order ID Comments User Context   05/23/2018 0253 06/01/2018 2158 Full Code [id number]  Cammy CopaMaier, Angela, MD Inpatient   05/23/2018 0138 05/23/2018 0253 DNR [id number]  Cammy CopaMaier, Angela, MD ED   04/10/2018 1111 04/11/2018 1554 DNR [id number]  Milagros LollSudini, Srikar, MD Inpatient   04/09/2018 1519 04/10/2018 1111 Full Code [id number]  Milagros LollSudini, Srikar, MD Inpatient   04/09/2018 1337 04/09/2018 1519 DNR [id number]  Milagros LollSudini, Srikar, MD Inpatient   04/09/2018 0012 04/09/2018 1337 Full Code [id number]  Oralia ManisWillis, David, MD ED     Family Communication: Left message for daughter Disposition Plan: To be determined  Antibiotics:  Amoxicillin  Time spent: 28 minutes  Cristen Murcia [employer]

## 2018-06-07 NOTE — Plan of Care (Signed)
  Problem: Education: Goal: Knowledge of General Education information will improve Description Including pain rating scale, medication(s)/side effects and non-pharmacologic comfort measures Outcome: Progressing   Problem: Health Behavior/Discharge Planning: Goal: Ability to manage health-related needs will improve Outcome: Progressing   Problem: Clinical Measurements: Goal: Ability to maintain clinical measurements within normal limits will improve Outcome: Progressing Goal: Will remain free from infection Outcome: Progressing Goal: Diagnostic test results will improve Outcome: Progressing Goal: Respiratory complications will improve Outcome: Progressing Goal: Cardiovascular complication will be avoided Outcome: Progressing   Problem: Activity: Goal: Risk for activity intolerance will decrease Outcome: Progressing   Problem: Nutrition: Goal: Adequate nutrition will be maintained Outcome: Progressing   Problem: Coping: Goal: Level of anxiety will decrease Outcome: Progressing   Problem: Coping: Goal: Level of anxiety will decrease Outcome: Progressing   Problem: Nutrition: Goal: Adequate nutrition will be maintained Outcome: Progressing   Problem: Elimination: Goal: Will not experience complications related to bowel motility Outcome: Progressing Goal: Will not experience complications related to urinary retention Outcome: Progressing   Problem: Pain Managment: Goal: General experience of comfort will improve Outcome: Progressing   Problem: Safety: Goal: Ability to remain free from injury will improve Outcome: Progressing   Problem: Skin Integrity: Goal: Risk for impaired skin integrity will decrease Outcome: Progressing   Problem: Spiritual Needs Goal: Ability to function at adequate level Outcome: Progressing   Problem: Spiritual Needs Goal: Ability to function at adequate level Outcome: Progressing

## 2018-06-08 LAB — GLUCOSE, CAPILLARY
Glucose-Capillary: 376 mg/dL — ABNORMAL HIGH (ref 70–99)
Glucose-Capillary: 389 mg/dL — ABNORMAL HIGH (ref 70–99)
Glucose-Capillary: 440 mg/dL — ABNORMAL HIGH (ref 70–99)
Glucose-Capillary: 330 mg/dL — ABNORMAL HIGH (ref 70–99)
Glucose-Capillary: 293 mg/dL — ABNORMAL HIGH (ref 70–99)

## 2018-06-08 LAB — CBC
HCT: 27.8 % — ABNORMAL LOW (ref 36.0–46.0)
Hemoglobin: 8.6 g/dL — ABNORMAL LOW (ref 12.0–15.0)
MCH: 30 pg (ref 26.0–34.0)
MCHC: 30.9 g/dL (ref 30.0–36.0)
MCV: 96.9 fL (ref 80.0–100.0)
PLATELETS: 280 10*3/uL (ref 150–400)
RBC: 2.87 MIL/uL — ABNORMAL LOW (ref 3.87–5.11)
RDW: 14.6 % (ref 11.5–15.5)
WBC: 9.4 10*3/uL (ref 4.0–10.5)
nRBC: 0 % (ref 0.0–0.2)

## 2018-06-08 LAB — CULTURE, BLOOD (ROUTINE X 2)
Culture: NO GROWTH
Culture: NO GROWTH

## 2018-06-08 LAB — GLUCOSE, RANDOM: Glucose, Bld: 505 mg/dL (ref 70–99)

## 2018-06-08 MED ORDER — FUROSEMIDE 10 MG/ML IJ SOLN
40.0000 mg | Freq: Two times a day (BID) | INTRAMUSCULAR | Status: DC
  Administered 2018-06-08: 40 mg via INTRAVENOUS
  Filled 2018-06-08: qty 4

## 2018-06-08 MED ORDER — INSULIN ASPART 100 UNIT/ML ~~LOC~~ SOLN
20.0000 [IU] | Freq: Once | SUBCUTANEOUS | Status: AC
  Administered 2018-06-08: 20 [IU] via SUBCUTANEOUS
  Filled 2018-06-08: qty 1

## 2018-06-08 MED ORDER — IPRATROPIUM-ALBUTEROL 0.5-2.5 (3) MG/3ML IN SOLN
3.0000 mL | Freq: Three times a day (TID) | RESPIRATORY_TRACT | Status: DC
  Administered 2018-06-08 – 2018-06-09 (×5): 3 mL via RESPIRATORY_TRACT
  Filled 2018-06-08 (×6): qty 3

## 2018-06-08 MED ORDER — METHYLPREDNISOLONE SODIUM SUCC 40 MG IJ SOLR
40.0000 mg | Freq: Every day | INTRAMUSCULAR | Status: DC
  Administered 2018-06-09 – 2018-06-10 (×2): 40 mg via INTRAVENOUS
  Filled 2018-06-08 (×2): qty 1

## 2018-06-08 MED ORDER — POTASSIUM CHLORIDE CRYS ER 10 MEQ PO TBCR
10.0000 meq | EXTENDED_RELEASE_TABLET | Freq: Two times a day (BID) | ORAL | Status: DC
  Administered 2018-06-08 – 2018-06-13 (×10): 10 meq via ORAL
  Filled 2018-06-08 (×11): qty 1

## 2018-06-08 MED ORDER — FUROSEMIDE 10 MG/ML IJ SOLN
40.0000 mg | Freq: Every day | INTRAMUSCULAR | Status: DC
  Administered 2018-06-09: 10:00:00 40 mg via INTRAVENOUS
  Filled 2018-06-08: qty 4

## 2018-06-08 NOTE — Progress Notes (Signed)
Inpatient Diabetes Program Recommendations  AACE/ADA: New Consensus Statement on Inpatient Glycemic Control (2019)  Target Ranges:  Prepandial:   less than 140 mg/dL      Peak postprandial:   less than 180 mg/dL (1-2 hours)      Critically ill patients:  140 - 180 mg/dL  Results for April [name], April [name] (MRN [id number]) as of 06/08/2018 08:44  Ref. Range 06/07/2018 08:15 06/07/2018 11:45 06/07/2018 16:41 06/07/2018 21:58 06/08/2018 08:02  Glucose-Capillary Latest Ref Range: 70 - 99 mg/dL 409203 (H) 811307 (H) 914145 (H) 220 (H) 376 (H)    Review of Glycemic Control  Diabetes history:DM2 Outpatient Diabetes medications:No DM meds on home med list; however at last hospital admission was taking Levemir 10 units QHS, Novolog 7 units TID with meals, Glipizide 5 mg daily Current orders for Inpatient glycemic control:Levemir16units daily, Novolog 0-9 units TID with meals, Novolog 0-5 units QHS, Novolog 3 units TID with meals for meal coverage; Solumedrol 40 mg Q12H  Inpatient Diabetes Program Recommendations:   Insulin - Basal: If Solumedrol is continued as ordered, please consider increasing Levemir to 20 units daily. Insulin - Meal Coverage: If Solumedrol is continued as ordered, please consider increasing meal coverage to Novolog 5 units TID with meals for meal coverage if patient eats at least 50% of meals.  NOTE: Noted steroids were increased from Prednisone to Solumedrol. Fasting glucose 376 mg/dl this morning.   Thanks, April PennerMarie Koralynn Greenspan, RN, MSN, CDE Diabetes Coordinator Inpatient Diabetes Program [phone] (Team Pager from 8am to 5pm)

## 2018-06-08 NOTE — Plan of Care (Signed)

## 2018-06-08 NOTE — Progress Notes (Signed)
Patient complaining of lower abdominal pain, bladder scanned patient, scan showed >99199mls of urine in bladder. Received verbal order from Dr. Renae GlossWieting for indwelling foley catheter for urinary retention.

## 2018-06-08 NOTE — Progress Notes (Signed)
Patient ID: April [name], female   DOB: 1956-01-23, 62 y.o. April [name]  MRN: [id number]  [facility] PROGRESS NOTE  April KillingsDoris J Voller [id number] DOB: 1956-01-23 DOA: 06/03/2018 PCP: Mickel FuchsWroth, Thomas H, MD  HPI/Subjective: Patient states she is breathing a little bit better.  Some cough and some wheeze.  Still swollen and having abdominal pain.  Seen this morning.  Objective: Vitals:   06/08/18 0757 06/08/18 1316  BP:  (!) 127/59  Pulse:  (!) 101  Resp:  20  Temp:  99 F (37.2 C)  SpO2: 100% 100%    Filed Weights   06/06/18 0352 06/07/18 0500 06/08/18 0500  Weight: 52.4 kg 50.6 kg 52.5 kg    ROS: Review of Systems  Constitutional: Negative for chills and fever.  Eyes: Negative for blurred vision.  Respiratory: Positive for cough, shortness of breath and wheezing.   Cardiovascular: Negative for chest pain.  Gastrointestinal: Positive for abdominal pain and constipation. Negative for diarrhea, nausea and vomiting.  Genitourinary: Negative for dysuria.  Musculoskeletal: Negative for joint pain.  Neurological: Negative for dizziness and headaches.   Exam: Physical Exam  Constitutional: She is oriented to person, place, and time.  HENT:  Nose: No mucosal edema.  Mouth/Throat: No oropharyngeal exudate or posterior oropharyngeal edema.  Eyes: Pupils are equal, round, and reactive to light. Conjunctivae, EOM and lids are normal.  Neck: No JVD present. Carotid bruit is not present. No edema present. No thyroid mass and no thyromegaly present.  Cardiovascular: S1 normal and S2 normal. Exam reveals no gallop.  No murmur heard. Pulses:      Dorsalis pedis pulses are 2+ on the right side, and 2+ on the left side.  Respiratory: No respiratory distress. She has decreased breath sounds in the right middle field, the right lower field, the left middle field and the left lower field. She has wheezes in the right middle field and the left lower field. She has rhonchi in the right lower field  and the left lower field. She has no rales.  GI: Soft. Bowel sounds are normal. There is tenderness in the suprapubic area.  Musculoskeletal:       Right hip: She exhibits swelling.       Left hip: She exhibits swelling.       Right ankle: She exhibits swelling.       Left ankle: She exhibits swelling.  Lymphadenopathy:    She has no cervical adenopathy.  Neurological: She is alert and oriented to person, place, and time. No cranial nerve deficit.  Skin: Skin is warm. Nails show no clubbing.  Bilateral ankles covered with bandages  Psychiatric: She has a normal mood and affect.      Data Reviewed: Basic Metabolic Panel: Recent Labs  Lab 06/03/18 0345 06/04/18 0554 06/04/18 1451 06/05/18 0443 06/06/18 0408 06/08/18 1225  NA 138 138  --  138 139  --   K 4.5 5.6* 5.3* 5.0 4.6  --   CL 101 107  --  106 108  --   CO2 25 27  --  25 27  --   GLUCOSE 480* 269*  --  91 156* 505*  BUN 47* 52*  --  55* 49*  --   CREATININE 1.11* 0.90  --  0.84 0.84  --   CALCIUM 8.3* 7.7*  --  8.1* 8.1*  --    Liver Function Tests: Recent Labs  Lab 06/03/18 0345  AST 51*  ALT 56*  ALKPHOS 246*  BILITOT 0.6  PROT 6.5  ALBUMIN 2.2*   CBC: Recent Labs  Lab 06/03/18 0345 06/04/18 0554 06/05/18 0443 06/08/18 0507  WBC 10.1 14.2* 15.7* 9.4  NEUTROABS 8.9*  --   --   --   HGB 8.3* 7.3* 7.5* 8.6*  HCT 27.1* 24.2* 25.0* 27.8*  MCV 97.5 99.2 100.8* 96.9  PLT 346 277 279 280   Cardiac Enzymes: Recent Labs  Lab 06/03/18 0345 06/03/18 1017 06/03/18 1554  TROPONINI 0.50* 0.36* 0.37*    CBG: Recent Labs  Lab 06/07/18 1641 06/07/18 2158 06/08/18 0802 06/08/18 0947 06/08/18 1133  GLUCAP 145* 220* 376* 389* 440*    Recent Results (from the past 240 hour(s))  Culture, blood (routine x 2)     Status: None   Collection Time: 06/03/18  2:19 AM  Result Value Ref Range Status   Specimen Description BLOOD LEFT ANTECUBITAL  Final   Special Requests   Final    BOTTLES DRAWN AEROBIC  AND ANAEROBIC Blood Culture results may not be optimal due to an excessive volume of blood received in culture bottles   Culture   Final    NO GROWTH 5 DAYS Performed at [facility], [address] [place], [place] [address]    Report Status 06/08/2018 FINAL  Final  Culture, blood (routine x 2)     Status: None   Collection Time: 06/03/18  3:45 AM  Result Value Ref Range Status   Specimen Description BLOOD LEFT HAND  Final   Special Requests   Final    BOTTLES DRAWN AEROBIC ONLY Blood Culture results may not be optimal due to an inadequate volume of blood received in culture bottles   Culture   Final    NO GROWTH 5 DAYS Performed at [facility], [address] [place], [place] [address]    Report Status 06/08/2018 FINAL  Final  Urine culture     Status: Abnormal   Collection Time: 06/03/18  4:36 AM  Result Value Ref Range Status   Specimen Description   Final    URINE, RANDOM Performed at [facility], [address] [place], [place] [address]    Special Requests   Final    NONE Performed at [facility], [address] [place], [place] [address]    Culture (A)  Final    40,000 COLONIES/mL ESCHERICHIA COLI 70,000 COLONIES/mL ENTEROCOCCUS FAECALIS    Report Status 06/05/2018 FINAL  Final   Organism ID, Bacteria ESCHERICHIA COLI (A)  Final   Organism ID, Bacteria ENTEROCOCCUS FAECALIS (A)  Final      Susceptibility   Escherichia coli - MIC*    AMPICILLIN <=2 SENSITIVE Sensitive     CEFAZOLIN <=4 SENSITIVE Sensitive     CEFTRIAXONE <=1 SENSITIVE Sensitive     CIPROFLOXACIN <=0.25 SENSITIVE Sensitive     GENTAMICIN <=1 SENSITIVE Sensitive     IMIPENEM <=0.25 SENSITIVE Sensitive     NITROFURANTOIN <=16 SENSITIVE Sensitive     TRIMETH/SULFA <=20 SENSITIVE Sensitive     AMPICILLIN/SULBACTAM <=2 SENSITIVE Sensitive     PIP/TAZO <=4 SENSITIVE Sensitive     Extended ESBL NEGATIVE Sensitive     * 40,000 COLONIES/mL ESCHERICHIA COLI    Enterococcus faecalis - MIC*    AMPICILLIN <=2 SENSITIVE Sensitive     LEVOFLOXACIN 2 SENSITIVE Sensitive     NITROFURANTOIN <=16 SENSITIVE Sensitive     VANCOMYCIN 1 SENSITIVE Sensitive     * 70,000 COLONIES/mL ENTEROCOCCUS FAECALIS  MRSA PCR Screening  Status: None   Collection Time: 06/03/18  6:26 AM  Result Value Ref Range Status   MRSA by PCR NEGATIVE NEGATIVE Final    Comment:        The GeneXpert MRSA Assay (FDA approved for NASAL specimens only), is one component of a comprehensive MRSA colonization surveillance program. It is not intended to diagnose MRSA infection nor to guide or monitor treatment for MRSA infections. Performed at [facility], [address], [place], [place] [address]      Studies: Ct Angio Chest Pe W Or Wo Contrast  Result Date: 06/07/2018 CLINICAL DATA:  Acute respiratory failure. COPD with abdominal swelling and anemia. EXAM: CT ANGIOGRAPHY CHEST CT ABDOMEN AND PELVIS WITH CONTRAST TECHNIQUE: Multidetector CT imaging of the chest was performed using the standard protocol during bolus administration of intravenous contrast. Multiplanar CT image reconstructions and MIPs were obtained to evaluate the vascular anatomy. Multidetector CT imaging of the abdomen and pelvis was performed using the standard protocol during bolus administration of intravenous contrast. CONTRAST:  75mL OMNIPAQUE IOHEXOL 350 MG/ML SOLN COMPARISON:  Abdomen and pelvis CT 03/17/2013.  Chest CT 08/21/2013. FINDINGS: CTA CHEST FINDINGS Cardiovascular: Heart size upper normal. No substantial pericardial effusion. Atherosclerotic calcification is noted in the wall of the thoracic aorta. Pulmonary arterial enlargement suggests pulmonary arterial hypertension. The no filling defect in the opacified pulmonary arteries to suggest the presence of an acute pulmonary embolus. Mediastinum/Nodes: No mediastinal lymphadenopathy. There is no hilar lymphadenopathy. There is no axillary  lymphadenopathy. The esophagus has normal imaging features. Lungs/Pleura: Small to moderate bilateral pleural effusions are evident. Centrilobular emphysema noted in the lungs bilaterally. 5 mm left upper lobe pulmonary nodule (11/6) has decreased from 7 mm previously. 3 mm posterior left apical nodule is stable. No change in anterior left upper lobe tiny nodules (images 29, 30/6). 2 mm left upper lobe nodule (37/6) is new since prior study. Several right lung nodules are evident and are stable since 08/21/2013. Lingular and bilateral lower lobe collapse/consolidation evident. Musculoskeletal: No worrisome lytic or sclerotic osseous abnormality. Review of the MIP images confirms the above findings. CT ABDOMEN and PELVIS FINDINGS Hepatobiliary: Small hypoattenuating subcapsular lesion in the lateral segment left liver is unchanged since 03/17/2013. Liver otherwise unremarkable. Gallbladder surgically absent. Mild prominence of the extrahepatic bile duct is stable. Pancreas: No focal mass lesion. No dilatation of the main duct. No intraparenchymal cyst. No peripancreatic edema. Spleen: No splenomegaly. No focal mass lesion. Adrenals/Urinary Tract: No adrenal nodule or mass. A pair of adjacent 2 mm nonobstructing stones identified in the interpolar right kidney. Tiny nonobstructing stones are seen in the left kidney (best appreciated on coronal images. No evidence for hydroureter. Bladder is markedly distended with the bladder dome up at the level of the umbilicus. Stomach/Bowel: Stomach is nondistended. No gastric wall thickening. No evidence of outlet obstruction. Duodenum is normally positioned as is the ligament of Treitz. No small bowel wall thickening. No small bowel dilatation. Neither the terminal ileum nor the appendix are discretely discernible. Moderate stool volume seen along the length of the colon Vascular/Lymphatic: There is abdominal aortic atherosclerosis without aneurysm. Patient is status post stent  placement in both common iliac arteries. There appears to be a stent or graft at the left SFA . There is no gastrohepatic or hepatoduodenal ligament lymphadenopathy. No intraperitoneal or retroperitoneal lymphadenopathy. No pelvic sidewall lymphadenopathy. Reproductive: Uterus unremarkable.  There is no adnexal mass. Other: Small volume free fluid identified in the pelvis. Musculoskeletal: There is extensive body  wall edema in the abdomen and pelvis with mesenteric and retroperitoneal edema associated. No worrisome lytic or sclerotic osseous abnormality. Review of the MIP images confirms the above findings. IMPRESSION: 1. No CT evidence for acute pulmonary embolus. 2. Massive distention of the urinary bladder. Bladder outlet obstruction would be a consideration. 3. Prominence of the main pulmonary arteries suggests pulmonary arterial hypertension. 4. Small to moderate bilateral pleural effusions with lingular and bilateral lower lobe collapse/consolidation. 5. Numerous bilateral pulmonary nodules. The majority of these nodules are stable since 08/21/2013, consistent with benign etiology. A single 2 mm nodule in the left upper lobe is identified as new on the current study. No follow-up needed if patient is low-risk. Non-contrast chest CT can be considered in 12 months if patient is high-risk. This recommendation follows the consensus statement: Guidelines for Management of Incidental Pulmonary Nodules Detected on CT Images: From the Fleischner Society 2017; Radiology 2017; 284:228-243. 6. Tiny bilateral nonobstructing renal stones. 7. Extensive body wall edema with retroperitoneal and mesenteric edema associated. 8.  Aortic Atherosclerois (ICD10-170.0) 9.  Emphysema. (ZOX09-U04.9) Electronically Signed   By: Kennith Center M.D.   On: 06/07/2018 19:49   Ct Abdomen Pelvis W Contrast  Result Date: 06/07/2018 CLINICAL DATA:  Acute respiratory failure. COPD with abdominal swelling and anemia. EXAM: CT ANGIOGRAPHY  CHEST CT ABDOMEN AND PELVIS WITH CONTRAST TECHNIQUE: Multidetector CT imaging of the chest was performed using the standard protocol during bolus administration of intravenous contrast. Multiplanar CT image reconstructions and MIPs were obtained to evaluate the vascular anatomy. Multidetector CT imaging of the abdomen and pelvis was performed using the standard protocol during bolus administration of intravenous contrast. CONTRAST:  75mL OMNIPAQUE IOHEXOL 350 MG/ML SOLN COMPARISON:  Abdomen and pelvis CT 03/17/2013.  Chest CT 08/21/2013. FINDINGS: CTA CHEST FINDINGS Cardiovascular: Heart size upper normal. No substantial pericardial effusion. Atherosclerotic calcification is noted in the wall of the thoracic aorta. Pulmonary arterial enlargement suggests pulmonary arterial hypertension. The no filling defect in the opacified pulmonary arteries to suggest the presence of an acute pulmonary embolus. Mediastinum/Nodes: No mediastinal lymphadenopathy. There is no hilar lymphadenopathy. There is no axillary lymphadenopathy. The esophagus has normal imaging features. Lungs/Pleura: Small to moderate bilateral pleural effusions are evident. Centrilobular emphysema noted in the lungs bilaterally. 5 mm left upper lobe pulmonary nodule (11/6) has decreased from 7 mm previously. 3 mm posterior left apical nodule is stable. No change in anterior left upper lobe tiny nodules (images 29, 30/6). 2 mm left upper lobe nodule (37/6) is new since prior study. Several right lung nodules are evident and are stable since 08/21/2013. Lingular and bilateral lower lobe collapse/consolidation evident. Musculoskeletal: No worrisome lytic or sclerotic osseous abnormality. Review of the MIP images confirms the above findings. CT ABDOMEN and PELVIS FINDINGS Hepatobiliary: Small hypoattenuating subcapsular lesion in the lateral segment left liver is unchanged since 03/17/2013. Liver otherwise unremarkable. Gallbladder surgically absent. Mild  prominence of the extrahepatic bile duct is stable. Pancreas: No focal mass lesion. No dilatation of the main duct. No intraparenchymal cyst. No peripancreatic edema. Spleen: No splenomegaly. No focal mass lesion. Adrenals/Urinary Tract: No adrenal nodule or mass. A pair of adjacent 2 mm nonobstructing stones identified in the interpolar right kidney. Tiny nonobstructing stones are seen in the left kidney (best appreciated on coronal images. No evidence for hydroureter. Bladder is markedly distended with the bladder dome up at the level of the umbilicus. Stomach/Bowel: Stomach is nondistended. No gastric wall thickening. No evidence of outlet obstruction. Duodenum is normally positioned  as is the ligament of Treitz. No small bowel wall thickening. No small bowel dilatation. Neither the terminal ileum nor the appendix are discretely discernible. Moderate stool volume seen along the length of the colon Vascular/Lymphatic: There is abdominal aortic atherosclerosis without aneurysm. Patient is status post stent placement in both common iliac arteries. There appears to be a stent or graft at the left SFA . There is no gastrohepatic or hepatoduodenal ligament lymphadenopathy. No intraperitoneal or retroperitoneal lymphadenopathy. No pelvic sidewall lymphadenopathy. Reproductive: Uterus unremarkable.  There is no adnexal mass. Other: Small volume free fluid identified in the pelvis. Musculoskeletal: There is extensive body wall edema in the abdomen and pelvis with mesenteric and retroperitoneal edema associated. No worrisome lytic or sclerotic osseous abnormality. Review of the MIP images confirms the above findings. IMPRESSION: 1. No CT evidence for acute pulmonary embolus. 2. Massive distention of the urinary bladder. Bladder outlet obstruction would be a consideration. 3. Prominence of the main pulmonary arteries suggests pulmonary arterial hypertension. 4. Small to moderate bilateral pleural effusions with lingular and  bilateral lower lobe collapse/consolidation. 5. Numerous bilateral pulmonary nodules. The majority of these nodules are stable since 08/21/2013, consistent with benign etiology. A single 2 mm nodule in the left upper lobe is identified as new on the current study. No follow-up needed if patient is low-risk. Non-contrast chest CT can be considered in 12 months if patient is high-risk. This recommendation follows the consensus statement: Guidelines for Management of Incidental Pulmonary Nodules Detected on CT Images: From the Fleischner Society 2017; Radiology 2017; 284:228-243. 6. Tiny bilateral nonobstructing renal stones. 7. Extensive body wall edema with retroperitoneal and mesenteric edema associated. 8.  Aortic Atherosclerois (ICD10-170.0) 9.  Emphysema. (ZOX09-U04.9) Electronically Signed   By: Kennith Center M.D.   On: 06/07/2018 19:49    Scheduled Meds: . amoxicillin  500 mg Oral Q8H  . aspirin  81 mg Oral Daily  . atorvastatin  40 mg Oral Daily  . budesonide (PULMICORT) nebulizer solution  0.5 mg Nebulization BID  . collagenase  1 application Topical Daily  . docusate sodium  100 mg Oral BID  . furosemide  40 mg Intravenous BID  . insulin aspart  0-5 Units Subcutaneous QHS  . insulin aspart  0-9 Units Subcutaneous TID WC  . insulin aspart  3 Units Subcutaneous TID WC  . insulin detemir  16 Units Subcutaneous Daily  . ipratropium-albuterol  3 mL Nebulization TID  . methylPREDNISolone (SOLU-MEDROL) injection  40 mg Intravenous Q12H  . multivitamin-lutein  1 capsule Oral Daily  . pantoprazole  40 mg Oral BID  . polyethylene glycol  17 g Oral Daily  . potassium chloride  10 mEq Oral BID  . protein supplement shake  11 oz Oral BID BM  . rivaroxaban  10 mg Oral QPM  . vitamin C  500 mg Oral BID   Continuous Infusions:  Assessment/Plan:  1. Acute hypoxic respiratory failure.  The patient states that she does not wear oxygen at home.  As per nursing staff she desaturates quite quickly.   High likelihood that the patient will need home oxygen. 2. COPD exacerbation.  With sugars remaining high will change Solu-Medrol to daily dosing.  Continue nebulizer treatments. 3. Urinary retention.  CT scan showing bladder distention and bladder ultrasound with distention.  Foley catheter placed. 4. Acute cystitis with enterococcus and E. coli.  Switched over to amoxicillin 5. Abdominal swelling and leg swelling improved after Foley catheter placed.  We will give Lasix daily dosing to  remove some extra fluid. 6. Multiple pulmonary nodules.  Stable from previous CAT scan. 7. Peripheral vascular disease on Xarelto 8. 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 GERD on Protonix 10. Anemia of chronic disease 11. Elevated troponin secondary to demand ischemia 12. Left heel full-thickness ulceration as per podiatry 13. Stable postsurgical changes of the right first metatarsal as per podiatry  Code Status:     Code Status Orders  (From admission, onward)         Start     Ordered   06/03/18 0622  Full code  Continuous     06/03/18 0621        Code Status History    Date Active Date Inactive Code Status Order ID Comments User Context   05/23/2018 0253 06/01/2018 2158 Full Code [id number]  Cammy Copa, MD Inpatient   05/23/2018 0138 05/23/2018 0253 DNR [id number]  Cammy Copa, MD ED   04/10/2018 1111 04/11/2018 1554 DNR [id number]  Milagros Loll, MD Inpatient   04/09/2018 1519 04/10/2018 1111 Full Code [id number]  Milagros Loll, MD Inpatient   04/09/2018 1337 04/09/2018 1519 DNR [id number]  Milagros Loll, MD Inpatient   04/09/2018 0012 04/09/2018 1337 Full Code [id number]  Oralia Manis, MD ED     Family Communication: Daughter at the bedside Disposition Plan: To be determined  Antibiotics:  Amoxicillin  Time spent: 29 minutes  Seher Schlagel [employer]

## 2018-06-09 LAB — GLUCOSE, CAPILLARY
Glucose-Capillary: 255 mg/dL — ABNORMAL HIGH (ref 70–99)
Glucose-Capillary: 266 mg/dL — ABNORMAL HIGH (ref 70–99)
Glucose-Capillary: 173 mg/dL — ABNORMAL HIGH (ref 70–99)
Glucose-Capillary: 140 mg/dL — ABNORMAL HIGH (ref 70–99)

## 2018-06-09 MED ORDER — MENTHOL 3 MG MT LOZG
1.0000 | LOZENGE | OROMUCOSAL | Status: DC | PRN
  Filled 2018-06-09: qty 9

## 2018-06-09 MED ORDER — BUSPIRONE HCL 5 MG PO TABS
5.0000 mg | ORAL_TABLET | Freq: Two times a day (BID) | ORAL | Status: DC
  Administered 2018-06-09 – 2018-06-10 (×3): 5 mg via ORAL
  Filled 2018-06-09 (×3): qty 1

## 2018-06-09 MED ORDER — INSULIN ASPART 100 UNIT/ML ~~LOC~~ SOLN
6.0000 [IU] | Freq: Three times a day (TID) | SUBCUTANEOUS | Status: DC
  Administered 2018-06-09 – 2018-06-11 (×4): 6 [IU] via SUBCUTANEOUS
  Filled 2018-06-09 (×3): qty 1

## 2018-06-09 MED ORDER — KETOROLAC TROMETHAMINE 30 MG/ML IJ SOLN: 30.0000 mg | Freq: Two times a day (BID) | INTRAMUSCULAR | Status: DC | PRN

## 2018-06-09 MED ORDER — MENTHOL 3 MG MT LOZG
1.0000 | LOZENGE | Freq: Four times a day (QID) | OROMUCOSAL | Status: AC
  Administered 2018-06-09 – 2018-06-10 (×2): 3 mg via ORAL
  Filled 2018-06-09: qty 9

## 2018-06-09 MED ORDER — INSULIN DETEMIR 100 UNIT/ML ~~LOC~~ SOLN
20.0000 [IU] | Freq: Every day | SUBCUTANEOUS | Status: DC
  Administered 2018-06-09 – 2018-06-11 (×3): 20 [IU] via SUBCUTANEOUS
  Filled 2018-06-09 (×3): qty 0.2

## 2018-06-09 NOTE — Progress Notes (Signed)
PT Cancellation Note  Patient Details Name: April [name] MRN: [id number] DOB: 05-30-1956   Cancelled Treatment:    Reason Eval/Treat Not Completed: Patient declined, no reason specified.  PT consult received.  Chart reviewed.  Pt reporting being SOB just talking with therapist and declined any physical therapy d/t this.  PT discussed checking pt's O2 but pt declined reporting that she recently had it checked and it was fine.  Nurse notified of above and aware; nurse also reporting pt gets SOB with talking.  Will re-attempt PT evaluation at a later date/time.  Hendricks LimesEmily Jasan Doughtie, PT 06/09/18, 3:58 PM [phone]

## 2018-06-09 NOTE — Progress Notes (Signed)
Patient ID: JANITA CAMBEROS, female   DOB: 09-18-1955, 62 y.o.   MRN: [id number]  [facility] PROGRESS NOTE  AMALIE KORAN JXB:[id number] DOB: 04/17/1956 DOA: 06/03/2018 PCP: Mickel Fuchs, MD  HPI/Subjective: Patient states her breathing is not good.  Still complains of shortness of breath cough and wheeze.  Objective: Vitals:   06/09/18 0356 06/09/18 0927  BP: 108/60 112/67  Pulse: 96 (!) 121  Resp: 17 (!) 22  Temp: 98.2 F (36.8 C) 97.9 F (36.6 C)  SpO2: 99% 97%    Filed Weights   06/07/18 0500 06/08/18 0500 06/09/18 0356  Weight: 50.6 kg 52.5 kg 47.7 kg    ROS: Review of Systems  Constitutional: Negative for chills and fever.  Eyes: Negative for blurred vision.  Respiratory: Positive for cough, shortness of breath and wheezing.   Cardiovascular: Negative for chest pain.  Gastrointestinal: Negative for abdominal pain, constipation, diarrhea, nausea and vomiting.  Genitourinary: Negative for dysuria.  Musculoskeletal: Negative for joint pain.  Neurological: Negative for dizziness and headaches.   Exam: Physical Exam  Constitutional: She is oriented to person, place, and time.  HENT:  Nose: No mucosal edema.  Mouth/Throat: No oropharyngeal exudate or posterior oropharyngeal edema.  Eyes: Pupils are equal, round, and reactive to light. Conjunctivae, EOM and lids are normal.  Neck: No JVD present. Carotid bruit is not present. No edema present. No thyroid mass and no thyromegaly present.  Cardiovascular: S1 normal and S2 normal. Exam reveals no gallop.  No murmur heard. Pulses:      Dorsalis pedis pulses are 2+ on the right side, and 2+ on the left side.  Respiratory: No respiratory distress. She has decreased breath sounds in the right middle field, the right lower field, the left middle field and the left lower field. She has wheezes in the right middle field and the left lower field. She has rhonchi in the right lower field and the left lower field. She has  no rales.  GI: Soft. Bowel sounds are normal. There is no tenderness.  Musculoskeletal:       Right hip: She exhibits swelling.       Left hip: She exhibits swelling.       Right ankle: She exhibits swelling.       Left ankle: She exhibits swelling.  Lymphadenopathy:    She has no cervical adenopathy.  Neurological: She is alert and oriented to person, place, and time. No cranial nerve deficit.  Skin: Skin is warm. Nails show no clubbing.  Bilateral ankles covered with bandages  Psychiatric: She has a normal mood and affect.      Data Reviewed: Basic Metabolic Panel: Recent Labs  Lab 06/03/18 0345 06/04/18 0554 06/04/18 1451 06/05/18 0443 06/06/18 0408 06/08/18 1225  NA 138 138  --  138 139  --   K 4.5 5.6* 5.3* 5.0 4.6  --   CL 101 107  --  106 108  --   CO2 25 27  --  25 27  --   GLUCOSE 480* 269*  --  91 156* 505*  BUN 47* 52*  --  55* 49*  --   CREATININE 1.11* 0.90  --  0.84 0.84  --   CALCIUM 8.3* 7.7*  --  8.1* 8.1*  --    Liver Function Tests: Recent Labs  Lab 06/03/18 0345  AST 51*  ALT 56*  ALKPHOS 246*  BILITOT 0.6  PROT 6.5  ALBUMIN 2.2*   CBC: Recent Labs  Lab 06/03/18 0345 06/04/18 0554 06/05/18 0443 06/08/18 0507  WBC 10.1 14.2* 15.7* 9.4  NEUTROABS 8.9*  --   --   --   HGB 8.3* 7.3* 7.5* 8.6*  HCT 27.1* 24.2* 25.0* 27.8*  MCV 97.5 99.2 100.8* 96.9  PLT 346 277 279 280   Cardiac Enzymes: Recent Labs  Lab 06/03/18 0345 06/03/18 1017 06/03/18 1554  TROPONINI 0.50* 0.36* 0.37*    CBG: Recent Labs  Lab 06/08/18 1133 06/08/18 1645 06/08/18 2116 06/09/18 0756 06/09/18 1206  GLUCAP 440* 330* 293* 255* 266*    Recent Results (from the past 240 hour(s))  Culture, blood (routine x 2)     Status: None   Collection Time: 06/03/18  2:19 AM  Result Value Ref Range Status   Specimen Description BLOOD LEFT ANTECUBITAL  Final   Special Requests   Final    BOTTLES DRAWN AEROBIC AND ANAEROBIC Blood Culture results may not be optimal  due to an excessive volume of blood received in culture bottles   Culture   Final    NO GROWTH 5 DAYS Performed at [facility], [address] [place], [place] [address]    Report Status 06/08/2018 FINAL  Final  Culture, blood (routine x 2)     Status: None   Collection Time: 06/03/18  3:45 AM  Result Value Ref Range Status   Specimen Description BLOOD LEFT HAND  Final   Special Requests   Final    BOTTLES DRAWN AEROBIC ONLY Blood Culture results may not be optimal due to an inadequate volume of blood received in culture bottles   Culture   Final    NO GROWTH 5 DAYS Performed at [facility], [address] [place], [place] [address]    Report Status 06/08/2018 FINAL  Final  Urine culture     Status: Abnormal   Collection Time: 06/03/18  4:36 AM  Result Value Ref Range Status   Specimen Description   Final    URINE, RANDOM Performed at [facility], [address] [place], [place] [address]    Special Requests   Final    NONE Performed at [facility], [address] [place], [place] [address]    Culture (A)  Final    40,000 COLONIES/mL ESCHERICHIA COLI 70,000 COLONIES/mL ENTEROCOCCUS FAECALIS    Report Status 06/05/2018 FINAL  Final   Organism ID, Bacteria ESCHERICHIA COLI (A)  Final   Organism ID, Bacteria ENTEROCOCCUS FAECALIS (A)  Final      Susceptibility   Escherichia coli - MIC*    AMPICILLIN <=2 SENSITIVE Sensitive     CEFAZOLIN <=4 SENSITIVE Sensitive     CEFTRIAXONE <=1 SENSITIVE Sensitive     CIPROFLOXACIN <=0.25 SENSITIVE Sensitive     GENTAMICIN <=1 SENSITIVE Sensitive     IMIPENEM <=0.25 SENSITIVE Sensitive     NITROFURANTOIN <=16 SENSITIVE Sensitive     TRIMETH/SULFA <=20 SENSITIVE Sensitive     AMPICILLIN/SULBACTAM <=2 SENSITIVE Sensitive     PIP/TAZO <=4 SENSITIVE Sensitive     Extended ESBL NEGATIVE Sensitive     * 40,000 COLONIES/mL ESCHERICHIA COLI   Enterococcus faecalis - MIC*    AMPICILLIN <=2  SENSITIVE Sensitive     LEVOFLOXACIN 2 SENSITIVE Sensitive     NITROFURANTOIN <=16 SENSITIVE Sensitive     VANCOMYCIN 1 SENSITIVE Sensitive     * 70,000 COLONIES/mL ENTEROCOCCUS FAECALIS  MRSA PCR Screening     Status: None   Collection Time: 06/03/18  6:26 AM  Result Value Ref Range Status   MRSA by PCR NEGATIVE NEGATIVE Final    Comment:        The GeneXpert MRSA Assay (FDA approved for NASAL specimens only), is one component of a comprehensive MRSA colonization surveillance program. It is not intended to diagnose MRSA infection nor to guide or monitor treatment for MRSA infections. Performed at [facility], [address], [place], [place] [address]      Studies: Ct Angio Chest Pe W Or Wo Contrast  Result Date: 06/07/2018 CLINICAL DATA:  Acute respiratory failure. COPD with abdominal swelling and anemia. EXAM: CT ANGIOGRAPHY CHEST CT ABDOMEN AND PELVIS WITH CONTRAST TECHNIQUE: Multidetector CT imaging of the chest was performed using the standard protocol during bolus administration of intravenous contrast. Multiplanar CT image reconstructions and MIPs were obtained to evaluate the vascular anatomy. Multidetector CT imaging of the abdomen and pelvis was performed using the standard protocol during bolus administration of intravenous contrast. CONTRAST:  75mL OMNIPAQUE IOHEXOL 350 MG/ML SOLN COMPARISON:  Abdomen and pelvis CT 03/17/2013.  Chest CT 08/21/2013. FINDINGS: CTA CHEST FINDINGS Cardiovascular: Heart size upper normal. No substantial pericardial effusion. Atherosclerotic calcification is noted in the wall of the thoracic aorta. Pulmonary arterial enlargement suggests pulmonary arterial hypertension. The no filling defect in the opacified pulmonary arteries to suggest the presence of an acute pulmonary embolus. Mediastinum/Nodes: No mediastinal lymphadenopathy. There is no hilar lymphadenopathy. There is no axillary lymphadenopathy. The esophagus has normal imaging  features. Lungs/Pleura: Small to moderate bilateral pleural effusions are evident. Centrilobular emphysema noted in the lungs bilaterally. 5 mm left upper lobe pulmonary nodule (11/6) has decreased from 7 mm previously. 3 mm posterior left apical nodule is stable. No change in anterior left upper lobe tiny nodules (images 29, 30/6). 2 mm left upper lobe nodule (37/6) is new since prior study. Several right lung nodules are evident and are stable since 08/21/2013. Lingular and bilateral lower lobe collapse/consolidation evident. Musculoskeletal: No worrisome lytic or sclerotic osseous abnormality. Review of the MIP images confirms the above findings. CT ABDOMEN and PELVIS FINDINGS Hepatobiliary: Small hypoattenuating subcapsular lesion in the lateral segment left liver is unchanged since 03/17/2013. Liver otherwise unremarkable. Gallbladder surgically absent. Mild prominence of the extrahepatic bile duct is stable. Pancreas: No focal mass lesion. No dilatation of the main duct. No intraparenchymal cyst. No peripancreatic edema. Spleen: No splenomegaly. No focal mass lesion. Adrenals/Urinary Tract: No adrenal nodule or mass. A pair of adjacent 2 mm nonobstructing stones identified in the interpolar right kidney. Tiny nonobstructing stones are seen in the left kidney (best appreciated on coronal images. No evidence for hydroureter. Bladder is markedly distended with the bladder dome up at the level of the umbilicus. Stomach/Bowel: Stomach is nondistended. No gastric wall thickening. No evidence of outlet obstruction. Duodenum is normally positioned as is the ligament of Treitz. No small bowel wall thickening. No small bowel dilatation. Neither the terminal ileum nor the appendix are discretely discernible. Moderate stool volume seen along the length of the colon Vascular/Lymphatic: There is abdominal aortic atherosclerosis without aneurysm. Patient is status post stent placement in both common iliac arteries. There  appears to be a stent or graft at the left SFA . There is no gastrohepatic or hepatoduodenal ligament lymphadenopathy. No intraperitoneal or retroperitoneal lymphadenopathy. No pelvic sidewall lymphadenopathy. Reproductive: Uterus unremarkable.  There is no adnexal mass. Other: Small volume free fluid identified in the pelvis. Musculoskeletal: There is extensive body wall edema in the abdomen and pelvis with mesenteric and retroperitoneal  edema associated. No worrisome lytic or sclerotic osseous abnormality. Review of the MIP images confirms the above findings. IMPRESSION: 1. No CT evidence for acute pulmonary embolus. 2. Massive distention of the urinary bladder. Bladder outlet obstruction would be a consideration. 3. Prominence of the main pulmonary arteries suggests pulmonary arterial hypertension. 4. Small to moderate bilateral pleural effusions with lingular and bilateral lower lobe collapse/consolidation. 5. Numerous bilateral pulmonary nodules. The majority of these nodules are stable since 08/21/2013, consistent with benign etiology. A single 2 mm nodule in the left upper lobe is identified as new on the current study. No follow-up needed if patient is low-risk. Non-contrast chest CT can be considered in 12 months if patient is high-risk. This recommendation follows the consensus statement: Guidelines for Management of Incidental Pulmonary Nodules Detected on CT Images: From the Fleischner Society 2017; Radiology 2017; 284:228-243. 6. Tiny bilateral nonobstructing renal stones. 7. Extensive body wall edema with retroperitoneal and mesenteric edema associated. 8.  Aortic Atherosclerois (ICD10-170.0) 9.  Emphysema. ([id number](ICD10-J43.9) Electronically Signed   By: Kennith CenterEric  Mansell M.D.   On: 06/07/2018 19:49   Ct Abdomen Pelvis W Contrast  Result Date: 06/07/2018 CLINICAL DATA:  Acute respiratory failure. COPD with abdominal swelling and anemia. EXAM: CT ANGIOGRAPHY CHEST CT ABDOMEN AND PELVIS WITH CONTRAST  TECHNIQUE: Multidetector CT imaging of the chest was performed using the standard protocol during bolus administration of intravenous contrast. Multiplanar CT image reconstructions and MIPs were obtained to evaluate the vascular anatomy. Multidetector CT imaging of the abdomen and pelvis was performed using the standard protocol during bolus administration of intravenous contrast. CONTRAST:  75mL OMNIPAQUE IOHEXOL 350 MG/ML SOLN COMPARISON:  Abdomen and pelvis CT 03/17/2013.  Chest CT 08/21/2013. FINDINGS: CTA CHEST FINDINGS Cardiovascular: Heart size upper normal. No substantial pericardial effusion. Atherosclerotic calcification is noted in the wall of the thoracic aorta. Pulmonary arterial enlargement suggests pulmonary arterial hypertension. The no filling defect in the opacified pulmonary arteries to suggest the presence of an acute pulmonary embolus. Mediastinum/Nodes: No mediastinal lymphadenopathy. There is no hilar lymphadenopathy. There is no axillary lymphadenopathy. The esophagus has normal imaging features. Lungs/Pleura: Small to moderate bilateral pleural effusions are evident. Centrilobular emphysema noted in the lungs bilaterally. 5 mm left upper lobe pulmonary nodule (11/6) has decreased from 7 mm previously. 3 mm posterior left apical nodule is stable. No change in anterior left upper lobe tiny nodules (images 29, 30/6). 2 mm left upper lobe nodule (37/6) is new since prior study. Several right lung nodules are evident and are stable since 08/21/2013. Lingular and bilateral lower lobe collapse/consolidation evident. Musculoskeletal: No worrisome lytic or sclerotic osseous abnormality. Review of the MIP images confirms the above findings. CT ABDOMEN and PELVIS FINDINGS Hepatobiliary: Small hypoattenuating subcapsular lesion in the lateral segment left liver is unchanged since 03/17/2013. Liver otherwise unremarkable. Gallbladder surgically absent. Mild prominence of the extrahepatic bile duct is  stable. Pancreas: No focal mass lesion. No dilatation of the main duct. No intraparenchymal cyst. No peripancreatic edema. Spleen: No splenomegaly. No focal mass lesion. Adrenals/Urinary Tract: No adrenal nodule or mass. A pair of adjacent 2 mm nonobstructing stones identified in the interpolar right kidney. Tiny nonobstructing stones are seen in the left kidney (best appreciated on coronal images. No evidence for hydroureter. Bladder is markedly distended with the bladder dome up at the level of the umbilicus. Stomach/Bowel: Stomach is nondistended. No gastric wall thickening. No evidence of outlet obstruction. Duodenum is normally positioned as is the ligament of Treitz. No small bowel wall thickening.  No small bowel dilatation. Neither the terminal ileum nor the appendix are discretely discernible. Moderate stool volume seen along the length of the colon Vascular/Lymphatic: There is abdominal aortic atherosclerosis without aneurysm. Patient is status post stent placement in both common iliac arteries. There appears to be a stent or graft at the left SFA . There is no gastrohepatic or hepatoduodenal ligament lymphadenopathy. No intraperitoneal or retroperitoneal lymphadenopathy. No pelvic sidewall lymphadenopathy. Reproductive: Uterus unremarkable.  There is no adnexal mass. Other: Small volume free fluid identified in the pelvis. Musculoskeletal: There is extensive body wall edema in the abdomen and pelvis with mesenteric and retroperitoneal edema associated. No worrisome lytic or sclerotic osseous abnormality. Review of the MIP images confirms the above findings. IMPRESSION: 1. No CT evidence for acute pulmonary embolus. 2. Massive distention of the urinary bladder. Bladder outlet obstruction would be a consideration. 3. Prominence of the main pulmonary arteries suggests pulmonary arterial hypertension. 4. Small to moderate bilateral pleural effusions with lingular and bilateral lower lobe  collapse/consolidation. 5. Numerous bilateral pulmonary nodules. The majority of these nodules are stable since 08/21/2013, consistent with benign etiology. A single 2 mm nodule in the left upper lobe is identified as new on the current study. No follow-up needed if patient is low-risk. Non-contrast chest CT can be considered in 12 months if patient is high-risk. This recommendation follows the consensus statement: Guidelines for Management of Incidental Pulmonary Nodules Detected on CT Images: From the Fleischner Society 2017; Radiology 2017; 284:228-243. 6. Tiny bilateral nonobstructing renal stones. 7. Extensive body wall edema with retroperitoneal and mesenteric edema associated. 8.  Aortic Atherosclerois (ICD10-170.0) 9.  Emphysema. (ZOX09-U04.9) Electronically Signed   By: Kennith Center M.D.   On: 06/07/2018 19:49    Scheduled Meds: . amoxicillin  500 mg Oral Q8H  . aspirin  81 mg Oral Daily  . atorvastatin  40 mg Oral Daily  . budesonide (PULMICORT) nebulizer solution  0.5 mg Nebulization BID  . busPIRone  5 mg Oral BID  . collagenase  1 application Topical Daily  . docusate sodium  100 mg Oral BID  . furosemide  40 mg Intravenous Daily  . insulin aspart  0-5 Units Subcutaneous QHS  . insulin aspart  0-9 Units Subcutaneous TID WC  . insulin aspart  6 Units Subcutaneous TID WC  . insulin detemir  20 Units Subcutaneous Daily  . ipratropium-albuterol  3 mL Nebulization TID  . methylPREDNISolone (SOLU-MEDROL) injection  40 mg Intravenous Daily  . multivitamin-lutein  1 capsule Oral Daily  . pantoprazole  40 mg Oral BID  . polyethylene glycol  17 g Oral Daily  . potassium chloride  10 mEq Oral BID  . protein supplement shake  11 oz Oral BID BM  . rivaroxaban  10 mg Oral QPM  . vitamin C  500 mg Oral BID   Continuous Infusions:  Assessment/Plan:  1. Acute hypoxic respiratory failure.  As per nursing staff she desaturates quite quickly.  High likelihood that the patient will need home  oxygen. 2. COPD exacerbation.  With sugars remaining high I changed Solu-Medrol to daily dosing yesterday.  Continue nebulizer treatments. 3. Urinary retention.  CT scan showing bladder distention and bladder ultrasound with distention.  Foley catheter placed yesterday. 4. Acute cystitis with enterococcus and E. coli.  Switched over to amoxicillin 5. Abdominal swelling and leg swelling improved after Foley catheter placed.  We will give Lasix daily dosing to remove some extra fluid. 6. Multiple pulmonary nodules.  Stable from previous CAT  scan. 7. Peripheral vascular disease on Xarelto 8. Type 2 diabetes with neuropathy on increased dose Levemir and sliding scale plus standing though short acting insulin prior to meals 9. GERD on Protonix 10. Anemia of chronic disease 11. Elevated troponin secondary to demand ischemia 12. Left heel full-thickness ulceration as per podiatry 13. Stable postsurgical changes of the right first metatarsal as per podiatry 14. Weakness physical therapy evaluation.  Toe-touch left foot and heel touch right foot  Code Status:     Code Status Orders  (From admission, onward)         Start     Ordered   06/03/18 0622  Full code  Continuous     06/03/18 0621        Code Status History    Date Active Date Inactive Code Status Order ID Comments User Context   05/23/2018 0253 06/01/2018 2158 Full Code [id number]  Cammy Copa, MD Inpatient   05/23/2018 0138 05/23/2018 0253 DNR [id number]  Cammy Copa, MD ED   04/10/2018 1111 04/11/2018 1554 DNR [id number]  Milagros Loll, MD Inpatient   04/09/2018 1519 04/10/2018 1111 Full Code [id number]  Milagros Loll, MD Inpatient   04/09/2018 1337 04/09/2018 1519 DNR [id number]  Milagros Loll, MD Inpatient   04/09/2018 0012 04/09/2018 1337 Full Code [id number]  Oralia Manis, MD ED     Family Communication: Left message for Vanetta Mulders on his answering machine Disposition Plan: To be  determined  Antibiotics:  Amoxicillin  Time spent: 28 minutes  Giavonna Pflum [employer]

## 2018-06-09 NOTE — Progress Notes (Addendum)
Late-entry note added by Staci Acostahelsea Miller to reflect treatment hold/cancellation for date of service 06/08/18; back-dated to reflect appropriate sequence.  Irja Wheless H. Manson PasseyBrown, PT, DPT, NCS 06/09/18, 11:26 AM [phone]    Physical Therapy Treatment Patient Details Name: April KillingsDoris J Baratta MRN: [id number] DOB: 14-Oct-1955 Today's Date: 06/09/2018    History of Present Illness      PT Comments    Hold per glucose 505   Follow Up Recommendations        Equipment Recommendations       Recommendations for Other Services       Precautions / Restrictions      Mobility  Bed Mobility                  Transfers                    Ambulation/Gait                 Stairs             Wheelchair Mobility    Modified Rankin (Stroke Patients Only)       Balance                                            Cognition                                              Exercises      General Comments        Pertinent Vitals/Pain      Home Living                      Prior Function            PT Goals (current goals can now be found in the care plan section)      Frequency           PT Plan      Co-evaluation              AM-PAC PT "6 Clicks" Mobility   Outcome Measure                   End of Session               Time:  -     Charges:                        Staci Acostahelsea Miller PT, DPT   Laury AxonKristen H Ivannia Willhelm 06/09/2018, 11:25 AM

## 2018-06-09 NOTE — Progress Notes (Addendum)
Inpatient Diabetes Program Recommendations  AACE/ADA: New Consensus Statement on Inpatient Glycemic Control (2015)  Target Ranges:  Prepandial:   less than 140 mg/dL      Peak postprandial:   less than 180 mg/dL (1-2 hours)      Critically ill patients:  140 - 180 mg/dL   Results for April [name], April [name] (MRN [id number]) as of 06/09/2018 12:12  Ref. Range 06/08/2018 08:02 06/08/2018 09:47 06/08/2018 11:33 06/08/2018 16:45 06/08/2018 21:16  Glucose-Capillary Latest Ref Range: 70 - 99 mg/dL 478376 (H) 295389 (H)  9 units NOVOLOG +  16 units LEVEMIR at 10am  440 (H)  20 units NOVOLOG  330 (H)  7 units NOVOLOG  293 (H)  3 units NOVOLOG    Results for April [name], April [name] (MRN [id number]) as of 06/09/2018 12:12  Ref. Range 06/09/2018 07:56 06/09/2018 12:06  Glucose-Capillary Latest Ref Range: 70 - 99 mg/dL 846255 (H)  5 units NOVOLOG +  20 units LEVEMIR at 9:35am  266 (H)  3 units NOVOLOG     Home DM meds: No DM meds on home med list; however at last hospital admission was taking Levemir 10 units QHS, Novolog 7 units TID with meals, Glipizide 5 mg daily   Current Orders: Levemir 20 units Daily      Novolog Sensitive Correction Scale/ SSI (0-9 units) TID AC + HS      Novolog 3 units TID with meals     Solumedrol reduced to 40 mg daily yesterday.  CBGs still >200 mg/dl.  Note that Levemir increased to 20 units Daily today.    MD- Please consider the following in-hospital insulin adjustments while patient remains on steroids:  Increase Novolog Meal Coverage to: Novolog 6 units TID with meals  (Please add the following Hold Parameters: Hold if pt eats <50% of meal, Hold if pt NPO)     --Will follow patient during hospitalization--  Ambrose FinlandJeannine Johnston Rendon Howell RN, MSN, CDE Diabetes Coordinator Inpatient Glycemic Control Team Team Pager: [phone] (8a-5p)

## 2018-06-09 NOTE — Care Management Important Message (Signed)
Important Message  Patient Details  Name: April [name] MRN: [id number] Date of Birth: 1955-07-27   Medicare Important Message Given:  Yes    April GreetBrenda S Azaliyah Kennard, RN 06/09/2018, 10:49 AM

## 2018-06-10 ENCOUNTER — Inpatient Hospital Stay: Payer: Medicare HMO

## 2018-06-10 ENCOUNTER — Inpatient Hospital Stay
Admit: 2018-06-10 | Discharge: 2018-06-10 | Disposition: A | Discharge status: Home / Self Care | Payer: Medicare HMO | Attending: Internal Medicine | Admitting: Internal Medicine

## 2018-06-10 LAB — CBC
HCT: 32.8 % — ABNORMAL LOW (ref 36.0–46.0)
Hemoglobin: 9.9 g/dL — ABNORMAL LOW (ref 12.0–15.0)
MCH: 29.8 pg (ref 26.0–34.0)
MCHC: 30.2 g/dL (ref 30.0–36.0)
MCV: 98.8 fL (ref 80.0–100.0)
Platelets: 313 10*3/uL (ref 150–400)
RBC: 3.32 MIL/uL — ABNORMAL LOW (ref 3.87–5.11)
RDW: 14.4 % (ref 11.5–15.5)
WBC: 10.5 10*3/uL (ref 4.0–10.5)
nRBC: 0 % (ref 0.0–0.2)

## 2018-06-10 LAB — GLUCOSE, CAPILLARY
Glucose-Capillary: 177 mg/dL — ABNORMAL HIGH (ref 70–99)
Glucose-Capillary: 174 mg/dL — ABNORMAL HIGH (ref 70–99)
Glucose-Capillary: 287 mg/dL — ABNORMAL HIGH (ref 70–99)
Glucose-Capillary: 303 mg/dL — ABNORMAL HIGH (ref 70–99)
Glucose-Capillary: 188 mg/dL — ABNORMAL HIGH (ref 70–99)

## 2018-06-10 LAB — BASIC METABOLIC PANEL
Anion gap: 4 — ABNORMAL LOW (ref 5–15)
BUN: 27 mg/dL — AB (ref 8–23)
CO2: 35 mmol/L — AB (ref 22–32)
Calcium: 8.2 mg/dL — ABNORMAL LOW (ref 8.9–10.3)
Chloride: 99 mmol/L (ref 98–111)
Creatinine, Ser: 0.74 mg/dL (ref 0.44–1.00)
GFR calc Af Amer: >60 mL/min (ref >60)
GFR calc non Af Amer: >60 mL/min (ref >60)
GLUCOSE: 211 mg/dL — AB (ref 70–99)
Potassium: 4.6 mmol/L (ref 3.5–5.1)
Sodium: 138 mmol/L (ref 135–145)

## 2018-06-10 LAB — HEPATIC FUNCTION PANEL
ALT: 28 U/L (ref 0–44)
AST: 23 U/L (ref 15–41)
Albumin: 2.5 g/dL — ABNORMAL LOW (ref 3.5–5.0)
Alkaline Phosphatase: 149 U/L — ABNORMAL HIGH (ref 38–126)
BILIRUBIN TOTAL: 0.7 mg/dL (ref 0.3–1.2)
Bilirubin, Direct: 0.2 mg/dL (ref 0.0–0.2)
Indirect Bilirubin: 0.5 mg/dL (ref 0.3–0.9)
Total Protein: 5.9 g/dL — ABNORMAL LOW (ref 6.5–8.1)

## 2018-06-10 LAB — GROUP A STREP BY PCR: Group A Strep by PCR: NOT DETECTED

## 2018-06-10 LAB — LACTIC ACID, PLASMA: Lactic Acid, Venous: 2 mmol/L (ref 0.5–1.9)

## 2018-06-10 LAB — BRAIN NATRIURETIC PEPTIDE: B Natriuretic Peptide: 811 pg/mL — ABNORMAL HIGH (ref 0.0–100.0)

## 2018-06-10 LAB — TROPONIN I: TROPONIN I: 0.09 ng/mL — AB (ref <0.03)

## 2018-06-10 LAB — PROCALCITONIN: Procalcitonin: <0.1 ng/mL

## 2018-06-10 MED ORDER — IPRATROPIUM-ALBUTEROL 0.5-2.5 (3) MG/3ML IN SOLN
3.0000 mL | Freq: Four times a day (QID) | RESPIRATORY_TRACT | Status: DC
  Administered 2018-06-10 – 2018-06-13 (×9): 3 mL via RESPIRATORY_TRACT
  Filled 2018-06-10 (×12): qty 3

## 2018-06-10 MED ORDER — ALPRAZOLAM 0.5 MG PO TABS
0.5000 mg | ORAL_TABLET | Freq: Three times a day (TID) | ORAL | Status: DC | PRN
  Administered 2018-06-10 – 2018-06-12 (×3): 0.5 mg via ORAL
  Filled 2018-06-10 (×3): qty 1

## 2018-06-10 MED ORDER — IPRATROPIUM-ALBUTEROL 0.5-2.5 (3) MG/3ML IN SOLN
3.0000 mL | RESPIRATORY_TRACT | Status: DC | PRN
  Administered 2018-06-10: 06:00:00 3 mL via RESPIRATORY_TRACT
  Filled 2018-06-10: qty 3

## 2018-06-10 MED ORDER — BISOPROLOL FUMARATE 5 MG PO TABS
5.0000 mg | ORAL_TABLET | Freq: Every day | ORAL | Status: DC
  Administered 2018-06-10 – 2018-06-13 (×4): 5 mg via ORAL
  Filled 2018-06-10 (×4): qty 1

## 2018-06-10 MED ORDER — FUROSEMIDE 10 MG/ML IJ SOLN
40.0000 mg | Freq: Two times a day (BID) | INTRAMUSCULAR | Status: DC
  Administered 2018-06-10 – 2018-06-11 (×3): 40 mg via INTRAVENOUS
  Filled 2018-06-10 (×3): qty 4

## 2018-06-10 MED ORDER — ALPRAZOLAM 0.25 MG PO TABS: 0.2500 mg | ORAL_TABLET | Freq: Three times a day (TID) | ORAL | Status: DC | PRN

## 2018-06-10 MED ORDER — ORAL CARE MOUTH RINSE
15.0000 mL | Freq: Two times a day (BID) | OROMUCOSAL | Status: DC
  Administered 2018-06-10 – 2018-06-13 (×6): 15 mL via OROMUCOSAL

## 2018-06-10 NOTE — Progress Notes (Signed)
Patient ID: April [name] Gurganus, female   DOB: 27-Apr-1956, 62 y.o.   MRN: [id number]   [facility] PROGRESS NOTE  April [name] [place] WUJ:[id number] DOB: 27-Apr-1956 DOA: 06/03/2018 PCP: Mickel FuchsWroth, Thomas H, MD  HPI/Subjective: Patient had a rough night.  Not feeling well at all.  Still very short of breath.  Some cough and wheeze.  Objective: Vitals:   06/10/18 0501 06/10/18 0503  BP:    Pulse: (!) 125 (!) 125  Resp: (!) 38   Temp:    SpO2: 98% 95%    Filed Weights   06/08/18 0500 06/09/18 0356 06/10/18 0441  Weight: 52.5 kg 47.7 kg 47.6 kg    ROS: Review of Systems  Constitutional: Negative for chills and fever.  Eyes: Negative for blurred vision.  Respiratory: Positive for cough, shortness of breath and wheezing.   Cardiovascular: Negative for chest pain.  Gastrointestinal: Negative for abdominal pain, constipation, diarrhea, nausea and vomiting.  Genitourinary: Negative for dysuria.  Musculoskeletal: Positive for joint pain.  Neurological: Negative for dizziness and headaches.   Exam: Physical Exam  Constitutional: She is oriented to person, place, and time.  HENT:  Nose: No mucosal edema.  Mouth/Throat: No oropharyngeal exudate or posterior oropharyngeal edema.  Eyes: Pupils are equal, round, and reactive to light. Conjunctivae, EOM and lids are normal.  Neck: No JVD present. Carotid bruit is not present. No edema present. No thyroid mass and no thyromegaly present.  Cardiovascular: S1 normal and S2 normal. Exam reveals no gallop.  No murmur heard. Pulses:      Dorsalis pedis pulses are 2+ on the right side, and 2+ on the left side.  Respiratory: No respiratory distress. She has decreased breath sounds in the right middle field, the right lower field, the left middle field and the left lower field. She has wheezes in the right middle field and the left lower field. She has rhonchi in the right lower field and the left lower field. She has no rales.  GI: Soft. Bowel sounds  are normal. There is no tenderness.  Musculoskeletal:       Right hip: She exhibits swelling.       Left hip: She exhibits swelling.       Right ankle: She exhibits swelling.       Left ankle: She exhibits swelling.  Lymphadenopathy:    She has no cervical adenopathy.  Neurological: She is alert and oriented to person, place, and time. No cranial nerve deficit.  Skin: Skin is warm. Nails show no clubbing.  Bilateral ankles covered with bandages  Psychiatric: She has a normal mood and affect.      Data Reviewed: Basic Metabolic Panel: Recent Labs  Lab 06/04/18 0554 06/04/18 1451 06/05/18 0443 06/06/18 0408 06/08/18 1225 06/10/18 0442  NA 138  --  138 139  --  138  K 5.6* 5.3* 5.0 4.6  --  4.6  CL 107  --  106 108  --  99  CO2 27  --  25 27  --  35*  GLUCOSE 269*  --  91 156* 505* 211*  BUN 52*  --  55* 49*  --  27*  CREATININE 0.90  --  0.84 0.84  --  0.74  CALCIUM 7.7*  --  8.1* 8.1*  --  8.2*   Liver Function Tests: Recent Labs  Lab 06/10/18 0621  AST 23  ALT 28  ALKPHOS 149*  BILITOT 0.7  PROT 5.9*  ALBUMIN 2.5*   CBC: Recent Labs  Lab 06/04/18 0554 06/05/18 0443 06/08/18 0507 06/10/18 0442  WBC 14.2* 15.7* 9.4 10.5  HGB 7.3* 7.5* 8.6* 9.9*  HCT 24.2* 25.0* 27.8* 32.8*  MCV 99.2 100.8* 96.9 98.8  PLT 277 279 280 313   Cardiac Enzymes: Recent Labs  Lab 06/03/18 1554 06/10/18 0621  TROPONINI 0.37* 0.09*    CBG: Recent Labs  Lab 06/09/18 1700 06/09/18 2131 06/10/18 0514 06/10/18 0737 06/10/18 1148  GLUCAP 173* 140* 177* 174* 287*    Recent Results (from the past 240 hour(s))  Culture, blood (routine x 2)     Status: None   Collection Time: 06/03/18  2:19 AM  Result Value Ref Range Status   Specimen Description BLOOD LEFT ANTECUBITAL  Final   Special Requests   Final    BOTTLES DRAWN AEROBIC AND ANAEROBIC Blood Culture results may not be optimal due to an excessive volume of blood received in culture bottles   Culture   Final    NO  GROWTH 5 DAYS Performed at [facility], [address] [place], [place] [address]    Report Status 06/08/2018 FINAL  Final  Culture, blood (routine x 2)     Status: None   Collection Time: 06/03/18  3:45 AM  Result Value Ref Range Status   Specimen Description BLOOD LEFT HAND  Final   Special Requests   Final    BOTTLES DRAWN AEROBIC ONLY Blood Culture results may not be optimal due to an inadequate volume of blood received in culture bottles   Culture   Final    NO GROWTH 5 DAYS Performed at [facility], [address] [place], [place] [address]    Report Status 06/08/2018 FINAL  Final  Urine culture     Status: Abnormal   Collection Time: 06/03/18  4:36 AM  Result Value Ref Range Status   Specimen Description   Final    URINE, RANDOM Performed at [facility], [address] [place], [place] [address]    Special Requests   Final    NONE Performed at [facility], [address] [place], [place] [address]    Culture (A)  Final    40,000 COLONIES/mL ESCHERICHIA COLI 70,000 COLONIES/mL ENTEROCOCCUS FAECALIS    Report Status 06/05/2018 FINAL  Final   Organism ID, Bacteria ESCHERICHIA COLI (A)  Final   Organism ID, Bacteria ENTEROCOCCUS FAECALIS (A)  Final      Susceptibility   Escherichia coli - MIC*    AMPICILLIN <=2 SENSITIVE Sensitive     CEFAZOLIN <=4 SENSITIVE Sensitive     CEFTRIAXONE <=1 SENSITIVE Sensitive     CIPROFLOXACIN <=0.25 SENSITIVE Sensitive     GENTAMICIN <=1 SENSITIVE Sensitive     IMIPENEM <=0.25 SENSITIVE Sensitive     NITROFURANTOIN <=16 SENSITIVE Sensitive     TRIMETH/SULFA <=20 SENSITIVE Sensitive     AMPICILLIN/SULBACTAM <=2 SENSITIVE Sensitive     PIP/TAZO <=4 SENSITIVE Sensitive     Extended ESBL NEGATIVE Sensitive     * 40,000 COLONIES/mL ESCHERICHIA COLI   Enterococcus faecalis - MIC*    AMPICILLIN <=2 SENSITIVE Sensitive     LEVOFLOXACIN 2 SENSITIVE Sensitive     NITROFURANTOIN <=16 SENSITIVE  Sensitive     VANCOMYCIN 1 SENSITIVE Sensitive     * 70,000 COLONIES/mL ENTEROCOCCUS FAECALIS  MRSA PCR Screening     Status: None   Collection Time: 06/03/18  6:26 AM  Result Value Ref Range Status   MRSA by PCR NEGATIVE NEGATIVE Final  Comment:        The GeneXpert MRSA Assay (FDA approved for NASAL specimens only), is one component of a comprehensive MRSA colonization surveillance program. It is not intended to diagnose MRSA infection nor to guide or monitor treatment for MRSA infections. Performed at [facility], [address] [place], [place] [address]   Group A Strep by PCR     Status: None   Collection Time: 06/10/18  3:51 AM  Result Value Ref Range Status   Group A Strep by PCR NOT DETECTED NOT DETECTED Final    Comment: Performed at [facility], [address] [place], [place] [address]     Studies: Dg Chest Port 1 View  Result Date: 06/10/2018 CLINICAL DATA:  62 year old female with hypoxemia. EXAM: PORTABLE CHEST 1 VIEW COMPARISON:  Chest CT dated 06/07/2018 FINDINGS: There is mild interstitial edema, small to moderate bilateral pleural effusions, and bibasilar compressive atelectasis. Pneumonia is not excluded. Clinical correlation is recommended. No pneumothorax. The cardiac silhouette is within normal limits. Atherosclerotic calcification of the aortic arch. No acute osseous pathology. IMPRESSION: Mild interstitial edema with small to moderate bilateral pleural effusions. Electronically Signed   By: Elgie Collard M.D.   On: 06/10/2018 05:57    Scheduled Meds: . amoxicillin  500 mg Oral Q8H  . aspirin  81 mg Oral Daily  . atorvastatin  40 mg Oral Daily  . bisoprolol  5 mg Oral Daily  . budesonide (PULMICORT) nebulizer solution  0.5 mg Nebulization BID  . collagenase  1 application Topical Daily  . docusate sodium  100 mg Oral BID  . furosemide  40 mg Intravenous BID  . insulin aspart  0-5 Units Subcutaneous QHS  . insulin aspart   0-9 Units Subcutaneous TID WC  . insulin aspart  6 Units Subcutaneous TID WC  . insulin detemir  20 Units Subcutaneous Daily  . ipratropium-albuterol  3 mL Nebulization Q6H  . mouth rinse  15 mL Mouth Rinse BID  . menthol-cetylpyridinium  1 lozenge Oral Q6H  . multivitamin-lutein  1 capsule Oral Daily  . pantoprazole  40 mg Oral BID  . polyethylene glycol  17 g Oral Daily  . potassium chloride  10 mEq Oral BID  . protein supplement shake  11 oz Oral BID BM  . rivaroxaban  10 mg Oral QPM  . vitamin C  500 mg Oral BID   Continuous Infusions:  Assessment/Plan:  1. Acute hypoxic respiratory failure.  With worsening respiratory status.  CODE STATUS discussed.  Patient wanted to think about things a little bit further and talk with her family.  Currently a full code. 2. Acute CHF and edema.  IV Lasix increased to twice daily.  Check an echocardiogram 3. COPD exacerbation.  Continue nebulizer treatments and stop Solu-Medrol at this point. 4. Urinary retention.  CT scan showing bladder distention and bladder ultrasound with distention.  Foley catheter. 5. Acute cystitis with enterococcus and E. coli.  Switched over to amoxicillin 6. Abdominal swelling and leg swelling.  Continue Lasix 7. Multiple pulmonary nodules.  Stable from previous CAT scan. 8. Peripheral vascular disease on Xarelto 9. Type 2 diabetes with neuropathy on increased dose Levemir and sliding scale plus standing though short acting insulin prior to meals 10. GERD on Protonix 11. Anemia of chronic disease 12. Elevated troponin secondary to demand ischemia 13. Left heel full-thickness ulceration as per podiatry 14. Stable postsurgical changes of the right first metatarsal as per podiatry 15. Weakness physical therapy evaluation.  Toe-touch left foot and heel touch right foot  Code Status:     Code Status Orders  (From admission, onward)         Start     Ordered   06/03/18 0622  Full code  Continuous     06/03/18  0621        Code Status History    Date Active Date Inactive Code Status Order ID Comments User Context   05/23/2018 0253 06/01/2018 2158 Full Code [id number]  Cammy Copa, MD Inpatient   05/23/2018 0138 05/23/2018 0253 DNR [id number]  Cammy Copa, MD ED   04/10/2018 1111 04/11/2018 1554 DNR [id number]  Milagros Loll, MD Inpatient   04/09/2018 1519 04/10/2018 1111 Full Code [id number]  Milagros Loll, MD Inpatient   04/09/2018 1337 04/09/2018 1519 DNR [id number]  Milagros Loll, MD Inpatient   04/09/2018 0012 04/09/2018 1337 Full Code [id number]  Oralia Manis, MD ED     Family Communication: Spoke with Radene Knee at the bedside and also daughter at the bedside Disposition Plan: To be determined  Antibiotics:  Amoxicillin  Time spent: 35 minutes.  Including ACP time  [employer]

## 2018-06-10 NOTE — Evaluation (Signed)
Physical Therapy Evaluation Patient Details Name: April [name] MRN: [id number] DOB: [birth year] Today's Date: 06/10/2018   History of Present Illness  Pt is a 62 y.o. female presenting to hospital 06/03/18 with respiratory distress (pt with discharge home from hospital 2 days prior following 10 day admit for sepsis, PAD, and B LE nonhealing ulcers; during prior hospitalization pt s/p BLE revascularization procedures, incision and drainage of deep abscess and removal of sesamoid bones and first metatarsal head from the first metatarsal phalangeal joint region of the right foot, and excisional debridement of decubitus ulcer of the left heel; pt with WB'ing precautions upon hospital discharge).  Pt now admitted for respiratory failure, sepsis, UTI, and COPD exacerbation.  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  Clinical Impression  Prior to hospital admission, pt's friend (who lives with pt) and granddaughter reported they were lifting/carrying pt d/t B LE WB'ing precautions (do not have w/c at home).  Pt lives in 1 level apt with 4 steps to enter with B railings.  Pt agreeable to PT session and requesting to get into chair.  Currently pt is modified independent semi-supine to sit; CGA for lateral scooting edge of bed; and min to mod assist to perform lateral scoot bed to recliner to R (L armrest lowered for lateral scoot in order to maintain B LE WB'ing precautions).  Pt denying any pain during session.  Pt's O2 sat's (all on 2 L via nasal cannula during session) 94% at rest beginning of session; decreased to 86% with transfer to chair; but within a few minutes improved to 93% with sitting rest.   Pt would benefit from skilled PT to address noted impairments and functional limitations (see below for any additional details).  Upon hospital discharge, recommend pt discharge to home w/c level with 24/7 assist (pt's friend that lives with her and pt's  granddaughter report pt has physical assist required for safe discharge home but would like w/c to improve safe mobility).   Patient suffers from B LE nonhealing ulcers with WB'ing precautions which impairs her ability to perform daily activities like toileting, dressing, and bathing in the home. A cane, walker, and/or crutch will not resolve the patient's issue with performing activities of daily living. A wheelchair is required/recommended and will allow patient to safely perform daily activities.  Patient has a caregiver who can provide assistance.      Follow Up Recommendations Home health PT;Supervision/Assistance - 24 hour    Equipment Recommendations  3in1 (PT);Wheelchair (measurements PT);Wheelchair cushion (measurements PT)    Recommendations for Other Services OT consult     Precautions / Restrictions Precautions Precautions: Fall Restrictions Weight Bearing Restrictions: Yes Other Position/Activity Restrictions: Heel touch R foot;  Toe touch L foot      Mobility  Bed Mobility Overal bed mobility: Modified Independent             General bed mobility comments: Mild increased effort and time to perform on own; HOB elevated  Transfers Overall transfer level: Needs assistance Equipment used: None Transfers: Lateral/Scoot Transfers          Lateral/Scoot Transfers: Min assist;Mod assist General transfer comment: vc's for B LE WB'ing status; vc's for technique scooting L/R on edge of bed (CGA) and then transfer bed to recliner (L armrest lowered for lateral scoot to R) with min to mod assist  Ambulation/Gait             General Gait Details:  Deferred d/t B LE WB'ing precautions (also per recent admission; surgeon requesting no ambulation)  Stairs            Wheelchair Mobility    Modified Rankin (Stroke Patients Only)       Balance Overall balance assessment: Needs assistance Sitting-balance support: No upper extremity supported;Feet  unsupported Sitting balance-Leahy Scale: Good Sitting balance - Comments: steady sitting reaching within BOS       Standing balance comment: Deferred d/t WB'ing precautions                             Pertinent Vitals/Pain Pain Assessment: No/denies pain  HR 110-116 bpm during session.    Home Living Family/patient expects to be discharged to:: Private residence Living Arrangements: Non-relatives/Friends Available Help at Discharge: Friend(s);Family;Available 24 hours/day Type of Home: Apartment Home Access: Stairs to enter Entrance Stairs-Rails: Right;Left;Can reach both Entrance Stairs-Number of Steps: 4 Home Layout: One level Home Equipment: Walker - 2 wheels;Cane - single point;Bedside commode      Prior Function Level of Independence: Needs assistance   Gait / Transfers Assistance Needed: Pt's friend (that she lives with) and granddaughter have been carrying pt since recent hospital discharge; prior to recent hospitalizations, pt was modified independent ambulating with RW  ADL's / Homemaking Assistance Needed: Prior to recent hospitalizations, pt occasionally required assistance from her granddaughter for ADLs.  Pt has required increased assist with ADL's since recent discharge home.        Hand Dominance        Extremity/Trunk Assessment   Upper Extremity Assessment Upper Extremity Assessment: Generalized weakness    Lower Extremity Assessment Lower Extremity Assessment: Generalized weakness(at least 3/5 AROM B DF/PF, knee flexion/extension, and B hip flexion)    Cervical / Trunk Assessment Cervical / Trunk Assessment: Normal  Communication   Communication: HOH  Cognition Arousal/Alertness: Awake/alert Behavior During Therapy: WFL for tasks assessed/performed Overall Cognitive Status: Within Functional Limits for tasks assessed                                        General Comments General comments (skin integrity, edema,  etc.): Bandages noted B feet.  Nursing cleared pt for participation in physical therapy.  Pt agreeable to PT session.  Pt's family and roommate present end of session.    Exercises  Transfer training   Assessment/Plan    PT Assessment Patient needs continued PT services  PT Problem List Decreased strength;Decreased activity tolerance;Decreased balance;Decreased mobility;Decreased knowledge of use of DME;Decreased knowledge of precautions;Cardiopulmonary status limiting activity;Decreased skin integrity       PT Treatment Interventions DME instruction;Functional mobility training;Therapeutic activities;Therapeutic exercise;Balance training;Patient/family education;Wheelchair mobility training    PT Goals (Current goals can be found in the Care Plan section)  Acute Rehab PT Goals Patient Stated Goal: Improve breathing PT Goal Formulation: With patient Time For Goal Achievement: 06/24/18 Potential to Achieve Goals: Fair    Frequency Min 2X/week   Barriers to discharge   Pt's friend (who lives with her) and granddaughter present during session and report pt has physical assist required for safe home discharge.    Co-evaluation               AM-PAC PT "6 Clicks" Mobility  Outcome Measure Help needed turning from your back to your side while in a flat bed without using bedrails?:  None Help needed moving from lying on your back to sitting on the side of a flat bed without using bedrails?: None Help needed moving to and from a bed to a chair (including a wheelchair)?: A Lot Help needed standing up from a chair using your arms (e.g., wheelchair or bedside chair)?: A Lot Help needed to walk in hospital room?: Total Help needed climbing 3-5 steps with a railing? : Total 6 Click Score: 14    End of Session Equipment Utilized During Treatment: Gait belt Activity Tolerance: Other (comment);Patient limited by fatigue(Limited d/t SOB with activity) Patient left: in chair;with call  bell/phone within reach;with chair alarm set;with family/visitor present;with nursing/sitter in room;Other (comment)(B heels elevated via pillow) Nurse Communication: Mobility status;Need for lift equipment;Precautions;Weight bearing status PT Visit Diagnosis: Other abnormalities of gait and mobility (R26.89);Muscle weakness (generalized) (M62.81)    Time: [id number] PT Time Calculation (min) (ACUTE ONLY): 44 min   Charges:   PT Evaluation $PT Eval Low Complexity: 1 Low PT Treatments $Therapeutic Exercise: 8-22 mins $Therapeutic Activity: 8-22 mins       Hendricks LimesEmily Tamy Accardo, PT 06/10/18, 3:02 PM [phone]

## 2018-06-10 NOTE — Progress Notes (Signed)
Patient ID: [name], female   DOB: 1955-12-28, 62 y.o.   MRN: [id number]  ACP note.  Patient, April [name] and daughter at the bedside  Diagnosis: Acute hypoxic respiratory failure, acute CHF, COPD exacerbation, urinary retention, acute cystitis, abdominal swelling, pulmonary nodules, peripheral vascular disease, type 2 diabetes, GERD, anemia of chronic disease, left heel full-thickness ulceration, postsurgical changes right first metatarsal  CODE STATUS discussed.  Patient is a full code currently.  Thinking about things and wants to think things over with family at this point.  Plan.  Increase IV diuresis with Lasix 40 mg IV twice daily.  Continue to monitor closely.  Check echocardiogram.  Add bisoprolol.  Telemetry monitoring  Time spent on ACP discussion 17 minutes Dr. Alford Highlandichard Jocelynne Duquette

## 2018-06-10 NOTE — Progress Notes (Signed)
Called into room by pnts daughter at roughly 5am due to patient having increased shortness of breath. Obtained vital signs, although O2 saturation was sufficient on 2L, pnt was tachycardic and tachypneic. Notified Dr. Marjie SkiffSridharan who put in new orders. Labs done, breathing treatment given, repositioned in bed, chest xray completed at bedside, and incentive spirometer provided;awaiting lab values.   Pnt does report feeling slightly better after breathing treatment but is saying she does "not feel right". Lungs sounds diminished with some rhonchi. Gave report on pnts change overnight. Provided support to both pnt and daughter at bedside. Did ask for anxiety med to given patient but no new orders at that time.

## 2018-06-11 ENCOUNTER — Inpatient Hospital Stay: Payer: Medicare HMO

## 2018-06-11 LAB — GLUCOSE, CAPILLARY
Glucose-Capillary: 116 mg/dL — ABNORMAL HIGH (ref 70–99)
Glucose-Capillary: 145 mg/dL — ABNORMAL HIGH (ref 70–99)
Glucose-Capillary: 18 mg/dL — CL (ref 70–99)
Glucose-Capillary: 19 mg/dL — CL (ref 70–99)
Glucose-Capillary: 37 mg/dL — CL (ref 70–99)
Glucose-Capillary: 40 mg/dL — CL (ref 70–99)
Glucose-Capillary: 47 mg/dL — ABNORMAL LOW (ref 70–99)
Glucose-Capillary: 150 mg/dL — ABNORMAL HIGH (ref 70–99)
Glucose-Capillary: 196 mg/dL — ABNORMAL HIGH (ref 70–99)
Glucose-Capillary: 140 mg/dL — ABNORMAL HIGH (ref 70–99)

## 2018-06-11 LAB — ECHOCARDIOGRAM COMPLETE
Height: 62 in
Weight: 1679.02 oz

## 2018-06-11 LAB — CALCIUM, IONIZED: Calcium, Ionized, Serum: 4.9 mg/dL (ref 4.5–5.6)

## 2018-06-11 MED ORDER — DEXTROSE 50 % IV SOLN
INTRAVENOUS | Status: AC
  Administered 2018-06-11: 18:00:00
  Filled 2018-06-11: qty 100

## 2018-06-11 MED ORDER — DEXTROSE 50 % IV SOLN
INTRAVENOUS | Status: AC
  Administered 2018-06-11: 17:00:00
  Filled 2018-06-11: qty 50

## 2018-06-11 MED ORDER — FUROSEMIDE 10 MG/ML IJ SOLN
40.0000 mg | Freq: Every day | INTRAMUSCULAR | Status: DC
  Administered 2018-06-12 – 2018-06-13 (×2): 40 mg via INTRAVENOUS
  Filled 2018-06-11 (×2): qty 4

## 2018-06-11 MED ORDER — DEXTROSE 10 % IV SOLN
INTRAVENOUS | Status: DC
  Administered 2018-06-11: 21:00:00 via INTRAVENOUS

## 2018-06-11 MED ORDER — INSULIN DETEMIR 100 UNIT/ML ~~LOC~~ SOLN
16.0000 [IU] | Freq: Every day | SUBCUTANEOUS | Status: DC
  Filled 2018-06-11: qty 0.16

## 2018-06-11 MED ORDER — GLUCAGON HCL RDNA (DIAGNOSTIC) 1 MG IJ SOLR
INTRAMUSCULAR | Status: AC
  Administered 2018-06-11: 18:00:00
  Filled 2018-06-11: qty 1

## 2018-06-11 MED ORDER — GLUCOSE 40 % PO GEL
ORAL | Status: AC
  Administered 2018-06-11: 18:00:00
  Filled 2018-06-11: qty 1

## 2018-06-11 NOTE — Progress Notes (Signed)
Patient ID: April [name], female   DOB: [birth year], 62 y.o.   MRN: [id number]   [employer] [facility] PROGRESS NOTE  SYNA GAD WUJ:[id number] DOB: 1956-05-28 DOA: 06/03/2018 PCP: Mickel Fuchs, MD  HPI/Subjective: Asked by Dr. Elisabeth Pigeon  to see the patient secondary to low sugar of 12 and unresponsiveness.  When I got there the patient had lost IV access the last IV got infiltrated.  Objective: Vitals:   06/11/18 0726 06/11/18 1245  BP:  (!) 86/42  Pulse:  86  Resp:  18  Temp:  98.5 F (36.9 C)  SpO2: 100% 100%    Filed Weights   06/09/18 0356 06/10/18 0441 06/11/18 0449  Weight: 47.7 kg 47.6 kg 44.7 kg    ROS: Review of Systems  Unable to perform ROS: Acuity of condition   Exam: Physical Exam  Constitutional: She appears lethargic.  HENT:  Nose: No mucosal edema.  Mouth/Throat: No oropharyngeal exudate or posterior oropharyngeal edema.  Eyes: Pupils are equal, round, and reactive to light. Conjunctivae, EOM and lids are normal.  Neck: No JVD present. Carotid bruit is not present. No edema present. No thyroid mass and no thyromegaly present.  Cardiovascular: S1 normal and S2 normal. Exam reveals no gallop.  No murmur heard. Respiratory: No respiratory distress. She has decreased breath sounds in the right middle field, the right lower field, the left middle field and the left lower field. She has wheezes in the right middle field and the left lower field. She has rhonchi in the right lower field and the left lower field. She has no rales.  GI: Soft. Bowel sounds are normal. There is no tenderness.  Musculoskeletal:       Right hip: She exhibits swelling.       Left hip: She exhibits swelling.       Right ankle: She exhibits swelling.       Left ankle: She exhibits swelling.  Lymphadenopathy:    She has no cervical adenopathy.  Neurological: She appears lethargic.  Skin: She is diaphoretic. Nails show no clubbing.  Psychiatric:  Unresponsive      Data  Reviewed: Basic Metabolic Panel: Recent Labs  Lab 06/05/18 0443 06/06/18 0408 06/08/18 1225 06/10/18 0442  NA 138 139  --  138  K 5.0 4.6  --  4.6  CL 106 108  --  99  CO2 25 27  --  35*  GLUCOSE 91 156* 505* 211*  BUN 55* 49*  --  27*  CREATININE 0.84 0.84  --  0.74  CALCIUM 8.1* 8.1*  --  8.2*   Liver Function Tests: Recent Labs  Lab 06/10/18 0621  AST 23  ALT 28  ALKPHOS 149*  BILITOT 0.7  PROT 5.9*  ALBUMIN 2.5*   CBC: Recent Labs  Lab 06/05/18 0443 06/08/18 0507 06/10/18 0442  WBC 15.7* 9.4 10.5  HGB 7.5* 8.6* 9.9*  HCT 25.0* 27.8* 32.8*  MCV 100.8* 96.9 98.8  PLT 279 280 313   Cardiac Enzymes: Recent Labs  Lab 06/10/18 0621  TROPONINI 0.09*    CBG: Recent Labs  Lab 06/11/18 1631 06/11/18 1635 06/11/18 1638 06/11/18 1650 06/11/18 1701  GLUCAP 11* 37* 18* 40* 12*    Recent Results (from the past 240 hour(s))  Culture, blood (routine x 2)     Status: None   Collection Time: 06/03/18  2:19 AM  Result Value Ref Range Status   Specimen Description BLOOD LEFT ANTECUBITAL  Final   Special Requests  Final    BOTTLES DRAWN AEROBIC AND ANAEROBIC Blood Culture results may not be optimal due to an excessive volume of blood received in culture bottles   Culture   Final    NO GROWTH 5 DAYS Performed at [facility], [address] [place], [place] [address]    Report Status 06/08/2018 FINAL  Final  Culture, blood (routine x 2)     Status: None   Collection Time: 06/03/18  3:45 AM  Result Value Ref Range Status   Specimen Description BLOOD LEFT HAND  Final   Special Requests   Final    BOTTLES DRAWN AEROBIC ONLY Blood Culture results may not be optimal due to an inadequate volume of blood received in culture bottles   Culture   Final    NO GROWTH 5 DAYS Performed at [facility], [address] [place], [place] [address]    Report Status 06/08/2018 FINAL  Final  Urine culture     Status: Abnormal   Collection Time:  06/03/18  4:36 AM  Result Value Ref Range Status   Specimen Description   Final    URINE, RANDOM Performed at [facility], [address], [place], [place] [address]    Special Requests   Final    NONE Performed at [facility], [address], [place], [place] [address]    Culture (A)  Final    40,000 COLONIES/mL ESCHERICHIA COLI 70,000 COLONIES/mL ENTEROCOCCUS FAECALIS    Report Status 06/05/2018 FINAL  Final   Organism ID, Bacteria ESCHERICHIA COLI (A)  Final   Organism ID, Bacteria ENTEROCOCCUS FAECALIS (A)  Final      Susceptibility   Escherichia coli - MIC*    AMPICILLIN <=2 SENSITIVE Sensitive     CEFAZOLIN <=4 SENSITIVE Sensitive     CEFTRIAXONE <=1 SENSITIVE Sensitive     CIPROFLOXACIN <=0.25 SENSITIVE Sensitive     GENTAMICIN <=1 SENSITIVE Sensitive     IMIPENEM <=0.25 SENSITIVE Sensitive     NITROFURANTOIN <=16 SENSITIVE Sensitive     TRIMETH/SULFA <=20 SENSITIVE Sensitive     AMPICILLIN/SULBACTAM <=2 SENSITIVE Sensitive     PIP/TAZO <=4 SENSITIVE Sensitive     Extended ESBL NEGATIVE Sensitive     * 40,000 COLONIES/mL ESCHERICHIA COLI   Enterococcus faecalis - MIC*    AMPICILLIN <=2 SENSITIVE Sensitive     LEVOFLOXACIN 2 SENSITIVE Sensitive     NITROFURANTOIN <=16 SENSITIVE Sensitive     VANCOMYCIN 1 SENSITIVE Sensitive     * 70,000 COLONIES/mL ENTEROCOCCUS FAECALIS  MRSA PCR Screening     Status: None   Collection Time: 06/03/18  6:26 AM  Result Value Ref Range Status   MRSA by PCR NEGATIVE NEGATIVE Final    Comment:        The GeneXpert MRSA Assay (FDA approved for NASAL specimens only), is one component of a comprehensive MRSA colonization surveillance program. It is not intended to diagnose MRSA infection nor to guide or monitor treatment for MRSA infections. Performed at [facility], [address] [place], [place] [address]   Group A Strep by PCR     Status: None   Collection Time: 06/10/18  3:51 AM  Result Value  Ref Range Status   Group A Strep by PCR NOT DETECTED NOT DETECTED Final    Comment: Performed at [facility], [address], [place], [place] [address]     Studies: Dg Chest Port 1 View  Result Date: 06/10/2018 CLINICAL DATA:  62 year old female with hypoxemia. EXAM: PORTABLE CHEST 1 VIEW COMPARISON:  Chest CT dated 06/07/2018 FINDINGS: There is mild interstitial edema, small to moderate bilateral pleural effusions, and bibasilar compressive atelectasis. Pneumonia is not excluded. Clinical correlation is recommended. No pneumothorax. The cardiac silhouette is within normal limits. Atherosclerotic calcification of the aortic arch. No acute osseous pathology. IMPRESSION: Mild interstitial edema with small to moderate bilateral pleural effusions. Electronically Signed   By: Elgie CollardArash  Radparvar M.D.   On: 06/10/2018 05:57    Scheduled Meds: . dextrose      . dextrose      . dextrose      . dextrose      . glucagon (human recombinant)      . amoxicillin  500 mg Oral Q8H  . aspirin  81 mg Oral Daily  . atorvastatin  40 mg Oral Daily  . bisoprolol  5 mg Oral Daily  . budesonide (PULMICORT) nebulizer solution  0.5 mg Nebulization BID  . collagenase  1 application Topical Daily  . docusate sodium  100 mg Oral BID  . [START ON 06/12/2018] furosemide  40 mg Intravenous Daily  . ipratropium-albuterol  3 mL Nebulization Q6H  . mouth rinse  15 mL Mouth Rinse BID  . multivitamin-lutein  1 capsule Oral Daily  . pantoprazole  40 mg Oral BID  . polyethylene glycol  17 g Oral Daily  . potassium chloride  10 mEq Oral BID  . protein supplement shake  11 oz Oral BID BM  . rivaroxaban  10 mg Oral QPM  . vitamin C  500 mg Oral BID   Continuous Infusions:  Assessment/Plan:  1. Severe hypoglycemia and unresponsiveness.  We did give IM glucagon and Orajel.  We were struggling to get an IV.  The IV team has come now.  We will stop sliding scale and Lantus insulin right now.  Fingersticks q. 2.   Dextrose drip ordered.  Continue to monitor closely.  Code Status:     Code Status Orders  (From admission, onward)         Start     Ordered   06/03/18 0622  Full code  Continuous     06/03/18 0621        Code Status History    Date Active Date Inactive Code Status Order ID Comments User Context   05/23/2018 0253 06/01/2018 2158 Full Code [id number]  Cammy CopaMaier, Angela, MD Inpatient   05/23/2018 0138 05/23/2018 0253 DNR [id number]  Cammy CopaMaier, Angela, MD ED   04/10/2018 1111 04/11/2018 1554 DNR [id number]  Milagros LollSudini, Srikar, MD Inpatient   04/09/2018 1519 04/10/2018 1111 Full Code [id number]  Milagros LollSudini, Srikar, MD Inpatient   04/09/2018 1337 04/09/2018 1519 DNR [id number]  Milagros LollSudini, Srikar, MD Inpatient   04/09/2018 0012 04/09/2018 1337 Full Code [id number]  Oralia ManisWillis, David, MD ED     Family Communication: Spoke with Radene Kneehadwick at the bedside. Disposition Plan: To be determined  Antibiotics:  Amoxicillin  Time spent: 32 minutes at the bedside  [employer] Corporationichard Kaylynn Chamblin  [employer]

## 2018-06-11 NOTE — Progress Notes (Signed)
Rapid response not called but was notified by Annice PihJackie, Nursing supervisor that this patient was having low blood sugars. Patient found unresponsive- blood sugar was reading 13- after 2 amps of D50 but found out that the patient's only IV had infiltrated.  Dr. Hilton SinclairWeiting at the bedside ordering IM glucagon and oral glucagon.  Patient became responsive and talking.  IV team at beside.  Last blood sugar read 40.  RN at bedside- once IV placed- RN will order serum glucose.

## 2018-06-11 NOTE — Progress Notes (Signed)
Initial Nutrition Assessment  DOCUMENTATION CODES:   Severe malnutrition in context of chronic illness, Underweight  INTERVENTION:  Continue Premier Protein po BID, each supplement provides 160 kcal and 30 grams of protein. Patient prefers chocolate flavor.  Continue Ocuvite daily for wound healing (provides zinc, vitamin A, vitamin C, Vitamin E, copper, and selenium).  Continue vitamin C 500 mg BID.  NUTRITION DIAGNOSIS:   Severe Malnutrition related to chronic illness(COPD, CHF, uncontrolled DM) as evidenced by severe fat depletion, severe muscle depletion.  GOAL:   Patient will meet greater than or equal to 90% of their needs  MONITOR:   PO intake, Supplement acceptance, Labs, Weight trends, Skin, I & O's  REASON FOR ASSESSMENT:   Malnutrition Screening Tool    ASSESSMENT:   62 year old female with PMHx of DM type 2, HLD, anxiety, COPD, tobacco abuse, hx DVT, PAD who is admitted with acute hypoxic respiratory failure, acute CHF and edema, acute exacerbation of COPD, urinary retention, acute cystitis, abdominal swelling and leg swelling.   Patient sleeping at time of assessment. History was provided by family member at bedside. Patient is also known to this RD from previous admission. Her appetite and intake remain poor. She sleeps most of the day. She will wake up for meals but only takes in small amounts at meals. She experiences anorexia (lack of hunger) and early satiety. Noted patient was edentulous on exam. Family member reports she is used to eating without dentures in and she does not have any issues with chewing/swallowing. Patient's last BM was 4 days ago. She is on a bowel regimen.  Patient's weight fluctuates from fluid. She was 37.2 kg on 05/24/2018, 41.7 kg on 06/04/2018, and is currently 44.7 kg (98.55 lbs).  Medications reviewed and include: amoxicillin, Colace 100 mg BID, Lasix 40 mg daily IV, Novolog 0-9 units TID, Novolog 0-5 units QHS, Levemir 16 units  daily, Ocuvite daily, pantoprazole, Miralax, potassium chloride 10 mEq BID, vitamin C 500 mg BID.  Labs reviewed: CBG 116-303.  NUTRITION - FOCUSED PHYSICAL EXAM:    Most Recent Value  Orbital Region  Severe depletion  Upper Arm Region  Severe depletion  Thoracic and Lumbar Region  Severe depletion  Buccal Region  Moderate depletion  Temple Region  Severe depletion  Clavicle Bone Region  Severe depletion  Clavicle and Acromion Bone Region  Severe depletion  Scapular Bone Region  Unable to assess  Dorsal Hand  Severe depletion  Patellar Region  Severe depletion  Anterior Thigh Region  Severe depletion  Posterior Calf Region  Severe depletion  Edema (RD Assessment)  -- [non-pitting]  Hair  Reviewed  Eyes  Unable to assess  Mouth  Reviewed [edentulous]  Skin  Reviewed  Nails  Reviewed     Diet Order:   Diet Order            Diet Carb Modified Fluid consistency: Thin; Room service appropriate? Yes  Diet effective now             EDUCATION NEEDS:   Not appropriate for education at this time  Skin:  Skin Assessment: Skin Integrity Issues:(unstageable to left heel; wound to top of right foot)  Last BM:  06/07/2018 - large type 4  Height:   Ht Readings from Last 1 Encounters:  06/04/18 5\' 2"  (1.575 m)   Weight:   Wt Readings from Last 1 Encounters:  06/11/18 44.7 kg   Ideal Body Weight:  50 kg  BMI:  Body mass index is  18.02 kg/m.  Estimated Nutritional Needs:   Kcal:  1200-1400  Protein:  55-65 grams  Fluid:  1.2 L/day  Helane RimaLeanne Saahir Prude, MS, RD, LDN Office: [phone] Pager: [phone] After Hours/Weekend Pager: [phone]

## 2018-06-11 NOTE — Progress Notes (Signed)
Was talking with Futures traderCharge RN Phyllis. She stated patient was potentially going to be a rapid response. Contacted ICU Charge RN Charlie, we both went to room. On arrival was told patient had low blood sugar. Pts RN in room, RT present. Dr. Hilton SinclairWeiting was paged and came to room. Hypoglycemic protocol initiated under rapid response.Pt had been given D50 but IV infiltrated. Several attempts to gain access, without success. Patient given glucagon Im, and oral dextrose gel, patient was able to ingest it. IV team contacted for IV access. Patient became more responsive, followed commands, was speaking.

## 2018-06-12 LAB — BASIC METABOLIC PANEL
Anion gap: 7 (ref 5–15)
BUN: 32 mg/dL — ABNORMAL HIGH (ref 8–23)
CO2: 39 mmol/L — ABNORMAL HIGH (ref 22–32)
CREATININE: 0.69 mg/dL (ref 0.44–1.00)
Calcium: 8 mg/dL — ABNORMAL LOW (ref 8.9–10.3)
Chloride: 93 mmol/L — ABNORMAL LOW (ref 98–111)
GFR calc Af Amer: >60 mL/min (ref >60)
GFR calc non Af Amer: >60 mL/min (ref >60)
Glucose, Bld: 145 mg/dL — ABNORMAL HIGH (ref 70–99)
Potassium: 4.2 mmol/L (ref 3.5–5.1)
Sodium: 139 mmol/L (ref 135–145)

## 2018-06-12 LAB — GLUCOSE, CAPILLARY
Glucose-Capillary: 11 mg/dL — CL (ref 70–99)
Glucose-Capillary: 12 mg/dL — CL (ref 70–99)
Glucose-Capillary: 12 mg/dL — CL (ref 70–99)
Glucose-Capillary: 128 mg/dL — ABNORMAL HIGH (ref 70–99)
Glucose-Capillary: 139 mg/dL — ABNORMAL HIGH (ref 70–99)
Glucose-Capillary: 150 mg/dL — ABNORMAL HIGH (ref 70–99)
Glucose-Capillary: 152 mg/dL — ABNORMAL HIGH (ref 70–99)
Glucose-Capillary: 176 mg/dL — ABNORMAL HIGH (ref 70–99)
Glucose-Capillary: 252 mg/dL — ABNORMAL HIGH (ref 70–99)
Glucose-Capillary: 276 mg/dL — ABNORMAL HIGH (ref 70–99)
Glucose-Capillary: 283 mg/dL — ABNORMAL HIGH (ref 70–99)
Glucose-Capillary: 291 mg/dL — ABNORMAL HIGH (ref 70–99)
Glucose-Capillary: 320 mg/dL — ABNORMAL HIGH (ref 70–99)
Glucose-Capillary: 345 mg/dL — ABNORMAL HIGH (ref 70–99)

## 2018-06-12 MED ORDER — INSULIN ASPART 100 UNIT/ML ~~LOC~~ SOLN
0.0000 [IU] | Freq: Three times a day (TID) | SUBCUTANEOUS | Status: DC
  Administered 2018-06-12: 18:00:00 7 [IU] via SUBCUTANEOUS
  Administered 2018-06-13: 9 [IU] via SUBCUTANEOUS
  Administered 2018-06-13: 3 [IU] via SUBCUTANEOUS
  Administered 2018-06-13: 09:00:00 9 [IU] via SUBCUTANEOUS
  Filled 2018-06-12 (×4): qty 1

## 2018-06-12 MED ORDER — METFORMIN HCL 500 MG PO TABS
500.0000 mg | ORAL_TABLET | Freq: Two times a day (BID) | ORAL | Status: DC
  Administered 2018-06-12 – 2018-06-13 (×3): 500 mg via ORAL
  Filled 2018-06-12 (×4): qty 1

## 2018-06-12 MED ORDER — TAMSULOSIN HCL 0.4 MG PO CAPS
0.4000 mg | ORAL_CAPSULE | Freq: Every day | ORAL | Status: DC
  Administered 2018-06-12 – 2018-06-13 (×2): 0.4 mg via ORAL
  Filled 2018-06-12 (×2): qty 1

## 2018-06-12 NOTE — Progress Notes (Signed)
Physical Therapy Treatment Patient Details Name: April [name] MRN: [id number] DOB: 22-Apr-1956 Today's Date: 06/12/2018    History of Present Illness Pt is a 62 y.o. female presenting to hospital 06/03/18 with respiratory distress (pt with discharge home from hospital 2 days prior following 10 day admit for sepsis, PAD, and B LE nonhealing ulcers; during prior hospitalization pt s/p BLE revascularization procedures, incision and drainage of deep abscess and removal of sesamoid bones and first metatarsal head from the first metatarsal phalangeal joint region of the right foot, and excisional debridement of decubitus ulcer of the left heel; pt with WB'ing precautions upon hospital discharge).  Pt now admitted for respiratory failure, sepsis, UTI, and COPD exacerbation.  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    PT Comments    In bed, agreed on 3rd attempt.  To edge of bed without assist and transferred to drop arm recliner to right with min a x 1.  Verbal cues and education given for weight bearing precautions. Participated in exercises as described below.  Limited by fatigue.  O2 sats 98% on 2 lpm after session.    Follow Up Recommendations  Home health PT;Supervision/Assistance - 24 hour     Equipment Recommendations  3in1 (PT);Wheelchair (measurements PT);Wheelchair cushion (measurements PT)    Recommendations for Other Services       Precautions / Restrictions Precautions Precautions: Fall Precaution Comments: isolation Restrictions Weight Bearing Restrictions: Yes RLE Weight Bearing: Partial weight bearing LLE Weight Bearing: Partial weight bearing Other Position/Activity Restrictions: Heel touch R foot;  Toe touch L foot    Mobility  Bed Mobility Overal bed mobility: Modified Independent                Transfers Overall transfer level: Needs assistance Equipment used: None Transfers: Lateral/Scoot  Transfers          Lateral/Scoot Transfers: Min assist General transfer comment: vc's for B LE WB'ing status; vc's for technique scooting L/R on edge of bed (CGA) and then transfer bed to recliner (L armrest lowered for lateral scoot to R) with min assist  Ambulation/Gait             General Gait Details: Deferred d/t B LE WB'ing precautions (also per recent admission; surgeon requesting no ambulation)   Stairs             Wheelchair Mobility    Modified Rankin (Stroke Patients Only)       Balance Overall balance assessment: Needs assistance Sitting-balance support: No upper extremity supported;Feet unsupported Sitting balance-Leahy Scale: Good Sitting balance - Comments: steady sitting reaching within BOS       Standing balance comment: Deferred d/t WB'ing precautions                            Cognition Arousal/Alertness: Awake/alert Behavior During Therapy: WFL for tasks assessed/performed Overall Cognitive Status: Within Functional Limits for tasks assessed                                        Exercises Other Exercises Other Exercises: BLE unsupported sitting AROM ankle pumps, LAQ and marches    General Comments        Pertinent Vitals/Pain Pain Assessment: No/denies pain    Home Living  Prior Function            PT Goals (current goals can now be found in the care plan section) Progress towards PT goals: Progressing toward goals    Frequency    Min 2X/week      PT Plan Current plan remains appropriate    Co-evaluation              AM-PAC PT "6 Clicks" Mobility   Outcome Measure  Help needed turning from your back to your side while in a flat bed without using bedrails?: None Help needed moving from lying on your back to sitting on the side of a flat bed without using bedrails?: None Help needed moving to and from a bed to a chair (including a wheelchair)?: A  Lot Help needed standing up from a chair using your arms (e.g., wheelchair or bedside chair)?: A Lot Help needed to walk in hospital room?: Total Help needed climbing 3-5 steps with a railing? : Total 6 Click Score: 14    End of Session   Activity Tolerance: Patient tolerated treatment well;Patient limited by fatigue Patient left: in chair;with call bell/phone within reach;with chair alarm set Nurse Communication: Mobility status       Time: 1610-9604 PT Time Calculation (min) (ACUTE ONLY): 12 min  Charges:  $Therapeutic Activity: 8-22 mins                     Danielle Dess, PTA 06/12/18, 11:23 AM

## 2018-06-12 NOTE — Care Management Important Message (Signed)
Important Message  Patient Details  Name: April [name] MRN: [id number] Date of Birth: July 03, 1955   Medicare Important Message Given:  Yes    Gwenette GreetBrenda S Finnbar Cedillos, RN 06/12/2018, 1:37 PM

## 2018-06-12 NOTE — Progress Notes (Signed)
Patient ID: April [name], female   DOB: 04-07-1956, 62 y.o.   MRN: [id number]   [employer] PROGRESS NOTE  April [name] XLK:[id number] DOB: 04-07-1956 DOA: 06/03/2018 PCP: Mickel FuchsWroth, Thomas H, MD  HPI/Subjective: Feels slightly better. Still SOB.  Objective: Vitals:   06/11/18 2040 06/12/18 0357  BP:  (!) 110/52  Pulse:  92  Resp:  19  Temp:  98.8 F (37.1 C)  SpO2: 95% 99%    Filed Weights   06/10/18 0441 06/11/18 0449 06/12/18 0357  Weight: 47.6 kg 44.7 kg 51 kg    ROS: Review of Systems  Constitutional: Negative for chills and fever.  Eyes: Negative for blurred vision.  Respiratory: Positive for cough, shortness of breath and wheezing.   Cardiovascular: Negative for chest pain.  Gastrointestinal: Negative for abdominal pain, constipation, diarrhea, nausea and vomiting.  Genitourinary: Negative for dysuria.  Musculoskeletal: Positive for joint pain.  Neurological: Negative for dizziness and headaches.   Exam: Physical Exam  Constitutional: She is oriented to person, place, and time.  HENT:  Nose: No mucosal edema.  Mouth/Throat: No oropharyngeal exudate or posterior oropharyngeal edema.  Eyes: Pupils are equal, round, and reactive to light. Conjunctivae, EOM and lids are normal.  Neck: No JVD present. Carotid bruit is not present. No edema present. No thyroid mass and no thyromegaly present.  Cardiovascular: S1 normal and S2 normal. Exam reveals no gallop.  No murmur heard. Pulses:      Dorsalis pedis pulses are 2+ on the right side and 2+ on the left side.  Respiratory: No respiratory distress. She has decreased breath sounds in the right middle field, the right lower field, the left middle field and the left lower field. She has wheezes in the right middle field and the left lower field. She has rhonchi in the right lower field and the left lower field. She has no rales.  GI: Soft. Bowel sounds are normal. There is no abdominal tenderness.   Musculoskeletal:     Right ankle: She exhibits swelling.     Left ankle: She exhibits swelling.  Lymphadenopathy:    She has no cervical adenopathy.  Neurological: She is alert and oriented to person, place, and time. No cranial nerve deficit.  Skin: Skin is warm. Nails show no clubbing.  Bilateral ankles covered with bandages  Psychiatric: She has a normal mood and affect.      Data Reviewed: Basic Metabolic Panel: Recent Labs  Lab 06/06/18 0408 06/08/18 1225 06/10/18 0442  NA 139  --  138  K 4.6  --  4.6  CL 108  --  99  CO2 27  --  35*  GLUCOSE 156* 505* 211*  BUN 49*  --  27*  CREATININE 0.84  --  0.74  CALCIUM 8.1*  --  8.2*   Liver Function Tests: Recent Labs  Lab 06/10/18 0621  AST 23  ALT 28  ALKPHOS 149*  BILITOT 0.7  PROT 5.9*  ALBUMIN 2.5*   CBC: Recent Labs  Lab 06/08/18 0507 06/10/18 0442  WBC 9.4 10.5  HGB 8.6* 9.9*  HCT 27.8* 32.8*  MCV 96.9 98.8  PLT 280 313   Cardiac Enzymes: Recent Labs  Lab 06/10/18 0621  TROPONINI 0.09*    CBG: Recent Labs  Lab 06/11/18 1926 06/11/18 2201 06/12/18 0000 06/12/18 0403 06/12/18 0623  GLUCAP 196* 140* 139* 150* 152*    Recent Results (from the past 240 hour(s))  Culture, blood (routine x 2)     Status:  None   Collection Time: 06/03/18  2:19 AM  Result Value Ref Range Status   Specimen Description BLOOD LEFT ANTECUBITAL  Final   Special Requests   Final    BOTTLES DRAWN AEROBIC AND ANAEROBIC Blood Culture results may not be optimal due to an excessive volume of blood received in culture bottles   Culture   Final    NO GROWTH 5 DAYS Performed at [facility], [address] [place], [place] [address]    Report Status 06/08/2018 FINAL  Final  Culture, blood (routine x 2)     Status: None   Collection Time: 06/03/18  3:45 AM  Result Value Ref Range Status   Specimen Description BLOOD LEFT HAND  Final   Special Requests   Final    BOTTLES DRAWN AEROBIC ONLY Blood Culture  results may not be optimal due to an inadequate volume of blood received in culture bottles   Culture   Final    NO GROWTH 5 DAYS Performed at [facility], [address] [place], [place] [address]    Report Status 06/08/2018 FINAL  Final  Urine culture     Status: Abnormal   Collection Time: 06/03/18  4:36 AM  Result Value Ref Range Status   Specimen Description   Final    URINE, RANDOM Performed at [facility], [address], [place], [place] [address]    Special Requests   Final    NONE Performed at [facility], [address], [place], [place] [address]    Culture (A)  Final    40,000 COLONIES/mL ESCHERICHIA COLI 70,000 COLONIES/mL ENTEROCOCCUS FAECALIS    Report Status 06/05/2018 FINAL  Final   Organism ID, Bacteria ESCHERICHIA COLI (A)  Final   Organism ID, Bacteria ENTEROCOCCUS FAECALIS (A)  Final      Susceptibility   Escherichia coli - MIC*    AMPICILLIN <=2 SENSITIVE Sensitive     CEFAZOLIN <=4 SENSITIVE Sensitive     CEFTRIAXONE <=1 SENSITIVE Sensitive     CIPROFLOXACIN <=0.25 SENSITIVE Sensitive     GENTAMICIN <=1 SENSITIVE Sensitive     IMIPENEM <=0.25 SENSITIVE Sensitive     NITROFURANTOIN <=16 SENSITIVE Sensitive     TRIMETH/SULFA <=20 SENSITIVE Sensitive     AMPICILLIN/SULBACTAM <=2 SENSITIVE Sensitive     PIP/TAZO <=4 SENSITIVE Sensitive     Extended ESBL NEGATIVE Sensitive     * 40,000 COLONIES/mL ESCHERICHIA COLI   Enterococcus faecalis - MIC*    AMPICILLIN <=2 SENSITIVE Sensitive     LEVOFLOXACIN 2 SENSITIVE Sensitive     NITROFURANTOIN <=16 SENSITIVE Sensitive     VANCOMYCIN 1 SENSITIVE Sensitive     * 70,000 COLONIES/mL ENTEROCOCCUS FAECALIS  MRSA PCR Screening     Status: None   Collection Time: 06/03/18  6:26 AM  Result Value Ref Range Status   MRSA by PCR NEGATIVE NEGATIVE Final    Comment:        The GeneXpert MRSA Assay (FDA approved for NASAL specimens only), is one component of a comprehensive MRSA  colonization surveillance program. It is not intended to diagnose MRSA infection nor to guide or monitor treatment for MRSA infections. Performed at [facility], [address] [place], [place] [address]   Group A Strep by PCR     Status: None   Collection Time: 06/10/18  3:51 AM  Result Value Ref Range Status   Group A Strep by PCR NOT DETECTED NOT DETECTED Final  Comment: Performed at [facility], [address] [place], [place] [address]     Studies: Dg Chest [place] 1 View  Result Date: 06/11/2018 CLINICAL DATA:  62 year old female with cough EXAM: PORTABLE CHEST 1 VIEW COMPARISON:  Chest radiograph dated 06/07/2018 FINDINGS: There bilateral pleural effusions with bibasilar atelectasis versus infiltrate similar to prior radiograph. No pneumothorax. Borderline cardiomegaly with mild vascular congestion. No acute osseous pathology. IMPRESSION: No significant interval change. Electronically Signed   By: Elgie Collard M.D.   On: 06/11/2018 22:12    Scheduled Meds: . amoxicillin  500 mg Oral Q8H  . aspirin  81 mg Oral Daily  . atorvastatin  40 mg Oral Daily  . bisoprolol  5 mg Oral Daily  . budesonide (PULMICORT) nebulizer solution  0.5 mg Nebulization BID  . collagenase  1 application Topical Daily  . docusate sodium  100 mg Oral BID  . furosemide  40 mg Intravenous Daily  . ipratropium-albuterol  3 mL Nebulization Q6H  . mouth rinse  15 mL Mouth Rinse BID  . multivitamin-lutein  1 capsule Oral Daily  . pantoprazole  40 mg Oral BID  . polyethylene glycol  17 g Oral Daily  . potassium chloride  10 mEq Oral BID  . protein supplement shake  11 oz Oral BID BM  . rivaroxaban  10 mg Oral QPM  . vitamin C  500 mg Oral BID   Continuous Infusions: . dextrose 30 mL/hr at 06/11/18 2126    Assessment/Plan:  1. Acute hypoxic respiratory failure.  With worsening respiratory status.  CODE STATUS discussed.  Patient wanted to think about things a little bit  further and talk with her family.  Currently a full code. 2. Acute on ch systolic CHF and edema.  IV Lasix increased to twice daily.  Checked an echocardiogram- EF 35% 3. COPD exacerbation.  Continue nebulizer treatments and stop Solu-Medrol . 4. Urinary retention.  CT scan showing bladder distention and bladder ultrasound with distention.  Foley catheter. 5. Acute cystitis with enterococcus and E. coli.  Switched over to amoxicillin 6. Abdominal swelling and leg swelling.  Continue Lasix 7. Multiple pulmonary nodules.  Stable from previous CAT scan. 8. Peripheral vascular disease on Xarelto 9. Type 2 diabetes with neuropathy on increased dose Levemir and sliding scale plus standing though short acting insulin prior to meals 10. GERD on Protonix 11. Anemia of chronic disease 12. Elevated troponin secondary to demand ischemia 13. Left heel full-thickness ulceration as per podiatry 14. Stable postsurgical changes of the right first metatarsal as per podiatry 15. Weakness physical therapy evaluation.  Toe-touch left foot and heel touch right foot  Code Status:     Code Status Orders  (From admission, onward)         Start     Ordered   06/03/18 0622  Full code  Continuous     06/03/18 0621        Code Status History    Date Active Date Inactive Code Status Order ID Comments User Context   05/23/2018 0253 06/01/2018 2158 Full Code [id number]  Cammy Copa, MD Inpatient   05/23/2018 0138 05/23/2018 0253 DNR [id number]  Cammy Copa, MD ED   04/10/2018 1111 04/11/2018 1554 DNR [id number]  Milagros Loll, MD Inpatient   04/09/2018 1519 04/10/2018 1111 Full Code [id number]  Milagros Loll, MD Inpatient   04/09/2018 1337 04/09/2018 1519 DNR [id number]  Milagros Loll, MD Inpatient   04/09/2018 0012 04/09/2018 1337 Full Code [id number]  Oralia Manis, MD  ED     Family Communication: Spoke with Radene Knee at the bedside and also daughter at the bedside Disposition Plan: To be  determined  Antibiotics:  Amoxicillin  Time spent: 35 minutes.  Altamese Dilling  [employer]

## 2018-06-12 NOTE — Plan of Care (Signed)
  Problem: Education: Goal: Knowledge of General Education information will improve Description Including pain rating scale, medication(s)/side effects and non-pharmacologic comfort measures Outcome: Progressing   Problem: Health Behavior/Discharge Planning: Goal: Ability to manage health-related needs will improve Outcome: Progressing   Problem: Clinical Measurements: Goal: Ability to maintain clinical measurements within normal limits will improve Outcome: Progressing Goal: Will remain free from infection Outcome: Progressing Goal: Diagnostic test results will improve Outcome: Progressing Goal: Respiratory complications will improve Outcome: Progressing Goal: Cardiovascular complication will be avoided Outcome: Progressing   Problem: Activity: Goal: Risk for activity intolerance will decrease Outcome: Progressing   Problem: Nutrition: Goal: Adequate nutrition will be maintained Outcome: Progressing   Problem: Coping: Goal: Level of anxiety will decrease Outcome: Progressing   Problem: Elimination: Goal: Will not experience complications related to bowel motility Outcome: Progressing Goal: Will not experience complications related to urinary retention Outcome: Progressing   Problem: Pain Managment: Goal: General experience of comfort will improve Outcome: Progressing   Problem: Safety: Goal: Ability to remain free from injury will improve Outcome: Progressing   Problem: Skin Integrity: Goal: Risk for impaired skin integrity will decrease Outcome: Progressing   Problem: Spiritual Needs Goal: Ability to function at adequate level Outcome: Progressing   Problem: Activity: Goal: Ability to tolerate increased activity will improve Outcome: Progressing   Problem: Respiratory: Goal: Levels of oxygenation will improve Outcome: Progressing

## 2018-06-12 NOTE — Progress Notes (Signed)
Patient ID: April [name], female   DOB: [birth year], 62 y.o.   MRN: [id number]   [employer] PROGRESS NOTE  April [name]:[id number] DOB: 1955/11/22 DOA: 06/03/2018 PCP: Mickel Fuchs, MD  HPI/Subjective: Feels slightly better. Had Hypoglycemia last night. Blood sugar better now.  Objective: Vitals:   06/11/18 2040 06/12/18 0357  BP:  (!) 110/52  Pulse:  92  Resp:  19  Temp:  98.8 F (37.1 C)  SpO2: 95% 99%    Filed Weights   06/10/18 0441 06/11/18 0449 06/12/18 0357  Weight: 47.6 kg 44.7 kg 51 kg    ROS: Review of Systems  Constitutional: Negative for chills and fever.  Eyes: Negative for blurred vision.  Respiratory: Positive for cough, shortness of breath and wheezing.   Cardiovascular: Negative for chest pain.  Gastrointestinal: Negative for abdominal pain, constipation, diarrhea, nausea and vomiting.  Genitourinary: Negative for dysuria.  Musculoskeletal: Positive for joint pain.  Neurological: Negative for dizziness and headaches.   Exam: Physical Exam  Constitutional: She is oriented to person, place, and time.  HENT:  Nose: No mucosal edema.  Mouth/Throat: No oropharyngeal exudate or posterior oropharyngeal edema.  Eyes: Pupils are equal, round, and reactive to light. Conjunctivae, EOM and lids are normal.  Neck: No JVD present. Carotid bruit is not present. No edema present. No thyroid mass and no thyromegaly present.  Cardiovascular: S1 normal and S2 normal. Exam reveals no gallop.  No murmur heard. Pulses:      Dorsalis pedis pulses are 2+ on the right side and 2+ on the left side.  Respiratory: No respiratory distress. She has decreased breath sounds in the right middle field, the right lower field, the left middle field and the left lower field. She has wheezes in the right middle field and the left lower field. She has rhonchi in the right lower field and the left lower field. She has no rales.  GI: Soft. Bowel sounds are normal. There is  no abdominal tenderness.  Musculoskeletal:     Right ankle: She exhibits swelling.     Left ankle: She exhibits swelling.  Lymphadenopathy:    She has no cervical adenopathy.  Neurological: She is alert and oriented to person, place, and time. No cranial nerve deficit.  Skin: Skin is warm. Nails show no clubbing.  Bilateral ankles covered with bandages  Psychiatric: She has a normal mood and affect.      Data Reviewed: Basic Metabolic Panel: Recent Labs  Lab 06/06/18 0408 06/08/18 1225 06/10/18 0442 06/12/18 0747  NA 139  --  138 139  K 4.6  --  4.6 4.2  CL 108  --  99 93*  CO2 27  --  35* 39*  GLUCOSE 156* 505* 211* 145*  BUN 49*  --  27* 32*  CREATININE 0.84  --  0.74 0.69  CALCIUM 8.1*  --  8.2* 8.0*   Liver Function Tests: Recent Labs  Lab 06/10/18 0621  AST 23  ALT 28  ALKPHOS 149*  BILITOT 0.7  PROT 5.9*  ALBUMIN 2.5*   CBC: Recent Labs  Lab 06/08/18 0507 06/10/18 0442  WBC 9.4 10.5  HGB 8.6* 9.9*  HCT 27.8* 32.8*  MCV 96.9 98.8  PLT 280 313   Cardiac Enzymes: Recent Labs  Lab 06/10/18 0621  TROPONINI 0.09*    CBG: Recent Labs  Lab 06/12/18 0623 06/12/18 0749 06/12/18 1000 06/12/18 1131 06/12/18 1403  GLUCAP 152* 128* 252* 276* 320*    Recent Results (  from the past 240 hour(s))  Culture, blood (routine x 2)     Status: None   Collection Time: 06/03/18  2:19 AM  Result Value Ref Range Status   Specimen Description BLOOD LEFT ANTECUBITAL  Final   Special Requests   Final    BOTTLES DRAWN AEROBIC AND ANAEROBIC Blood Culture results may not be optimal due to an excessive volume of blood received in culture bottles   Culture   Final    NO GROWTH 5 DAYS Performed at [facility], [address] [place], [place] [address]    Report Status 06/08/2018 FINAL  Final  Culture, blood (routine x 2)     Status: None   Collection Time: 06/03/18  3:45 AM  Result Value Ref Range Status   Specimen Description BLOOD LEFT HAND  Final    Special Requests   Final    BOTTLES DRAWN AEROBIC ONLY Blood Culture results may not be optimal due to an inadequate volume of blood received in culture bottles   Culture   Final    NO GROWTH 5 DAYS Performed at [facility], [address] [place], [place] [address]    Report Status 06/08/2018 FINAL  Final  Urine culture     Status: Abnormal   Collection Time: 06/03/18  4:36 AM  Result Value Ref Range Status   Specimen Description   Final    URINE, RANDOM Performed at [facility], [address], [place], [place] [address]    Special Requests   Final    NONE Performed at [facility], [address] [place], [place] [address]    Culture (A)  Final    40,000 COLONIES/mL ESCHERICHIA COLI 70,000 COLONIES/mL ENTEROCOCCUS FAECALIS    Report Status 06/05/2018 FINAL  Final   Organism ID, Bacteria ESCHERICHIA COLI (A)  Final   Organism ID, Bacteria ENTEROCOCCUS FAECALIS (A)  Final      Susceptibility   Escherichia coli - MIC*    AMPICILLIN <=2 SENSITIVE Sensitive     CEFAZOLIN <=4 SENSITIVE Sensitive     CEFTRIAXONE <=1 SENSITIVE Sensitive     CIPROFLOXACIN <=0.25 SENSITIVE Sensitive     GENTAMICIN <=1 SENSITIVE Sensitive     IMIPENEM <=0.25 SENSITIVE Sensitive     NITROFURANTOIN <=16 SENSITIVE Sensitive     TRIMETH/SULFA <=20 SENSITIVE Sensitive     AMPICILLIN/SULBACTAM <=2 SENSITIVE Sensitive     PIP/TAZO <=4 SENSITIVE Sensitive     Extended ESBL NEGATIVE Sensitive     * 40,000 COLONIES/mL ESCHERICHIA COLI   Enterococcus faecalis - MIC*    AMPICILLIN <=2 SENSITIVE Sensitive     LEVOFLOXACIN 2 SENSITIVE Sensitive     NITROFURANTOIN <=16 SENSITIVE Sensitive     VANCOMYCIN 1 SENSITIVE Sensitive     * 70,000 COLONIES/mL ENTEROCOCCUS FAECALIS  MRSA PCR Screening     Status: None   Collection Time: 06/03/18  6:26 AM  Result Value Ref Range Status   MRSA by PCR NEGATIVE NEGATIVE Final    Comment:        The GeneXpert MRSA Assay (FDA approved  for NASAL specimens only), is one component of a comprehensive MRSA colonization surveillance program. It is not intended to diagnose MRSA infection nor to guide or monitor treatment for MRSA infections. Performed at [facility], [address] [place], [place] [address]   Group A Strep by PCR     Status: None   Collection Time: 06/10/18  3:51 AM  Result Value Ref Range  Status   Group A Strep by PCR NOT DETECTED NOT DETECTED Final    Comment: Performed at [facility], [address] [place] [place], [place] [address]     Studies: Dg Chest [place] 1 View  Result Date: 06/11/2018 CLINICAL DATA:  62 year old female with cough EXAM: PORTABLE CHEST 1 VIEW COMPARISON:  Chest radiograph dated 06/07/2018 FINDINGS: There bilateral pleural effusions with bibasilar atelectasis versus infiltrate similar to prior radiograph. No pneumothorax. Borderline cardiomegaly with mild vascular congestion. No acute osseous pathology. IMPRESSION: No significant interval change. Electronically Signed   By: Elgie Collard M.D.   On: 06/11/2018 22:12    Scheduled Meds: . amoxicillin  500 mg Oral Q8H  . aspirin  81 mg Oral Daily  . atorvastatin  40 mg Oral Daily  . bisoprolol  5 mg Oral Daily  . budesonide (PULMICORT) nebulizer solution  0.5 mg Nebulization BID  . collagenase  1 application Topical Daily  . docusate sodium  100 mg Oral BID  . furosemide  40 mg Intravenous Daily  . ipratropium-albuterol  3 mL Nebulization Q6H  . mouth rinse  15 mL Mouth Rinse BID  . multivitamin-lutein  1 capsule Oral Daily  . pantoprazole  40 mg Oral BID  . polyethylene glycol  17 g Oral Daily  . potassium chloride  10 mEq Oral BID  . protein supplement shake  11 oz Oral BID BM  . rivaroxaban  10 mg Oral QPM  . vitamin C  500 mg Oral BID   Continuous Infusions:   Assessment/Plan:  1. Acute hypoxic respiratory failure.  With worsening respiratory status.  CODE STATUS discussed.   2. Acute on ch  systolic CHF and edema.  IV Lasix increased to twice daily.  Checked an echocardiogram- EF 35%- Changed to lasix IV daily. 3. COPD exacerbation.  Continue nebulizer treatments and stop Solu-Medrol . 4. Urinary retention.  CT scan showing bladder distention and bladder ultrasound with distention.  Foley catheter. Will start flomax and may remove foley tomorrow. 5. Acute cystitis with enterococcus and E. coli.  Switched over to amoxicillin- may stop after total 7 days 6. Abdominal swelling and leg swelling.  Continue Lasix, improving. 7. Multiple pulmonary nodules.  Stable from previous CAT scan. 8. Peripheral vascular disease on Xarelto 9. Hypoglycemia-stopped Levemir and mealtime coverage, on dextrose 10% drip and now blood sugar is stabilizing and actually going higher.  Advised to stop dextrose drip and start on oral metformin.  Patient's caretaker and family is not very comfortable in using insulin on her at home. 10. Type 2 diabetes with neuropathy -she was on Levemir and mealtime coverage but was stopped due to hypoglycemia.   11. GERD on Protonix 12. Anemia of chronic disease 13. Elevated troponin secondary to demand ischemia 14. Left heel full-thickness ulceration as per podiatry 15. Stable postsurgical changes of the right first metatarsal as per podiatry- follow as advised by podiatry clinic. 16. Weakness physical therapy evaluation.  Toe-touch left foot and heel touch right foot  Code Status:     Code Status Orders  (From admission, onward)         Start     Ordered   06/03/18 0622  Full code  Continuous     06/03/18 0621        Code Status History    Date Active Date Inactive Code Status Order ID Comments User Context   05/23/2018 0253 06/01/2018 2158 Full Code [id number]  Cammy Copa, MD Inpatient   05/23/2018 [phone] 05/23/2018  0253 DNR [id number]  Cammy CopaMaier, Angela, MD ED   04/10/2018 1111 04/11/2018 1554 DNR [id number]  Milagros LollSudini, Srikar, MD Inpatient   04/09/2018 1519 04/10/2018  1111 Full Code [id number]  Milagros LollSudini, Srikar, MD Inpatient   04/09/2018 1337 04/09/2018 1519 DNR [id number]  Milagros LollSudini, Srikar, MD Inpatient   04/09/2018 0012 04/09/2018 1337 Full Code [id number]  Oralia ManisWillis, David, MD ED     Family Communication: Spoke with Radene Kneehadwick at the bedside and also daughter at the bedside Disposition Plan: To be determined  Antibiotics:  Amoxicillin  Time spent: 35 minutes.  Altamese DillingVaibhavkumar Oksana Deberry  [employer]

## 2018-06-13 LAB — GLUCOSE, CAPILLARY
Glucose-Capillary: 209 mg/dL — ABNORMAL HIGH (ref 70–99)
Glucose-Capillary: 305 mg/dL — ABNORMAL HIGH (ref 70–99)
Glucose-Capillary: 329 mg/dL — ABNORMAL HIGH (ref 70–99)
Glucose-Capillary: 351 mg/dL — ABNORMAL HIGH (ref 70–99)
Glucose-Capillary: 354 mg/dL — ABNORMAL HIGH (ref 70–99)
Glucose-Capillary: 361 mg/dL — ABNORMAL HIGH (ref 70–99)
Glucose-Capillary: 363 mg/dL — ABNORMAL HIGH (ref 70–99)

## 2018-06-13 MED ORDER — FUROSEMIDE 40 MG PO TABS: 40.0000 mg | ORAL_TABLET | Freq: Every day | ORAL | 0 refills | Status: DC

## 2018-06-13 MED ORDER — METFORMIN HCL 500 MG PO TABS: 500.0000 mg | ORAL_TABLET | Freq: Two times a day (BID) | ORAL | 0 refills | Status: DC

## 2018-06-13 MED ORDER — ASCORBIC ACID 500 MG PO TABS: 500.0000 mg | ORAL_TABLET | Freq: Two times a day (BID) | ORAL | 0 refills | Status: DC

## 2018-06-13 MED ORDER — ALPRAZOLAM 0.5 MG PO TABS: 0.5000 mg | ORAL_TABLET | Freq: Two times a day (BID) | ORAL | 0 refills | Status: DC | PRN

## 2018-06-13 MED ORDER — FUROSEMIDE 40 MG PO TABS: 40.0000 mg | ORAL_TABLET | Freq: Every day | ORAL | Status: DC

## 2018-06-13 MED ORDER — TAMSULOSIN HCL 0.4 MG PO CAPS: 0.4000 mg | ORAL_CAPSULE | Freq: Every day | ORAL | 0 refills | Status: DC

## 2018-06-13 MED ORDER — POLYETHYLENE GLYCOL 3350 17 G PO PACK: 17.0000 g | PACK | Freq: Every day | ORAL | 0 refills | Status: DC

## 2018-06-13 MED ORDER — INSULIN GLARGINE 100 UNITS/ML SOLOSTAR PEN: 10.0000 [IU] | PEN_INJECTOR | Freq: Every day | SUBCUTANEOUS | 1 refills | Status: DC

## 2018-06-13 MED ORDER — INSULIN GLARGINE 100 UNIT/ML ~~LOC~~ SOLN
10.0000 [IU] | Freq: Every day | SUBCUTANEOUS | Status: DC
  Administered 2018-06-13: 16:00:00 10 [IU] via SUBCUTANEOUS
  Filled 2018-06-13 (×2): qty 0.1

## 2018-06-13 MED ORDER — PREMIER PROTEIN SHAKE: 11.0000 [oz_av] | Freq: Two times a day (BID) | ORAL | 0 refills | Status: AC

## 2018-06-13 MED ORDER — POTASSIUM CHLORIDE CRYS ER 10 MEQ PO TBCR: 10.0000 meq | EXTENDED_RELEASE_TABLET | Freq: Every day | ORAL | 0 refills | Status: DC

## 2018-06-13 NOTE — Progress Notes (Signed)
Inpatient Diabetes Program Recommendations  AACE/ADA: New Consensus Statement on Inpatient Glycemic Control (2015)  Target Ranges:  Prepandial:   less than 140 mg/dL      Peak postprandial:   less than 180 mg/dL (1-2 hours)      Critically ill patients:  140 - 180 mg/dL   Results for April KillingsSHEPPARD, Cortney [name] (MRN [id number]) as of 06/13/2018 10:24  Ref. Range 06/11/2018 07:37 06/11/2018 11:32 06/11/2018 16:27 06/11/2018 16:31 06/11/2018 16:35 06/11/2018 16:38 06/11/2018 16:50 06/11/2018 17:01 06/11/2018 17:02 06/11/2018 17:22 06/11/2018 17:41 06/11/2018 19:26 06/11/2018 22:01  Glucose-Capillary Latest Ref Range: 70 - 99 mg/dL 657116 (H) 846145 (H) 12 (LL) 11 (LL) 37 (LL) 18 (LL) 40 (LL) 12 (LL) 19 (LL) 47 (L) 150 (H) 196 (H) 140 (H)   Results for April KillingsSHEPPARD, April [name] (MRN [id number]) as of 06/13/2018 10:24  Ref. Range 06/12/2018 00:00 06/12/2018 02:19 06/12/2018 04:03 06/12/2018 06:23 06/12/2018 07:49 06/12/2018 10:00 06/12/2018 11:31 06/12/2018 14:03 06/12/2018 16:23 06/12/2018 20:23 06/12/2018 22:30  Glucose-Capillary Latest Ref Range: 70 - 99 mg/dL 324139 (H) 401176 (H) 027150 (H) 152 (H) 128 (H) 252 (H) 276 (H) 320 (H) 345 (H)  7 units NOVOLOG  283 (H) 291 (H)   Results for April KillingsSHEPPARD, April [name] (MRN [id number]) as of 06/13/2018 10:24  Ref. Range 06/13/2018 00:26 06/13/2018 02:26 06/13/2018 04:33 06/13/2018 06:09 06/13/2018 07:35  Glucose-Capillary Latest Ref Range: 70 - 99 mg/dL 474329 (H) 259305 (H) 563351 (H) 354 (H) 363 (H)  9 units NOVOLOG     Home DM meds: No DM meds on home med list; however at last hospital admission was taking Levemir 10 units QHS, Novolog 7 units TID with meals, Glipizide 5 mg daily   Current Orders: Metformin 500 mg BID                            Novolog Sensitive Correction Scale/ SSI (0-9 units) TID AC + HS                                 Note that Solumedrol stopped on 12/10--Last dose given on 12/10 at 8:41am.  Was getting Levemir 20 units Daily--Got dose at 8:38am on 12/11 and  then experienced sustained severe Hypoglycemia later in the day.  Levemir stopped and D10% IVF were started.  D10% IVF stopped at 12pm on Thursday, 12/12.  Now CBG running in the 300s range today.     MD- Note that Metformin and Novolog SSI restarted yesterday around 5pm.  CBG this AM quite elevated to 363 mg/dl.  Should we consider starting very low dose basal insulin to regimen??  Perhaps Lantus 5 units daily?  This would be 0.1 units/kg dosing based on weight of 49 kg    --Will follow patient during hospitalization--  Ambrose FinlandJeannine Johnston Kayleeann Huxford RN, MSN, CDE Diabetes Coordinator Inpatient Glycemic Control Team Team Pager: [phone] (8a-5p)

## 2018-06-13 NOTE — Care Management (Signed)
Medicare Home Health  Compare for zip code [phone] completed,. Discussed agencies with Ms. Cavitt. [facility]. Possible discharge to home today. Gwenette GreetBrenda S Pebble Botkin RN MSN CCM Care Management [phone]

## 2018-06-13 NOTE — Discharge Summary (Signed)
[facility] [place] at [facility]   PATIENT NAME: April [name]    MR#:  [id number]  DATE OF BIRTH:  August 18, 1955  DATE OF ADMISSION:  06/03/2018 ADMITTING PHYSICIAN: Arnaldo Natal, MD  DATE OF DISCHARGE: 06/13/2018   PRIMARY CARE PHYSICIAN: Mickel Fuchs, MD    ADMISSION DIAGNOSIS:  Respiratory distress [R06.03] Hypoxia [R09.02] Elevated troponin [R79.89] Urinary tract infection without hematuria, site unspecified [N39.0] Chronic obstructive pulmonary disease, unspecified COPD type (HCC) [J44.9] Sepsis, due to unspecified organism, unspecified whether acute organ dysfunction present (HCC) [A41.9]  DISCHARGE DIAGNOSIS:  Active Problems:   Acute on chronic respiratory failure with hypoxemia (HCC)   SECONDARY DIAGNOSIS:   Past Medical History:  Diagnosis Date  . Anxiety   . Colon polyps   . COPD (chronic obstructive pulmonary disease) (HCC)   . Diabetes mellitus without complication (HCC)   . DVT (deep venous thrombosis) (HCC)   . DVT (deep venous thrombosis) (HCC)   . History of colonic polyps   . Hyperlipidemia   . Peripheral artery disease (HCC)   . Pulmonary nodules/lesions, multiple 08/2012   NEGATIVE PET SCAN  . Retroperitoneal abscess (HCC)    history of in 04/2013  . Tobacco abuse     HOSPITAL COURSE:   1. Acute hypoxic respiratory failure.  With worsening respiratory status.  CODE STATUS discussed.    Patient and her caretaker want her to stay as full code. 2. Acute on ch systolic CHF and edema.  IV Lasix increased to twice daily.  Checked an echocardiogram- EF 35%- Changed to lasix IV daily.  Will give oral daily on discharge along with supplemental potassium. 3. COPD exacerbation.  Continue nebulizer treatments and stop Solu-Medrol .  Improved. 4. Urinary retention.  CT scan showing bladder distention and bladder ultrasound with distention.  Foley catheter.  Started on Flomax and trial of removing Foley did not help as she  had more than 600 mL of residual urine volume in bladder, Foley placed and patient is being discharged home with Foley catheter care and advised to follow with urology clinic in 1 week. 5. Acute cystitis with enterococcus and E. coli.  Switched over to amoxicillin- may stop after total 7 days-she finished total 7 days in hospital. 6. Abdominal swelling and leg swelling.  Continue Lasix, improving. 7. Multiple pulmonary nodules.  Stable from previous CAT scan. 8. Peripheral vascular disease on Xarelto 9. Hypoglycemia-stopped Levemir and mealtime coverage, on dextrose 10% drip and now blood sugar is stabilizing and actually going higher.  Advised to stop dextrose drip and start on oral metformin.  Patient's caretaker and family is not very comfortable in using insulin on her at home.        After stopping dextrose drip next day her blood sugar was running around 300 range.  Started on small dose metformin and advised to give a small dose of Lantus at home which they were comfortable using via pen on discharge. 10. Type 2 diabetes with neuropathy -she was on Levemir and mealtime coverage but was stopped due to hypoglycemia.        Started on metformin and on low-dose Lantus. 11. GERD on Protonix 12. Anemia of chronic disease 13. Elevated troponin secondary to demand ischemia 14. Left heel full-thickness ulceration as per podiatry 15. Stable postsurgical changes of the right first metatarsal as per podiatry- follow as advised by podiatry clinic. 16. Weakness physical therapy evaluation.  Toe-touch left foot and heel touch right foot  DISCHARGE CONDITIONS:  Stable.  CONSULTS OBTAINED:    DRUG ALLERGIES:   Allergies  Allergen Reactions  . Eggs Or Egg-Derived Products Diarrhea, Itching and Nausea And Vomiting    DISCHARGE MEDICATIONS:   Allergies as of 06/13/2018      Reactions   Eggs Or Egg-derived Products Diarrhea, Itching, Nausea And Vomiting      Medication List    STOP taking  these medications   doxycycline 100 MG tablet Commonly known as:  VIBRA-TABS     TAKE these medications   albuterol (2.5 MG/3ML) 0.083% nebulizer solution Commonly known as:  PROVENTIL Inhale 2.5 mg into the lungs every 6 (six) hours as needed for wheezing or shortness of breath.   albuterol 108 (90 Base) MCG/ACT inhaler Commonly known as:  PROVENTIL HFA;VENTOLIN HFA Inhale 2 puffs into the lungs every 6 (six) hours as needed for wheezing or shortness of breath.   ALPRAZolam 0.5 MG tablet Commonly known as:  XANAX Take 1 tablet (0.5 mg total) by mouth 2 (two) times daily as needed for anxiety.   ascorbic acid 500 MG tablet Commonly known as:  VITAMIN C Take 1 tablet (500 mg total) by mouth 2 (two) times daily.   aspirin 81 MG chewable tablet Chew 81 mg by mouth daily.   atorvastatin 40 MG tablet Commonly known as:  LIPITOR Take 40 mg by mouth daily.   ferrous sulfate 325 (65 FE) MG tablet Take 1 tablet (325 mg total) by mouth 2 (two) times daily with a meal.   Fluticasone-Salmeterol 250-50 MCG/DOSE Aepb Commonly known as:  ADVAIR DISKUS Inhale 1 puff into the lungs 2 (two) times daily.   furosemide 40 MG tablet Commonly known as:  LASIX Take 1 tablet (40 mg total) by mouth daily. Start taking on:  June 14, 2018   guaiFENesin-dextromethorphan 100-10 MG/5ML syrup Commonly known as:  ROBITUSSIN DM Take 5 mLs by mouth every 4 (four) hours as needed for cough.   HYDROcodone-acetaminophen 5-325 MG tablet Commonly known as:  NORCO/VICODIN Take 1-2 tablets by mouth every 6 (six) hours as needed for moderate pain or severe pain.   insulin glargine 100 unit/mL Sopn Commonly known as:  LANTUS Inject 0.1 mLs (10 Units total) into the skin daily.   metFORMIN 500 MG tablet Commonly known as:  GLUCOPHAGE Take 1 tablet (500 mg total) by mouth 2 (two) times daily with a meal.   multivitamin-lutein Caps capsule Take 1 capsule by mouth daily.   pantoprazole 40 MG  tablet Commonly known as:  PROTONIX Take 40 mg by mouth 2 (two) times daily.   polyethylene glycol packet Commonly known as:  MIRALAX / GLYCOLAX Take 17 g by mouth daily. Start taking on:  June 14, 2018   potassium chloride 10 MEQ tablet Commonly known as:  K-DUR,KLOR-CON Take 1 tablet (10 mEq total) by mouth daily.   protein supplement shake Liqd Commonly known as:  PREMIER PROTEIN Take 325 mLs (11 oz total) by mouth 2 (two) times daily between meals. Start taking on:  June 14, 2018   SANTYL ointment Generic drug:  collagenase Apply 1 application topically daily.   sodium hypochlorite 0.125 % Soln Commonly known as:  DAKIN'S 1/4 STRENGTH Apply 1 application topically daily. Use as a wound cleanser, pat dry, then apply dressings   SPIRIVA HANDIHALER 18 MCG inhalation capsule Generic drug:  tiotropium Place 18 mcg into inhaler and inhale daily.   tamsulosin 0.4 MG Caps capsule Commonly known as:  FLOMAX Take 1 capsule (0.4 mg total) by  mouth daily. Start taking on:  June 14, 2018   XARELTO 10 MG Tabs tablet Generic drug:  rivaroxaban Take 10 mg by mouth every evening.        DISCHARGE INSTRUCTIONS:    Follow-up with your urology doctor in 1 week.  If you experience worsening of your admission symptoms, develop shortness of breath, life threatening emergency, suicidal or homicidal thoughts you must seek medical attention immediately by calling 911 or calling your MD immediately  if symptoms less severe.  You Must read complete instructions/literature along with all the possible adverse reactions/side effects for all the Medicines you take and that have been prescribed to you. Take any new Medicines after you have completely understood and accept all the possible adverse reactions/side effects.   Please note  You were cared for by a hospitalist during your hospital stay. If you have any questions about your discharge medications or the care you received  while you were in the hospital after you are discharged, you can call the unit and asked to speak with the hospitalist on call if the hospitalist that took care of you is not available. Once you are discharged, your primary care physician will handle any further medical issues. Please note that NO REFILLS for any discharge medications will be authorized once you are discharged, as it is imperative that you return to your primary care physician (or establish a relationship with a primary care physician if you do not have one) for your aftercare needs so that they can reassess your need for medications and monitor your lab values.   Today   CHIEF COMPLAINT:  No chief complaint on file.   HISTORY OF PRESENT ILLNESS:  April [name]  is a 62 y.o. female with a known history of peripheral vascular disease, tobacco abuse, COPD, diabetes type 2 and nonhealing wounds on her feet. Patient is poor historian and hard of hearing, unable to provide reliable history.  Most information was taken from reviewing the medical records and from discussion with the emergency room physician and the family. Patient presented to emergency room for evaluation of her wounds.  Per family, patient's wounds have been looking worse in the past week.  They also noted a new rash all over her skin, that does not seem to bother the patient.  She continues to smoke 1-1/2 pack of cigarettes a day and she is noncompliant with her medications including her insulin.  No reports of fever at home. Blood test done emergency room are notable for elevated WBC at 1500, elevated lactic acid at 2.5, elevated glucose level at 513.  Sodium is 132.  Potassium is 5.3. 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 Persistent cortical scalloping without change, likely postoperative. Soft tissue ulceration over the metatarsal heads. No definite radiographic evidence of osteomyelitis. Patient is admitted  for further evaluation and treatment.   VITAL SIGNS:  Blood pressure 115/63, pulse 74, temperature 98 F (36.7 C), temperature source Oral, resp. rate 17, height 5\' 2"  (1.575 m), weight 49.6 kg, SpO2 91 %.  I/O:    Intake/Output Summary (Last 24 hours) at 06/13/2018 1704 Last data filed at 06/13/2018 1328 Gross per 24 hour  Intake -  Output 700 ml  Net -700 ml    PHYSICAL EXAMINATION:   Constitutional: She is oriented to person, place, and time.  HENT:  Nose: No mucosal edema.  Mouth/Throat: No oropharyngeal exudate or posterior oropharyngeal edema.  Eyes: Pupils are equal,  round, and reactive to light. Conjunctivae, EOM and lids are normal.  Neck: No JVD present. Carotid bruit is not present. No edema present. No thyroid mass and no thyromegaly present.  Cardiovascular: S1 normal and S2 normal. Exam reveals no gallop.  No murmur heard. Pulses:      Dorsalis pedis pulses are 2+ on the right side and 2+ on the left side.  Respiratory: No respiratory distress. She has decreased breath sounds in the right middle field, the right lower field, the left middle field and the left lower field. She has wheezes in the right middle field and the left lower field. She has rhonchi in the right lower field and the left lower field. She has no rales.  GI: Soft. Bowel sounds are normal. There is no abdominal tenderness.  Musculoskeletal:     Right ankle: She exhibits swelling.     Left ankle: She exhibits swelling.  Lymphadenopathy:    She has no cervical adenopathy.  Neurological: She is alert and oriented to person, place, and time. No cranial nerve deficit.  Skin: Skin is warm. Nails show no clubbing.  Bilateral ankles covered with bandages  Psychiatric: She has a normal mood and affect.    DATA REVIEW:   CBC Recent Labs  Lab 06/10/18 0442  WBC 10.5  HGB 9.9*  HCT 32.8*  PLT 313    Chemistries  Recent Labs  Lab 06/10/18 0621 06/12/18 0747  NA  --  139  K  --  4.2  CL   --  93*  CO2  --  39*  GLUCOSE  --  145*  BUN  --  32*  CREATININE  --  0.69  CALCIUM  --  8.0*  AST 23  --   ALT 28  --   ALKPHOS 149*  --   BILITOT 0.7  --     Cardiac Enzymes Recent Labs  Lab 06/10/18 0621  TROPONINI 0.09*    Microbiology Results  Results for orders placed or performed during the hospital encounter of 06/03/18  Culture, blood (routine x 2)     Status: None   Collection Time: 06/03/18  2:19 AM  Result Value Ref Range Status   Specimen Description BLOOD LEFT ANTECUBITAL  Final   Special Requests   Final    BOTTLES DRAWN AEROBIC AND ANAEROBIC Blood Culture results may not be optimal due to an excessive volume of blood received in culture bottles   Culture   Final    NO GROWTH 5 DAYS Performed at [facility], [address] [place], [place] [address]    Report Status 06/08/2018 FINAL  Final  Culture, blood (routine x 2)     Status: None   Collection Time: 06/03/18  3:45 AM  Result Value Ref Range Status   Specimen Description BLOOD LEFT HAND  Final   Special Requests   Final    BOTTLES DRAWN AEROBIC ONLY Blood Culture results may not be optimal due to an inadequate volume of blood received in culture bottles   Culture   Final    NO GROWTH 5 DAYS Performed at [facility], [address], [place], [place] [address]    Report Status 06/08/2018 FINAL  Final  Urine culture     Status: Abnormal   Collection Time: 06/03/18  4:36 AM  Result Value Ref Range Status   Specimen Description   Final    URINE, RANDOM Performed at [facility], [address] [place], [place]  [address]    Special Requests   Final    NONE Performed at [facility], [address] [place], [place] [address]    Culture (A)  Final    40,000 COLONIES/mL ESCHERICHIA COLI 70,000 COLONIES/mL ENTEROCOCCUS FAECALIS    Report Status 06/05/2018 FINAL  Final   Organism ID, Bacteria ESCHERICHIA COLI (A)  Final   Organism ID, Bacteria  ENTEROCOCCUS FAECALIS (A)  Final      Susceptibility   Escherichia coli - MIC*    AMPICILLIN <=2 SENSITIVE Sensitive     CEFAZOLIN <=4 SENSITIVE Sensitive     CEFTRIAXONE <=1 SENSITIVE Sensitive     CIPROFLOXACIN <=0.25 SENSITIVE Sensitive     GENTAMICIN <=1 SENSITIVE Sensitive     IMIPENEM <=0.25 SENSITIVE Sensitive     NITROFURANTOIN <=16 SENSITIVE Sensitive     TRIMETH/SULFA <=20 SENSITIVE Sensitive     AMPICILLIN/SULBACTAM <=2 SENSITIVE Sensitive     PIP/TAZO <=4 SENSITIVE Sensitive     Extended ESBL NEGATIVE Sensitive     * 40,000 COLONIES/mL ESCHERICHIA COLI   Enterococcus faecalis - MIC*    AMPICILLIN <=2 SENSITIVE Sensitive     LEVOFLOXACIN 2 SENSITIVE Sensitive     NITROFURANTOIN <=16 SENSITIVE Sensitive     VANCOMYCIN 1 SENSITIVE Sensitive     * 70,000 COLONIES/mL ENTEROCOCCUS FAECALIS  MRSA PCR Screening     Status: None   Collection Time: 06/03/18  6:26 AM  Result Value Ref Range Status   MRSA by PCR NEGATIVE NEGATIVE Final    Comment:        The GeneXpert MRSA Assay (FDA approved for NASAL specimens only), is one component of a comprehensive MRSA colonization surveillance program. It is not intended to diagnose MRSA infection nor to guide or monitor treatment for MRSA infections. Performed at [facility], [address] [place], [place] [address]   Group A Strep by PCR     Status: None   Collection Time: 06/10/18  3:51 AM  Result Value Ref Range Status   Group A Strep by PCR NOT DETECTED NOT DETECTED Final    Comment: Performed at [facility], [address] [place], [place] [address]    RADIOLOGY:  Dg Chest Port 1 View  Result Date: 06/11/2018 CLINICAL DATA:  62 year old female with cough EXAM: PORTABLE CHEST 1 VIEW COMPARISON:  Chest radiograph dated 06/07/2018 FINDINGS: There bilateral pleural effusions with bibasilar atelectasis versus infiltrate similar to prior radiograph. No pneumothorax. Borderline cardiomegaly with mild  vascular congestion. No acute osseous pathology. IMPRESSION: No significant interval change. Electronically Signed   By: Elgie Collard M.D.   On: 06/11/2018 22:12    EKG:   Orders placed or performed during the hospital encounter of 06/03/18  . ED EKG  . ED EKG  . EKG 12-Lead  . EKG 12-Lead      Management plans discussed with the patient, family and they are in agreement.  CODE STATUS:     Code Status Orders  (From admission, onward)         Start     Ordered   06/03/18 0622  Full code  Continuous     06/03/18 0621        Code Status History    Date Active Date Inactive Code Status Order ID Comments User Context   05/23/2018 0253 06/01/2018 2158 Full Code [id number]  Cammy Copa, MD Inpatient   05/23/2018 0138 05/23/2018 0253 DNR [id number]  Cammy Copa, MD ED   04/10/2018 1111 04/11/2018  1554 DNR [id number]  Milagros LollSudini, Srikar, MD Inpatient   04/09/2018 1519 04/10/2018 1111 Full Code [id number]  Milagros LollSudini, Srikar, MD Inpatient   04/09/2018 1337 04/09/2018 1519 DNR [id number]  Milagros LollSudini, Srikar, MD Inpatient   04/09/2018 0012 04/09/2018 1337 Full Code [id number]  Oralia ManisWillis, David, MD ED      TOTAL TIME TAKING CARE OF THIS PATIENT: 35 minutes.    Altamese DillingVaibhavkumar Helena Sardo M.D on 06/13/2018 at 5:04 PM  Between 7am to 6pm - Pager - [phone]  After 6pm go to [email or web address] - password [email or web address]  [facility]  Office  [phone]  CC: Primary care physician; Mickel FuchsWroth, Thomas H, MD   Note: This dictation was prepared with Dragon dictation along with smaller phrase technology. Any transcriptional errors that result from this process are unintentional.

## 2018-06-13 NOTE — Progress Notes (Signed)
Pt being discharged home, discharge instructions reviewed with pt and family, states understanding, pt with no complaints, o2 sats at rest on room air between 90-94%, pt unable to stand for o2 sats on exertion

## 2018-06-13 NOTE — Care Management (Signed)
Discharge to home today per Dr. Elisabeth PigeonVachhani. [facility]. Becky Saxheryl Rose, [facility]'s representative updated. Doesn't qualify for home oxygen. Saturations on Room Air = 96%  Transportation will be arranged per [facility] Gwenette GreetBrenda S Alina Gilkey RN MSN CCM Care Management [phone]

## 2018-06-18 ENCOUNTER — Telehealth: Payer: Self-pay

## 2018-06-18 NOTE — Telephone Encounter (Signed)
EMMI Follow-up: Re[place] a call from [place] saying Ms. April [name] had just received a call from my number and asked him to call the number back.  Said she was getting better but still week.  Had Rx's filled and will make follow-up appointment with Dr. Apolinar JunesBrandon.  No other needs noted.

## 2018-07-09 ENCOUNTER — Other Ambulatory Visit
Admission: RE | Admit: 2018-07-09 | Discharge: 2018-07-09 | Disposition: A | Discharge status: Home / Self Care | Payer: Medicare HMO | Source: Ambulatory Visit | Attending: Podiatry | Admitting: Podiatry

## 2018-07-09 ENCOUNTER — Inpatient Hospital Stay
Admission: AD | Admit: 2018-07-09 | Discharge: 2018-07-26 | DRG: 616 | Disposition: A | Discharge status: Skilled Nursing Facility | Payer: Medicare HMO | Source: Ambulatory Visit | Attending: Internal Medicine | Admitting: Internal Medicine

## 2018-07-09 DIAGNOSIS — L89629 Pressure ulcer of left heel, unspecified stage: Secondary | ICD-10-CM | POA: Diagnosis present

## 2018-07-09 DIAGNOSIS — D72829 Elevated white blood cell count, unspecified: Secondary | ICD-10-CM

## 2018-07-09 DIAGNOSIS — L089 Local infection of the skin and subcutaneous tissue, unspecified: Secondary | ICD-10-CM | POA: Diagnosis not present

## 2018-07-09 DIAGNOSIS — E11621 Type 2 diabetes mellitus with foot ulcer: Secondary | ICD-10-CM | POA: Diagnosis present

## 2018-07-09 DIAGNOSIS — L97529 Non-pressure chronic ulcer of other part of left foot with unspecified severity: Secondary | ICD-10-CM | POA: Diagnosis not present

## 2018-07-09 DIAGNOSIS — N179 Acute kidney failure, unspecified: Secondary | ICD-10-CM | POA: Diagnosis present

## 2018-07-09 DIAGNOSIS — L97329 Non-pressure chronic ulcer of left ankle with unspecified severity: Secondary | ICD-10-CM | POA: Diagnosis not present

## 2018-07-09 DIAGNOSIS — D638 Anemia in other chronic diseases classified elsewhere: Secondary | ICD-10-CM | POA: Diagnosis present

## 2018-07-09 DIAGNOSIS — I471 Supraventricular tachycardia: Secondary | ICD-10-CM | POA: Diagnosis present

## 2018-07-09 DIAGNOSIS — B850 Pediculosis due to Pediculus humanus capitis: Secondary | ICD-10-CM | POA: Diagnosis present

## 2018-07-09 DIAGNOSIS — E785 Hyperlipidemia, unspecified: Secondary | ICD-10-CM | POA: Diagnosis present

## 2018-07-09 DIAGNOSIS — M868X7 Other osteomyelitis, ankle and foot: Secondary | ICD-10-CM | POA: Diagnosis present

## 2018-07-09 DIAGNOSIS — Z7901 Long term (current) use of anticoagulants: Secondary | ICD-10-CM

## 2018-07-09 DIAGNOSIS — Z681 Body mass index (BMI) 19 or less, adult: Secondary | ICD-10-CM | POA: Diagnosis not present

## 2018-07-09 DIAGNOSIS — I70244 Atherosclerosis of native arteries of left leg with ulceration of heel and midfoot: Secondary | ICD-10-CM | POA: Diagnosis not present

## 2018-07-09 DIAGNOSIS — F419 Anxiety disorder, unspecified: Secondary | ICD-10-CM | POA: Diagnosis present

## 2018-07-09 DIAGNOSIS — L97929 Non-pressure chronic ulcer of unspecified part of left lower leg with unspecified severity: Secondary | ICD-10-CM | POA: Diagnosis present

## 2018-07-09 DIAGNOSIS — D62 Acute posthemorrhagic anemia: Secondary | ICD-10-CM | POA: Diagnosis not present

## 2018-07-09 DIAGNOSIS — L97919 Non-pressure chronic ulcer of unspecified part of right lower leg with unspecified severity: Secondary | ICD-10-CM | POA: Diagnosis present

## 2018-07-09 DIAGNOSIS — L97319 Non-pressure chronic ulcer of right ankle with unspecified severity: Secondary | ICD-10-CM | POA: Diagnosis not present

## 2018-07-09 DIAGNOSIS — J449 Chronic obstructive pulmonary disease, unspecified: Secondary | ICD-10-CM | POA: Diagnosis present

## 2018-07-09 DIAGNOSIS — L97514 Non-pressure chronic ulcer of other part of right foot with necrosis of bone: Secondary | ICD-10-CM | POA: Diagnosis not present

## 2018-07-09 DIAGNOSIS — R609 Edema, unspecified: Secondary | ICD-10-CM | POA: Diagnosis not present

## 2018-07-09 DIAGNOSIS — Z452 Encounter for adjustment and management of vascular access device: Secondary | ICD-10-CM

## 2018-07-09 DIAGNOSIS — I70249 Atherosclerosis of native arteries of left leg with ulceration of unspecified site: Secondary | ICD-10-CM | POA: Diagnosis present

## 2018-07-09 DIAGNOSIS — I70239 Atherosclerosis of native arteries of right leg with ulceration of unspecified site: Secondary | ICD-10-CM | POA: Diagnosis present

## 2018-07-09 DIAGNOSIS — E11628 Type 2 diabetes mellitus with other skin complications: Secondary | ICD-10-CM | POA: Diagnosis not present

## 2018-07-09 DIAGNOSIS — Z66 Do not resuscitate: Secondary | ICD-10-CM | POA: Diagnosis not present

## 2018-07-09 DIAGNOSIS — M869 Osteomyelitis, unspecified: Secondary | ICD-10-CM | POA: Diagnosis not present

## 2018-07-09 DIAGNOSIS — I825Z2 Chronic embolism and thrombosis of unspecified deep veins of left distal lower extremity: Secondary | ICD-10-CM | POA: Diagnosis present

## 2018-07-09 DIAGNOSIS — I252 Old myocardial infarction: Secondary | ICD-10-CM | POA: Diagnosis not present

## 2018-07-09 DIAGNOSIS — F17210 Nicotine dependence, cigarettes, uncomplicated: Secondary | ICD-10-CM | POA: Diagnosis present

## 2018-07-09 DIAGNOSIS — E1169 Type 2 diabetes mellitus with other specified complication: Secondary | ICD-10-CM | POA: Diagnosis present

## 2018-07-09 DIAGNOSIS — I214 Non-ST elevation (NSTEMI) myocardial infarction: Secondary | ICD-10-CM | POA: Diagnosis present

## 2018-07-09 DIAGNOSIS — E1151 Type 2 diabetes mellitus with diabetic peripheral angiopathy without gangrene: Secondary | ICD-10-CM | POA: Diagnosis present

## 2018-07-09 DIAGNOSIS — R339 Retention of urine, unspecified: Secondary | ICD-10-CM | POA: Diagnosis present

## 2018-07-09 DIAGNOSIS — R0602 Shortness of breath: Secondary | ICD-10-CM

## 2018-07-09 DIAGNOSIS — Z9862 Peripheral vascular angioplasty status: Secondary | ICD-10-CM | POA: Diagnosis not present

## 2018-07-09 DIAGNOSIS — Z794 Long term (current) use of insulin: Secondary | ICD-10-CM

## 2018-07-09 DIAGNOSIS — L97428 Non-pressure chronic ulcer of left heel and midfoot with other specified severity: Secondary | ICD-10-CM | POA: Diagnosis not present

## 2018-07-09 DIAGNOSIS — Z79899 Other long term (current) drug therapy: Secondary | ICD-10-CM

## 2018-07-09 DIAGNOSIS — R000 Tachycardia, unspecified: Secondary | ICD-10-CM | POA: Diagnosis not present

## 2018-07-09 DIAGNOSIS — Z09 Encounter for follow-up examination after completed treatment for conditions other than malignant neoplasm: Secondary | ICD-10-CM

## 2018-07-09 DIAGNOSIS — Z7189 Other specified counseling: Secondary | ICD-10-CM | POA: Diagnosis not present

## 2018-07-09 DIAGNOSIS — I10 Essential (primary) hypertension: Secondary | ICD-10-CM | POA: Diagnosis present

## 2018-07-09 DIAGNOSIS — E43 Unspecified severe protein-calorie malnutrition: Secondary | ICD-10-CM | POA: Diagnosis present

## 2018-07-09 DIAGNOSIS — B9562 Methicillin resistant Staphylococcus aureus infection as the cause of diseases classified elsewhere: Secondary | ICD-10-CM | POA: Diagnosis not present

## 2018-07-09 DIAGNOSIS — L97422 Non-pressure chronic ulcer of left heel and midfoot with fat layer exposed: Secondary | ICD-10-CM | POA: Insufficient documentation

## 2018-07-09 DIAGNOSIS — Z91012 Allergy to eggs: Secondary | ICD-10-CM

## 2018-07-09 DIAGNOSIS — E1165 Type 2 diabetes mellitus with hyperglycemia: Secondary | ICD-10-CM | POA: Diagnosis present

## 2018-07-09 DIAGNOSIS — Z79891 Long term (current) use of opiate analgesic: Secondary | ICD-10-CM

## 2018-07-09 DIAGNOSIS — I959 Hypotension, unspecified: Secondary | ICD-10-CM | POA: Diagnosis present

## 2018-07-09 DIAGNOSIS — L97429 Non-pressure chronic ulcer of left heel and midfoot with unspecified severity: Secondary | ICD-10-CM | POA: Diagnosis not present

## 2018-07-09 DIAGNOSIS — E46 Unspecified protein-calorie malnutrition: Secondary | ICD-10-CM | POA: Diagnosis not present

## 2018-07-09 DIAGNOSIS — Z515 Encounter for palliative care: Secondary | ICD-10-CM | POA: Diagnosis not present

## 2018-07-09 DIAGNOSIS — I70235 Atherosclerosis of native arteries of right leg with ulceration of other part of foot: Secondary | ICD-10-CM | POA: Diagnosis not present

## 2018-07-09 DIAGNOSIS — L97519 Non-pressure chronic ulcer of other part of right foot with unspecified severity: Secondary | ICD-10-CM | POA: Diagnosis not present

## 2018-07-09 DIAGNOSIS — J9601 Acute respiratory failure with hypoxia: Secondary | ICD-10-CM | POA: Diagnosis not present

## 2018-07-09 DIAGNOSIS — I70292 Other atherosclerosis of native arteries of extremities, left leg: Secondary | ICD-10-CM | POA: Diagnosis not present

## 2018-07-09 DIAGNOSIS — I70245 Atherosclerosis of native arteries of left leg with ulceration of other part of foot: Secondary | ICD-10-CM | POA: Diagnosis not present

## 2018-07-09 DIAGNOSIS — Z7982 Long term (current) use of aspirin: Secondary | ICD-10-CM

## 2018-07-09 LAB — CBC
HCT: 32.8 % — ABNORMAL LOW (ref 36.0–46.0)
Hemoglobin: 10 g/dL — ABNORMAL LOW (ref 12.0–15.0)
MCH: 28.1 pg (ref 26.0–34.0)
MCHC: 30.5 g/dL (ref 30.0–36.0)
MCV: 92.1 fL (ref 80.0–100.0)
Platelets: 241 10*3/uL (ref 150–400)
RBC: 3.56 MIL/uL — AB (ref 3.87–5.11)
RDW: 14.7 % (ref 11.5–15.5)
WBC: 8.2 10*3/uL (ref 4.0–10.5)
nRBC: 0 % (ref 0.0–0.2)

## 2018-07-09 LAB — BASIC METABOLIC PANEL
Anion gap: 5 (ref 5–15)
BUN: 23 mg/dL (ref 8–23)
CALCIUM: 8.1 mg/dL — AB (ref 8.9–10.3)
CO2: 19 mmol/L — ABNORMAL LOW (ref 22–32)
Chloride: 118 mmol/L — ABNORMAL HIGH (ref 98–111)
Creatinine, Ser: 0.51 mg/dL (ref 0.44–1.00)
GFR calc Af Amer: >60 mL/min (ref >60)
GFR calc non Af Amer: >60 mL/min (ref >60)
Glucose, Bld: 124 mg/dL — ABNORMAL HIGH (ref 70–99)
Potassium: 3.5 mmol/L (ref 3.5–5.1)
Sodium: 142 mmol/L (ref 135–145)

## 2018-07-09 LAB — PROTIME-INR
INR: 1.32
Prothrombin Time: 16.2 seconds — ABNORMAL HIGH (ref 11.4–15.2)

## 2018-07-09 LAB — GLUCOSE, CAPILLARY: Glucose-Capillary: 380 mg/dL — ABNORMAL HIGH (ref 70–99)

## 2018-07-09 LAB — HEPARIN LEVEL (UNFRACTIONATED): Heparin Unfractionated: 1.67 IU/mL — ABNORMAL HIGH (ref 0.30–0.70)

## 2018-07-09 LAB — APTT: aPTT: 39 seconds — ABNORMAL HIGH (ref 24–36)

## 2018-07-09 MED ORDER — HEPARIN BOLUS VIA INFUSION
1800.0000 [IU] | Freq: Once | INTRAVENOUS | Status: AC
  Administered 2018-07-09: 1800 [IU] via INTRAVENOUS
  Filled 2018-07-09: qty 1800

## 2018-07-09 MED ORDER — VITAMIN C 500 MG PO TABS
500.0000 mg | ORAL_TABLET | Freq: Two times a day (BID) | ORAL | Status: DC
  Administered 2018-07-09 – 2018-07-10 (×3): 500 mg via ORAL
  Filled 2018-07-09 (×3): qty 1

## 2018-07-09 MED ORDER — POLYETHYLENE GLYCOL 3350 17 G PO PACK
17.0000 g | PACK | Freq: Every day | ORAL | Status: DC
  Administered 2018-07-09: 17 g via ORAL
  Filled 2018-07-09: qty 1

## 2018-07-09 MED ORDER — HEPARIN (PORCINE) 25000 UT/250ML-% IV SOLN
650.0000 [IU]/h | INTRAVENOUS | Status: DC
  Administered 2018-07-09: 550 [IU]/h via INTRAVENOUS
  Filled 2018-07-09: qty 250

## 2018-07-09 MED ORDER — TAMSULOSIN HCL 0.4 MG PO CAPS
0.4000 mg | ORAL_CAPSULE | Freq: Every day | ORAL | Status: DC
  Administered 2018-07-10: 0.4 mg via ORAL
  Filled 2018-07-09: qty 1

## 2018-07-09 MED ORDER — SODIUM CHLORIDE 0.9 % IV SOLN
INTRAVENOUS | Status: DC | PRN
  Administered 2018-07-09 (×2): 250 mL via INTRAVENOUS

## 2018-07-09 MED ORDER — INSULIN ASPART 100 UNIT/ML ~~LOC~~ SOLN
0.0000 [IU] | Freq: Every day | SUBCUTANEOUS | Status: DC
  Administered 2018-07-09: 5 [IU] via SUBCUTANEOUS
  Filled 2018-07-09: qty 1

## 2018-07-09 MED ORDER — ASPIRIN 81 MG PO CHEW
81.0000 mg | CHEWABLE_TABLET | Freq: Every day | ORAL | Status: DC
  Administered 2018-07-10: 81 mg via ORAL
  Filled 2018-07-09: qty 1

## 2018-07-09 MED ORDER — PANTOPRAZOLE SODIUM 40 MG PO TBEC
40.0000 mg | DELAYED_RELEASE_TABLET | Freq: Two times a day (BID) | ORAL | Status: DC
  Administered 2018-07-09 – 2018-07-11 (×4): 40 mg via ORAL
  Filled 2018-07-09 (×4): qty 1

## 2018-07-09 MED ORDER — TIOTROPIUM BROMIDE MONOHYDRATE 18 MCG IN CAPS
18.0000 ug | ORAL_CAPSULE | Freq: Every day | RESPIRATORY_TRACT | Status: DC
  Administered 2018-07-10 – 2018-07-11 (×2): 18 ug via RESPIRATORY_TRACT
  Filled 2018-07-09: qty 5

## 2018-07-09 MED ORDER — FERROUS SULFATE 325 (65 FE) MG PO TABS
325.0000 mg | ORAL_TABLET | Freq: Two times a day (BID) | ORAL | Status: DC
  Administered 2018-07-10 (×2): 325 mg via ORAL
  Filled 2018-07-09 (×3): qty 1

## 2018-07-09 MED ORDER — HYDROCODONE-ACETAMINOPHEN 5-325 MG PO TABS
1.0000 | ORAL_TABLET | Freq: Four times a day (QID) | ORAL | Status: DC | PRN
  Administered 2018-07-09 – 2018-07-10 (×5): 2 via ORAL
  Filled 2018-07-09 (×5): qty 2

## 2018-07-09 MED ORDER — ONDANSETRON HCL 4 MG/2ML IJ SOLN: 4.0000 mg | Freq: Four times a day (QID) | INTRAMUSCULAR | Status: DC | PRN

## 2018-07-09 MED ORDER — PREMIER PROTEIN SHAKE: 11.0000 [oz_av] | Freq: Two times a day (BID) | ORAL | Status: DC

## 2018-07-09 MED ORDER — VANCOMYCIN HCL IN DEXTROSE 750-5 MG/150ML-% IV SOLN
750.0000 mg | INTRAVENOUS | Status: DC
  Administered 2018-07-09: 750 mg via INTRAVENOUS
  Filled 2018-07-09: qty 150

## 2018-07-09 MED ORDER — INSULIN GLARGINE 100 UNIT/ML ~~LOC~~ SOLN
10.0000 [IU] | Freq: Every day | SUBCUTANEOUS | Status: DC
  Administered 2018-07-09: 10 [IU] via SUBCUTANEOUS
  Filled 2018-07-09 (×2): qty 0.1

## 2018-07-09 MED ORDER — ALPRAZOLAM 0.5 MG PO TABS: 0.5000 mg | ORAL_TABLET | Freq: Two times a day (BID) | ORAL | Status: DC | PRN

## 2018-07-09 MED ORDER — MORPHINE SULFATE (PF) 4 MG/ML IV SOLN
4.0000 mg | INTRAVENOUS | Status: DC | PRN
  Administered 2018-07-09 – 2018-07-10 (×3): 4 mg via INTRAVENOUS
  Filled 2018-07-09 (×3): qty 1

## 2018-07-09 MED ORDER — ALBUTEROL SULFATE (2.5 MG/3ML) 0.083% IN NEBU: 2.5000 mg | INHALATION_SOLUTION | Freq: Four times a day (QID) | RESPIRATORY_TRACT | Status: DC | PRN

## 2018-07-09 MED ORDER — PIPERACILLIN-TAZOBACTAM 3.375 G IVPB
3.3750 g | Freq: Three times a day (TID) | INTRAVENOUS | Status: DC
  Administered 2018-07-09 – 2018-07-11 (×5): 3.375 g via INTRAVENOUS
  Filled 2018-07-09 (×5): qty 50

## 2018-07-09 MED ORDER — NICOTINE 14 MG/24HR TD PT24: 14.0000 mg | MEDICATED_PATCH | Freq: Every day | TRANSDERMAL | Status: DC | PRN

## 2018-07-09 MED ORDER — FUROSEMIDE 40 MG PO TABS
40.0000 mg | ORAL_TABLET | Freq: Every day | ORAL | Status: DC
  Administered 2018-07-09 – 2018-07-10 (×2): 40 mg via ORAL
  Filled 2018-07-09 (×2): qty 1

## 2018-07-09 MED ORDER — ACETAMINOPHEN 650 MG RE SUPP: 650.0000 mg | Freq: Four times a day (QID) | RECTAL | Status: DC | PRN

## 2018-07-09 MED ORDER — MOMETASONE FURO-FORMOTEROL FUM 200-5 MCG/ACT IN AERO
2.0000 | INHALATION_SPRAY | Freq: Two times a day (BID) | RESPIRATORY_TRACT | Status: DC
  Administered 2018-07-09 – 2018-07-11 (×4): 2 via RESPIRATORY_TRACT
  Filled 2018-07-09: qty 8.8

## 2018-07-09 MED ORDER — ALBUTEROL SULFATE HFA 108 (90 BASE) MCG/ACT IN AERS: 2.0000 | INHALATION_SPRAY | Freq: Four times a day (QID) | RESPIRATORY_TRACT | Status: DC | PRN

## 2018-07-09 MED ORDER — POLYETHYLENE GLYCOL 3350 17 G PO PACK
17.0000 g | PACK | Freq: Every day | ORAL | Status: DC | PRN
  Filled 2018-07-09: qty 1

## 2018-07-09 MED ORDER — INSULIN ASPART 100 UNIT/ML ~~LOC~~ SOLN
0.0000 [IU] | Freq: Three times a day (TID) | SUBCUTANEOUS | Status: DC
  Administered 2018-07-10: 9 [IU] via SUBCUTANEOUS
  Administered 2018-07-10: 7 [IU] via SUBCUTANEOUS
  Administered 2018-07-10: 9 [IU] via SUBCUTANEOUS
  Filled 2018-07-09 (×4): qty 1

## 2018-07-09 MED ORDER — ONDANSETRON HCL 4 MG PO TABS: 4.0000 mg | ORAL_TABLET | Freq: Four times a day (QID) | ORAL | Status: DC | PRN

## 2018-07-09 MED ORDER — ATORVASTATIN CALCIUM 20 MG PO TABS
40.0000 mg | ORAL_TABLET | Freq: Every day | ORAL | Status: DC
  Administered 2018-07-09 – 2018-07-10 (×2): 40 mg via ORAL
  Filled 2018-07-09 (×2): qty 2

## 2018-07-09 MED ORDER — ACETAMINOPHEN 325 MG PO TABS: 650.0000 mg | ORAL_TABLET | Freq: Four times a day (QID) | ORAL | Status: DC | PRN

## 2018-07-09 NOTE — Progress Notes (Signed)
Pharmacy Antibiotic Note  April [name] is a 63 y.o. female admitted on 07/09/2018 with Wound infection.  Pharmacy has been consulted for pip/tazo and vancomycin dosing.   Plan: Will start vancomycin 750 mg q36H. Predicted AUC 487. Goal AUC is 400-550. Used Scr 0.8 for calculation.   Vd 26.4, Ke 0.039, t1/2 17.8   Will start pip/tazo 3.375 q8H.      Temp (24hrs), Avg:97.6 F (36.4 C), Min:97.6 F (36.4 C), Max:97.6 F (36.4 C)  Recent Labs  Lab 07/09/18 1758 07/09/18 1842  WBC  --  8.2  CREATININE 0.51  --     CrCl cannot be calculated (Unknown ideal weight.).    Allergies  Allergen Reactions  . Eggs Or Egg-Derived Products Diarrhea, Itching and Nausea And Vomiting    Antimicrobials this admission: 1/8 vancomcyin >>  1/8 pip/tazo >>   Dose adjustments this admission: none  Microbiology results: 1/8 Heel Cx: pending   Thank you for allowing pharmacy to be a part of this patient's care.  Ronnald Ramp, PharmD, BCPS Clinical Pharmacist  07/09/2018 7:05 PM

## 2018-07-09 NOTE — H&P (Addendum)
[employer] [facility] [place] [facility]   PATIENT NAME: April [name]    MR#:  [id number]  DATE OF BIRTH:  [birth year]  DATE OF ADMISSION:  07/09/2018  PRIMARY CARE PHYSICIAN: Mickel FuchsWroth, Thomas H, MD   REQUESTING/REFERRING PHYSICIAN: Dr. Alberteen Spindleline  CHIEF COMPLAINT:  Foot infection  HISTORY OF PRESENT ILLNESS:  April [name]  is a 63 y.o. 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  She follows with Dr. Alberteen Spindleline as an outpatient.  She endorses pain of her bilateral lower extremities.  She endorses drainage from her right foot wound.  Denies fevers or chills.  PAST MEDICAL HISTORY:   Past Medical History:  Diagnosis Date  . Anxiety   . Colon polyps   . COPD (chronic obstructive pulmonary disease) (HCC)   . Diabetes mellitus without complication (HCC)   . DVT (deep venous thrombosis) (HCC)   . DVT (deep venous thrombosis) (HCC)   . History of colonic polyps   . Hyperlipidemia   . Peripheral artery disease (HCC)   . Pulmonary nodules/lesions, multiple 08/2012   NEGATIVE PET SCAN  . Retroperitoneal abscess (HCC)    history of in 04/2013  . Tobacco abuse     PAST SURGICAL HISTORY:   Past Surgical History:  Procedure Laterality Date  . CHOLECYSTECTOMY    . COLONOSCOPY  05/2007  . IRRIGATION AND DEBRIDEMENT FOOT Bilateral 05/25/2018   Procedure: IRRIGATION AND DEBRIDEMENT FOOT application wound vac left  foot;  Surgeon: Recardo Evangelistroxler, Matthew, DPM;  Location: [facility] ORS;  Service: Podiatry;  Laterality: Bilateral;  . LOWER EXTREMITY ANGIOGRAPHY Left 05/26/2018   Procedure: Lower Extremity Angiography with possible intervention;  Surgeon: Annice Needyew, Jason S, MD;  Location: [facility] INVASIVE CV LAB;  Service: Cardiovascular;  Laterality: Left;  . LOWER EXTREMITY ANGIOGRAPHY Right 05/27/2018   Procedure: Lower  Extremity Angiography;  Surgeon: Renford DillsSchnier, Gregory G, MD;  [facility];  Service: Cardiovascular;  Laterality: Right;    SOCIAL HISTORY:   Social History   Tobacco Use  . Smoking status: Current Every Day Smoker    Packs/day: 1.50    Years: 47.00    Pack years: 70.50    Types: Cigarettes  . Smokeless tobacco: Never Used  . Tobacco comment: Smoking History 2.5-started at age 63  Substance Use Topics  . Alcohol use: No    Alcohol/week: 0.0 standard drinks    FAMILY HISTORY:   Family History  Problem Relation Age of Onset  . Brain cancer Mother     DRUG ALLERGIES:   Allergies  Allergen Reactions  . Eggs Or Egg-Derived Products Diarrhea, Itching and Nausea And Vomiting    REVIEW OF SYSTEMS:   Review of Systems  Constitutional: Negative for chills and fever.  HENT: Negative for congestion and sore throat.   Eyes: Negative for blurred vision and double vision.  Respiratory: Negative for cough and shortness of breath.   Cardiovascular: Negative for chest pain, palpitations and leg swelling.  Gastrointestinal: Negative for nausea and vomiting.  Genitourinary: Negative for dysuria and urgency.  Musculoskeletal: Positive for joint pain. Negative for back pain and falls.  Neurological: Negative for dizziness and headaches.  Psychiatric/Behavioral: Negative for depression. The patient is nervous/anxious.     MEDICATIONS AT HOME:   Prior to Admission medications   Medication Sig Start Date End Date Taking? Authorizing Provider  albuterol (PROVENTIL HFA;VENTOLIN HFA) 108 (90 Base) MCG/ACT inhaler Inhale 2 puffs into the lungs every 6 (six) hours as needed for wheezing or shortness of breath.    [provider]  albuterol (PROVENTIL) (2.5 MG/3ML) 0.083% nebulizer solution Inhale 2.5 mg into the lungs every 6 (six) hours as needed for wheezing or shortness of breath.     [provider]  ALPRAZolam Prudy Feeler) 0.5 MG tablet Take 1 tablet (0.5 mg  total) by mouth 2 (two) times daily as needed for anxiety. 06/13/18   Altamese Dilling, MD  aspirin 81 MG chewable tablet Chew 81 mg by mouth daily. 05/30/17   [provider]  atorvastatin (LIPITOR) 40 MG tablet Take 40 mg by mouth daily. 10/24/17 10/24/18  [provider]  ferrous sulfate 325 (65 FE) MG tablet Take 1 tablet (325 mg total) by mouth 2 (two) times daily with a meal. 06/01/18 07/01/18  Pyreddy, Vivien Rota, MD  Fluticasone-Salmeterol (ADVAIR DISKUS) 250-50 MCG/DOSE AEPB Inhale 1 puff into the lungs 2 (two) times daily. 03/14/15 01/16/26  Beers, Charmayne Sheer, PA-C  furosemide (LASIX) 40 MG tablet Take 1 tablet (40 mg total) by mouth daily. 06/14/18   Altamese Dilling, MD  guaiFENesin-dextromethorphan (ROBITUSSIN DM) 100-10 MG/5ML syrup Take 5 mLs by mouth every 4 (four) hours as needed for cough. 04/11/18   Milagros Loll, MD  HYDROcodone-acetaminophen (NORCO/VICODIN) 5-325 MG tablet Take 1-2 tablets by mouth every 6 (six) hours as needed for moderate pain or severe pain. 06/01/18   Ihor Austin, MD  insulin glargine (LANTUS) 100 unit/mL SOPN Inject 0.1 mLs (10 Units total) into the skin daily. 06/13/18   Altamese Dilling, MD  metFORMIN (GLUCOPHAGE) 500 MG tablet Take 1 tablet (500 mg total) by mouth 2 (two) times daily with a meal. 06/13/18   Altamese Dilling, MD  pantoprazole (PROTONIX) 40 MG tablet Take 40 mg by mouth 2 (two) times daily.  04/25/18   [provider]  polyethylene glycol (MIRALAX / GLYCOLAX) packet Take 17 g by mouth daily. 06/14/18   Altamese Dilling, MD  potassium chloride (K-DUR,KLOR-CON) 10 MEQ tablet Take 1 tablet (10 mEq total) by mouth daily. 06/13/18   Altamese Dilling, MD  protein supplement shake (PREMIER PROTEIN) LIQD Take 325 mLs (11 oz total) by mouth 2 (two) times daily between meals. 06/14/18   Altamese Dilling, MD  rivaroxaban (XARELTO) 10 MG TABS tablet Take 10 mg by mouth every evening.     [provider]  SANTYL ointment Apply 1 application topically daily.  05/19/18   [provider]  sodium hypochlorite (DAKIN'S 1/4 STRENGTH) 0.125 % SOLN Apply 1 application topically daily. Use as a wound cleanser, pat dry, then apply dressings 12/18/17   [provider]  SPIRIVA HANDIHALER 18 MCG inhalation capsule Place 18 mcg into inhaler and inhale daily.  05/19/18   [provider]  tamsulosin (FLOMAX) 0.4 MG CAPS capsule Take 1 capsule (0.4 mg total) by mouth daily. 06/14/18   Altamese Dilling, MD  vitamin C (VITAMIN C) 500 MG tablet Take 1 tablet (500 mg total) by mouth 2 (two) times daily. 06/13/18   Altamese Dilling, MD      VITAL SIGNS:  Blood pressure 123/65, pulse 90, temperature 97.6 F (36.4 C), temperature source Oral, SpO2 100 %.  PHYSICAL EXAMINATION:  Physical Exam  GENERAL:  63 y.o.-year-old patient lying in the bed with no acute distress.  Cachectic-appearing. EYES: Pupils equal, round, reactive to light and accommodation. No scleral icterus. Extraocular muscles intact.  HEENT: Head  atraumatic, normocephalic. Oropharynx and nasopharynx clear.  NECK:  Supple, no jugular venous distention. No thyroid enlargement, no tenderness.  LUNGS: Normal breath sounds bilaterally, no wheezing, rales,rhonchi or crepitation. No use of accessory muscles of respiration.  CARDIOVASCULAR: RRR, S1, S2 normal. No murmurs, rubs, or gallops.  ABDOMEN: Soft, nontender, nondistended. Bowel sounds present. No organomegaly or mass.  EXTREMITIES: No pedal edema, cyanosis, or clubbing.  NEUROLOGIC: Cranial nerves II through XII are intact. +global weakness. Sensation intact. Gait not checked.  PSYCHIATRIC: The patient is alert and oriented x 3.  SKIN: Foot wounds, see pictures below.         LABORATORY PANEL:   CBC Recent Labs  Lab 07/09/18 1842  WBC 8.2  HGB 10.0*  HCT 32.8*  PLT 241    ------------------------------------------------------------------------------------------------------------------  Chemistries  Recent Labs  Lab 07/09/18 1758  NA 142  K 3.5  CL 118*  CO2 19*  GLUCOSE 124*  BUN 23  CREATININE 0.51  CALCIUM 8.1*   ------------------------------------------------------------------------------------------------------------------  Cardiac Enzymes No results for input(s): TROPONINI in the last 168 hours. ------------------------------------------------------------------------------------------------------------------  RADIOLOGY:  No results found.    IMPRESSION AND PLAN:   Bilateral infected diabetic foot wounds- present on the plantar surface of her right foot and the heel of her left foot.  No signs of sepsis on admission. -Podiatry consult -Vascular surgery consult -Start empiric antibiotics -Follow-up wound culture -Check CBC -Pain control  Recurrent left lower extremity DVT-on Xarelto at home. -Hold Xarelto for possible surgery -Place on heparin drip  COPD-stable, no signs of acute exacerbation -Continue home inhalers  Uncontrolled type 2 diabetes- recent A1c 10.9. -Continue home Lantus and SSI  Hyperlipidemia-stable -Continue home statin  Anxiety-stable -Continue home Xanax  Tobacco abuse -Nicotine patch as needed  Severe protein-calorie malnutrition -Continue home Premier protein twice daily between meals   All the records are reviewed and case discussed with ED provider. Management plans discussed with the patient, family and they are in agreement.  CODE STATUS: DNI  TOTAL TIME TAKING CARE OF THIS PATIENT: 45 minutes.    Jinny BlossomKaty D  M.D on 07/09/2018 at 7:03 PM  Between 7am to 6pm - Pager - [phone]  After 6pm go to [email or web address] - Social research officer, governmentpassword EPAS [facility]  [facility]  Office  [phone]  CC: Primary care physician; Mickel FuchsWroth, Thomas H, MD   Note: This dictation was prepared  with Dragon dictation along with smaller phrase technology. Any transcriptional errors that result from this process are unintentional.

## 2018-07-09 NOTE — Progress Notes (Signed)
Family Meeting Note  Advance Directive:yes  Today a meeting took place with the Patient.  Patient is able to participate.  The following clinical team members were present during this meeting:MD  The following were discussed:Patient's diagnosis: diabetic foot ulcer, Patient's progosis: Unable to determine and Goals for treatment: DNI   Patient with multiple chronic medical conditions and severe protein calorie malnutrition.  We discussed CODE STATUS in detail. I advised that patient be DNR, due to her multiple comorbidities and fragile state.  Patient states that she would like to undergo cardiac resuscitation.  She would like to be DNI, because she does not want to ever be intubated.  Additional follow-up to be provided: prn  Time spent during discussion:20 minutes  Hilton Sinclair, MD

## 2018-07-09 NOTE — Progress Notes (Signed)
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  Allergies  Allergen Reactions  . Eggs Or Egg-Derived Products Diarrhea, Itching and Nausea And Vomiting    Patient Measurements:   Heparin Dosing Weight: 36.6 kg - reported by Jacqulyn Cane RN  Vital Signs: Temp: 97.6 F (36.4 C) (01/08 1541) Temp Source: Oral (01/08 1541) BP: 123/65 (01/08 1541) Pulse Rate: 90 (01/08 1541)  Labs: No results for input(s): HGB, HCT, PLT, APTT, LABPROT, INR, HEPARINUNFRC, HEPRLOWMOCWT, CREATININE, CKTOTAL, CKMB, TROPONINI in the last 72 hours.  CrCl cannot be calculated (Patient's most recent lab result is older than the maximum 21 days allowed.).   Medical History: Past Medical History:  Diagnosis Date  . Anxiety   . Colon polyps   . COPD (chronic obstructive pulmonary disease) (HCC)   . Diabetes mellitus without complication (HCC)   . DVT (deep venous thrombosis) (HCC)   . DVT (deep venous thrombosis) (HCC)   . History of colonic polyps   . Hyperlipidemia   . Peripheral artery disease (HCC)   . Pulmonary nodules/lesions, multiple 08/2012   NEGATIVE PET SCAN  . Retroperitoneal abscess (HCC)    history of in 04/2013  . Tobacco abuse     Medications:  Scheduled:  . atorvastatin  40 mg Oral q1800  . ferrous sulfate  325 mg Oral BID WC  . furosemide  40 mg Oral Daily  . insulin aspart  0-5 Units Subcutaneous QHS  . [START ON 07/10/2018] insulin aspart  0-9 Units Subcutaneous TID WC  . insulin glargine  10 Units Subcutaneous QHS  . mometasone-formoterol  2 puff Inhalation BID  . pantoprazole  40 mg Oral BID  . polyethylene glycol  17 g Oral Daily  . [START ON 07/10/2018] protein supplement shake  11 oz Oral BID BM  . [START ON 07/10/2018] tamsulosin  0.4 mg Oral Daily  . [START ON 07/10/2018] tiotropium  18 mcg Inhalation Daily  . ascorbic acid  500 mg Oral BID    Infusions:   PRN: acetaminophen **OR** acetaminophen, albuterol, ALPRAZolam, HYDROcodone-acetaminophen, ondansetron **OR** ondansetron (ZOFRAN) IV, polyethylene glycol  Assessment: Pharmacy was consulted for heparin.  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 Will follow aPTT.   Goal of Therapy:  APTT level 66-102 seconds or Heparin level 0.3-0.7 units/ml once heparin level correlates with aPTT  Monitor platelets by anticoagulation protocol: Yes   Plan:  Give 1800 units bolus x 1 Start heparin infusion at 550 units/hr Continue to monitor H&H and platelets  Will order aPTT in 6 hours and adjusted as necessary.   Ronnald Ramp, PharmD, BCPS 07/09/2018,5:20 PM

## 2018-07-10 ENCOUNTER — Other Ambulatory Visit: Payer: Self-pay

## 2018-07-10 ENCOUNTER — Inpatient Hospital Stay: Payer: Medicare HMO

## 2018-07-10 ENCOUNTER — Other Ambulatory Visit (INDEPENDENT_AMBULATORY_CARE_PROVIDER_SITE_OTHER): Payer: Self-pay | Admitting: Vascular Surgery

## 2018-07-10 DIAGNOSIS — E785 Hyperlipidemia, unspecified: Secondary | ICD-10-CM

## 2018-07-10 DIAGNOSIS — F17210 Nicotine dependence, cigarettes, uncomplicated: Secondary | ICD-10-CM

## 2018-07-10 DIAGNOSIS — L97428 Non-pressure chronic ulcer of left heel and midfoot with other specified severity: Secondary | ICD-10-CM

## 2018-07-10 DIAGNOSIS — L97514 Non-pressure chronic ulcer of other part of right foot with necrosis of bone: Secondary | ICD-10-CM

## 2018-07-10 DIAGNOSIS — I70244 Atherosclerosis of native arteries of left leg with ulceration of heel and midfoot: Secondary | ICD-10-CM

## 2018-07-10 DIAGNOSIS — E1151 Type 2 diabetes mellitus with diabetic peripheral angiopathy without gangrene: Secondary | ICD-10-CM

## 2018-07-10 DIAGNOSIS — I70235 Atherosclerosis of native arteries of right leg with ulceration of other part of foot: Secondary | ICD-10-CM

## 2018-07-10 LAB — BASIC METABOLIC PANEL
Anion gap: 9 (ref 5–15)
BUN: 34 mg/dL — ABNORMAL HIGH (ref 8–23)
CO2: 27 mmol/L (ref 22–32)
Calcium: 8.2 mg/dL — ABNORMAL LOW (ref 8.9–10.3)
Chloride: 98 mmol/L (ref 98–111)
Creatinine, Ser: 1.04 mg/dL — ABNORMAL HIGH (ref 0.44–1.00)
GFR calc Af Amer: >60 mL/min (ref >60)
GFR calc non Af Amer: 57 mL/min — ABNORMAL LOW (ref >60)
GLUCOSE: 371 mg/dL — AB (ref 70–99)
Potassium: 4 mmol/L (ref 3.5–5.1)
Sodium: 134 mmol/L — ABNORMAL LOW (ref 135–145)

## 2018-07-10 LAB — CBC
HEMATOCRIT: 29.2 % — AB (ref 36.0–46.0)
Hemoglobin: 8.9 g/dL — ABNORMAL LOW (ref 12.0–15.0)
MCH: 27.9 pg (ref 26.0–34.0)
MCHC: 30.5 g/dL (ref 30.0–36.0)
MCV: 91.5 fL (ref 80.0–100.0)
Platelets: 233 10*3/uL (ref 150–400)
RBC: 3.19 MIL/uL — ABNORMAL LOW (ref 3.87–5.11)
RDW: 14.7 % (ref 11.5–15.5)
WBC: 7.8 10*3/uL (ref 4.0–10.5)
nRBC: 0 % (ref 0.0–0.2)

## 2018-07-10 LAB — APTT
aPTT: 84 seconds — ABNORMAL HIGH (ref 24–36)
aPTT: 84 seconds — ABNORMAL HIGH (ref 24–36)

## 2018-07-10 LAB — GLUCOSE, CAPILLARY
Glucose-Capillary: 381 mg/dL — ABNORMAL HIGH (ref 70–99)
Glucose-Capillary: 372 mg/dL — ABNORMAL HIGH (ref 70–99)
Glucose-Capillary: 303 mg/dL — ABNORMAL HIGH (ref 70–99)

## 2018-07-10 LAB — MRSA PCR SCREENING: MRSA by PCR: POSITIVE — AB

## 2018-07-10 LAB — VANCOMYCIN, RANDOM: Vancomycin Rm: 7

## 2018-07-10 MED ORDER — INSULIN ASPART 100 UNIT/ML ~~LOC~~ SOLN
3.0000 [IU] | Freq: Three times a day (TID) | SUBCUTANEOUS | Status: DC
  Administered 2018-07-10 – 2018-07-11 (×3): 3 [IU] via SUBCUTANEOUS
  Filled 2018-07-10 (×4): qty 1

## 2018-07-10 MED ORDER — INSULIN GLARGINE 100 UNIT/ML ~~LOC~~ SOLN
8.0000 [IU] | Freq: Every day | SUBCUTANEOUS | Status: DC
  Administered 2018-07-10: 8 [IU] via SUBCUTANEOUS
  Filled 2018-07-10 (×2): qty 0.08

## 2018-07-10 MED ORDER — VANCOMYCIN HCL IN DEXTROSE 1-5 GM/200ML-% IV SOLN
1000.0000 mg | INTRAVENOUS | Status: DC
  Administered 2018-07-10: 1000 mg via INTRAVENOUS
  Filled 2018-07-10 (×2): qty 200

## 2018-07-10 MED ORDER — INSULIN GLARGINE 100 UNIT/ML ~~LOC~~ SOLN
15.0000 [IU] | Freq: Every day | SUBCUTANEOUS | Status: DC
  Filled 2018-07-10: qty 0.15

## 2018-07-10 MED ORDER — OCUVITE-LUTEIN PO CAPS
1.0000 | ORAL_CAPSULE | Freq: Every day | ORAL | Status: DC
  Administered 2018-07-10: 1 via ORAL
  Filled 2018-07-10 (×2): qty 1

## 2018-07-10 MED ORDER — ENSURE MAX PROTEIN PO LIQD
11.0000 [oz_av] | Freq: Two times a day (BID) | ORAL | Status: DC
  Administered 2018-07-10: 11 [oz_av] via ORAL
  Filled 2018-07-10: qty 330

## 2018-07-10 MED ORDER — SODIUM CHLORIDE 0.9 % IV SOLN
INTRAVENOUS | Status: DC
  Administered 2018-07-11: via INTRAVENOUS

## 2018-07-10 NOTE — Progress Notes (Signed)
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  Allergies  Allergen Reactions  . Eggs Or Egg-Derived Products Diarrhea, Itching and Nausea And Vomiting    Patient Measurements:   Heparin Dosing Weight: 36.6 kg - reported by Jacqulyn Cane RN  Vital Signs: Temp: 98.1 F (36.7 C) (01/08 1949) Temp Source: Oral (01/08 1949) BP: 92/50 (01/08 1949) Pulse Rate: 91 (01/08 1949)  Labs: Recent Labs    07/09/18 1758 07/09/18 1842 07/09/18 1913 07/10/18 0056  HGB  --  10.0*  --  8.9*  HCT  --  32.8*  --  29.2*  PLT  --  241  --  233  APTT  --   --  39* 84*  LABPROT  --   --  16.2*  --   INR  --   --  1.32  --   HEPARINUNFRC  --   --  1.67*  --   CREATININE 0.51  --   --  1.04*    CrCl cannot be calculated (Unknown ideal weight.).   Medical History: Past Medical History:  Diagnosis Date  . Anxiety   . Colon polyps   . COPD (chronic obstructive pulmonary disease) (HCC)   . Diabetes mellitus without complication (HCC)   . DVT (deep venous thrombosis) (HCC)   . DVT (deep venous thrombosis) (HCC)   . History of colonic polyps   . Hyperlipidemia   . Peripheral artery disease (HCC)   . Pulmonary nodules/lesions, multiple 08/2012   NEGATIVE PET SCAN  . Retroperitoneal abscess (HCC)    history of in 04/2013  . Tobacco abuse     Medications:  Scheduled:  . aspirin  81 mg Oral Daily  . atorvastatin  40 mg Oral q1800  . ferrous sulfate  325 mg Oral BID WC  . furosemide  40 mg Oral Daily  . insulin aspart  0-5 Units Subcutaneous QHS  . insulin aspart  0-9 Units Subcutaneous TID WC  . insulin glargine  10 Units Subcutaneous QHS  . mometasone-formoterol  2 puff Inhalation BID  . pantoprazole  40 mg Oral BID  . polyethylene glycol  17 g Oral Daily  . protein supplement shake  11 oz Oral BID BM  . tamsulosin  0.4 mg Oral Daily  . tiotropium  18 mcg  Inhalation Daily  . ascorbic acid  500 mg Oral BID   Infusions:  . sodium chloride 250 mL (07/09/18 2102)  . heparin 550 Units/hr (07/09/18 1854)  . piperacillin-tazobactam (ZOSYN)  IV 3.375 g (07/09/18 8469)  . vancomycin 750 mg (07/09/18 2104)   PRN: sodium chloride, acetaminophen **OR** acetaminophen, albuterol, ALPRAZolam, HYDROcodone-acetaminophen, morphine injection, nicotine, ondansetron **OR** ondansetron (ZOFRAN) IV, polyethylene glycol  Assessment: Pharmacy was consulted for heparin.  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 Will follow aPTT.   Goal of Therapy:  APTT level 66-102 seconds or Heparin level 0.3-0.7 units/ml once heparin level correlates with aPTT  Monitor platelets by anticoagulation protocol: Yes   Plan:  Give 1800 units bolus x 1 Start heparin infusion at 550 units/hr Continue to monitor H&H and platelets  Will order aPTT in 6 hours and adjusted as necessary.  1/9  0100 aPTT 84. Continue current regimen. Recheck aPTT, heparin level and CBC with tomorrow AM labs.  Antwione Picotte S, PharmD, BCPS 07/10/2018,2:56 AM

## 2018-07-10 NOTE — Consult Note (Signed)
[facility] Vascular Consult Note  MRN : [id number]  April [name] is a 63 y.o. (07/07/1955) female who presents with chief complaint of No chief complaint on file.  History of Present Illness:  The patient is a 63 year old female with a past medical history of diabetic foot infection, DVT, diabetes, COPD, hyperlipidemia, protein calorie malnutrition, tobacco abuse, well-known to our service for peripheral artery disease of the bilateral lower extremities.  The patient underwent bilateral lower extremity angiograms and of November 2019.  The patient was admitted directly from her podiatrist office (Dr. Alberteen Spindle) due to progressively worsening bilateral infected foot wounds.  The patient endorses minimal healing to the wounds located on her feet.  She notes progressively worsening bilateral foot pain as well.  Endorses a history of drainage from her right foot wound.  She denies any shortness of breath or chest pain.  She denies any fever, nausea vomiting.  Patient denies claudication or rest pain.  Seems most of her discomfort is located to her bilateral feet.  Vascular surgery was consulted by Dr. Nancy Marus for possible repeat endovascular intervention.  Current Facility-Administered Medications  Medication Dose Route Frequency Provider Last Rate Last Dose  . 0.9 %  sodium chloride infusion   Intravenous PRN Mayo, Allyn Kenner, MD   Stopped at 07/10/18 0007  . acetaminophen (TYLENOL) tablet 650 mg  650 mg Oral Q6H PRN Mayo, Allyn Kenner, MD       Or  . acetaminophen (TYLENOL) suppository 650 mg  650 mg Rectal Q6H PRN Mayo, Allyn Kenner, MD      . albuterol (PROVENTIL) (2.5 MG/3ML) 0.083% nebulizer solution 2.5 mg  2.5 mg Inhalation Q6H PRN Mayo, Allyn Kenner, MD      . ALPRAZolam Prudy Feeler) tablet 0.5 mg  0.5 mg Oral BID PRN Mayo, Allyn Kenner, MD      . aspirin chewable tablet 81 mg  81 mg Oral Daily Mayo, Allyn Kenner, MD   81 mg at 07/10/18 0836  . atorvastatin (LIPITOR) tablet 40 mg  40 mg  Oral q1800 Mayo, Allyn Kenner, MD   40 mg at 07/09/18 1749  . ferrous sulfate tablet 325 mg  325 mg Oral BID WC Mayo, Allyn Kenner, MD   325 mg at 07/10/18 0836  . furosemide (LASIX) tablet 40 mg  40 mg Oral Daily Mayo, Allyn Kenner, MD   40 mg at 07/10/18 0836  . heparin ADULT infusion 100 units/mL (25000 units/268mL sodium chloride 0.45%)  550 Units/hr Intravenous Continuous Ronnald Ramp, RPH 5.5 mL/hr at 07/10/18 0300 550 Units/hr at 07/10/18 0300  . HYDROcodone-acetaminophen (NORCO/VICODIN) 5-325 MG per tablet 1-2 tablet  1-2 tablet Oral Q6H PRN Mayo, Allyn Kenner, MD   2 tablet at 07/10/18 1504  . insulin aspart (novoLOG) injection 0-5 Units  0-5 Units Subcutaneous QHS Campbell Stall, MD   5 Units at 07/09/18 2122  . insulin aspart (novoLOG) injection 0-9 Units  0-9 Units Subcutaneous TID WC Mayo, Allyn Kenner, MD   9 Units at 07/10/18 1217  . insulin aspart (novoLOG) injection 3 Units  3 Units Subcutaneous TID WC Gouru, Aruna, MD   3 Units at 07/10/18 1216  . insulin glargine (LANTUS) injection 8 Units  8 Units Subcutaneous QHS Gouru, Aruna, MD      . mometasone-formoterol (DULERA) 200-5 MCG/ACT inhaler 2 puff  2 puff Inhalation BID Mayo, Allyn Kenner, MD   2 puff at 07/10/18 [phone]  . morphine 4 MG/ML injection 4 mg  4 mg Intravenous  Q4H PRN Oralia ManisWillis, David, MD   4 mg at 07/10/18 1217  . multivitamin-lutein (OCUVITE-LUTEIN) capsule 1 capsule  1 capsule Oral Daily Gouru, Aruna, MD   1 capsule at 07/10/18 1406  . nicotine (NICODERM CQ - dosed in mg/24 hours) patch 14 mg  14 mg Transdermal Daily PRN Mayo, Allyn KennerKaty Dodd, MD      . ondansetron [facility](ZOFRAN) tablet 4 mg  4 mg Oral Q6H PRN Mayo, Allyn KennerKaty Dodd, MD       Or  . ondansetron [facility](ZOFRAN) injection 4 mg  4 mg Intravenous Q6H PRN Mayo, Allyn KennerKaty Dodd, MD      . pantoprazole (PROTONIX) EC tablet 40 mg  40 mg Oral BID Campbell StallMayo, Katy Dodd, MD   40 mg at 07/10/18 0836  . piperacillin-tazobactam (ZOSYN) IVPB 3.375 g  3.375 g Intravenous Q8H Ronnald Rampatel, Kishan S, RPH 12.5 mL/hr at 07/10/18  1406 3.375 g at 07/10/18 1406  . polyethylene glycol (MIRALAX / GLYCOLAX) packet 17 g  17 g Oral Daily Mayo, Allyn KennerKaty Dodd, MD   17 g at 07/09/18 1836  . polyethylene glycol (MIRALAX / GLYCOLAX) packet 17 g  17 g Oral Daily PRN Mayo, Allyn KennerKaty Dodd, MD      . protein supplement (ENSURE MAX) liquid  11 oz Oral BID Gouru, Aruna, MD   11 oz at 07/10/18 1407  . tamsulosin (FLOMAX) capsule 0.4 mg  0.4 mg Oral Daily Mayo, Allyn KennerKaty Dodd, MD   0.4 mg at 07/10/18 0836  . tiotropium (SPIRIVA) inhalation capsule ([facility] use ONLY) 18 mcg  18 mcg Inhalation Daily Mayo, Allyn KennerKaty Dodd, MD   18 mcg at 07/10/18 [phone]  . vancomycin (VANCOCIN) IVPB 1000 mg/200 mL premix  1,000 mg Intravenous Q24H Lowella BandyGrubb, Rodney D, RPH      . vitamin C (ASCORBIC ACID) tablet 500 mg  500 mg Oral BID Campbell StallMayo, Katy Dodd, MD   500 mg at 07/10/18 [id number]   Past Medical History:  Diagnosis Date  . Anxiety   . Colon polyps   . COPD (chronic obstructive pulmonary disease) (HCC)   . Diabetes mellitus without complication (HCC)   . DVT (deep venous thrombosis) (HCC)   . DVT (deep venous thrombosis) (HCC)   . History of colonic polyps   . Hyperlipidemia   . Peripheral artery disease (HCC)   . Pulmonary nodules/lesions, multiple 08/2012   NEGATIVE PET SCAN  . Retroperitoneal abscess (HCC)    history of in 04/2013  . Tobacco abuse    Past Surgical History:  Procedure Laterality Date  . CHOLECYSTECTOMY    . COLONOSCOPY  05/2007  . IRRIGATION AND DEBRIDEMENT FOOT Bilateral 05/25/2018   Procedure: IRRIGATION AND DEBRIDEMENT FOOT application wound vac left  foot;  Surgeon: Recardo Evangelistroxler, Matthew, DPM;  Location: [facility] ORS;  Service: Podiatry;  Laterality: Bilateral;  . LOWER EXTREMITY ANGIOGRAPHY Left 05/26/2018   Procedure: Lower Extremity Angiography with possible intervention;  Surgeon: Annice Needyew, Jason S, MD;  Location: [facility] INVASIVE CV LAB;  Service: Cardiovascular;  Laterality: Left;  . LOWER EXTREMITY ANGIOGRAPHY Right 05/27/2018   Procedure: Lower Extremity  Angiography;  Surgeon: Renford DillsSchnier, Gregory G, MD;  [facility];  Service: Cardiovascular;  Laterality: Right;   Social History Social History   Tobacco Use  . Smoking status: Current Every Day Smoker    Packs/day: 1.50    Years: 47.00    Pack years: 70.50    Types: Cigarettes  . Smokeless tobacco: Never Used  . Tobacco comment: Smoking History 2.5-started at age 63  Substance  Use Topics  . Alcohol use: No    Alcohol/week: 0.0 standard drinks  . Drug use: No    Family History Family History  Problem Relation Age of Onset  . Brain cancer Mother   Denies any family history of peripheral artery disease, venous disease or bleeding/clotting disorders.  Allergies  Allergen Reactions  . Eggs Or Egg-Derived Products Diarrhea, Itching and Nausea And Vomiting   REVIEW OF SYSTEMS (Negative unless checked)  Constitutional: [] Weight loss  [] Fever  [] Chills Cardiac: [] Chest pain   [] Chest pressure   [] Palpitations   [] Shortness of breath when laying flat   [] Shortness of breath at rest   [] Shortness of breath with exertion. Vascular:  [] Pain in legs with walking   [] Pain in legs at rest   [] Pain in legs when laying flat   [] Claudication   [x] Pain in feet when walking  [x] Pain in feet at rest  [x] Pain in feet when laying flat   [] History of DVT   [] Phlebitis   [] Swelling in legs   [] Varicose veins   [] Non-healing ulcers Pulmonary:   [] Uses home oxygen   [] Productive cough   [] Hemoptysis   [] Wheeze  [x] COPD   [] Asthma Neurologic:  [] Dizziness  [] Blackouts   [] Seizures   [] History of stroke   [] History of TIA  [] Aphasia   [] Temporary blindness   [] Dysphagia   [] Weakness or numbness in arms   [] Weakness or numbness in legs Musculoskeletal:  [] Arthritis   [] Joint swelling   [] Joint pain   [] Low back pain Hematologic:  [] Easy bruising  [] Easy bleeding   [] Hypercoagulable state   [] Anemic  [] Hepatitis Gastrointestinal:  [] Blood in stool   [] Vomiting blood  [] Gastroesophageal  reflux/heartburn   [] Difficulty swallowing. Genitourinary:  [] Chronic kidney disease   [] Difficult urination  [] Frequent urination  [] Burning with urination   [] Blood in urine Skin:  [] Rashes   [x] Ulcers   [x] Wounds Psychological:  [x] History of anxiety   []  History of major depression.  Physical Examination  Vitals:   07/09/18 1949 07/10/18 0532 07/10/18 0934 07/10/18 1319  BP: (!) 92/50 (!) 95/55  (!) 102/50  Pulse: 91 89  86  Resp: 18 18  (!) 22  Temp: 98.1 F (36.7 C) 98 F (36.7 C)  (!) 97.5 F (36.4 C)  TempSrc: Oral Oral  Oral  SpO2: 100% 100%  99%  Height:   5\' 2"  (1.575 m)    Body mass index is 20 kg/m. Gen:  WD/WN, NAD Head: [place]/AT, No temporalis wasting. Prominent temp pulse not noted. Ear/Nose/Throat: Hearing grossly intact, nares w/o erythema or drainage, oropharynx w/o Erythema/Exudate Eyes: Sclera non-icteric, conjunctiva clear Neck: Trachea midline.  No JVD.  Pulmonary:  Good air movement, respirations not labored, equal bilaterally.  Cardiac: RRR, normal S1, S2. Vascular:  Vessel Right Left  Radial Palpable Palpable  Ulnar Palpable Palpable  Brachial Palpable Palpable  Carotid Palpable, without bruit Palpable, without bruit  Aorta Not palpable N/A  Femoral Palpable Palpable  Popliteal Palpable Palpable  PT Non-Palpable Non-Palpable  DP Non-Palpable Non-Palpable   Right Lower Extremity: Unable to palpate pedal pulses.  Unable to Doppler pedal pulses. thigh is soft.  Calf is soft.  Extremity is warm.  Approximately mid foot to toes become slightly cooler.  Full thickness ulceration and wound on medial aspect of right foot.  Left Lower Extremity: Unable to palpate pedal pulses.  Unable to Doppler pedal pulses. thigh is soft.  Calf is soft.  Extremity is warm.  Approximately mid foot to toes become slightly  cooler.  Necrotic heel ulcer noted to left heel.  Gastrointestinal: soft, non-tender/non-distended. No guarding/reflex.  Musculoskeletal: M/S 5/5  throughout. No edema. Neurologic: Sensation grossly intact in extremities.  Symmetrical.  Speech is fluent. Motor exam as listed above. Psychiatric: Judgment intact, Mood & affect appropriate for pt's clinical situation. Dermatologic: See above Lymph : No Cervical, Axillary, or Inguinal lymphadenopathy.  CBC Lab Results  Component Value Date   WBC 7.8 07/10/2018   HGB 8.9 (L) 07/10/2018   HCT 29.2 (L) 07/10/2018   MCV 91.5 07/10/2018   PLT 233 07/10/2018   BMET    Component Value Date/Time   NA 134 (L) 07/10/2018 0056   NA 142 10/14/2014 0644   K 4.0 07/10/2018 0056   K 3.6 10/14/2014 0644   CL 98 07/10/2018 0056   CL 109 10/14/2014 0644   CO2 27 07/10/2018 0056   CO2 29 10/14/2014 0644   GLUCOSE 371 (H) 07/10/2018 0056   GLUCOSE 142 (H) 10/14/2014 0644   BUN 34 (H) 07/10/2018 0056   BUN 9 10/14/2014 0644   CREATININE 1.04 (H) 07/10/2018 0056   CREATININE 0.53 10/14/2014 0644   CALCIUM 8.2 (L) 07/10/2018 0056   CALCIUM 7.7 (L) 10/14/2014 0644   GFRNONAA 57 (L) 07/10/2018 0056   GFRNONAA >60 10/14/2014 0644   GFRAA >60 07/10/2018 0056   GFRAA >60 10/14/2014 0644   CrCl cannot be calculated (Unknown ideal weight.).  COAG Lab Results  Component Value Date   INR 1.32 07/09/2018   INR 1.43 05/30/2018   INR 1.41 05/29/2018   Radiology Dg Chest Port 1 View  Result Date: 06/11/2018 CLINICAL DATA:  63 year old female with cough EXAM: PORTABLE CHEST 1 VIEW COMPARISON:  Chest radiograph dated 06/07/2018 FINDINGS: There bilateral pleural effusions with bibasilar atelectasis versus infiltrate similar to prior radiograph. No pneumothorax. Borderline cardiomegaly with mild vascular congestion. No acute osseous pathology. IMPRESSION: No significant interval change. Electronically Signed   By: Elgie CollardArash  Radparvar M.D.   On: 06/11/2018 22:12   Dg Foot Complete Right  Result Date: 07/10/2018 CLINICAL DATA:  Foot ulcer. EXAM: RIGHT FOOT COMPLETE - 3+ VIEW COMPARISON:  06/03/2018  FINDINGS: Stable postsurgical resection of the distal first metatarsal bone. Again seen is irregularity of the base of the first proximal phalanx possibly due to ongoing osteomyelitis. 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 Cortical irregularity of the remaining metatarsal bone is stable from November 2019. There is a deep penetrating wound in the plantar foot at the level of the first metatarsophalangeal joint. IMPRESSION: Probable surgical resection of the distal first metatarsal bone. Deep penetrating wound at the level of the first metatarsophalangeal joint, with cortical irregularity of the base of the first proximal phalanx, suggestive possible osteomyelitis. Electronically Signed   By: Ted Mcalpineobrinka  Dimitrova M.D.   On: 07/10/2018 14:27   Assessment/Plan The patient is a 63 year old female with multiple medical issues and a known history of peripheral artery disease status post endovascular intervention to the bilateral lower extremities who presents with worsening bilateral foot wounds 1. PAD: Patient with a known history of peripheral artery disease to the bilateral lower extremities.  Most recent bilateral lower extremity endovascular intervention 11/19.  Unable to palpate or Doppler pedal pulses however the extremities are relatively warm.  In the setting of worsening bilateral foot wounds would recommend at least a diagnostic bilateral lower extremity angiogram.  Plan would be for a right lower extremity angiogram possibly Friday (schedule permitting) followed by a left lower extremity angiogram on Monday.  Patient seen and examined in the presence of her daughter and granddaughter.  Procedure, risks and benefits were explained to both the patient and her family.  All questions were answered.  The patient and her family would like to proceed. 2. Tobacco Abuse: We had a discussion for approximately three minutes regarding the absolute need for smoking cessation due to the deleterious nature of  tobacco on the vascular system. We discussed the tobacco use would diminish patency of any intervention, and likely significantly worsen progressio of disease. We discussed multiple agents for quitting including replacement therapy or medications to reduce cravings such as Chantix. The patient voices their understanding of the importance of smoking cessation. 3.  Diabetes: On appropriate medications.Encouraged good control as its slows the progression of atherosclerotic disease 4.  Hyperlipidemia: On appropriate medications.Encouraged good control as its slows the progression of atherosclerotic disease  Discussed with Dr. Gilda Crease and Dr. Weldon Inches, PA-C  07/10/2018 3:57 PM  This note was created with Dragon medical transcription system.  Any error is purely unintentional

## 2018-07-10 NOTE — Consult Note (Signed)
ORTHOPAEDIC CONSULTATION  REQUESTING PHYSICIAN: Ramonita Lab, MD  Chief Complaint: Right foot infection and left heel ulceration  HPI: April [name] is a 63 y.o. female who complains of worsening right foot infection.  Patient underwent [place] of right foot in November.  Seen in the outpatient clinic yesterday by Dr. Alberteen Spindle.  Noted full-thickness wound dehiscence with exposure of tendon in the right medial foot.  Admitted for further evaluation and possible debridement.  Patient has severe peripheral vascular disease as well.  Vascular surgery has been asked to evaluate as well.  Patient also has a left heel ulceration with necrosis.  This is painful for the patient.  Past Medical History:  Diagnosis Date  . Anxiety   . Colon polyps   . COPD (chronic obstructive pulmonary disease) (HCC)   . Diabetes mellitus without complication (HCC)   . DVT (deep venous thrombosis) (HCC)   . DVT (deep venous thrombosis) (HCC)   . History of colonic polyps   . Hyperlipidemia   . Peripheral artery disease (HCC)   . Pulmonary nodules/lesions, multiple 08/2012   NEGATIVE PET SCAN  . Retroperitoneal abscess (HCC)    history of in 04/2013  . Tobacco abuse    Past Surgical History:  Procedure Laterality Date  . CHOLECYSTECTOMY    . COLONOSCOPY  05/2007  . IRRIGATION AND DEBRIDEMENT FOOT Bilateral 05/25/2018   Procedure: IRRIGATION AND DEBRIDEMENT FOOT application wound vac left  foot;  Surgeon: Recardo Evangelist, DPM;  Location: [facility] ORS;  Service: Podiatry;  Laterality: Bilateral;  . LOWER EXTREMITY ANGIOGRAPHY Left 05/26/2018   Procedure: Lower Extremity Angiography with possible intervention;  Surgeon: Annice Needy, MD;  Location: [facility] INVASIVE CV LAB;  Service: Cardiovascular;  Laterality: Left;  . LOWER EXTREMITY ANGIOGRAPHY Right 05/27/2018   Procedure: Lower Extremity Angiography;  Surgeon: Renford Dills, MD;  Location: [facility] INVASIVE CV LAB;  Service: Cardiovascular;  Laterality: Right;    Social History   Socioeconomic History  . Marital status: Divorced    Spouse name: Not on file  . Number of children: Not on file  . Years of education: Not on file  . Highest education level: Not on file  Occupational History  . Not on file  Social Needs  . Financial resource strain: Not on file  . Food insecurity:    Worry: Not on file    Inability: Not on file  . Transportation needs:    Medical: Not on file    Non-medical: Not on file  Tobacco Use  . Smoking status: Current Every Day Smoker    Packs/day: 1.50    Years: 47.00    Pack years: 70.50    Types: Cigarettes  . Smokeless tobacco: Never Used  . Tobacco comment: Smoking History 2.5-started at age 46  Substance and Sexual Activity  . Alcohol use: No    Alcohol/week: 0.0 standard drinks  . Drug use: No  . Sexual activity: Not on file  Lifestyle  . Physical activity:    Days per week: Not on file    Minutes per session: Not on file  . Stress: Not on file  Relationships  . Social connections:    Talks on phone: Not on file    Gets together: Not on file    Attends religious service: Not on file    Active member of club or organization: Not on file    Attends meetings of clubs or organizations: Not on file    Relationship status: Not on file  Other Topics Concern  . Not on file  Social History Narrative  . Not on file   Family History  Problem Relation Age of Onset  . Brain cancer Mother    Allergies  Allergen Reactions  . Eggs Or Egg-Derived Products Diarrhea, Itching and Nausea And Vomiting   Prior to Admission medications   Medication Sig Start Date End Date Taking? Authorizing Provider  albuterol (PROVENTIL HFA;VENTOLIN HFA) 108 (90 Base) MCG/ACT inhaler Inhale 2 puffs into the lungs every 6 (six) hours as needed for wheezing or shortness of breath.    [provider]  albuterol (PROVENTIL) (2.5 MG/3ML) 0.083% nebulizer solution Inhale 2.5 mg into the lungs every 6 (six) hours as needed  for wheezing or shortness of breath.     [provider]  ALPRAZolam Prudy Feeler(XANAX) 0.5 MG tablet Take 1 tablet (0.5 mg total) by mouth 2 (two) times daily as needed for anxiety. 06/13/18   Altamese DillingVachhani, Vaibhavkumar, MD  aspirin 81 MG chewable tablet Chew 81 mg by mouth daily. 05/30/17   [provider]  atorvastatin (LIPITOR) 40 MG tablet Take 40 mg by mouth daily. 10/24/17 10/24/18  [provider]  ferrous sulfate 325 (65 FE) MG tablet Take 1 tablet (325 mg total) by mouth 2 (two) times daily with a meal. 06/01/18 07/01/18  Pyreddy, Vivien RotaPavan, MD  Fluticasone-Salmeterol (ADVAIR DISKUS) 250-50 MCG/DOSE AEPB Inhale 1 puff into the lungs 2 (two) times daily. 03/14/15 01/16/26  Beers, Charmayne Sheerharles M, PA-C  furosemide (LASIX) 40 MG tablet Take 1 tablet (40 mg total) by mouth daily. 06/14/18   Altamese DillingVachhani, Vaibhavkumar, MD  guaiFENesin-dextromethorphan (ROBITUSSIN DM) 100-10 MG/5ML syrup Take 5 mLs by mouth every 4 (four) hours as needed for cough. 04/11/18   Milagros LollSudini, Srikar, MD  HYDROcodone-acetaminophen (NORCO/VICODIN) 5-325 MG tablet Take 1-2 tablets by mouth every 6 (six) hours as needed for moderate pain or severe pain. 06/01/18   Ihor AustinPyreddy, Pavan, MD  insulin glargine (LANTUS) 100 unit/mL SOPN Inject 0.1 mLs (10 Units total) into the skin daily. 06/13/18   Altamese DillingVachhani, Vaibhavkumar, MD  metFORMIN (GLUCOPHAGE) 500 MG tablet Take 1 tablet (500 mg total) by mouth 2 (two) times daily with a meal. 06/13/18   Altamese DillingVachhani, Vaibhavkumar, MD  pantoprazole (PROTONIX) 40 MG tablet Take 40 mg by mouth 2 (two) times daily.  04/25/18   [provider]  polyethylene glycol (MIRALAX / GLYCOLAX) packet Take 17 g by mouth daily. 06/14/18   Altamese DillingVachhani, Vaibhavkumar, MD  potassium chloride (K-DUR,KLOR-CON) 10 MEQ tablet Take 1 tablet (10 mEq total) by mouth daily. 06/13/18   Altamese DillingVachhani, Vaibhavkumar, MD  protein supplement shake (PREMIER PROTEIN) LIQD Take 325 mLs (11 oz total) by mouth 2 (two) times daily between meals.  06/14/18   Altamese DillingVachhani, Vaibhavkumar, MD  rivaroxaban (XARELTO) 10 MG TABS tablet Take 10 mg by mouth every evening.    [provider]  SANTYL ointment Apply 1 application topically daily.  05/19/18   [provider]  sodium hypochlorite (DAKIN'S 1/4 STRENGTH) 0.125 % SOLN Apply 1 application topically daily. Use as a wound cleanser, pat dry, then apply dressings 12/18/17   [provider]  SPIRIVA HANDIHALER 18 MCG inhalation capsule Place 18 mcg into inhaler and inhale daily.  05/19/18   [provider]  tamsulosin (FLOMAX) 0.4 MG CAPS capsule Take 1 capsule (0.4 mg total) by mouth daily. 06/14/18   Altamese DillingVachhani, Vaibhavkumar, MD  vitamin C (VITAMIN C) 500 MG tablet Take 1 tablet (500 mg total) by mouth 2 (two) times daily. 06/13/18  Altamese DillingVachhani, Vaibhavkumar, MD   No results found.  Positive ROS: All other systems have been reviewed and were otherwise negative with the exception of those mentioned in the HPI and as above.  12 point ROS was performed.  Physical Exam: General: Alert and oriented.  No apparent distress.  Vascular:  Left foot:Dorsalis Pedis:  absent Posterior Tibial:  absent  Right foot: Dorsalis Pedis:  absent Posterior Tibial:  absent  Neuro:absent  Derm: Left heel has a necrotic ulcer measures approximately 3 and half to 4 cm in diameter.  No drainage at all.  Mild surrounding erythema to the wound itself.  No lymphangitic streaking noted.  Right foot has a full-thickness ulceration and wound on the medial to the arch.  This is proximal to the great toe is present.  This probes deep to bone at this time.  Noted deep necrotic and fibrotic tissue.  Ortho/MS: Status post right fifth ray amputation.  Patient is in mild contracture with the left heel ulceration.  Assessment: Full-thickness deep ulceration with infection right medial foot Left heel necrotic ulceration  Plan: I recommended debridement of this right medial foot and likely  placement of wound VAC.  We may have to remove further bone from the area.  We will go and plan on performing this tomorrow.  Vascular surgery has been consulted as well.  I discussed the surgical procedure with the patient in full and consent has been given.  The left heel this is a necrotic heel ulceration.  No signs of overt infection currently.  Will continue with pressure reducing modalities.  Can continue with Santyl dressing changes in the near future.  We will continue to monitor this as well.    Irean HongFowler, Cleona Doubleday A, DPM Cell [phone]   07/10/2018 1:15 PM

## 2018-07-10 NOTE — Progress Notes (Signed)
Inpatient Diabetes Program Recommendations  AACE/ADA: New Consensus Statement on Inpatient Glycemic Control  Target Ranges:  Prepandial:   less than 140 mg/dL      Peak postprandial:   less than 180 mg/dL (1-2 hours)      Critically ill patients:  140 - 180 mg/dL   Results for KEETA, OMORI (MRN [id number]) as of 07/10/2018 11:05  Ref. Range 07/09/2018 21:10 07/10/2018 07:47  Glucose-Capillary Latest Ref Range: 70 - 99 mg/dL 149 (H)  Novolog 5 units  Lantus 10 units 381 (H)  Novolog 9 units   Review of Glycemic Control  Diabetes history: DM2 Outpatient Diabetes medications: Lantus 10 units daily, Metformin 500 mg BID Current orders for Inpatient glycemic control: Lantus 10 units QHS, Novolog 0-9 units TID with meals, Novolog 0-5 units QHS  Inpatient Diabetes Program Recommendations:   Insulin - Basal: Please consider increasing Lantus to 15 units daily.  Insulin - Meal Coverage: Please consider ordering Novolog 3 units TID with meals for meal coverage if patient eats at least 50% of meals.  Thanks, Orlando Penner, RN, MSN, CDE Diabetes Coordinator Inpatient Diabetes Program [phone] (Team Pager from 8am to 5pm)

## 2018-07-10 NOTE — Progress Notes (Addendum)
Pharmacy Antibiotic Note  April [name] is a 63 y.o. female admitted on 07/09/2018 with Wound infection.  Pharmacy has been consulted for pip/tazo and vancomycin dosing. Since admission her SCr has doubled but appears to be close to her baseline now of ~0.90 mg/dL. She has received only the first dose of vancomycin.  During an admission here in November she received vancomycin for the same indication and her regimen was 1 gram every 24 hours resulting in a Vt of 12 mcg/mL. Dr Ether Griffins is doing a debridement of her right medial foot and likely placement of wound VAC tomorrow 1/10  Plan: 1) Vancomycin was started at a dose of 750 mg q36H (no loading dose) with the following pharmacokinetic parameters used to predict the appropriate dose:   Predicted AUC 487. Goal AUC is 400-550. Used Scr 0.8 for calculation. Vd 26.4, Ke 0.039, t1/2 17.8   01/09 random vancomycin level: 89mcg/mL  She is clearing vancomycin as expected. We will begin with a new dose of 1 gram every 24 hours. We will avoid a loading dose given her current volatility in SCr. If she remains on this antibiotic regimen we will draw a peak and trough surrounding the 4th dose or a random level if SCr remains volatile.  The following pharmacokinetic parameters used to predict the appropriate dose: Ke 0.049, T1/2: 14.2h, Css: 43.2/14.1 mcg/mL   2) continue Zosyn at 3.375 EI every 8 hours   Height: 5\' 2"  (157.5 cm) IBW/kg (Calculated) : 50.1  Temp (24hrs), Avg:97.8 F (36.6 C), Min:97.5 F (36.4 C), Max:98.1 F (36.7 C)  Recent Labs  Lab 07/09/18 1758 07/09/18 1842 07/10/18 0056  WBC  --  8.2 7.8  CREATININE 0.51  --  1.04*    CrCl cannot be calculated (Unknown ideal weight.).    Antimicrobials this admission: 1/8 vancomcyin >>  1/8 pip/tazo >>   Dose adjustments this admission: 1/9 Vancomycin 750mg  every 36 hours-----> 1000mg  q24h  Microbiology results: 1/8 Heel Cx: abundant GPC  Thank you for allowing pharmacy to be a  part of this patient's care.  Lowella Bandy, PharmD Clinical Pharmacist  07/10/2018 2:44 PM

## 2018-07-10 NOTE — Progress Notes (Signed)
Initial Nutrition Assessment  DOCUMENTATION CODES:   Severe malnutrition in context of chronic illness  INTERVENTION:   Ensure Max protein supplement BID, each supplement provides 150kcal and 30g of protein.  Ocuvite daily for wound healing (provides zinc, vitamin A, vitamin C, Vitamin E, copper, and selenium)  Vitamin C 500mg  po BID  Liberalize diet   NUTRITION DIAGNOSIS:   Severe Malnutrition related to chronic illness(COPD, DM) as evidenced by severe fat depletion, severe muscle depletion.  GOAL:   Patient will meet greater than or equal to 90% of their needs  MONITOR:   PO intake, Supplement acceptance, Weight trends, Labs, I & O's, Skin  REASON FOR ASSESSMENT:   Malnutrition Screening Tool    ASSESSMENT:    63 y.o. 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   Pt is well known to nutrition department and this RD from multiple previous admits. Pt with fair appetite and oral intake at baseline; pt generally eats well, small meals. Pt does enjoy drinking chocolate Premier Protein and she drinks these at home, but not regularly. Per chart, pt with 21lb(23%) wt loss from January 2019-October 2019 which is significant. Pt had regained ~10lbs when she was admitted here in November and December 2019. RD is unsure of pt's current weight as pt has not yet been weighed this admission. RD will add supplements and vitamins to support wound healing. Will liberalize the heart healthy portion of pt's diet as this restricts protein.   Medications reviewed and include: aspirin, ferrous sulfate, lasix, insulin, protonix, miralax, vitamin C, heparin, zosyn, vancomycin   Labs reviewed: Na 134(L), BUN 34(H), creat 1.04(H) Hgb 8.9(L), Hct 29.2(L) Glucose- 380 AIC 10.9(H)- 11/24  NUTRITION - FOCUSED PHYSICAL  EXAM:    Most Recent Value  Orbital Region  Moderate depletion  Upper Arm Region  Severe depletion  Thoracic and Lumbar Region  Severe depletion  Buccal Region  Moderate depletion  Temple Region  Moderate depletion  Clavicle Bone Region  Severe depletion  Clavicle and Acromion Bone Region  Severe depletion  Scapular Bone Region  Severe depletion  Dorsal Hand  Severe depletion  Patellar Region  Severe depletion  Anterior Thigh Region  Severe depletion  Posterior Calf Region  Severe depletion  Edema (RD Assessment)  None  Hair  Reviewed  Eyes  Reviewed  Mouth  Reviewed  Skin  Reviewed  Nails  Reviewed     Diet Order:   Diet Order            Diet Carb Modified Fluid consistency: Thin; Room service appropriate? Yes  Diet effective now             EDUCATION NEEDS:   Education needs have been addressed  Skin:  Skin Assessment: Reviewed RN Assessment(Bilateral diabetic foot wounds )  Last BM:  1/8  Height:   Ht Readings from Last 1 Encounters:  07/10/18 5\' 2"  (1.575 m)    Weight:   Wt Readings from Last 1 Encounters:  06/13/18 49.6 kg    Ideal Body Weight:  50 kg  BMI:  Body mass index is 20 kg/m.  Estimated Nutritional Needs:   Kcal:  1300-1500kcal/day   Protein:  68-77g/day   Fluid:  >1.3L/day   Betsey Holiday MS, RD, LDN Pager #- [phone] Office#- [phone] After Hours Pager: [phone]

## 2018-07-10 NOTE — Progress Notes (Signed)
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  Allergies  Allergen Reactions  . Eggs Or Egg-Derived Products Diarrhea, Itching and Nausea And Vomiting    Patient Measurements:   Heparin Dosing Weight: 36.6 kg - reported by Jacqulyn Cane RN  Vital Signs: Temp: 98 F (36.7 C) (01/09 0532) Temp Source: Oral (01/09 0532) BP: 95/55 (01/09 0532) Pulse Rate: 89 (01/09 0532)  Labs: Recent Labs    07/09/18 1758 07/09/18 1842 07/09/18 1913 07/10/18 0056  HGB  --  10.0*  --  8.9*  HCT  --  32.8*  --  29.2*  PLT  --  241  --  233  APTT  --   --  39* 84*  LABPROT  --   --  16.2*  --   INR  --   --  1.32  --   HEPARINUNFRC  --   --  1.67*  --   CREATININE 0.51  --   --  1.04*    CrCl cannot be calculated (Unknown ideal weight.).   Medical History: Past Medical History:  Diagnosis Date  . Anxiety   . Colon polyps   . COPD (chronic obstructive pulmonary disease) (HCC)   . Diabetes mellitus without complication (HCC)   . DVT (deep venous thrombosis) (HCC)   . DVT (deep venous thrombosis) (HCC)   . History of colonic polyps   . Hyperlipidemia   . Peripheral artery disease (HCC)   . Pulmonary nodules/lesions, multiple 08/2012   NEGATIVE PET SCAN  . Retroperitoneal abscess (HCC)    history of in 04/2013  . Tobacco abuse     Medications:  Scheduled:  . aspirin  81 mg Oral Daily  . atorvastatin  40 mg Oral q1800  . ferrous sulfate  325 mg Oral BID WC  . furosemide  40 mg Oral Daily  . insulin aspart  0-5 Units Subcutaneous QHS  . insulin aspart  0-9 Units Subcutaneous TID WC  . insulin glargine  10 Units Subcutaneous QHS  . mometasone-formoterol  2 puff Inhalation BID  . pantoprazole  40 mg Oral BID  . polyethylene glycol  17 g Oral Daily  . protein supplement shake  11 oz Oral BID BM  . tamsulosin  0.4 mg Oral Daily  . tiotropium  18 mcg  Inhalation Daily  . ascorbic acid  500 mg Oral BID   Infusions:  . sodium chloride Stopped (07/10/18 0007)  . heparin 550 Units/hr (07/10/18 0300)  . piperacillin-tazobactam (ZOSYN)  IV 3.375 g (07/10/18 0240)  . vancomycin Stopped (07/09/18 2204)   PRN: sodium chloride, acetaminophen **OR** acetaminophen, albuterol, ALPRAZolam, HYDROcodone-acetaminophen, morphine injection, nicotine, ondansetron **OR** ondansetron (ZOFRAN) IV, polyethylene glycol  Assessment: Pharmacy was consulted for heparin.  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 Will follow aPTT.   Goal of Therapy:  APTT level 66-102 seconds or Heparin level 0.3-0.7 units/ml once heparin level correlates with aPTT  Monitor platelets by anticoagulation protocol: Yes   Heparin Course: 1/8 PM initiation: 1800 unit bolus, then 550 units/hr 1/9 0056 aPTT 84s 1/9 0902 aPTT 84s  Plan:  Second confirmatory level is therapeutic Continue to monitor H&H and platelets  Recheck aPTT, heparin level and CBC with tomorrow AM labs.  Lowella Bandy, PharmD 07/10/2018,7:34 AM

## 2018-07-10 NOTE — Progress Notes (Addendum)
[facility] -  at [facility]   PATIENT NAME: April [name]    MR#:  [id number]  DATE OF BIRTH:  10-27-1955  SUBJECTIVE:  CHIEF COMPLAINT: Reports right foot infection is getting worse, pain is okay during my exam and seen by Dr. Alberteen Spindle  yesterday at his office  REVIEW OF SYSTEMS:  CONSTITUTIONAL: No fever, fatigue or weakness.  EYES: No blurred or double vision.  EARS, NOSE, AND THROAT: No tinnitus or ear pain.  RESPIRATORY: No cough, shortness of breath, wheezing or hemoptysis.  CARDIOVASCULAR: No chest pain, orthopnea, edema.  GASTROINTESTINAL: No nausea, vomiting, diarrhea or abdominal pain.  GENITOURINARY: No dysuria, hematuria.  ENDOCRINE: No polyuria, nocturia,  HEMATOLOGY: No anemia, easy bruising or bleeding SKIN: Worsening of right foot infection and has left heel ulcer MUSCULOSKELETAL: No joint pain or arthritis.   NEUROLOGIC: No tingling, numbness, weakness.  PSYCHIATRY: No anxiety or depression.   DRUG ALLERGIES:   Allergies  Allergen Reactions  . Eggs Or Egg-Derived Products Diarrhea, Itching and Nausea And Vomiting    VITALS:  Blood pressure (!) 102/50, pulse 86, temperature (!) 97.5 F (36.4 C), temperature source Oral, resp. rate (!) 22, height 5\' 2"  (1.575 m), SpO2 99 %.  PHYSICAL EXAMINATION:  GENERAL:  63 y.o.-year-old patient lying in the bed with no acute distress.  EYES: Pupils equal, round, reactive to light and accommodation. No scleral icterus. Extraocular muscles intact.  HEENT: Head atraumatic, normocephalic. Oropharynx and nasopharynx clear.  NECK:  Supple, no jugular venous distention. No thyroid enlargement, no tenderness.  LUNGS: Normal breath sounds bilaterally, no wheezing, rales,rhonchi or crepitation. No use of accessory muscles of respiration.  CARDIOVASCULAR: S1, S2 normal. No murmurs, rubs, or gallops.  ABDOMEN: Soft, nontender, nondistended. Bowel sounds present.  EXTREMITIES: No pedal edema, cyanosis,  or clubbing.  NEUROLOGIC: Awake, alert and oriented x3 sensation intact. Gait not checked.  PSYCHIATRIC: The patient is alert and oriented x 3.  SKIN: Right foot with full-thickness ulceration with some necrosis, left heel ulcer with necrosis  LABORATORY PANEL:   CBC Recent Labs  Lab 07/10/18 0056  WBC 7.8  HGB 8.9*  HCT 29.2*  PLT 233   ------------------------------------------------------------------------------------------------------------------  Chemistries  Recent Labs  Lab 07/10/18 0056  NA 134*  K 4.0  CL 98  CO2 27  GLUCOSE 371*  BUN 34*  CREATININE 1.04*  CALCIUM 8.2*   ------------------------------------------------------------------------------------------------------------------  Cardiac Enzymes No results for input(s): TROPONINI in the last 168 hours. ------------------------------------------------------------------------------------------------------------------  RADIOLOGY:  Dg Foot Complete Right  Result Date: 07/10/2018 CLINICAL DATA:  Foot ulcer. EXAM: RIGHT FOOT COMPLETE - 3+ VIEW COMPARISON:  06/03/2018 FINDINGS: Stable postsurgical resection of the distal first metatarsal bone. Again seen is irregularity of the base of the first proximal phalanx possibly due to ongoing osteomyelitis. 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 Cortical irregularity of the remaining metatarsal bone is stable from November 2019. There is a deep penetrating wound in the plantar foot at the level of the first metatarsophalangeal joint. IMPRESSION: Probable surgical resection of the distal first metatarsal bone. Deep penetrating wound at the level of the first metatarsophalangeal joint, with cortical irregularity of the base of the first proximal phalanx, suggestive possible osteomyelitis. Electronically Signed   By: Ted Mcalpine M.D.   On: 07/10/2018 14:27    EKG:   Orders placed or performed during the hospital encounter of 06/03/18  . ED EKG  . ED EKG   . EKG 12-Lead  . EKG 12-Lead  ASSESSMENT AND PLAN:     Bilateral infected diabetic foot wounds- present on the plantar surface of her right foot and the heel of her left foot.  No signs of sepsis on admission. -Podiatry consult has been seen by Dr. Ether Griffins recommending debridement of the right medial foot and likely placement of wound VAC tomorrow.  Patient is agreeable -Can continue with Santyl dressing changes in the near future per podiatry -Vascular surgery consult placed which is pending at this time -Continue empiric antibiotics -Follow-up wound culture -Check CBC -Pain control as needed X-ray of the right foot possible osteomyelitis  Recurrent left lower extremity DVT-on Xarelto at home. -Hold Xarelto for possible surgery -Place on heparin drip  COPD-stable, no signs of acute exacerbation -Continue home inhalers  Uncontrolled type 2 diabetes- recent A1c 10.9. -Continue  Lantus dose increased to 15 units but patient will be n.p.o. after midnight for the procedure tomorrow will give only 7 units tonight for basal coverage and SSI  Hyperlipidemia-stable -Continue home statin  Anxiety-stable -Continue home Xanax  Severe protein-calorie malnutrition -Continue home Premier protein twice daily between meals  Tobacco abuse -Nicotine patch as needed  All the records are reviewed and case discussed with Care Management/Social Workerr. Management plans discussed with the patient, family and they are in agreement.  CODE STATUS: fc  TOTAL TIME TAKING CARE OF THIS PATIENT: 35 minutes.   POSSIBLE D/C IN 2 DAYS, DEPENDING ON CLINICAL CONDITION.  Note: This dictation was prepared with Dragon dictation along with smaller phrase technology. Any transcriptional errors that result from this process are unintentional.   Ramonita Lab M.D on 07/10/2018 at 3:40 PM  Between 7am to 6pm - Pager - [phone] After 6pm go to [email or web address] - password [email or web address]  [facility] [facility]  Office  [phone]  CC: Primary care physician; Mickel Fuchs, MD

## 2018-07-11 ENCOUNTER — Inpatient Hospital Stay: Payer: Medicare HMO | Admitting: Anesthesiology

## 2018-07-11 ENCOUNTER — Encounter
Admission: AD | Disposition: A | Discharge status: Skilled Nursing Facility | Payer: Self-pay | Source: Ambulatory Visit | Attending: Internal Medicine

## 2018-07-11 HISTORY — PX: APPLICATION OF WOUND VAC: SHX5189

## 2018-07-11 HISTORY — PX: IRRIGATION AND DEBRIDEMENT FOOT: SHX6602

## 2018-07-11 LAB — GLUCOSE, CAPILLARY
Glucose-Capillary: 121 mg/dL — ABNORMAL HIGH (ref 70–99)
Glucose-Capillary: 170 mg/dL — ABNORMAL HIGH (ref 70–99)
Glucose-Capillary: 205 mg/dL — ABNORMAL HIGH (ref 70–99)
Glucose-Capillary: 253 mg/dL — ABNORMAL HIGH (ref 70–99)
Glucose-Capillary: 263 mg/dL — ABNORMAL HIGH (ref 70–99)
Glucose-Capillary: 376 mg/dL — ABNORMAL HIGH (ref 70–99)
Glucose-Capillary: 376 mg/dL — ABNORMAL HIGH (ref 70–99)
Glucose-Capillary: 427 mg/dL — ABNORMAL HIGH (ref 70–99)
Glucose-Capillary: 47 mg/dL — ABNORMAL LOW (ref 70–99)
Glucose-Capillary: 48 mg/dL — ABNORMAL LOW (ref 70–99)

## 2018-07-11 LAB — MAGNESIUM: Magnesium: 2.1 mg/dL (ref 1.7–2.4)

## 2018-07-11 LAB — CBC
HEMATOCRIT: 27 % — AB (ref 36.0–46.0)
Hemoglobin: 8.3 g/dL — ABNORMAL LOW (ref 12.0–15.0)
MCH: 28.5 pg (ref 26.0–34.0)
MCHC: 30.7 g/dL (ref 30.0–36.0)
MCV: 92.8 fL (ref 80.0–100.0)
Platelets: 192 10*3/uL (ref 150–400)
RBC: 2.91 MIL/uL — ABNORMAL LOW (ref 3.87–5.11)
RDW: 14.5 % (ref 11.5–15.5)
WBC: 7.4 10*3/uL (ref 4.0–10.5)
nRBC: 0 % (ref 0.0–0.2)

## 2018-07-11 LAB — BASIC METABOLIC PANEL
Anion gap: 8 (ref 5–15)
BUN: 32 mg/dL — ABNORMAL HIGH (ref 8–23)
CO2: 28 mmol/L (ref 22–32)
Calcium: 8.2 mg/dL — ABNORMAL LOW (ref 8.9–10.3)
Chloride: 98 mmol/L (ref 98–111)
Creatinine, Ser: 1.04 mg/dL — ABNORMAL HIGH (ref 0.44–1.00)
GFR calc Af Amer: >60 mL/min (ref >60)
GFR, EST NON AFRICAN AMERICAN: 57 mL/min — AB (ref >60)
Glucose, Bld: 428 mg/dL — ABNORMAL HIGH (ref 70–99)
Potassium: 4.4 mmol/L (ref 3.5–5.1)
Sodium: 134 mmol/L — ABNORMAL LOW (ref 135–145)

## 2018-07-11 LAB — APTT: aPTT: 55 seconds — ABNORMAL HIGH (ref 24–36)

## 2018-07-11 LAB — PROTIME-INR
INR: 1.03
Prothrombin Time: 13.4 seconds (ref 11.4–15.2)

## 2018-07-11 LAB — HEPARIN LEVEL (UNFRACTIONATED): Heparin Unfractionated: 0.44 IU/mL (ref 0.30–0.70)

## 2018-07-11 SURGERY — IRRIGATION AND DEBRIDEMENT FOOT
Anesthesia: General | Site: Foot | Laterality: Right

## 2018-07-11 MED ORDER — LIDOCAINE-EPINEPHRINE 1 %-1:100000 IJ SOLN
INTRAMUSCULAR | Status: AC
  Filled 2018-07-11: qty 1

## 2018-07-11 MED ORDER — HEPARIN (PORCINE) 25000 UT/250ML-% IV SOLN: 650.0000 [IU]/h | INTRAVENOUS | Status: DC

## 2018-07-11 MED ORDER — NICOTINE 14 MG/24HR TD PT24: 14.0000 mg | MEDICATED_PATCH | Freq: Every day | TRANSDERMAL | Status: DC | PRN

## 2018-07-11 MED ORDER — INSULIN ASPART 100 UNIT/ML ~~LOC~~ SOLN
0.0000 [IU] | Freq: Every day | SUBCUTANEOUS | Status: DC
  Administered 2018-07-12: 3 [IU] via SUBCUTANEOUS
  Administered 2018-07-14: 5 [IU] via SUBCUTANEOUS
  Administered 2018-07-15: 2 [IU] via SUBCUTANEOUS
  Administered 2018-07-16: 3 [IU] via SUBCUTANEOUS
  Administered 2018-07-24: 2 [IU] via SUBCUTANEOUS
  Administered 2018-07-25: 5 [IU] via SUBCUTANEOUS
  Filled 2018-07-11 (×7): qty 1

## 2018-07-11 MED ORDER — PROPOFOL 500 MG/50ML IV EMUL
INTRAVENOUS | Status: DC | PRN
  Administered 2018-07-11: 100 ug/kg/min via INTRAVENOUS

## 2018-07-11 MED ORDER — ENSURE MAX PROTEIN PO LIQD
11.0000 [oz_av] | Freq: Two times a day (BID) | ORAL | Status: DC
  Administered 2018-07-12 – 2018-07-25 (×18): 11 [oz_av] via ORAL
  Filled 2018-07-11: qty 330

## 2018-07-11 MED ORDER — MOMETASONE FURO-FORMOTEROL FUM 200-5 MCG/ACT IN AERO
2.0000 | INHALATION_SPRAY | Freq: Two times a day (BID) | RESPIRATORY_TRACT | Status: DC
  Administered 2018-07-11 – 2018-07-26 (×27): 2 via RESPIRATORY_TRACT
  Filled 2018-07-11 (×2): qty 8.8

## 2018-07-11 MED ORDER — ATORVASTATIN CALCIUM 20 MG PO TABS: 40.0000 mg | ORAL_TABLET | Freq: Every day | ORAL | Status: DC

## 2018-07-11 MED ORDER — GUAIFENESIN-DM 100-10 MG/5ML PO SYRP
5.0000 mL | ORAL_SOLUTION | ORAL | Status: DC | PRN
  Filled 2018-07-11: qty 5

## 2018-07-11 MED ORDER — BUPIVACAINE HCL 0.5 % IJ SOLN
INTRAMUSCULAR | Status: DC | PRN
  Administered 2018-07-11: 5 mL

## 2018-07-11 MED ORDER — DEXTROSE 50 % IV SOLN
INTRAVENOUS | Status: AC
  Filled 2018-07-11: qty 50

## 2018-07-11 MED ORDER — OCUVITE-LUTEIN PO CAPS
1.0000 | ORAL_CAPSULE | Freq: Every day | ORAL | Status: DC
  Administered 2018-07-12 – 2018-07-26 (×9): 1 via ORAL
  Filled 2018-07-11 (×16): qty 1

## 2018-07-11 MED ORDER — FUROSEMIDE 40 MG PO TABS
40.0000 mg | ORAL_TABLET | Freq: Every day | ORAL | Status: DC
  Administered 2018-07-12 – 2018-07-26 (×10): 40 mg via ORAL
  Filled 2018-07-11: qty 1
  Filled 2018-07-11: qty 2
  Filled 2018-07-11 (×2): qty 1
  Filled 2018-07-11: qty 2
  Filled 2018-07-11 (×2): qty 1
  Filled 2018-07-11: qty 2
  Filled 2018-07-11 (×3): qty 1

## 2018-07-11 MED ORDER — BUPIVACAINE HCL (PF) 0.25 % IJ SOLN
INTRAMUSCULAR | Status: AC
  Filled 2018-07-11: qty 30

## 2018-07-11 MED ORDER — SODIUM CHLORIDE 0.9 % IV SOLN
INTRAVENOUS | Status: DC | PRN
  Administered 2018-07-17: 15:00:00 via INTRAVENOUS
  Administered 2018-07-17 – 2018-07-25 (×4): 250 mL via INTRAVENOUS

## 2018-07-11 MED ORDER — VANCOMYCIN HCL IN DEXTROSE 1-5 GM/200ML-% IV SOLN
1000.0000 mg | INTRAVENOUS | Status: DC
  Administered 2018-07-11 – 2018-07-13 (×3): 1000 mg via INTRAVENOUS
  Filled 2018-07-11 (×4): qty 200

## 2018-07-11 MED ORDER — DEXMEDETOMIDINE HCL 200 MCG/2ML IV SOLN
INTRAVENOUS | Status: DC | PRN
  Administered 2018-07-11: 4 ug via INTRAVENOUS
  Administered 2018-07-11: 8 ug via INTRAVENOUS

## 2018-07-11 MED ORDER — ATORVASTATIN CALCIUM 20 MG PO TABS
40.0000 mg | ORAL_TABLET | Freq: Every day | ORAL | Status: DC
  Administered 2018-07-12 – 2018-07-26 (×14): 40 mg via ORAL
  Filled 2018-07-11 (×14): qty 2

## 2018-07-11 MED ORDER — INSULIN ASPART 100 UNIT/ML ~~LOC~~ SOLN
3.0000 [IU] | Freq: Three times a day (TID) | SUBCUTANEOUS | Status: DC
  Administered 2018-07-12 – 2018-07-26 (×23): 3 [IU] via SUBCUTANEOUS
  Filled 2018-07-11 (×28): qty 1

## 2018-07-11 MED ORDER — MUPIROCIN 2 % EX OINT
1.0000 "application " | TOPICAL_OINTMENT | Freq: Two times a day (BID) | CUTANEOUS | Status: DC
  Filled 2018-07-11: qty 22

## 2018-07-11 MED ORDER — LIDOCAINE-EPINEPHRINE 1 %-1:100000 IJ SOLN
INTRAMUSCULAR | Status: DC | PRN
  Administered 2018-07-11: 5 mL

## 2018-07-11 MED ORDER — ONDANSETRON HCL 4 MG/2ML IJ SOLN: 4.0000 mg | Freq: Once | INTRAMUSCULAR | Status: DC | PRN

## 2018-07-11 MED ORDER — ALPRAZOLAM 0.5 MG PO TABS
0.5000 mg | ORAL_TABLET | Freq: Two times a day (BID) | ORAL | Status: DC | PRN
  Filled 2018-07-11: qty 1

## 2018-07-11 MED ORDER — PERMETHRIN 5 % EX CREA
1.0000 "application " | TOPICAL_CREAM | Freq: Once | CUTANEOUS | Status: AC
  Administered 2018-07-16: 1 via TOPICAL
  Filled 2018-07-11: qty 60

## 2018-07-11 MED ORDER — PANTOPRAZOLE SODIUM 40 MG PO TBEC
40.0000 mg | DELAYED_RELEASE_TABLET | Freq: Two times a day (BID) | ORAL | Status: DC
  Administered 2018-07-11 – 2018-07-26 (×27): 40 mg via ORAL
  Filled 2018-07-11 (×27): qty 1

## 2018-07-11 MED ORDER — PHENYLEPHRINE HCL 10 MG/ML IJ SOLN
INTRAMUSCULAR | Status: DC | PRN
  Administered 2018-07-11: 50 ug via INTRAVENOUS

## 2018-07-11 MED ORDER — ALBUTEROL SULFATE (2.5 MG/3ML) 0.083% IN NEBU
2.5000 mg | INHALATION_SOLUTION | Freq: Four times a day (QID) | RESPIRATORY_TRACT | Status: DC | PRN
  Administered 2018-07-16 (×2): 2.5 mg via RESPIRATORY_TRACT
  Filled 2018-07-11: qty 3

## 2018-07-11 MED ORDER — DEXTROSE 50 % IV SOLN
12.5000 g | INTRAVENOUS | Status: AC
  Administered 2018-07-11: 12.5 g via INTRAVENOUS

## 2018-07-11 MED ORDER — INSULIN ASPART 100 UNIT/ML ~~LOC~~ SOLN
0.0000 [IU] | Freq: Three times a day (TID) | SUBCUTANEOUS | Status: DC
  Administered 2018-07-11: 15 [IU] via SUBCUTANEOUS

## 2018-07-11 MED ORDER — DEXTROSE 50 % IV SOLN
INTRAVENOUS | Status: AC
  Administered 2018-07-11: 12.5 g via INTRAVENOUS
  Filled 2018-07-11: qty 50

## 2018-07-11 MED ORDER — INSULIN ASPART 100 UNIT/ML ~~LOC~~ SOLN
0.0000 [IU] | Freq: Three times a day (TID) | SUBCUTANEOUS | Status: DC
  Administered 2018-07-11: 15 [IU] via SUBCUTANEOUS
  Administered 2018-07-12: 8 [IU] via SUBCUTANEOUS
  Administered 2018-07-12: 15 [IU] via SUBCUTANEOUS
  Administered 2018-07-12: 3 [IU] via SUBCUTANEOUS
  Administered 2018-07-13: 8 [IU] via SUBCUTANEOUS
  Administered 2018-07-13 (×2): 3 [IU] via SUBCUTANEOUS
  Administered 2018-07-14: 8 [IU] via SUBCUTANEOUS
  Administered 2018-07-14: 1 [IU] via SUBCUTANEOUS
  Administered 2018-07-15: 2 [IU] via SUBCUTANEOUS
  Administered 2018-07-16 – 2018-07-17 (×2): 5 [IU] via SUBCUTANEOUS
  Administered 2018-07-18: 3 [IU] via SUBCUTANEOUS
  Administered 2018-07-18 – 2018-07-19 (×2): 2 [IU] via SUBCUTANEOUS
  Administered 2018-07-19 – 2018-07-20 (×2): 3 [IU] via SUBCUTANEOUS
  Administered 2018-07-21: 2 [IU] via SUBCUTANEOUS
  Administered 2018-07-21 – 2018-07-22 (×2): 3 [IU] via SUBCUTANEOUS
  Administered 2018-07-22: 8 [IU] via SUBCUTANEOUS
  Administered 2018-07-22 – 2018-07-24 (×6): 3 [IU] via SUBCUTANEOUS
  Administered 2018-07-25: 15 [IU] via SUBCUTANEOUS
  Administered 2018-07-25: 8 [IU] via SUBCUTANEOUS
  Administered 2018-07-25: 11 [IU] via SUBCUTANEOUS
  Administered 2018-07-26: 5 [IU] via SUBCUTANEOUS
  Administered 2018-07-26 (×2): 15 [IU] via SUBCUTANEOUS
  Filled 2018-07-11 (×33): qty 1

## 2018-07-11 MED ORDER — PROPOFOL 10 MG/ML IV BOLUS
INTRAVENOUS | Status: DC | PRN
  Administered 2018-07-11: 10 mg via INTRAVENOUS

## 2018-07-11 MED ORDER — POLYETHYLENE GLYCOL 3350 17 G PO PACK: 17.0000 g | PACK | Freq: Every day | ORAL | Status: DC | PRN

## 2018-07-11 MED ORDER — POLYETHYLENE GLYCOL 3350 17 G PO PACK
17.0000 g | PACK | Freq: Every day | ORAL | Status: DC
  Administered 2018-07-19 – 2018-07-26 (×6): 17 g via ORAL
  Filled 2018-07-11 (×8): qty 1

## 2018-07-11 MED ORDER — TIOTROPIUM BROMIDE MONOHYDRATE 18 MCG IN CAPS: 18.0000 ug | ORAL_CAPSULE | Freq: Every day | RESPIRATORY_TRACT | Status: DC

## 2018-07-11 MED ORDER — VITAMIN C 500 MG PO TABS
500.0000 mg | ORAL_TABLET | Freq: Two times a day (BID) | ORAL | Status: DC
  Administered 2018-07-11 – 2018-07-26 (×25): 500 mg via ORAL
  Filled 2018-07-11 (×29): qty 1

## 2018-07-11 MED ORDER — FERROUS SULFATE 325 (65 FE) MG PO TABS
325.0000 mg | ORAL_TABLET | Freq: Two times a day (BID) | ORAL | Status: DC
  Administered 2018-07-12 – 2018-07-26 (×23): 325 mg via ORAL
  Filled 2018-07-11 (×23): qty 1

## 2018-07-11 MED ORDER — ALBUTEROL SULFATE HFA 108 (90 BASE) MCG/ACT IN AERS: 2.0000 | INHALATION_SPRAY | Freq: Four times a day (QID) | RESPIRATORY_TRACT | Status: DC | PRN

## 2018-07-11 MED ORDER — FENTANYL CITRATE (PF) 100 MCG/2ML IJ SOLN: 25.0000 ug | INTRAMUSCULAR | Status: DC | PRN

## 2018-07-11 MED ORDER — PIPERACILLIN-TAZOBACTAM 3.375 G IVPB
3.3750 g | Freq: Three times a day (TID) | INTRAVENOUS | Status: DC
  Administered 2018-07-11 – 2018-07-13 (×7): 3.375 g via INTRAVENOUS
  Filled 2018-07-11 (×7): qty 50

## 2018-07-11 MED ORDER — SODIUM CHLORIDE 0.9 % IV SOLN
INTRAVENOUS | Status: DC
  Administered 2018-07-14 (×2): via INTRAVENOUS

## 2018-07-11 MED ORDER — FENTANYL CITRATE (PF) 100 MCG/2ML IJ SOLN
INTRAMUSCULAR | Status: DC | PRN
  Administered 2018-07-11: 25 ug via INTRAVENOUS

## 2018-07-11 MED ORDER — ACETAMINOPHEN 325 MG PO TABS
650.0000 mg | ORAL_TABLET | Freq: Four times a day (QID) | ORAL | Status: DC | PRN
  Administered 2018-07-16: 650 mg via ORAL
  Filled 2018-07-11: qty 2

## 2018-07-11 MED ORDER — MORPHINE SULFATE (PF) 4 MG/ML IV SOLN
4.0000 mg | INTRAVENOUS | Status: DC | PRN
  Administered 2018-07-11 – 2018-07-18 (×12): 4 mg via INTRAVENOUS
  Filled 2018-07-11 (×15): qty 1

## 2018-07-11 MED ORDER — TAMSULOSIN HCL 0.4 MG PO CAPS
0.4000 mg | ORAL_CAPSULE | Freq: Every day | ORAL | Status: DC
  Administered 2018-07-12 – 2018-07-26 (×11): 0.4 mg via ORAL
  Filled 2018-07-11 (×11): qty 1

## 2018-07-11 MED ORDER — CHLORHEXIDINE GLUCONATE CLOTH 2 % EX PADS
6.0000 | MEDICATED_PAD | Freq: Every day | CUTANEOUS | Status: DC
  Administered 2018-07-11: 6 via TOPICAL

## 2018-07-11 MED ORDER — FENTANYL CITRATE (PF) 100 MCG/2ML IJ SOLN
INTRAMUSCULAR | Status: AC
  Filled 2018-07-11: qty 2

## 2018-07-11 MED ORDER — HEPARIN (PORCINE) 25000 UT/250ML-% IV SOLN
1050.0000 [IU]/h | INTRAVENOUS | Status: DC
  Administered 2018-07-12: 650 [IU]/h via INTRAVENOUS
  Administered 2018-07-13: 750 [IU]/h via INTRAVENOUS
  Filled 2018-07-11 (×3): qty 250

## 2018-07-11 MED ORDER — FUROSEMIDE 40 MG PO TABS: 40.0000 mg | ORAL_TABLET | Freq: Every day | ORAL | Status: DC

## 2018-07-11 MED ORDER — ASPIRIN 81 MG PO CHEW: 81.0000 mg | CHEWABLE_TABLET | Freq: Every day | ORAL | Status: DC

## 2018-07-11 MED ORDER — HYDROCODONE-ACETAMINOPHEN 5-325 MG PO TABS
1.0000 | ORAL_TABLET | Freq: Four times a day (QID) | ORAL | Status: DC | PRN
  Administered 2018-07-11 – 2018-07-14 (×8): 2 via ORAL
  Administered 2018-07-14 (×4): 1 via ORAL
  Administered 2018-07-15: 2 via ORAL
  Administered 2018-07-16: 1 via ORAL
  Administered 2018-07-16 (×2): 2 via ORAL
  Administered 2018-07-17: 1 via ORAL
  Administered 2018-07-17: 2 via ORAL
  Administered 2018-07-18 – 2018-07-20 (×5): 1 via ORAL
  Administered 2018-07-20 – 2018-07-24 (×8): 2 via ORAL
  Filled 2018-07-11: qty 1
  Filled 2018-07-11 (×3): qty 2
  Filled 2018-07-11: qty 1
  Filled 2018-07-11 (×3): qty 2
  Filled 2018-07-11 (×2): qty 1
  Filled 2018-07-11 (×4): qty 2
  Filled 2018-07-11: qty 1
  Filled 2018-07-11 (×2): qty 2
  Filled 2018-07-11: qty 1
  Filled 2018-07-11: qty 2
  Filled 2018-07-11: qty 1
  Filled 2018-07-11: qty 2
  Filled 2018-07-11: qty 1
  Filled 2018-07-11 (×3): qty 2
  Filled 2018-07-11: qty 1
  Filled 2018-07-11 (×4): qty 2
  Filled 2018-07-11: qty 1
  Filled 2018-07-11 (×2): qty 2

## 2018-07-11 MED ORDER — INSULIN GLARGINE 100 UNIT/ML ~~LOC~~ SOLN
12.0000 [IU] | Freq: Every day | SUBCUTANEOUS | Status: DC
  Filled 2018-07-11 (×2): qty 0.12

## 2018-07-11 MED ORDER — ASPIRIN 81 MG PO CHEW
81.0000 mg | CHEWABLE_TABLET | Freq: Every day | ORAL | Status: DC
  Administered 2018-07-12 – 2018-07-17 (×4): 81 mg via ORAL
  Filled 2018-07-11 (×4): qty 1

## 2018-07-11 MED ORDER — LIDOCAINE HCL (PF) 1 % IJ SOLN
INTRAMUSCULAR | Status: AC
  Filled 2018-07-11: qty 30

## 2018-07-11 MED ORDER — INSULIN GLARGINE 100 UNIT/ML ~~LOC~~ SOLN
8.0000 [IU] | Freq: Every day | SUBCUTANEOUS | Status: DC
  Filled 2018-07-11: qty 0.08

## 2018-07-11 MED ORDER — TIOTROPIUM BROMIDE MONOHYDRATE 18 MCG IN CAPS
18.0000 ug | ORAL_CAPSULE | Freq: Every day | RESPIRATORY_TRACT | Status: DC
  Administered 2018-07-12 – 2018-07-26 (×10): 18 ug via RESPIRATORY_TRACT
  Filled 2018-07-11 (×3): qty 5

## 2018-07-11 MED ORDER — DEXMEDETOMIDINE HCL IN NACL 200 MCG/50ML IV SOLN
INTRAVENOUS | Status: AC
  Filled 2018-07-11: qty 50

## 2018-07-11 MED ORDER — HYDROCODONE-ACETAMINOPHEN 5-325 MG PO TABS: 1.0000 | ORAL_TABLET | Freq: Four times a day (QID) | ORAL | Status: DC | PRN

## 2018-07-11 MED ORDER — BUPIVACAINE HCL (PF) 0.5 % IJ SOLN
INTRAMUSCULAR | Status: AC
  Filled 2018-07-11: qty 30

## 2018-07-11 MED ORDER — ONDANSETRON HCL 4 MG/2ML IJ SOLN
4.0000 mg | Freq: Four times a day (QID) | INTRAMUSCULAR | Status: DC | PRN
  Administered 2018-07-16 – 2018-07-18 (×3): 4 mg via INTRAVENOUS
  Filled 2018-07-11 (×2): qty 2

## 2018-07-11 SURGICAL SUPPLY — 69 items
BANDAGE ACE 4X5 VEL STRL LF (GAUZE/BANDAGES/DRESSINGS) ×1 IMPLANT
BLADE OSC/SAGITTAL MD 5.5X18 (BLADE) IMPLANT
BLADE OSCILLATING/SAGITTAL (BLADE) ×1
BLADE SURG 15 STRL LF DISP TIS (BLADE) ×1 IMPLANT
BLADE SURG 15 STRL SS (BLADE) ×1
BLADE SW THK.38XMED LNG THN (BLADE) IMPLANT
BNDG COHESIVE 4X5 TAN STRL (GAUZE/BANDAGES/DRESSINGS) ×1 IMPLANT
BNDG COHESIVE 6X5 TAN STRL LF (GAUZE/BANDAGES/DRESSINGS) ×1 IMPLANT
BNDG CONFORM 2 STRL LF (GAUZE/BANDAGES/DRESSINGS) ×1 IMPLANT
BNDG CONFORM 3 STRL LF (GAUZE/BANDAGES/DRESSINGS) ×1 IMPLANT
BNDG ESMARK 4X12 TAN STRL LF (GAUZE/BANDAGES/DRESSINGS) ×2 IMPLANT
BNDG GAUZE 4.5X4.1 6PLY STRL (MISCELLANEOUS) ×2 IMPLANT
CANISTER SUCT 1200ML W/VALVE (MISCELLANEOUS) ×2 IMPLANT
CANISTER SUCT 3000ML PPV (MISCELLANEOUS) ×2 IMPLANT
COVER WAND RF STERILE (DRAPES) ×2 IMPLANT
CUFF TOURN 18 STER (MISCELLANEOUS) ×1 IMPLANT
CUFF TOURN DUAL PL 12 NO SLV (MISCELLANEOUS) ×1 IMPLANT
DRAPE FLUOR MINI C-ARM 54X84 (DRAPES) ×1 IMPLANT
DRAPE XRAY CASSETTE 23X24 (DRAPES) IMPLANT
DRESSING ALLEVYN 4X4 (MISCELLANEOUS) IMPLANT
DURAPREP 26ML APPLICATOR (WOUND CARE) ×2 IMPLANT
ELECT REM PT RETURN 9FT ADLT (ELECTROSURGICAL) ×2
ELECTRODE REM PT RTRN 9FT ADLT (ELECTROSURGICAL) ×1 IMPLANT
GAUZE 4X4 16PLY RFD (DISPOSABLE) ×1 IMPLANT
GAUZE PACKING 1/4 X5 YD (GAUZE/BANDAGES/DRESSINGS) ×1 IMPLANT
GAUZE PACKING IODOFORM 1X5 (MISCELLANEOUS) ×1 IMPLANT
GAUZE PETRO XEROFOAM 1X8 (MISCELLANEOUS) ×1 IMPLANT
GAUZE SPONGE 4X4 12PLY STRL (GAUZE/BANDAGES/DRESSINGS) ×1 IMPLANT
GLOVE BIO SURGEON STRL SZ7.5 (GLOVE) ×1 IMPLANT
GLOVE INDICATOR 8.0 STRL GRN (GLOVE) ×2 IMPLANT
GOWN STRL REUS W/ TWL LRG LVL3 (GOWN DISPOSABLE) ×2 IMPLANT
GOWN STRL REUS W/TWL LRG LVL3 (GOWN DISPOSABLE) ×2
GOWN STRL REUS W/TWL MED LVL3 (GOWN DISPOSABLE) ×4 IMPLANT
HANDPIECE VERSAJET DEBRIDEMENT (MISCELLANEOUS) ×1 IMPLANT
IV NS 1000ML (IV SOLUTION) ×1
IV NS 1000ML BAXH (IV SOLUTION) ×1 IMPLANT
KIT DRSG VAC SLVR GRANUFM (MISCELLANEOUS) ×2 IMPLANT
KIT TURNOVER KIT A (KITS) ×2 IMPLANT
LABEL OR SOLS (LABEL) ×2 IMPLANT
NDL FILTER BLUNT 18X1 1/2 (NEEDLE) ×1 IMPLANT
NDL HYPO 25X1 1.5 SAFETY (NEEDLE) ×1 IMPLANT
NEEDLE FILTER BLUNT 18X 1/2SAF (NEEDLE) ×1
NEEDLE FILTER BLUNT 18X1 1/2 (NEEDLE) ×1 IMPLANT
NEEDLE HYPO 25X1 1.5 SAFETY (NEEDLE) ×2 IMPLANT
NS IRRIG 500ML POUR BTL (IV SOLUTION) ×2 IMPLANT
PACK EXTREMITY ARMC (MISCELLANEOUS) ×2 IMPLANT
PAD ABD DERMACEA PRESS 5X9 (GAUZE/BANDAGES/DRESSINGS) ×2 IMPLANT
PENCIL ELECTRO HAND CTR (MISCELLANEOUS) ×2 IMPLANT
PULSAVAC PLUS IRRIG FAN TIP (DISPOSABLE)
RASP SM TEAR CROSS CUT (RASP) IMPLANT
SHIELD FULL FACE ANTIFOG 7M (MISCELLANEOUS) ×2 IMPLANT
SOL .9 NS 3000ML IRR  AL (IV SOLUTION) ×1
SOL .9 NS 3000ML IRR UROMATIC (IV SOLUTION) ×1 IMPLANT
SOL PREP PVP 2OZ (MISCELLANEOUS)
SOLUTION PREP PVP 2OZ (MISCELLANEOUS) ×1 IMPLANT
STOCKINETTE IMPERVIOUS 9X36 MD (GAUZE/BANDAGES/DRESSINGS) ×2 IMPLANT
SUT ETHILON 2 0 FS 18 (SUTURE) ×2 IMPLANT
SUT ETHILON 4-0 (SUTURE)
SUT ETHILON 4-0 FS2 18XMFL BLK (SUTURE)
SUT VIC AB 3-0 SH 27 (SUTURE) ×1
SUT VIC AB 3-0 SH 27X BRD (SUTURE) ×1 IMPLANT
SUT VIC AB 4-0 FS2 27 (SUTURE) ×2 IMPLANT
SUTURE ETHLN 4-0 FS2 18XMF BLK (SUTURE) ×1 IMPLANT
SWAB CULTURE AMIES ANAERIB BLU (MISCELLANEOUS) ×1 IMPLANT
SYR 10ML LL (SYRINGE) ×4 IMPLANT
SYR 3ML LL SCALE MARK (SYRINGE) ×2 IMPLANT
TIP FAN IRRIG PULSAVAC PLUS (DISPOSABLE) ×1 IMPLANT
TRAY PREP VAG/GEN (MISCELLANEOUS) ×2 IMPLANT
WND VAC CANISTER 500ML (MISCELLANEOUS) ×1 IMPLANT

## 2018-07-11 NOTE — Anesthesia Post-op Follow-up Note (Signed)
Anesthesia QCDR form completed.        

## 2018-07-11 NOTE — Progress Notes (Signed)
[facility] - [place] at [facility]   PATIENT NAME: April [name]    MR#:  [id number]  DATE OF BIRTH:  [birth year]  SUBJECTIVE:  CHIEF COMPLAINT: Reports right foot infection is getting worse, scheduled for IND of the foot and right leg angiogram today  REVIEW OF SYSTEMS:  CONSTITUTIONAL: No fever, fatigue or weakness.  EYES: No blurred or double vision.  EARS, NOSE, AND THROAT: No tinnitus or ear pain.  RESPIRATORY: No cough, shortness of breath, wheezing or hemoptysis.  CARDIOVASCULAR: No chest pain, orthopnea, edema.  GASTROINTESTINAL: No nausea, vomiting, diarrhea or abdominal pain.  GENITOURINARY: No dysuria, hematuria.  ENDOCRINE: No polyuria, nocturia,  HEMATOLOGY: No anemia, easy bruising or bleeding SKIN: Worsening of right foot infection and has left heel ulcer MUSCULOSKELETAL: No joint pain or arthritis.   NEUROLOGIC: No tingling, numbness, weakness.  PSYCHIATRY: No anxiety or depression.   DRUG ALLERGIES:   Allergies  Allergen Reactions  . Eggs Or Egg-Derived Products Diarrhea, Itching and Nausea And Vomiting    VITALS:  Blood pressure (!) 102/52, pulse 97, temperature 98.6 F (37 C), temperature source Oral, resp. rate 20, height 5\' 2"  (1.575 m), SpO2 99 %.  PHYSICAL EXAMINATION:  GENERAL:  63 y.o.-year-old patient lying in the bed with no acute distress.  EYES: Pupils equal, round, reactive to light and accommodation. No scleral icterus. Extraocular muscles intact.  HEENT: Head atraumatic, normocephalic. Oropharynx and nasopharynx clear.  NECK:  Supple, no jugular venous distention. No thyroid enlargement, no tenderness.  LUNGS: Normal breath sounds bilaterally, no wheezing, rales,rhonchi or crepitation. No use of accessory muscles of respiration.  CARDIOVASCULAR: S1, S2 normal. No murmurs, rubs, or gallops.  ABDOMEN: Soft, nontender, nondistended. Bowel sounds present.  EXTREMITIES: No pedal edema, cyanosis, or clubbing.   NEUROLOGIC: Awake, alert and oriented x3 sensation intact. Gait not checked.  PSYCHIATRIC: The patient is alert and oriented x 3.  SKIN: Right foot with full-thickness ulceration with some necrosis, left heel ulcer with necrosis  LABORATORY PANEL:   CBC Recent Labs  Lab 07/11/18 0547  WBC 7.4  HGB 8.3*  HCT 27.0*  PLT 192   ------------------------------------------------------------------------------------------------------------------  Chemistries  Recent Labs  Lab 07/11/18 0547  NA 134*  K 4.4  CL 98  CO2 28  GLUCOSE 428*  BUN 32*  CREATININE 1.04*  CALCIUM 8.2*  MG 2.1   ------------------------------------------------------------------------------------------------------------------  Cardiac Enzymes No results for input(s): TROPONINI in the last 168 hours. ------------------------------------------------------------------------------------------------------------------  RADIOLOGY:  Dg Foot Complete Right  Result Date: 07/10/2018 CLINICAL DATA:  Foot ulcer. EXAM: RIGHT FOOT COMPLETE - 3+ VIEW COMPARISON:  06/03/2018 FINDINGS: Stable postsurgical resection of the distal first metatarsal bone. Again seen is irregularity of the base of the first proximal phalanx possibly due to ongoing osteomyelitis. 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 Cortical irregularity of the remaining metatarsal bone is stable from November 2019. There is a deep penetrating wound in the plantar foot at the level of the first metatarsophalangeal joint. IMPRESSION: Probable surgical resection of the distal first metatarsal bone. Deep penetrating wound at the level of the first metatarsophalangeal joint, with cortical irregularity of the base of the first proximal phalanx, suggestive possible osteomyelitis. Electronically Signed   By: Ted Mcalpine M.D.   On: 07/10/2018 14:27    EKG:   Orders placed or performed during the hospital encounter of 06/03/18  . ED EKG  . ED EKG  .  EKG 12-Lead  . EKG 12-Lead    ASSESSMENT AND PLAN:  Bilateral infected diabetic foot wounds- his right foot osteomyelitis  Wound present on the plantar surface of her right foot and the heel of her left foot.  No signs of sepsis on admission. -Podiatry consult has been seen by Dr. Ether Griffins, patient had debridement of the right foot and excision of the first distal right metatarsal  and likely placement of wound VAC tomorrow.  Patient is agreeable -Can continue with Santyl dressing changes in the near future per podiatry -Vascular surgery consult placed which is pending at this time -Continue empiric antibiotics -Follow-up wound culture -Check CBC -Pain control as needed X-ray of the right foot possible osteomyelitis  Peripheral vascular disease Seen by vascular surgery right lower extremity angiogram Monday  and left leg angiogram tuesday  Recurrent left lower extremity DVT-on Xarelto at home. -Hold Xarelto for possible surgery  on heparin drip  COPD-stable, no signs of acute exacerbation -Continue home inhalers  Uncontrolled type 2 diabetes- recent A1c 10.9. -Continue  Lantus dose increased to 15 units but patient will be n.p.o. after midnight for the procedure tomorrow will give only 7 units tonight for basal coverage and SSI  Hyperlipidemia-stable -Continue home statin  Anxiety-stable -Continue home Xanax  Severe protein-calorie malnutrition -Continue home Premier protein twice daily between meals  Tobacco abuse -Nicotine patch as needed  All the records are reviewed and case discussed with Care Management/Social Workerr. Management plans discussed with the patient, she is  in agreement.  CODE STATUS: fc  TOTAL TIME TAKING CARE OF THIS PATIENT: 35 minutes.   POSSIBLE D/C IN 2 DAYS, DEPENDING ON CLINICAL CONDITION.  Note: This dictation was prepared with Dragon dictation along with smaller phrase technology. Any transcriptional errors that result from  this process are unintentional.   Ramonita Lab M.D on 07/11/2018 at 5:06 PM  Between 7am to 6pm - Pager - [phone] After 6pm go to [email or web address] - password [email or web address]  [facility]  Office  [phone]  CC: Primary care physician; Mickel Fuchs, MD

## 2018-07-11 NOTE — Op Note (Signed)
Operative note   Surgeon:Elliet Goodnow Armed forces logistics/support/administrative officer: None    Preop diagnosis: Right foot full-thickness ulceration. 2.  Osteomyelitis distal first metatarsal right foot    Postop diagnosis: Same    Procedure: 1.Excisional debridement to muscle right foot ulceration 2.  Excision distal first metatarsal right foot 3.  Placement of wound VAC right foot    EBL: Minimal    Anesthesia:local and IV sedation.  Local consisted of a one-to-one mixture of 1% lidocaine with epinephrine and 0.5% bupivacaine    Hemostasis: Epinephrine infiltrated along the incision site    Specimen: Bone for pathology and deep wound culture    Complications: None    Operative indications:April [name] is an 63 y.o. that presents today for surgical intervention.  The risks/benefits/alternatives/complications have been discussed and consent has been given.    Procedure:  Patient was brought into the OR and placed on the operating table in thesupine position. After anesthesia was obtained theright lower extremity was prepped and draped in usual sterile fashion.  Attention was directed to the medial aspect of the right foot where the previous noted ulceration was noted.  This had dehisced and was down to infected bone to the distal first metatarsal.  There was noted to be surrounding necrotic tissue as well.  Excisional debridement of the ulceration taken down to the muscle was initially performed with a versa jet.  This was a full-thickness debridement of the entire medial arch region to the plantar aspect of the first metatarsal head region.  The ulceration measures approximately 5 x 3 cm.  Next evaluation of the distal fifth metatarsal was noted where previous excision of the metatarsal head had been performed.  Grossly the distal first metatarsal looked to be abnormal and likely with osteomyelitis.  At this time the distal first metatarsal was then excised to healthy bone.  This was sent for pathological  examination.  The proximal margin was inked.  Continued irrigation of the right medial arch and foot was then performed.  At this time compression was applied for bleeding.  Finally a silver impregnated wound VAC dressing was placed with the wound VAC placed on the right foot.  Good seal was noted.    Patient tolerated the procedure and anesthesia well.  Was transported from the OR to the PACU with all vital signs stable and vascular status intact. To be discharged per routine protocol.  Will follow up in approximately 1 week in the outpatient clinic.

## 2018-07-11 NOTE — Progress Notes (Addendum)
Inpatient Diabetes Program Recommendations  AACE/ADA: New Consensus Statement on Inpatient Glycemic Control  Target Ranges:  Prepandial:   less than 140 mg/dL      Peak postprandial:   less than 180 mg/dL (1-2 hours)      Critically ill patients:  140 - 180 mg/dL  Results for April [name], April [name] (MRN [id number]) as of 07/11/2018 14:11  Ref. Range 07/11/2018 08:04 07/11/2018 09:19 07/11/2018 11:23 07/11/2018 13:25 07/11/2018 13:30 07/11/2018 13:56  Glucose-Capillary Latest Ref Range: 70 - 99 mg/dL 341 (H)  Novolog 18 units @8 :43 376 (H) 121 (H) 48 (L) 47 (L) 170 (H)   Results for [name], [name] (MRN [id number]) as of 07/11/2018 11:08  Ref. Range 07/10/2018 07:47 07/10/2018 12:08 07/10/2018 17:01 07/10/2018 22:58  Glucose-Capillary Latest Ref Range: 70 - 99 mg/dL 921 (H) 194 (H) 174 (H) 263 (H)   Review of Glycemic Control  Diabetes history: DM2 Outpatient Diabetes medications: Lantus 10 units daily, Metformin 500 mg BID Current orders for Inpatient glycemic control: Lantus 8 units QHS, Novolog 0-15 units TID with meals, Novolog 0-5 units QHS, Novolog 3 units TID with meals for meal coverage  Inpatient Diabetes Program Recommendations:   Insulin - Basal: Noted Lantus was decreased last night due to NPO status this morning for procedure. Fasting glucose 376 mg/dl. Please consider increasing Lantus to 12 units QHS (based on 49.6 kg x 0.25 units).   Insulin - Meal Coverage: Noted patient received Novolog meal coverage this morning despite being NPO.  Added hold parameters to Novolog 3 units TID with meals (not to be given if premeal glucose less than 80 mg/dl, if NPO, or if eating less than 50% of meal).  Thanks, Orlando Penner, RN, MSN, CDE Diabetes Coordinator Inpatient Diabetes Program [phone] (Team Pager from 8am to 5pm)

## 2018-07-11 NOTE — Anesthesia Postprocedure Evaluation (Signed)
Anesthesia Post Note  Patient: April [name]  Procedure(s) Performed: FOOT DEBRIDEMENT (Right Foot) APPLICATION OF WOUND VAC (Right Foot)  Patient location during evaluation: PACU Anesthesia Type: General Level of consciousness: awake and alert and oriented Pain management: pain level controlled Vital Signs Assessment: post-procedure vital signs reviewed and stable Respiratory status: spontaneous breathing, nonlabored ventilation and respiratory function stable Cardiovascular status: blood pressure returned to baseline and stable Postop Assessment: no signs of nausea or vomiting Anesthetic complications: no     Last Vitals:  Vitals:   07/11/18 1124 07/11/18 1315  BP: 108/63 (!) 96/44  Pulse: 99 86  Resp: 18 15  Temp: 36.6 C   SpO2: 99% 100%    Last Pain:  Vitals:   07/11/18 1315  TempSrc:   PainSc: 0-No pain                 Jaskirat Schwieger

## 2018-07-11 NOTE — Progress Notes (Signed)
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  Allergies  Allergen Reactions  . Eggs Or Egg-Derived Products Diarrhea, Itching and Nausea And Vomiting    Patient Measurements: Height: 5\' 2"  (157.5 cm) IBW/kg (Calculated) : 50.1 Heparin Dosing Weight: 36.6 kg - reported by Jacqulyn Cane RN  Vital Signs: Temp: 97.7 F (36.5 C) (01/10 0655) Temp Source: Oral (01/10 0655) BP: 115/60 (01/10 0655) Pulse Rate: 87 (01/10 0655)  Labs: Recent Labs    07/09/18 1758  07/09/18 1842  07/09/18 1913 07/10/18 0056 07/10/18 0902 07/11/18 0547  HGB  --    < > 10.0*  --   --  8.9*  --  8.3*  HCT  --   --  32.8*  --   --  29.2*  --  27.0*  PLT  --   --  241  --   --  233  --  192  APTT  --   --   --    < > 39* 84* 84* 55*  LABPROT  --   --   --   --  16.2*  --   --  13.4  INR  --   --   --   --  1.32  --   --  1.03  HEPARINUNFRC  --   --   --   --  1.67*  --   --  0.44  CREATININE 0.51  --   --   --   --  1.04*  --  1.04*   < > = values in this interval not displayed.    CrCl cannot be calculated (Unknown ideal weight.).   Medical History: Past Medical History:  Diagnosis Date  . Anxiety   . Colon polyps   . COPD (chronic obstructive pulmonary disease) (HCC)   . Diabetes mellitus without complication (HCC)   . DVT (deep venous thrombosis) (HCC)   . DVT (deep venous thrombosis) (HCC)   . History of colonic polyps   . Hyperlipidemia   . Peripheral artery disease (HCC)   . Pulmonary nodules/lesions, multiple 08/2012   NEGATIVE PET SCAN  . Retroperitoneal abscess (HCC)    history of in 04/2013  . Tobacco abuse     Medications:  Scheduled:  . aspirin  81 mg Oral Daily  . atorvastatin  40 mg Oral q1800  . ferrous sulfate  325 mg Oral BID WC  . furosemide  40 mg Oral Daily  . insulin aspart  0-5 Units Subcutaneous QHS  . insulin aspart  0-9 Units Subcutaneous TID WC  .  insulin aspart  3 Units Subcutaneous TID WC  . insulin glargine  8 Units Subcutaneous QHS  . mometasone-formoterol  2 puff Inhalation BID  . multivitamin-lutein  1 capsule Oral Daily  . pantoprazole  40 mg Oral BID  . polyethylene glycol  17 g Oral Daily  . ENSURE MAX PROTEIN  11 oz Oral BID  . tamsulosin  0.4 mg Oral Daily  . tiotropium  18 mcg Inhalation Daily  . ascorbic acid  500 mg Oral BID   Infusions:  . sodium chloride Stopped (07/10/18 0007)  . sodium chloride 75 mL/hr at 07/11/18 0300  . heparin 550 Units/hr (07/11/18 0300)  . piperacillin-tazobactam (ZOSYN)  IV 3.375 g (07/11/18 0545)  . vancomycin Stopped (07/10/18 1838)   PRN: sodium chloride, acetaminophen **OR** acetaminophen, albuterol, ALPRAZolam, HYDROcodone-acetaminophen, morphine injection, nicotine, ondansetron **OR** ondansetron (  ZOFRAN) IV, polyethylene glycol  Assessment: Pharmacy was consulted for heparin.  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 Will follow anti-Xa now that aPTT and HL correlate  Goal of Therapy:  Heparin level 0.3-0.7 units/ml once heparin level correlates with aPTT  Monitor platelets by anticoagulation protocol: Yes   Heparin Course: 01/08 PM initiation: 1800 unit bolus, then 550 units/hr 01/09 0056 aPTT 84s 01/09 0902 aPTT 84s 01/10 0547 aPTT 55s, HL 0.44: inc to 650 units/hr 01/11 1326 aPTT 53s, HL 0.39: inc rate to 750 units/hr 01/11 1945 aPTT/HL therapeutic x 1 01/12 0159 HL 0.39 01/13 0506 HL 0.24 600 unit bolus, then inc rate to 850 units/hr 01/13 1432 HL 0.34  Plan:  Maintain infusion at 850 units/hr Continue to monitor H&H and platelets  Recheck heparin level in 6 hours and CBC with tomorrow AM labs.  Lowella Bandy, PharmD 07/11/2018,7:55 AM

## 2018-07-11 NOTE — Progress Notes (Signed)
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  Allergies  Allergen Reactions  . Eggs Or Egg-Derived Products Diarrhea, Itching and Nausea And Vomiting    Patient Measurements: Height: 5\' 2"  (157.5 cm) IBW/kg (Calculated) : 50.1 Heparin Dosing Weight: 36.6 kg - reported by Jacqulyn Cane RN  Vital Signs: Temp: 98.1 F (36.7 C) (01/09 2301) Temp Source: Oral (01/09 2301) BP: 111/61 (01/09 2301) Pulse Rate: 100 (01/09 2301)  Labs: Recent Labs    07/09/18 1758  07/09/18 1842  07/09/18 1913 07/10/18 0056 07/10/18 0902 07/11/18 0547  HGB  --    < > 10.0*  --   --  8.9*  --  8.3*  HCT  --   --  32.8*  --   --  29.2*  --  27.0*  PLT  --   --  241  --   --  233  --  192  APTT  --   --   --    < > 39* 84* 84* 55*  LABPROT  --   --   --   --  16.2*  --   --  13.4  INR  --   --   --   --  1.32  --   --  1.03  HEPARINUNFRC  --   --   --   --  1.67*  --   --  0.44  CREATININE 0.51  --   --   --   --  1.04*  --  1.04*   < > = values in this interval not displayed.    CrCl cannot be calculated (Unknown ideal weight.).   Medical History: Past Medical History:  Diagnosis Date  . Anxiety   . Colon polyps   . COPD (chronic obstructive pulmonary disease) (HCC)   . Diabetes mellitus without complication (HCC)   . DVT (deep venous thrombosis) (HCC)   . DVT (deep venous thrombosis) (HCC)   . History of colonic polyps   . Hyperlipidemia   . Peripheral artery disease (HCC)   . Pulmonary nodules/lesions, multiple 08/2012   NEGATIVE PET SCAN  . Retroperitoneal abscess (HCC)    history of in 04/2013  . Tobacco abuse     Medications:  Scheduled:  . aspirin  81 mg Oral Daily  . atorvastatin  40 mg Oral q1800  . ferrous sulfate  325 mg Oral BID WC  . furosemide  40 mg Oral Daily  . insulin aspart  0-5 Units Subcutaneous QHS  . insulin aspart  0-9 Units  Subcutaneous TID WC  . insulin aspart  3 Units Subcutaneous TID WC  . insulin glargine  8 Units Subcutaneous QHS  . mometasone-formoterol  2 puff Inhalation BID  . multivitamin-lutein  1 capsule Oral Daily  . pantoprazole  40 mg Oral BID  . polyethylene glycol  17 g Oral Daily  . ENSURE MAX PROTEIN  11 oz Oral BID  . tamsulosin  0.4 mg Oral Daily  . tiotropium  18 mcg Inhalation Daily  . ascorbic acid  500 mg Oral BID   Infusions:  . sodium chloride Stopped (07/10/18 0007)  . sodium chloride 75 mL/hr at 07/11/18 0300  . heparin 550 Units/hr (07/11/18 0300)  . piperacillin-tazobactam (ZOSYN)  IV 3.375 g (07/11/18 0545)  . vancomycin Stopped (07/10/18 1838)   PRN: sodium chloride, acetaminophen **OR** acetaminophen, albuterol, ALPRAZolam, HYDROcodone-acetaminophen, morphine injection, nicotine,  ondansetron **OR** ondansetron (ZOFRAN) IV, polyethylene glycol  Assessment: Pharmacy was consulted for heparin.  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 Will follow aPTT.   Goal of Therapy:  APTT level 66-102 seconds or Heparin level 0.3-0.7 units/ml once heparin level correlates with aPTT  Monitor platelets by anticoagulation protocol: Yes   Heparin Course: 1/8 PM initiation: 1800 unit bolus, then 550 units/hr 1/9 0056 aPTT 84s 1/9 0902 aPTT 84s  Plan:  Second confirmatory level is therapeutic Continue to monitor H&H and platelets  Recheck aPTT, heparin level and CBC with tomorrow AM labs.  1/10 AM aPTT 55, HL 0.44. Increase to 650 units/hr and recheck aPTT in 6 hours.  Haralambos Yeatts S, PharmD 07/11/2018,6:50 AM

## 2018-07-11 NOTE — Progress Notes (Signed)
 Vein & Vascular Surgery Daily Progress Note   Vascular Surgery Communication Note  Patient given diet after debridement. Unable to move forward with angiogram today. Will reschedule right lower extremity angiogram with possible intervention with Dr. Wyn Quaker on Monday and Left lower extremity angiogram with possible intervention with Dr. Gilda Crease on Tuesday.   Will pre-op.  Cleda Daub PA-C 07/11/2018 2:53 PM

## 2018-07-11 NOTE — Care Management (Signed)
Patient admitted from home with bilateral infected diabetic foot wounds.  Patient lives at home with family.   PCP Dr. Butler Denmark.  Patient has WC, RW, and BSC in the home.  Patient is open with [facility] home health for RN.  Elnita Maxwell with [facility] notified of admission.   Patient went to OR today for debridement.  Per podiatry osteomyelitis to right foot.  Wound Vac was placed.  Per vascular plan for  right lower extremity angiogram with possible intervention with Dr. Wyn Quaker on Monday and Left lower extremity angiogram with possible intervention with Dr. Gilda Crease on Tuesday.

## 2018-07-11 NOTE — Progress Notes (Signed)
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  Allergies  Allergen Reactions  . Eggs Or Egg-Derived Products Diarrhea, Itching and Nausea And Vomiting    Patient Measurements: Height: 5\' 2"  (157.5 cm) IBW/kg (Calculated) : 50.1 Heparin Dosing Weight: 36.6 kg - reported by Jacqulyn Cane RN  Vital Signs: Temp: 98.6 F (37 C) (01/10 1602) Temp Source: Oral (01/10 1602) BP: 102/52 (01/10 1602) Pulse Rate: 97 (01/10 1602)  Labs: Recent Labs    07/09/18 1758  07/09/18 1842  07/09/18 1913 07/10/18 0056 07/10/18 0902 07/11/18 0547  HGB  --    < > 10.0*  --   --  8.9*  --  8.3*  HCT  --   --  32.8*  --   --  29.2*  --  27.0*  PLT  --   --  241  --   --  233  --  192  APTT  --   --   --    < > 39* 84* 84* 55*  LABPROT  --   --   --   --  16.2*  --   --  13.4  INR  --   --   --   --  1.32  --   --  1.03  HEPARINUNFRC  --   --   --   --  1.67*  --   --  0.44  CREATININE 0.51  --   --   --   --  1.04*  --  1.04*   < > = values in this interval not displayed.    CrCl cannot be calculated (Unknown ideal weight.).   Assessment: Pharmacy was consulted for heparin.  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 Will follow aPTT.   Goal of Therapy:  APTT level 66-102 seconds or Heparin level 0.3-0.7 units/ml once heparin level correlates with aPTT  Monitor platelets by anticoagulation protocol: Yes   Heparin Course: 1/8 PM initiation: 1800 unit bolus, then 550 units/hr 1/9 0056 aPTT 84s 1/9 0902 aPTT 84s 1/10 AM aPTT 55, HL 0.44. Increase to 650 units/hr and recheck aPTT in 6 hours.  Plan:  Per Dr Ether Griffins restart heparin tomorrow at 7 am - order timed. Will need to follow up restart of heparin drip and adjust lab timing accordingly.  Continue to monitor H&H and platelets  Will order heparin level and CBC with tomorrow AM  labs; aPTT ordered for 1300 tomorrow.    Marty Heck, PharmD 07/11/2018,6:57 PM

## 2018-07-11 NOTE — Anesthesia Preprocedure Evaluation (Signed)
Anesthesia Evaluation  Patient identified by MRN, date of birth, ID band Patient awake    Reviewed: Allergy & Precautions, NPO status , Patient's Chart, lab work & pertinent test results  History of Anesthesia Complications Negative for: history of anesthetic complications  Airway Mallampati: II  TM Distance: >3 FB Neck ROM: Full    Dental  (+) Edentulous Upper, Edentulous Lower   Pulmonary neg sleep apnea, COPD,  COPD inhaler, Current Smoker (states quit 1 month ago),    breath sounds clear to auscultation- rhonchi (-) wheezing      Cardiovascular (-) hypertension+ Peripheral Vascular Disease  (-) CAD, (-) Past MI, (-) Cardiac Stents and (-) CABG  Rhythm:Regular Rate:Normal - Systolic murmurs and - Diastolic murmurs    Neuro/Psych neg Seizures Anxiety negative neurological ROS     GI/Hepatic negative GI ROS, Neg liver ROS,   Endo/Other  diabetes, Insulin Dependent  Renal/GU negative Renal ROS     Musculoskeletal negative musculoskeletal ROS (+)   Abdominal (+) - obese,   Peds  Hematology negative hematology ROS (+)   Anesthesia Other Findings Past Medical History: No date: Anxiety No date: Colon polyps No date: COPD (chronic obstructive pulmonary disease) (HCC) No date: Diabetes mellitus without complication (HCC) No date: DVT (deep venous thrombosis) (HCC) No date: DVT (deep venous thrombosis) (HCC) No date: History of colonic polyps No date: Hyperlipidemia No date: Peripheral artery disease (HCC) 08/2012: Pulmonary nodules/lesions, multiple     Comment:  NEGATIVE PET SCAN No date: Retroperitoneal abscess (HCC)     Comment:  history of in 04/2013 No date: Tobacco abuse   Reproductive/Obstetrics                             Anesthesia Physical Anesthesia Plan  ASA: III  Anesthesia Plan: General   Post-op Pain Management:    Induction: Intravenous  PONV Risk Score and Plan:  1 and Propofol infusion  Airway Management Planned: Natural Airway  Additional Equipment:   Intra-op Plan:   Post-operative Plan:   Informed Consent: I have reviewed the patients History and Physical, chart, labs and discussed the procedure including the risks, benefits and alternatives for the proposed anesthesia with the patient or authorized representative who has indicated his/her understanding and acceptance.   Dental advisory given  Plan Discussed with: CRNA and Anesthesiologist  Anesthesia Plan Comments:         Anesthesia Quick Evaluation

## 2018-07-11 NOTE — Transfer of Care (Signed)
Immediate Anesthesia Transfer of Care Note  Patient: April [name]  Procedure(s) Performed: FOOT DEBRIDEMENT (Right Foot) APPLICATION OF WOUND VAC (Right Foot)  Patient Location: PACU  Anesthesia Type:General  Level of Consciousness: awake, alert  and oriented  Airway & Oxygen Therapy: Patient Spontanous Breathing  Post-op Assessment: Report given to RN and Post -op Vital signs reviewed and stable  Post vital signs: Reviewed and stable  Last Vitals:  Vitals Value Taken Time  BP 96/44 07/11/2018  1:15 PM  Temp    Pulse 86 07/11/2018  1:16 PM  Resp 15 07/11/2018  1:16 PM  SpO2 100 % 07/11/2018  1:16 PM  Vitals shown include unvalidated device data.  Last Pain:  Vitals:   07/11/18 1315  TempSrc:   PainSc: 0-No pain      Patients Stated Pain Goal: 0 (07/09/18 1749)  Complications: No apparent anesthesia complications

## 2018-07-12 ENCOUNTER — Encounter: Payer: Self-pay | Admitting: Podiatry

## 2018-07-12 LAB — GLUCOSE, CAPILLARY
GLUCOSE-CAPILLARY: 226 mg/dL — AB (ref 70–99)
GLUCOSE-CAPILLARY: 271 mg/dL — AB (ref 70–99)
Glucose-Capillary: 358 mg/dL — ABNORMAL HIGH (ref 70–99)
Glucose-Capillary: 261 mg/dL — ABNORMAL HIGH (ref 70–99)
Glucose-Capillary: 165 mg/dL — ABNORMAL HIGH (ref 70–99)

## 2018-07-12 LAB — HEPARIN LEVEL (UNFRACTIONATED)
Heparin Unfractionated: 0.23 IU/mL — ABNORMAL LOW (ref 0.30–0.70)
Heparin Unfractionated: 0.39 IU/mL (ref 0.30–0.70)
Heparin Unfractionated: 0.41 IU/mL (ref 0.30–0.70)

## 2018-07-12 LAB — CBC
HCT: 26.2 % — ABNORMAL LOW (ref 36.0–46.0)
Hemoglobin: 7.8 g/dL — ABNORMAL LOW (ref 12.0–15.0)
MCH: 27.9 pg (ref 26.0–34.0)
MCHC: 29.8 g/dL — ABNORMAL LOW (ref 30.0–36.0)
MCV: 93.6 fL (ref 80.0–100.0)
Platelets: 176 10*3/uL (ref 150–400)
RBC: 2.8 MIL/uL — ABNORMAL LOW (ref 3.87–5.11)
RDW: 14.6 % (ref 11.5–15.5)
WBC: 8.1 10*3/uL (ref 4.0–10.5)
nRBC: 0 % (ref 0.0–0.2)

## 2018-07-12 LAB — BASIC METABOLIC PANEL
Anion gap: 7 (ref 5–15)
BUN: 26 mg/dL — ABNORMAL HIGH (ref 8–23)
CO2: 28 mmol/L (ref 22–32)
Calcium: 8.3 mg/dL — ABNORMAL LOW (ref 8.9–10.3)
Chloride: 102 mmol/L (ref 98–111)
Creatinine, Ser: 0.99 mg/dL (ref 0.44–1.00)
GFR calc Af Amer: >60 mL/min (ref >60)
GFR calc non Af Amer: >60 mL/min (ref >60)
Glucose, Bld: 316 mg/dL — ABNORMAL HIGH (ref 70–99)
Potassium: 4.2 mmol/L (ref 3.5–5.1)
Sodium: 137 mmol/L (ref 135–145)

## 2018-07-12 LAB — APTT
aPTT: 53 seconds — ABNORMAL HIGH (ref 24–36)
aPTT: 76 seconds — ABNORMAL HIGH (ref 24–36)

## 2018-07-12 MED ORDER — INSULIN GLARGINE 100 UNIT/ML ~~LOC~~ SOLN
15.0000 [IU] | Freq: Every day | SUBCUTANEOUS | Status: DC
  Administered 2018-07-12: 15 [IU] via SUBCUTANEOUS
  Filled 2018-07-12 (×2): qty 0.15

## 2018-07-12 NOTE — Progress Notes (Signed)
[facility] - [place] at [facility]   PATIENT NAME: April [name]    MR#:  [id number]  DATE OF BIRTH:  06-11-1956  SUBJECTIVE:  CHIEF COMPLAINT: Pain is okay, patient's daughter at bedside  REVIEW OF SYSTEMS:  CONSTITUTIONAL: No fever, fatigue or weakness.  EYES: No blurred or double vision.  EARS, NOSE, AND THROAT: No tinnitus or ear pain.  RESPIRATORY: No cough, shortness of breath, wheezing or hemoptysis.  CARDIOVASCULAR: No chest pain, orthopnea, edema.  GASTROINTESTINAL: No nausea, vomiting, diarrhea or abdominal pain.  GENITOURINARY: No dysuria, hematuria.  ENDOCRINE: No polyuria, nocturia,  HEMATOLOGY: No anemia, easy bruising or bleeding SKIN: Right foot in dressing and has left heel ulcer MUSCULOSKELETAL: No joint pain or arthritis.   NEUROLOGIC: No tingling, numbness, weakness.  PSYCHIATRY: No anxiety or depression.   DRUG ALLERGIES:   Allergies  Allergen Reactions  . Eggs Or Egg-Derived Products Diarrhea, Itching and Nausea And Vomiting    VITALS:  Blood pressure (!) 103/54, pulse 94, temperature 98.3 F (36.8 C), temperature source Oral, resp. rate 19, height 5\' 2"  (1.575 m), SpO2 97 %.  PHYSICAL EXAMINATION:  GENERAL:  63 y.o.-year-old patient lying in the bed with no acute distress.  EYES: Pupils equal, round, reactive to light and accommodation. No scleral icterus. Extraocular muscles intact.  HEENT: Head atraumatic, normocephalic. Oropharynx and nasopharynx clear.  NECK:  Supple, no jugular venous distention. No thyroid enlargement, no tenderness.  LUNGS: Normal breath sounds bilaterally, no wheezing, rales,rhonchi or crepitation. No use of accessory muscles of respiration.  CARDIOVASCULAR: S1, S2 normal. No murmurs, rubs, or gallops.  ABDOMEN: Soft, nontender, nondistended. Bowel sounds present.  EXTREMITIES: No pedal edema, cyanosis, or clubbing.  NEUROLOGIC: Awake, alert and oriented x3 sensation intact. Gait not checked.   PSYCHIATRIC: The patient is alert and oriented x 3.  SKIN: Right foot with wound VAC left heel ulcer with necrosis  LABORATORY PANEL:   CBC Recent Labs  Lab 07/12/18 0433  WBC 8.1  HGB 7.8*  HCT 26.2*  PLT 176   ------------------------------------------------------------------------------------------------------------------  Chemistries  Recent Labs  Lab 07/11/18 0547 07/12/18 0433  NA 134* 137  K 4.4 4.2  CL 98 102  CO2 28 28  GLUCOSE 428* 316*  BUN 32* 26*  CREATININE 1.04* 0.99  CALCIUM 8.2* 8.3*  MG 2.1  --    ------------------------------------------------------------------------------------------------------------------  Cardiac Enzymes No results for input(s): TROPONINI in the last 168 hours. ------------------------------------------------------------------------------------------------------------------  RADIOLOGY:  No results found.  EKG:   Orders placed or performed during the hospital encounter of 06/03/18  . ED EKG  . ED EKG  . EKG 12-Lead  . 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  No signs of sepsis on admission. -Podiatry consult has been seen by Dr. 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 -Hold  Xarelto for angiogram on Monday and Tuesday  on heparin drip  COPD-stable, no signs of acute exacerbation -Continue home inhalers  Uncontrolled type 2 diabetes- recent A1c 10.9. -Continue  Lantus dose increased to 15 units coverage and SSI  Hyperlipidemia-stable -Continue home statin  Anxiety-stable -Continue home Xanax  Severe protein-calorie malnutrition -Continue home Premier protein twice daily between meals  Head lice- permethrin  Tobacco abuse -Nicotine patch as needed  All the records are reviewed and case discussed with Care Management/Social Workerr. Management plans discussed with the patient, daughter at bedside and they are in agreement  CODE STATUS: fc  TOTAL TIME TAKING CARE OF THIS PATIENT: 35 minutes.   POSSIBLE D/C IN 2 DAYS, DEPENDING ON CLINICAL CONDITION.  Note: This dictation was prepared with Dragon dictation along with smaller phrase technology. Any transcriptional errors that result from this process are unintentional.   Ramonita LabAruna Lilliah Priego M.D on 07/12/2018 at 4:03 PM  Between 7am to 6pm - Pager - [phone] After 6pm go to [email or web address] - password [email or web address] [email or web address]  [facility]  Office  [phone]  CC: Primary care physician; Mickel FuchsWroth, Thomas H, MD

## 2018-07-12 NOTE — Progress Notes (Signed)
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  Allergies  Allergen Reactions  . Eggs Or Egg-Derived Products Diarrhea, Itching and Nausea And Vomiting    Patient Measurements: Height: 5\' 2"  (157.5 cm) IBW/kg (Calculated) : 50.1 Heparin Dosing Weight: 36.6 kg - reported by Jacqulyn Cane RN  Vital Signs: Temp: 98.3 F (36.8 C) (01/11 0543) Temp Source: Oral (01/11 0543) BP: 103/54 (01/11 1146) Pulse Rate: 94 (01/11 1146)  Labs: Recent Labs    07/09/18 1913 07/10/18 0056 07/10/18 0902 07/11/18 0547 07/12/18 0433 07/12/18 1326  HGB  --  8.9*  --  8.3* 7.8*  --   HCT  --  29.2*  --  27.0* 26.2*  --   PLT  --  233  --  192 176  --   APTT 39* 84* 84* 55*  --  53*  LABPROT 16.2*  --   --  13.4  --   --   INR 1.32  --   --  1.03  --   --   HEPARINUNFRC 1.67*  --   --  0.44 0.23* 0.39  CREATININE  --  1.04*  --  1.04* 0.99  --     CrCl cannot be calculated (Unknown ideal weight.).   Assessment: Pharmacy was consulted for heparin.  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 Will follow aPTT.   Goal of Therapy:  APTT level 66-102 seconds or Heparin level 0.3-0.7 units/ml once heparin level correlates with aPTT  Monitor platelets by anticoagulation protocol: Yes   Plan:  Per Dr Ether Griffins restart heparin tomorrow at 7 am - order timed. Will need to follow up restart of heparin drip and adjust lab timing accordingly.  Continue to monitor H&H and platelets   Heparin Course: 1/8 PM initiation: 1800 unit bolus, then 550 units/hr 1/9 0056 aPTT 84s 1/9 0902 aPTT 84s 1/10 AM aPTT 55, HL 0.44. Increase to 650 units/hr and recheck aPTT in 6 hours. 1/11 1326 aPTT 53, HL 0.39. Increase to 750 units/hr. Recheck aPTT in 6 hours.    Hagen Bohorquez K, RPH 07/12/2018,1:57 PM

## 2018-07-12 NOTE — Progress Notes (Signed)
Wound vac intact. Will change tomorrow.. For angio on monday

## 2018-07-12 NOTE — Progress Notes (Signed)
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  Allergies  Allergen Reactions  . Eggs Or Egg-Derived Products Diarrhea, Itching and Nausea And Vomiting    Patient Measurements: Height: 5\' 2"  (157.5 cm) IBW/kg (Calculated) : 50.1 Heparin Dosing Weight: 36.6 kg - reported by Jacqulyn Cane RN  Vital Signs: BP: 103/54 (01/11 1146) Pulse Rate: 94 (01/11 1146)  Labs: Recent Labs    07/10/18 0056  07/11/18 0547 07/12/18 0433 07/12/18 1326 07/12/18 1945  HGB 8.9*  --  8.3* 7.8*  --   --   HCT 29.2*  --  27.0* 26.2*  --   --   PLT 233  --  192 176  --   --   APTT 84*   < > 55*  --  53* 76*  LABPROT  --   --  13.4  --   --   --   INR  --   --  1.03  --   --   --   HEPARINUNFRC  --    < > 0.44 0.23* 0.39 0.41  CREATININE 1.04*  --  1.04* 0.99  --   --    < > = values in this interval not displayed.    CrCl cannot be calculated (Unknown ideal weight.).   Assessment: Pharmacy was consulted for heparin.  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 Will follow aPTT.   Goal of Therapy:  APTT level 66-102 seconds or Heparin level 0.3-0.7 units/ml once heparin level correlates with aPTT  Monitor platelets by anticoagulation protocol: Yes   Plan:  Per Dr Ether Griffins restart heparin tomorrow at 7 am - order timed. Will need to follow up restart of heparin drip and adjust lab timing accordingly.  Continue to monitor H&H and platelets   Heparin Course: 1/8 PM initiation: 1800 unit bolus, then 550 units/hr 1/9 0056 aPTT 84s 1/9 0902 aPTT 84s 1/10 AM aPTT 55, HL 0.44. Increase to 650 units/hr and recheck aPTT in 6 hours. 1/11 1326 aPTT 53, HL 0.39. Increase to 750 units/hr. Recheck aPTT in 6 hours.  1/11 1945 aPTT/HL therapeutic x 1. Continue current rate. Recheck HL in 6 hours.   Carola Frost, [facility] 07/12/2018,8:54 PM

## 2018-07-13 LAB — VANCOMYCIN, PEAK: Vancomycin Pk: 32 ug/mL (ref 30–40)

## 2018-07-13 LAB — CBC
HCT: 27.9 % — ABNORMAL LOW (ref 36.0–46.0)
HEMOGLOBIN: 8.3 g/dL — AB (ref 12.0–15.0)
MCH: 28 pg (ref 26.0–34.0)
MCHC: 29.7 g/dL — AB (ref 30.0–36.0)
MCV: 94.3 fL (ref 80.0–100.0)
Platelets: 187 10*3/uL (ref 150–400)
RBC: 2.96 MIL/uL — ABNORMAL LOW (ref 3.87–5.11)
RDW: 14.6 % (ref 11.5–15.5)
WBC: 8.1 10*3/uL (ref 4.0–10.5)
nRBC: 0 % (ref 0.0–0.2)

## 2018-07-13 LAB — GLUCOSE, CAPILLARY
GLUCOSE-CAPILLARY: 155 mg/dL — AB (ref 70–99)
GLUCOSE-CAPILLARY: 194 mg/dL — AB (ref 70–99)
Glucose-Capillary: 282 mg/dL — ABNORMAL HIGH (ref 70–99)
Glucose-Capillary: 172 mg/dL — ABNORMAL HIGH (ref 70–99)

## 2018-07-13 LAB — HEPARIN LEVEL (UNFRACTIONATED): Heparin Unfractionated: 0.39 IU/mL (ref 0.30–0.70)

## 2018-07-13 MED ORDER — INSULIN GLARGINE 100 UNIT/ML ~~LOC~~ SOLN
10.0000 [IU] | Freq: Every day | SUBCUTANEOUS | Status: DC
  Administered 2018-07-13 – 2018-07-24 (×12): 10 [IU] via SUBCUTANEOUS
  Filled 2018-07-13 (×14): qty 0.1

## 2018-07-13 NOTE — Progress Notes (Signed)
Daily Progress Note   Subjective  - 2 Days Post-Op  F/u right foot and left heel.  Objective Vitals:   07/12/18 0543 07/12/18 1146 07/12/18 2134 07/13/18 0527  BP: (!) 104/48 (!) 103/54 (!) 104/48 (!) 107/51  Pulse: 90 94 89 88  Resp: 19  18 15   Temp: 98.3 F (36.8 C)  97.8 F (36.6 C) 98 F (36.7 C)  TempSrc: Oral  Oral Oral  SpO2: 97% 97% 99% 97%  Height:        Physical Exam: Right foot wound measures 6 x 2 cm.  Still full-thickness but a nice healthy granular tissue base is noted today.  No signs of active infection or drainage.  Left heel with large 4 cm necrotic heel ulceration.  Slight amount of fibrotic drainage on the dressing noted.  Minimal to mild surrounding erythema.  Eschar is becoming loose.\  Left heel   Right medial arch     Laboratory CBC    Component Value Date/Time   WBC 8.1 07/13/2018 0159   HGB 8.3 (L) 07/13/2018 0159   HGB 13.6 10/14/2014 0644   HCT 27.9 (L) 07/13/2018 0159   HCT 41.3 10/14/2014 0644   PLT 187 07/13/2018 0159   PLT 156 10/14/2014 0644    BMET    Component Value Date/Time   NA 137 07/12/2018 0433   NA 142 10/14/2014 0644   K 4.2 07/12/2018 0433   K 3.6 10/14/2014 0644   CL 102 07/12/2018 0433   CL 109 10/14/2014 0644   CO2 28 07/12/2018 0433   CO2 29 10/14/2014 0644   GLUCOSE 316 (H) 07/12/2018 0433   GLUCOSE 142 (H) 10/14/2014 0644   BUN 26 (H) 07/12/2018 0433   BUN 9 10/14/2014 0644   CREATININE 0.99 07/12/2018 0433   CREATININE 0.53 10/14/2014 0644   CALCIUM 8.3 (L) 07/12/2018 0433   CALCIUM 7.7 (L) 10/14/2014 0644   GFRNONAA >60 07/12/2018 0433   GFRNONAA >60 10/14/2014 0644   GFRAA >60 07/12/2018 0433   GFRAA >60 10/14/2014 0644    Assessment/Planning: Wound VAC dressing changed today on the right foot. Padded dressing change on the left heel.   Patient is scheduled for revascularization for left and right lower extremities over the next couple of days.  Will continue with wound VAC dressing  changes on the right foot and padded dressing on the left heel.  Patient still at high risk of undergoing B-K amputation to either side.  Cultures show MRSA.  Will ask infectious disease to evaluate.  Continue with strict nonweightbearing bilaterally.  April [name] A  07/13/2018, 11:13 AM

## 2018-07-13 NOTE — Progress Notes (Signed)
[facility] [place] [facility]   PATIENT NAME: April [name]    MR#:  [id number]  DATE OF BIRTH:  01-25-1956  SUBJECTIVE:  CHIEF COMPLAINT: Pain is in pain patient's daughter at bedside  REVIEW OF SYSTEMS:  CONSTITUTIONAL: No fever, fatigue or weakness.  EYES: No blurred or double vision.  EARS, NOSE, AND THROAT: No tinnitus or ear pain.  RESPIRATORY: No cough, shortness of breath, wheezing or hemoptysis.  CARDIOVASCULAR: No chest pain, orthopnea, edema.  GASTROINTESTINAL: No nausea, vomiting, diarrhea or abdominal pain.  GENITOURINARY: No dysuria, hematuria.  ENDOCRINE: No polyuria, nocturia,  HEMATOLOGY: No anemia, easy bruising or bleeding SKIN: Right foot in dressing and has left heel ulcer MUSCULOSKELETAL: No joint pain or arthritis.   NEUROLOGIC: No tingling, numbness, weakness.  PSYCHIATRY: No anxiety or depression.   DRUG ALLERGIES:   Allergies  Allergen Reactions  . Eggs Or Egg-Derived Products Diarrhea, Itching and Nausea And Vomiting    VITALS:  Blood pressure (!) 110/55, pulse 91, temperature 98 F (36.7 C), temperature source Oral, resp. rate 16, height 5\' 2"  (1.575 m), SpO2 98 %.  PHYSICAL EXAMINATION:  GENERAL:  63 y.o.-year-old patient lying in the bed with no acute distress.  EYES: Pupils equal, round, reactive to light and accommodation. No scleral icterus. Extraocular muscles intact.  HEENT: Head atraumatic, normocephalic. Oropharynx and nasopharynx clear.  NECK:  Supple, no jugular venous distention. No thyroid enlargement, no tenderness.  LUNGS: Normal breath sounds bilaterally, no wheezing, rales,rhonchi or crepitation. No use of accessory muscles of respiration.  CARDIOVASCULAR: S1, S2 normal. No murmurs, rubs, or gallops.  ABDOMEN: Soft, nontender, nondistended. Bowel sounds present.  EXTREMITIES: No pedal edema, cyanosis, or clubbing.  NEUROLOGIC: Awake, alert and oriented x3 sensation intact. Gait not checked.   PSYCHIATRIC: The patient is alert and oriented x 3.  SKIN: Right foot with wound VAC left heel ulcer with necrosis  LABORATORY PANEL:   CBC Recent Labs  Lab 07/13/18 0159  WBC 8.1  HGB 8.3*  HCT 27.9*  PLT 187   ------------------------------------------------------------------------------------------------------------------  Chemistries  Recent Labs  Lab 07/11/18 0547 07/12/18 0433  NA 134* 137  K 4.4 4.2  CL 98 102  CO2 28 28  GLUCOSE 428* 316*  BUN 32* 26*  CREATININE 1.04* 0.99  CALCIUM 8.2* 8.3*  MG 2.1  --    ------------------------------------------------------------------------------------------------------------------  Cardiac Enzymes No results for input(s): TROPONINI in the last 168 hours. ------------------------------------------------------------------------------------------------------------------  RADIOLOGY:  No results found.  EKG:   Orders placed or performed during the hospital encounter of 06/03/18  . ED EKG  . ED EKG  . EKG 12-Lead  . 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  No signs of sepsis on admission. -Podiatry consult has been seen by Dr. Ether Griffins, patient had debridement of the right foot and excision of the first distal right metatarsal 07/11/2018 and  placement of wound VAC , plan is to continue wound VAC with the dressing changes on the right foot and padded dressing on the left heel.  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 -Hold Xarelto for angiogram on Monday and Tuesday  on heparin drip  COPD-stable, no signs of acute exacerbation -Continue home inhalers  Uncontrolled type 2 diabetes- recent A1c 10.9. -Continue  Lantus dose decreased to 10 units tonight as patient is going to be n.p.o. after midnight and SSI  Hyperlipidemia-stable -Continue home statin  Anxiety-stable -Continue home Xanax  Severe protein-calorie malnutrition -Continue home Premier protein twice daily between meals  Head lice- permethrin  Tobacco abuse -Nicotine patch as needed  All the records are reviewed and case discussed with Care Management/Social Workerr. Management plans discussed with the patient, daughter at bedside and they are in agreement  CODE STATUS: fc  TOTAL TIME TAKING CARE OF THIS PATIENT: 35 minutes.   POSSIBLE D/C IN 2 DAYS, DEPENDING ON CLINICAL CONDITION.  Note: This dictation was prepared with Dragon dictation along with smaller phrase technology. Any transcriptional errors that result from this process are unintentional.   Ramonita Lab M.D on 07/13/2018 at 6:47 PM  Between 7am to 6pm - Pager - [phone] After 6pm go to [email or web address] - password [email or web address] [facility]  [facility]  Office  [phone]  CC: Primary care physician; Mickel Fuchs, MD

## 2018-07-13 NOTE — Progress Notes (Signed)
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  Allergies  Allergen Reactions  . Eggs Or Egg-Derived Products Diarrhea, Itching and Nausea And Vomiting    Patient Measurements: Height: 5\' 2"  (157.5 cm) IBW/kg (Calculated) : 50.1 Heparin Dosing Weight: 36.6 kg - reported by Jacqulyn Cane RN  Vital Signs: Temp: 97.8 F (36.6 C) (01/11 2134) Temp Source: Oral (01/11 2134) BP: 104/48 (01/11 2134) Pulse Rate: 89 (01/11 2134)  Labs: Recent Labs    07/11/18 0547 07/12/18 0433 07/12/18 1326 07/12/18 1945 07/13/18 0159  HGB 8.3* 7.8*  --   --  8.3*  HCT 27.0* 26.2*  --   --  27.9*  PLT 192 176  --   --  187  APTT 55*  --  53* 76*  --   LABPROT 13.4  --   --   --   --   INR 1.03  --   --   --   --   HEPARINUNFRC 0.44 0.23* 0.39 0.41 0.39  CREATININE 1.04* 0.99  --   --   --     CrCl cannot be calculated (Unknown ideal weight.).   Assessment: Pharmacy was consulted for heparin.  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 Will follow aPTT.   Goal of Therapy:  APTT level 66-102 seconds or Heparin level 0.3-0.7 units/ml once heparin level correlates with aPTT  Monitor platelets by anticoagulation protocol: Yes   Plan:  Per Dr Ether Griffins restart heparin tomorrow at 7 am - order timed. Will need to follow up restart of heparin drip and adjust lab timing accordingly.  Continue to monitor H&H and platelets   Heparin Course: 1/8 PM initiation: 1800 unit bolus, then 550 units/hr 1/9 0056 aPTT 84s 1/9 0902 aPTT 84s 1/10 AM aPTT 55, HL 0.44. Increase to 650 units/hr and recheck aPTT in 6 hours. 1/11 1326 aPTT 53, HL 0.39. Increase to 750 units/hr. Recheck aPTT in 6 hours.  1/11 1945 aPTT/HL therapeutic x 1. Continue current rate. Recheck HL in 6 hours. 1/12 AM heparin level 0.39. Continue current regimen. Recheck heparin level  and CBC with tomorrow AM labs.   Emoni Yang S, RPH 07/13/2018,3:29 AM

## 2018-07-14 ENCOUNTER — Other Ambulatory Visit (INDEPENDENT_AMBULATORY_CARE_PROVIDER_SITE_OTHER): Payer: Self-pay | Admitting: Vascular Surgery

## 2018-07-14 ENCOUNTER — Encounter
Admission: AD | Disposition: A | Discharge status: Skilled Nursing Facility | Payer: Self-pay | Source: Ambulatory Visit | Attending: Internal Medicine

## 2018-07-14 DIAGNOSIS — E1169 Type 2 diabetes mellitus with other specified complication: Principal | ICD-10-CM

## 2018-07-14 DIAGNOSIS — Z978 Presence of other specified devices: Secondary | ICD-10-CM

## 2018-07-14 DIAGNOSIS — Z87440 Personal history of urinary (tract) infections: Secondary | ICD-10-CM

## 2018-07-14 DIAGNOSIS — Z91012 Allergy to eggs: Secondary | ICD-10-CM

## 2018-07-14 DIAGNOSIS — Z86718 Personal history of other venous thrombosis and embolism: Secondary | ICD-10-CM

## 2018-07-14 DIAGNOSIS — F17200 Nicotine dependence, unspecified, uncomplicated: Secondary | ICD-10-CM

## 2018-07-14 DIAGNOSIS — Z794 Long term (current) use of insulin: Secondary | ICD-10-CM

## 2018-07-14 DIAGNOSIS — E11621 Type 2 diabetes mellitus with foot ulcer: Secondary | ICD-10-CM

## 2018-07-14 DIAGNOSIS — M869 Osteomyelitis, unspecified: Secondary | ICD-10-CM

## 2018-07-14 DIAGNOSIS — J449 Chronic obstructive pulmonary disease, unspecified: Secondary | ICD-10-CM

## 2018-07-14 DIAGNOSIS — B850 Pediculosis due to Pediculus humanus capitis: Secondary | ICD-10-CM

## 2018-07-14 DIAGNOSIS — E46 Unspecified protein-calorie malnutrition: Secondary | ICD-10-CM

## 2018-07-14 DIAGNOSIS — Z7901 Long term (current) use of anticoagulants: Secondary | ICD-10-CM

## 2018-07-14 DIAGNOSIS — B9562 Methicillin resistant Staphylococcus aureus infection as the cause of diseases classified elsewhere: Secondary | ICD-10-CM

## 2018-07-14 DIAGNOSIS — Z95820 Peripheral vascular angioplasty status with implants and grafts: Secondary | ICD-10-CM

## 2018-07-14 DIAGNOSIS — Z79899 Other long term (current) drug therapy: Secondary | ICD-10-CM

## 2018-07-14 DIAGNOSIS — Z681 Body mass index (BMI) 19 or less, adult: Secondary | ICD-10-CM

## 2018-07-14 DIAGNOSIS — D638 Anemia in other chronic diseases classified elsewhere: Secondary | ICD-10-CM

## 2018-07-14 DIAGNOSIS — L089 Local infection of the skin and subcutaneous tissue, unspecified: Secondary | ICD-10-CM

## 2018-07-14 DIAGNOSIS — Z960 Presence of urogenital implants: Secondary | ICD-10-CM

## 2018-07-14 DIAGNOSIS — R339 Retention of urine, unspecified: Secondary | ICD-10-CM

## 2018-07-14 DIAGNOSIS — Z89421 Acquired absence of other right toe(s): Secondary | ICD-10-CM

## 2018-07-14 DIAGNOSIS — Z8614 Personal history of Methicillin resistant Staphylococcus aureus infection: Secondary | ICD-10-CM

## 2018-07-14 DIAGNOSIS — E11628 Type 2 diabetes mellitus with other skin complications: Secondary | ICD-10-CM

## 2018-07-14 LAB — HEPARIN LEVEL (UNFRACTIONATED)
Heparin Unfractionated: 0.24 IU/mL — ABNORMAL LOW (ref 0.30–0.70)
Heparin Unfractionated: 0.34 IU/mL (ref 0.30–0.70)
Heparin Unfractionated: 0.17 IU/mL — ABNORMAL LOW (ref 0.30–0.70)

## 2018-07-14 LAB — SURGICAL PATHOLOGY

## 2018-07-14 LAB — GLUCOSE, CAPILLARY
GLUCOSE-CAPILLARY: 355 mg/dL — AB (ref 70–99)
Glucose-Capillary: 121 mg/dL — ABNORMAL HIGH (ref 70–99)
Glucose-Capillary: 79 mg/dL (ref 70–99)
Glucose-Capillary: 207 mg/dL — ABNORMAL HIGH (ref 70–99)
Glucose-Capillary: 59 mg/dL — ABNORMAL LOW (ref 70–99)
Glucose-Capillary: 273 mg/dL — ABNORMAL HIGH (ref 70–99)

## 2018-07-14 LAB — CBC
HCT: 26.3 % — ABNORMAL LOW (ref 36.0–46.0)
Hemoglobin: 8 g/dL — ABNORMAL LOW (ref 12.0–15.0)
MCH: 28.2 pg (ref 26.0–34.0)
MCHC: 30.4 g/dL (ref 30.0–36.0)
MCV: 92.6 fL (ref 80.0–100.0)
Platelets: 193 10*3/uL (ref 150–400)
RBC: 2.84 MIL/uL — ABNORMAL LOW (ref 3.87–5.11)
RDW: 14.6 % (ref 11.5–15.5)
WBC: 8.2 10*3/uL (ref 4.0–10.5)
nRBC: 0 % (ref 0.0–0.2)

## 2018-07-14 LAB — CREATININE, SERUM: Creatinine, Ser: 0.82 mg/dL (ref 0.44–1.00)

## 2018-07-14 LAB — AEROBIC/ANAEROBIC CULTURE (SURGICAL/DEEP WOUND)

## 2018-07-14 LAB — AEROBIC/ANAEROBIC CULTURE W GRAM STAIN (SURGICAL/DEEP WOUND)

## 2018-07-14 LAB — VANCOMYCIN, TROUGH: Vancomycin Tr: 17 ug/mL (ref 15–20)

## 2018-07-14 LAB — VANCOMYCIN, RANDOM: Vancomycin Rm: 17

## 2018-07-14 SURGERY — LOWER EXTREMITY ANGIOGRAPHY
Anesthesia: Moderate Sedation | Laterality: Right

## 2018-07-14 MED ORDER — CEFAZOLIN SODIUM-DEXTROSE 2-4 GM/100ML-% IV SOLN
2.0000 g | INTRAVENOUS | Status: AC
  Administered 2018-07-15: 2 g via INTRAVENOUS
  Filled 2018-07-14: qty 100

## 2018-07-14 MED ORDER — CEFAZOLIN SODIUM-DEXTROSE 2-4 GM/100ML-% IV SOLN
2.0000 g | Freq: Once | INTRAVENOUS | Status: AC
  Administered 2018-07-14: 2 g via INTRAVENOUS
  Filled 2018-07-14: qty 100

## 2018-07-14 MED ORDER — HEPARIN BOLUS VIA INFUSION
1750.0000 [IU] | Freq: Once | INTRAVENOUS | Status: AC
  Administered 2018-07-14: 1750 [IU] via INTRAVENOUS
  Filled 2018-07-14: qty 1750

## 2018-07-14 MED ORDER — DEXTROSE 50 % IV SOLN
1.0000 | Freq: Once | INTRAVENOUS | Status: AC
  Administered 2018-07-14: 50 mL via INTRAVENOUS

## 2018-07-14 MED ORDER — HEPARIN BOLUS VIA INFUSION
600.0000 [IU] | Freq: Once | INTRAVENOUS | Status: AC
  Administered 2018-07-14: 600 [IU] via INTRAVENOUS
  Filled 2018-07-14: qty 600

## 2018-07-14 MED ORDER — DEXTROSE 50 % IV SOLN
INTRAVENOUS | Status: AC
  Administered 2018-07-14: 50 mL via INTRAVENOUS
  Filled 2018-07-14: qty 50

## 2018-07-14 MED ORDER — VANCOMYCIN HCL 10 G IV SOLR
1250.0000 mg | INTRAVENOUS | Status: DC
  Filled 2018-07-14 (×3): qty 1250

## 2018-07-14 MED ORDER — SODIUM CHLORIDE 0.9 % IV SOLN
INTRAVENOUS | Status: DC
  Administered 2018-07-15: 02:00:00 via INTRAVENOUS

## 2018-07-14 NOTE — Progress Notes (Signed)
Vascular RN called and said that Dr. Wyn Quaker was not going to be able to perform the angiogram today and that the patient could go back on her previous diet today.  Orders placed.

## 2018-07-14 NOTE — Consult Note (Signed)
NAME: April [name]  DOB: [birth year]  MRN: [id number]  Date/Time: 07/14/2018 5:56 PM  Follow Subjective:  REASON FOR CONSULT: Diabetic foot infection ?Patient is a poor historian.  Chart reviewed.  Granddaughter at bedside April [name] is a 63 y.o. female with a history of diabetes mellitus, bilateral foot infection, history of recurrent DVT on Xarelto, smoker, she follows with Dr. Alberteen Spindle as outpatient. was admitted from the podiatry office with bilateral infected foot wounds.  Patient has a long history of bilateral foot infection.  She was hospitalized on  November   21st  until June 01, 2018 for sepsis and ulcers on her feet.  She was seen by podiatrist and vascular surgeon.  She underwent on  May 27, 2018  percutaneous transluminal angioplasty and stent placement in the right common iliac artery , left common iliac artery and angioplasty to the right superficial femoral artery, stent placement of the left external iliac artery by Dr. Marena Chancy.  She also had excisional debridement of the decubitus ulcer of the left heel.  She had incision and drainage of the deep abscess and removal of the sesamoid bone of the first metatarsal head on the right foot by podiatrist.  The wound culture had MRSA so was the urine culture.  Blood cultures were negative at that admission..  Based on culture and sensitivity patient was switched on discharge to oral doxycycline after getting IV vancomycin in the hospital. She was readmitted between 06/03/2018 to 06/13/2018 and during that hospitalization she was treated for COPD exacerbation and acute hypoxic respiratory failure.  She also had urinary retention and had a Foley catheter placed with follow-up with the urology.  She was treated for enterococcus UTI with amoxicillin on discharge.  On 06/29/2018 she went to [facility] emergency department for wound infection of the right foot surgical site.   patient did not want to get admitted and she left AMA.  She saw Dr.Cline  in his office on 07/09/2018 and was admitted to the hospital. She was seen by Dr. 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  She underwent excisional debridement up to the muscle on the right foot ulceration and also excision of the distal first metatarsal right foot.  And a wound VAC was placed.  The culture is positive again for MRSA.  She I am asked to see the patient for antibiotic management.  She is to undergo vascular procedure for both her lower extremities this admission.  She does have a necrotic eschar on the left heel.  Her antibiotic has been de-escalated to vancomycin now. Past Medical History:  Diagnosis Date  . Anxiety   . Colon polyps   . COPD (chronic obstructive pulmonary disease) (HCC)   . Diabetes mellitus without complication (HCC)   . DVT (deep venous thrombosis) (HCC)   . DVT (deep venous thrombosis) (HCC)   . History of colonic polyps   . Hyperlipidemia   . Peripheral artery disease (HCC)   . Pulmonary nodules/lesions, multiple 08/2012   NEGATIVE PET SCAN  . Retroperitoneal abscess (HCC)    history of in 04/2013  . Tobacco abuse     Past Surgical History:  Procedure Laterality Date  . APPLICATION OF WOUND VAC Right 07/11/2018   Procedure: APPLICATION OF WOUND VAC;  Surgeon: Gwyneth Revels, DPM;  Location: [facility] ORS;  Service: Podiatry;  Laterality: Right;  . CHOLECYSTECTOMY    . COLONOSCOPY  05/2007  . IRRIGATION AND DEBRIDEMENT  FOOT Bilateral 05/25/2018   Procedure: IRRIGATION AND DEBRIDEMENT FOOT application wound vac left  foot;  Surgeon: Recardo Evangelist, DPM;  Location: [facility] ORS;  Service: Podiatry;  Laterality: Bilateral;  . IRRIGATION AND DEBRIDEMENT FOOT Right 07/11/2018   Procedure: FOOT DEBRIDEMENT;  Surgeon: Gwyneth Revels, DPM;  Location: [facility] ORS;  Service: Podiatry;  Laterality: Right;  . LOWER EXTREMITY ANGIOGRAPHY Left 05/26/2018   Procedure: Lower Extremity Angiography  with possible intervention;  Surgeon: Annice Needy, MD;  Location: [facility] INVASIVE CV LAB;  Service: Cardiovascular;  Laterality: Left;  . LOWER EXTREMITY ANGIOGRAPHY Right 05/27/2018   Procedure: Lower Extremity Angiography;  Surgeon: Renford Dills, MD;  Location: [facility] INVASIVE CV LAB;  Service: Cardiovascular;  Laterality: Right;  Social history Patient is a current smoker Lives with her friend.  Family History  Problem Relation Age of Onset  . Brain cancer Mother    Allergies  Allergen Reactions  . Eggs Or Egg-Derived Products Diarrhea, Itching and Nausea And Vomiting   ? Current Facility-Administered Medications  Medication Dose Route Frequency Provider Last Rate Last Dose  . 0.9 %  sodium chloride infusion   Intravenous PRN Gwyneth Revels, DPM      . [START ON 07/15/2018] 0.9 %  sodium chloride infusion   Intravenous Continuous Stegmayer, Kimberly A, PA-C      . acetaminophen (TYLENOL) tablet 650 mg  650 mg Oral Q6H PRN Gwyneth Revels, DPM      . albuterol (PROVENTIL) (2.5 MG/3ML) 0.083% nebulizer solution 2.5 mg  2.5 mg Inhalation Q6H PRN Gwyneth Revels, DPM      . ALPRAZolam Prudy Feeler) tablet 0.5 mg  0.5 mg Oral BID PRN Gwyneth Revels, DPM      . aspirin chewable tablet 81 mg  81 mg Oral Daily Gwyneth Revels, DPM   81 mg at 07/13/18 1003  . atorvastatin (LIPITOR) tablet 40 mg  40 mg Oral q1800 Gwyneth Revels, DPM   40 mg at 07/14/18 1642  . [START ON 07/15/2018] ceFAZolin (ANCEF) IVPB 2g/100 mL premix  2 g Intravenous On Call Stegmayer, Kimberly A, PA-C      . ferrous sulfate tablet 325 mg  325 mg Oral BID WC Gwyneth Revels, DPM   325 mg at 07/14/18 1642  . furosemide (LASIX) tablet 40 mg  40 mg Oral Daily Gouru, Aruna, MD   40 mg at 07/13/18 1004  . guaiFENesin-dextromethorphan (ROBITUSSIN DM) 100-10 MG/5ML syrup 5 mL  5 mL Oral Q4H PRN Gouru, Aruna, MD      . heparin ADULT infusion 100 units/mL (25000 units/268mL sodium chloride 0.45%)  850 Units/hr Intravenous Continuous Gwyneth Revels, DPM 8.5 mL/hr at 07/14/18 1700 850 Units/hr at 07/14/18 1700  . HYDROcodone-acetaminophen (NORCO/VICODIN) 5-325 MG per tablet 1-2 tablet  1-2 tablet Oral Q6H PRN Gwyneth Revels, DPM   1 tablet at 07/14/18 1649  . insulin aspart (novoLOG) injection 0-15 Units  0-15 Units Subcutaneous TID WC Gwyneth Revels, DPM   8 Units at 07/14/18 1641  . insulin aspart (novoLOG) injection 0-5 Units  0-5 Units Subcutaneous QHS Gwyneth Revels, DPM   3 Units at 07/12/18 2110  . insulin aspart (novoLOG) injection 3 Units  3 Units Subcutaneous TID WC Gwyneth Revels, DPM   3 Units at 07/13/18 1757  . insulin glargine (LANTUS) injection 10 Units  10 Units Subcutaneous QHS Ramonita Lab, MD   10 Units at 07/13/18 2245  . mometasone-formoterol (DULERA) 200-5 MCG/ACT inhaler 2 puff  2 puff Inhalation BID Gwyneth Revels, DPM   2  puff at 07/13/18 2246  . morphine 4 MG/ML injection 4 mg  4 mg Intravenous Q4H PRN Gwyneth Revels, DPM   4 mg at 07/13/18 1457  . multivitamin-lutein (OCUVITE-LUTEIN) capsule 1 capsule  1 capsule Oral Daily Gwyneth Revels, DPM   1 capsule at 07/13/18 1248  . nicotine (NICODERM CQ - dosed in mg/24 hours) patch 14 mg  14 mg Transdermal Daily PRN Gwyneth Revels, DPM      . ondansetron [facility]) injection 4 mg  4 mg Intravenous Q6H PRN Gwyneth Revels, DPM      . pantoprazole (PROTONIX) EC tablet 40 mg  40 mg Oral BID Gwyneth Revels, DPM   40 mg at 07/13/18 2245  . permethrin (ELIMITE) 5 % cream 1 application  1 application Topical Once Gouru, Aruna, MD      . polyethylene glycol (MIRALAX / GLYCOLAX) packet 17 g  17 g Oral Daily PRN Gwyneth Revels, DPM      . polyethylene glycol (MIRALAX / GLYCOLAX) packet 17 g  17 g Oral Daily Gwyneth Revels, DPM   Stopped at 07/12/18 1051  . protein supplement (ENSURE MAX) liquid  11 oz Oral BID Gwyneth Revels, DPM   11 oz at 07/13/18 1014  . tamsulosin (FLOMAX) capsule 0.4 mg  0.4 mg Oral Daily Gwyneth Revels, DPM   0.4 mg at 07/13/18 1003  . tiotropium (SPIRIVA)  inhalation capsule ([facility] use ONLY) 18 mcg  18 mcg Inhalation Daily Gwyneth Revels, DPM   18 mcg at 07/13/18 1004  . vancomycin (VANCOCIN) 1,250 mg in sodium chloride 0.9 % 250 mL IVPB  1,250 mg Intravenous Q24H Lowella Bandy, RPH      . vitamin C (ASCORBIC ACID) tablet 500 mg  500 mg Oral BID Gwyneth Revels, DPM   500 mg at 07/13/18 2245     Abtx:  Anti-infectives (From admission, onward)   Start     Dose/Rate Route Frequency Ordered Stop   07/15/18 0500  ceFAZolin (ANCEF) IVPB 2g/100 mL premix    Note to Pharmacy:  Send with patient to specials   2 g 200 mL/hr over 30 Minutes Intravenous On call 07/14/18 1704 07/16/18 0500   07/14/18 1800  vancomycin (VANCOCIN) 1,250 mg in sodium chloride 0.9 % 250 mL IVPB     1,250 mg 166.7 mL/hr over 90 Minutes Intravenous Every 24 hours 07/14/18 1517     07/11/18 1800  vancomycin (VANCOCIN) IVPB 1000 mg/200 mL premix  Status:  Discontinued     1,000 mg 200 mL/hr over 60 Minutes Intravenous Every 24 hours 07/11/18 1631 07/14/18 1517   07/11/18 1645  piperacillin-tazobactam (ZOSYN) IVPB 3.375 g  Status:  Discontinued     3.375 g 12.5 mL/hr over 240 Minutes Intravenous Every 8 hours 07/11/18 1631 07/13/18 1847   07/10/18 1600  vancomycin (VANCOCIN) IVPB 1000 mg/200 mL premix  Status:  Discontinued     1,000 mg 200 mL/hr over 60 Minutes Intravenous Every 24 hours 07/10/18 1507 07/11/18 1518   07/09/18 2000  piperacillin-tazobactam (ZOSYN) IVPB 3.375 g  Status:  Discontinued     3.375 g 12.5 mL/hr over 240 Minutes Intravenous Every 8 hours 07/09/18 1911 07/11/18 1518   07/09/18 2000  vancomycin (VANCOCIN) IVPB 750 mg/150 ml premix  Status:  Discontinued     750 mg 150 mL/hr over 60 Minutes Intravenous Every 36 hours 07/09/18 1911 07/10/18 1507      REVIEW OF SYSTEMS:  Const: negative fever, negative chills, negative weight loss Eyes: negative diplopia or visual changes,  negative eye pain ENT: negative coryza, negative sore throat Resp: negative  cough, hemoptysis, dyspnea Cards: negative for chest pain, palpitations, lower extremity edema GU: Has a urinary catheter GI: Negative for abdominal pain, diarrhea, bleeding, constipation Skin: negative for rash and pruritus Heme: negative for easy bruising and gum/nose bleeding MS: Generalized weakness Neurolo:negative for headaches, dizziness, vertigo, memory problems, hard of hearing Psych: negative for feelings of anxiety, depression  Endocrine: No polydipsia Allergy/Immunology- negative for any medication  allergies ? Objective:  VITALS:  BP 105/62   Pulse 91   Temp 98.1 F (36.7 C) (Oral)   Resp 14   Ht 5\' 2"  (1.575 m)   Wt 47.8 kg   SpO2 98%   BMI 19.26 kg/m  PHYSICAL EXAM:  General: Alert, cooperative, no distress, appears older than her age, pale, hard of hearing.  Head: Normocephalic, without obvious abnormality, atraumatic.  Female pattern baldness Eyes: Conjunctivae clear, anicteric sclerae. Pupils are equal ENT Nares normal. No drainage or sinus tenderness. Lips, mucosa, and tongue normal. No Thrush, poor dentition Neck: Supple, symmetrical, no adenopathy, thyroid: non tender no carotid bruit and no JVD. Back: No CVA tenderness. Lungs: Clear to auscultation bilaterally. No Wheezing or Rhonchi. No rales. Heart: Regular rate and rhythm, no murmur, rub or gallop. Abdomen: Soft, non-tender,not distended. Bowel sounds normal. No masses Extremities: Right foot and left foot dressing .pictures taken by Dr. Ether GriffinsFowler reviewed       Prior to surgery      skin: No rashes or lesions. Or bruising Lymph: Cervical, supraclavicular normal. Neurologic: Grossly non-focal Pertinent Labs Lab Results CBC CBC Latest Ref Rng & Units 07/14/2018 07/13/2018 07/12/2018  WBC 4.0 - 10.5 K/uL 8.2 8.1 8.1  Hemoglobin 12.0 - 15.0 g/dL 8.0(L) 8.3(L) 7.8(L)  Hematocrit 36.0 - 46.0 % 26.3(L) 27.9(L) 26.2(L)  Platelets 150 - 400 K/uL 193 187 176      Component Value Date/Time   WBC 8.2  07/14/2018 0506   RBC 2.84 (L) 07/14/2018 0506   HGB 8.0 (L) 07/14/2018 0506   HGB 13.6 10/14/2014 0644   HCT 26.3 (L) 07/14/2018 0506   HCT 41.3 10/14/2014 0644   PLT 193 07/14/2018 0506   PLT 156 10/14/2014 0644   MCV 92.6 07/14/2018 0506   MCV 96 10/14/2014 0644   MCH 28.2 07/14/2018 0506   MCHC 30.4 07/14/2018 0506   RDW 14.6 07/14/2018 0506   RDW 13.8 10/14/2014 0644   LYMPHSABS 0.6 (L) 06/03/2018 0345   LYMPHSABS 2.7 10/14/2014 0644   MONOABS 0.5 06/03/2018 0345   MONOABS 0.5 10/14/2014 0644   EOSABS 0.0 06/03/2018 0345   EOSABS 0.3 10/14/2014 0644   BASOSABS 0.0 06/03/2018 0345   BASOSABS 0.0 10/14/2014 0644    CMP Latest Ref Rng & Units 07/14/2018 07/12/2018 07/11/2018  Glucose 70 - 99 mg/dL - 696(E316(H) 952(W428(H)  BUN 8 - 23 mg/dL - 41(L26(H) 24(M32(H)  Creatinine 0.44 - 1.00 mg/dL 0.100.82 2.720.99 5.36(U1.04(H)  Sodium 135 - 145 mmol/L - 137 134(L)  Potassium 3.5 - 5.1 mmol/L - 4.2 4.4  Chloride 98 - 111 mmol/L - 102 98  CO2 22 - 32 mmol/L - 28 28  Calcium 8.9 - 10.3 mg/dL - 8.3(L) 8.2(L)  Total Protein 6.5 - 8.1 g/dL - - -  Total Bilirubin 0.3 - 1.2 mg/dL - - -  Alkaline Phos 38 - 126 U/L - - -  AST 15 - 41 U/L - - -  ALT 0 - 44 U/L - - -  Microbiology: Recent Results (from the past 240 hour(s))  Aerobic/Anaerobic Culture (surgical/deep wound)     Status: None   Collection Time: 07/09/18  2:31 PM  Result Value Ref Range Status   Specimen Description   Final    HEEL Performed at [facility], [address] [place], [place] [address]    Special Requests   Final    NONE Performed at [facility], [address] [place], [place] [address]    Gram Stain   Final    MODERATE WBC PRESENT, PREDOMINANTLY PMN ABUNDANT GRAM POSITIVE COCCI    Culture   Final    MODERATE METHICILLIN RESISTANT STAPHYLOCOCCUS AUREUS NO ANAEROBES ISOLATED Performed at [facility], [address], [place], [place] [address]    Report Status 07/14/2018 FINAL  Final    Organism ID, Bacteria METHICILLIN RESISTANT STAPHYLOCOCCUS AUREUS  Final      Susceptibility   Methicillin resistant staphylococcus aureus - MIC*    CIPROFLOXACIN >=8 RESISTANT Resistant     ERYTHROMYCIN >=8 RESISTANT Resistant     GENTAMICIN <=0.5 SENSITIVE Sensitive     OXACILLIN >=4 RESISTANT Resistant     TETRACYCLINE <=1 SENSITIVE Sensitive     VANCOMYCIN 1 SENSITIVE Sensitive     TRIMETH/SULFA <=10 SENSITIVE Sensitive     CLINDAMYCIN <=0.25 SENSITIVE Sensitive     RIFAMPIN <=0.5 SENSITIVE Sensitive     Inducible Clindamycin NEGATIVE Sensitive     * MODERATE METHICILLIN RESISTANT STAPHYLOCOCCUS AUREUS  MRSA PCR Screening     Status: Abnormal   Collection Time: 07/10/18  6:21 PM  Result Value Ref Range Status   MRSA by PCR POSITIVE (A) NEGATIVE Final    Comment:        The GeneXpert MRSA Assay (FDA approved for NASAL specimens only), is one component of a comprehensive MRSA colonization surveillance program. It is not intended to diagnose MRSA infection nor to guide or monitor treatment for MRSA infections. RESULT CALLED TO, READ BACK BY AND VERIFIED WITH: KRISTY BASS 07/10/18 1957 KLW Performed at [facility], [address] [place], [place] [address]   Aerobic/Anaerobic Culture (surgical/deep wound)     Status: None (Preliminary result)   Collection Time: 07/11/18 12:40 PM  Result Value Ref Range Status   Specimen Description   Final    ULCER RIGHT FOOT WOUND Performed at [facility], [address] [place], [place] [address]    Special Requests   Final    NONE Performed at [facility], [address] [place], [place] [address]    Gram Stain NO WBC SEEN NO ORGANISMS SEEN   Final   Culture   Final    MODERATE METHICILLIN RESISTANT STAPHYLOCOCCUS AUREUS MODERATE STREPTOCOCCUS PYOGENES NO ANAEROBES ISOLATED; CULTURE IN PROGRESS FOR 5 DAYS    Report Status PENDING  Incomplete   Organism ID, Bacteria METHICILLIN RESISTANT  STAPHYLOCOCCUS AUREUS  Final      Susceptibility   Methicillin resistant staphylococcus aureus - MIC*    CIPROFLOXACIN >=8 RESISTANT Resistant     ERYTHROMYCIN <=0.25 SENSITIVE Sensitive     GENTAMICIN <=0.5 SENSITIVE Sensitive     OXACILLIN >=4 RESISTANT Resistant     TETRACYCLINE <=1 SENSITIVE Sensitive     VANCOMYCIN 1 SENSITIVE Sensitive     TRIMETH/SULFA <=10 SENSITIVE Sensitive     CLINDAMYCIN <=0.25 SENSITIVE Sensitive     RIFAMPIN <=0.5 SENSITIVE Sensitive     Inducible Clindamycin NEGATIVE Sensitive     * MODERATE METHICILLIN RESISTANT  STAPHYLOCOCCUS AUREUS   IMAGING RESULTS: Xray foot -rt ?Deep penetrating wound at the level of the first metatarsophalangeal joint, with cortical irregularity of the base of the first proximal phalanx, suggestive possible osteomyelitis   Impression/Recommendation ?63 y.o. female with a history of diabetes mellitus, bilateral foot infection, history of recurrent DVT on Xarelto, smoker, she follows with Dr. Alberteen Spindleline as outpatient. was admitted from the podiatry office with bilateral infected foot wounds.  Patient has a long history of bilateral foot infection.    ? Bilateral foot infection secondary to diabetes and peripheral vascular disease.  Status post right metatarsal amputation and debridement of the ulcer.  Has a wound VAC.  MRSA in the culture.  Left heel necrotic ulcer.  Will discuss with podiatrist regarding further debridement. Severe PAD.  Has had stents in both her extremities before.  ? Continue vancomycin.  She will need long-term IV antibiotics.    Diabetes mellitus.  Poorly controlled, recent A1c was 10.9.  On Lantus and NovoLog.  Management as per primary team  COPD current smoker.  Urinary retention has a urethral catheter and is on tamsulosin.  Not sure what her underlying pathology ?neurogenic bladder secondary diabetes  Anemia of chronic disease Protein calorie malnutrition  Head lice noted by nursing staff when she  got permethrin  Recurrent DVT on Xarelto ___________________________________________________ Discussed the management with her granddaughter and the patient Note:  This document was prepared using Dragon voice recognition software and may include unintentional dictation errors.

## 2018-07-14 NOTE — Progress Notes (Signed)
[facility] [place] [facility]   PATIENT NAME: April DonnaDoris Wimbush    MR#:  [id number]  DATE OF BIRTH:  09/03/1955  SUBJECTIVE:  CHIEF COMPLAINT: Patient is resting comfortably , for angiogram today  REVIEW OF SYSTEMS:  CONSTITUTIONAL: No fever, fatigue or weakness.  EYES: No blurred or double vision.  EARS, NOSE, AND THROAT: No tinnitus or ear pain.  RESPIRATORY: No cough, shortness of breath, wheezing or hemoptysis.  CARDIOVASCULAR: No chest pain, orthopnea, edema.  GASTROINTESTINAL: No nausea, vomiting, diarrhea or abdominal pain.  GENITOURINARY: No dysuria, hematuria.  ENDOCRINE: No polyuria, nocturia,  HEMATOLOGY: No anemia, easy bruising or bleeding SKIN: Right foot in dressing and has left heel ulcer MUSCULOSKELETAL: No joint pain or arthritis.   NEUROLOGIC: No tingling, numbness, weakness.  PSYCHIATRY: No anxiety or depression.   DRUG ALLERGIES:   Allergies  Allergen Reactions  . Eggs Or Egg-Derived Products Diarrhea, Itching and Nausea And Vomiting    VITALS:  Blood pressure 117/69, pulse 95, temperature 98.8 F (37.1 C), temperature source Oral, resp. rate 19, height 5\' 2"  (1.575 m), SpO2 99 %.  PHYSICAL EXAMINATION:  GENERAL:  63 y.o.-year-old patient lying in the bed with no acute distress.  EYES: Pupils equal, round, reactive to light and accommodation. No scleral icterus. Extraocular muscles intact.  HEENT: Head atraumatic, normocephalic. Oropharynx and nasopharynx clear.  NECK:  Supple, no jugular venous distention. No thyroid enlargement, no tenderness.  LUNGS: Normal breath sounds bilaterally, no wheezing, rales,rhonchi or crepitation. No use of accessory muscles of respiration.  CARDIOVASCULAR: S1, S2 normal. No murmurs, rubs, or gallops.  ABDOMEN: Soft, nontender, nondistended. Bowel sounds present.  EXTREMITIES: No pedal edema, cyanosis, or clubbing.  NEUROLOGIC: Awake, alert and oriented x3 sensation intact. Gait not checked.   PSYCHIATRIC: The patient is alert and oriented x 3.  SKIN: Right foot with wound VAC left heel ulcer with necrosis  LABORATORY PANEL:   CBC Recent Labs  Lab 07/14/18 0506  WBC 8.2  HGB 8.0*  HCT 26.3*  PLT 193   ------------------------------------------------------------------------------------------------------------------  Chemistries  Recent Labs  Lab 07/11/18 0547 07/12/18 0433  NA 134* 137  K 4.4 4.2  CL 98 102  CO2 28 28  GLUCOSE 428* 316*  BUN 32* 26*  CREATININE 1.04* 0.99  CALCIUM 8.2* 8.3*  MG 2.1  --    ------------------------------------------------------------------------------------------------------------------  Cardiac Enzymes No results for input(s): TROPONINI in the last 168 hours. ------------------------------------------------------------------------------------------------------------------  RADIOLOGY:  No results found.  EKG:   Orders placed or performed during the hospital encounter of 06/03/18  . ED EKG  . ED EKG  . EKG 12-Lead  . 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  No signs of sepsis on admission. -Podiatry consult has been seen by Dr. Ether GriffinsFowler, patient had debridement of the right foot and excision of the first distal right metatarsal 07/11/2018 and  placement of wound VAC , plan is to continue wound VAC with the dressing changes on the right foot and padded dressing on the left heel.  Still at high risk for below-knee amputation to either side -Can continue with Santyl dressing changes in the near future per podiatry -Wound cultures with MRSA sensitive to vancomycin and Clinda -Continue IV vancomycin and discontinue Zosyn -Podiatry consulted infectious disease- consult pending  -Podiatry recommending strict nonweightbearing  bilaterally -Pain control as needed X-ray of  the right foot possible osteomyelitis  Peripheral vascular disease Seen by vascular surgery right lower extremity angiogram Monday  and left leg angiogram tuesday  Recurrent left lower extremity DVT-on Xarelto at home. -Hold Xarelto for angiogram on Monday and Tuesday  on heparin drip  COPD-stable, no signs of acute exacerbation -Continue home inhalers  Uncontrolled type 2 diabetes- recent A1c 10.9. -Continue  Lantus dose decreased to 10 units tonight as patient is going to be n.p.o. after midnight and SSI  Hyperlipidemia-stable -Continue home statin  Anxiety-stable -Continue home Xanax  Severe protein-calorie malnutrition -Continue home Premier protein twice daily between meals  Head lice- permethrin  Tobacco abuse -Nicotine patch as needed  All the records are reviewed and case discussed with Care Management/Social Workerr. Management plans discussed with the patient, daughter at bedside and they are in agreement  CODE STATUS: fc  TOTAL TIME TAKING CARE OF THIS PATIENT: 33 minutes.   POSSIBLE D/C IN 2 DAYS, DEPENDING ON CLINICAL CONDITION.  Note: This dictation was prepared with Dragon dictation along with smaller phrase technology. Any transcriptional errors that result from this process are unintentional.   Ramonita Lab M.D on 07/14/2018 at 10:58 AM  Between 7am to 6pm - Pager - [phone] After 6pm go to [email or web address] - password [email or web address] [facility]  [facility]  Office  [phone]  CC: Primary care physician; Mickel Fuchs, MD

## 2018-07-14 NOTE — Progress Notes (Signed)
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  Allergies  Allergen Reactions  . Eggs Or Egg-Derived Products Diarrhea, Itching and Nausea And Vomiting    Patient Measurements: Height: 5\' 2"  (157.5 cm) Weight: 105 lb 4.8 oz (47.8 kg) IBW/kg (Calculated) : 50.1 Heparin Dosing Weight: 36.6 kg - reported by Jacqulyn Cane RN  Vital Signs: Temp: 98.5 F (36.9 C) (01/13 2006) Temp Source: Oral (01/13 2006) BP: 93/53 (01/13 2006) Pulse Rate: 93 (01/13 2006)  Labs: Recent Labs    07/12/18 0433 07/12/18 1326 07/12/18 1945 07/13/18 0159 07/14/18 0506 07/14/18 1432 07/14/18 2055  HGB 7.8*  --   --  8.3* 8.0*  --   --   HCT 26.2*  --   --  27.9* 26.3*  --   --   PLT 176  --   --  187 193  --   --   APTT  --  53* 76*  --   --   --   --   HEPARINUNFRC 0.23* 0.39 0.41 0.39 0.24* 0.34 0.17*  CREATININE 0.99  --   --   --   --  0.82  --     Estimated Creatinine Clearance: 53 mL/min (by C-G formula based on SCr of 0.82 mg/dL).   Assessment: Pharmacy was consulted for heparin.  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 Will follow aPTT.   Goal of Therapy:  APTT level 66-102 seconds or Heparin level 0.3-0.7 units/ml once heparin level correlates with aPTT  Monitor platelets by anticoagulation protocol: Yes   Plan:  Per Dr Ether Griffins restart heparin tomorrow at 7 am - order timed. Will need to follow up restart of heparin drip and adjust lab timing accordingly.  Continue to monitor H&H and platelets   Heparin Course: 1/8 PM initiation: 1800 unit bolus, then 550 units/hr 1/9 0056 aPTT 84s 1/9 0902 aPTT 84s 1/10 AM aPTT 55, HL 0.44. Increase to 650 units/hr and recheck aPTT in 6 hours. 1/11 1326 aPTT 53, HL 0.39. Increase to 750 units/hr. Recheck aPTT in 6 hours.  1/11 1945 aPTT/HL therapeutic x 1. Continue current rate.  Recheck HL in 6 hours. 1/12 AM heparin level 0.39. Continue current regimen. Recheck heparin level and CBC with tomorrow AM labs. 1/13 AM heparin level 0.24. 600 unit  bolus and increase rate t o 850 units/hr. Recheck in 6 hours.   1/13:  HL @ 2055 = 0.17 .   Heparin was not held during any point on 1/13, angiogram was not done.   Will order heparin 1750 units IV X 1 once and increase drip rate to 1050 units/hr.  Will recheck HL 6 hrs after rate change.   Julis Haubner D, RPH 07/14/2018,10:31 PM

## 2018-07-14 NOTE — Progress Notes (Signed)
CRITICAL VALUE ALERT  Critical Value:  CBG 59  Date & Time Notied:  07/14/2018 @1300   Provider Notified: Gouru  Orders Received/Actions taken: 1 amp D50 ordered and given per protocol. Awaiting additional orders from MD.

## 2018-07-14 NOTE — Progress Notes (Signed)
[facility] Daily Progress Note   Vascular Communication Note  Due to scheduling conflicts unable to complete angiogram today.  Will place on schedule for Right Lower Extremity Angiogram with Possible Intervention with Dr. Gilda Crease tomorrow (Tuesday). Will place on schedule for Left Lower Extremity Angiogram with Possible Intervention with Dr. Wyn Quaker on  tomorrow (Tuesday).  Will pre-op.  Cleda Daub PA-C 07/14/2018 4:52 PM

## 2018-07-14 NOTE — Progress Notes (Signed)
Pharmacy Antibiotic Note  April [name] is a 63 y.o. female admitted on 07/09/2018 with Wound infection.  Pharmacy has been consulted for vancomycin dosing.  She has received only the first dose of vancomycin.  During an admission here in November she received vancomycin for the same indication and her regimen was 1 gram every 24 hours resulting in a Vt of 12 mcg/mL. Vancomycin dose was started at a dose of 1000 mg every 24 hours (1st dose was 750mg ). A vancomycin peak level was drawn at steady-state and a random level the next morning were used to create a pharmacokinetic profile.  Plan: Because xray reveals osteomyelitis we will be more aggressive at this point and increase vancomycin to 1250 mg IV every 24 hours  Predicted AUC 653, Vd 26.4, Ke 0.036, t1/2 19.2h, Css 39.6/17.6 mcg/mL   Height: 5\' 2"  (157.5 cm) IBW/kg (Calculated) : 50.1  Temp (24hrs), Avg:98.5 F (36.9 C), Min:98 F (36.7 C), Max:98.8 F (37.1 C)  Recent Labs  Lab 07/09/18 1758  07/10/18 0056 07/10/18 1337 07/11/18 0547 07/12/18 0433 07/13/18 0159 07/13/18 2056 07/14/18 0506  WBC  --    < > 7.8  --  7.4 8.1 8.1  --  8.2  CREATININE 0.51  --  1.04*  --  1.04* 0.99  --   --   --   VANCOPEAK  --   --   --   --   --   --   --  32  --   VANCORANDOM  --   --   --  7  --   --   --   --   --    < > = values in this interval not displayed.    CrCl cannot be calculated (Unknown ideal weight.).    Antimicrobials this admission: 1/8 vancomcyin >>  1/8 pip/tazo >> 1/12  Dose adjustments this admission: 1/9 Vancomycin 750mg  every 36 hours-----> 1000mg  q24h 1/13 vancomycin 1000mg  q24h----> 1250mg  q24h  Microbiology results: 1/8 Heel Cx: MRSA  Thank you for allowing pharmacy to be a part of this patient's care.  Lowella Bandy, PharmD Clinical Pharmacist  07/14/2018 10:14 AM

## 2018-07-14 NOTE — Progress Notes (Signed)
PT Cancellation Note  Patient Details Name: April [name] MRN: [id number] DOB: 08-28-1955   Cancelled Treatment:    Reason Eval/Treat Not Completed: Pain limiting ability to participate.  PT consult received.  Chart reviewed.  Pt currently on bedrest with strict NWB'ing B LE's.  Nurse cleared pt for in bed ex's.  Pt sleeping upon PT arrival but woke easily with vc's.  Pt reporting 7.5/10 B foot pain and declining any in bed ex's today d/t B foot pain.  Nurse notified of pt's pain levels. Will re-attempt PT evaluation at a later date/time.  Hendricks Limes, PT 07/14/18, 3:31 PM [phone]

## 2018-07-14 NOTE — Progress Notes (Signed)
Nutrition Follow Up Note   DOCUMENTATION CODES:   Severe malnutrition in context of chronic illness  INTERVENTION:   Ensure Max protein supplement BID, each supplement provides 150kcal and 30g of protein.  Ocuvite daily for wound healing (provides zinc, vitamin A, vitamin C, Vitamin E, copper, and selenium)  Vitamin C 500mg  po BID  Liberal diet   NUTRITION DIAGNOSIS:   Severe Malnutrition related to chronic illness(COPD, DM) as evidenced by severe fat depletion, severe muscle depletion.  GOAL:   Patient will meet greater than or equal to 90% of their needs  MONITOR:   PO intake, Supplement acceptance, Weight trends, Labs, I & O's, Skin  ASSESSMENT:    63 y.o. 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   Pt s/p I & D with VAC placement 1/10.   Pt with fair appetite and oral intake in hospital; pt is drinking some Ensure Max. No new weight since admit; will request weekly weights. Recommend continue supplements and vitamin until wound healing complete.   Medications reviewed and include: aspirin, ferrous sulfate, lasix, insulin, ocuvite, protonix, miralax, vitamin C, NaCl @75ml /hr, heparin, vancomycin   Labs reviewed: BUN 26(H) Hgb 8.0(L), Hct 26.3(L) cbgs- 121, 79 x 24 hrs AIC 10.9(H)- 11/24  Diet Order:   Diet Order            Diet NPO time specified  Diet effective midnight        Diet Carb Modified Fluid consistency: Thin; Room service appropriate? Yes  Diet effective now             EDUCATION NEEDS:   Education needs have been addressed  Skin:  Skin Assessment: Reviewed RN Assessment(R foot wound 6 x 2 cm, L heel wound 4 cm )  Last BM:  1/13- type 4   Height:   Ht Readings from Last 1 Encounters:  07/10/18 5\' 2"  (1.575 m)    Weight:   Wt Readings from Last 1  Encounters:  06/13/18 49.6 kg    Ideal Body Weight:  50 kg  BMI:  Body mass index is 20 kg/m.  Estimated Nutritional Needs:   Kcal:  1300-1500kcal/day   Protein:  68-77g/day   Fluid:  >1.3L/day   Betsey Holiday MS, RD, LDN Pager #- [phone] Office#- [phone] After Hours Pager: [phone]

## 2018-07-14 NOTE — Care Management (Signed)
Patient currently with wound vac in place.  RNCM awaiting completion of angiograms to determine if patient will require vac at discharge.

## 2018-07-14 NOTE — Progress Notes (Signed)
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  Allergies  Allergen Reactions  . Eggs Or Egg-Derived Products Diarrhea, Itching and Nausea And Vomiting    Patient Measurements: Height: 5\' 2"  (157.5 cm) IBW/kg (Calculated) : 50.1 Heparin Dosing Weight: 36.6 kg - reported by Jacqulyn Cane RN  Vital Signs: Temp: 98.8 F (37.1 C) (01/13 0631) Temp Source: Oral (01/13 0631) BP: 117/69 (01/13 0631) Pulse Rate: 95 (01/13 0631)  Labs: Recent Labs    07/12/18 0433 07/12/18 1326 07/12/18 1945 07/13/18 0159 07/14/18 0506  HGB 7.8*  --   --  8.3* 8.0*  HCT 26.2*  --   --  27.9* 26.3*  PLT 176  --   --  187 193  APTT  --  53* 76*  --   --   HEPARINUNFRC 0.23* 0.39 0.41 0.39 0.24*  CREATININE 0.99  --   --   --   --     CrCl cannot be calculated (Unknown ideal weight.).   Assessment: Pharmacy was consulted for heparin.  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 Will follow aPTT.   Goal of Therapy:  APTT level 66-102 seconds or Heparin level 0.3-0.7 units/ml once heparin level correlates with aPTT  Monitor platelets by anticoagulation protocol: Yes   Plan:  Per Dr Ether Griffins restart heparin tomorrow at 7 am - order timed. Will need to follow up restart of heparin drip and adjust lab timing accordingly.  Continue to monitor H&H and platelets   Heparin Course: 1/8 PM initiation: 1800 unit bolus, then 550 units/hr 1/9 0056 aPTT 84s 1/9 0902 aPTT 84s 1/10 AM aPTT 55, HL 0.44. Increase to 650 units/hr and recheck aPTT in 6 hours. 1/11 1326 aPTT 53, HL 0.39. Increase to 750 units/hr. Recheck aPTT in 6 hours.  1/11 1945 aPTT/HL therapeutic x 1. Continue current rate. Recheck HL in 6 hours. 1/12 AM heparin level 0.39. Continue current regimen. Recheck heparin level and CBC with tomorrow AM labs. 1/13 AM heparin level 0.24. 600  unit  bolus and increase rate t o 850 units/hr. Recheck in 6 hours.  Olia Hinderliter S, RPH 07/14/2018,6:43 AM

## 2018-07-15 ENCOUNTER — Encounter: Payer: Self-pay | Admitting: Emergency Medicine

## 2018-07-15 ENCOUNTER — Other Ambulatory Visit (INDEPENDENT_AMBULATORY_CARE_PROVIDER_SITE_OTHER): Payer: Self-pay | Admitting: Nurse Practitioner

## 2018-07-15 ENCOUNTER — Other Ambulatory Visit (INDEPENDENT_AMBULATORY_CARE_PROVIDER_SITE_OTHER): Payer: Self-pay | Admitting: Vascular Surgery

## 2018-07-15 ENCOUNTER — Encounter
Admission: AD | Disposition: A | Discharge status: Skilled Nursing Facility | Payer: Self-pay | Source: Ambulatory Visit | Attending: Internal Medicine

## 2018-07-15 DIAGNOSIS — I70235 Atherosclerosis of native arteries of right leg with ulceration of other part of foot: Secondary | ICD-10-CM

## 2018-07-15 DIAGNOSIS — L97529 Non-pressure chronic ulcer of other part of left foot with unspecified severity: Secondary | ICD-10-CM

## 2018-07-15 DIAGNOSIS — I70245 Atherosclerosis of native arteries of left leg with ulceration of other part of foot: Secondary | ICD-10-CM

## 2018-07-15 DIAGNOSIS — L97519 Non-pressure chronic ulcer of other part of right foot with unspecified severity: Secondary | ICD-10-CM

## 2018-07-15 HISTORY — PX: LOWER EXTREMITY ANGIOGRAPHY: CATH118251

## 2018-07-15 LAB — BASIC METABOLIC PANEL
Anion gap: 7 (ref 5–15)
BUN: 28 mg/dL — AB (ref 8–23)
CO2: 29 mmol/L (ref 22–32)
Calcium: 8.4 mg/dL — ABNORMAL LOW (ref 8.9–10.3)
Chloride: 102 mmol/L (ref 98–111)
Creatinine, Ser: 0.68 mg/dL (ref 0.44–1.00)
GFR calc Af Amer: >60 mL/min (ref >60)
GFR calc non Af Amer: >60 mL/min (ref >60)
Glucose, Bld: 167 mg/dL — ABNORMAL HIGH (ref 70–99)
Potassium: 4 mmol/L (ref 3.5–5.1)
Sodium: 138 mmol/L (ref 135–145)

## 2018-07-15 LAB — GLUCOSE, CAPILLARY
GLUCOSE-CAPILLARY: 137 mg/dL — AB (ref 70–99)
Glucose-Capillary: 126 mg/dL — ABNORMAL HIGH (ref 70–99)
Glucose-Capillary: 93 mg/dL (ref 70–99)
Glucose-Capillary: 117 mg/dL — ABNORMAL HIGH (ref 70–99)
Glucose-Capillary: 235 mg/dL — ABNORMAL HIGH (ref 70–99)

## 2018-07-15 LAB — MAGNESIUM: Magnesium: 2.2 mg/dL (ref 1.7–2.4)

## 2018-07-15 LAB — HEPARIN LEVEL (UNFRACTIONATED)
Heparin Unfractionated: 0.49 IU/mL (ref 0.30–0.70)
Heparin Unfractionated: 0.49 IU/mL (ref 0.30–0.70)

## 2018-07-15 SURGERY — LOWER EXTREMITY ANGIOGRAPHY
Anesthesia: Moderate Sedation | Laterality: Right

## 2018-07-15 MED ORDER — HEPARIN SODIUM (PORCINE) 1000 UNIT/ML IJ SOLN
INTRAMUSCULAR | Status: AC
  Filled 2018-07-15: qty 1

## 2018-07-15 MED ORDER — SODIUM CHLORIDE 0.9 % IV SOLN
INTRAVENOUS | Status: DC
  Administered 2018-07-16: 01:00:00 via INTRAVENOUS

## 2018-07-15 MED ORDER — FENTANYL CITRATE (PF) 100 MCG/2ML IJ SOLN
INTRAMUSCULAR | Status: DC | PRN
  Administered 2018-07-15 (×2): 25 ug via INTRAVENOUS
  Administered 2018-07-15: 50 ug via INTRAVENOUS

## 2018-07-15 MED ORDER — FENTANYL CITRATE (PF) 100 MCG/2ML IJ SOLN
INTRAMUSCULAR | Status: AC
  Filled 2018-07-15: qty 2

## 2018-07-15 MED ORDER — HEPARIN (PORCINE) 25000 UT/250ML-% IV SOLN
1100.0000 [IU]/h | INTRAVENOUS | Status: DC
  Administered 2018-07-16 – 2018-07-17 (×2): 1050 [IU]/h via INTRAVENOUS
  Administered 2018-07-17: 1150 [IU]/h via INTRAVENOUS
  Administered 2018-07-19: 1250 [IU]/h via INTRAVENOUS
  Administered 2018-07-20 – 2018-07-24 (×5): 1100 [IU]/h via INTRAVENOUS
  Filled 2018-07-15 (×10): qty 250

## 2018-07-15 MED ORDER — LIDOCAINE HCL (PF) 1 % IJ SOLN
INTRAMUSCULAR | Status: AC
  Filled 2018-07-15: qty 30

## 2018-07-15 MED ORDER — IPRATROPIUM-ALBUTEROL 0.5-2.5 (3) MG/3ML IN SOLN
RESPIRATORY_TRACT | Status: AC
  Filled 2018-07-15: qty 3

## 2018-07-15 MED ORDER — MIDAZOLAM HCL 5 MG/5ML IJ SOLN
INTRAMUSCULAR | Status: AC
  Filled 2018-07-15: qty 5

## 2018-07-15 MED ORDER — VANCOMYCIN HCL IN DEXTROSE 1-5 GM/200ML-% IV SOLN
1000.0000 mg | INTRAVENOUS | Status: DC
  Administered 2018-07-15 – 2018-07-17 (×3): 1000 mg via INTRAVENOUS
  Filled 2018-07-15 (×6): qty 200

## 2018-07-15 MED ORDER — IOPAMIDOL (ISOVUE-300) INJECTION 61%
INTRAVENOUS | Status: DC | PRN
  Administered 2018-07-15: 145 mL via INTRA_ARTERIAL

## 2018-07-15 MED ORDER — HEPARIN SODIUM (PORCINE) 1000 UNIT/ML IJ SOLN
INTRAMUSCULAR | Status: DC | PRN
  Administered 2018-07-15: 5000 [IU] via INTRAVENOUS

## 2018-07-15 MED ORDER — MIDAZOLAM HCL 2 MG/2ML IJ SOLN
INTRAMUSCULAR | Status: DC | PRN
  Administered 2018-07-15: 1 mg via INTRAVENOUS
  Administered 2018-07-15 (×2): 0.5 mg via INTRAVENOUS

## 2018-07-15 MED ORDER — IPRATROPIUM-ALBUTEROL 0.5-2.5 (3) MG/3ML IN SOLN: 3.0000 mL | Freq: Once | RESPIRATORY_TRACT | Status: DC

## 2018-07-15 MED ORDER — HEPARIN (PORCINE) IN NACL 1000-0.9 UT/500ML-% IV SOLN
INTRAVENOUS | Status: AC
  Filled 2018-07-15: qty 1000

## 2018-07-15 SURGICAL SUPPLY — 29 items
BALLN LUTONIX 018 4X220X130 (BALLOONS) ×3
BALLN LUTONIX 018 4X80X130 (BALLOONS) ×3
BALLN LUTONIX 018 5X220X130 (BALLOONS) ×3
BALLN ULTRASCORE 014 4X200X150 (BALLOONS) ×3
BALLN ULTRV 018 5X40X75 (BALLOONS) ×3
BALLOON LUTONIX 018 4X220X130 (BALLOONS) IMPLANT
BALLOON LUTONIX 018 4X80X130 (BALLOONS) IMPLANT
BALLOON LUTONIX 018 5X220X130 (BALLOONS) IMPLANT
BALLOON ULTRSCRE 014 4X200X150 (BALLOONS) IMPLANT
BALLOON ULTRV 018 5X40X75 (BALLOONS) IMPLANT
CATH BEACON 5 .035 40 KMP TP (CATHETERS) IMPLANT
CATH BEACON 5 .038 40 KMP TP (CATHETERS) ×2
CATH PIG 70CM (CATHETERS) ×2 IMPLANT
DEVICE CLOSURE MYNXGRIP 6/7F (Vascular Products) ×2 IMPLANT
DEVICE PRESTO INFLATION (MISCELLANEOUS) ×2 IMPLANT
DEVICE TORQUE .025-.038 (MISCELLANEOUS) ×2 IMPLANT
GLIDESHEATH SLEND A-KIT 6F 22G (SHEATH) ×2 IMPLANT
GLIDEWIRE STIFF .35X180X3 HYDR (WIRE) ×2 IMPLANT
GUIDEWIRE ADV .018X180CM (WIRE) ×2 IMPLANT
NDL ENTRY 21GA 7CM ECHOTIP (NEEDLE) IMPLANT
NEEDLE ENTRY 21GA 7CM ECHOTIP (NEEDLE) ×3 IMPLANT
PACK ANGIOGRAPHY (CUSTOM PROCEDURE TRAY) ×3 IMPLANT
SET INTRO CAPELLA COAXIAL (SET/KITS/TRAYS/PACK) ×2 IMPLANT
SHEATH BRITE TIP 5FRX11 (SHEATH) ×2 IMPLANT
SHEATH BRITE TIP 6FRX11 (SHEATH) ×2 IMPLANT
STENT LIFESTENT 5F 5X170X135 (Permanent Stent) ×2 IMPLANT
STENT VIABAHN 6X50X120 (Permanent Stent) ×2 IMPLANT
WIRE J 3MM .035X145CM (WIRE) ×2 IMPLANT
WIRE SPARTACORE .014X300CM (WIRE) ×2 IMPLANT

## 2018-07-15 NOTE — Progress Notes (Signed)
I spoke with the patient and her family to discuss the results of the angiogram and intervention.  As noted in the operative report the patient has a severe left common femoral lesion that is best treated with surgical endarterectomy.  At that time angioplasty of the left mid to distal SFA as well as the popliteal can be performed to complete the revascularization of the left lower extremity.  Should we pursue this course then consideration for treatment of the right profunda would also be made.  The results of the right lower extremity revascularization were discussed including the critical stenosis of the profunda and the final positioning of the proximal stent.  At the present time her right leg is revascularized and now has which should be normal distal perfusion adequate for wound healing.  Possibly femoral endarterectomies could be performed Thursday afternoon.

## 2018-07-15 NOTE — Progress Notes (Signed)
Pharmacy Antibiotic Note  April [name] is a 63 y.o. female admitted on 07/09/2018 with Wound infection.  Pharmacy has been consulted for vancomycin dosing. During an admission here in November she received vancomycin for the same indication and her regimen was 1 gram every 24 hours resulting in a Vt of 12 mcg/mL. Vancomycin dose was started at a dose of 1000 mg every 24 hours (1st dose was 750mg ). A vancomycin peak level was drawn at steady-state and a random level the next morning were used to create a pharmacokinetic profile.  Plan: Based on current recommendations to use AUC goals of 400-550 We will decrease vancomycin to 1000 mg IV every 24 hours  Predicted AUC 546, Vd 26.4, Ke 0.036, t1/2 22h, Css 32.0/15.4 mcg/mL   Height: 5\' 2"  (157.5 cm) Weight: 105 lb 4.8 oz (47.8 kg) IBW/kg (Calculated) : 50.1  Temp (24hrs), Avg:98.2 F (36.8 C), Min:98 F (36.7 C), Max:98.5 F (36.9 C)  Recent Labs  Lab 07/10/18 0056 07/10/18 1337 07/11/18 0547 07/12/18 0433 07/13/18 0159 07/13/18 2056 07/14/18 0506 07/14/18 1432 07/14/18 1656 07/15/18 0609  WBC 7.8  --  7.4 8.1 8.1  --  8.2  --   --   --   CREATININE 1.04*  --  1.04* 0.99  --   --   --  0.82  --  0.68  VANCOTROUGH  --   --   --   --   --   --   --   --  17  --   VANCOPEAK  --   --   --   --   --  32  --   --   --   --   VANCORANDOM  --  7  --   --   --   --   --  17  --   --     Estimated Creatinine Clearance: 54.3 mL/min (by C-G formula based on SCr of 0.68 mg/dL).    Antimicrobials this admission: 1/8 vancomcyin >>  1/8 pip/tazo >> 1/12  Dose adjustments this admission: 1/9 Vancomycin 750mg  every 36 hours-----> 1000mg  q24h 1/13 vancomycin 1000mg  q24h----> 1250mg  q24h (dose not administered) 1/14 vancomycin 1250mg  q24h----> 1000mg  q24h  Microbiology results: 1/8 Heel Cx: MRSA  Thank you for allowing pharmacy to be a part of this patient's care.  Lowella Bandy, PharmD Clinical Pharmacist  07/15/2018 12:23 PM

## 2018-07-15 NOTE — Progress Notes (Signed)
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  Allergies  Allergen Reactions  . Eggs Or Egg-Derived Products Diarrhea, Itching and Nausea And Vomiting    Patient Measurements: Height: 5\' 2"  (157.5 cm) Weight: 105 lb 6.1 oz (47.8 kg) IBW/kg (Calculated) : 50.1 Heparin Dosing Weight: 36.6 kg - reported by Jacqulyn Cane RN  Vital Signs: Temp: 98.2 F (36.8 C) (01/14 1241) Temp Source: Oral (01/14 1241) BP: 124/69 (01/14 1645) Pulse Rate: 107 (01/14 1645)  Labs: Recent Labs    07/12/18 1945 07/13/18 0159 07/14/18 0506 07/14/18 1432 07/14/18 2055 07/15/18 0609 07/15/18 1218  HGB  --  8.3* 8.0*  --   --   --   --   HCT  --  27.9* 26.3*  --   --   --   --   PLT  --  187 193  --   --   --   --   APTT 76*  --   --   --   --   --   --   HEPARINUNFRC 0.41 0.39 0.24* 0.34 0.17* 0.49 0.49  CREATININE  --   --   --  0.82  --  0.68  --     Estimated Creatinine Clearance: 54.3 mL/min (by C-G formula based on SCr of 0.68 mg/dL).   Assessment: Pharmacy was consulted for heparin.  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 Will follow aPTT.   Goal of Therapy:  APTT level 66-102 seconds or Heparin level 0.3-0.7 units/ml once heparin level correlates with aPTT  Monitor platelets by anticoagulation protocol: Yes  Heparin Course: 1/13 AM heparin level 0.24. 600 unit  bolus and increase rate t o 850 units/hr. Recheck in 6 hours.  1/13:  HL @ 2055 = 0.17 .   Heparin was not held during any point on 1/13, angiogram was not done.   Will order heparin 1750 units IV X 1 once and increase drip rate to 1050 units/hr.  Will recheck HL 6 hrs after rate change.  1/14 Patient had angiogram with heparin on hold.  Plan:  Patient had angiogram completed today.  Per new consult via Dr. Gilda Crease heparin to be restarted at 1800 without bolus at  previous rate.  Subsequent dosing per nomogram. Will order heparin 1050 units/hr starting at 1800.  Will draw heparin level every 6 hours until two consecutive therapeutic levels then daily and CBC daily while on heparin. Continue to monitor H&H and platelets   Orinda Kenner, PharmD Clinical Pharmacist 07/15/2018,4:48 PM

## 2018-07-15 NOTE — Progress Notes (Signed)
Per patient, her daughter brought in medication from home and treated her head lice  twice. At this time, I have not seen any parasites in pt. hair/head.

## 2018-07-15 NOTE — Progress Notes (Signed)
PT Cancellation Note  Patient Details Name: CAMY HASLIP MRN: [id number] DOB: May 19, 1956   Cancelled Treatment:    Reason Eval/Treat Not Completed: Patient at procedure or test/unavailable.  Pt currently off floor at procedure.  Will re-attempt PT evaluation at a later date/time.  Hendricks Limes, PT 07/15/18, 1:11 PM [phone]

## 2018-07-15 NOTE — Op Note (Signed)
[facility] Percutaneous Study/Intervention Procedural Note   Date of Surgery: 07/15/2018  Surgeon:  Katha Cabal, MD.  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 Introduction catheter into right lower extremity 3rd order catheter placement antegrade approach 2. Introduction catheter into aorta left common femoral retrograde approach   3. Percutaneous transluminal angioplasty and stent placement right superficial femoral artery  4. Minx closure right common femoral arteriotomy  Anesthesia: Conscious sedation was administered under my direct supervision by the interventional radiology RN. IV Versed plus fentanyl were utilized. Continuous ECG, pulse oximetry and blood pressure was monitored throughout the entire procedure.  Conscious sedation was for a total of 141 minutes.  Sheath: 6 French sheath antegrade right common femoral; 5 French sheath retrograde left common femoral  Contrast: 145 cc  Fluoroscopy Time: 24.5 minutes  Indications: Darin Engels presents with ulceration and active infection of the feet right significantly worse than the left.  She recently underwent intervention but this was to reconstruct her aortic bifurcation and common iliac arteries and did not address distal disease.  Given the magnitude of her tissue loss optimal distal perfusion is not imperative and therefore she is now being returned to the [facility] for further treatment.  The risks and benefits are reviewed all questions answered patient agrees to proceed.  Procedure: MARVINE ENCALADE is a 63 y.o. y.o. female who was identified and appropriate procedural time out was performed. The patient was then placed supine on the table and prepped and draped in the usual sterile fashion.    Ultrasound was placed in the sterile sleeve and the left groin was evaluated the left common femoral artery was echolucent and pulsatile indicating patency.  Image was recorded for the permanent record and under real-time visualization a microneedle was inserted into the common femoral artery microwire followed by a micro-sheath.  A J-wire was then advanced through the micro-sheath and a  5 [place] sheath was then inserted over a J-wire. J-wire was then advanced and a 5 French pigtail catheter was positioned at the level of T12. AP projection of the aorta was then obtained. Pigtail catheter was repositioned to above the bifurcation and bilateral oblique views of the pelvis were obtained.  Subsequently the detector was returned to the AP position and using bolus injection of contrast bilateral lower extremity runoff was performed.  Interpretation: The aorta remains patent there are bilateral kissing stents noted reconstructing the aortic bifurcation both which are patent and this raises the aortic bifurcation by 3 cm and therefore negates the ability of crossing over from the left femoral to the right lower extremity.  As noted the stents are widely patent the distal common iliacs are widely patent bilaterally and the external iliac arteries are both patent bilaterally.  On the right the common femoral demonstrates a 60% stenosis in its midportion the profunda femoris demonstrates a greater than 90% stenosis at its origin and the SFA demonstrates diffuse greater than 80% disease throughout its proximal two thirds.  At [facility] canal the above-knee popliteal is patent and although small it does not appear to have hemodynamically significant stenosis.  Trifurcation is patent and there is two-vessel runoff to the foot via the anterior tibial and posterior tibial.  Posterior tibial is the dominant vessel filling the medial and lateral plantar and the pedal arch.  On the left the common femoral demonstrates a  greater than 80% stenosis just below  the level of the sheath.  Profunda femoris is patent.  There is a greater than 60% stenosis at the origin of the SFA and the previously placed stents are patent but there is diffuse disease with multiple areas of greater than 70% stenosis.  Trifurcation appears patent but is quite faint and there is poor visualization from the mid calf to the foot based on this aortic injection.  Based on these findings, 5000 units of heparin was then given and allowed to circulate.  Ultrasound was then reprepped and draped in a sterile fashion.  The right common femoral was identified.  The 60% stenosis in the midportion was identified as was the circumflex vessels.  Access to the common femoral was obtained with a micropuncture needle under direct ultrasound visualization.  Image was recorded for the permanent record.  Access site was just distal to the circumflex branches.  This did allow adequate room to treat the mid common femoral lesion.  Once the micro-sheath had been placed wire was negotiated into the SFA and the micro sheath was exchanged for a 6 [place] sheath.  0.014 [place] core wire was then advanced down the SFA and into the peroneal artery.  Initially a 4 mm x 20 cm ultra score balloon was advanced beginning in the common femoral to separate inflations were made each inflation was to 14 atm for 1 minute.  Following this a 5 mm x 22 cm Lutonix drug-eluting balloon was advanced beginning in the mid common femoral and extending down into the SFA.  A second Lutonix balloon 4 x 15 was used more distally.  Follow-up imaging demonstrated the common femoral lesion was now 10 to 15% residual quite acceptable however there were 3 locations along the SFA and above-knee popliteal that still demonstrated greater than 50% residual stenosis.  In order for a more precise deployment I elected to use a 6 mm x 50 mm via bond proximally.  It was deployed so that the proximal edge was right at the  origin of the SFA.  Proximal edge still seemed somewhat constrained and so I elected to dilate the stent with a 5 mm x 40 mm ultra versed balloon.  2 inflations were used both to 10 atm for approximately 30 seconds.  Follow-up imaging now demonstrated the stent had migrated more proximally and was covering the profunda eliminating the possibility for intervention of the critical lesion.  Given this finding elected to continue with revascularization via the femoral-popliteal system so that she would have direct inline flow to the foot I selected a 6 mm x 170 mm life stent.  Distally the stent was postdilated with a 4 mm Lutonix balloon.  The angioplasty was site was also extended slightly into the popliteal.  More proximally a 5 mm Lutonix balloon was utilized.  Follow-up imaging by hand-injection now demonstrated that the SFA was widely patent with less than 5% residual stenosis throughout its course.  Distally the trifurcation was preserved and there were two-vessel runoff to the foot via the anterior tibial and posterior tibial.  After review of these images the sheath is pulled into the right common femoral artery oblique of the common femoral is obtained and a minx device deployed. There no immediate complications.  On the left magnified imaging confirmed the finding on the initial images the plaque within the common femoral does not allow for closure device and the sheath was pulled and pressure was held.   Findings:   The aorta remains patent there are bilateral  kissing stents noted reconstructing the aortic bifurcation both which are patent and this raises the aortic bifurcation by 3 cm and therefore negates the ability of crossing over from the left femoral to the right lower extremity.  As noted the stents are widely patent the distal common iliacs are widely patent bilaterally and the external iliac arteries are both patent bilaterally.  On the right the common femoral demonstrates a 60% stenosis  in its midportion the profunda femoris demonstrates a greater than 90% stenosis at its origin and the SFA demonstrates diffuse greater than 80% disease throughout its proximal two thirds.  At [facility] canal the above-knee popliteal is patent and although small it does not appear to have hemodynamically significant stenosis.  Trifurcation is patent and there is two-vessel runoff to the foot via the anterior tibial and posterior tibial.  Posterior tibial is the dominant vessel filling the medial and lateral plantar and the pedal arch.  On the left the common femoral demonstrates a greater than 80% stenosis just below the level of the sheath.  Profunda femoris is patent.  There is a greater than 60% stenosis at the origin of the SFA and the previously placed stents are patent but there is diffuse disease with multiple areas of greater than 70% stenosis.  Trifurcation appears patent but is quite faint and there is poor visualization from the mid calf to the foot based on this aortic injection.  Following angioplasty of the SFA and popliteal there is significant residual stenosis as described above and therefore stents are deployed.  Proximally a via bond stent was used and although initial placement was below the ostia of the profunda following postdilatation it clearly has expanded and cover the origin of the profunda.  Throughout the SFA and popliteal there is less than 5% residual stenosis.  Distal runoff is preserved.  On the left there appears to be a large plaque burden with hemodynamic significance in the common femoral.  There is also hemodynamically significant lesions within the SFA and popliteal.  Distal runoff was poorly visualized from aortic injection as noted above.    Summary: Successful recanalization right lower extremity for limb salvage and wound healing.  The patient now has direct inline flow from the aorta to the foot on the right.  Left lower extremity will likely need surgical  intervention given the severe common femoral lesion.  Completion angioplasty and stenting of the SFA and popliteal could be undertaken at that time.  Also, if she does require surgery the right groin could be addressed surgically so that the profunda femoris could be recruited back into circulation for future long-term durability    Disposition: Patient was taken to the recovery room in stable condition having tolerated the procedure well.  Loveah Like, Dolores Lory 07/15/2018,4:20 PM

## 2018-07-15 NOTE — H&P (Signed)
[facility] History & Physical Update  The patient was interviewed and re-examined.  The patient's previous History and Physical has been reviewed and is unchanged.  There is no change in the plan of care. We plan to proceed with the scheduled procedure.  Levora Dredge, MD  07/15/2018, 1:30 PM

## 2018-07-15 NOTE — Progress Notes (Signed)
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  Patient Measurements: Height: 5\' 2"  (157.5 cm) Weight: 105 lb 4.8 oz (47.8 kg) IBW/kg (Calculated) : 50.1  Vital Signs: Temp: 98.2 F (36.8 C) (01/14 0545) Temp Source: Oral (01/14 0545) BP: 101/58 (01/14 0545) Pulse Rate: 88 (01/14 0545)  Labs: Recent Labs    07/12/18 1326 07/12/18 1945 07/13/18 0159 07/14/18 0506 07/14/18 1432 07/14/18 2055 07/15/18 0609  HGB  --   --  8.3* 8.0*  --   --   --   HCT  --   --  27.9* 26.3*  --   --   --   PLT  --   --  187 193  --   --   --   APTT 53* 76*  --   --   --   --   --   HEPARINUNFRC 0.39 0.41 0.39 0.24* 0.34 0.17*  --   CREATININE  --   --   --   --  0.82  --  0.68    Estimated Creatinine Clearance: 54.3 mL/min (by C-G formula based on SCr of 0.68 mg/dL).   Assessment: Pharmacy was consulted for heparin.  Pt has recurrent dvt LLE, on xarelto at home, holding for vascular surgery: right lower extremity angiogram Tuesday and left leg angiogram Thursday. Hgb is lower than on admission but seems to have stabilized  Goal of Therapy:  Heparin level 0.3-0.7 units/ml   Monitor platelets by anticoagulation protocol: Yes  Heparin Course: 1/8 PM initiation: 1800 unit bolus, then 550 units/hr 1/9 0056 aPTT 84s 1/9 0902 aPTT 84s 1/10 AM aPTT 55, HL 0.44. Increase to 650 units/hr 1/11 1326 aPTT 53, HL 0.39. Increase to 750 units/hr 1/11 1945 aPTT/HL therapeutic x 1 1/12 AM heparin level 0.39. Continue current regimen 1/13 AM heparin level 0.24. 600 unit  bolus and inc rate to 850 units/hr 1/13:  HL 0.17 1750 units IV X 1 once and inc rate to 1050 units/hr 1/14 0609 HL 0.49 1/14 ~1200-1800 heparin held for vasc procedure 1/14 2356 HL 0.39 1/15 0330 HL 0.36 1/16 0530 HL 0.21: rate inc to 1150 unit/hr 1/16 1123 HL < 0.10   Plan:  Level has returned  subtherapeutic while the patient is undergoing an angiogram in the vascular cath lab. Heparin has been stopped for the procedure. If heparin is continued after the procedure pharmacy will evaluate dose att that time. Pharmacy will continue to monitor H&H and platelets while patient is receiving heparin.  Lowella Bandy, PharmD 07/15/2018,7:17 AM

## 2018-07-16 ENCOUNTER — Inpatient Hospital Stay: Payer: Medicare HMO

## 2018-07-16 ENCOUNTER — Encounter
Admission: AD | Disposition: A | Discharge status: Skilled Nursing Facility | Payer: Self-pay | Source: Ambulatory Visit | Attending: Internal Medicine

## 2018-07-16 DIAGNOSIS — L97919 Non-pressure chronic ulcer of unspecified part of right lower leg with unspecified severity: Secondary | ICD-10-CM

## 2018-07-16 DIAGNOSIS — I70239 Atherosclerosis of native arteries of right leg with ulceration of unspecified site: Secondary | ICD-10-CM

## 2018-07-16 DIAGNOSIS — R000 Tachycardia, unspecified: Secondary | ICD-10-CM

## 2018-07-16 DIAGNOSIS — L97929 Non-pressure chronic ulcer of unspecified part of left lower leg with unspecified severity: Secondary | ICD-10-CM

## 2018-07-16 DIAGNOSIS — I70249 Atherosclerosis of native arteries of left leg with ulceration of unspecified site: Secondary | ICD-10-CM

## 2018-07-16 LAB — MAGNESIUM: Magnesium: 1.9 mg/dL (ref 1.7–2.4)

## 2018-07-16 LAB — AEROBIC/ANAEROBIC CULTURE W GRAM STAIN (SURGICAL/DEEP WOUND): Gram Stain: NONE SEEN

## 2018-07-16 LAB — CBC WITH DIFFERENTIAL/PLATELET
ABS IMMATURE GRANULOCYTES: 0.07 10*3/uL (ref 0.00–0.07)
Basophils Absolute: 0 10*3/uL (ref 0.0–0.1)
Basophils Relative: 0 %
Eosinophils Absolute: 0 10*3/uL (ref 0.0–0.5)
Eosinophils Relative: 0 %
HCT: 24.7 % — ABNORMAL LOW (ref 36.0–46.0)
Hemoglobin: 7.4 g/dL — ABNORMAL LOW (ref 12.0–15.0)
Immature Granulocytes: 1 %
Lymphocytes Relative: 9 %
Lymphs Abs: 1.2 10*3/uL (ref 0.7–4.0)
MCH: 28.6 pg (ref 26.0–34.0)
MCHC: 30 g/dL (ref 30.0–36.0)
MCV: 95.4 fL (ref 80.0–100.0)
MONO ABS: 1.1 10*3/uL — AB (ref 0.1–1.0)
Monocytes Relative: 9 %
Neutro Abs: 10.6 10*3/uL — ABNORMAL HIGH (ref 1.7–7.7)
Neutrophils Relative %: 81 %
Platelets: 161 10*3/uL (ref 150–400)
RBC: 2.59 MIL/uL — ABNORMAL LOW (ref 3.87–5.11)
RDW: 14.9 % (ref 11.5–15.5)
WBC: 13 10*3/uL — ABNORMAL HIGH (ref 4.0–10.5)
nRBC: 0 % (ref 0.0–0.2)

## 2018-07-16 LAB — BASIC METABOLIC PANEL
Anion gap: 5 (ref 5–15)
Anion gap: 8 (ref 5–15)
BUN: 23 mg/dL (ref 8–23)
BUN: 23 mg/dL (ref 8–23)
CALCIUM: 8 mg/dL — AB (ref 8.9–10.3)
CO2: 27 mmol/L (ref 22–32)
CO2: 26 mmol/L (ref 22–32)
Calcium: 8.1 mg/dL — ABNORMAL LOW (ref 8.9–10.3)
Chloride: 104 mmol/L (ref 98–111)
Chloride: 101 mmol/L (ref 98–111)
Creatinine, Ser: 0.66 mg/dL (ref 0.44–1.00)
Creatinine, Ser: 0.76 mg/dL (ref 0.44–1.00)
GFR calc Af Amer: >60 mL/min (ref >60)
GFR calc Af Amer: >60 mL/min (ref >60)
GFR calc non Af Amer: >60 mL/min (ref >60)
GFR calc non Af Amer: >60 mL/min (ref >60)
Glucose, Bld: 226 mg/dL — ABNORMAL HIGH (ref 70–99)
Glucose, Bld: 152 mg/dL — ABNORMAL HIGH (ref 70–99)
Potassium: 4.4 mmol/L (ref 3.5–5.1)
Potassium: 4.9 mmol/L (ref 3.5–5.1)
Sodium: 136 mmol/L (ref 135–145)
Sodium: 135 mmol/L (ref 135–145)

## 2018-07-16 LAB — GLUCOSE, CAPILLARY
Glucose-Capillary: 190 mg/dL — ABNORMAL HIGH (ref 70–99)
Glucose-Capillary: 211 mg/dL — ABNORMAL HIGH (ref 70–99)
Glucose-Capillary: 146 mg/dL — ABNORMAL HIGH (ref 70–99)
Glucose-Capillary: 95 mg/dL (ref 70–99)
Glucose-Capillary: 107 mg/dL — ABNORMAL HIGH (ref 70–99)
Glucose-Capillary: 299 mg/dL — ABNORMAL HIGH (ref 70–99)

## 2018-07-16 LAB — HEPARIN LEVEL (UNFRACTIONATED)
Heparin Unfractionated: 0.36 IU/mL (ref 0.30–0.70)
Heparin Unfractionated: 0.39 IU/mL (ref 0.30–0.70)

## 2018-07-16 LAB — RPR: RPR Ser Ql: NONREACTIVE

## 2018-07-16 LAB — TROPONIN I
Troponin I: <0.03 ng/mL (ref <0.03)
Troponin I: 0.11 ng/mL (ref <0.03)

## 2018-07-16 SURGERY — LOWER EXTREMITY ANGIOGRAPHY
Anesthesia: Moderate Sedation | Laterality: Left

## 2018-07-16 SURGERY — LOWER EXTREMITY ANGIOGRAPHY
Anesthesia: Moderate Sedation | Laterality: Right

## 2018-07-16 MED ORDER — IPRATROPIUM-ALBUTEROL 0.5-2.5 (3) MG/3ML IN SOLN
RESPIRATORY_TRACT | Status: AC
  Filled 2018-07-16: qty 3

## 2018-07-16 MED ORDER — SODIUM CHLORIDE 0.9 % IV SOLN: INTRAVENOUS | Status: DC

## 2018-07-16 MED ORDER — METHYLPREDNISOLONE SODIUM SUCC 125 MG IJ SOLR: 125.0000 mg | INTRAMUSCULAR | Status: DC | PRN

## 2018-07-16 MED ORDER — ONDANSETRON HCL 4 MG/2ML IJ SOLN: 4.0000 mg | Freq: Four times a day (QID) | INTRAMUSCULAR | Status: DC | PRN

## 2018-07-16 MED ORDER — CEFAZOLIN SODIUM-DEXTROSE 2-4 GM/100ML-% IV SOLN
2.0000 g | INTRAVENOUS | Status: AC
  Administered 2018-07-17: 2 g via INTRAVENOUS
  Filled 2018-07-16: qty 100

## 2018-07-16 MED ORDER — MIDODRINE HCL 5 MG PO TABS
10.0000 mg | ORAL_TABLET | Freq: Three times a day (TID) | ORAL | Status: DC
  Administered 2018-07-17 – 2018-07-26 (×23): 10 mg via ORAL
  Filled 2018-07-16 (×32): qty 2

## 2018-07-16 MED ORDER — FAMOTIDINE 20 MG PO TABS: 40.0000 mg | ORAL_TABLET | ORAL | Status: DC | PRN

## 2018-07-16 MED ORDER — DIPHENHYDRAMINE HCL 50 MG/ML IJ SOLN: 50.0000 mg | Freq: Once | INTRAMUSCULAR | Status: DC

## 2018-07-16 MED ORDER — METOPROLOL TARTRATE 5 MG/5ML IV SOLN: 2.5000 mg | Freq: Four times a day (QID) | INTRAVENOUS | Status: DC

## 2018-07-16 MED ORDER — METOPROLOL TARTRATE 5 MG/5ML IV SOLN
INTRAVENOUS | Status: AC
  Administered 2018-07-16: 5 mg via INTRAVENOUS
  Filled 2018-07-16: qty 5

## 2018-07-16 MED ORDER — METOPROLOL TARTRATE 5 MG/5ML IV SOLN
5.0000 mg | Freq: Once | INTRAVENOUS | Status: AC
  Administered 2018-07-16: 5 mg via INTRAVENOUS

## 2018-07-16 MED ORDER — METOPROLOL TARTRATE 5 MG/5ML IV SOLN
5.0000 mg | Freq: Four times a day (QID) | INTRAVENOUS | Status: DC
  Administered 2018-07-16 – 2018-07-24 (×17): 5 mg via INTRAVENOUS
  Filled 2018-07-16 (×26): qty 5

## 2018-07-16 MED ORDER — HYDROMORPHONE HCL 1 MG/ML IJ SOLN
1.0000 mg | Freq: Once | INTRAMUSCULAR | Status: AC | PRN
  Administered 2018-07-17: 1 mg via INTRAVENOUS
  Filled 2018-07-16: qty 1

## 2018-07-16 MED ORDER — PROCHLORPERAZINE EDISYLATE 10 MG/2ML IJ SOLN
5.0000 mg | Freq: Four times a day (QID) | INTRAMUSCULAR | Status: DC | PRN
  Filled 2018-07-16: qty 1

## 2018-07-16 NOTE — Progress Notes (Signed)
[facility] - [place] at [facility]   PATIENT NAME: April [name]    MR#:  [id number]  DATE OF BIRTH:  [birth year]  SUBJECTIVE:  CHIEF COMPLAINT: Patient is denying any complaints at this point  REVIEW OF SYSTEMS:  CONSTITUTIONAL: No fever, fatigue or weakness.  EYES: No blurred or double vision.  EARS, NOSE, AND THROAT: No tinnitus or ear pain.  RESPIRATORY: No cough, shortness of breath, wheezing or hemoptysis.  CARDIOVASCULAR: No chest pain, orthopnea, edema.  GASTROINTESTINAL: No nausea, vomiting, diarrhea or abdominal pain.  GENITOURINARY: No dysuria, hematuria.  ENDOCRINE: No polyuria, nocturia,  HEMATOLOGY: No anemia, easy bruising or bleeding SKIN: Right foot in dressing and has left heel ulcer MUSCULOSKELETAL: No joint pain or arthritis.   NEUROLOGIC: No tingling, numbness, weakness.  PSYCHIATRY: No anxiety or depression.   DRUG ALLERGIES:   Allergies  Allergen Reactions  . Eggs Or Egg-Derived Products Diarrhea, Itching and Nausea And Vomiting    VITALS:  Blood pressure 101/71, pulse (!) 134, temperature 98.4 F (36.9 C), temperature source Oral, resp. rate 16, height 5\' 2"  (1.575 m), weight 47.8 kg, SpO2 95 %.  PHYSICAL EXAMINATION:  GENERAL:  63 y.o.-year-old patient lying in the bed with no acute distress.  EYES: Pupils equal, round, reactive to light and accommodation. No scleral icterus. Extraocular muscles intact.  HEENT: Head atraumatic, normocephalic. Oropharynx and nasopharynx clear.  NECK:  Supple, no jugular venous distention. No thyroid enlargement, no tenderness.  LUNGS: Normal breath sounds bilaterally, no wheezing, rales,rhonchi or crepitation. No use of accessory muscles of respiration.  CARDIOVASCULAR: S1, S2 normal. No murmurs, rubs, or gallops.  ABDOMEN: Soft, nontender, nondistended. Bowel sounds present.  EXTREMITIES: No pedal edema, cyanosis, or clubbing.  NEUROLOGIC: Awake, alert and oriented x3 sensation intact.  Gait not checked.  PSYCHIATRIC: The patient is alert and oriented x 3.  SKIN: Right foot with wound VAC left heel ulcer with necrosis  LABORATORY PANEL:   CBC Recent Labs  Lab 07/14/18 0506  WBC 8.2  HGB 8.0*  HCT 26.3*  PLT 193   ------------------------------------------------------------------------------------------------------------------  Chemistries  Recent Labs  Lab 07/15/18 0609 07/16/18 0331  NA 138 136  K 4.0 4.4  CL 102 104  CO2 29 27  GLUCOSE 167* 226*  BUN 28* 23  CREATININE 0.68 0.66  CALCIUM 8.4* 8.0*  MG 2.2  --    ------------------------------------------------------------------------------------------------------------------  Cardiac Enzymes Recent Labs  Lab 07/16/18 1439  TROPONINI <0.03   ------------------------------------------------------------------------------------------------------------------  RADIOLOGY:  Dg Chest Port 1 View  Result Date: 07/16/2018 CLINICAL DATA:  Shortness of breath, coughing up yellow sputum, history COPD, diabetes mellitus, smoker EXAM: PORTABLE CHEST 1 VIEW COMPARISON:  Portable exam 1416 hours compared to 06/11/2018 FINDINGS: Normal heart size and mediastinal contours. Question mild pulmonary vascular congestion. Atherosclerotic calcification aorta. Diffuse interstitial prominence and scattered Kerley B-lines at the mid to lower lungs question pulmonary edema. Decreased bibasilar effusions and atelectasis versus previous exam. Question subtle developing infiltrate RIGHT upper lobe. No pneumothorax. Bones demineralized. IMPRESSION: Question subtle RIGHT upper lobe infiltrate. Improved bibasilar pleural effusions and atelectasis. Persistent interstitial prominence in mid to lower lungs suggesting minimal pulmonary edema. Electronically Signed   By: Ulyses Southward M.D.   On: 07/16/2018 14:39    EKG:   Orders placed or performed during the hospital encounter of 07/09/18  . EKG 12-Lead  . 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 -Continue heparin  #4 COPD-stable, no signs of acute exacerbation -Continue home inhalers  Uncontrolled type 2 diabetes- recent A1c 10.9. -Continue Lantus will need adjustment  Hyperlipidemia-stable -Continue home statin  Anxiety-stable -Continue home Xanax  Severe protein-calorie malnutrition -Continue home Premier protein twice daily between meals  Head lice- permethrin  Tobacco abuse -Nicotine patch as needed  All the records are reviewed and case discussed with Care Management/Social Workerr. Management plans discussed with the patient, daughter at bedside and they are in agreement  CODE STATUS: fc  TOTAL TIME TAKING CARE OF THIS PATIENT: 33 minutes.   POSSIBLE D/C IN 2 DAYS, DEPENDING ON CLINICAL CONDITION.  Note: This dictation was prepared with Dragon dictation along with smaller phrase technology. Any transcriptional errors that result from this process are unintentional.   Auburn Bilberry M.D on 07/16/2018 at 3:57 PM  Between 7am to 6pm - Pager - [phone] After 6pm go to [email or web address] - password [email or web address] [facility]  [place]  [facility]  Office  [phone]  CC: Primary care physician; Mickel Fuchs, MD

## 2018-07-16 NOTE — Progress Notes (Signed)
Dr. Elisabeth PigeonVachhani notified of elevated troponin and serial Troponins already  Ordered and hypotension; new order written to hold 0000 Lopressor; acknowledged; Windy Carinaurner,Estelle Skibicki K, RN 07/16/2018 11:00 PM

## 2018-07-16 NOTE — Progress Notes (Signed)
07/16/2018 16:30  Answered call to patient's room requestinga nurse as patient's assigned nurse was busy discharging another patient.  Found patient in the room on non-rebeather O2 complaining it was hard to breathe.  O2 sat over 90%, but pulse read at 142.  Auscultated pulse at 150bpm and called rapid response.  Respiratory therapist drew arterial blood gas.  Other vital signs and CBG were within defined limits.  Attending MD Ezequiel Kayser ordered EKG and 5mg  metoprolol IV.  After administration of metoprolol, heart rate dropped to 99bpm.  Patient stated she was feeling better and requested nasal cannula for O2 delivery.  Pt states "I feel alright."  Patient's assigned RN Army Fossa arrived to take over.  Will coninue to monitor and assess.  Bradly Chris, RN

## 2018-07-16 NOTE — Progress Notes (Signed)
PT Cancellation Note  Patient Details Name: April [name] MRN: [id number] DOB: 1955/07/28   Cancelled Treatment:    Reason Eval/Treat Not Completed: Patient at procedure or test/unavailable.  Pt currently off floor.  Will re-attempt PT evaluation at a later date/time.  Hendricks Limes, PT 07/16/18, 8:15 AM [phone]

## 2018-07-16 NOTE — Progress Notes (Signed)
Notified about elevated troponin. She had episode of RVR and Confusion earlier.  Patient is already on heparin drip for peripheral arterial disease. She does not have any complaints of chest pain as per the nurse. Her vitals are stable.  I have advised to continue monitoring serial troponin.  Her systolic blood pressure is around 90s but heart rate is stable now so we are going to hold her next dose of metoprolol at midnight.

## 2018-07-16 NOTE — Progress Notes (Signed)
Prepped pt. For LE angiogram/possible Peripheral intervention. Received orders that procedure cancelled for today and that pt. Will be done in the OR tomorrow. Confirmed with Dr. Wyn Quaker via phone. Pt. Given resp. Treatment and tolerated well while here in specials. No acute distress at present.

## 2018-07-16 NOTE — Progress Notes (Signed)
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  Allergies  Allergen Reactions  . Eggs Or Egg-Derived Products Diarrhea, Itching and Nausea And Vomiting    Patient Measurements: Height: 5\' 2"  (157.5 cm) Weight: 105 lb 6.1 oz (47.8 kg) IBW/kg (Calculated) : 50.1 Heparin Dosing Weight: 36.6 kg - reported by Jacqulyn Cane RN  Vital Signs: Temp: 98.4 F (36.9 C) (01/14 2143) Temp Source: Oral (01/14 2143) BP: 109/61 (01/14 2143) Pulse Rate: 97 (01/14 2143)  Labs: Recent Labs    07/13/18 0159 07/14/18 0506 07/14/18 1432  07/15/18 0609 07/15/18 1218 07/15/18 2356  HGB 8.3* 8.0*  --   --   --   --   --   HCT 27.9* 26.3*  --   --   --   --   --   PLT 187 193  --   --   --   --   --   HEPARINUNFRC 0.39 0.24* 0.34   < > 0.49 0.49 0.39  CREATININE  --   --  0.82  --  0.68  --   --    < > = values in this interval not displayed.    Estimated Creatinine Clearance: 54.3 mL/min (by C-G formula based on SCr of 0.68 mg/dL).   Assessment: Pharmacy was consulted for heparin.  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 Will follow aPTT.   Goal of Therapy:  APTT level 66-102 seconds or Heparin level 0.3-0.7 units/ml once heparin level correlates with aPTT  Monitor platelets by anticoagulation protocol: Yes  Heparin Course: 1/13 AM heparin level 0.24. 600 unit  bolus and increase rate t o 850 units/hr. Recheck in 6 hours.  1/13:  HL @ 2055 = 0.17 .   Heparin was not held during any point on 1/13, angiogram was not done.   Will order heparin 1750 units IV X 1 once and increase drip rate to 1050 units/hr.  Will recheck HL 6 hrs after rate change.  1/14 Patient had angiogram with heparin on hold.  Plan:  01/15 @ 2356 HL 0.39 therapeutic. Will continue current rate and will recheck w/ am labs.  Thomasene Ripple, PharmD Clinical  Pharmacist 07/16/2018,12:58 AM

## 2018-07-16 NOTE — Progress Notes (Signed)
Pastoral Care Rapid Response    07/16/18 1630  Clinical Encounter Type  Visited With Patient and family together  Visit Type Code;Spiritual support (Rapid Response)  Referral From Nurse  Consult/Referral To Chaplain   Chap responded to RR code.  Due to staff in room, comforted the family (significant other, 1 daughter,and 2 granddaughters).  The family dynamics are strained especially with regard to the pt's daughter. Chap listened to family story and provided empathetic ear.  Milinda Antis, [address]

## 2018-07-16 NOTE — Progress Notes (Signed)
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  Allergies  Allergen Reactions  . Eggs Or Egg-Derived Products Diarrhea, Itching and Nausea And Vomiting    Patient Measurements: Height: 5\' 2"  (157.5 cm) Weight: 105 lb 6.1 oz (47.8 kg) IBW/kg (Calculated) : 50.1 Heparin Dosing Weight: 36.6 kg - reported by Jacqulyn Cane RN  Vital Signs: Temp: 98.5 F (36.9 C) (01/15 0350) Temp Source: Oral (01/15 0350) BP: 115/64 (01/15 0350) Pulse Rate: 99 (01/15 0350)  Labs: Recent Labs    07/14/18 0506 07/14/18 1432  07/15/18 0609 07/15/18 1218 07/15/18 2356 07/16/18 0331  HGB 8.0*  --   --   --   --   --   --   HCT 26.3*  --   --   --   --   --   --   PLT 193  --   --   --   --   --   --   HEPARINUNFRC 0.24* 0.34   < > 0.49 0.49 0.39 0.36  CREATININE  --  0.82  --  0.68  --   --  0.66   < > = values in this interval not displayed.    Estimated Creatinine Clearance: 54.3 mL/min (by C-G formula based on SCr of 0.66 mg/dL).   Assessment: Pharmacy was consulted for heparin.  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 Will follow aPTT.   Goal of Therapy:  APTT level 66-102 seconds or Heparin level 0.3-0.7 units/ml once heparin level correlates with aPTT  Monitor platelets by anticoagulation protocol: Yes  Heparin Course: 1/13 AM heparin level 0.24. 600 unit  bolus and increase rate t o 850 units/hr. Recheck in 6 hours.  1/13:  HL @ 2055 = 0.17 .   Heparin was not held during any point on 1/13, angiogram was not done.   Will order heparin 1750 units IV X 1 once and increase drip rate to 1050 units/hr.  Will recheck HL 6 hrs after rate change.  1/14 Patient had angiogram with heparin on hold.  Plan:  01/15 @ 0330 HL 0.36 therapeutic. Will continue current rate and will recheck w/ am labs.  Thomasene Ripple, PharmD Clinical  Pharmacist 07/16/2018,4:35 AM

## 2018-07-16 NOTE — Progress Notes (Signed)
[employer] - [place] at [facility]                                                                                                                                                                                  Patient Demographics   April [name], is a 63 y.o. female, DOB - [birth year], ZOX:[id number]  Admit date - 07/09/2018   Admitting Physician Campbell StallKaty Dodd Mayo, MD  Outpatient Primary MD for the patient is Mickel FuchsWroth, Thomas H, MD   LOS - 7  Subjective: Rapid response was called patient was becoming more lethargic heart rate being very elevated    Review of Systems:   CONSTITUTIONAL:patient lethargic  Vitals:   Vitals:   07/16/18 1339 07/16/18 1540 07/16/18 1542 07/16/18 1621  BP: (!) 106/58 101/71  107/74  Pulse: (!) 137 (!) 138 (!) 134 (!) 131  Resp:      Temp: 98.4 F (36.9 C)     TempSrc: Oral     SpO2: 94% (!) 88% 95% 96%  Weight:      Height:        Wt Readings from Last 3 Encounters:  07/16/18 47.8 kg  06/13/18 49.6 kg  05/30/18 52.1 kg     Intake/Output Summary (Last 24 hours) at 07/16/2018 1652 Last data filed at 07/16/2018 [id number] Gross per 24 hour  Intake 1127.24 ml  Output 1100 ml  Net 27.24 ml    Physical Exam:   GENERAL: Critically ill appearing  HEAD, EYES, EARS, NOSE AND THROAT: Atraumatic, normocephalic. Extraocular muscles are intact. Pupils equal and reactive to light. Sclerae anicteric. No conjunctival injection. No oro-pharyngeal erythema.  NECK: Supple. There is no jugular venous distention. No bruits, no lymphadenopathy, no thyromegaly.  HEART: tachycardia , no murmer and gallops LUNGS: Clear to auscultation bilaterally. No rales or rhonchi. No wheezes.  ABDOMEN: Soft, flat, nontender, nondistended. Has good bowel sounds. No hepatosplenomegaly appreciated.  EXTREMITIES: No evidence of any cyanosis, clubbing, or peripheral edema.  +2 pedal and radial pulses bilaterally.  NEUROLOGIC: pt sleepy SKIN: Moist and warm with no rashes  appreciated.  Psych: Not anxious, depressed LN: No inguinal LN enlargement    Antibiotics   Anti-infectives (From admission, onward)   Start     Dose/Rate Route Frequency Ordered Stop   07/17/18 0500  ceFAZolin (ANCEF) IVPB 2g/100 mL premix    Note to Pharmacy:  Send with pt to OR   2 g 200 mL/hr over 30 Minutes Intravenous On call 07/16/18 1214 07/18/18 0500   07/15/18 1800  vancomycin (VANCOCIN) IVPB 1000 mg/200 mL premix     1,000 mg 200 mL/hr over 60 Minutes Intravenous Every 24 hours 07/15/18 1232  07/15/18 0500  ceFAZolin (ANCEF) IVPB 2g/100 mL premix    Note to Pharmacy:  Send with patient to specials   2 g 200 mL/hr over 30 Minutes Intravenous On call 07/14/18 1704 07/15/18 1425   07/14/18 2230  ceFAZolin (ANCEF) IVPB 2g/100 mL premix     2 g 200 mL/hr over 30 Minutes Intravenous  Once 07/14/18 2222 07/16/18 0206   07/14/18 1800  vancomycin (VANCOCIN) 1,250 mg in sodium chloride 0.9 % 250 mL IVPB  Status:  Discontinued     1,250 mg 166.7 mL/hr over 90 Minutes Intravenous Every 24 hours 07/14/18 1517 07/15/18 1232   07/11/18 1800  vancomycin (VANCOCIN) IVPB 1000 mg/200 mL premix  Status:  Discontinued     1,000 mg 200 mL/hr over 60 Minutes Intravenous Every 24 hours 07/11/18 1631 07/14/18 1517   07/11/18 1645  piperacillin-tazobactam (ZOSYN) IVPB 3.375 g  Status:  Discontinued     3.375 g 12.5 mL/hr over 240 Minutes Intravenous Every 8 hours 07/11/18 1631 07/13/18 1847   07/10/18 1600  vancomycin (VANCOCIN) IVPB 1000 mg/200 mL premix  Status:  Discontinued     1,000 mg 200 mL/hr over 60 Minutes Intravenous Every 24 hours 07/10/18 1507 07/11/18 1518   07/09/18 2000  piperacillin-tazobactam (ZOSYN) IVPB 3.375 g  Status:  Discontinued     3.375 g 12.5 mL/hr over 240 Minutes Intravenous Every 8 hours 07/09/18 1911 07/11/18 1518   07/09/18 2000  vancomycin (VANCOCIN) IVPB 750 mg/150 ml premix  Status:  Discontinued     750 mg 150 mL/hr over 60 Minutes Intravenous Every 36  hours 07/09/18 1911 07/10/18 1507      Medications   Scheduled Meds: . aspirin  81 mg Oral Daily  . atorvastatin  40 mg Oral q1800  . ferrous sulfate  325 mg Oral BID WC  . furosemide  40 mg Oral Daily  . insulin aspart  0-15 Units Subcutaneous TID WC  . insulin aspart  0-5 Units Subcutaneous QHS  . insulin aspart  3 Units Subcutaneous TID WC  . insulin glargine  10 Units Subcutaneous QHS  . ipratropium-albuterol  3 mL Nebulization Once  . ipratropium-albuterol      . metoprolol tartrate  2.5 mg Intravenous Q6H  . mometasone-formoterol  2 puff Inhalation BID  . multivitamin-lutein  1 capsule Oral Daily  . pantoprazole  40 mg Oral BID  . polyethylene glycol  17 g Oral Daily  . ENSURE MAX PROTEIN  11 oz Oral BID  . tamsulosin  0.4 mg Oral Daily  . tiotropium  18 mcg Inhalation Daily  . ascorbic acid  500 mg Oral BID   Continuous Infusions: . sodium chloride    . [START ON 07/17/2018]  ceFAZolin (ANCEF) IV    . heparin 1,050 Units/hr (07/16/18 5027)  . vancomycin Stopped (07/15/18 2355)   PRN Meds:.sodium chloride, acetaminophen, albuterol, ALPRAZolam, guaiFENesin-dextromethorphan, HYDROcodone-acetaminophen, HYDROmorphone (DILAUDID) injection, morphine injection, nicotine, ondansetron (ZOFRAN) IV, ondansetron (ZOFRAN) IV, polyethylene glycol, prochlorperazine   Data Review:   Micro Results Recent Results (from the past 240 hour(s))  Aerobic/Anaerobic Culture (surgical/deep wound)     Status: None   Collection Time: 07/09/18  2:31 PM  Result Value Ref Range Status   Specimen Description   Final    HEEL Performed at [facility], [address] [place], [place] [address]    Special Requests   Final    NONE Performed at [facility], [address], [place], [place] [address]    Gram  Stain   Final    MODERATE WBC PRESENT, PREDOMINANTLY PMN ABUNDANT GRAM POSITIVE COCCI    Culture   Final    MODERATE METHICILLIN RESISTANT STAPHYLOCOCCUS AUREUS NO  ANAEROBES ISOLATED Performed at [facility], [address], [place], [place] [address]    Report Status 07/14/2018 FINAL  Final   Organism ID, Bacteria METHICILLIN RESISTANT STAPHYLOCOCCUS AUREUS  Final      Susceptibility   Methicillin resistant staphylococcus aureus - MIC*    CIPROFLOXACIN >=8 RESISTANT Resistant     ERYTHROMYCIN >=8 RESISTANT Resistant     GENTAMICIN <=0.5 SENSITIVE Sensitive     OXACILLIN >=4 RESISTANT Resistant     TETRACYCLINE <=1 SENSITIVE Sensitive     VANCOMYCIN 1 SENSITIVE Sensitive     TRIMETH/SULFA <=10 SENSITIVE Sensitive     CLINDAMYCIN <=0.25 SENSITIVE Sensitive     RIFAMPIN <=0.5 SENSITIVE Sensitive     Inducible Clindamycin NEGATIVE Sensitive     * MODERATE METHICILLIN RESISTANT STAPHYLOCOCCUS AUREUS  MRSA PCR Screening     Status: Abnormal   Collection Time: 07/10/18  6:21 PM  Result Value Ref Range Status   MRSA by PCR POSITIVE (A) NEGATIVE Final    Comment:        The GeneXpert MRSA Assay (FDA approved for NASAL specimens only), is one component of a comprehensive MRSA colonization surveillance program. It is not intended to diagnose MRSA infection nor to guide or monitor treatment for MRSA infections. RESULT CALLED TO, READ BACK BY AND VERIFIED WITH: KRISTY BASS 07/10/18 1957 KLW Performed at [facility], [address] [place], [place] [address]   Aerobic/Anaerobic Culture (surgical/deep wound)     Status: None   Collection Time: 07/11/18 12:40 PM  Result Value Ref Range Status   Specimen Description   Final    ULCER RIGHT FOOT WOUND Performed at [facility], [address] [place], [place] [address]    Special Requests   Final    NONE Performed at [facility], [address] [place], [place] [address]    Gram Stain NO WBC SEEN NO ORGANISMS SEEN   Final   Culture   Final    MODERATE METHICILLIN RESISTANT STAPHYLOCOCCUS AUREUS MODERATE STREPTOCOCCUS PYOGENES NO ANAEROBES  ISOLATED Performed at [facility], [address], [place], [place] [address]    Report Status 07/16/2018 FINAL  Final   Organism ID, Bacteria METHICILLIN RESISTANT STAPHYLOCOCCUS AUREUS  Final      Susceptibility   Methicillin resistant staphylococcus aureus - MIC*    CIPROFLOXACIN >=8 RESISTANT Resistant     ERYTHROMYCIN <=0.25 SENSITIVE Sensitive     GENTAMICIN <=0.5 SENSITIVE Sensitive     OXACILLIN >=4 RESISTANT Resistant     TETRACYCLINE <=1 SENSITIVE Sensitive     VANCOMYCIN 1 SENSITIVE Sensitive     TRIMETH/SULFA <=10 SENSITIVE Sensitive     CLINDAMYCIN <=0.25 SENSITIVE Sensitive     RIFAMPIN <=0.5 SENSITIVE Sensitive     Inducible Clindamycin NEGATIVE Sensitive     * MODERATE METHICILLIN RESISTANT STAPHYLOCOCCUS AUREUS    Radiology Reports Dg Chest Port 1 View  Result Date: 07/16/2018 CLINICAL DATA:  Shortness of breath, coughing up yellow sputum, history COPD, diabetes mellitus, smoker EXAM: PORTABLE CHEST 1 VIEW COMPARISON:  Portable exam 1416 hours compared to 06/11/2018 FINDINGS: Normal heart size and mediastinal contours. Question mild pulmonary vascular congestion. Atherosclerotic calcification aorta. Diffuse interstitial prominence and scattered Kerley B-lines at the mid to lower lungs question pulmonary edema. Decreased bibasilar  effusions and atelectasis versus previous exam. Question subtle developing infiltrate RIGHT upper lobe. No pneumothorax. Bones demineralized. IMPRESSION: Question subtle RIGHT upper lobe infiltrate. Improved bibasilar pleural effusions and atelectasis. Persistent interstitial prominence in mid to lower lungs suggesting minimal pulmonary edema. Electronically Signed   By: Ulyses Southward M.D.   On: 07/16/2018 14:39   Dg Foot Complete Right  Result Date: 07/10/2018 CLINICAL DATA:  Foot ulcer. EXAM: RIGHT FOOT COMPLETE - 3+ VIEW COMPARISON:  06/03/2018 FINDINGS: Stable postsurgical resection of the distal first metatarsal bone. Again seen is  irregularity of the base of the first proximal phalanx possibly due to ongoing osteomyelitis. 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 Cortical irregularity of the remaining metatarsal bone is stable from November 2019. There is a deep penetrating wound in the plantar foot at the level of the first metatarsophalangeal joint. IMPRESSION: Probable surgical resection of the distal first metatarsal bone. Deep penetrating wound at the level of the first metatarsophalangeal joint, with cortical irregularity of the base of the first proximal phalanx, suggestive possible osteomyelitis. Electronically Signed   By: Ted Mcalpine M.D.   On: 07/10/2018 14:27     CBC Recent Labs  Lab 07/10/18 0056 07/11/18 0547 07/12/18 0433 07/13/18 0159 07/14/18 0506  WBC 7.8 7.4 8.1 8.1 8.2  HGB 8.9* 8.3* 7.8* 8.3* 8.0*  HCT 29.2* 27.0* 26.2* 27.9* 26.3*  PLT 233 192 176 187 193  MCV 91.5 92.8 93.6 94.3 92.6  MCH 27.9 28.5 27.9 28.0 28.2  MCHC 30.5 30.7 29.8* 29.7* 30.4  RDW 14.7 14.5 14.6 14.6 14.6    Chemistries  Recent Labs  Lab 07/10/18 0056 07/11/18 0547 07/12/18 0433 07/14/18 1432 07/15/18 0609 07/16/18 0331  NA 134* 134* 137  --  138 136  K 4.0 4.4 4.2  --  4.0 4.4  CL 98 98 102  --  102 104  CO2 27 28 28   --  29 27  GLUCOSE 371* 428* 316*  --  167* 226*  BUN 34* 32* 26*  --  28* 23  CREATININE 1.04* 1.04* 0.99 0.82 0.68 0.66  CALCIUM 8.2* 8.2* 8.3*  --  8.4* 8.0*  MG  --  2.1  --   --  2.2  --    ------------------------------------------------------------------------------------------------------------------ estimated creatinine clearance is 54.3 mL/min (by C-G formula based on SCr of 0.66 mg/dL). ------------------------------------------------------------------------------------------------------------------ No results for input(s): HGBA1C in the last 72  hours. ------------------------------------------------------------------------------------------------------------------ No results for input(s): CHOL, HDL, LDLCALC, TRIG, CHOLHDL, LDLDIRECT in the last 72 hours. ------------------------------------------------------------------------------------------------------------------ No results for input(s): TSH, T4TOTAL, T3FREE, THYROIDAB in the last 72 hours.  Invalid input(s): FREET3 ------------------------------------------------------------------------------------------------------------------ No results for input(s): VITAMINB12, FOLATE, FERRITIN, TIBC, IRON, RETICCTPCT in the last 72 hours.  Coagulation profile Recent Labs  Lab 07/09/18 1913 07/11/18 0547  INR 1.32 1.03    No results for input(s): DDIMER in the last 72 hours.  Cardiac Enzymes Recent Labs  Lab 07/16/18 1439  TROPONINI <0.03   ------------------------------------------------------------------------------------------------------------------ Invalid input(s): POCBNP    Assessment & Plan   Patient is 63 year old with bilateral infected diabetic foot wounds  1.  Decrease in responsiveness tachycardia A stat ABG has been ordered Stat abg and Lopressor was given heart rate improved patient is more awake We will continue to monitor here may need transfer to the ICU      Code Status Orders  (From admission, onward)         Start     Ordered   07/09/18 1731  Limited resuscitation (  code)  Continuous    Question Answer Comment  In the event of cardiac or respiratory ARREST: Initiate Code Blue, Call Rapid Response Yes   In the event of cardiac or respiratory ARREST: Perform CPR Yes   In the event of cardiac or respiratory ARREST: Perform Intubation/Mechanical Ventilation No   In the event of cardiac or respiratory ARREST: Use NIPPV/BiPAp only if indicated Yes   In the event of cardiac or respiratory ARREST: Administer ACLS medications if indicated Yes   In  the event of cardiac or respiratory ARREST: Perform Defibrillation or Cardioversion if indicated Yes      07/09/18 1730        Code Status History    Date Active Date Inactive Code Status Order ID Comments User Context   06/03/2018 0621 06/13/2018 2013 Full Code [id number]  Arnaldo Nataliamond, Michael S, MD Inpatient   05/23/2018 0253 06/01/2018 2158 Full Code [id number]  Cammy CopaMaier, Angela, MD Inpatient   05/23/2018 0138 05/23/2018 0253 DNR [id number]  Cammy CopaMaier, Angela, MD ED   04/10/2018 1111 04/11/2018 1554 DNR [id number]  Milagros LollSudini, Srikar, MD Inpatient   04/09/2018 1519 04/10/2018 1111 Full Code [id number]  Milagros LollSudini, Srikar, MD Inpatient   04/09/2018 1337 04/09/2018 1519 DNR [id number]  Milagros LollSudini, Srikar, MD Inpatient   04/09/2018 0012 04/09/2018 1337 Full Code [id number]  Oralia ManisWillis, David, MD ED                 Lab Results  Component Value Date   PLT 193 07/14/2018     Time Spent in minutes   45 minutes of critical care time spent Auburn BilberryShreyang Leanda Padmore M.D on 07/16/2018 at 4:52 PM  Between 7am to 6pm - Pager - [phone]  After 6pm go to [email or web address] - Social research officer, [email or web address] [email or web address]  [facility]   Office  [phone]

## 2018-07-16 NOTE — Significant Event (Signed)
Rapid Response Event Note  Overview: Time Called: 1622 Arrival Time: 1624 Event Type: Respiratory, Cardiac  Initial Focused Assessment: called rapid response for tachypnia and tachycardia. Upon arrival pt laying in bed, on NRB, tachycardic on monitor.   Interventions:Dr Sh Patel to bedside, EKG ordered, ABG, Metoprolol given and respiratory weaning down O2.  Plan of Care (if not transferred): Call if further assistance needed.  Event Summary: Name of Physician Notified: Retta MacSh Patel, MD at 1622    at    Outcome: Stayed in room and stabalized     Helga Asbury A

## 2018-07-16 NOTE — Evaluation (Signed)
Physical Therapy Evaluation Patient Details Name: April KillingsDoris J Panchal MRN: [id number] DOB: 1956-05-08 Today's Date: 07/16/2018   History of Present Illness  Pt is a 63 y.o. female presenting to hospital 07/09/18 (direct podiatry office admit).  Pt with R foot infection and L heel ulceration (possible osteomyelitis R foot).  S/p excisional debridement R foot with wound VAC placement 07/11/18.  S/p angio 07/15/18.  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  Clinical Impression  Prior to hospital admission, pt was living with a friend in 1 level home with 4 steps to enter (B railings).  Pt reports her friend and family assisted her with all transfers and getting in/out of home via "carrying" her (d/t B foot wounds).  Pt reports using manual w/c at home and [facility].  Pt also reports she had appropriate amount of help/assist at home and has no concerns with current home situation (also had home health nursing care).  Currently pt is on bedrest with NWB'ing B LE's so limited with physical therapy evaluation.  Pt able to perform 1 B LE and a few B UE therapeutic exercises but LE exercise limited d/t B foot pain (7.5/10--nurse notified) and limited with exercises in general d/t fatigue.  Pt would benefit from skilled PT to address noted impairments and functional limitations (see below for any additional details).  Upon hospital discharge, recommend pt discharge with 24/7 assist with functional mobility (w/c level) and HHPT.  Pt may benefit from hoyer lift at home to decrease caregiver assist levels.    Follow Up Recommendations Supervision/Assistance - 24 hour;Home health PT    Equipment Recommendations  Wheelchair (measurements PT);Wheelchair cushion (measurements PT);3in1 (PT)(pt reports having both at home already; may benefit from [employer])    Recommendations for Other Services       Precautions / Restrictions Precautions Precautions: Fall Precaution  Comments: Bedrest; Wound vac R foot Restrictions Weight Bearing Restrictions: Yes RLE Weight Bearing: Non weight bearing LLE Weight Bearing: Non weight bearing Other Position/Activity Restrictions: Strict NWB B LE's      Mobility  Bed Mobility               General bed mobility comments: Deferred (pt on bedrest)  Transfers                 General transfer comment: Deferred (pt on bedrest)  Ambulation/Gait             General Gait Details: Deferred: pt on bedrest with strict NWB'ing B LE's and has not been recently ambulatory  Stairs            Wheelchair Mobility    Modified Rankin (Stroke Patients Only)       Balance Overall balance assessment: (Deferred (pt on bedrest))                                           Pertinent Vitals/Pain Pain Assessment: 0-10 Pain Score: (7.5/10) Pain Location: B feet Pain Descriptors / Indicators: Aching;Sore Pain Intervention(s): Limited activity within patient's tolerance;Monitored during session;Repositioned;Patient requesting pain meds-RN notified    Home Living Family/patient expects to be discharged to:: Private residence Living Arrangements: Non-relatives/Friends(Friend) Available Help at Discharge: Friend(s);Family;Available 24 hours/day;Home health Type of Home: Apartment Home Access: Stairs to enter Entrance Stairs-Rails: Right;Left;Can reach both Entrance Stairs-Number of Steps: 4 Home Layout: One level Home  Equipment: Dan HumphreysWalker - 2 wheels;Cane - single point;Bedside commode;Wheelchair - manual      Prior Function Level of Independence: Needs assistance   Gait / Transfers Assistance Needed: Pt reports that her friend (that she lives with) and granddaughter have been carrying her to transfer to different surfaces (d/t B foot wounds)  ADL's / Homemaking Assistance Needed: Assist for bathing, dressing, meals, ADL's in general.        Hand Dominance        Extremity/Trunk  Assessment   Upper Extremity Assessment Upper Extremity Assessment: (B shoulder flexion AROM to 90 degrees; at least 3/5 AROM elbow flexion/extension; good B hand grip strength)    Lower Extremity Assessment Lower Extremity Assessment: Generalized weakness;RLE deficits/detail;LLE deficits/detail RLE Deficits / Details: at least 3/5 hip flexion and knee flexion/extension in bed; deferred ankle/foot assessment d/t pt reports of significant foot pain with any movement RLE: Unable to fully assess due to pain LLE Deficits / Details: at least 3/5 hip flexion and knee flexion/extension in bed; L knee just short of neutral knee extension; deferred ankle/foot assessment d/t pt reports of significant foot pain with any movement LLE: Unable to fully assess due to pain    Cervical / Trunk Assessment Cervical / Trunk Assessment: Kyphotic  Communication   Communication: HOH  Cognition Arousal/Alertness: (Pt appearing tired (pt reports not sleeping well d/t B foot pain last night)) Behavior During Therapy: WFL for tasks assessed/performed Overall Cognitive Status: Within Functional Limits for tasks assessed                                        General Comments General comments (skin integrity, edema, etc.): R foot wound vac and B foot dressings intact; dressings noted B groin region (no drainage noted beginning and end of session).  Nursing cleared pt for participation in physical therapy (exercises in bed).  Pt agreeable to PT session.    Exercises Total Joint Exercises Heel Slides: AAROM;Strengthening;Both;10 reps;Supine(limited B hip flexion per pt comfort) General Exercises - Upper Extremity Shoulder Flexion: AAROM;Strengthening;Both;10 reps;Supine(AAROM to grossly 90 degrees flexion) Elbow Flexion: AROM;Strengthening;Both;10 reps;Supine(elbows resting on bed) Elbow Extension: AROM;Strengthening;Both;10 reps;Supine(elbows resting on bed) Other Exercises Other Exercises: B  hand squeeze x10 reps AROM   Assessment/Plan    PT Assessment Patient needs continued PT services  PT Problem List Decreased strength;Decreased range of motion;Decreased activity tolerance;Decreased balance;Decreased mobility;Decreased knowledge of use of DME;Decreased knowledge of precautions;Pain;Decreased skin integrity       PT Treatment Interventions DME instruction;Functional mobility training;Therapeutic activities;Therapeutic exercise;Balance training;Patient/family education    PT Goals (Current goals can be found in the Care Plan section)  Acute Rehab PT Goals Patient Stated Goal: to have less foot pain PT Goal Formulation: With patient Time For Goal Achievement: 07/30/18 Potential to Achieve Goals: Fair    Frequency Min 2X/week   Barriers to discharge        Co-evaluation               AM-PAC PT "6 Clicks" Mobility  Outcome Measure Help needed turning from your back to your side while in a flat bed without using bedrails?: A Little Help needed moving from lying on your back to sitting on the side of a flat bed without using bedrails?: A Lot Help needed moving to and from a bed to a chair (including a wheelchair)?: Total Help needed standing up from a chair using your  arms (e.g., wheelchair or bedside chair)?: Total Help needed to walk in hospital room?: Total Help needed climbing 3-5 steps with a railing? : Total 6 Click Score: 9    End of Session   Activity Tolerance: Patient limited by fatigue Patient left: in bed;with call bell/phone within reach;with bed alarm set;with nursing/sitter in room Nurse Communication: Mobility status;Patient requests pain meds;Precautions;Weight bearing status PT Visit Diagnosis: Other abnormalities of gait and mobility (R26.89);Muscle weakness (generalized) (M62.81);Pain Pain - Right/Left: Right(Left) Pain - part of body: Ankle and joints of foot    Time: 0175-1025 PT Time Calculation (min) (ACUTE ONLY): 21  min   Charges:   PT Evaluation $PT Eval Low Complexity: 1 Low PT Treatments $Therapeutic Exercise: 8-22 mins       Hendricks Limes, PT 07/16/18, 9:45 AM [phone]

## 2018-07-16 NOTE — Progress Notes (Signed)
[facility]  Daily Progress Note   Subjective  - Day of Surgery  Patient lethargic and tachypneic and tachycardic.  Rapid response is being called as I see the patient.  He has been less responsive today  Objective Vitals:   07/16/18 1339 07/16/18 1540 07/16/18 1542 07/16/18 1621  BP: (!) 106/58 101/71  107/74  Pulse: (!) 137 (!) 138 (!) 134 (!) 131  Resp:      Temp: 98.4 F (36.9 C)     TempSrc: Oral     SpO2: 94% (!) 88% 95% 96%  Weight:      Height:        Intake/Output Summary (Last 24 hours) at 07/16/2018 1718 Last data filed at 07/16/2018 0929 Gross per 24 hour  Intake 1127.24 ml  Output 1100 ml  Net 27.24 ml    PULM  tachypneic and coarse CV  tachycardic VASC  right foot is warm with decent capillary refill, left foot has somewhat sluggish capillary refill but still warm.  Laboratory CBC    Component Value Date/Time   WBC 8.2 07/14/2018 0506   HGB 8.0 (L) 07/14/2018 0506   HGB 13.6 10/14/2014 0644   HCT 26.3 (L) 07/14/2018 0506   HCT 41.3 10/14/2014 0644   PLT 193 07/14/2018 0506   PLT 156 10/14/2014 0644    BMET    Component Value Date/Time   NA 136 07/16/2018 0331   NA 142 10/14/2014 0644   K 4.4 07/16/2018 0331   K 3.6 10/14/2014 0644   CL 104 07/16/2018 0331   CL 109 10/14/2014 0644   CO2 27 07/16/2018 0331   CO2 29 10/14/2014 0644   GLUCOSE 226 (H) 07/16/2018 0331   GLUCOSE 142 (H) 10/14/2014 0644   BUN 23 07/16/2018 0331   BUN 9 10/14/2014 0644   CREATININE 0.66 07/16/2018 0331   CREATININE 0.53 10/14/2014 0644   CALCIUM 8.0 (L) 07/16/2018 0331   CALCIUM 7.7 (L) 10/14/2014 0644   GFRNONAA >60 07/16/2018 0331   GFRNONAA >60 10/14/2014 0644   GFRAA >60 07/16/2018 0331   GFRAA >60 10/14/2014 9201    Assessment/Planning:    PAD with ulceration bilateral lower extremities  Needs bilateral femoral endarterectomies, but given her medical status I think that may end up being delayed.  She is currently on the  schedule for tomorrow.  Will reassess tomorrow to see if her medical status will allow surgery  Festus Barren  07/16/2018, 5:18 PM

## 2018-07-16 NOTE — Progress Notes (Signed)
07/16/2018 17:15  Heard arguing and cursing in patient's room from hall.  Observed patient's dughter leaving the room quickly who remarked to me that the man in the patient's room needs to be removed.  I went into the patient's room and asked the patient directly if she wanted anyone to leave her room at this time.  She said she did not.  Explained to the family that speaking loud profanities disturbs other patienbts and their families and is not allowed.  They expressed understanding.  Bradly Chris, RN

## 2018-07-16 NOTE — Progress Notes (Signed)
[employer] - [place] [facility]                                                                                                                                                                                  Patient Demographics   April [name], is a 63 y.o. female, DOB - 02/24/1956, ZOX:[id number]  Admit date - 07/09/2018   Admitting Physician Campbell StallKaty Dodd Mayo, MD  Outpatient Primary MD for the patient is Mickel FuchsWroth, Thomas H, MD   LOS - 7  Subjective:  called by nurse due to patient having nausea and vomiting and sleepy I came to revaluate patient drowsy, elevated heart rate    Review of Systems:   CONSTITUTIONAL: No documented fever. No fatigue, weakness. No weight gain, no weight loss.  EYES: No blurry or double vision.  ENT: No tinnitus. No postnasal drip. No redness of the oropharynx.  RESPIRATORY: No cough, no wheeze, no hemoptysis. No dyspnea.  CARDIOVASCULAR: No chest pain. No orthopnea. No palpitations. No syncope.  GASTROINTESTINAL: + nausea, no vomiting or diarrhea. No abdominal pain. No melena or hematochezia.  GENITOURINARY: No dysuria or hematuria.  ENDOCRINE: No polyuria or nocturia. No heat or cold intolerance.  HEMATOLOGY: No anemia. No bruising. No bleeding.  INTEGUMENTARY: No rashes. No lesions.  MUSCULOSKELETAL: No arthritis. No swelling. No gout.  NEUROLOGIC: No numbness, tingling, or ataxia. No seizure-type activity.  PSYCHIATRIC: No anxiety. No insomnia. No ADD.    Vitals:   Vitals:   07/16/18 1250 07/16/18 1339 07/16/18 1540 07/16/18 1542  BP: 109/68 (!) 106/58 101/71   Pulse: (!) 104 (!) 137 (!) 138 (!) 134  Resp: 16     Temp: 98.4 F (36.9 C) 98.4 F (36.9 C)    TempSrc: Oral Oral    SpO2: 100% 94% (!) 88% 95%  Weight:      Height:        Wt Readings from Last 3 Encounters:  07/16/18 47.8 kg  06/13/18 49.6 kg  05/30/18 52.1 kg     Intake/Output Summary (Last 24 hours) at 07/16/2018 1603 Last data filed at 07/16/2018  [id number] Gross per 24 hour  Intake 1127.24 ml  Output 1100 ml  Net 27.24 ml    Physical Exam:   GENERAL: ill appearing  HEAD, EYES, EARS, NOSE AND THROAT: Atraumatic, normocephalic. Extraocular muscles are intact. Pupils equal and reactive to light. Sclerae anicteric. No conjunctival injection. No oro-pharyngeal erythema.  NECK: Supple. There is no jugular venous distention. No bruits, no lymphadenopathy, no thyromegaly.  HEART: tachycardia , no murmer and gallops LUNGS: Clear to auscultation bilaterally. No rales or rhonchi. No wheezes.  ABDOMEN: Soft, flat, nontender, nondistended. Has good bowel  sounds. No hepatosplenomegaly appreciated.  EXTREMITIES: No evidence of any cyanosis, clubbing, or peripheral edema.  +2 pedal and radial pulses bilaterally.  NEUROLOGIC: pt sleepy SKIN: Moist and warm with no rashes appreciated.  Psych: Not anxious, depressed LN: No inguinal LN enlargement    Antibiotics   Anti-infectives (From admission, onward)   Start     Dose/Rate Route Frequency Ordered Stop   07/17/18 0500  ceFAZolin (ANCEF) IVPB 2g/100 mL premix    Note to Pharmacy:  Send with pt to OR   2 g 200 mL/hr over 30 Minutes Intravenous On call 07/16/18 1214 07/18/18 0500   07/15/18 1800  vancomycin (VANCOCIN) IVPB 1000 mg/200 mL premix     1,000 mg 200 mL/hr over 60 Minutes Intravenous Every 24 hours 07/15/18 1232     07/15/18 0500  ceFAZolin (ANCEF) IVPB 2g/100 mL premix    Note to Pharmacy:  Send with patient to specials   2 g 200 mL/hr over 30 Minutes Intravenous On call 07/14/18 1704 07/15/18 1425   07/14/18 2230  ceFAZolin (ANCEF) IVPB 2g/100 mL premix     2 g 200 mL/hr over 30 Minutes Intravenous  Once 07/14/18 2222 07/16/18 0206   07/14/18 1800  vancomycin (VANCOCIN) 1,250 mg in sodium chloride 0.9 % 250 mL IVPB  Status:  Discontinued     1,250 mg 166.7 mL/hr over 90 Minutes Intravenous Every 24 hours 07/14/18 1517 07/15/18 1232   07/11/18 1800  vancomycin (VANCOCIN) IVPB  1000 mg/200 mL premix  Status:  Discontinued     1,000 mg 200 mL/hr over 60 Minutes Intravenous Every 24 hours 07/11/18 1631 07/14/18 1517   07/11/18 1645  piperacillin-tazobactam (ZOSYN) IVPB 3.375 g  Status:  Discontinued     3.375 g 12.5 mL/hr over 240 Minutes Intravenous Every 8 hours 07/11/18 1631 07/13/18 1847   07/10/18 1600  vancomycin (VANCOCIN) IVPB 1000 mg/200 mL premix  Status:  Discontinued     1,000 mg 200 mL/hr over 60 Minutes Intravenous Every 24 hours 07/10/18 1507 07/11/18 1518   07/09/18 2000  piperacillin-tazobactam (ZOSYN) IVPB 3.375 g  Status:  Discontinued     3.375 g 12.5 mL/hr over 240 Minutes Intravenous Every 8 hours 07/09/18 1911 07/11/18 1518   07/09/18 2000  vancomycin (VANCOCIN) IVPB 750 mg/150 ml premix  Status:  Discontinued     750 mg 150 mL/hr over 60 Minutes Intravenous Every 36 hours 07/09/18 1911 07/10/18 1507      Medications   Scheduled Meds: . aspirin  81 mg Oral Daily  . atorvastatin  40 mg Oral q1800  . ferrous sulfate  325 mg Oral BID WC  . furosemide  40 mg Oral Daily  . insulin aspart  0-15 Units Subcutaneous TID WC  . insulin aspart  0-5 Units Subcutaneous QHS  . insulin aspart  3 Units Subcutaneous TID WC  . insulin glargine  10 Units Subcutaneous QHS  . ipratropium-albuterol  3 mL Nebulization Once  . ipratropium-albuterol      . mometasone-formoterol  2 puff Inhalation BID  . multivitamin-lutein  1 capsule Oral Daily  . pantoprazole  40 mg Oral BID  . polyethylene glycol  17 g Oral Daily  . ENSURE MAX PROTEIN  11 oz Oral BID  . tamsulosin  0.4 mg Oral Daily  . tiotropium  18 mcg Inhalation Daily  . ascorbic acid  500 mg Oral BID   Continuous Infusions: . sodium chloride    . [START ON 07/17/2018]  ceFAZolin (ANCEF) IV    .  heparin 1,050 Units/hr (07/16/18 [id number])  . vancomycin Stopped (07/15/18 2355)   PRN Meds:.sodium chloride, acetaminophen, albuterol, ALPRAZolam, guaiFENesin-dextromethorphan, HYDROcodone-acetaminophen,  HYDROmorphone (DILAUDID) injection, morphine injection, nicotine, ondansetron (ZOFRAN) IV, ondansetron (ZOFRAN) IV, polyethylene glycol   Data Review:   Micro Results Recent Results (from the past 240 hour(s))  Aerobic/Anaerobic Culture (surgical/deep wound)     Status: None   Collection Time: 07/09/18  2:31 PM  Result Value Ref Range Status   Specimen Description   Final    HEEL Performed at [facility], [address] [place], [place] [address]    Special Requests   Final    NONE Performed at [facility], [address] [place], [place] [address]    Gram Stain   Final    MODERATE WBC PRESENT, PREDOMINANTLY PMN ABUNDANT GRAM POSITIVE COCCI    Culture   Final    MODERATE METHICILLIN RESISTANT STAPHYLOCOCCUS AUREUS NO ANAEROBES ISOLATED Performed at [facility], [address] [place], [place] [address]    Report Status 07/14/2018 FINAL  Final   Organism ID, Bacteria METHICILLIN RESISTANT STAPHYLOCOCCUS AUREUS  Final      Susceptibility   Methicillin resistant staphylococcus aureus - MIC*    CIPROFLOXACIN >=8 RESISTANT Resistant     ERYTHROMYCIN >=8 RESISTANT Resistant     GENTAMICIN <=0.5 SENSITIVE Sensitive     OXACILLIN >=4 RESISTANT Resistant     TETRACYCLINE <=1 SENSITIVE Sensitive     VANCOMYCIN 1 SENSITIVE Sensitive     TRIMETH/SULFA <=10 SENSITIVE Sensitive     CLINDAMYCIN <=0.25 SENSITIVE Sensitive     RIFAMPIN <=0.5 SENSITIVE Sensitive     Inducible Clindamycin NEGATIVE Sensitive     * MODERATE METHICILLIN RESISTANT STAPHYLOCOCCUS AUREUS  MRSA PCR Screening     Status: Abnormal   Collection Time: 07/10/18  6:21 PM  Result Value Ref Range Status   MRSA by PCR POSITIVE (A) NEGATIVE Final    Comment:        The GeneXpert MRSA Assay (FDA approved for NASAL specimens only), is one component of a comprehensive MRSA colonization surveillance program. It is not intended to diagnose MRSA infection nor to guide or monitor treatment  for MRSA infections. RESULT CALLED TO, READ BACK BY AND VERIFIED WITH: KRISTY BASS 07/10/18 1957 KLW Performed at [facility], [address] [place], [place] [address]   Aerobic/Anaerobic Culture (surgical/deep wound)     Status: None   Collection Time: 07/11/18 12:40 PM  Result Value Ref Range Status   Specimen Description   Final    ULCER RIGHT FOOT WOUND Performed at [facility], [address] [place], [place] [address]    Special Requests   Final    NONE Performed at [facility], [address] [place], [place] [address]    Gram Stain NO WBC SEEN NO ORGANISMS SEEN   Final   Culture   Final    MODERATE METHICILLIN RESISTANT STAPHYLOCOCCUS AUREUS MODERATE STREPTOCOCCUS PYOGENES NO ANAEROBES ISOLATED Performed at [facility], [address] [place], [place] [address]    Report Status 07/16/2018 FINAL  Final   Organism ID, Bacteria METHICILLIN RESISTANT STAPHYLOCOCCUS AUREUS  Final      Susceptibility   Methicillin resistant staphylococcus aureus - MIC*    CIPROFLOXACIN >=8 RESISTANT Resistant     ERYTHROMYCIN <=0.25 SENSITIVE Sensitive     GENTAMICIN <=0.5 SENSITIVE Sensitive     OXACILLIN >=4 RESISTANT Resistant     TETRACYCLINE <=1 SENSITIVE Sensitive  VANCOMYCIN 1 SENSITIVE Sensitive     TRIMETH/SULFA <=10 SENSITIVE Sensitive     CLINDAMYCIN <=0.25 SENSITIVE Sensitive     RIFAMPIN <=0.5 SENSITIVE Sensitive     Inducible Clindamycin NEGATIVE Sensitive     * MODERATE METHICILLIN RESISTANT STAPHYLOCOCCUS AUREUS    Radiology Reports Dg Chest Port 1 View  Result Date: 07/16/2018 CLINICAL DATA:  Shortness of breath, coughing up yellow sputum, history COPD, diabetes mellitus, smoker EXAM: PORTABLE CHEST 1 VIEW COMPARISON:  Portable exam 1416 hours compared to 06/11/2018 FINDINGS: Normal heart size and mediastinal contours. Question mild pulmonary vascular congestion. Atherosclerotic calcification aorta. Diffuse interstitial  prominence and scattered Kerley B-lines at the mid to lower lungs question pulmonary edema. Decreased bibasilar effusions and atelectasis versus previous exam. Question subtle developing infiltrate RIGHT upper lobe. No pneumothorax. Bones demineralized. IMPRESSION: Question subtle RIGHT upper lobe infiltrate. Improved bibasilar pleural effusions and atelectasis. Persistent interstitial prominence in mid to lower lungs suggesting minimal pulmonary edema. Electronically Signed   By: Ulyses Southward M.D.   On: 07/16/2018 14:39   Dg Foot Complete Right  Result Date: 07/10/2018 CLINICAL DATA:  Foot ulcer. EXAM: RIGHT FOOT COMPLETE - 3+ VIEW COMPARISON:  06/03/2018 FINDINGS: Stable postsurgical resection of the distal first metatarsal bone. Again seen is irregularity of the base of the first proximal phalanx possibly due to ongoing osteomyelitis. 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 Cortical irregularity of the remaining metatarsal bone is stable from November 2019. There is a deep penetrating wound in the plantar foot at the level of the first metatarsophalangeal joint. IMPRESSION: Probable surgical resection of the distal first metatarsal bone. Deep penetrating wound at the level of the first metatarsophalangeal joint, with cortical irregularity of the base of the first proximal phalanx, suggestive possible osteomyelitis. Electronically Signed   By: Ted Mcalpine M.D.   On: 07/10/2018 14:27     CBC Recent Labs  Lab 07/10/18 0056 07/11/18 0547 07/12/18 0433 07/13/18 0159 07/14/18 0506  WBC 7.8 7.4 8.1 8.1 8.2  HGB 8.9* 8.3* 7.8* 8.3* 8.0*  HCT 29.2* 27.0* 26.2* 27.9* 26.3*  PLT 233 192 176 187 193  MCV 91.5 92.8 93.6 94.3 92.6  MCH 27.9 28.5 27.9 28.0 28.2  MCHC 30.5 30.7 29.8* 29.7* 30.4  RDW 14.7 14.5 14.6 14.6 14.6    Chemistries  Recent Labs  Lab 07/10/18 0056 07/11/18 0547 07/12/18 0433 07/14/18 1432 07/15/18 0609 07/16/18 0331  NA 134* 134* 137  --  138 136  K 4.0  4.4 4.2  --  4.0 4.4  CL 98 98 102  --  102 104  CO2 27 28 28   --  29 27  GLUCOSE 371* 428* 316*  --  167* 226*  BUN 34* 32* 26*  --  28* 23  CREATININE 1.04* 1.04* 0.99 0.82 0.68 0.66  CALCIUM 8.2* 8.2* 8.3*  --  8.4* 8.0*  MG  --  2.1  --   --  2.2  --    ------------------------------------------------------------------------------------------------------------------ estimated creatinine clearance is 54.3 mL/min (by C-G formula based on SCr of 0.66 mg/dL). ------------------------------------------------------------------------------------------------------------------ No results for input(s): HGBA1C in the last 72 hours. ------------------------------------------------------------------------------------------------------------------ No results for input(s): CHOL, HDL, LDLCALC, TRIG, CHOLHDL, LDLDIRECT in the last 72 hours. ------------------------------------------------------------------------------------------------------------------ No results for input(s): TSH, T4TOTAL, T3FREE, THYROIDAB in the last 72 hours.  Invalid input(s): FREET3 ------------------------------------------------------------------------------------------------------------------ No results for input(s): VITAMINB12, FOLATE, FERRITIN, TIBC, IRON, RETICCTPCT in the last 72 hours.  Coagulation profile Recent Labs  Lab 07/09/18 1913 07/11/18 0547  INR 1.32 1.03    No results for input(s): DDIMER in the last 72 hours.  Cardiac Enzymes Recent Labs  Lab 07/16/18 1439  TROPONINI <0.03   ------------------------------------------------------------------------------------------------------------------ Invalid input(s): POCBNP    Assessment & Plan   Patient is 62 year old with bilateral infected diabetic foot wounds  1.  Lethargy I will obtain a stat chest x-ray we will also obtain ABG Check cardiac enzymes EKG We will start patient on low-dose IV Lopressor scheduled heart rate today is good       Code Status Orders  (From admission, onward)         Start     Ordered   07/09/18 1731  Limited resuscitation (code)  Continuous    Question Answer Comment  In the event of cardiac or respiratory ARREST: Initiate Code Blue, Call Rapid Response Yes   In the event of cardiac or respiratory ARREST: Perform CPR Yes   In the event of cardiac or respiratory ARREST: Perform Intubation/Mechanical Ventilation No   In the event of cardiac or respiratory ARREST: Use NIPPV/BiPAp only if indicated Yes   In the event of cardiac or respiratory ARREST: Administer ACLS medications if indicated Yes   In the event of cardiac or respiratory ARREST: Perform Defibrillation or Cardioversion if indicated Yes      07/09/18 1730        Code Status History    Date Active Date Inactive Code Status Order ID Comments User Context   06/03/2018 0621 06/13/2018 2013 Full Code [id number]  Arnaldo Natal, MD Inpatient   05/23/2018 0253 06/01/2018 2158 Full Code [id number]  Cammy Copa, MD Inpatient   05/23/2018 0138 05/23/2018 0253 DNR [id number]  Cammy Copa, MD ED   04/10/2018 1111 04/11/2018 1554 DNR [id number]  Milagros Loll, MD Inpatient   04/09/2018 1519 04/10/2018 1111 Full Code [id number]  Milagros Loll, MD Inpatient   04/09/2018 1337 04/09/2018 1519 DNR [id number]  Milagros Loll, MD Inpatient   04/09/2018 0012 04/09/2018 1337 Full Code [id number]  Oralia Manis, MD ED                 Lab Results  Component Value Date   PLT 193 07/14/2018     Time Spent in minutes   35 minutes additional spent  Auburn Bilberry M.D on 07/16/2018 at 4:03 PM  Between 7am to 6pm - Pager - [phone]  After 6pm go to [email or web address] - Social research officer, government  [employer]   Office  [phone]

## 2018-07-17 ENCOUNTER — Inpatient Hospital Stay: Payer: Medicare HMO | Admitting: Anesthesiology

## 2018-07-17 ENCOUNTER — Inpatient Hospital Stay
Admission: AD | Admit: 2018-07-17 | Discharge: 2018-07-17 | Disposition: A | Discharge status: Home / Self Care | Payer: Medicare HMO | Source: Ambulatory Visit | Attending: Family Medicine | Admitting: Family Medicine

## 2018-07-17 ENCOUNTER — Encounter
Admission: AD | Disposition: A | Discharge status: Skilled Nursing Facility | Payer: Self-pay | Source: Ambulatory Visit | Attending: Internal Medicine

## 2018-07-17 ENCOUNTER — Inpatient Hospital Stay: Payer: Medicare HMO

## 2018-07-17 ENCOUNTER — Encounter: Payer: Self-pay | Admitting: Anesthesiology

## 2018-07-17 DIAGNOSIS — L97319 Non-pressure chronic ulcer of right ankle with unspecified severity: Secondary | ICD-10-CM

## 2018-07-17 DIAGNOSIS — I70239 Atherosclerosis of native arteries of right leg with ulceration of unspecified site: Secondary | ICD-10-CM

## 2018-07-17 DIAGNOSIS — L97329 Non-pressure chronic ulcer of left ankle with unspecified severity: Secondary | ICD-10-CM

## 2018-07-17 DIAGNOSIS — I70249 Atherosclerosis of native arteries of left leg with ulceration of unspecified site: Secondary | ICD-10-CM

## 2018-07-17 HISTORY — PX: ENDARTERECTOMY FEMORAL: SHX5804

## 2018-07-17 LAB — HEPARIN LEVEL (UNFRACTIONATED)
Heparin Unfractionated: 0.21 IU/mL — ABNORMAL LOW (ref 0.30–0.70)
Heparin Unfractionated: <0.1 IU/mL — ABNORMAL LOW (ref 0.30–0.70)

## 2018-07-17 LAB — CBC
HCT: 26.4 % — ABNORMAL LOW (ref 36.0–46.0)
Hemoglobin: 7.7 g/dL — ABNORMAL LOW (ref 12.0–15.0)
MCH: 28.2 pg (ref 26.0–34.0)
MCHC: 29.2 g/dL — ABNORMAL LOW (ref 30.0–36.0)
MCV: 96.7 fL (ref 80.0–100.0)
Platelets: 185 10*3/uL (ref 150–400)
RBC: 2.73 MIL/uL — ABNORMAL LOW (ref 3.87–5.11)
RDW: 15.1 % (ref 11.5–15.5)
WBC: 11.1 10*3/uL — ABNORMAL HIGH (ref 4.0–10.5)
nRBC: 0 % (ref 0.0–0.2)

## 2018-07-17 LAB — PROTIME-INR
INR: 1.17
Prothrombin Time: 14.8 seconds (ref 11.4–15.2)

## 2018-07-17 LAB — ECHOCARDIOGRAM COMPLETE
Height: 62 in
Weight: 1686.08 oz

## 2018-07-17 LAB — TROPONIN I: Troponin I: 1.19 ng/mL (ref <0.03)

## 2018-07-17 LAB — APTT: aPTT: 73 seconds — ABNORMAL HIGH (ref 24–36)

## 2018-07-17 LAB — MAGNESIUM: Magnesium: 2.1 mg/dL (ref 1.7–2.4)

## 2018-07-17 LAB — GLUCOSE, CAPILLARY
Glucose-Capillary: 131 mg/dL — ABNORMAL HIGH (ref 70–99)
Glucose-Capillary: 139 mg/dL — ABNORMAL HIGH (ref 70–99)
Glucose-Capillary: 159 mg/dL — ABNORMAL HIGH (ref 70–99)
Glucose-Capillary: 213 mg/dL — ABNORMAL HIGH (ref 70–99)
Glucose-Capillary: 238 mg/dL — ABNORMAL HIGH (ref 70–99)
Glucose-Capillary: 52 mg/dL — ABNORMAL LOW (ref 70–99)
Glucose-Capillary: 75 mg/dL (ref 70–99)

## 2018-07-17 LAB — BASIC METABOLIC PANEL
Anion gap: 10 (ref 5–15)
BUN: 30 mg/dL — ABNORMAL HIGH (ref 8–23)
CO2: 25 mmol/L (ref 22–32)
Calcium: 8.3 mg/dL — ABNORMAL LOW (ref 8.9–10.3)
Chloride: 99 mmol/L (ref 98–111)
Creatinine, Ser: 1.04 mg/dL — ABNORMAL HIGH (ref 0.44–1.00)
GFR, EST NON AFRICAN AMERICAN: 57 mL/min — AB (ref >60)
GLUCOSE: 260 mg/dL — AB (ref 70–99)
Potassium: 4.7 mmol/L (ref 3.5–5.1)
Sodium: 134 mmol/L — ABNORMAL LOW (ref 135–145)

## 2018-07-17 SURGERY — ENDARTERECTOMY, FEMORAL
Anesthesia: General | Laterality: Bilateral

## 2018-07-17 MED ORDER — ETOMIDATE 2 MG/ML IV SOLN
INTRAVENOUS | Status: DC | PRN
  Administered 2018-07-17: 8 mg via INTRAVENOUS

## 2018-07-17 MED ORDER — SUGAMMADEX SODIUM 200 MG/2ML IV SOLN
INTRAVENOUS | Status: DC | PRN
  Administered 2018-07-17: 90 mg via INTRAVENOUS

## 2018-07-17 MED ORDER — PROPOFOL 10 MG/ML IV BOLUS
INTRAVENOUS | Status: AC
  Filled 2018-07-17: qty 20

## 2018-07-17 MED ORDER — SUGAMMADEX SODIUM 200 MG/2ML IV SOLN
INTRAVENOUS | Status: AC
  Filled 2018-07-17: qty 2

## 2018-07-17 MED ORDER — FENTANYL CITRATE (PF) 100 MCG/2ML IJ SOLN
INTRAMUSCULAR | Status: AC
  Filled 2018-07-17: qty 2

## 2018-07-17 MED ORDER — ORAL CARE MOUTH RINSE
15.0000 mL | Freq: Two times a day (BID) | OROMUCOSAL | Status: DC
  Administered 2018-07-18 – 2018-07-26 (×11): 15 mL via OROMUCOSAL

## 2018-07-17 MED ORDER — PHENYLEPHRINE HCL 10 MG/ML IJ SOLN
INTRAMUSCULAR | Status: AC
  Filled 2018-07-17: qty 1

## 2018-07-17 MED ORDER — LIDOCAINE HCL (CARDIAC) PF 100 MG/5ML IV SOSY
PREFILLED_SYRINGE | INTRAVENOUS | Status: DC | PRN
  Administered 2018-07-17: 80 mg via INTRAVENOUS
  Administered 2018-07-17: 40 mg via INTRAVENOUS

## 2018-07-17 MED ORDER — ONDANSETRON HCL 4 MG/2ML IJ SOLN: 4.0000 mg | Freq: Once | INTRAMUSCULAR | Status: DC | PRN

## 2018-07-17 MED ORDER — PHENYLEPHRINE HCL 10 MG/ML IJ SOLN
INTRAMUSCULAR | Status: DC | PRN
  Administered 2018-07-17 (×2): 100 ug via INTRAVENOUS
  Administered 2018-07-17: 200 ug via INTRAVENOUS
  Administered 2018-07-17 (×2): 150 ug via INTRAVENOUS

## 2018-07-17 MED ORDER — ASPIRIN EC 81 MG PO TBEC
81.0000 mg | DELAYED_RELEASE_TABLET | Freq: Every day | ORAL | Status: DC
  Administered 2018-07-19 – 2018-07-26 (×7): 81 mg via ORAL
  Filled 2018-07-17 (×7): qty 1

## 2018-07-17 MED ORDER — MIDAZOLAM HCL 2 MG/2ML IJ SOLN
INTRAMUSCULAR | Status: DC | PRN
  Administered 2018-07-17: 1 mg via INTRAVENOUS

## 2018-07-17 MED ORDER — ROCURONIUM BROMIDE 100 MG/10ML IV SOLN
INTRAVENOUS | Status: DC | PRN
  Administered 2018-07-17: 10 mg via INTRAVENOUS
  Administered 2018-07-17 (×2): 20 mg via INTRAVENOUS

## 2018-07-17 MED ORDER — NITROGLYCERIN IN D5W 200-5 MCG/ML-% IV SOLN
INTRAVENOUS | Status: AC
  Filled 2018-07-17: qty 250

## 2018-07-17 MED ORDER — HEPARIN SODIUM (PORCINE) 1000 UNIT/ML IJ SOLN
INTRAMUSCULAR | Status: AC
  Filled 2018-07-17: qty 1

## 2018-07-17 MED ORDER — FENTANYL CITRATE (PF) 100 MCG/2ML IJ SOLN
INTRAMUSCULAR | Status: DC | PRN
  Administered 2018-07-17: 50 ug via INTRAVENOUS
  Administered 2018-07-17 (×3): 25 ug via INTRAVENOUS
  Administered 2018-07-17: 50 ug via INTRAVENOUS
  Administered 2018-07-17: 25 ug via INTRAVENOUS

## 2018-07-17 MED ORDER — LIDOCAINE HCL (PF) 2 % IJ SOLN
INTRAMUSCULAR | Status: AC
  Filled 2018-07-17: qty 10

## 2018-07-17 MED ORDER — KETAMINE HCL 50 MG/ML IJ SOLN
INTRAMUSCULAR | Status: AC
  Filled 2018-07-17: qty 10

## 2018-07-17 MED ORDER — EVICEL 5 ML EX KIT
PACK | CUTANEOUS | Status: AC
  Filled 2018-07-17: qty 1

## 2018-07-17 MED ORDER — SODIUM CHLORIDE 0.9 % IV SOLN
INTRAVENOUS | Status: DC | PRN
  Administered 2018-07-17: 15 ug/min via INTRAVENOUS

## 2018-07-17 MED ORDER — HEPARIN SODIUM (PORCINE) 1000 UNIT/ML IJ SOLN
INTRAMUSCULAR | Status: DC | PRN
  Administered 2018-07-17: 2000 [IU] via INTRAVENOUS
  Administered 2018-07-17: 4000 [IU] via INTRAVENOUS

## 2018-07-17 MED ORDER — ONDANSETRON HCL 4 MG/2ML IJ SOLN
INTRAMUSCULAR | Status: DC | PRN
  Administered 2018-07-17: 4 mg via INTRAVENOUS

## 2018-07-17 MED ORDER — DEXTROSE 50 % IV SOLN
INTRAVENOUS | Status: AC
  Filled 2018-07-17: qty 50

## 2018-07-17 MED ORDER — ONDANSETRON HCL 4 MG/2ML IJ SOLN
INTRAMUSCULAR | Status: AC
  Filled 2018-07-17: qty 2

## 2018-07-17 MED ORDER — SODIUM CHLORIDE (PF) 0.9 % IJ SOLN
INTRAMUSCULAR | Status: AC
  Filled 2018-07-17: qty 10

## 2018-07-17 MED ORDER — SUGAMMADEX SODIUM 500 MG/5ML IV SOLN
INTRAVENOUS | Status: AC
  Filled 2018-07-17: qty 5

## 2018-07-17 MED ORDER — DEXTROSE-NACL 5-0.9 % IV SOLN
INTRAVENOUS | Status: DC
  Administered 2018-07-17 (×2): via INTRAVENOUS

## 2018-07-17 MED ORDER — MIDAZOLAM HCL 2 MG/2ML IJ SOLN
INTRAMUSCULAR | Status: AC
  Filled 2018-07-17: qty 2

## 2018-07-17 MED ORDER — DEXTROSE 50 % IV SOLN
12.5000 g | Freq: Once | INTRAVENOUS | Status: AC
  Administered 2018-07-17: 12.5 g via INTRAVENOUS

## 2018-07-17 MED ORDER — PROPOFOL 500 MG/50ML IV EMUL
INTRAVENOUS | Status: AC
  Filled 2018-07-17: qty 50

## 2018-07-17 MED ORDER — ROCURONIUM BROMIDE 50 MG/5ML IV SOLN
INTRAVENOUS | Status: AC
  Filled 2018-07-17: qty 1

## 2018-07-17 MED ORDER — PROPOFOL 10 MG/ML IV BOLUS
INTRAVENOUS | Status: DC | PRN
  Administered 2018-07-17: 20 mg via INTRAVENOUS

## 2018-07-17 MED ORDER — FENTANYL CITRATE (PF) 100 MCG/2ML IJ SOLN: 25.0000 ug | INTRAMUSCULAR | Status: DC | PRN

## 2018-07-17 SURGICAL SUPPLY — 64 items
APPLIER CLIP 11 MED OPEN (CLIP)
APPLIER CLIP 9.375 SM OPEN (CLIP)
BAG COUNTER SPONGE EZ (MISCELLANEOUS) ×1 IMPLANT
BAG DECANTER FOR FLEXI CONT (MISCELLANEOUS) ×3 IMPLANT
BAG ISOLATATION DRAPE 20X20 ST (DRAPES) IMPLANT
BLADE SURG 15 STRL LF DISP TIS (BLADE) ×1 IMPLANT
BLADE SURG 15 STRL SS (BLADE) ×2
BLADE SURG SZ11 CARB STEEL (BLADE) ×3 IMPLANT
BOOT SUTURE AID YELLOW STND (SUTURE) ×3 IMPLANT
BRUSH SCRUB EZ  4% CHG (MISCELLANEOUS) ×4
BRUSH SCRUB EZ 4% CHG (MISCELLANEOUS) ×1 IMPLANT
CANISTER SUCT 1200ML W/VALVE (MISCELLANEOUS) ×3 IMPLANT
CANNULA 5F STIFF (CANNULA) ×2 IMPLANT
CLIP APPLIE 11 MED OPEN (CLIP) IMPLANT
CLIP APPLIE 9.375 SM OPEN (CLIP) IMPLANT
COUNTER SPONGE BAG EZ (MISCELLANEOUS)
COVER WAND RF STERILE (DRAPES) ×3 IMPLANT
DERMABOND ADVANCED (GAUZE/BANDAGES/DRESSINGS) ×4
DERMABOND ADVANCED .7 DNX12 (GAUZE/BANDAGES/DRESSINGS) ×1 IMPLANT
DRAPE INCISE IOBAN 66X45 STRL (DRAPES) ×3 IMPLANT
DRAPE ISOLATE BAG 20X20 STRL (DRAPES)
DRAPE LAPAROTOMY 100X77 ABD (DRAPES) ×3 IMPLANT
DURAPREP 26ML APPLICATOR (WOUND CARE) ×7 IMPLANT
ELECT CAUTERY BLADE 6.4 (BLADE) ×3 IMPLANT
ELECT REM PT RETURN 9FT ADLT (ELECTROSURGICAL) ×3
ELECTRODE REM PT RTRN 9FT ADLT (ELECTROSURGICAL) ×1 IMPLANT
GLOVE BIO SURGEON STRL SZ7 (GLOVE) ×6 IMPLANT
GLOVE INDICATOR 7.5 STRL GRN (GLOVE) ×3 IMPLANT
GOWN STRL REUS W/ TWL LRG LVL3 (GOWN DISPOSABLE) ×1 IMPLANT
GOWN STRL REUS W/ TWL XL LVL3 (GOWN DISPOSABLE) ×2 IMPLANT
GOWN STRL REUS W/TWL LRG LVL3 (GOWN DISPOSABLE) ×2
GOWN STRL REUS W/TWL XL LVL3 (GOWN DISPOSABLE) ×4
HEMOSTAT SURGICEL 2X3 (HEMOSTASIS) ×3 IMPLANT
IV NS 500ML (IV SOLUTION) ×2
IV NS 500ML BAXH (IV SOLUTION) ×1 IMPLANT
KIT PREVENA INCISION MGT 13 (CANNISTER) ×4 IMPLANT
KIT TURNOVER KIT A (KITS) ×3 IMPLANT
LABEL OR SOLS (LABEL) ×3 IMPLANT
LOOP RED MAXI  1X406MM (MISCELLANEOUS) ×6
LOOP VESSEL MAXI 1X406 RED (MISCELLANEOUS) ×2 IMPLANT
LOOP VESSEL MINI 0.8X406 BLUE (MISCELLANEOUS) ×2 IMPLANT
LOOPS BLUE MINI 0.8X406MM (MISCELLANEOUS) ×8
NS IRRIG 500ML POUR BTL (IV SOLUTION) ×3 IMPLANT
PACK BASIN MAJOR ARMC (MISCELLANEOUS) ×3 IMPLANT
PACK UNIVERSAL (MISCELLANEOUS) IMPLANT
PATCH CAROTID ECM VASC 1X10 (Prosthesis & Implant Heart) ×4 IMPLANT
STAPLER SKIN PROX 35W (STAPLE) ×2 IMPLANT
SUT PROLENE 5 0 RB 1 DA (SUTURE) ×6 IMPLANT
SUT PROLENE 6 0 BV (SUTURE) ×12 IMPLANT
SUT PROLENE 7 0 BV 1 (SUTURE) ×6 IMPLANT
SUT SILK 2 0 (SUTURE) ×2
SUT SILK 2-0 18XBRD TIE 12 (SUTURE) ×1 IMPLANT
SUT SILK 3 0 (SUTURE) ×2
SUT SILK 3-0 18XBRD TIE 12 (SUTURE) ×1 IMPLANT
SUT SILK 4 0 (SUTURE) ×2
SUT SILK 4-0 18XBRD TIE 12 (SUTURE) ×1 IMPLANT
SUT VIC AB 2-0 CT1 27 (SUTURE) ×4
SUT VIC AB 2-0 CT1 TAPERPNT 27 (SUTURE) ×2 IMPLANT
SUT VIC AB 3-0 SH 27 (SUTURE) ×2
SUT VIC AB 3-0 SH 27X BRD (SUTURE) ×1 IMPLANT
SUT VICRYL+ 3-0 36IN CT-1 (SUTURE) ×6 IMPLANT
SYR 20CC LL (SYRINGE) ×3 IMPLANT
SYR 5ML LL (SYRINGE) ×3 IMPLANT
TRAY FOLEY MTR SLVR 16FR STAT (SET/KITS/TRAYS/PACK) ×3 IMPLANT

## 2018-07-17 NOTE — Progress Notes (Addendum)
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  Allergies  Allergen Reactions  . Eggs Or Egg-Derived Products Diarrhea, Itching and Nausea And Vomiting    Patient Measurements: Height: 5\' 2"  (157.5 cm) Weight: 105 lb 6.1 oz (47.8 kg) IBW/kg (Calculated) : 50.1 Heparin Dosing Weight: 36.6 kg - reported by Jacqulyn Cane RN  Vital Signs: Temp: 97.8 F (36.6 C) (01/16 1843) Temp Source: Tympanic (01/16 1325) BP: 112/57 (01/16 1843) Pulse Rate: 96 (01/16 1843)  Labs: Recent Labs    07/16/18 0331 07/16/18 1439 07/16/18 1737 07/17/18 0215 07/17/18 0526 07/17/18 1123  HGB  --   --  7.4*  --  7.7*  --   HCT  --   --  24.7*  --  26.4*  --   PLT  --   --  161  --  185  --   APTT  --   --   --   --  73*  --   LABPROT  --   --   --   --  14.8  --   INR  --   --   --   --  1.17  --   HEPARINUNFRC 0.36  --   --   --  0.21* <0.10*  CREATININE 0.66  --  0.76  --  1.04*  --   TROPONINI  --  <0.03 0.11* 1.19*  --   --     Estimated Creatinine Clearance: 41.8 mL/min (A) (by C-G formula based on SCr of 1.04 mg/dL (H)).   Assessment: Pharmacy was consulted for heparin.  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 Will follow aPTT.   Goal of Therapy:  APTT level 66-102 seconds or Heparin level 0.3-0.7 units/ml once heparin level correlates with aPTT  Monitor platelets by anticoagulation protocol: Yes  Heparin Course: 1/13 AM heparin level 0.24. 600 unit  bolus and increase rate t o 850 units/hr. Recheck in 6 hours.  1/13:  HL @ 2055 = 0.17 .   Heparin was not held during any point on 1/13, angiogram was not done.   Will order heparin 1750 units IV X 1 once and increase drip rate to 1050 units/hr.  Will recheck HL 6 hrs after rate change.  1/14 Patient had angiogram with heparin on hold.  Plan:  1/16 - Heparin held due to  procedure and restarted at 1947 at previous rate without bolus.  Per RN Dr. Wyn Quaker would like hold boluses until reassessment 1/17. Heparin rate 1150 units/hr Will draw heparin level every 6 hours until two consecutive therapeutic levels then daily and CBC daily.  Orinda Kenner, PharmD Clinical Pharmacist 07/17/2018,7:56 PM

## 2018-07-17 NOTE — H&P (Signed)
[facility] History & Physical Update  The patient was interviewed and re-examined.  The patient's previous History and Physical has been reviewed and is unchanged.  There is no change in the plan of care. We plan to proceed with the scheduled procedure.  Although the patient is very ill, she is at very high risk of limb loss and if surgical intervention is not performed to improve her perfusion in the near future, limb loss would be expected.  As such, after discussions with the medical team and anesthesiology, proceeding with bilateral femoral endarterectomies at this time gives [place] the best chance of limb salvage.  The patient is clearly a high risk surgical patient, but waiting several weeks for her to recover would likely lead to limb loss.  Festus Barren, MD  07/17/2018, 1:28 PM

## 2018-07-17 NOTE — Progress Notes (Signed)
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  Allergies  Allergen Reactions  . Eggs Or Egg-Derived Products Diarrhea, Itching and Nausea And Vomiting    Patient Measurements: Height: 5\' 2"  (157.5 cm) Weight: 105 lb 6.1 oz (47.8 kg) IBW/kg (Calculated) : 50.1 Heparin Dosing Weight: 36.6 kg - reported by Jacqulyn Cane RN  Vital Signs: Temp: 98.4 F (36.9 C) (01/16 0508) Temp Source: Oral (01/16 0508) BP: 112/63 (01/16 0508) Pulse Rate: 96 (01/16 0508)  Labs: Recent Labs    07/15/18 2356 07/16/18 0331 07/16/18 1439 07/16/18 1737 07/17/18 0215 07/17/18 0526  HGB  --   --   --  7.4*  --  7.7*  HCT  --   --   --  24.7*  --  26.4*  PLT  --   --   --  161  --  185  APTT  --   --   --   --   --  73*  LABPROT  --   --   --   --   --  14.8  INR  --   --   --   --   --  1.17  HEPARINUNFRC 0.39 0.36  --   --   --  0.21*  CREATININE  --  0.66  --  0.76  --  1.04*  TROPONINI  --   --  <0.03 0.11* 1.19*  --     Estimated Creatinine Clearance: 41.8 mL/min (A) (by C-G formula based on SCr of 1.04 mg/dL (H)).   Assessment: Pharmacy was consulted for heparin.  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 Will follow aPTT.   Goal of Therapy:  APTT level 66-102 seconds or Heparin level 0.3-0.7 units/ml once heparin level correlates with aPTT  Monitor platelets by anticoagulation protocol: Yes  Heparin Course: 1/13 AM heparin level 0.24. 600 unit  bolus and increase rate t o 850 units/hr. Recheck in 6 hours.  1/13:  HL @ 2055 = 0.17 .   Heparin was not held during any point on 1/13, angiogram was not done.   Will order heparin 1750 units IV X 1 once and increase drip rate to 1050 units/hr.  Will recheck HL 6 hrs after rate change.  1/14 Patient had angiogram with heparin on hold.  Plan:  01/15 @ 0530 HL 0.21 subtherapeutic.  Will increase rate to 1150 units/hr without bolus since Hgb is 7.4 >> 7.7, will recheck HL @ 1100 will continue to monitor.  Thomasene Ripple, PharmD Clinical Pharmacist 07/17/2018,6:49 AM

## 2018-07-17 NOTE — Progress Notes (Signed)
*  PRELIMINARY RESULTS* Echocardiogram 2D Echocardiogram has been performed.  Cristela Blue 07/17/2018, 9:05 AM

## 2018-07-17 NOTE — Progress Notes (Signed)
PT Cancellation Note  Patient Details Name: April [name] MRN: [id number] DOB: 08/23/1955   Cancelled Treatment:    Reason Eval/Treat Not Completed: Patient at procedure or test/unavailable.  Pt currently off floor for procedure.  Will re-attempt PT treatment session at a later date/time as medically appropriate.  Hendricks Limes, PT 07/17/18, 1:45 PM [phone]

## 2018-07-17 NOTE — Anesthesia Post-op Follow-up Note (Signed)
Anesthesia QCDR form completed.        

## 2018-07-17 NOTE — Progress Notes (Signed)
Dr. Katheren ShamsSalary consulted in regards to holding am Lopressor: BP 112/63, as not to "bottom out" patient this am; acknowledged and agreed.  Lopressor held this am. Windy Carinaurner,Regena Delucchi K, RN 5:34 AM 07/17/2018

## 2018-07-17 NOTE — Anesthesia Preprocedure Evaluation (Addendum)
Anesthesia Evaluation  Patient identified by MRN, date of birth, ID band Patient awake    Reviewed: Allergy & Precautions, NPO status , Patient's Chart, lab work & pertinent test results, reviewed documented beta blocker date and time   Airway Mallampati: II  TM Distance: >3 FB     Dental  (+) Upper Dentures, Lower Dentures   Pulmonary COPD, Current Smoker,           Cardiovascular + Peripheral Vascular Disease       Neuro/Psych Anxiety    GI/Hepatic   Endo/Other  diabetes, Type 2  Renal/GU      Musculoskeletal   Abdominal   Peds  Hematology   Anesthesia Other Findings Very sick pt for surgery this afternoon. This is an emergency. We need to proceed to avoid limb loss according to Dr. Wyn Quaker. At present she has no chest pain or CHF symptoms.   Reproductive/Obstetrics                            Anesthesia Physical Anesthesia Plan  ASA: IV  Anesthesia Plan: General   Post-op Pain Management:    Induction: Intravenous  PONV Risk Score and Plan:   Airway Management Planned: Oral ETT  Additional Equipment:   Intra-op Plan:   Post-operative Plan:   Informed Consent: I have reviewed the patients History and Physical, chart, labs and discussed the procedure including the risks, benefits and alternatives for the proposed anesthesia with the patient or authorized representative who has indicated his/her understanding and acceptance.       Plan Discussed with: CRNA  Anesthesia Plan Comments:         Anesthesia Quick Evaluation

## 2018-07-17 NOTE — Progress Notes (Signed)
Pt did arrive to the CCU with personal belongings.  Staff from [facility] unable to locate.

## 2018-07-17 NOTE — Anesthesia Postprocedure Evaluation (Signed)
Anesthesia Post Note  Patient: April [name]  Procedure(s) Performed: ENDARTERECTOMY FEMORAL - BILATERAL SFA STENT PLACEMENT - LEFT (Bilateral )  Patient location during evaluation: PACU Anesthesia Type: General Level of consciousness: awake and alert Pain management: pain level controlled Vital Signs Assessment: post-procedure vital signs reviewed and stable Respiratory status: spontaneous breathing and respiratory function stable Cardiovascular status: stable Anesthetic complications: no     Last Vitals:  Vitals:   07/17/18 1325 07/17/18 1745  BP: (!) 92/52 (!) 111/59  Pulse: 83 (!) 102  Resp:  16  Temp: (!) 36.4 C 36.6 C  SpO2: 98% 97%    Last Pain:  Vitals:   07/17/18 1745  TempSrc:   PainSc: Asleep                 KEPHART,WILLIAM K

## 2018-07-17 NOTE — Progress Notes (Signed)
Dr. Katheren ShamsSalary notified of elevated troponin of 1.19, admitting Dx and recent RRT; acknowledged, new orders written for echo in am and Cardiology consult in am. Windy Carinaurner,Jenascia Bumpass K, RN. 3:02 AM 07/17/2018

## 2018-07-17 NOTE — Anesthesia Procedure Notes (Signed)
Arterial Line Insertion Start/End1/16/2020 2:33 PM, 07/17/2018 2:37 PM Performed by: Berdine Addison, MD, Omer Jack, CRNA, CRNA  Patient location: OR. Preanesthetic checklist: patient identified, IV checked, site marked, risks and benefits discussed, surgical consent, monitors and equipment checked, pre-op evaluation, timeout performed and anesthesia consent Lidocaine 1% used for infiltration and patient sedated Left, radial was placed Catheter size: 20 G Hand hygiene performed , maximum sterile barriers used  and Seldinger technique used  Attempts: 1 Procedure performed without using ultrasound guided technique. Ultrasound Notes:anatomy identified Following insertion, dressing applied. Post procedure assessment: normal  Patient tolerated the procedure well with no immediate complications. Additional procedure comments: Procedure performed by K. Cox, SRNA.Marland Kitchen

## 2018-07-17 NOTE — Transfer of Care (Signed)
Immediate Anesthesia Transfer of Care Note  Patient: April [name]  Procedure(s) Performed: ENDARTERECTOMY FEMORAL - BILATERAL SFA STENT PLACEMENT - LEFT (Bilateral )  Patient Location: PACU  Anesthesia Type:General  Level of Consciousness: awake  Airway & Oxygen Therapy: Patient Spontanous Breathing and Patient connected to face mask oxygen  Post-op Assessment: Report given to RN and Post -op Vital signs reviewed and stable  Post vital signs: Reviewed  Last Vitals:  Vitals Value Taken Time  BP 89/47 07/17/2018  5:45 PM  Temp    Pulse 105 07/17/2018  5:45 PM  Resp 15 07/17/2018  5:45 PM  SpO2 98 % 07/17/2018  5:45 PM  Vitals shown include unvalidated device data.  Last Pain:  Vitals:   07/17/18 1325  TempSrc: Tympanic  PainSc:       Patients Stated Pain Goal: 0 (07/16/18 0645)  Complications: No apparent anesthesia complications

## 2018-07-17 NOTE — Consult Note (Addendum)
Name: April [name] MRN: [id number] DOB: 07-Sep-1955    ADMISSION DATE:  07/09/2018 CONSULTATION DATE:  07/17/2018  REFERRING MD :  Dr. Wyn Quaker  CHIEF COMPLAINT:  Bilateral Diabetic Foot Infections  BRIEF PATIENT DESCRIPTION:  63 y.o. Female with PMH notable for severe PVD, uncontrolled Diabetes who presents with bilateral infected diabetic foot ulcers with questionable osteomyelitis of R foot.  Podiatry has performed debridement of right foot. Wound culture with MRSA, ID following. Vascular surgery has performed right angioplasty and stent to right femoral artery, and performed bilateral femoral endarterectomies. Hospital course has been complicated by NSTEMI, of which Cardiology recommends medical management. She  Returns to ICU post femoral endarterectomies, with new tachycardia and soft BP's, meets SIRS criteria.  Will pan culture and obtain CXR.  SIGNIFICANT EVENTS  07/09/18>> Admission to [facility] Med[facility]Surg unit 07/11/18>> Podiatry performed debridement of R foot with excision of the 1st distal R metatarsal 07/15/18>> Angioplasty with stent placement of R superficial femoral artery 07/17/18>> NSTEMI, Cardiology recommends medical management 07/17/18>> Bilateral femoral endarterectomies, returns to ICU post procedure  STUDIES:  DG R Foot 1/9>> Deep penetrating wound at the level of the first metatarsophalangeal joint, with cortical irregularity of the base of the first proximal phalanx, suggestive possible osteomyelitis.  CULTURES: Wound culture 07/11/18>> MRSA Blood 1/17>20>> Urine 07/18/18>> Sputum 07/18/18>>  ANTIBIOTICS: Cefazolin 1/13>>1/15, 1/16 x 1 dose in OR Zosyn 1/8>>1/12 Vancomycin 1/8>>  HISTORY OF PRESENT ILLNESS:   Ms. April [name] is a 63 y.o. 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   She reported pain in both lower extremities, and drainage from her right foot wound.  She denied fever or chills, and did not meet Sepsis criteria.  She was admitted to [facility] Med[facility].  She was placed on Broad spectrum antibiotics, with consults to Podiatry and Vascular surgery.  On 1/10 Podiatry performed debridement of the right foot with excision of the first distal right metatarsal, and placement of wound VAC.  Wound culture was positive for MRSA, ID consulted.  ID recommends extended course of Vancomycin.  On 1/14 Vascular Surgery performed angioplasty with stent placement of the right superficial femoral artery.  She was noted to have severe left common femoral lesion, with plans for bilateral femoral endarterectomies.  Her hospital course has been complicated by NSTEMI, of which Cardiology recommends medical management.  On 1/16 she underwent bilateral femoral enterectomies, and she returns to ICU post procedure.  She is currently tachycardic with soft Blood pressures, on 2L nasal cannula, and in no acute distress.  She remains on Heparin drip for NSTEMI and hx of DVT, with maintenance fluids infusing.  PCCM is consulted for further management.  PAST MEDICAL HISTORY :   has a past medical history of Anxiety, Colon polyps, COPD (chronic obstructive pulmonary disease) (HCC), Diabetes mellitus without complication (HCC), DVT (deep venous thrombosis) (HCC), DVT (deep venous thrombosis) (HCC), History of colonic polyps, Hyperlipidemia, Peripheral artery disease (HCC), Pulmonary nodules/lesions, multiple (08/2012), Retroperitoneal abscess (HCC), and Tobacco abuse.  has a past surgical history that includes Colonoscopy (05/2007); Cholecystectomy; Irrigation and debridement foot (Bilateral, 05/25/2018); Lower Extremity Angiography (Left, 05/26/2018); Lower Extremity Angiography (Right, 05/27/2018); Irrigation and debridement foot (Right, 07/11/2018); and Application if wound vac (Right, 07/11/2018). Prior to  Admission medications   Medication Sig Start Date End Date Taking?  Authorizing Provider  albuterol (PROVENTIL HFA;VENTOLIN HFA) 108 (90 Base) MCG/ACT inhaler Inhale 2 puffs into the lungs every 6 (six) hours as needed for wheezing or shortness of breath.    [provider]  albuterol (PROVENTIL) (2.5 MG/3ML) 0.083% nebulizer solution Inhale 2.5 mg into the lungs every 6 (six) hours as needed for wheezing or shortness of breath.     [provider]  ALPRAZolam Prudy Feeler) 0.5 MG tablet Take 1 tablet (0.5 mg total) by mouth 2 (two) times daily as needed for anxiety. 06/13/18   Altamese Dilling, MD  aspirin 81 MG chewable tablet Chew 81 mg by mouth daily. 05/30/17   [provider]  atorvastatin (LIPITOR) 40 MG tablet Take 40 mg by mouth daily. 10/24/17 10/24/18  [provider]  ferrous sulfate 325 (65 FE) MG tablet Take 1 tablet (325 mg total) by mouth 2 (two) times daily with a meal. 06/01/18 07/01/18  Pyreddy, Vivien Rota, MD  Fluticasone-Salmeterol (ADVAIR DISKUS) 250-50 MCG/DOSE AEPB Inhale 1 puff into the lungs 2 (two) times daily. 03/14/15 01/16/26  Beers, Charmayne Sheer, PA-C  furosemide (LASIX) 40 MG tablet Take 1 tablet (40 mg total) by mouth daily. 06/14/18   Altamese Dilling, MD  guaiFENesin-dextromethorphan (ROBITUSSIN DM) 100-10 MG/5ML syrup Take 5 mLs by mouth every 4 (four) hours as needed for cough. 04/11/18   Milagros Loll, MD  HYDROcodone-acetaminophen (NORCO/VICODIN) 5-325 MG tablet Take 1-2 tablets by mouth every 6 (six) hours as needed for moderate pain or severe pain. 06/01/18   Ihor Austin, MD  insulin glargine (LANTUS) 100 unit/mL SOPN Inject 0.1 mLs (10 Units total) into the skin daily. 06/13/18   Altamese Dilling, MD  metFORMIN (GLUCOPHAGE) 500 MG tablet Take 1 tablet (500 mg total) by mouth 2 (two) times daily with a meal. 06/13/18   Altamese Dilling, MD  pantoprazole (PROTONIX) 40 MG tablet Take 40 mg by mouth 2 (two) times daily.   04/25/18   [provider]  polyethylene glycol (MIRALAX / GLYCOLAX) packet Take 17 g by mouth daily. 06/14/18   Altamese Dilling, MD  potassium chloride (K-DUR,KLOR-CON) 10 MEQ tablet Take 1 tablet (10 mEq total) by mouth daily. 06/13/18   Altamese Dilling, MD  protein supplement shake (PREMIER PROTEIN) LIQD Take 325 mLs (11 oz total) by mouth 2 (two) times daily between meals. 06/14/18   Altamese Dilling, MD  rivaroxaban (XARELTO) 10 MG TABS tablet Take 10 mg by mouth every evening.    [provider]  SANTYL ointment Apply 1 application topically daily.  05/19/18   [provider]  sodium hypochlorite (DAKIN'S 1/4 STRENGTH) 0.125 % SOLN Apply 1 application topically daily. Use as a wound cleanser, pat dry, then apply dressings 12/18/17   [provider]  SPIRIVA HANDIHALER 18 MCG inhalation capsule Place 18 mcg into inhaler and inhale daily.  05/19/18   [provider]  tamsulosin (FLOMAX) 0.4 MG CAPS capsule Take 1 capsule (0.4 mg total) by mouth daily. 06/14/18   Altamese Dilling, MD  vitamin C (VITAMIN C) 500 MG tablet Take 1 tablet (500 mg total) by mouth 2 (two) times daily. 06/13/18   Altamese Dilling, MD   Allergies  Allergen Reactions  . Eggs Or Egg-Derived Products Diarrhea, Itching and Nausea And Vomiting    FAMILY HISTORY:  family history includes Brain cancer in her mother. SOCIAL HISTORY:  reports that she has been smoking cigarettes. She has a 70.50 pack-year smoking history. She has never used smokeless tobacco. She reports that she does not drink  alcohol or use drugs.  REVIEW OF SYSTEMS:  Positives in BOLD>> Pt denies all complaints Constitutional: Negative for fever, chills, weight loss, malaise/fatigue and diaphoresis.  HENT: Negative for hearing loss, ear pain, nosebleeds, congestion, sore throat, neck pain, tinnitus and ear discharge.   Eyes: Negative for blurred vision, double vision,  photophobia, pain, discharge and redness.  Respiratory: Negative for cough, hemoptysis, sputum production, shortness of breath, wheezing and stridor.   Cardiovascular: Negative for chest pain, palpitations, orthopnea, claudication, leg swelling and PND.  Gastrointestinal: Negative for heartburn, nausea, vomiting, abdominal pain, diarrhea, constipation, blood in stool and melena.  Genitourinary: Negative for dysuria, urgency, frequency, hematuria and flank pain.  Musculoskeletal: Negative for myalgias, back pain, joint pain and falls.  Skin: Negative for itching and rash.  Neurological: Negative for dizziness, tingling, tremors, sensory change, speech change, focal weakness, seizures, loss of consciousness, weakness and headaches.  Endo/Heme/Allergies: Negative for environmental allergies and polydipsia. Does not bruise/bleed easily.  SUBJECTIVE:  Denies chest pain, shortness of breath, cough, sputum production Denies fever, chills, or pain On Nasal cannula A-line in place with soft BP's  VITAL SIGNS: Temp:  [97.5 F (36.4 C)-98.4 F (36.9 C)] 97.7 F (36.5 C) (01/16 2000) Pulse Rate:  [83-108] 105 (01/16 2200) Resp:  [10-18] 12 (01/16 2200) BP: (92-119)/(48-67) 94/67 (01/16 2200) SpO2:  [92 %-100 %] 93 % (01/16 2200) Arterial Line BP: (92-111)/(45-52) 92/45 (01/16 2200)  PHYSICAL EXAMINATION: General:  Acutely ill appearing female, laying in bed, on nasal cannula, in NAD Neuro:  Sleeping, arouses to voice, A&O x3, Follows commands, no focal deficits, speech clear HEENT: Atraumatic, normocephalic, neck supple, no JVD Cardiovascular:  Tachycardia, regular rhythm, s1s2, no M/R/G, 2+ radial pulses, dopplerable pedal pulses Lungs:  Clear to right, diminished on left, even, nonlabored, normal effort Abdomen:  Soft, nontender, nondistended, no guarding or rebound tenderness, BS hypoactive Musculoskeletal:  Normal bulk and tone, no edema Skin:  Bilateral femoral incisions with wound vac in  place, wound vac in place to R foot  Recent Labs  Lab 07/16/18 0331 07/16/18 1737 07/17/18 0526  NA 136 135 134*  K 4.4 4.9 4.7  CL 104 101 99  CO2 27 26 25   BUN 23 23 30*  CREATININE 0.66 0.76 1.04*  GLUCOSE 226* 152* 260*   Recent Labs  Lab 07/14/18 0506 07/16/18 1737 07/17/18 0526  HGB 8.0* 7.4* 7.7*  HCT 26.3* 24.7* 26.4*  WBC 8.2 13.0* 11.1*  PLT 193 161 185   Dg Chest Port 1 View  Result Date: 07/16/2018 CLINICAL DATA:  Shortness of breath, coughing up yellow sputum, history COPD, diabetes mellitus, smoker EXAM: PORTABLE CHEST 1 VIEW COMPARISON:  Portable exam 1416 hours compared to 06/11/2018 FINDINGS: Normal heart size and mediastinal contours. Question mild pulmonary vascular congestion. Atherosclerotic calcification aorta. Diffuse interstitial prominence and scattered Kerley B-lines at the mid to lower lungs question pulmonary edema. Decreased bibasilar effusions and atelectasis versus previous exam. Question subtle developing infiltrate RIGHT upper lobe. No pneumothorax. Bones demineralized. IMPRESSION: Question subtle RIGHT upper lobe infiltrate. Improved bibasilar pleural effusions and atelectasis. Persistent interstitial prominence in mid to lower lungs suggesting minimal pulmonary edema. Electronically Signed   By: Ulyses SouthwardMark  Boles M.D.   On: 07/16/2018 14:39   Dg C-arm 1-60 Min-no Report  Result Date: 07/17/2018 Fluoroscopy was utilized by the requesting physician.  No radiographic interpretation.    ASSESSMENT / PLAN:  Bilateral infected Diabetic foot wounds, with right foot osteomyelitis Wound cultures w/ MRSA Meets SIRS Criteria (with new tachycardia  and hypotension) Severe Peripheral Vascular Disease -Monitor fever curve -Trend WBC's and Procalcitonin -Seen by ID, recommends extended course of Vancomycin -Obtain Pan cultures -Obtain CXR -Continue Vancomycin, consider adding Cefepime pending CXR and culture results -Podiatry and Vascular surgery following,  appreciate input -S/p debridement of R foot on 1/10 -S/p angioplasty with stent placement of the right superficial femoral artery on 1/14 -S/p bilateral femoral enterectomies on 1/16  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 Hx: PAD, HLD -Cardiac monitoring -Maintain MAP >65  -Continue Midodrine -Levophed if needed to maintain MAP goal -IVF: NS @ 100 ml/r -Heparin drip -Cardiology following, Cardiology recommends medical management -Echocardiogram pending  AKI -Monitor I&O's / urinary output -Follow BMP -Ensure adequate renal perfusion -Avoid nephrotoxic agents as able -Replace electrolytes as indicated  COPD without acute Exacerbation CXR on 1/15 with questionable RUL infiltrate  Hx: Smoker -Supplemental O2 as needed to maintain O2 sats 88 to 94% -Intermittent CXR and ABG as needed, obtain CXR 1/17 -Obtain sputum culture -Continue Dulera and Spiriva -Prn Bronchodilators -Incentive Spirometry  Diabetes Mellitus with Hyperglycemia -CBG's -SSI and Lantus -Follow ICU Hypo/hyperglycemia protocol -Will D/c D5NS to NS  Anemia Monitor for S/Sx of bleeding Trend CBC Heparin drip for VTE Prophylaxis  Transfuse for Hgb <7    DISPOSITION: ICU GOALS OF CARE: Partial code: No intubation VTE PROPHYLAXIS: Heparin drip UPDATES: Updated pt at bedside 07/18/18   Harlon DittyJeremiah , [facility] LLCGACNP-BC [employer] [facility] Pager: [phone] Cell: [phone]  07/18/2018, 1:17 AM

## 2018-07-17 NOTE — Progress Notes (Signed)
Suction right groin wound vac not intact.  Notified Dr Wyn Quaker.  Will try to repair leak.

## 2018-07-17 NOTE — Anesthesia Procedure Notes (Signed)
Procedure Name: Intubation Date/Time: 07/17/2018 2:41 PM Performed by: Omer Jack, CRNA Pre-anesthesia Checklist: Patient identified, Patient being monitored, Timeout performed, Emergency Drugs available and Suction available Patient Re-evaluated:Patient Re-evaluated prior to induction Oxygen Delivery Method: Circle system utilized Preoxygenation: Pre-oxygenation with 100% oxygen Induction Type: IV induction Ventilation: Mask ventilation without difficulty Laryngoscope Size: Miller and 2 Grade View: Grade I Tube type: Oral Tube size: 7.0 mm Number of attempts: 1 Airway Equipment and Method: Stylet Placement Confirmation: ETT inserted through vocal cords under direct vision,  positive ETCO2 and breath sounds checked- equal and bilateral Secured at: 21 cm Tube secured with: Tape Dental Injury: Teeth and Oropharynx as per pre-operative assessment

## 2018-07-17 NOTE — Progress Notes (Signed)
[facility] - [place] at [facility]   PATIENT NAME: April [name]    MR#:  [id number]  DATE OF BIRTH:  [birth year]  SUBJECTIVE:  CHIEF COMPLAINT: This morning feeling better heart rate improved no nausea vomiting denies any chest  REVIEW OF SYSTEMS:  CONSTITUTIONAL: No fever, fatigue or weakness.  EYES: No blurred or double vision.  EARS, NOSE, AND THROAT: No tinnitus or ear pain.  RESPIRATORY: No cough, shortness of breath, wheezing or hemoptysis.  CARDIOVASCULAR: No chest pain, orthopnea, edema.  GASTROINTESTINAL: No nausea, vomiting, diarrhea or abdominal pain.  GENITOURINARY: No dysuria, hematuria.  ENDOCRINE: No polyuria, nocturia,  HEMATOLOGY: No anemia, easy bruising or bleeding SKIN: Right foot in dressing and has left heel ulcer MUSCULOSKELETAL: No joint pain or arthritis.   NEUROLOGIC: No tingling, numbness, weakness.  PSYCHIATRY: No anxiety or depression.   DRUG ALLERGIES:   Allergies  Allergen Reactions  . Eggs Or Egg-Derived Products Diarrhea, Itching and Nausea And Vomiting    VITALS:  Blood pressure (!) 92/52, pulse 83, temperature (!) 97.5 F (36.4 C), temperature source Tympanic, resp. rate 18, height 5\' 2"  (1.575 m), weight 47.8 kg, SpO2 98 %.  PHYSICAL EXAMINATION:  GENERAL:  63 y.o.-year-old patient lying in the bed with no acute distress.  EYES: Pupils equal, round, reactive to light and accommodation. No scleral icterus. Extraocular muscles intact.  HEENT: Head atraumatic, normocephalic. Oropharynx and nasopharynx clear.  NECK:  Supple, no jugular venous distention. No thyroid enlargement, no tenderness.  LUNGS: Normal breath sounds bilaterally, no wheezing, rales,rhonchi or crepitation. No use of accessory muscles of respiration.  CARDIOVASCULAR: S1, S2 normal. No murmurs, rubs, or gallops.  ABDOMEN: Soft, nontender, nondistended. Bowel sounds present.  EXTREMITIES: No pedal edema, cyanosis, or clubbing.  NEUROLOGIC: Awake,  alert and oriented x3 sensation intact. Gait not checked.  PSYCHIATRIC: The patient is alert and oriented x 3.  SKIN: Right foot with wound VAC left heel ulcer with necrosis  LABORATORY PANEL:   CBC Recent Labs  Lab 07/17/18 0526  WBC 11.1*  HGB 7.7*  HCT 26.4*  PLT 185   ------------------------------------------------------------------------------------------------------------------  Chemistries  Recent Labs  Lab 07/17/18 0526  NA 134*  K 4.7  CL 99  CO2 25  GLUCOSE 260*  BUN 30*  CREATININE 1.04*  CALCIUM 8.3*  MG 2.1   ------------------------------------------------------------------------------------------------------------------  Cardiac Enzymes Recent Labs  Lab 07/17/18 0215  TROPONINI 1.19*   ------------------------------------------------------------------------------------------------------------------  RADIOLOGY:  Dg Chest Port 1 View  Result Date: 07/16/2018 CLINICAL DATA:  Shortness of breath, coughing up yellow sputum, history COPD, diabetes mellitus, smoker EXAM: PORTABLE CHEST 1 VIEW COMPARISON:  Portable exam 1416 hours compared to 06/11/2018 FINDINGS: Normal heart size and mediastinal contours. Question mild pulmonary vascular congestion. Atherosclerotic calcification aorta. Diffuse interstitial prominence and scattered Kerley B-lines at the mid to lower lungs question pulmonary edema. Decreased bibasilar effusions and atelectasis versus previous exam. Question subtle developing infiltrate RIGHT upper lobe. No pneumothorax. Bones demineralized. IMPRESSION: Question subtle RIGHT upper lobe infiltrate. Improved bibasilar pleural effusions and atelectasis. Persistent interstitial prominence in mid to lower lungs suggesting minimal pulmonary edema. Electronically Signed   By: Ulyses Southward M.D.   On: 07/16/2018 14:39    EKG:   Orders placed or performed during the hospital encounter of 07/09/18  . EKG 12-Lead  . EKG 12-Lead  . EKG 12-Lead  . EKG  12-Lead  . EKG 12-Lead  . EKG 12-Lead    ASSESSMENT AND PLAN:     #1 bilateral  infected diabetic foot wounds-  right foot osteomyelitis  Status post evaluation by podiatry As well as vascular surgery Patient with severe peripheral vascular disease Continue vancomycin  #2  peripheral vascular disease Seen by vascular surgery further treatment for vascular surgery today  #3 non-ST MI patient not candidate for any type of invasive cardiac procedure according to cardiology due to anemia and other multiple issues going on Plan to medically manage continue   #4recurrent left lower extremity DVT-on Xarelto at home. -Continue heparin  #5 COPD-stable, no signs of acute exacerbation -Continue home inhalers  #6 uncontrolled type 2 diabetes- recent A1c 10.9. -Continue Lantus will need adjustment  #7 hyperlipidemia-stable -Continue home statin  #8 anxiety-stable -Continue home Xanax  #9 severe protein-calorie malnutrition -Continue home Premier protein twice daily between meals  #10 head lice-that is post treatment permethrin  #11 tobacco abuse -Nicotine patch as needed  All the records are reviewed and case discussed with Care Management/Social Workerr. Management plans discussed with the patient, daughter at bedside and they are in agreement  CODE STATUS: fc  TOTAL TIME TAKING CARE OF THIS PATIENT: 33 minutes.   POSSIBLE D/C IN 2 DAYS, DEPENDING ON CLINICAL CONDITION.  Note: This dictation was prepared with Dragon dictation along with smaller phrase technology. Any transcriptional errors that result from this process are unintentional.   Auburn Bilberry M.D on 07/17/2018 at 3:43 PM  Between 7am to 6pm - Pager - [phone] After 6pm go to [email or web address] - password [email or web address] [facility]  [facility]  Office  [phone]  CC: Primary care physician; Mickel Fuchs, MD

## 2018-07-17 NOTE — Consult Note (Signed)
[facility] Cardiology Consultation Note  Patient ID: April [name], MRN: [id number], DOB/AGE: 10-13-1955 63 y.o. Admit date: 07/09/2018   Date of Consult: 07/17/2018 Primary Physician: Mickel Fuchs, MD   Chief Complaint: No chief complaint on file.  Reason for Consult: Elevated troponin and tachycardia  HPI: 63 y.o. female with known significant diabetes with complication essential hypertension mixed hyperlipidemia significant peripheral vascular disease with diabetic foot infections requiring treatment and recurrent deep venous thrombosis on anticoagulation now with significant anemia having admission for further treatment of peripheral vascular disease.  The patient ended up with a significant somnolence and difficulty with obtundation as well as some tachycardia.  Was not clear whether this was multifactorial or secondary to supraventricular tachycardia or possible pulmonary embolism.  The patient has been on Xarelto before for anticoagulation and currently has been on heparin during her hospital stay.  The patient still is significantly anemic with the symptoms of tachycardia and also has an elevated troponin of 1.1 consistent with demand ischemia and/or non-ST elevation myocardial infarction due to current conditions.  The patient has a chest x-ray showing minimal oral effusions and some pulmonary edema.  EKG has shown normal sinus rhythm with possible inferior infarct age undetermined and lateral T wave inversions.  Currently the patient is much more comfortable but still slightly obtunded and more sleepy with no evidence of distress  Past Medical History:  Diagnosis Date  . Anxiety   . Colon polyps   . COPD (chronic obstructive pulmonary disease) (HCC)   . Diabetes mellitus without complication (HCC)   . DVT (deep venous thrombosis) (HCC)   . DVT (deep venous thrombosis) (HCC)   . History of colonic polyps   . Hyperlipidemia   . Peripheral artery disease (HCC)   . Pulmonary  nodules/lesions, multiple 08/2012   NEGATIVE PET SCAN  . Retroperitoneal abscess (HCC)    history of in 04/2013  . Tobacco abuse       Surgical History:  Past Surgical History:  Procedure Laterality Date  . APPLICATION OF WOUND VAC Right 07/11/2018   Procedure: APPLICATION OF WOUND VAC;  Surgeon: Gwyneth Revels, DPM;  Location: [facility] ORS;  Service: Podiatry;  Laterality: Right;  . CHOLECYSTECTOMY    . COLONOSCOPY  05/2007  . IRRIGATION AND DEBRIDEMENT FOOT Bilateral 05/25/2018   Procedure: IRRIGATION AND DEBRIDEMENT FOOT application wound vac left  foot;  Surgeon: Recardo Evangelist, DPM;  Location: [facility] ORS;  Service: Podiatry;  Laterality: Bilateral;  . IRRIGATION AND DEBRIDEMENT FOOT Right 07/11/2018   Procedure: FOOT DEBRIDEMENT;  Surgeon: Gwyneth Revels, DPM;  Location: [facility] ORS;  Service: Podiatry;  Laterality: Right;  . LOWER EXTREMITY ANGIOGRAPHY Left 05/26/2018   Procedure: Lower Extremity Angiography with possible intervention;  Surgeon: Annice Needy, MD;  Location: [facility] INVASIVE CV LAB;  Service: Cardiovascular;  Laterality: Left;  . LOWER EXTREMITY ANGIOGRAPHY Right 05/27/2018   Procedure: Lower Extremity Angiography;  Surgeon: Renford Dills, MD;  Location: [facility] INVASIVE CV LAB;  Service: Cardiovascular;  Laterality: Right;     Home Meds: Prior to Admission medications   Medication Sig Start Date End Date Taking? Authorizing Provider  albuterol (PROVENTIL HFA;VENTOLIN HFA) 108 (90 Base) MCG/ACT inhaler Inhale 2 puffs into the lungs every 6 (six) hours as needed for wheezing or shortness of breath.    [provider]  albuterol (PROVENTIL) (2.5 MG/3ML) 0.083% nebulizer solution Inhale 2.5 mg into the lungs every 6 (six) hours as needed for wheezing or shortness of breath.  [provider]  ALPRAZolam Prudy Feeler(XANAX) 0.5 MG tablet Take 1 tablet (0.5 mg total) by mouth 2 (two) times daily as needed for anxiety. 06/13/18   Altamese DillingVachhani, Vaibhavkumar, MD  aspirin 81 MG  chewable tablet Chew 81 mg by mouth daily. 05/30/17   [provider]  atorvastatin (LIPITOR) 40 MG tablet Take 40 mg by mouth daily. 10/24/17 10/24/18  [provider]  ferrous sulfate 325 (65 FE) MG tablet Take 1 tablet (325 mg total) by mouth 2 (two) times daily with a meal. 06/01/18 07/01/18  Pyreddy, Vivien RotaPavan, MD  Fluticasone-Salmeterol (ADVAIR DISKUS) 250-50 MCG/DOSE AEPB Inhale 1 puff into the lungs 2 (two) times daily. 03/14/15 01/16/26  Beers, Charmayne Sheerharles M, PA-C  furosemide (LASIX) 40 MG tablet Take 1 tablet (40 mg total) by mouth daily. 06/14/18   Altamese DillingVachhani, Vaibhavkumar, MD  guaiFENesin-dextromethorphan (ROBITUSSIN DM) 100-10 MG/5ML syrup Take 5 mLs by mouth every 4 (four) hours as needed for cough. 04/11/18   Milagros LollSudini, Srikar, MD  HYDROcodone-acetaminophen (NORCO/VICODIN) 5-325 MG tablet Take 1-2 tablets by mouth every 6 (six) hours as needed for moderate pain or severe pain. 06/01/18   Ihor AustinPyreddy, Pavan, MD  insulin glargine (LANTUS) 100 unit/mL SOPN Inject 0.1 mLs (10 Units total) into the skin daily. 06/13/18   Altamese DillingVachhani, Vaibhavkumar, MD  metFORMIN (GLUCOPHAGE) 500 MG tablet Take 1 tablet (500 mg total) by mouth 2 (two) times daily with a meal. 06/13/18   Altamese DillingVachhani, Vaibhavkumar, MD  pantoprazole (PROTONIX) 40 MG tablet Take 40 mg by mouth 2 (two) times daily.  04/25/18   [provider]  polyethylene glycol (MIRALAX / GLYCOLAX) packet Take 17 g by mouth daily. 06/14/18   Altamese DillingVachhani, Vaibhavkumar, MD  potassium chloride (K-DUR,KLOR-CON) 10 MEQ tablet Take 1 tablet (10 mEq total) by mouth daily. 06/13/18   Altamese DillingVachhani, Vaibhavkumar, MD  protein supplement shake (PREMIER PROTEIN) LIQD Take 325 mLs (11 oz total) by mouth 2 (two) times daily between meals. 06/14/18   Altamese DillingVachhani, Vaibhavkumar, MD  rivaroxaban (XARELTO) 10 MG TABS tablet Take 10 mg by mouth every evening.    [provider]  SANTYL ointment Apply 1 application topically daily.  05/19/18   [provider]   sodium hypochlorite (DAKIN'S 1/4 STRENGTH) 0.125 % SOLN Apply 1 application topically daily. Use as a wound cleanser, pat dry, then apply dressings 12/18/17   [provider]  SPIRIVA HANDIHALER 18 MCG inhalation capsule Place 18 mcg into inhaler and inhale daily.  05/19/18   [provider]  tamsulosin (FLOMAX) 0.4 MG CAPS capsule Take 1 capsule (0.4 mg total) by mouth daily. 06/14/18   Altamese DillingVachhani, Vaibhavkumar, MD  vitamin C (VITAMIN C) 500 MG tablet Take 1 tablet (500 mg total) by mouth 2 (two) times daily. 06/13/18   Altamese DillingVachhani, Vaibhavkumar, MD    Inpatient Medications:  . aspirin  81 mg Oral Daily  . atorvastatin  40 mg Oral q1800  . ferrous sulfate  325 mg Oral BID WC  . furosemide  40 mg Oral Daily  . insulin aspart  0-15 Units Subcutaneous TID WC  . insulin aspart  0-5 Units Subcutaneous QHS  . insulin aspart  3 Units Subcutaneous TID WC  . insulin glargine  10 Units Subcutaneous QHS  . ipratropium-albuterol  3 mL Nebulization Once  . metoprolol tartrate  5 mg Intravenous Q6H  . midodrine  10 mg Oral TID WC  . mometasone-formoterol  2 puff Inhalation BID  . multivitamin-lutein  1 capsule Oral Daily  . pantoprazole  40 mg Oral BID  .  polyethylene glycol  17 g Oral Daily  . ENSURE MAX PROTEIN  11 oz Oral BID  . tamsulosin  0.4 mg Oral Daily  . tiotropium  18 mcg Inhalation Daily  . ascorbic acid  500 mg Oral BID   . sodium chloride    .  ceFAZolin (ANCEF) IV    . heparin 1,150 Units/hr (07/17/18 0653)  . vancomycin Stopped (07/16/18 2219)    Allergies:  Allergies  Allergen Reactions  . Eggs Or Egg-Derived Products Diarrhea, Itching and Nausea And Vomiting    Social History   Socioeconomic History  . Marital status: Divorced    Spouse name: Not on file  . Number of children: Not on file  . Years of education: Not on file  . Highest education level: Not on file  Occupational History  . Not on file  Social Needs  . Financial resource strain: Not on  file  . Food insecurity:    Worry: Not on file    Inability: Not on file  . Transportation needs:    Medical: Not on file    Non-medical: Not on file  Tobacco Use  . Smoking status: Current Every Day Smoker    Packs/day: 1.50    Years: 47.00    Pack years: 70.50    Types: Cigarettes  . Smokeless tobacco: Never Used  . Tobacco comment: Smoking History 2.5-started at age 59  Substance and Sexual Activity  . Alcohol use: No    Alcohol/week: 0.0 standard drinks  . Drug use: No  . Sexual activity: Not on file  Lifestyle  . Physical activity:    Days per week: Not on file    Minutes per session: Not on file  . Stress: Not on file  Relationships  . Social connections:    Talks on phone: Not on file    Gets together: Not on file    Attends religious service: Not on file    Active member of club or organization: Not on file    Attends meetings of clubs or organizations: Not on file    Relationship status: Not on file  . Intimate partner violence:    Fear of current or ex partner: Not on file    Emotionally abused: Not on file    Physically abused: Not on file    Forced sexual activity: Not on file  Other Topics Concern  . Not on file  Social History Narrative  . Not on file     Family History  Problem Relation Age of Onset  . Brain cancer Mother      Review of Systems Positive for foot pain shortness of breath Negative for: General:  chills, fever, night sweats or weight changes.  Cardiovascular: PND orthopnea syncope dizziness  Dermatological skin lesions rashes Respiratory: Cough congestion Urologic: Frequent urination urination at night and hematuria Abdominal: negative for nausea, vomiting, diarrhea, bright red blood per rectum, melena, or hematemesis Neurologic: negative for visual changes, and/or hearing changes  All other systems reviewed and are otherwise negative except as noted above.  Labs: Recent Labs    07/16/18 1439 07/16/18 1737 07/17/18 0215   TROPONINI <0.03 0.11* 1.19*   Lab Results  Component Value Date   WBC 11.1 (H) 07/17/2018   HGB 7.7 (L) 07/17/2018   HCT 26.4 (L) 07/17/2018   MCV 96.7 07/17/2018   PLT 185 07/17/2018    Recent Labs  Lab 07/17/18 0526  NA 134*  K 4.7  CL 99  CO2 25  BUN 30*  CREATININE 1.04*  CALCIUM 8.3*  GLUCOSE 260*   No results found for: CHOL, HDL, LDLCALC, TRIG No results found for: DDIMER  Radiology/Studies:  Dg Chest Port 1 View  Result Date: 07/16/2018 CLINICAL DATA:  Shortness of breath, coughing up yellow sputum, history COPD, diabetes mellitus, smoker EXAM: PORTABLE CHEST 1 VIEW COMPARISON:  Portable exam 1416 hours compared to 06/11/2018 FINDINGS: Normal heart size and mediastinal contours. Question mild pulmonary vascular congestion. Atherosclerotic calcification aorta. Diffuse interstitial prominence and scattered Kerley B-lines at the mid to lower lungs question pulmonary edema. Decreased bibasilar effusions and atelectasis versus previous exam. Question subtle developing infiltrate RIGHT upper lobe. No pneumothorax. Bones demineralized. IMPRESSION: Question subtle RIGHT upper lobe infiltrate. Improved bibasilar pleural effusions and atelectasis. Persistent interstitial prominence in mid to lower lungs suggesting minimal pulmonary edema. Electronically Signed   By: Ulyses SouthwardMark  Boles M.D.   On: 07/16/2018 14:39   Dg Foot Complete Right  Result Date: 07/10/2018 CLINICAL DATA:  Foot ulcer. EXAM: RIGHT FOOT COMPLETE - 3+ VIEW COMPARISON:  06/03/2018 FINDINGS: Stable postsurgical resection of the distal first metatarsal bone. Again seen is irregularity of the base of the first proximal phalanx possibly due to ongoing osteomyelitis. 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 Cortical irregularity of the remaining metatarsal bone is stable from November 2019. There is a deep penetrating wound in the plantar foot at the level of the first metatarsophalangeal joint. IMPRESSION: Probable  surgical resection of the distal first metatarsal bone. Deep penetrating wound at the level of the first metatarsophalangeal joint, with cortical irregularity of the base of the first proximal phalanx, suggestive possible osteomyelitis. Electronically Signed   By: Ted Mcalpineobrinka  Dimitrova M.D.   On: 07/10/2018 14:27    EKG: Normal sinus rhythm with inferior infarct age undetermined with nonspecific lateral T wave inversions  Weights: Filed Weights   07/14/18 1600 07/15/18 1241 07/16/18 0746  Weight: 47.8 kg 47.8 kg 47.8 kg     Physical Exam: Blood pressure 112/63, pulse 96, temperature 98.4 F (36.9 C), temperature source Oral, resp. rate 18, height 5\' 2"  (1.575 m), weight 47.8 kg, SpO2 100 %. Body mass index is 19.27 kg/m. General: Well developed, well nourished, in no acute distress. Head eyes ears nose throat: Normocephalic, atraumatic, sclera non-icteric, no xanthomas, nares are without discharge. No apparent thyromegaly and/or mass  Lungs: Normal respiratory effort.  Some wheezes, no rales, few rhonchi.  Heart: RRR with normal S1 S2. no murmur gallop, no rub, PMI is normal size and placement, carotid upstroke normal without bruit, jugular venous pressure is normal Abdomen: Soft, non-tender, non-distended with normoactive bowel sounds. No hepatomegaly. No rebound/guarding. No obvious abdominal masses. Abdominal aorta is normal size without bruit Extremities: Trace edema. no cyanosis, no clubbing positive ulcers  Peripheral : 2+ bilateral upper extremity pulses, 2+ bilateral femoral pulses, 2+ bilateral dorsal pedal pulse Neuro: Alert and oriented. No facial asymmetry. No focal deficit. Moves all extremities spontaneously. Musculoskeletal: Normal muscle tone without kyphosis Psych:  Responds to questions appropriately with a normal affect.    Assessment: 63 year old female with diabetes hypertension hyperlipidemia with significant diabetic peripheral vascular disease and infections with  recent tachycardia somnolence and elevated troponin consistent with non-ST elevation myocardial infarction due to current condition rather than acute coronary syndrome  Plan: 1.  Continue supportive care diabetic peripheral vascular disease and ulcers with antibiotics and help with perfusion 2.  Continue anticoagulation for further risk reduction and possible deep venous thrombosis pulmonary embolism possibly driving some current  symptoms 3.  Further investigation of possible bleeding complications and other causes of significant anemia which also contributed to current condition 4.  Consider echocardiogram for LV systolic dysfunction valvular heart disease contributing to above 5.  Continue hypertension control with metoprolol as able 6.  High intensity cholesterol therapy 7.  Further investigation of elevated troponin after stabilization of above due to patient currently not having significant anginal symptoms requiring further invasive procedure  Signed, Lamar Blinks M.D. [facility] Cardiology 07/17/2018, 8:13 AM

## 2018-07-17 NOTE — Op Note (Signed)
OPERATIVE NOTE   PROCEDURE: 1. Left common femoral, profunda femoris, and superficial femoral artery endarterectomies 2.   Right common femoral and profunda femoris endarterectomies 3.   Partial resection of right SFA stent 4.   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 significant plaque in bilateral common femoral, profunda femoris arteries and the left proximal SFA.  Stent from the right SFA was protruding about a centimeter into the common femoral artery  SPECIMEN(S):Left common femoral, profunda femoris, and superficial femoral artery plaque. Right common femoral and profunda femoris artery plaque.  Resected portion of right SFA stent  INDICATIONS:  Patient presents with nonhealing ulcerations of both lower extremities.  She has severe common femoral and bifurcation disease on the left.  On the right she has some common femoral disease as well as profunda femoris disease with a previously placed SFA stent that is protruding into the common femoral artery potentially limiting blood flow to the right profunda femoris artery.  Bilateral femoral endarterectomies are planned to try to improve perfusion. The risks and benefits as well as alternative therapies including intervention were reviewed in detail all questions were answered the patient agrees to proceed with surgery. An assistant was present during the procedure to help facilitate the exposure and expedite the procedure.  DESCRIPTION: After obtaining full informed written consent, the patient was brought back to the operating room and placed supine upon the operating table. The patient received IV antibiotics prior to  induction. After obtaining adequate anesthesia, the patient was prepped and draped in the standard fashion appropriate time out is called. The assistant provided retraction and mobilization to help facilitate exposure and expedite the procedure throughout the entire procedure.  This included following suture, using retractors, and optimizing lighting.  We began by dissecting out the femoral arteries on each side. Vertical incisions were created overlying both femoral arteries. The common femoral artery proximally, and superficial femoral artery, and primary profunda femoris artery branches were encircled with vessel loops and prepared for control. Both femoral arteries were found to have significant plaque from the common femoral artery into the profunda and superficial femoral arteries.   4000 units of heparin was given and allowed circulate for 5 minutes.  An additional 2000 units were given later in the procedure. Initially, due to suboptimal imaging of the left lower extremity with an aortic injection, selective imaging is performed to determine whether or not infrainguinal disease will need to be treated.  A micropuncture sheath was placed in the common femoral artery on the left and directed into the proximal left SFA.  Imaging of the left lower extremity was then performed.  She had a long segment left SFA and popliteal stents which were patent with less than 20% stenosis except at the leading edge of the proximal stent in the proximal SFA which would be addressed with endarterectomy.  She then had two-vessel runoff distally.  No infrainguinal intervention was necessary.  The micropuncture sheath was removed. Attention is then turned to the left femoral artery and control was obtained with Vesseloops and profunda clamps on the SFA and profunda femoris artery. An arteriotomy is made with 11 blade and extended with Potts scissors in the common femoral artery and carried down onto the first 2 cm  of the  superficial femoral artery to the top of the previously placed stent. An endarterectomy was then performed. The [facility] elevator was used to create a plane. The proximal endpoint was cut flush with tenotomy scissors. This was in the proximal common femoral artery. An eversion endarterectomy was then performed for the first 2-3 cm of the profunda femoris artery with a nice feathered edge appearing to be created. Good backbleeding was then seen. The distal endpoint of the SFA endarterectomy was created with gentle traction and the distal endpoint was fairly clean.  A total of 4 tacking sutures used to tack down the profunda femoris endarterectomy and the SFA endarterectomy. The Cormatrix patcth is then selected and prepared for a patch angioplasty.  It is cut and beveled and started at the proximal endpoint with a 6-0 Prolene suture.  Approximately one half of the suture line is run medially and laterally and the distal end point was cut and bevelled to match the arteriotomy.  A second 6-0 Prolene was started at the distal end point and run to the mid portion to complete the arteriotomy.  The vessel was flushed prior to release of control and completion of the anastomosis.  At this point, flow was established first to the profunda femoris artery and then to the superficial femoral artery. Easily palpable pulses are noted well beyond the anastomosis and both arteries.  The right femoral artery is then addressed. Arteriotomy is made in the common femoral artery and extended down into the first 2 to 3 cm of the right profunda femoris artery. Similarly, an endarterectomy was performed with the [facility] elevator. The proximal endpoint was cut flush with tenotomy scissors in the proximal common femoral artery.  The right SFA stent could be seen about a centimeter into the common femoral artery or a little less.  Potts scissors were then used to trim the proximal edge of the SFA stent flush with the origin of the SFA to  avoid any impingement on the profunda femoris artery.  This was sent as a specimen.  The arteriotomy had been carried down onto the proximal profunda femoris artery just beyond the primary branch and the endarterectomy was continued to this point. The distal endpoint was cut flush with tenotomy scissors and tacked down with two 7-0 Prolene sutures. The Cormatrix extracellular patch was then brought onto the field.  It is cut and beveled and started at the proximal endpoint with a 6-0 Prolene suture.  Approximately one half of the suture line is run medially and laterally and the distal end point was cut and bevelled to match the arteriotomy.  A second 6-0 Prolene was started at the distal end point in the profunda femoris artery and run to the mid portion to complete the arteriotomy.   Flushing maneuvers were performed and flow was reestablished to the femoral vessels. Excellent pulses noted in the right superficial femoral and profunda femoris artery below the femoral anastomosis.  Surgicel and Evicel topical hemostatic agents were placed in the femoral incisions and hemostasis was complete. The femoral incisions were then closed in a layered fashion with 2 layers of 2-0 Vicryl, 2 layers of 3-0 Vicryl, and staples for the skin closure.  An incisional VAC was placed on both sides and a good seal was obtained.  The patient was then awakened from anesthesia and taken to the recovery room in stable condition having tolerated the procedure well.  COMPLICATIONS: None  CONDITION: Stable     Festus BarrenJason Yvette Roark 07/17/2018 5:46  PM  This note was created with Dragon Medical transcription system. Any errors in dictation are purely unintentional.

## 2018-07-17 NOTE — Care Management (Signed)
Per MD to be determined if patient will require wound vac at discharge post procedure

## 2018-07-17 NOTE — Progress Notes (Signed)
Inpatient Diabetes Program Recommendations  AACE/ADA: New Consensus Statement on Inpatient Glycemic Control   Target Ranges:  Prepandial:   less than 140 mg/dL      Peak postprandial:   less than 180 mg/dL (1-2 hours)      Critically ill patients:  140 - 180 mg/dL  Results for SIBLEY, GOTTSCHALL (MRN [id number]) as of 07/17/2018 09:32  Ref. Range 07/16/2018 08:25 07/16/2018 11:41 07/16/2018 16:04 07/16/2018 16:41 07/16/2018 22:08 07/17/2018 07:44  Glucose-Capillary Latest Ref Range: 70 - 99 mg/dL 263 (H) 335 (H) 95 456 (H) 299 (H) 238 (H)    Review of Glycemic Control Diabetes history:DM2 Outpatient Diabetes medications:Lantus 10 units daily, Metformin 500 mg BID Current orders for Inpatient glycemic control:Lantus 10 units QHS, Novolog 0-15 units TID with meals, Novolog 0-5 units QHS, Novolog 3 units TID with meals for meal coverage  Inpatient Diabetes Program Recommendations:  Insulin - Basal:  Please consider increasing Lantus to 12 units QHS  Thanks, Orlando Penner, RN, MSN, CDE Diabetes Coordinator Inpatient Diabetes Program [phone] (Team Pager from 8am to 5pm)

## 2018-07-17 NOTE — Progress Notes (Signed)
[facility]  Patient: April KillingsDoris J [name] / Admit Date: 07/09/2018 / Date of Encounter: 07/17/2018, 12:58 PM   Subjective: The patient currently has had no evidence of congestive heart failure symptoms anginal symptoms or other distress.  Further evaluation of EKG shows normal sinus rhythm with possible inferior infarct age undetermined and unchanged from previous EKG from the last 2 years.  The patient does have an elevated troponin probably consistent with non-ST elevation myocardial infarction likely due to recent history of either tachycardia or shortness of breath yesterday.  The patient has a very unclear recollection and notes are lacking to describe anything significant.  Telemetry does not show any significant rhythm service is recorded.  Last blood pressure was significantly elevated and will likely need to be reassessed for possible medication management prior to any further surgical intervention.  Based on current condition and severe concerns of continued limb ischemia the patient is at lowest risk possible for cardiovascular complication with surgical intervention  Review of Systems: Positive for: Foot pain Negative for: Vision change, hearing change, syncope, dizziness, nausea, vomiting,diarrhea, bloody stool, stomach pain, cough, congestion, diaphoresis, urinary frequency, urinary pain,skin lesions, skin rashes Others previously listed  Objective:   Physical Exam: Blood pressure 112/63, pulse 96, temperature 98.4 F (36.9 C), temperature source Oral, resp. rate 18, height 5\' 2"  (1.575 m), weight 47.8 kg, SpO2 100 %. Body mass index is 19.27 kg/m. General: Well developed, well nourished, in no acute distress. Head: Normocephalic, atraumatic, sclera non-icteric, no xanthomas, nares are without discharge. Neck: No apparent masses Lungs: Normal respirations with few wheezes, no rhonchi, no rales , no crackles   Heart: Regular rate and rhythm, normal  S1 S2, no murmur, no rub, no gallop, PMI is normal size and placement, carotid upstroke normal without bruit, jugular venous pressure normal Abdomen: Soft, non-tender, non-distended with normoactive bowel sounds. No hepatosplenomegaly. Abdominal aorta is normal size without bruit Extremities: Trace edema, no clubbing, positive cyanosis, positive ulcers,  Peripheral: 2+ radial, 2+ femoral, 2+ dorsal pedal pulses Neuro: Alert and oriented. Moves all extremities spontaneously. Psych:  Responds to questions appropriately with a normal affect.   Intake/Output Summary (Last 24 hours) at 07/17/2018 1258 Last data filed at 07/17/2018 [id number] Gross per 24 hour  Intake 200 ml  Output 200 ml  Net 0 ml    Inpatient Medications:  . aspirin  81 mg Oral Daily  . atorvastatin  40 mg Oral q1800  . ferrous sulfate  325 mg Oral BID WC  . furosemide  40 mg Oral Daily  . insulin aspart  0-15 Units Subcutaneous TID WC  . insulin aspart  0-5 Units Subcutaneous QHS  . insulin aspart  3 Units Subcutaneous TID WC  . insulin glargine  10 Units Subcutaneous QHS  . ipratropium-albuterol  3 mL Nebulization Once  . metoprolol tartrate  5 mg Intravenous Q6H  . midodrine  10 mg Oral TID WC  . mometasone-formoterol  2 puff Inhalation BID  . multivitamin-lutein  1 capsule Oral Daily  . pantoprazole  40 mg Oral BID  . polyethylene glycol  17 g Oral Daily  . ENSURE MAX PROTEIN  11 oz Oral BID  . tamsulosin  0.4 mg Oral Daily  . tiotropium  18 mcg Inhalation Daily  . ascorbic acid  500 mg Oral BID   Infusions:  . sodium chloride    .  ceFAZolin (ANCEF) IV    . heparin 1,150 Units/hr (07/17/18 0653)  . vancomycin Stopped (07/16/18  2219)    Labs: Recent Labs    07/16/18 1737 07/17/18 0526  NA 135 134*  K 4.9 4.7  CL 101 99  CO2 26 25  GLUCOSE 152* 260*  BUN 23 30*  CREATININE 0.76 1.04*  CALCIUM 8.1* 8.3*  MG 1.9 2.1   No results for input(s): AST, ALT, ALKPHOS, BILITOT, PROT, ALBUMIN in the last 72  hours. Recent Labs    07/16/18 1737 07/17/18 0526  WBC 13.0* 11.1*  NEUTROABS 10.6*  --   HGB 7.4* 7.7*  HCT 24.7* 26.4*  MCV 95.4 96.7  PLT 161 185   Recent Labs    07/16/18 1439 07/16/18 1737 07/17/18 0215  TROPONINI <0.03 0.11* 1.19*   Invalid input(s): POCBNP No results for input(s): HGBA1C in the last 72 hours.   Weights: Filed Weights   07/14/18 1600 07/15/18 1241 07/16/18 0746  Weight: 47.8 kg 47.8 kg 47.8 kg     Radiology/Studies:  Dg Chest Port 1 View  Result Date: 07/16/2018 CLINICAL DATA:  Shortness of breath, coughing up yellow sputum, history COPD, diabetes mellitus, smoker EXAM: PORTABLE CHEST 1 VIEW COMPARISON:  Portable exam 1416 hours compared to 06/11/2018 FINDINGS: Normal heart size and mediastinal contours. Question mild pulmonary vascular congestion. Atherosclerotic calcification aorta. Diffuse interstitial prominence and scattered Kerley B-lines at the mid to lower lungs question pulmonary edema. Decreased bibasilar effusions and atelectasis versus previous exam. Question subtle developing infiltrate RIGHT upper lobe. No pneumothorax. Bones demineralized. IMPRESSION: Question subtle RIGHT upper lobe infiltrate. Improved bibasilar pleural effusions and atelectasis. Persistent interstitial prominence in mid to lower lungs suggesting minimal pulmonary edema. Electronically Signed   By: Ulyses Southward M.D.   On: 07/16/2018 14:39   Dg Foot Complete Right  Result Date: 07/10/2018 CLINICAL DATA:  Foot ulcer. EXAM: RIGHT FOOT COMPLETE - 3+ VIEW COMPARISON:  06/03/2018 FINDINGS: Stable postsurgical resection of the distal first metatarsal bone. Again seen is irregularity of the base of the first proximal phalanx possibly due to ongoing osteomyelitis. 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 Cortical irregularity of the remaining metatarsal bone is stable from November 2019. There is a deep penetrating wound in the plantar foot at the level of the first  metatarsophalangeal joint. IMPRESSION: Probable surgical resection of the distal first metatarsal bone. Deep penetrating wound at the level of the first metatarsophalangeal joint, with cortical irregularity of the base of the first proximal phalanx, suggestive possible osteomyelitis. Electronically Signed   By: Ted Mcalpine M.D.   On: 07/10/2018 14:27     Assessment and Recommendation  63 y.o. female with diabetes essential hypertension mixed hyperlipidemia previously on appropriate medication management with no evidence of angina or congestive heart failure symptoms with a stable EKG suggesting previous myocardial infarction at moderate risk for cardiovascular complication with peripheral vascular surgical intervention 1.  Evaluation of hypertension and potentially could add beta-blocker for better heart rate control and blood pressure control prior to surgical intervention 2.  Continue antiplatelet medication management and/or heparin if able for further risk reduction of complication from the cardiovascular standpoint 3.  Close blood pressure and heart rate monitoring during surgical intervention 4.  No further cardiac diagnostics necessary at this time due to no evidence of anginal symptoms new EKG changes or congestive heart failure  Signed, Arnoldo Hooker M.D. FACC

## 2018-07-18 ENCOUNTER — Inpatient Hospital Stay: Payer: Medicare HMO

## 2018-07-18 ENCOUNTER — Encounter: Payer: Self-pay | Admitting: Vascular Surgery

## 2018-07-18 DIAGNOSIS — Z9889 Other specified postprocedural states: Secondary | ICD-10-CM

## 2018-07-18 DIAGNOSIS — J9601 Acute respiratory failure with hypoxia: Secondary | ICD-10-CM

## 2018-07-18 LAB — BASIC METABOLIC PANEL
Anion gap: 6 (ref 5–15)
BUN: 28 mg/dL — AB (ref 8–23)
CO2: 24 mmol/L (ref 22–32)
Calcium: 7.9 mg/dL — ABNORMAL LOW (ref 8.9–10.3)
Chloride: 103 mmol/L (ref 98–111)
Creatinine, Ser: 0.87 mg/dL (ref 0.44–1.00)
GFR calc Af Amer: >60 mL/min (ref >60)
GFR calc non Af Amer: >60 mL/min (ref >60)
Glucose, Bld: 234 mg/dL — ABNORMAL HIGH (ref 70–99)
Potassium: 4.7 mmol/L (ref 3.5–5.1)
Sodium: 133 mmol/L — ABNORMAL LOW (ref 135–145)

## 2018-07-18 LAB — HEPARIN LEVEL (UNFRACTIONATED)
Heparin Unfractionated: 0.41 IU/mL (ref 0.30–0.70)
Heparin Unfractionated: 0.23 IU/mL — ABNORMAL LOW (ref 0.30–0.70)

## 2018-07-18 LAB — URINALYSIS, COMPLETE (UACMP) WITH MICROSCOPIC
Bilirubin Urine: NEGATIVE
Glucose, UA: NEGATIVE mg/dL
Hgb urine dipstick: NEGATIVE
Ketones, ur: NEGATIVE mg/dL
Nitrite: NEGATIVE
PH: 5 (ref 5.0–8.0)
Protein, ur: 100 mg/dL — AB
RBC / HPF: >50 RBC/hpf — ABNORMAL HIGH (ref 0–5)
Specific Gravity, Urine: 1.03 (ref 1.005–1.030)

## 2018-07-18 LAB — CBC
HCT: 21.8 % — ABNORMAL LOW (ref 36.0–46.0)
Hemoglobin: 6.5 g/dL — ABNORMAL LOW (ref 12.0–15.0)
MCH: 28.6 pg (ref 26.0–34.0)
MCHC: 29.8 g/dL — ABNORMAL LOW (ref 30.0–36.0)
MCV: 96 fL (ref 80.0–100.0)
Platelets: 172 10*3/uL (ref 150–400)
RBC: 2.27 MIL/uL — ABNORMAL LOW (ref 3.87–5.11)
RDW: 15.4 % (ref 11.5–15.5)
WBC: 11.3 10*3/uL — ABNORMAL HIGH (ref 4.0–10.5)
nRBC: 0.2 % (ref 0.0–0.2)

## 2018-07-18 LAB — PROCALCITONIN: Procalcitonin: 0.77 ng/mL

## 2018-07-18 LAB — PREPARE RBC (CROSSMATCH)

## 2018-07-18 LAB — TROPONIN I: Troponin I: 0.4 ng/mL (ref <0.03)

## 2018-07-18 LAB — GLUCOSE, CAPILLARY
GLUCOSE-CAPILLARY: 84 mg/dL (ref 70–99)
Glucose-Capillary: 198 mg/dL — ABNORMAL HIGH (ref 70–99)
Glucose-Capillary: 149 mg/dL — ABNORMAL HIGH (ref 70–99)
Glucose-Capillary: 60 mg/dL — ABNORMAL LOW (ref 70–99)
Glucose-Capillary: 109 mg/dL — ABNORMAL HIGH (ref 70–99)

## 2018-07-18 LAB — HEMOGLOBIN: Hemoglobin: 9.5 g/dL — ABNORMAL LOW (ref 12.0–15.0)

## 2018-07-18 MED ORDER — LORAZEPAM 2 MG/ML IJ SOLN
0.5000 mg | Freq: Once | INTRAMUSCULAR | Status: AC
  Administered 2018-07-18: 0.5 mg via INTRAVENOUS

## 2018-07-18 MED ORDER — SODIUM CHLORIDE 0.9 % IV BOLUS
250.0000 mL | Freq: Once | INTRAVENOUS | Status: AC
  Administered 2018-07-18: 250 mL via INTRAVENOUS

## 2018-07-18 MED ORDER — LORAZEPAM 2 MG/ML IJ SOLN
INTRAMUSCULAR | Status: AC
  Administered 2018-07-18: 0.5 mg via INTRAVENOUS
  Filled 2018-07-18: qty 1

## 2018-07-18 MED ORDER — NOREPINEPHRINE-SODIUM CHLORIDE 4-0.9 MG/250ML-% IV SOLN
0.0000 ug/min | INTRAVENOUS | Status: DC
  Administered 2018-07-18: 8 ug/min via INTRAVENOUS
  Filled 2018-07-18: qty 250

## 2018-07-18 MED ORDER — VANCOMYCIN HCL IN DEXTROSE 750-5 MG/150ML-% IV SOLN
750.0000 mg | INTRAVENOUS | Status: DC
  Administered 2018-07-18 – 2018-07-25 (×8): 750 mg via INTRAVENOUS
  Filled 2018-07-18 (×11): qty 150

## 2018-07-18 MED ORDER — PHENYLEPHRINE HCL-NACL 10-0.9 MG/250ML-% IV SOLN
0.0000 ug/min | INTRAVENOUS | Status: DC
  Filled 2018-07-18: qty 250

## 2018-07-18 MED ORDER — PHENYLEPHRINE HCL-NACL 10-0.9 MG/250ML-% IV SOLN
0.0000 ug/min | INTRAVENOUS | Status: DC
  Administered 2018-07-18: 20 ug/min via INTRAVENOUS
  Filled 2018-07-18: qty 250

## 2018-07-18 MED ORDER — SODIUM CHLORIDE 0.9 % IV SOLN
INTRAVENOUS | Status: DC
  Administered 2018-07-18: 02:00:00 via INTRAVENOUS

## 2018-07-18 MED ORDER — SODIUM CHLORIDE 0.9% IV SOLUTION: Freq: Once | INTRAVENOUS | Status: DC

## 2018-07-18 MED ORDER — SODIUM CHLORIDE 0.9 % IV SOLN
1.0000 g | Freq: Two times a day (BID) | INTRAVENOUS | Status: DC
  Administered 2018-07-18 – 2018-07-21 (×7): 1 g via INTRAVENOUS
  Filled 2018-07-18 (×8): qty 1

## 2018-07-18 NOTE — Progress Notes (Signed)
[facility] Daily Progress Note    Subjective: 1 Day Post-Op: Left common femoral, profunda femoris, and superficial femoral artery endarterectomies, Right common femoral and profunda femoris endarterectomies, Partial resection of right SFA stent with Angiogram of the left lower extremity through a right femoral micropuncture sheath  3 Day Post-Op: Introduction catheter into right lower extremity 3rd order catheter placement antegrade approach, Introduction catheter into aorta left common femoral retrograde approach,  Percutaneous transluminal angioplasty and stent placement right superficial femoral artery, Minx closure right common femoral arteriotomy  Patient seen and examined in the ICU this morning.  Very lethargic but arousable.  Patient states "my legs hurt".  Objective: Vitals:   07/18/18 0715 07/18/18 0800 07/18/18 0900 07/18/18 0925  BP: 119/70 (!) 106/59 110/65 107/69  Pulse: (!) 114 (!) 110 (!) 104 (!) 106  Resp: (!) 22 20 (!) 23 20  Temp:  99.5 F (37.5 C) 98.7 F (37.1 C) 98.7 F (37.1 C)  TempSrc:  Axillary Axillary Axillary  SpO2: 94% 94% 95% 96%  Weight:      Height:        Intake/Output Summary (Last 24 hours) at 07/18/2018 1159 Last data filed at 07/18/2018 0900 Gross per 24 hour  Intake 3370.75 ml  Output 875 ml  Net 2495.75 ml   Physical Exam: Lethargic but arousable CV: RRR Pulmonary: CTA Bilaterally Abdomen: Soft, Nontender, Nondistended Bilateral Groins: OR dressings intact clean and dry.  No swelling or drainage noted. Vascular:  Right lower extremity: Thigh soft.  Calf soft.  Dopplerable PT pulse.  Left lower extremity: Thigh soft.  Calf soft.  Doppler DP pulse.   Laboratory: CBC    Component Value Date/Time   WBC 11.3 (H) 07/18/2018 0210   HGB 6.5 (L) 07/18/2018 0210   HGB 13.6 10/14/2014 0644   HCT 21.8 (L) 07/18/2018 0210   HCT 41.3 10/14/2014 0644   PLT 172 07/18/2018 0210   PLT 156 10/14/2014 0644   BMET     Component Value Date/Time   NA 133 (L) 07/18/2018 0210   NA 142 10/14/2014 0644   K 4.7 07/18/2018 0210   K 3.6 10/14/2014 0644   CL 103 07/18/2018 0210   CL 109 10/14/2014 0644   CO2 24 07/18/2018 0210   CO2 29 10/14/2014 0644   GLUCOSE 234 (H) 07/18/2018 0210   GLUCOSE 142 (H) 10/14/2014 0644   BUN 28 (H) 07/18/2018 0210   BUN 9 10/14/2014 0644   CREATININE 0.87 07/18/2018 0210   CREATININE 0.53 10/14/2014 0644   CALCIUM 7.9 (L) 07/18/2018 0210   CALCIUM 7.7 (L) 10/14/2014 0644   GFRNONAA >60 07/18/2018 0210   GFRNONAA >60 10/14/2014 0644   GFRAA >60 07/18/2018 0210   GFRAA >60 10/14/2014 0644   Assessment/Planning: The patient is a 63 year old female with multiple medical issues with severe peripheral artery disease to the bilateral extremities status post bilateral femoral endarterectomies postop day 1 1) Groins incisions are clean dry and intact. 2) At least one dopplerable pulse to the bilateral feet 3) Currently on heparin gtt 4) Will continue to follow 5) Care as per primary team/cardiology  Discussed with Dr. Wallis [facility] PA-C 07/18/2018 11:59 AM

## 2018-07-18 NOTE — Progress Notes (Signed)
1 Day Post-Op   Subjective/Chief Complaint: Patient seen.  Still not communicating very well.   Objective: Vital signs in last 24 hours: Temp:  [97.7 F (36.5 C)-99.5 F (37.5 C)] 98.3 F (36.8 C) (01/17 1250) Pulse Rate:  [96-130] 112 (01/17 1700) Resp:  [10-31] 28 (01/17 1700) BP: (81-128)/(27-85) 127/75 (01/17 1700) SpO2:  [89 %-99 %] 89 % (01/17 1700) Arterial Line BP: (68-128)/(41-64) 119/53 (01/17 1300) Last BM Date: 07/14/18  Intake/Output from previous day: 01/16 0701 - 01/17 0700 In: 2705.8 [P.O.:120; I.V.:2286; IV Piggyback:299.7] Out: 1075 [Urine:815; Emesis/NG output:60; Drains:100; Blood:100] Intake/Output this shift: Total I/O In: 1328.2 [I.V.:663.2; Blood:665] Out: 550 [Urine:550]  A small amount of purulent drainage noted on the pad on the left heel.  Upon removal there is still an eschar over the posterior heel with minimal surrounding erythema.  Mildly boggy to the touch.  MRI was reviewed which did reveal findings consistent with osteomyelitis in the posterior aspect of the left calcaneus.  Lab Results:  Recent Labs    07/17/18 0526 07/18/18 0210 07/18/18 1450  WBC 11.1* 11.3*  --   HGB 7.7* 6.5* 9.5*  HCT 26.4* 21.8*  --   PLT 185 172  --    BMET Recent Labs    07/17/18 0526 07/18/18 0210  NA 134* 133*  K 4.7 4.7  CL 99 103  CO2 25 24  GLUCOSE 260* 234*  BUN 30* 28*  CREATININE 1.04* 0.87  CALCIUM 8.3* 7.9*   PT/INR Recent Labs    07/17/18 0526  LABPROT 14.8  INR 1.17   ABG No results for input(s): PHART, HCO3 in the last 72 hours.  Invalid input(s): PCO2, PO2  Studies/Results: Dg Chest 1 View  Result Date: 07/18/2018 CLINICAL DATA:  Status post central line placement EXAM: CHEST  1 VIEW COMPARISON:  07/18/2018 FINDINGS: Cardiac shadow is stable. New right jugular central line is noted with the catheter tip in the distal superior vena cava. No pneumothorax is seen. Patchy infiltrative changes again noted in the right upper  lobe. Mild hyperinflation is again noted. IMPRESSION: No pneumothorax following central line placement. Patchy infiltrate in the right upper lobe. Electronically Signed   By: Alcide Clever M.D.   On: 07/18/2018 07:21   Mr Ankle Left Wo Contrast  Result Date: 07/18/2018 CLINICAL DATA:  Calcaneal osteomyelitis.  Follow-up. EXAM: MRI OF THE LEFT ANKLE WITHOUT CONTRAST TECHNIQUE: Multiplanar, multisequence MR imaging of the ankle was performed. No intravenous contrast was administered. COMPARISON:  Foot radiographs 07/20/2018. Left forefoot MRI 05/24/2018. FINDINGS: Previous examination included most of the foot, but not the calcaneal tuberosity. TENDONS Peroneal: Intact and normally positioned. Posteromedial: Intact and normally positioned. Trace fluid in the posterior tibialis tendon sheath. Anterior: Intact and normally positioned. Achilles: Intact.  Trace fluid in the retrocalcaneal bursa. Plantar Fascia: Intact. LIGAMENTS Lateral: The anterior and posterior talofibular and calcaneofibular ligaments are intact. Medial: The deltoid and visualized portions of the spring ligament appear intact. CARTILAGE AND BONES Ankle Joint: No significant ankle joint effusion. The talar dome and tibial plafond are intact. Subtalar Joints/Sinus Tarsi: Unremarkable. Bones: There is a large area of soft tissue ulceration posterior to the calcaneal tuberosity. There is underlying prominent marrow T2 hyperintensity in the calcaneal tuberosity. In addition, there is associated decreased T1 signal, but no definite cortical destruction. The additional bones appear unremarkable. Other: As above, large area of soft tissue ulceration posterior to the calcaneal tuberosity without underlying fluid collection. There is diffuse subcutaneous edema surrounding the ankle and  extending into the dorsum of the midfoot and forefoot. No focal fluid collections are seen. Generalized T2 hyperintensity throughout the foot musculature is again noted,  likely chronic myopathy. IMPRESSION: 1. Large area of soft tissue ulceration over the posterior aspect of the heel with underlying marrow T2 hyperintensity and T1 hypointensity in the calcaneal tuberosity consistent with osteomyelitis. This area was not previously imaged. 2. Generalized cellulitis and muscular T2 hyperintensity. No focal fluid collection. 3. No significant ankle tendon or ligament pathology. Electronically Signed   By: Carey BullocksWilliam  Veazey M.D.   On: 07/18/2018 14:51   Dg Chest Port 1 View  Result Date: 07/18/2018 CLINICAL DATA:  Leukocytosis EXAM: PORTABLE CHEST 1 VIEW COMPARISON:  07/16/2018 FINDINGS: Mild central right upper lobe opacity, suspicious for pneumonia. Lungs are otherwise clear. No pleural effusion or pneumothorax. The heart is normal in size. IMPRESSION: Mild right upper lobe opacity, suspicious for pneumonia. Electronically Signed   By: Charline BillsSriyesh  Krishnan M.D.   On: 07/18/2018 03:15   Dg C-arm 1-60 Min-no Report  Result Date: 07/17/2018 Fluoroscopy was utilized by the requesting physician.  No radiographic interpretation.    Anti-infectives: Anti-infectives (From admission, onward)   Start     Dose/Rate Route Frequency Ordered Stop   07/18/18 2200  vancomycin (VANCOCIN) IVPB 750 mg/150 ml premix     750 mg 150 mL/hr over 60 Minutes Intravenous Every 24 hours 07/18/18 1141     07/18/18 0400  ceFEPIme (MAXIPIME) 1 g in sodium chloride 0.9 % 100 mL IVPB     1 g 200 mL/hr over 30 Minutes Intravenous Every 12 hours 07/18/18 0358     07/17/18 0500  ceFAZolin (ANCEF) IVPB 2g/100 mL premix    Note to Pharmacy:  Send with pt to OR   2 g 200 mL/hr over 30 Minutes Intravenous On call 07/16/18 1214 07/17/18 1502   07/15/18 1800  vancomycin (VANCOCIN) IVPB 1000 mg/200 mL premix  Status:  Discontinued     1,000 mg 200 mL/hr over 60 Minutes Intravenous Every 24 hours 07/15/18 1232 07/18/18 1141   07/15/18 0500  ceFAZolin (ANCEF) IVPB 2g/100 mL premix    Note to Pharmacy:   Send with patient to specials   2 g 200 mL/hr over 30 Minutes Intravenous On call 07/14/18 1704 07/15/18 1425   07/14/18 2230  ceFAZolin (ANCEF) IVPB 2g/100 mL premix     2 g 200 mL/hr over 30 Minutes Intravenous  Once 07/14/18 2222 07/16/18 0206   07/14/18 1800  vancomycin (VANCOCIN) 1,250 mg in sodium chloride 0.9 % 250 mL IVPB  Status:  Discontinued     1,250 mg 166.7 mL/hr over 90 Minutes Intravenous Every 24 hours 07/14/18 1517 07/15/18 1232   07/11/18 1800  vancomycin (VANCOCIN) IVPB 1000 mg/200 mL premix  Status:  Discontinued     1,000 mg 200 mL/hr over 60 Minutes Intravenous Every 24 hours 07/11/18 1631 07/14/18 1517   07/11/18 1645  piperacillin-tazobactam (ZOSYN) IVPB 3.375 g  Status:  Discontinued     3.375 g 12.5 mL/hr over 240 Minutes Intravenous Every 8 hours 07/11/18 1631 07/13/18 1847   07/10/18 1600  vancomycin (VANCOCIN) IVPB 1000 mg/200 mL premix  Status:  Discontinued     1,000 mg 200 mL/hr over 60 Minutes Intravenous Every 24 hours 07/10/18 1507 07/11/18 1518   07/09/18 2000  piperacillin-tazobactam (ZOSYN) IVPB 3.375 g  Status:  Discontinued     3.375 g 12.5 mL/hr over 240 Minutes Intravenous Every 8 hours 07/09/18 1911 07/11/18 1518   07/09/18  2000  vancomycin (VANCOCIN) IVPB 750 mg/150 ml premix  Status:  Discontinued     750 mg 150 mL/hr over 60 Minutes Intravenous Every 36 hours 07/09/18 1911 07/10/18 1507      Assessment/Plan: s/p Procedure(s): ENDARTERECTOMY FEMORAL - BILATERAL SFA STENT PLACEMENT - LEFT (Bilateral) Assessment: 1.  Osteomyelitis with full-thickness ulceration left heel.2.  Severe peripheral vascular disease.   Plan: We will redress the ulcer on the left foot.  I did discuss the patient with Dr. Wyn Quaker.  At this point due to the patient's condition I think we will hold off over the weekend as far as any further surgery.  Discussed with the patient and the family member present that her options would be to debride the posterior aspect of the  calcaneus and ulceration and try to allow this to heal versus proceeding with a primary below-knee amputation.  At this point hopefully she will stabilize over the weekend and decision will be made for more definitive treatment early next week.  LOS: 9 days    April [name] 07/18/2018

## 2018-07-18 NOTE — Progress Notes (Signed)
[facility] [place] at [facility]   PATIENT NAME: April DonnaDoris Scavone    MR#:  [id number]  DATE OF BIRTH:  01-22-1956  SUBJECTIVE:  CHIEF COMPLAINT: Patient requiring Levophed drip.  Currently at the time of my evaluation in the morning she was sleeping REVIEW OF SYSTEMS:  CONSTITUTIONAL: No fever, fatigue or weakness.  EYES: No blurred or double vision.  EARS, NOSE, AND THROAT: No tinnitus or ear pain.  RESPIRATORY: No cough, shortness of breath, wheezing or hemoptysis.  CARDIOVASCULAR: No chest pain, orthopnea, edema.  GASTROINTESTINAL: No nausea, vomiting, diarrhea or abdominal pain.  GENITOURINARY: No dysuria, hematuria.  ENDOCRINE: No polyuria, nocturia,  HEMATOLOGY: No anemia, easy bruising or bleeding SKIN: Right foot in dressing and has left heel ulcer MUSCULOSKELETAL: No joint pain or arthritis.   NEUROLOGIC: No tingling, numbness, weakness.  PSYCHIATRY: No anxiety or depression.   DRUG ALLERGIES:   Allergies  Allergen Reactions  . Eggs Or Egg-Derived Products Diarrhea, Itching and Nausea And Vomiting    VITALS:  Blood pressure 121/83, pulse (!) 107, temperature 98.3 F (36.8 C), temperature source Axillary, resp. rate (!) 25, height 5\' 2"  (1.575 m), weight 47.8 kg, SpO2 97 %.  PHYSICAL EXAMINATION:  GENERAL:  63 y.o.-year-old patient lying in the bed with no acute distress.  EYES: Pupils equal, round, reactive to light and accommodation. No scleral icterus. Extraocular muscles intact.  HEENT: Head atraumatic, normocephalic. Oropharynx and nasopharynx clear.  NECK:  Supple, no jugular venous distention. No thyroid enlargement, no tenderness.  LUNGS: Normal breath sounds bilaterally, no wheezing, rales,rhonchi or crepitation. No use of accessory muscles of respiration.  CARDIOVASCULAR: S1, S2 normal. No murmurs, rubs, or gallops.  ABDOMEN: Soft, nontender, nondistended. Bowel sounds present.  EXTREMITIES: No pedal edema, cyanosis, or clubbing.   NEUROLOGIC: Awake, alert and oriented x3 sensation intact. Gait not checked.  PSYCHIATRIC: The patient is alert and oriented x 3.  SKIN: Right foot with wound VAC left heel ulcer with necrosis  LABORATORY PANEL:   CBC Recent Labs  Lab 07/18/18 0210  WBC 11.3*  HGB 6.5*  HCT 21.8*  PLT 172   ------------------------------------------------------------------------------------------------------------------  Chemistries  Recent Labs  Lab 07/17/18 0526 07/18/18 0210  NA 134* 133*  K 4.7 4.7  CL 99 103  CO2 25 24  GLUCOSE 260* 234*  BUN 30* 28*  CREATININE 1.04* 0.87  CALCIUM 8.3* 7.9*  MG 2.1  --    ------------------------------------------------------------------------------------------------------------------  Cardiac Enzymes Recent Labs  Lab 07/18/18 0210  TROPONINI 0.40*   ------------------------------------------------------------------------------------------------------------------  RADIOLOGY:  Dg Chest 1 View  Result Date: 07/18/2018 CLINICAL DATA:  Status post central line placement EXAM: CHEST  1 VIEW COMPARISON:  07/18/2018 FINDINGS: Cardiac shadow is stable. New right jugular central line is noted with the catheter tip in the distal superior vena cava. No pneumothorax is seen. Patchy infiltrative changes again noted in the right upper lobe. Mild hyperinflation is again noted. IMPRESSION: No pneumothorax following central line placement. Patchy infiltrate in the right upper lobe. Electronically Signed   By: Alcide CleverMark  Lukens M.D.   On: 07/18/2018 07:21   Mr Ankle Left Wo Contrast  Result Date: 07/18/2018 CLINICAL DATA:  Calcaneal osteomyelitis.  Follow-up. EXAM: MRI OF THE LEFT ANKLE WITHOUT CONTRAST TECHNIQUE: Multiplanar, multisequence MR imaging of the ankle was performed. No intravenous contrast was administered. COMPARISON:  Foot radiographs 07/20/2018. Left forefoot MRI 05/24/2018. FINDINGS: Previous examination included most of the foot, but not the  calcaneal tuberosity. TENDONS Peroneal: Intact and normally positioned.  Posteromedial: Intact and normally positioned. Trace fluid in the posterior tibialis tendon sheath. Anterior: Intact and normally positioned. Achilles: Intact.  Trace fluid in the retrocalcaneal bursa. Plantar Fascia: Intact. LIGAMENTS Lateral: The anterior and posterior talofibular and calcaneofibular ligaments are intact. Medial: The deltoid and visualized portions of the spring ligament appear intact. CARTILAGE AND BONES Ankle Joint: No significant ankle joint effusion. The talar dome and tibial plafond are intact. Subtalar Joints/Sinus Tarsi: Unremarkable. Bones: There is a large area of soft tissue ulceration posterior to the calcaneal tuberosity. There is underlying prominent marrow T2 hyperintensity in the calcaneal tuberosity. In addition, there is associated decreased T1 signal, but no definite cortical destruction. The additional bones appear unremarkable. Other: As above, large area of soft tissue ulceration posterior to the calcaneal tuberosity without underlying fluid collection. There is diffuse subcutaneous edema surrounding the ankle and extending into the dorsum of the midfoot and forefoot. No focal fluid collections are seen. 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 IMPRESSION: 1. Large area of soft tissue ulceration over the posterior aspect of the heel with underlying marrow T2 hyperintensity and T1 hypointensity in the calcaneal tuberosity consistent with osteomyelitis. This area was not previously imaged. 2. Generalized cellulitis and muscular T2 hyperintensity. No focal fluid collection. 3. No significant ankle tendon or ligament pathology. Electronically Signed   By: Carey Bullocks M.D.   On: 07/18/2018 14:51   Dg Chest Port 1 View  Result Date: 07/18/2018 CLINICAL DATA:  Leukocytosis EXAM: PORTABLE CHEST 1 VIEW COMPARISON:  07/16/2018 FINDINGS: Mild  central right upper lobe opacity, suspicious for pneumonia. Lungs are otherwise clear. No pleural effusion or pneumothorax. The heart is normal in size. IMPRESSION: Mild right upper lobe opacity, suspicious for pneumonia. Electronically Signed   By: Charline Bills M.D.   On: 07/18/2018 03:15   Dg C-arm 1-60 Min-no Report  Result Date: 07/17/2018 Fluoroscopy was utilized by the requesting physician.  No radiographic interpretation.    EKG:   Orders placed or performed during the hospital encounter of 07/09/18  . EKG 12-Lead  . EKG 12-Lead  . EKG 12-Lead  . EKG 12-Lead  . EKG 12-Lead  . 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 -Continue heparin  #5 COPD-stable, no signs of acute exacerbation -Continue home inhalers  #6 uncontrolled type 2 diabetes- recent A1c 10.9. -Continue Lantus will need adjustment  #7 hyperlipidemia-stable -Continue home statin  #8 anxiety-stable -Continue home Xanax  #9 severe protein-calorie malnutrition -Continue home Premier protein twice daily between meals  #10 head lice-that is post treatment permethrin  #11 tobacco abuse -Nicotine patch as needed  All the records are reviewed and case discussed with Care Management/Social Workerr. Management plans discussed with the patient, daughter at bedside and they are in agreement  CODE STATUS: fc  TOTAL TIME TAKING CARE OF THIS PATIENT: 33 minutes.   POSSIBLE D/C IN 2 DAYS,  DEPENDING ON CLINICAL CONDITION.  Note: This dictation was prepared with Dragon dictation along with  smaller phrase technology. Any transcriptional errors that result from this process are unintentional.   Auburn BilberryShreyang Aviah Sorci M.D on 07/18/2018 at 3:02 PM  Between 7am to 6pm - Pager - [phone] After 6pm go to [email or web address] - password [email or web address] [facility]  Office  [phone]  CC: Primary care physician; Mickel FuchsWroth, Thomas H, MD

## 2018-07-18 NOTE — Progress Notes (Signed)
Per Dr Judee Clara do not start heparin drip. We will discuss in rounds.

## 2018-07-18 NOTE — Progress Notes (Signed)
Date of Admission:  07/09/2018   T   ID: April [name] is a 63 y.o. female   Active Problems:   Diabetic foot infection (HCC)    Subjective: In ICU because of soft BP and tachycardia following surgery Underwent  on 1/16 Left common femoral, profunda femoris, and superficial femoral artery endarterectomies 2.   Right common femoral and profunda femoris endarterectomies 3.   Partial resection of right SFA stent   Objective: Vital signs in last 24 hours: Temp:  [97.7 F (36.5 C)-99.5 F (37.5 C)] 98.3 F (36.8 C) (01/17 1250) Pulse Rate:  [96-130] 112 (01/17 1700) Resp:  [10-31] 28 (01/17 1700) BP: (81-128)/(27-85) 127/75 (01/17 1700) SpO2:  [90 %-99 %] 92 % (01/17 1700) Arterial Line BP: (68-128)/(41-64) 119/53 (01/17 1300)  PHYSICAL EXAM:  General: Alert, cooperative, has nasal oxygen. pale Head: Normocephalic, without obvious abnormality, atraumatic. Eyes: Conjunctivae clear, anicteric sclerae. Pupils are equal Lungs: b/l air entry Heart: Regular rate and rhythm, tachycardia Abdomen: Soft, b/l inguinal wound vac Extremities:rt leg wound vac Left leg dressing not removed  Neurologic: Grossly non-focal  Lab Results Recent Labs    07/17/18 0526 07/18/18 0210 07/18/18 1450  WBC 11.1* 11.3*  --   HGB 7.7* 6.5* 9.5*  HCT 26.4* 21.8*  --   NA 134* 133*  --   K 4.7 4.7  --   CL 99 103  --   CO2 25 24  --   BUN 30* 28*  --   CREATININE 1.04* 0.87  --    Microbiology:  Studies/Results: Dg Chest 1 View  Result Date: 07/18/2018 CLINICAL DATA:  Status post central line placement EXAM: CHEST  1 VIEW COMPARISON:  07/18/2018 FINDINGS: Cardiac shadow is stable. New right jugular central line is noted with the catheter tip in the distal superior vena cava. No pneumothorax is seen. Patchy infiltrative changes again noted in the right upper lobe. Mild hyperinflation is again noted. IMPRESSION: No pneumothorax following central line placement. Patchy infiltrate in the  right upper lobe. Electronically Signed   By: Alcide CleverMark  Lukens M.D.   On: 07/18/2018 07:21   Mr Ankle Left Wo Contrast  Result Date: 07/18/2018 CLINICAL DATA:  Calcaneal osteomyelitis.  Follow-up. EXAM: MRI OF THE LEFT ANKLE WITHOUT CONTRAST TECHNIQUE: Multiplanar, multisequence MR imaging of the ankle was performed. No intravenous contrast was administered. COMPARISON:  Foot radiographs 07/20/2018. Left forefoot MRI 05/24/2018. FINDINGS: Previous examination included most of the foot, but not the calcaneal tuberosity. TENDONS Peroneal: Intact and normally positioned. Posteromedial: Intact and normally positioned. Trace fluid in the posterior tibialis tendon sheath. Anterior: Intact and normally positioned. Achilles: Intact.  Trace fluid in the retrocalcaneal bursa. Plantar Fascia: Intact. LIGAMENTS Lateral: The anterior and posterior talofibular and calcaneofibular ligaments are intact. Medial: The deltoid and visualized portions of the spring ligament appear intact. CARTILAGE AND BONES Ankle Joint: No significant ankle joint effusion. The talar dome and tibial plafond are intact. Subtalar Joints/Sinus Tarsi: Unremarkable. Bones: There is a large area of soft tissue ulceration posterior to the calcaneal tuberosity. There is underlying prominent marrow T2 hyperintensity in the calcaneal tuberosity. In addition, there is associated decreased T1 signal, but no definite cortical destruction. The additional bones appear unremarkable. Other: As above, large area of soft tissue ulceration posterior to the calcaneal tuberosity without underlying fluid collection. There is diffuse subcutaneous edema surrounding the ankle and extending into the dorsum of the midfoot and forefoot. No focal fluid collections are seen. Generalized T2 hyperintensity throughout the foot  musculature is again noted, likely chronic myopathy. IMPRESSION: 1. Large area of soft tissue ulceration over the posterior aspect of the heel with underlying  marrow T2 hyperintensity and T1 hypointensity in the calcaneal tuberosity consistent with osteomyelitis. This area was not previously imaged. 2. Generalized cellulitis and muscular T2 hyperintensity. No focal fluid collection. 3. No significant ankle tendon or ligament pathology. Electronically Signed   By: Carey BullocksWilliam  Veazey M.D.   On: 07/18/2018 14:51   Dg Chest Port 1 View  Result Date: 07/18/2018 CLINICAL DATA:  Leukocytosis EXAM: PORTABLE CHEST 1 VIEW COMPARISON:  07/16/2018 FINDINGS: Mild central right upper lobe opacity, suspicious for pneumonia. Lungs are otherwise clear. No pleural effusion or pneumothorax. The heart is normal in size. IMPRESSION: Mild right upper lobe opacity, suspicious for pneumonia. Electronically Signed   By: Charline BillsSriyesh  Krishnan M.D.   On: 07/18/2018 03:15   Dg C-arm 1-60 Min-no Report  Result Date: 07/17/2018 Fluoroscopy was utilized by the requesting physician.  No radiographic interpretation.     Assessment/Plan: ? Bilateral foot infection secondary to diabetes and peripheral vascular disease.  Status post right metatarsal amputation and debridement of the ulcer.  Has a wound VAC.  MRSA in the culture. On vanco- will need for a few weeks  Left heel eschar  .  Severe PAD. significant plaque in bilateral common femoral, profunda femoris arteries and the left proximal SFA. Underwent Left common femoral, profunda femoris, and superficial femoral artery endarterectomies 2.   Right common femoral and profunda femoris endarterectomies  On 07/17/18 by Dr.Dew  In ICU for borderline low BP and tachcyardia- cefepime added to vanco- if blood culture remains neg may be able to DC soon     Diabetes mellitus.  Poorly controlled, recent A1c was 10.9.  On Lantus and NovoLog.  Management as per primary team  COPD current smoker.  Urinary retention has a urethral catheter and is on tamsulosin.  Not sure what her underlying pathology ?neurogenic bladder secondary  diabetes  Anemia of chronic disease Protein calorie malnutrition  Head lice noted by nursing staff when she got permethrin  Recurrent DVT on Xarelto   Discussed the management with her nurse

## 2018-07-18 NOTE — Progress Notes (Signed)
Elvina SidleKeene, NP at bedside for line placement.   1st unit PRBC infusing.  NS rate at 750ml/hr during transfusion per Elvina SidleKeene, NP.  Heparin gtt paused >1hr for line placement.

## 2018-07-18 NOTE — Progress Notes (Addendum)
Pharmacy Antibiotic Note  Marybelle KillingsDoris J Aydelotte is a 63 y.o. female admitted on 07/09/2018 with Wound infection.  Pharmacy has been consulted for vancomycin dosing. During an admission here in November she received vancomycin for the same indication and her regimen was 1 gram every 24 hours resulting in a Vt of 12 mcg/mL. Vancomycin dose was started at a dose of 1000 mg every 24 hours (1st dose was 750mg ). A vancomycin peak level was drawn at steady-state and a random level the next morning were used to create a pharmacokinetic profile. AUC of 580 was calculated.   Plan: 1/17 Scr increased. Vancomycin dose changed to 750mg  IV every 24 hours. Goal AUC 400-550. Expected AUC: 470 SCr used: 0.86  Will continue to monitor renal function and adjust dose as needed. Will order post distribution vanc level and vanc trough 1/19-1/20  Continue patient on cefepime 1g IV q12h per CrCl 26 - 50 ml/min  Height: 5\' 2"  (157.5 cm) Weight: 105 lb 6.1 oz (47.8 kg) IBW/kg (Calculated) : 50.1  Temp (24hrs), Avg:98.4 F (36.9 C), Min:97.5 F (36.4 C), Max:99.5 F (37.5 C)  Recent Labs  Lab 07/13/18 0159 07/13/18 2056 07/14/18 0506 07/14/18 1432 07/14/18 1656 07/15/18 0609 07/16/18 0331 07/16/18 1737 07/17/18 0526 07/18/18 0210  WBC 8.1  --  8.2  --   --   --   --  13.0* 11.1* 11.3*  CREATININE  --   --   --  0.82  --  0.68 0.66 0.76 1.04* 0.87  VANCOTROUGH  --   --   --   --  17  --   --   --   --   --   VANCOPEAK  --  32  --   --   --   --   --   --   --   --   VANCORANDOM  --   --   --  17  --   --   --   --   --   --     Estimated Creatinine Clearance: 49.9 mL/min (by C-G formula based on SCr of 0.87 mg/dL).    Antimicrobials this admission: 1/8 vancomcyin >>  1/8 pip/tazo >> 1/12 1/17 cefepime>>   Dose adjustments this admission: 1/9 Vancomycin 750mg  every 36 hours-----> 1000mg  q24h 1/13 vancomycin 1000mg  q24h----> 1250mg  q24h (dose not administered) 1/14 vancomycin 1250mg  q24h----> 1000mg   q24h 1/17 Vancomycin 1000 mg q24h ----> 750mg  q24h  Microbiology results: 1/8 Heel Cx: MRSA  Thank you for allowing pharmacy to be a part of this patient's care.  Gardner CandleSheema M Alysia Scism, PharmD, BCPS Clinical Pharmacist 07/18/2018 11:47 AM

## 2018-07-18 NOTE — Progress Notes (Signed)
Per Dr. Wyn Quaker restart Heparin drip.

## 2018-07-18 NOTE — Progress Notes (Signed)
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  Allergies  Allergen Reactions  . Eggs Or Egg-Derived Products Diarrhea, Itching and Nausea And Vomiting    Patient Measurements: Height: 5\' 2"  (157.5 cm) Weight: 105 lb 6.1 oz (47.8 kg) IBW/kg (Calculated) : 50.1 Heparin Dosing Weight: 36.6 kg - reported by Jacqulyn Cane RN  Vital Signs: Temp: 98.3 F (36.8 C) (01/17 1250) Temp Source: Axillary (01/17 1250) BP: 127/75 (01/17 1700) Pulse Rate: 112 (01/17 1700)  Labs: Recent Labs    07/16/18 1737 07/17/18 0215 07/17/18 0526 07/17/18 1123 07/18/18 0210 07/18/18 1450 07/18/18 1751  HGB 7.4*  --  7.7*  --  6.5* 9.5*  --   HCT 24.7*  --  26.4*  --  21.8*  --   --   PLT 161  --  185  --  172  --   --   APTT  --   --  73*  --   --   --   --   LABPROT  --   --  14.8  --   --   --   --   INR  --   --  1.17  --   --   --   --   HEPARINUNFRC  --   --  0.21* <0.10* 0.41  --  0.23*  CREATININE 0.76  --  1.04*  --  0.87  --   --   TROPONINI 0.11* 1.19*  --   --  0.40*  --   --     Estimated Creatinine Clearance: 49.9 mL/min (by C-G formula based on SCr of 0.87 mg/dL).   Assessment: Pharmacy was consulted for heparin.  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 Will follow aPTT.   Goal of Therapy:  APTT level 66-102 seconds or Heparin level 0.3-0.7 units/ml once heparin level correlates with aPTT  Monitor platelets by anticoagulation protocol: Yes  Heparin Course: 1/13 AM heparin level 0.24. 600 unit  bolus and increase rate t o 850 units/hr. Recheck in 6 hours.  1/13:  HL @ 2055 = 0.17 .   Heparin was not held during any point on 1/13, angiogram was not done.   Will order heparin 1750 units IV X 1 once and increase drip rate to 1050 units/hr.  Will recheck HL 6 hrs after rate change.  1/14 Patient had angiogram with  heparin on hold. 01/17 @ 0200 HL 0.41 therapeutic. Will continue current rate and will recheck HL @ 0800, Hgb now below 7 @ 6.5 will continue to monitor.  01/17 @ 0500 patient's heparin drip to be held for central line placement will retime HL for 1200.  Plan:  07/18/2018 17:51 HL subtherapeutic x 1. Increase to 1250 units/hr. Per note we are still not giving bolus doses so we will increase the rate only. Recheck HL in 6 hours.  Carola Frost, PharmD Clinical Pharmacist 07/18/2018,6:36 PM

## 2018-07-18 NOTE — Progress Notes (Signed)
PALLIATIVE NOTE:   Referral received for goals of care discussion. Went in to see patient. She was somewhat lethargic, but arousable. Received Dilaudid and ativan during the night and early morning. Unable to stay awake and engage appropriately for goals of care discussion.   Left voicemail with HIPAA appropriate message for daughter Lucius Conn [phone]).   Attempted to follow back up with patient to see if she was more awake this afternoon. Patient was down having MRI.   Additional attempts to reach daughter unsuccessful.   Palliative will follow up with patient and/or family on Monday if she remains hospitalized. No weekend coverage.   Detailed notes and recommendations to follow goals of care discussion.   Thank you for your referral.   Willette Alma, AGPCNP-BC Palliative Medicine Team  Pager: [phone] Amion: N. Cousar   NO CHARGE

## 2018-07-18 NOTE — Progress Notes (Addendum)
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  Allergies  Allergen Reactions  . Eggs Or Egg-Derived Products Diarrhea, Itching and Nausea And Vomiting    Patient Measurements: Height: 5\' 2"  (157.5 cm) Weight: 105 lb 6.1 oz (47.8 kg) IBW/kg (Calculated) : 50.1 Heparin Dosing Weight: 36.6 kg - reported by Jacqulyn Cane RN  Vital Signs: Temp: 98.1 F (36.7 C) (01/17 0000) Temp Source: Axillary (01/17 0000) BP: 100/60 (01/17 0310) Pulse Rate: 104 (01/17 0310)  Labs: Recent Labs    07/16/18 1439  07/16/18 1737 07/17/18 0215 07/17/18 0526 07/17/18 1123 07/18/18 0210  HGB  --    < > 7.4*  --  7.7*  --  6.5*  HCT  --   --  24.7*  --  26.4*  --  21.8*  PLT  --   --  161  --  185  --  172  APTT  --   --   --   --  73*  --   --   LABPROT  --   --   --   --  14.8  --   --   INR  --   --   --   --  1.17  --   --   HEPARINUNFRC  --   --   --   --  0.21* <0.10* 0.41  CREATININE  --   --  0.76  --  1.04*  --  0.87  TROPONINI <0.03  --  0.11* 1.19*  --   --   --    < > = values in this interval not displayed.    Estimated Creatinine Clearance: 49.9 mL/min (by C-G formula based on SCr of 0.87 mg/dL).   Assessment: Pharmacy was consulted for heparin.  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 Will follow aPTT.   Goal of Therapy:  APTT level 66-102 seconds or Heparin level 0.3-0.7 units/ml once heparin level correlates with aPTT  Monitor platelets by anticoagulation protocol: Yes  Heparin Course: 1/13 AM heparin level 0.24. 600 unit  bolus and increase rate t o 850 units/hr. Recheck in 6 hours.  1/13:  HL @ 2055 = 0.17 .   Heparin was not held during any point on 1/13, angiogram was not done.   Will order heparin 1750 units IV X 1 once and increase drip rate to 1050 units/hr.  Will recheck HL 6 hrs after rate change.   1/14 Patient had angiogram with heparin on hold.  Plan:  01/17 @ 0200 HL 0.41 therapeutic. Will continue current rate and will recheck HL @ 0800, Hgb now below 7 @ 6.5 will continue to monitor.  01/17 @ 0500 patient's heparin drip to be held for central line placement will retime HL for 1200.  Thomasene Ripple, PharmD Clinical Pharmacist 07/18/2018,3:28 AM

## 2018-07-18 NOTE — Progress Notes (Signed)
Pharmacy Antibiotic Note  April [name] is a 63 y.o. female admitted on 07/09/2018 with pneumonia.  Pharmacy has been consulted for cefepime dosing.  Plan: Will start patient on cefepime 1g IV q12h per CrCl 26 - 50 ml/min  Height: 5\' 2"  (157.5 cm) Weight: 105 lb 6.1 oz (47.8 kg) IBW/kg (Calculated) : 50.1  Temp (24hrs), Avg:97.9 F (36.6 C), Min:97.5 F (36.4 C), Max:98.4 F (36.9 C)  Recent Labs  Lab 07/13/18 0159 07/13/18 2056 07/14/18 0506 07/14/18 1432 07/14/18 1656 07/15/18 0609 07/16/18 0331 07/16/18 1737 07/17/18 0526 07/18/18 0210  WBC 8.1  --  8.2  --   --   --   --  13.0* 11.1* 11.3*  CREATININE  --   --   --  0.82  --  0.68 0.66 0.76 1.04* 0.87  VANCOTROUGH  --   --   --   --  17  --   --   --   --   --   VANCOPEAK  --  32  --   --   --   --   --   --   --   --   VANCORANDOM  --   --   --  17  --   --   --   --   --   --     Estimated Creatinine Clearance: 49.9 mL/min (by C-G formula based on SCr of 0.87 mg/dL).    Allergies  Allergen Reactions  . Eggs Or Egg-Derived Products Diarrhea, Itching and Nausea And Vomiting    Thank you for allowing pharmacy to be a part of this patient's care.  Thomasene Ripple, PharmD, BCPS Clinical Pharmacist 07/18/2018

## 2018-07-18 NOTE — Progress Notes (Signed)
CRITICAL CARE NOTE  CC  follow up severe LE ischemia s/p endarterectomies SUBJECTIVE C/o pain ion both feet Got ativan last night and was very sleepy and less responsive but more awake now and follows simple command    SIGNIFICANT EVENTS    BP 107/69   Pulse (!) 106   Temp 98.7 F (37.1 C) (Axillary)   Resp 20   Ht 5\' 2"  (1.575 m)   Wt 47.8 kg   SpO2 96%   BMI 19.27 kg/m    REVIEW OF SYSTEMS  PATIENT IS UNABLE TO PROVIDE COMPLETE REVIEW OF SYSTEMS DUE TO SEVERE CRITICAL ILLNESS   PHYSICAL EXAMINATION:  GENERAL:critically ill appearing, no resp distress HEAD: Normocephalic, atraumatic.  EYES: Pupils equal, round, reactive to light.  No scleral icterus.  MOUTH: Moist mucosal membrane. NECK: Supple. No thyromegaly. No nodules. No JVD.  PULMONARY: CTA without wheezing CARDIOVASCULAR: S1 and S2. Regular rate and rhythm. No murmurs, rubs, or gallops.  GASTROINTESTINAL: Soft, nontender, -distended. No masses. Positive bowel sounds. No hepatosplenomegaly.  MUSCULOSKELETAL: Bilateral LE dressing with mild evidence of ischemia with wound dressings and drain in the R foot   NEUROLOGIC:oens eyes follows simple commands SKIN:intact,warm,dry  INTAKE/OUTPUT  Intake/Output Summary (Last 24 hours) at 07/18/2018 1215 Last data filed at 07/18/2018 0900 Gross per 24 hour  Intake 3370.75 ml  Output 875 ml  Net 2495.75 ml    LABS  CBC Recent Labs  Lab 07/16/18 1737 07/17/18 0526 07/18/18 0210  WBC 13.0* 11.1* 11.3*  HGB 7.4* 7.7* 6.5*  HCT 24.7* 26.4* 21.8*  PLT 161 185 172   Coag's Recent Labs  Lab 07/12/18 1326 07/12/18 1945 07/17/18 0526  APTT 53* 76* 73*  INR  --   --  1.17   BMET Recent Labs  Lab 07/16/18 1737 07/17/18 0526 07/18/18 0210  NA 135 134* 133*  K 4.9 4.7 4.7  CL 101 99 103  CO2 26 25 24   BUN 23 30* 28*  CREATININE 0.76 1.04* 0.87  GLUCOSE 152* 260* 234*   Electrolytes Recent Labs  Lab 07/15/18 0609  07/16/18 1737 07/17/18 0526  07/18/18 0210  CALCIUM 8.4*   < > 8.1* 8.3* 7.9*  MG 2.2  --  1.9 2.1  --    < > = values in this interval not displayed.   Sepsis Markers Recent Labs  Lab 07/18/18 0210  PROCALCITON 0.77   ABG No results for input(s): PHART, PCO2ART, PO2ART in the last 168 hours. Liver Enzymes No results for input(s): AST, ALT, ALKPHOS, BILITOT, ALBUMIN in the last 168 hours. Cardiac Enzymes Recent Labs  Lab 07/16/18 1737 07/17/18 0215 07/18/18 0210  TROPONINI 0.11* 1.19* 0.40*   Glucose Recent Labs  Lab 07/17/18 1403 07/17/18 1749 07/17/18 2150 07/17/18 2351 07/18/18 0735 07/18/18 1209  GLUCAP 139* 131* 159* 213* 198* 149*     Recent Results (from the past 240 hour(s))  Aerobic/Anaerobic Culture (surgical/deep wound)     Status: None   Collection Time: 07/09/18  2:31 PM  Result Value Ref Range Status   Specimen Description   Final    HEEL Performed at [facility], [address], [place], [place] [address]    Special Requests   Final    NONE Performed at [facility], [address] [place], [place] [address]    Gram Stain   Final    MODERATE WBC PRESENT, PREDOMINANTLY PMN ABUNDANT GRAM POSITIVE COCCI    Culture   Final    MODERATE METHICILLIN RESISTANT  STAPHYLOCOCCUS AUREUS NO ANAEROBES ISOLATED Performed at [facility], [address] [place], [place] [address]    Report Status 07/14/2018 FINAL  Final   Organism ID, Bacteria METHICILLIN RESISTANT STAPHYLOCOCCUS AUREUS  Final      Susceptibility   Methicillin resistant staphylococcus aureus - MIC*    CIPROFLOXACIN >=8 RESISTANT Resistant     ERYTHROMYCIN >=8 RESISTANT Resistant     GENTAMICIN <=0.5 SENSITIVE Sensitive     OXACILLIN >=4 RESISTANT Resistant     TETRACYCLINE <=1 SENSITIVE Sensitive     VANCOMYCIN 1 SENSITIVE Sensitive     TRIMETH/SULFA <=10 SENSITIVE Sensitive     CLINDAMYCIN <=0.25 SENSITIVE Sensitive     RIFAMPIN <=0.5 SENSITIVE Sensitive     Inducible Clindamycin  NEGATIVE Sensitive     * MODERATE METHICILLIN RESISTANT STAPHYLOCOCCUS AUREUS  MRSA PCR Screening     Status: Abnormal   Collection Time: 07/10/18  6:21 PM  Result Value Ref Range Status   MRSA by PCR POSITIVE (A) NEGATIVE Final    Comment:        The GeneXpert MRSA Assay (FDA approved for NASAL specimens only), is one component of a comprehensive MRSA colonization surveillance program. It is not intended to diagnose MRSA infection nor to guide or monitor treatment for MRSA infections. RESULT CALLED TO, READ BACK BY AND VERIFIED WITH: KRISTY BASS 07/10/18 1957 KLW Performed at [facility], [address] [place], [place] [address]   Aerobic/Anaerobic Culture (surgical/deep wound)     Status: None   Collection Time: 07/11/18 12:40 PM  Result Value Ref Range Status   Specimen Description   Final    ULCER RIGHT FOOT WOUND Performed at [facility], [address] [place], [place] [address]    Special Requests   Final    NONE Performed at [facility], [address] [place], [place] [address]    Gram Stain NO WBC SEEN NO ORGANISMS SEEN   Final   Culture   Final    MODERATE METHICILLIN RESISTANT STAPHYLOCOCCUS AUREUS MODERATE STREPTOCOCCUS PYOGENES NO ANAEROBES ISOLATED Performed at [facility], [address] [place], [place] [address]    Report Status 07/16/2018 FINAL  Final   Organism ID, Bacteria METHICILLIN RESISTANT STAPHYLOCOCCUS AUREUS  Final      Susceptibility   Methicillin resistant staphylococcus aureus - MIC*    CIPROFLOXACIN >=8 RESISTANT Resistant     ERYTHROMYCIN <=0.25 SENSITIVE Sensitive     GENTAMICIN <=0.5 SENSITIVE Sensitive     OXACILLIN >=4 RESISTANT Resistant     TETRACYCLINE <=1 SENSITIVE Sensitive     VANCOMYCIN 1 SENSITIVE Sensitive     TRIMETH/SULFA <=10 SENSITIVE Sensitive     CLINDAMYCIN <=0.25 SENSITIVE Sensitive     RIFAMPIN <=0.5 SENSITIVE Sensitive     Inducible Clindamycin NEGATIVE Sensitive      * MODERATE METHICILLIN RESISTANT STAPHYLOCOCCUS AUREUS  CULTURE, BLOOD (ROUTINE X 2) w Reflex to ID Panel     Status: None (Preliminary result)   Collection Time: 07/18/18  4:30 AM  Result Value Ref Range Status   Specimen Description BLOOD BLOOD RIGHT FOREARM  Final   Special Requests   Final    BOTTLES DRAWN AEROBIC AND ANAEROBIC Blood Culture adequate volume   Culture   Final    NO GROWTH <12 HOURS Performed at [facility], [address] [place], [place] [address]    Report Status PENDING  Incomplete  CULTURE, BLOOD (ROUTINE X 2) w Reflex to ID  Panel     Status: None (Preliminary result)   Collection Time: 07/18/18  4:30 AM  Result Value Ref Range Status   Specimen Description BLOOD RIGHT ANTECUBITAL  Final   Special Requests   Final    BOTTLES DRAWN AEROBIC AND ANAEROBIC Blood Culture adequate volume   Culture   Final    NO GROWTH < 12 HOURS Performed at [facility], [address] [place], [place] [address]    Report Status PENDING  Incomplete    MEDICATIONS   Current Facility-Administered Medications:  .  0.9 %  sodium chloride infusion (Manually program via Guardrails IV Fluids), , Intravenous, Once, Harlon DittyKeene, Jeremiah D, NP, Last Rate: 10 mL/hr at 07/18/18 0431 .  0.9 %  sodium chloride infusion (Manually program via Guardrails IV Fluids), , Intravenous, Once, Harlon DittyKeene, Jeremiah D, NP .  0.9 %  sodium chloride infusion, , Intravenous, PRN, Annice Needyew, Jason S, MD, Last Rate: 5 mL/hr at 07/17/18 2330 .  0.9 %  sodium chloride infusion, , Intravenous, Continuous, Harlon DittyKeene, Jeremiah D, NP, Last Rate: 50 mL/hr at 07/18/18 0600 .  acetaminophen (TYLENOL) tablet 650 mg, 650 mg, Oral, Q6H PRN, Annice Needyew, Jason S, MD, 650 mg at 07/16/18 1710 .  albuterol (PROVENTIL) (2.5 MG/3ML) 0.083% nebulizer solution 2.5 mg, 2.5 mg, Inhalation, Q6H PRN, Annice Needyew, Jason S, MD, 2.5 mg at 07/16/18 1609 .  ALPRAZolam (XANAX) tablet 0.5 mg, 0.5 mg, Oral, BID PRN, Annice Needyew, Jason S, MD .  aspirin EC  tablet 81 mg, 81 mg, Oral, Daily, Dew, Marlow BaarsJason S, MD .  atorvastatin (LIPITOR) tablet 40 mg, 40 mg, Oral, q1800, Annice Needyew, Jason S, MD, 40 mg at 07/16/18 2008 .  ceFEPIme (MAXIPIME) 1 g in sodium chloride 0.9 % 100 mL IVPB, 1 g, Intravenous, Q12H, Auburn BilberryPatel, Shreyang, MD, Stopped at 07/18/18 0505 .  ferrous sulfate tablet 325 mg, 325 mg, Oral, BID WC, Dew, Marlow BaarsJason S, MD, 325 mg at 07/17/18 0907 .  furosemide (LASIX) tablet 40 mg, 40 mg, Oral, Daily, Annice Needyew, Jason S, MD, 40 mg at 07/17/18 0907 .  guaiFENesin-dextromethorphan (ROBITUSSIN DM) 100-10 MG/5ML syrup 5 mL, 5 mL, Oral, Q4H PRN, Wyn Quakerew, Marlow BaarsJason S, MD .  heparin ADULT infusion 100 units/mL (25000 units/28350mL sodium chloride 0.45%), 1,150 Units/hr, Intravenous, Continuous, Dew, Marlow BaarsJason S, MD, Stopped at 07/18/18 0427 .  HYDROcodone-acetaminophen (NORCO/VICODIN) 5-325 MG per tablet 1-2 tablet, 1-2 tablet, Oral, Q6H PRN, Annice Needyew, Jason S, MD, 1 tablet at 07/17/18 2150 .  insulin aspart (novoLOG) injection 0-15 Units, 0-15 Units, Subcutaneous, TID WC, Annice Needyew, Jason S, MD, 3 Units at 07/18/18 [phone] .  insulin aspart (novoLOG) injection 0-5 Units, 0-5 Units, Subcutaneous, QHS, Annice Needyew, Jason S, MD, 3 Units at 07/16/18 2307 .  insulin aspart (novoLOG) injection 3 Units, 3 Units, Subcutaneous, TID WC, Annice Needyew, Jason S, MD, 3 Units at 07/18/18 0859 .  insulin glargine (LANTUS) injection 10 Units, 10 Units, Subcutaneous, QHS, Annice Needyew, Jason S, MD, 10 Units at 07/17/18 2156 .  ipratropium-albuterol (DUONEB) 0.5-2.5 (3) MG/3ML nebulizer solution 3 mL, 3 mL, Nebulization, Once, Dew, Marlow BaarsJason S, MD .  MEDLINE mouth rinse, 15 mL, Mouth Rinse, BID, Dew, Marlow BaarsJason S, MD .  metoprolol tartrate (LOPRESSOR) injection 5 mg, 5 mg, Intravenous, Q6H, Dew, Marlow BaarsJason S, MD, 5 mg at 07/18/18 0211 .  midodrine (PROAMATINE) tablet 10 mg, 10 mg, Oral, TID WC, Annice Needyew, Jason S, MD, 10 mg at 07/17/18 0906 .  mometasone-formoterol (DULERA) 200-5 MCG/ACT inhaler 2 puff, 2 puff, Inhalation, BID, Dew, Marlow BaarsJason S, MD, 2 puff at  07/17/18  2150 .  morphine 4 MG/ML injection 4 mg, 4 mg, Intravenous, Q4H PRN, Annice Needy, MD, 4 mg at 07/17/18 1224 .  multivitamin-lutein (OCUVITE-LUTEIN) capsule 1 capsule, 1 capsule, Oral, Daily, Dew, Marlow Baars, MD, 1 capsule at 07/13/18 1248 .  nicotine (NICODERM CQ - dosed in mg/24 hours) patch 14 mg, 14 mg, Transdermal, Daily PRN, Wyn Quaker, Marlow Baars, MD .  norepinephrine (LEVOPHED) 4mg  in NS premix infusion, 0-40 mcg/min, Intravenous, Titrated, Harlon Ditty D, NP, Last Rate: 30 mL/hr at 07/18/18 0600, 8 mcg/min at 07/18/18 0600 .  ondansetron (ZOFRAN) injection 4 mg, 4 mg, Intravenous, Q6H PRN, Annice Needy, MD, 4 mg at 07/18/18 1034 .  ondansetron (ZOFRAN) injection 4 mg, 4 mg, Intravenous, Q6H PRN, Dew, Marlow Baars, MD .  pantoprazole (PROTONIX) EC tablet 40 mg, 40 mg, Oral, BID, Dew, Marlow Baars, MD, 40 mg at 07/17/18 2150 .  polyethylene glycol (MIRALAX / GLYCOLAX) packet 17 g, 17 g, Oral, Daily PRN, Wyn Quaker, Marlow Baars, MD .  polyethylene glycol (MIRALAX / GLYCOLAX) packet 17 g, 17 g, Oral, Daily, Dew, Marlow Baars, MD, Stopped at 07/12/18 1051 .  prochlorperazine (COMPAZINE) injection 5 mg, 5 mg, Intravenous, Q6H PRN, Dew, Marlow Baars, MD .  protein supplement (ENSURE MAX) liquid, 11 oz, Oral, BID, Dew, Marlow Baars, MD, 11 oz at 07/16/18 2127 .  tamsulosin (FLOMAX) capsule 0.4 mg, 0.4 mg, Oral, Daily, Dew, Marlow Baars, MD, 0.4 mg at 07/17/18 0906 .  tiotropium (SPIRIVA) inhalation capsule ([facility] use ONLY) 18 mcg, 18 mcg, Inhalation, Daily, Annice Needy, MD, 18 mcg at 07/15/18 [phone] .  vancomycin (VANCOCIN) IVPB 750 mg/150 ml premix, 750 mg, Intravenous, Q24H, Hallaji, Sheema M, RPH .  vitamin C (ASCORBIC ACID) tablet 500 mg, 500 mg, Oral, BID, Dew, Marlow Baars, MD, 500 mg at 07/17/18 0907      Indwelling Urinary Catheter continued, requirement due to   Reason to continue Indwelling Urinary Catheter for strict Intake/Output monitoring for hemodynamic instability   Central Line continued, requirement due to   Reason to  continue [employer] Monitoring of central venous pressure or other hemodynamic parameters   Ventilator continued, requirement due to, resp failure    Ventilator Sedation RASS 0 to -2     ASSESSMENT AND PLAN Bilateral infected Diabetic foot wounds, with right foot osteomyelitis Wound cultures w/ MRSA Meets SIRS Criteria (with new tachycardia and hypotension) Severe Peripheral Vascular Disease -Monitor fever curve -Trend WBC's and Procalcitonin -cont Vancomycin -follow cultures -Obtain CXR -  -Podiatry and Vascular surgery following, appreciate input -S/p debridement of R foot on 1/10 -S/p angioplasty with stent placement of the right superficial femoral artery on 1/14 -S/p bilateral femoral enterectomies on 1/16 See vascular note for full surgical details  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 Hx: PAD, HLD -Cardiac monitoring -Maintain MAP >65  -Continue Midodrine -Levophed if needed to maintain MAP goal -IVF: NS @ 50 ml/r -Heparin drip -Cardiology following, Cardiology recommends medical management -Echocardiogram pending  AKI -Monitor I&O's / urinary output -Follow BMP -Ensure adequate renal perfusion -Avoid nephrotoxic agents as able -Replace electrolytes as indicated  COPD without acute Exacerbation CXR on 1/15 with questionable RUL infiltrate  Hx: Smoker -Supplemental O2 as needed to maintain O2 sats 88 to 94% -Intermittent CXR and ABG as needed, obtain CXR 1/17 -follow sputum culture -Continue Dulera and Spiriva -Prn Bronchodilators -Incentive Spirometry  Diabetes Mellitus with Hyperglycemia -CBG's -SSI and Lantus -Follow ICU Hypo/hyperglycemia protocol -Will D/c D5NS to NS  Anemia 2 units of blood transfused Monitor for S/Sx of bleeding Trend CBC Transfuse for Hgb <7    DISPOSITION: ICU GOALS OF CARE: Partial code: No intubation VTE PROPHYLAXIS: Heparin drip UPDATES: Updated pt at bedside July 28, 2018     Time  spent 35 minutes    Overall, patient is critically ill, prognosis is guarded.  Patient with Multiorgan failure and at high risk for cardiac arrest and death.   Arbie Cookey, MD  Jul 28, 2018 12:15 PM [facility]

## 2018-07-18 NOTE — Progress Notes (Signed)
PT Cancellation Note  Patient Details Name: April [name] MRN: [id number] DOB: Sep 21, 1955   Cancelled Treatment:    Reason Eval/Treat Not Completed: Medical issues which prohibited therapy.  Pt s/p B femoral endarterectomies 07/17/18 and transferred to ICU post procedure.  PT continue at transfer order received.  Pt's Hgb noted to be 6.5 this morning.  Will hold PT at this time per PT guidelines for low Hgb.  Will re-attempt PT re-evaluation at a later date/time as medically appropriate.  Hendricks Limes, PT 07/18/18, 11:24 AM [phone]

## 2018-07-18 NOTE — Procedures (Signed)
Central Venous Catheter Insertion Procedure Note April [name] [id number] 12/18/1955  Procedure: Insertion of Central Venous Catheter Indications: Assessment of intravascular volume, Drug and/or fluid administration and Frequent blood sampling  Procedure Details Consent: Risks of procedure as well as the alternatives and risks of each were explained to the (patient/caregiver).  Consent for procedure obtained. Time Out: Verified patient identification, verified procedure, site/side was marked, verified correct patient position, special equipment/implants available, medications/allergies/relevent history reviewed, required imaging and test results available.  Performed  Maximum sterile technique was used including antiseptics, cap, gloves, gown, hand hygiene, mask and sheet. Skin prep: Chlorhexidine; local anesthetic administered A antimicrobial bonded/coated triple lumen catheter was placed in the right internal jugular vein using the Seldinger technique.  Evaluation Blood flow good Complications: No apparent complications Patient did tolerate procedure well. Chest X-ray ordered to verify placement.  CXR: pending.   Procedure was performed using ultrasound for real time visualization of vessel cannulization.  April [name], AGACNP-BC [facility] Pager: [phone] Cell: [phone]   April [name] 07/18/2018, 6:51 AM

## 2018-07-19 ENCOUNTER — Inpatient Hospital Stay: Payer: Medicare HMO

## 2018-07-19 LAB — CBC WITH DIFFERENTIAL/PLATELET
Abs Immature Granulocytes: 0.1 10*3/uL — ABNORMAL HIGH (ref 0.00–0.07)
Basophils Absolute: 0 10*3/uL (ref 0.0–0.1)
Basophils Relative: 0 %
EOS PCT: 1 %
Eosinophils Absolute: 0.1 10*3/uL (ref 0.0–0.5)
HCT: 28.9 % — ABNORMAL LOW (ref 36.0–46.0)
Hemoglobin: 8.9 g/dL — ABNORMAL LOW (ref 12.0–15.0)
Immature Granulocytes: 1 %
Lymphocytes Relative: 16 %
Lymphs Abs: 1.9 10*3/uL (ref 0.7–4.0)
MCH: 29.2 pg (ref 26.0–34.0)
MCHC: 30.8 g/dL (ref 30.0–36.0)
MCV: 94.8 fL (ref 80.0–100.0)
Monocytes Absolute: 0.9 10*3/uL (ref 0.1–1.0)
Monocytes Relative: 7 %
Neutro Abs: 9.5 10*3/uL — ABNORMAL HIGH (ref 1.7–7.7)
Neutrophils Relative %: 75 %
Platelets: 186 10*3/uL (ref 150–400)
RBC: 3.05 MIL/uL — ABNORMAL LOW (ref 3.87–5.11)
RDW: 15.9 % — ABNORMAL HIGH (ref 11.5–15.5)
WBC: 12.6 10*3/uL — ABNORMAL HIGH (ref 4.0–10.5)
nRBC: 0.4 % — ABNORMAL HIGH (ref 0.0–0.2)

## 2018-07-19 LAB — TYPE AND SCREEN
ABO/RH(D): B POS
Antibody Screen: NEGATIVE
Unit division: 0
Unit division: 0
Unit division: 0

## 2018-07-19 LAB — BPAM RBC
Blood Product Expiration Date: 202002012359
Blood Product Expiration Date: 202002032359
Blood Product Expiration Date: 202002032359
ISSUE DATE / TIME: 202001170417
ISSUE DATE / TIME: 202001170859
UNIT TYPE AND RH: 7300
Unit Type and Rh: 7300
Unit Type and Rh: 7300

## 2018-07-19 LAB — GLUCOSE, CAPILLARY
Glucose-Capillary: 95 mg/dL (ref 70–99)
Glucose-Capillary: 146 mg/dL — ABNORMAL HIGH (ref 70–99)
Glucose-Capillary: 190 mg/dL — ABNORMAL HIGH (ref 70–99)
Glucose-Capillary: 113 mg/dL — ABNORMAL HIGH (ref 70–99)

## 2018-07-19 LAB — BASIC METABOLIC PANEL
Anion gap: 6 (ref 5–15)
BUN: 33 mg/dL — ABNORMAL HIGH (ref 8–23)
CHLORIDE: 107 mmol/L (ref 98–111)
CO2: 20 mmol/L — ABNORMAL LOW (ref 22–32)
CREATININE: 0.86 mg/dL (ref 0.44–1.00)
Calcium: 7.5 mg/dL — ABNORMAL LOW (ref 8.9–10.3)
GFR calc Af Amer: >60 mL/min (ref >60)
GFR calc non Af Amer: >60 mL/min (ref >60)
Glucose, Bld: 131 mg/dL — ABNORMAL HIGH (ref 70–99)
Potassium: 5.3 mmol/L — ABNORMAL HIGH (ref 3.5–5.1)
Sodium: 133 mmol/L — ABNORMAL LOW (ref 135–145)

## 2018-07-19 LAB — PROCALCITONIN: Procalcitonin: 0.88 ng/mL

## 2018-07-19 LAB — URINE CULTURE
Culture: NO GROWTH
Special Requests: NORMAL

## 2018-07-19 LAB — HEPARIN LEVEL (UNFRACTIONATED)
HEPARIN UNFRACTIONATED: 0.67 [IU]/mL (ref 0.30–0.70)
Heparin Unfractionated: 0.44 IU/mL (ref 0.30–0.70)

## 2018-07-19 LAB — PREPARE RBC (CROSSMATCH)

## 2018-07-19 MED ORDER — MORPHINE SULFATE (PF) 2 MG/ML IV SOLN
2.0000 mg | INTRAVENOUS | Status: DC | PRN
  Administered 2018-07-19 – 2018-07-26 (×20): 2 mg via INTRAVENOUS
  Filled 2018-07-19 (×23): qty 1

## 2018-07-19 NOTE — Progress Notes (Addendum)
CRITICAL CARE NOTE  CC  follow up PVD/LE ischemia Diabetic foot ulecres/infection SUBJECTIVE Patient remains critically ill C/o pain at groin site Denies CP/SOB. No n/v, eating little. Denies feet pain    SIGNIFICANT EVENTS none    BP 118/81   Pulse 85   Temp 100 F (37.8 C) (Axillary)   Resp 18   Ht 5\' 2"  (1.575 m)   Wt 48.2 kg   SpO2 97%   BMI 19.44 kg/m    REVIEW OF SYSTEMS  See subjective   PHYSICAL EXAMINATION:  GENERAL:critically ill appearing, no resp distress HEAD: Normocephalic, atraumatic.  EYES: Pupils equal, round, reactive to light.  No scleral icterus.  MOUTH: Moist mucosal membrane. NECK: Supple. No thyromegaly. No nodules. No JVD.  PULMONARY:CTA CARDIOVASCULAR: S1 and S2. Regular rate and rhythm. No murmurs, rubs, or gallops.  GASTROINTESTINAL: Soft, nontender, -distended. No masses. Positive bowel sounds. No hepatosplenomegaly.  MUSCULOSKELETAL: No swelling, clubbing, or edema.  NEUROLOGIC: awake and alert and appropiateSKIN:intact,warm,dry  INTAKE/OUTPUT  Intake/Output Summary (Last 24 hours) at 07/19/2018 0829 Last data filed at 07/19/2018 0400 Gross per 24 hour  Intake 2455.03 ml  Output 700 ml  Net 1755.03 ml    LABS  CBC Recent Labs  Lab 07/17/18 0526 07/18/18 0210 07/18/18 1450 07/19/18 0440  WBC 11.1* 11.3*  --  12.6*  HGB 7.7* 6.5* 9.5* 8.9*  HCT 26.4* 21.8*  --  28.9*  PLT 185 172  --  186   Coag's Recent Labs  Lab 07/12/18 1326 07/12/18 1945 07/17/18 0526  APTT 53* 76* 73*  INR  --   --  1.17   BMET Recent Labs  Lab 07/17/18 0526 07/18/18 0210 07/19/18 0104  NA 134* 133* 133*  K 4.7 4.7 5.3*  CL 99 103 107  CO2 25 24 20*  BUN 30* 28* 33*  CREATININE 1.04* 0.87 0.86  GLUCOSE 260* 234* 131*   Electrolytes Recent Labs  Lab 07/15/18 0609  07/16/18 1737 07/17/18 0526 07/18/18 0210 07/19/18 0104  CALCIUM 8.4*   < > 8.1* 8.3* 7.9* 7.5*  MG 2.2  --  1.9 2.1  --   --    < > = values in this interval  not displayed.   Sepsis Markers Recent Labs  Lab 07/18/18 0210 07/19/18 0104  PROCALCITON 0.77 0.88   ABG No results for input(s): PHART, PCO2ART, PO2ART in the last 168 hours. Liver Enzymes No results for input(s): AST, ALT, ALKPHOS, BILITOT, ALBUMIN in the last 168 hours. Cardiac Enzymes Recent Labs  Lab 07/16/18 1737 07/17/18 0215 07/18/18 0210  TROPONINI 0.11* 1.19* 0.40*   Glucose Recent Labs  Lab 07/18/18 0735 07/18/18 1209 07/18/18 1610 07/18/18 1630 07/18/18 2129 07/19/18 0754  GLUCAP 198* 149* 60* 84 109* 95     Recent Results (from the past 240 hour(s))  Aerobic/Anaerobic Culture (surgical/deep wound)     Status: None   Collection Time: 07/09/18  2:31 PM  Result Value Ref Range Status   Specimen Description   Final    HEEL Performed at [facility], [address], [place], [place] [address]    Special Requests   Final    NONE Performed at [facility], [address] [place], [place] [address]    Gram Stain   Final    MODERATE WBC PRESENT, PREDOMINANTLY PMN ABUNDANT GRAM POSITIVE COCCI    Culture   Final    MODERATE METHICILLIN RESISTANT STAPHYLOCOCCUS AUREUS NO ANAEROBES ISOLATED Performed at [facility], [address] [place], [place] [address]    Report Status 07/14/2018 FINAL  Final   Organism ID, Bacteria METHICILLIN RESISTANT STAPHYLOCOCCUS AUREUS  Final      Susceptibility   Methicillin resistant staphylococcus aureus - MIC*    CIPROFLOXACIN >=8 RESISTANT Resistant     ERYTHROMYCIN >=8 RESISTANT Resistant     GENTAMICIN <=0.5 SENSITIVE Sensitive     OXACILLIN >=4 RESISTANT Resistant     TETRACYCLINE <=1 SENSITIVE Sensitive     VANCOMYCIN 1 SENSITIVE Sensitive     TRIMETH/SULFA <=10 SENSITIVE Sensitive     CLINDAMYCIN <=0.25 SENSITIVE Sensitive     RIFAMPIN <=0.5 SENSITIVE Sensitive     Inducible Clindamycin NEGATIVE Sensitive     * MODERATE METHICILLIN RESISTANT STAPHYLOCOCCUS AUREUS  MRSA PCR  Screening     Status: Abnormal   Collection Time: 07/10/18  6:21 PM  Result Value Ref Range Status   MRSA by PCR POSITIVE (A) NEGATIVE Final    Comment:        The GeneXpert MRSA Assay (FDA approved for NASAL specimens only), is one component of a comprehensive MRSA colonization surveillance program. It is not intended to diagnose MRSA infection nor to guide or monitor treatment for MRSA infections. RESULT CALLED TO, READ BACK BY AND VERIFIED WITH: KRISTY BASS 07/10/18 1957 KLW Performed at [facility], [address] [place], [place] [address]   Aerobic/Anaerobic Culture (surgical/deep wound)     Status: None   Collection Time: 07/11/18 12:40 PM  Result Value Ref Range Status   Specimen Description   Final    ULCER RIGHT FOOT WOUND Performed at [facility], [address] [place], [place] [address]    Special Requests   Final    NONE Performed at [facility], [address] [place], [place] [address]    Gram Stain NO WBC SEEN NO ORGANISMS SEEN   Final   Culture   Final    MODERATE METHICILLIN RESISTANT STAPHYLOCOCCUS AUREUS MODERATE STREPTOCOCCUS PYOGENES NO ANAEROBES ISOLATED Performed at [facility], [address] [place], [place] [address]    Report Status 07/16/2018 FINAL  Final   Organism ID, Bacteria METHICILLIN RESISTANT STAPHYLOCOCCUS AUREUS  Final      Susceptibility   Methicillin resistant staphylococcus aureus - MIC*    CIPROFLOXACIN >=8 RESISTANT Resistant     ERYTHROMYCIN <=0.25 SENSITIVE Sensitive     GENTAMICIN <=0.5 SENSITIVE Sensitive     OXACILLIN >=4 RESISTANT Resistant     TETRACYCLINE <=1 SENSITIVE Sensitive     VANCOMYCIN 1 SENSITIVE Sensitive     TRIMETH/SULFA <=10 SENSITIVE Sensitive     CLINDAMYCIN <=0.25 SENSITIVE Sensitive     RIFAMPIN <=0.5 SENSITIVE Sensitive     Inducible Clindamycin NEGATIVE Sensitive     * MODERATE METHICILLIN RESISTANT STAPHYLOCOCCUS AUREUS  CULTURE, BLOOD (ROUTINE X 2) w  Reflex to ID Panel     Status: None (Preliminary result)   Collection Time: 07/18/18  4:30 AM  Result Value Ref Range Status   Specimen Description BLOOD BLOOD RIGHT FOREARM  Final   Special Requests   Final    BOTTLES DRAWN AEROBIC AND ANAEROBIC Blood Culture adequate volume   Culture   Final    NO GROWTH 1 DAY Performed at [facility], [address] [place], [place] [address]    Report Status PENDING  Incomplete  CULTURE, BLOOD (ROUTINE X 2) w Reflex to ID Panel     Status: None (Preliminary result)   Collection Time:  07/18/18  4:30 AM  Result Value Ref Range Status   Specimen Description BLOOD RIGHT ANTECUBITAL  Final   Special Requests   Final    BOTTLES DRAWN AEROBIC AND ANAEROBIC Blood Culture adequate volume   Culture   Final    NO GROWTH 1 DAY Performed at [facility], [address] [place], [place] [address]    Report Status PENDING  Incomplete    MEDICATIONS   Current Facility-Administered Medications:  .  0.9 %  sodium chloride infusion (Manually program via Guardrails IV Fluids), , Intravenous, Once, Harlon DittyKeene, Jeremiah D, NP, Last Rate: 10 mL/hr at 07/18/18 0431 .  0.9 %  sodium chloride infusion (Manually program via Guardrails IV Fluids), , Intravenous, Once, Harlon DittyKeene, Jeremiah D, NP .  0.9 %  sodium chloride infusion, , Intravenous, PRN, Annice Needyew, Jason S, MD, Last Rate: 5 mL/hr at 07/17/18 2330 .  0.9 %  sodium chloride infusion, , Intravenous, Continuous, Harlon DittyKeene, Jeremiah D, NP, Last Rate: 100 mL/hr at 07/19/18 0400 .  acetaminophen (TYLENOL) tablet 650 mg, 650 mg, Oral, Q6H PRN, Annice Needyew, Jason S, MD, 650 mg at 07/16/18 1710 .  albuterol (PROVENTIL) (2.5 MG/3ML) 0.083% nebulizer solution 2.5 mg, 2.5 mg, Inhalation, Q6H PRN, Annice Needyew, Jason S, MD, 2.5 mg at 07/16/18 1609 .  ALPRAZolam (XANAX) tablet 0.5 mg, 0.5 mg, Oral, BID PRN, Annice Needyew, Jason S, MD .  aspirin EC tablet 81 mg, 81 mg, Oral, Daily, Dew, Marlow BaarsJason S, MD .  atorvastatin (LIPITOR) tablet 40 mg, 40 mg,  Oral, q1800, Annice Needyew, Jason S, MD, 40 mg at 07/18/18 1716 .  ceFEPIme (MAXIPIME) 1 g in sodium chloride 0.9 % 100 mL IVPB, 1 g, Intravenous, Q12H, Auburn BilberryPatel, Shreyang, MD, Stopped at 07/18/18 2207 .  ferrous sulfate tablet 325 mg, 325 mg, Oral, BID WC, Dew, Marlow BaarsJason S, MD, 325 mg at 07/18/18 1716 .  furosemide (LASIX) tablet 40 mg, 40 mg, Oral, Daily, Annice Needyew, Jason S, MD, 40 mg at 07/17/18 0907 .  guaiFENesin-dextromethorphan (ROBITUSSIN DM) 100-10 MG/5ML syrup 5 mL, 5 mL, Oral, Q4H PRN, Wyn Quakerew, Marlow BaarsJason S, MD .  heparin ADULT infusion 100 units/mL (25000 units/25250mL sodium chloride 0.45%), 1,250 Units/hr, Intravenous, Continuous, Auburn BilberryPatel, Shreyang, MD, Last Rate: 12.5 mL/hr at 07/19/18 0400, 1,250 Units/hr at 07/19/18 0400 .  HYDROcodone-acetaminophen (NORCO/VICODIN) 5-325 MG per tablet 1-2 tablet, 1-2 tablet, Oral, Q6H PRN, Annice Needyew, Jason S, MD, 1 tablet at 07/18/18 1801 .  insulin aspart (novoLOG) injection 0-15 Units, 0-15 Units, Subcutaneous, TID WC, Annice Needyew, Jason S, MD, 2 Units at 07/18/18 1250 .  insulin aspart (novoLOG) injection 0-5 Units, 0-5 Units, Subcutaneous, QHS, Annice Needyew, Jason S, MD, 3 Units at 07/16/18 2307 .  insulin aspart (novoLOG) injection 3 Units, 3 Units, Subcutaneous, TID WC, Annice Needyew, Jason S, MD, 3 Units at 07/18/18 1250 .  insulin glargine (LANTUS) injection 10 Units, 10 Units, Subcutaneous, QHS, Annice Needyew, Jason S, MD, 10 Units at 07/18/18 2138 .  ipratropium-albuterol (DUONEB) 0.5-2.5 (3) MG/3ML nebulizer solution 3 mL, 3 mL, Nebulization, Once, Dew, Marlow BaarsJason S, MD .  MEDLINE mouth rinse, 15 mL, Mouth Rinse, BID, Dew, Marlow BaarsJason S, MD, 15 mL at 07/18/18 2140 .  metoprolol tartrate (LOPRESSOR) injection 5 mg, 5 mg, Intravenous, Q6H, Dew, Marlow BaarsJason S, MD, 5 mg at 07/19/18 0548 .  midodrine (PROAMATINE) tablet 10 mg, 10 mg, Oral, TID WC, Dew, Marlow BaarsJason S, MD, 10 mg at 07/18/18 1716 .  mometasone-formoterol (DULERA) 200-5 MCG/ACT inhaler 2 puff, 2 puff, Inhalation, BID, Dew, Marlow BaarsJason S, MD, 2 puff at 07/17/18 2150 .  morphine 4 MG/ML  injection 4 mg, 4 mg, Intravenous, Q4H PRN, Annice Needy, MD, 4 mg at 07/18/18 2147 .  multivitamin-lutein (OCUVITE-LUTEIN) capsule 1 capsule, 1 capsule, Oral, Daily, Dew, Marlow Baars, MD, 1 capsule at 07/13/18 1248 .  nicotine (NICODERM CQ - dosed in mg/24 hours) patch 14 mg, 14 mg, Transdermal, Daily PRN, Wyn Quaker, Marlow Baars, MD .  norepinephrine (LEVOPHED) 4mg  in NS premix infusion, 0-40 mcg/min, Intravenous, Titrated, Judithe Modest, NP, Stopped at 07/18/18 0911 .  ondansetron (ZOFRAN) injection 4 mg, 4 mg, Intravenous, Q6H PRN, Annice Needy, MD, 4 mg at 07/18/18 1034 .  ondansetron (ZOFRAN) injection 4 mg, 4 mg, Intravenous, Q6H PRN, Dew, Marlow Baars, MD .  pantoprazole (PROTONIX) EC tablet 40 mg, 40 mg, Oral, BID, Dew, Marlow Baars, MD, 40 mg at 07/18/18 2147 .  polyethylene glycol (MIRALAX / GLYCOLAX) packet 17 g, 17 g, Oral, Daily PRN, Wyn Quaker, Marlow Baars, MD .  polyethylene glycol (MIRALAX / GLYCOLAX) packet 17 g, 17 g, Oral, Daily, Dew, Marlow Baars, MD, Stopped at 07/12/18 1051 .  prochlorperazine (COMPAZINE) injection 5 mg, 5 mg, Intravenous, Q6H PRN, Dew, Marlow Baars, MD .  protein supplement (ENSURE MAX) liquid, 11 oz, Oral, BID, Dew, Marlow Baars, MD, 11 oz at 07/18/18 2140 .  tamsulosin (FLOMAX) capsule 0.4 mg, 0.4 mg, Oral, Daily, Dew, Marlow Baars, MD, 0.4 mg at 07/17/18 0906 .  tiotropium (SPIRIVA) inhalation capsule ([facility] use ONLY) 18 mcg, 18 mcg, Inhalation, Daily, Annice Needy, MD, 18 mcg at 07/15/18 [phone] .  vancomycin (VANCOCIN) IVPB 750 mg/150 ml premix, 750 mg, Intravenous, Q24H, Hallaji, Mardene Speak, RPH, Stopped at 07/19/18 0042 .  vitamin C (ASCORBIC ACID) tablet 500 mg, 500 mg, Oral, BID, Dew, Marlow Baars, MD, 500 mg at 07/18/18 2138      Indwelling Urinary Catheter continued, requirement due to   Reason to continue Indwelling Urinary Catheter for strict Intake/Output monitoring for hemodynamic instability    cxr bibasilar infilterates with small pl effusions. ? Slight increased infilterates  ASSESSMENT  AND PLAN SYNOPSIS   Bilateral infected Diabetic foot wounds, with right foot osteomyelitis Wound cultures + MRSA Meets SIRS Criteria (with new tachycardia and hypotension) Severe Peripheral Vascular Disease -Monitor fever curve -Trend WBC'sand Procalcitonin -cont Vancomycin. Also on cefepime( for possible pneumonia) -follow cultures -Podiatry and Vascular surgery following, appreciate input -S/p debridement of R foot on 1/10 -S/pangioplasty with stent placement of the right superficial femoral arteryon 1/14 -S/pbilateral femoral enterectomies on 1/16 vascular and podiatry to decide about further surgical options. D/w Podiatrist.  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 Hx: PAD, HLD -Cardiac monitoring -Maintain MAP >65 -Continue Midodrine -Levophed if needed to maintain MAP goal -IVF: NS @ 50 ml/r -Heparin drip -Cardiology following, Cardiology recommends medical management -Echocardiogram pending  AKI, cr has normalized Will stop IVF Cont po lasix for now -Monitor I&O's / urinary output -Follow BMP -Ensure adequate renal perfusion -Avoid nephrotoxic agents as able -Replace electrolytes as indicated  COPD without acute Exacerbation CXR on 1/15 with questionable RUL infiltrate  Hx: Smoker -Supplemental O2 as needed to maintain O2 sats 88 to 94% -Intermittent CXR and ABG as needed,  -followsputum culture -Continue Dulera and Spiriva -Prn Bronchodilators -Incentive Spirometry. Cont cefepime  Diabetes Mellitus with Hyperglycemia -CBG's -SSI and Lantus -Follow ICU Hypo/hyperglycemia protocol   Anemia 2 units of blood transfused H&h is stable for now Monitor for S/Sx of bleeding Trend CBC Transfuse for Hgb <7  Full code On hep drip  April CookeyKhalid [name]

## 2018-07-19 NOTE — Progress Notes (Signed)
[facility] [place] [facility]   PATIENT NAME: April [name]    MR#:  [id number]  DATE OF BIRTH:  1955-10-09  SUBJECTIVE:  CHIEF COMPLAINT:  Patient currently off Levophed.  Currently asymptomatic  REVIEW OF SYSTEMS:  CONSTITUTIONAL: No fever, fatigue or weakness.  EYES: No blurred or double vision.  EARS, NOSE, AND THROAT: No tinnitus or ear pain.  RESPIRATORY: No cough, shortness of breath, wheezing or hemoptysis.  CARDIOVASCULAR: No chest pain, orthopnea, edema.  GASTROINTESTINAL: No nausea, vomiting, diarrhea or abdominal pain.  GENITOURINARY: No dysuria, hematuria.  ENDOCRINE: No polyuria, nocturia,  HEMATOLOGY: No anemia, easy bruising or bleeding SKIN: Right foot in dressing and has left heel ulcer MUSCULOSKELETAL: No joint pain or arthritis.   NEUROLOGIC: No tingling, numbness, weakness.  PSYCHIATRY: No anxiety or depression.   DRUG ALLERGIES:   Allergies  Allergen Reactions  . Eggs Or Egg-Derived Products Diarrhea, Itching and Nausea And Vomiting    VITALS:  Blood pressure (!) 109/59, pulse 88, temperature 98.1 F (36.7 C), temperature source Axillary, resp. rate 19, height 5\' 2"  (1.575 m), weight 48.2 kg, SpO2 97 %.  PHYSICAL EXAMINATION:  GENERAL:  63 y.o.-year-old patient lying in the bed with no acute distress.  EYES: Pupils equal, round, reactive to light and accommodation. No scleral icterus. Extraocular muscles intact.  HEENT: Head atraumatic, normocephalic. Oropharynx and nasopharynx clear.  NECK:  Supple, no jugular venous distention. No thyroid enlargement, no tenderness.  LUNGS: Normal breath sounds bilaterally, no wheezing, rales,rhonchi or crepitation. No use of accessory muscles of respiration.  CARDIOVASCULAR: S1, S2 normal. No murmurs, rubs, or gallops.  ABDOMEN: Soft, nontender, nondistended. Bowel sounds present.  EXTREMITIES: No pedal edema, cyanosis, or clubbing.  NEUROLOGIC: Awake, alert and oriented x3  sensation intact. Gait not checked.  PSYCHIATRIC: The patient is alert and oriented x 3.  SKIN: Right foot with wound VAC left heel ulcer with necrosis  LABORATORY PANEL:   CBC Recent Labs  Lab 07/19/18 0440  WBC 12.6*  HGB 8.9*  HCT 28.9*  PLT 186   ------------------------------------------------------------------------------------------------------------------  Chemistries  Recent Labs  Lab 07/17/18 0526  07/19/18 0104  NA 134*   < > 133*  K 4.7   < > 5.3*  CL 99   < > 107  CO2 25   < > 20*  GLUCOSE 260*   < > 131*  BUN 30*   < > 33*  CREATININE 1.04*   < > 0.86  CALCIUM 8.3*   < > 7.5*  MG 2.1  --   --    < > = values in this interval not displayed.   ------------------------------------------------------------------------------------------------------------------  Cardiac Enzymes Recent Labs  Lab 07/18/18 0210  TROPONINI 0.40*   ------------------------------------------------------------------------------------------------------------------  RADIOLOGY:  Dg Chest 1 View  Result Date: 07/18/2018 CLINICAL DATA:  Status post central line placement EXAM: CHEST  1 VIEW COMPARISON:  07/18/2018 FINDINGS: Cardiac shadow is stable. New right jugular central line is noted with the catheter tip in the distal superior vena cava. No pneumothorax is seen. Patchy infiltrative changes again noted in the right upper lobe. Mild hyperinflation is again noted. IMPRESSION: No pneumothorax following central line placement. Patchy infiltrate in the right upper lobe. Electronically Signed   By: Alcide Clever M.D.   On: 07/18/2018 07:21   Mr Ankle Left Wo Contrast  Result Date: 07/18/2018 CLINICAL DATA:  Calcaneal osteomyelitis.  Follow-up. EXAM: MRI OF THE LEFT ANKLE WITHOUT CONTRAST TECHNIQUE: Multiplanar, multisequence MR imaging of the  ankle was performed. No intravenous contrast was administered. COMPARISON:  Foot radiographs 07/20/2018. Left forefoot MRI 05/24/2018. FINDINGS:  Previous examination included most of the foot, but not the calcaneal tuberosity. TENDONS Peroneal: Intact and normally positioned. Posteromedial: Intact and normally positioned. Trace fluid in the posterior tibialis tendon sheath. Anterior: Intact and normally positioned. Achilles: Intact.  Trace fluid in the retrocalcaneal bursa. Plantar Fascia: Intact. LIGAMENTS Lateral: The anterior and posterior talofibular and calcaneofibular ligaments are intact. Medial: The deltoid and visualized portions of the spring ligament appear intact. CARTILAGE AND BONES Ankle Joint: No significant ankle joint effusion. The talar dome and tibial plafond are intact. Subtalar Joints/Sinus Tarsi: Unremarkable. Bones: There is a large area of soft tissue ulceration posterior to the calcaneal tuberosity. There is underlying prominent marrow T2 hyperintensity in the calcaneal tuberosity. In addition, there is associated decreased T1 signal, but no definite cortical destruction. The additional bones appear unremarkable. Other: As above, large area of soft tissue ulceration posterior to the calcaneal tuberosity without underlying fluid collection. There is diffuse subcutaneous edema surrounding the ankle and extending into the dorsum of the midfoot and forefoot. No focal fluid collections are seen. 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 IMPRESSION: 1. Large area of soft tissue ulceration over the posterior aspect of the heel with underlying marrow T2 hyperintensity and T1 hypointensity in the calcaneal tuberosity consistent with osteomyelitis. This area was not previously imaged. 2. Generalized cellulitis and muscular T2 hyperintensity. No focal fluid collection. 3. No significant ankle tendon or ligament pathology. Electronically Signed   By: Carey BullocksWilliam  Veazey M.D.   On: 07/18/2018 14:51   Dg Chest Port 1 View  Result Date: 07/19/2018 CLINICAL DATA:  Recent pneumonia EXAM:  PORTABLE CHEST 1 VIEW COMPARISON:  January 16, 2019 FINDINGS: Central catheter tip is in superior vena cava. No pneumothorax. There is a small pleural effusion on each side with right base atelectasis. There is no airspace consolidation currently. Heart is upper normal in size with pulmonary vascularity normal. There is aortic atherosclerosis. Bones are osteoporotic. IMPRESSION: Central catheter tip in superior vena cava. No pneumothorax. Paragraphs pleural effusions bilaterally with right base atelectasis. No frank consolidation evident currently. Subtle opacity in the right upper lobe seen 1 day prior is no longer appreciable. Stable cardiac silhouette.  There is aortic atherosclerosis. Aortic Atherosclerosis (ICD10-I70.0). Electronically Signed   By: Bretta BangWilliam  Woodruff III M.D.   On: 07/19/2018 08:30   Dg Chest Port 1 View  Result Date: 07/18/2018 CLINICAL DATA:  Leukocytosis EXAM: PORTABLE CHEST 1 VIEW COMPARISON:  07/16/2018 FINDINGS: Mild central right upper lobe opacity, suspicious for pneumonia. Lungs are otherwise clear. No pleural effusion or pneumothorax. The heart is normal in size. IMPRESSION: Mild right upper lobe opacity, suspicious for pneumonia. Electronically Signed   By: Charline BillsSriyesh  Krishnan M.D.   On: 07/18/2018 03:15   Dg C-arm 1-60 Min-no Report  Result Date: 07/17/2018 Fluoroscopy was utilized by the requesting physician.  No radiographic interpretation.    EKG:   Orders placed or performed during the hospital encounter of 07/09/18  . EKG 12-Lead  . EKG 12-Lead  . EKG 12-Lead  . EKG 12-Lead  . EKG 12-Lead  . EKG 12-Lead    ASSESSMENT AND PLAN:     #1 bilateral infected diabetic foot wounds-  right foot osteomyelitis  Status post evaluation by podiatry As well as vascular surgery Patient with severe peripheral vascular disease status post intervention Continue cefepime  #2  Hypotension off Levophed  #  3 non-ST MI patient not candidate for any type of invasive cardiac  procedure according to cardiology due to anemia and other multiple issues going on Plan to medically manage   #4recurrent left lower extremity DVT-on Xarelto at home. -Continue heparin  #5 COPD-stable, no signs of acute exacerbation -Continue home inhalers  #6 uncontrolled type 2 diabetes- recent A1c 10.9. -Continue Lantus will need adjustment  #7 hyperlipidemia-stable -Continue home statin  #8 anxiety-stable -Continue home Xanax  #9 severe protein-calorie malnutrition -Continue home Premier protein twice daily between meals  #10 head lice-that is post treatment permethrin  #11 tobacco abuse -Nicotine patch as needed  All the records are reviewed and case discussed with Care Management/Social Workerr. Management plans discussed with the patient, daughter at bedside and they are in agreement  CODE STATUS: fc  TOTAL TIME TAKING CARE OF THIS PATIENT: 33 minutes.   POSSIBLE D/C IN 2 DAYS, DEPENDING ON CLINICAL CONDITION.  Note: This dictation was prepared with Dragon dictation along with smaller phrase technology. Any transcriptional errors that result from this process are unintentional.   Auburn Bilberry M.D on 07/19/2018 at 1:47 PM  Between 7am to 6pm - Pager - [phone] After 6pm go to [email or web address] - password [email or web address] [facility]  [facility]  Office  [phone]  CC: Primary care physician; Mickel Fuchs, MD

## 2018-07-19 NOTE — Progress Notes (Signed)
[facility] Encounter Note  Patient: April [name] / Admit Date: 07/09/2018 / Date of Encounter: 07/19/2018, 6:31 AM   Subjective: Patient weak fatigued and unable to converse this morning.  Continues to slowly recover from diabetic foot wound and severe peripheral vascular disease surgical intervention.  No current evidence of angina or congestive heart failure.  Patient had a peak troponin of 1.1 from 2 days prior consistent with non-ST elevation myocardial infarction possibly due to current medical conditions.  This includes the possibility of hypoxia tachycardia and severe anemia.  Patient has tolerated additional packed red blood cells with improvements of anemia  Review of Systems: Positive for: Weakness fatigue Negative for: Vision change, hearing change, syncope, dizziness, nausea, vomiting,diarrhea, bloody stool, stomach pain, cough, congestion, diaphoresis, urinary frequency, urinary pain,skin lesions, skin rashes Others previously listed  Objective: Telemetry: Sinus tachycardia Physical Exam: Blood pressure 118/81, pulse 85, temperature 100 F (37.8 C), temperature source Axillary, resp. rate 18, height 5\' 2"  (1.575 m), weight 48.2 kg, SpO2 97 %. Body mass index is 19.44 kg/m. General: Well developed, well nourished, in no acute distress. Head: Normocephalic, atraumatic, sclera non-icteric, no xanthomas, nares are without discharge. Neck: No apparent masses Lungs: Normal respirations with diffuse wheezes, some rhonchi, no rales , no crackles   Heart: Regular rate and rhythm, normal S1 S2, no murmur, no rub, no gallop, PMI is normal size and placement, carotid upstroke normal without bruit, jugular venous pressure normal Abdomen: Soft, non-tender, non-distended with normoactive bowel sounds. No hepatosplenomegaly. Abdominal aorta is normal size without bruit Extremities: N trace edema, no clubbing, no cyanosis, positive ulcers,  Peripheral: 2+ radial, 1 +  femoral, 0 + dorsal pedal pulses Neuro: Is not alert and oriented. Moves all extremities spontaneously. Psych: Does not responds to questions appropriately with a normal affect.   Intake/Output Summary (Last 24 hours) at 07/19/2018 0631 Last data filed at 07/19/2018 0400 Gross per 24 hour  Intake 2780.03 ml  Output 700 ml  Net 2080.03 ml    Inpatient Medications:  . sodium chloride   Intravenous Once  . sodium chloride   Intravenous Once  . aspirin EC  81 mg Oral Daily  . atorvastatin  40 mg Oral q1800  . ferrous sulfate  325 mg Oral BID WC  . furosemide  40 mg Oral Daily  . insulin aspart  0-15 Units Subcutaneous TID WC  . insulin aspart  0-5 Units Subcutaneous QHS  . insulin aspart  3 Units Subcutaneous TID WC  . insulin glargine  10 Units Subcutaneous QHS  . ipratropium-albuterol  3 mL Nebulization Once  . mouth rinse  15 mL Mouth Rinse BID  . metoprolol tartrate  5 mg Intravenous Q6H  . midodrine  10 mg Oral TID WC  . mometasone-formoterol  2 puff Inhalation BID  . multivitamin-lutein  1 capsule Oral Daily  . pantoprazole  40 mg Oral BID  . polyethylene glycol  17 g Oral Daily  . ENSURE MAX PROTEIN  11 oz Oral BID  . tamsulosin  0.4 mg Oral Daily  . tiotropium  18 mcg Inhalation Daily  . ascorbic acid  500 mg Oral BID   Infusions:  . sodium chloride 5 mL/hr at 07/17/18 2330  . sodium chloride 100 mL/hr at 07/19/18 0400  . ceFEPime (MAXIPIME) IV Stopped (07/18/18 2207)  . heparin 1,250 Units/hr (07/19/18 0400)  . norepinephrine (LEVOPHED) Adult infusion Stopped (07/18/18 0911)  . vancomycin Stopped (07/19/18 0042)    Labs: Recent Labs  07/16/18 1737 07/17/18 0526 07/18/18 0210 07/19/18 0104  NA 135 134* 133* 133*  K 4.9 4.7 4.7 5.3*  CL 101 99 103 107  CO2 26 25 24  20*  GLUCOSE 152* 260* 234* 131*  BUN 23 30* 28* 33*  CREATININE 0.76 1.04* 0.87 0.86  CALCIUM 8.1* 8.3* 7.9* 7.5*  MG 1.9 2.1  --   --    No results for input(s): AST, ALT, ALKPHOS,  BILITOT, PROT, ALBUMIN in the last 72 hours. Recent Labs    07/16/18 1737  07/18/18 0210 07/18/18 1450 07/19/18 0440  WBC 13.0*   < > 11.3*  --  12.6*  NEUTROABS 10.6*  --   --   --  9.5*  HGB 7.4*   < > 6.5* 9.5* 8.9*  HCT 24.7*   < > 21.8*  --  28.9*  MCV 95.4   < > 96.0  --  94.8  PLT 161   < > 172  --  186   < > = values in this interval not displayed.   Recent Labs    07/16/18 1439 07/16/18 1737 07/17/18 0215 07/18/18 0210  TROPONINI <0.03 0.11* 1.19* 0.40*   Invalid input(s): POCBNP No results for input(s): HGBA1C in the last 72 hours.   Weights: Filed Weights   07/15/18 1241 07/16/18 0746 07/19/18 0446  Weight: 47.8 kg 47.8 kg 48.2 kg     Radiology/Studies:  Dg Chest 1 View  Result Date: 07/18/2018 CLINICAL DATA:  Status post central line placement EXAM: CHEST  1 VIEW COMPARISON:  07/18/2018 FINDINGS: Cardiac shadow is stable. New right jugular central line is noted with the catheter tip in the distal superior vena cava. No pneumothorax is seen. Patchy infiltrative changes again noted in the right upper lobe. Mild hyperinflation is again noted. IMPRESSION: No pneumothorax following central line placement. Patchy infiltrate in the right upper lobe. Electronically Signed   By: Alcide CleverMark  Lukens M.D.   On: 07/18/2018 07:21   Mr Ankle Left Wo Contrast  Result Date: 07/18/2018 CLINICAL DATA:  Calcaneal osteomyelitis.  Follow-up. EXAM: MRI OF THE LEFT ANKLE WITHOUT CONTRAST TECHNIQUE: Multiplanar, multisequence MR imaging of the ankle was performed. No intravenous contrast was administered. COMPARISON:  Foot radiographs 07/20/2018. Left forefoot MRI 05/24/2018. FINDINGS: Previous examination included most of the foot, but not the calcaneal tuberosity. TENDONS Peroneal: Intact and normally positioned. Posteromedial: Intact and normally positioned. Trace fluid in the posterior tibialis tendon sheath. Anterior: Intact and normally positioned. Achilles: Intact.  Trace fluid in the  retrocalcaneal bursa. Plantar Fascia: Intact. LIGAMENTS Lateral: The anterior and posterior talofibular and calcaneofibular ligaments are intact. Medial: The deltoid and visualized portions of the spring ligament appear intact. CARTILAGE AND BONES Ankle Joint: No significant ankle joint effusion. The talar dome and tibial plafond are intact. Subtalar Joints/Sinus Tarsi: Unremarkable. Bones: There is a large area of soft tissue ulceration posterior to the calcaneal tuberosity. There is underlying prominent marrow T2 hyperintensity in the calcaneal tuberosity. In addition, there is associated decreased T1 signal, but no definite cortical destruction. The additional bones appear unremarkable. Other: As above, large area of soft tissue ulceration posterior to the calcaneal tuberosity without underlying fluid collection. There is diffuse subcutaneous edema surrounding the ankle and extending into the dorsum of the midfoot and forefoot. No focal fluid collections are seen. 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 IMPRESSION: 1. Large area of soft tissue ulceration over the posterior aspect of the heel with underlying marrow T2  hyperintensity and T1 hypointensity in the calcaneal tuberosity consistent with osteomyelitis. This area was not previously imaged. 2. Generalized cellulitis and muscular T2 hyperintensity. No focal fluid collection. 3. No significant ankle tendon or ligament pathology. Electronically Signed   By: Carey BullocksWilliam  Veazey M.D.   On: 07/18/2018 14:51   Dg Chest Port 1 View  Result Date: 07/18/2018 CLINICAL DATA:  Leukocytosis EXAM: PORTABLE CHEST 1 VIEW COMPARISON:  07/16/2018 FINDINGS: Mild central right upper lobe opacity, suspicious for pneumonia. Lungs are otherwise clear. No pleural effusion or pneumothorax. The heart is normal in size. IMPRESSION: Mild right upper lobe opacity, suspicious for pneumonia. Electronically Signed   By: Charline BillsSriyesh   Krishnan M.D.   On: 07/18/2018 03:15   Dg Chest Port 1 View  Result Date: 07/16/2018 CLINICAL DATA:  Shortness of breath, coughing up yellow sputum, history COPD, diabetes mellitus, smoker EXAM: PORTABLE CHEST 1 VIEW COMPARISON:  Portable exam 1416 hours compared to 06/11/2018 FINDINGS: Normal heart size and mediastinal contours. Question mild pulmonary vascular congestion. Atherosclerotic calcification aorta. Diffuse interstitial prominence and scattered Kerley B-lines at the mid to lower lungs question pulmonary edema. Decreased bibasilar effusions and atelectasis versus previous exam. Question subtle developing infiltrate RIGHT upper lobe. No pneumothorax. Bones demineralized. IMPRESSION: Question subtle RIGHT upper lobe infiltrate. Improved bibasilar pleural effusions and atelectasis. Persistent interstitial prominence in mid to lower lungs suggesting minimal pulmonary edema. Electronically Signed   By: Ulyses SouthwardMark  Boles M.D.   On: 07/16/2018 14:39   Dg Foot Complete Right  Result Date: 07/10/2018 CLINICAL DATA:  Foot ulcer. EXAM: RIGHT FOOT COMPLETE - 3+ VIEW COMPARISON:  06/03/2018 FINDINGS: Stable postsurgical resection of the distal first metatarsal bone. Again seen is irregularity of the base of the first proximal phalanx possibly due to ongoing osteomyelitis. 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 Cortical irregularity of the remaining metatarsal bone is stable from November 2019. There is a deep penetrating wound in the plantar foot at the level of the first metatarsophalangeal joint. IMPRESSION: Probable surgical resection of the distal first metatarsal bone. Deep penetrating wound at the level of the first metatarsophalangeal joint, with cortical irregularity of the base of the first proximal phalanx, suggestive possible osteomyelitis. Electronically Signed   By: Ted Mcalpineobrinka  Dimitrova M.D.   On: 07/10/2018 14:27   Dg C-arm 1-60 Min-no Report  Result Date: 07/17/2018 Fluoroscopy was  utilized by the requesting physician.  No radiographic interpretation.     Assessment and Recommendation  63 y.o. female with known severe peripheral vascular disease with diabetic foot wound essential hypertension mixed hyperlipidemia status post surgical intervention with some improvements and a non-ST elevation myocardial infarction without current evidence of angina or congestive heart failure slowly recovering 1.  Continue supportive care of peripheral vascular disease and diabetic foot ulcers without restriction 2.  No further cardiac diagnostics necessary at this time 3.  Continue high intensity cholesterol therapy for cardiovascular risk reduction 4.  Furosemide for any pulmonary edema and lower extremity edema 5.  Metoprolol for hypertension control and risk reduction cardiovascular event 6.  Continue rehabilitation today  Signed, Arnoldo HookerBruce Shela Esses M.D. FACC

## 2018-07-19 NOTE — Progress Notes (Signed)
Pharmacy Antibiotic Note  April [name] is a 63 y.o. female admitted on 07/09/2018 with Wound infection.  Pharmacy has been consulted for vancomycin dosing. During an admission here in November she received vancomycin for the same indication and her regimen was 1 gram every 24 hours resulting in a Vt of 12 mcg/mL. Vancomycin dose was started at a dose of 1000 mg every 24 hours (1st dose was 750mg ). A vancomycin peak level was drawn at steady-state and a random level the next morning were used to create a pharmacokinetic profile. AUC of 580 was calculated.   Plan: 1/17 Scr increased. Vancomycin dose changed to 750mg  IV every 24 hours. Goal AUC 400-550. Expected AUC: 470 SCr used: 0.86 Will continue to monitor renal function and adjust dose as needed. Will order post distribution vanc level and vanc trough 1/19-1/20  -Continue patient on cefepime 1g IV q12h per CrCl 30-60 ml/min  1/18 Scr 0.87 >>0.86. Will continue current Vancomycin dose and check AUC/levels/Scr tomorrow.    Height: 5\' 2"  (157.5 cm) Weight: 106 lb 4.2 oz (48.2 kg) IBW/kg (Calculated) : 50.1  Temp (24hrs), Avg:98.9 F (37.2 C), Min:98.3 F (36.8 C), Max:100 F (37.8 C)  Recent Labs  Lab 07/13/18 2056 07/14/18 0506 07/14/18 1432 07/14/18 1656  07/16/18 0331 07/16/18 1737 07/17/18 0526 07/18/18 0210 07/19/18 0104 07/19/18 0440  WBC  --  8.2  --   --   --   --  13.0* 11.1* 11.3*  --  12.6*  CREATININE  --   --  0.82  --    < > 0.66 0.76 1.04* 0.87 0.86  --   VANCOTROUGH  --   --   --  17  --   --   --   --   --   --   --   VANCOPEAK 32  --   --   --   --   --   --   --   --   --   --   VANCORANDOM  --   --  17  --   --   --   --   --   --   --   --    < > = values in this interval not displayed.    Estimated Creatinine Clearance: 50.9 mL/min (by C-G formula based on SCr of 0.86 mg/dL).    Antimicrobials this admission: 1/8 vancomcyin >>  1/8 pip/tazo >> 1/12 1/17 cefepime>>   Dose adjustments this  admission: 1/9 Vancomycin 750mg  every 36 hours-----> 1000mg  q24h 1/13 vancomycin 1000mg  q24h----> 1250mg  q24h (dose not administered) 1/14 vancomycin 1250mg  q24h----> 1000mg  q24h 1/17 Vancomycin 1000 mg q24h ----> 750mg  q24h  Microbiology results: 1/8 Heel Cx: MRSA  Thank you for allowing pharmacy to be a part of this patient's care.  Angelique Blonder, PharmD Clinical Pharmacist 07/19/2018 8:16 AM

## 2018-07-19 NOTE — Progress Notes (Signed)
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  Allergies  Allergen Reactions  . Eggs Or Egg-Derived Products Diarrhea, Itching and Nausea And Vomiting    Patient Measurements: Height: 5\' 2"  (157.5 cm) Weight: 106 lb 4.2 oz (48.2 kg) IBW/kg (Calculated) : 50.1 Heparin Dosing Weight: 36.6 kg - reported by Jacqulyn Cane RN  Vital Signs: Temp: 100 F (37.8 C) (01/18 0000) Temp Source: Axillary (01/18 0000) BP: 118/81 (01/18 0400) Pulse Rate: 85 (01/18 0400)  Labs: Recent Labs    07/16/18 1737 07/17/18 0215 07/17/18 0526  07/18/18 0210 07/18/18 1450 07/18/18 1751 07/19/18 0104 07/19/18 0440 07/19/18 0709  HGB 7.4*  --  7.7*  --  6.5* 9.5*  --   --  8.9*  --   HCT 24.7*  --  26.4*  --  21.8*  --   --   --  28.9*  --   PLT 161  --  185  --  172  --   --   --  186  --   APTT  --   --  73*  --   --   --   --   --   --   --   LABPROT  --   --  14.8  --   --   --   --   --   --   --   INR  --   --  1.17  --   --   --   --   --   --   --   HEPARINUNFRC  --   --  0.21*   < > 0.41  --  0.23* 0.44  --  0.67  CREATININE 0.76  --  1.04*  --  0.87  --   --  0.86  --   --   TROPONINI 0.11* 1.19*  --   --  0.40*  --   --   --   --   --    < > = values in this interval not displayed.    Estimated Creatinine Clearance: 50.9 mL/min (by C-G formula based on SCr of 0.86 mg/dL).   Assessment: Pharmacy was consulted for heparin.  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 Will follow aPTT.   Goal of Therapy:  APTT level 66-102 seconds or Heparin level 0.3-0.7 units/ml once heparin level correlates with aPTT  Monitor platelets by anticoagulation protocol: Yes  Heparin Course: 1/13 AM heparin level 0.24. 600 unit  bolus and increase rate t o 850 units/hr. Recheck in 6 hours.  1/13:  HL @ 2055 = 0.17 .   Heparin was not held during  any point on 1/13, angiogram was not done.   Will order heparin 1750 units IV X 1 once and increase drip rate to 1050 units/hr.  Will recheck HL 6 hrs after rate change.  1/14 Patient had angiogram with heparin on hold. 01/17 @ 0200 HL 0.41 therapeutic. Will continue current rate and will recheck HL @ 0800, Hgb now below 7 @ 6.5 will continue to monitor.  01/17 @ 0500 patient's heparin drip to be held for central line placement will retime HL for 1200. 01/18 @ 0100 HL 0.44 therapeutic. Will continue current rate and will recheck HL @ 0700. Patient is s/p 2 transfusions w/ a hgb of 9.5 mg/dL will continue to monitor.  Plan:  1/18 0709 HL= 0.67.  Hgb 8.9 Plt 186. Will continue current rateof 1250 units/hr. Will f/u next HL with am labs  Angelique Blonder, PharmD Clinical Pharmacist 07/19/2018,8:07 AM

## 2018-07-19 NOTE — Progress Notes (Signed)
2 Days Post-Op   Subjective/Chief Complaint: Pain better controlled. Remained HD stable overnight. Still not communicative, however, in no distress.   Objective: Vital signs in last 24 hours: Temp:  [98.3 F (36.8 C)-100 F (37.8 C)] 100 F (37.8 C) (01/18 0000) Pulse Rate:  [84-113] 85 (01/18 0400) Resp:  [18-31] 18 (01/18 0400) BP: (105-128)/(65-91) 118/81 (01/18 0400) SpO2:  [91 %-99 %] 97 % (01/18 0400) Arterial Line BP: (99-128)/(51-61) 119/53 (01/17 1300) Weight:  [48.2 kg] 48.2 kg (01/18 0446) Last BM Date: 07/14/18  Intake/Output from previous day: 01/17 0701 - 01/18 0700 In: 2780 [I.V.:1865.1; Blood:665; IV Piggyback:250] Out: 700 [Urine:700] Intake/Output this shift: No intake/output data recorded.  General appearance: alert and no distress Resp: clear to auscultation bilaterally Cardio: regular rate and rhythm, S1, S2 normal, no murmur, click, rub or gallop Extremities: RIGHT: +PT; LEFT: +DP, groin incisions- C/D/I, Right heel dressing intact-VAC  Lab Results:  Recent Labs    07/18/18 0210 07/18/18 1450 07/19/18 0440  WBC 11.3*  --  12.6*  HGB 6.5* 9.5* 8.9*  HCT 21.8*  --  28.9*  PLT 172  --  186   BMET Recent Labs    07/18/18 0210 07/19/18 0104  NA 133* 133*  K 4.7 5.3*  CL 103 107  CO2 24 20*  GLUCOSE 234* 131*  BUN 28* 33*  CREATININE 0.87 0.86  CALCIUM 7.9* 7.5*   PT/INR Recent Labs    07/17/18 0526  LABPROT 14.8  INR 1.17   ABG No results for input(s): PHART, HCO3 in the last 72 hours.  Invalid input(s): PCO2, PO2  Studies/Results: Dg Chest 1 View  Result Date: 07/18/2018 CLINICAL DATA:  Status post central line placement EXAM: CHEST  1 VIEW COMPARISON:  07/18/2018 FINDINGS: Cardiac shadow is stable. New right jugular central line is noted with the catheter tip in the distal superior vena cava. No pneumothorax is seen. Patchy infiltrative changes again noted in the right upper lobe. Mild hyperinflation is again noted.  IMPRESSION: No pneumothorax following central line placement. Patchy infiltrate in the right upper lobe. Electronically Signed   By: Alcide CleverMark  Lukens M.D.   On: 07/18/2018 07:21   Mr Ankle Left Wo Contrast  Result Date: 07/18/2018 CLINICAL DATA:  Calcaneal osteomyelitis.  Follow-up. EXAM: MRI OF THE LEFT ANKLE WITHOUT CONTRAST TECHNIQUE: Multiplanar, multisequence MR imaging of the ankle was performed. No intravenous contrast was administered. COMPARISON:  Foot radiographs 07/20/2018. Left forefoot MRI 05/24/2018. FINDINGS: Previous examination included most of the foot, but not the calcaneal tuberosity. TENDONS Peroneal: Intact and normally positioned. Posteromedial: Intact and normally positioned. Trace fluid in the posterior tibialis tendon sheath. Anterior: Intact and normally positioned. Achilles: Intact.  Trace fluid in the retrocalcaneal bursa. Plantar Fascia: Intact. LIGAMENTS Lateral: The anterior and posterior talofibular and calcaneofibular ligaments are intact. Medial: The deltoid and visualized portions of the spring ligament appear intact. CARTILAGE AND BONES Ankle Joint: No significant ankle joint effusion. The talar dome and tibial plafond are intact. Subtalar Joints/Sinus Tarsi: Unremarkable. Bones: There is a large area of soft tissue ulceration posterior to the calcaneal tuberosity. There is underlying prominent marrow T2 hyperintensity in the calcaneal tuberosity. In addition, there is associated decreased T1 signal, but no definite cortical destruction. The additional bones appear unremarkable. Other: As above, large area of soft tissue ulceration posterior to the calcaneal tuberosity without underlying fluid collection. There is diffuse subcutaneous edema surrounding the ankle and extending into the dorsum of the midfoot and forefoot. No focal fluid collections  are seen. 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 IMPRESSION: 1. Large  area of soft tissue ulceration over the posterior aspect of the heel with underlying marrow T2 hyperintensity and T1 hypointensity in the calcaneal tuberosity consistent with osteomyelitis. This area was not previously imaged. 2. Generalized cellulitis and muscular T2 hyperintensity. No focal fluid collection. 3. No significant ankle tendon or ligament pathology. Electronically Signed   By: Carey Bullocks M.D.   On: 07/18/2018 14:51   Dg Chest Port 1 View  Result Date: 07/19/2018 CLINICAL DATA:  Recent pneumonia EXAM: PORTABLE CHEST 1 VIEW COMPARISON:  January 16, 2019 FINDINGS: Central catheter tip is in superior vena cava. No pneumothorax. There is a small pleural effusion on each side with right base atelectasis. There is no airspace consolidation currently. Heart is upper normal in size with pulmonary vascularity normal. There is aortic atherosclerosis. Bones are osteoporotic. IMPRESSION: Central catheter tip in superior vena cava. No pneumothorax. Paragraphs pleural effusions bilaterally with right base atelectasis. No frank consolidation evident currently. Subtle opacity in the right upper lobe seen 1 day prior is no longer appreciable. Stable cardiac silhouette.  There is aortic atherosclerosis. Aortic Atherosclerosis (ICD10-I70.0). Electronically Signed   By: Bretta Bang III M.D.   On: 07/19/2018 08:30   Dg Chest Port 1 View  Result Date: 07/18/2018 CLINICAL DATA:  Leukocytosis EXAM: PORTABLE CHEST 1 VIEW COMPARISON:  07/16/2018 FINDINGS: Mild central right upper lobe opacity, suspicious for pneumonia. Lungs are otherwise clear. No pleural effusion or pneumothorax. The heart is normal in size. IMPRESSION: Mild right upper lobe opacity, suspicious for pneumonia. Electronically Signed   By: Charline Bills M.D.   On: 07/18/2018 03:15   Dg C-arm 1-60 Min-no Report  Result Date: 07/17/2018 Fluoroscopy was utilized by the requesting physician.  No radiographic interpretation.     Anti-infectives: Anti-infectives (From admission, onward)   Start     Dose/Rate Route Frequency Ordered Stop   07/18/18 2200  vancomycin (VANCOCIN) IVPB 750 mg/150 ml premix     750 mg 150 mL/hr over 60 Minutes Intravenous Every 24 hours 07/18/18 1141     07/18/18 0400  ceFEPIme (MAXIPIME) 1 g in sodium chloride 0.9 % 100 mL IVPB     1 g 200 mL/hr over 30 Minutes Intravenous Every 12 hours 07/18/18 0358     07/17/18 0500  ceFAZolin (ANCEF) IVPB 2g/100 mL premix    Note to Pharmacy:  Send with pt to OR   2 g 200 mL/hr over 30 Minutes Intravenous On call 07/16/18 1214 07/17/18 1502   07/15/18 1800  vancomycin (VANCOCIN) IVPB 1000 mg/200 mL premix  Status:  Discontinued     1,000 mg 200 mL/hr over 60 Minutes Intravenous Every 24 hours 07/15/18 1232 07/18/18 1141   07/15/18 0500  ceFAZolin (ANCEF) IVPB 2g/100 mL premix    Note to Pharmacy:  Send with patient to specials   2 g 200 mL/hr over 30 Minutes Intravenous On call 07/14/18 1704 07/15/18 1425   07/14/18 2230  ceFAZolin (ANCEF) IVPB 2g/100 mL premix     2 g 200 mL/hr over 30 Minutes Intravenous  Once 07/14/18 2222 07/16/18 0206   07/14/18 1800  vancomycin (VANCOCIN) 1,250 mg in sodium chloride 0.9 % 250 mL IVPB  Status:  Discontinued     1,250 mg 166.7 mL/hr over 90 Minutes Intravenous Every 24 hours 07/14/18 1517 07/15/18 1232   07/11/18 1800  vancomycin (VANCOCIN) IVPB 1000 mg/200 mL premix  Status:  Discontinued     1,000 mg 200 mL/hr over 60 Minutes Intravenous Every 24 hours 07/11/18 1631 07/14/18 1517   07/11/18 1645  piperacillin-tazobactam (ZOSYN) IVPB 3.375 g  Status:  Discontinued     3.375 g 12.5 mL/hr over 240 Minutes Intravenous Every 8 hours 07/11/18 1631 07/13/18 1847   07/10/18 1600  vancomycin (VANCOCIN) IVPB 1000 mg/200 mL premix  Status:  Discontinued     1,000 mg 200 mL/hr over 60 Minutes Intravenous Every 24 hours 07/10/18 1507 07/11/18 1518   07/09/18 2000  piperacillin-tazobactam (ZOSYN) IVPB 3.375 g   Status:  Discontinued     3.375 g 12.5 mL/hr over 240 Minutes Intravenous Every 8 hours 07/09/18 1911 07/11/18 1518   07/09/18 2000  vancomycin (VANCOCIN) IVPB 750 mg/150 ml premix  Status:  Discontinued     750 mg 150 mL/hr over 60 Minutes Intravenous Every 36 hours 07/09/18 1911 07/10/18 1507      Assessment/Plan: s/p Procedure(s): ENDARTERECTOMY FEMORAL - BILATERAL SFA STENT PLACEMENT - LEFT (Bilateral) Continue Heparin gtt for now; may require intervention for RIGHT Heel next week. Continue supportive care. Appreciate consultant input and care.  LOS: 10 days    Bertram Denver 07/19/2018

## 2018-07-19 NOTE — Evaluation (Signed)
Physical Therapy Evaluation Patient Details Name: April [name] MRN: [id number] DOB: Oct 18, 1955 Today's Date: 07/19/2018   History of Present Illness  Pt is a 63 y.o. female presenting to hospital 07/09/18 (direct podiatry office admit).  Pt with R foot infection and L heel ulceration (possible osteomyelitis R foot).  S/p excisional debridement R foot with wound VAC placement 07/11/18.  S/p angio 07/15/18.  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  Clinical Impression  Pt very lethargic and reporting high pain level.  She is inconsistent with conversation due to lethargy and being HOH.  Pt stated that she cannot roll or get to EOB at this time due to pain.  PT introduced supine there ex to pt and offered assistance to sit up, which pt again refused.  She was able to perform some there ex with VC's and manual assist for form, reporting pain increase with each.  She presented with fair to poor strength of UE's/LE's.  Pt able to follow commands fairly consistently but reported pain limiting movement in all directions.  Pt will continue to benefit from skilled PT with focus on strength, tolerance to activity, pain management and safe functional mobility.  Pt may benefit from SNF placement following discharge at this time for wound care management and decrease in caregiver burden.  Follow Up Recommendations SNF    Equipment Recommendations  Wheelchair (measurements PT);Wheelchair cushion (measurements PT);3in1 (PT)(pt reports having both at home already; may benefit from [employer] lift)    Recommendations for Other Services       Precautions / Restrictions Restrictions Weight Bearing Restrictions: Yes RLE Weight Bearing: Non weight bearing LLE Weight Bearing: Non weight bearing      Mobility  Bed Mobility Overal bed mobility: (Pt refused mobility stating that she was in too much pain; also very lethargic and fading in and out of consiousness.')                 Transfers                    Ambulation/Gait                Stairs            Wheelchair Mobility    Modified Rankin (Stroke Patients Only)       Balance Overall balance assessment: (Deferred)                                           Pertinent Vitals/Pain Pain Score: 6  Pain Location: B LE's    Home Living Family/patient expects to be discharged to:: Private residence Living Arrangements: Non-relatives/Friends(Friend) Available Help at Discharge: Friend(s);Family;Available 24 hours/day;Home health Type of Home: Apartment Home Access: Stairs to enter Entrance Stairs-Rails: Right;Left;Can reach both Entrance Stairs-Number of Steps: 4 Home Layout: One level Home Equipment: Walker - 2 wheels;Cane - single point;Bedside commode;Wheelchair - manual      Prior Function Level of Independence: Needs assistance   Gait / Transfers Assistance Needed: Pt reports that her friend (that she lives with) and granddaughter have been carrying her to transfer to different surfaces (d/t B foot wounds)  ADL's / Homemaking Assistance Needed: Assist for bathing, dressing, meals, ADL's in general.        Hand Dominance        Extremity/Trunk Assessment   Upper Extremity Assessment Upper Extremity  Assessment: Generalized weakness    Lower Extremity Assessment RLE Deficits / Details: at least 3/5 hip flexion and knee flexion/extension in bed hip abduction: 2-/5; deferred ankle/foot assessment d/t pt reports of significant foot pain with any movement RLE: Unable to fully assess due to pain LLE Deficits / Details: at least 3/5 hip flexion and knee flexion/extension in bed, hip abduction 2-/5; L knee just short of neutral knee extension; deferred ankle/foot assessment d/t pt reports of significant foot pain with any movement LLE: Unable to fully assess due to pain    Cervical / Trunk Assessment Cervical / Trunk Assessment: Kyphotic   Communication   Communication: HOH  Cognition Arousal/Alertness: Lethargic(Pt appearing tired (pt reports not sleeping well d/t B foot pain last night)) Behavior During Therapy: WFL for tasks assessed/performed Overall Cognitive Status: Within Functional Limits for tasks assessed                                        General Comments General comments (skin integrity, edema, etc.): R foot wound vac and B foot dressings intact; dressings noted B groin region (no drainage noted beginning and end of session)    Exercises Total Joint Exercises Quad Sets: Strengthening;Both;5 reps;Supine Gluteal Sets: Strengthening;10 reps;Supine Short Arc Quad: AAROM;Strengthening;AROM;Both;5 reps;Supine Hip ABduction/ADduction: AAROM;Both;Supine;10 reps   Assessment/Plan    PT Assessment Patient needs continued PT services  PT Problem List Decreased strength;Decreased range of motion;Decreased activity tolerance;Decreased balance;Decreased mobility;Decreased knowledge of use of DME;Decreased knowledge of precautions;Pain;Decreased skin integrity       PT Treatment Interventions DME instruction;Functional mobility training;Therapeutic activities;Therapeutic exercise;Balance training;Patient/family education    PT Goals (Current goals can be found in the Care Plan section)  Acute Rehab PT Goals Patient Stated Goal: to have less foot pain PT Goal Formulation: With patient Time For Goal Achievement: 07/30/18 Potential to Achieve Goals: Fair    Frequency Min 2X/week   Barriers to discharge        Co-evaluation               AM-PAC PT "6 Clicks" Mobility  Outcome Measure Help needed turning from your back to your side while in a flat bed without using bedrails?: A Lot Help needed moving from lying on your back to sitting on the side of a flat bed without using bedrails?: A Lot Help needed moving to and from a bed to a chair (including a wheelchair)?: Total Help needed  standing up from a chair using your arms (e.g., wheelchair or bedside chair)?: Total Help needed to walk in hospital room?: Total Help needed climbing 3-5 steps with a railing? : Total 6 Click Score: 8    End of Session   Activity Tolerance: Patient limited by fatigue;Patient limited by pain Patient left: in bed;with call bell/phone within reach;with bed alarm set;with nursing/sitter in room Nurse Communication: Mobility status PT Visit Diagnosis: Other abnormalities of gait and mobility (R26.89);Muscle weakness (generalized) (M62.81);Pain Pain - Right/Left: Right(Left) Pain - part of body: Ankle and joints of foot    Time: 3875-6433 PT Time Calculation (min) (ACUTE ONLY): 20 min   Charges:   PT Evaluation $PT Eval Moderate Complexity: 1 Mod PT Treatments $Therapeutic Exercise: 8-22 mins       Glenetta Hew, PT, DPT   Glenetta Hew 07/19/2018, 10:25 AM

## 2018-07-19 NOTE — Progress Notes (Signed)
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  Allergies  Allergen Reactions  . Eggs Or Egg-Derived Products Diarrhea, Itching and Nausea And Vomiting    Patient Measurements: Height: 5\' 2"  (157.5 cm) Weight: 105 lb 6.1 oz (47.8 kg) IBW/kg (Calculated) : 50.1 Heparin Dosing Weight: 36.6 kg - reported by Jacqulyn Cane RN  Vital Signs: Temp: 100 F (37.8 C) (01/18 0000) Temp Source: Axillary (01/18 0000) BP: 111/75 (01/18 0000) Pulse Rate: 84 (01/18 0000)  Labs: Recent Labs    07/16/18 1737 07/17/18 0215 07/17/18 0526  07/18/18 0210 07/18/18 1450 07/18/18 1751 07/19/18 0104  HGB 7.4*  --  7.7*  --  6.5* 9.5*  --   --   HCT 24.7*  --  26.4*  --  21.8*  --   --   --   PLT 161  --  185  --  172  --   --   --   APTT  --   --  73*  --   --   --   --   --   LABPROT  --   --  14.8  --   --   --   --   --   INR  --   --  1.17  --   --   --   --   --   HEPARINUNFRC  --   --  0.21*   < > 0.41  --  0.23* 0.44  CREATININE 0.76  --  1.04*  --  0.87  --   --  0.86  TROPONINI 0.11* 1.19*  --   --  0.40*  --   --   --    < > = values in this interval not displayed.    Estimated Creatinine Clearance: 50.5 mL/min (by C-G formula based on SCr of 0.86 mg/dL).   Assessment: Pharmacy was consulted for heparin.  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 Will follow aPTT.   Goal of Therapy:  APTT level 66-102 seconds or Heparin level 0.3-0.7 units/ml once heparin level correlates with aPTT  Monitor platelets by anticoagulation protocol: Yes  Heparin Course: 1/13 AM heparin level 0.24. 600 unit  bolus and increase rate t o 850 units/hr. Recheck in 6 hours.  1/13:  HL @ 2055 = 0.17 .   Heparin was not held during any point on 1/13, angiogram was not done.   Will order heparin 1750 units IV X 1 once and increase drip rate to 1050  units/hr.  Will recheck HL 6 hrs after rate change.  1/14 Patient had angiogram with heparin on hold. 01/17 @ 0200 HL 0.41 therapeutic. Will continue current rate and will recheck HL @ 0800, Hgb now below 7 @ 6.5 will continue to monitor.  01/17 @ 0500 patient's heparin drip to be held for central line placement will retime HL for 1200.  Plan:  01/18 @ 0100 HL 0.44 therapeutic. Will continue current rate and will recheck HL @ 0700. Patient is s/p 2 transfusions w/ a hgb of 9.5 mg/dL will continue to monitor.  Thomasene Ripple, PharmD Clinical Pharmacist 07/19/2018,2:58 AM

## 2018-07-20 ENCOUNTER — Inpatient Hospital Stay: Payer: Medicare HMO

## 2018-07-20 LAB — BASIC METABOLIC PANEL
Anion gap: 3 — ABNORMAL LOW (ref 5–15)
BUN: 31 mg/dL — ABNORMAL HIGH (ref 8–23)
CO2: 24 mmol/L (ref 22–32)
Calcium: 7.9 mg/dL — ABNORMAL LOW (ref 8.9–10.3)
Chloride: 108 mmol/L (ref 98–111)
Creatinine, Ser: 0.76 mg/dL (ref 0.44–1.00)
GFR calc Af Amer: >60 mL/min (ref >60)
GFR calc non Af Amer: >60 mL/min (ref >60)
Glucose, Bld: 92 mg/dL (ref 70–99)
Potassium: 4.4 mmol/L (ref 3.5–5.1)
Sodium: 135 mmol/L (ref 135–145)

## 2018-07-20 LAB — GLUCOSE, CAPILLARY
GLUCOSE-CAPILLARY: 158 mg/dL — AB (ref 70–99)
Glucose-Capillary: 76 mg/dL (ref 70–99)
Glucose-Capillary: 113 mg/dL — ABNORMAL HIGH (ref 70–99)
Glucose-Capillary: 136 mg/dL — ABNORMAL HIGH (ref 70–99)

## 2018-07-20 LAB — CBC
HCT: 28.8 % — ABNORMAL LOW (ref 36.0–46.0)
Hemoglobin: 8.9 g/dL — ABNORMAL LOW (ref 12.0–15.0)
MCH: 29.3 pg (ref 26.0–34.0)
MCHC: 30.9 g/dL (ref 30.0–36.0)
MCV: 94.7 fL (ref 80.0–100.0)
NRBC: 0 % (ref 0.0–0.2)
Platelets: 174 10*3/uL (ref 150–400)
RBC: 3.04 MIL/uL — ABNORMAL LOW (ref 3.87–5.11)
RDW: 15.9 % — ABNORMAL HIGH (ref 11.5–15.5)
WBC: 10.7 10*3/uL — AB (ref 4.0–10.5)

## 2018-07-20 LAB — HEPARIN LEVEL (UNFRACTIONATED)
Heparin Unfractionated: 0.84 IU/mL — ABNORMAL HIGH (ref 0.30–0.70)
Heparin Unfractionated: 0.7 IU/mL (ref 0.30–0.70)
Heparin Unfractionated: 0.54 IU/mL (ref 0.30–0.70)

## 2018-07-20 LAB — PROCALCITONIN: Procalcitonin: 0.48 ng/mL

## 2018-07-20 MED ORDER — FUROSEMIDE 10 MG/ML IJ SOLN
20.0000 mg | Freq: Once | INTRAMUSCULAR | Status: AC
  Administered 2018-07-20: 20 mg via INTRAVENOUS
  Filled 2018-07-20: qty 2

## 2018-07-20 NOTE — Progress Notes (Signed)
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  Allergies  Allergen Reactions  . Eggs Or Egg-Derived Products Diarrhea, Itching and Nausea And Vomiting    Patient Measurements: Height: 5\' 2"  (157.5 cm) Weight: 115 lb 1.3 oz (52.2 kg) IBW/kg (Calculated) : 50.1 Heparin Dosing Weight: 36.6 kg - reported by Jacqulyn Cane RN  Vital Signs: Temp: 98.2 F (36.8 C) (01/19 1600) Temp Source: Oral (01/19 1600) BP: 115/68 (01/19 1900) Pulse Rate: 101 (01/19 1900)  Labs: Recent Labs    07/18/18 0210  07/19/18 0104 07/19/18 0440  07/20/18 0518 07/20/18 1308 07/20/18 1940  HGB 6.5*   < >  --  8.9*  --  8.9*  --   --   HCT 21.8*  --   --  28.9*  --  28.8*  --   --   PLT 172  --   --  186  --  174  --   --   HEPARINUNFRC 0.41   < > 0.44  --    < > 0.84* 0.70 0.54  CREATININE 0.87  --  0.86  --   --  0.76  --   --   TROPONINI 0.40*  --   --   --   --   --   --   --    < > = values in this interval not displayed.    Estimated Creatinine Clearance: 56.9 mL/min (by C-G formula based on SCr of 0.76 mg/dL).   Assessment: Pharmacy was consulted for heparin.  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 Will follow aPTT.   Goal of Therapy:  APTT level 66-102 seconds or Heparin level 0.3-0.7 units/ml once heparin level correlates with aPTT  Monitor platelets by anticoagulation protocol: Yes  Heparin Course: 1/13 AM heparin level 0.24. 600 unit  bolus and increase rate t o 850 units/hr. Recheck in 6 hours.  1/13:  HL @ 2055 = 0.17 .   Heparin was not held during any point on 1/13, angiogram was not done.   Will order heparin 1750 units IV X 1 once and increase drip rate to 1050 units/hr.  Will recheck HL 6 hrs after rate change.  1/14 Patient had angiogram with heparin on hold. 01/17 @ 0200 HL 0.41 therapeutic. Will continue current  rate and will recheck HL @ 0800, Hgb now below 7 @ 6.5 will continue to monitor.  01/17 @ 0500 patient's heparin drip to be held for central line placement will retime HL for 1200. 01/18 @ 0100 HL 0.44 therapeutic. Will continue current rate and will recheck HL @ 0700. Patient is s/p 2 transfusions w/ a hgb of 9.5 mg/dL will continue to monitor. 01/19 @ 0500 HL 0.84 supratherapeutic. Will decrease rate to 1150 units/hr and will recheck @ 1300, will continue monitoring CBC.  Plan:  1/19 1308 HL= 0.54. Will continue current rate of heparin 1100 units/hr and f/u confirmatory level in 6 hrs.  Orinda Kenner, PharmD Clinical Pharmacist 07/20/2018,7:58 PM

## 2018-07-20 NOTE — Progress Notes (Signed)
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  Allergies  Allergen Reactions  . Eggs Or Egg-Derived Products Diarrhea, Itching and Nausea And Vomiting    Patient Measurements: Height: 5\' 2"  (157.5 cm) Weight: 115 lb 1.3 oz (52.2 kg) IBW/kg (Calculated) : 50.1 Heparin Dosing Weight: 36.6 kg - reported by Jacqulyn Cane RN  Vital Signs: Temp: 98.3 F (36.8 C) (01/19 0200) Temp Source: Oral (01/19 0200) BP: 100/60 (01/19 0500) Pulse Rate: 78 (01/19 0500)  Labs: Recent Labs    07/18/18 0210  07/19/18 0104 07/19/18 0440 07/19/18 0709 07/20/18 0518  HGB 6.5*   < >  --  8.9*  --  8.9*  HCT 21.8*  --   --  28.9*  --  28.8*  PLT 172  --   --  186  --  174  HEPARINUNFRC 0.41   < > 0.44  --  0.67 0.84*  CREATININE 0.87  --  0.86  --   --  0.76  TROPONINI 0.40*  --   --   --   --   --    < > = values in this interval not displayed.    Estimated Creatinine Clearance: 56.9 mL/min (by C-G formula based on SCr of 0.76 mg/dL).   Assessment: Pharmacy was consulted for heparin.  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 Will follow aPTT.   Goal of Therapy:  APTT level 66-102 seconds or Heparin level 0.3-0.7 units/ml once heparin level correlates with aPTT  Monitor platelets by anticoagulation protocol: Yes  Heparin Course: 1/13 AM heparin level 0.24. 600 unit  bolus and increase rate t o 850 units/hr. Recheck in 6 hours.  1/13:  HL @ 2055 = 0.17 .   Heparin was not held during any point on 1/13, angiogram was not done.   Will order heparin 1750 units IV X 1 once and increase drip rate to 1050 units/hr.  Will recheck HL 6 hrs after rate change.  1/14 Patient had angiogram with heparin on hold. 01/17 @ 0200 HL 0.41 therapeutic. Will continue current rate and will recheck HL @ 0800, Hgb now below 7 @ 6.5 will continue to  monitor.  01/17 @ 0500 patient's heparin drip to be held for central line placement will retime HL for 1200. 01/18 @ 0100 HL 0.44 therapeutic. Will continue current rate and will recheck HL @ 0700. Patient is s/p 2 transfusions w/ a hgb of 9.5 mg/dL will continue to monitor.  Plan:  01/19 @ 0500 HL 0.84 supratherapeutic. Will decrease rate to 1150 units/hr and will recheck @ 1300, will continue monitoring CBC.  Thomasene Ripple, PharmD Clinical Pharmacist 07/20/2018,6:59 AM

## 2018-07-20 NOTE — Progress Notes (Addendum)
[facility] [place] [facility]   PATIENT NAME: April DonnaDoris [name]    MR#:  [id number]  DATE OF BIRTH:  10/06/1955  SUBJECTIVE:  CHIEF COMPLAINT:  Patient doing well denies any complaints  REVIEW OF SYSTEMS:  CONSTITUTIONAL: No fever, fatigue or weakness.  EYES: No blurred or double vision.  EARS, NOSE, AND THROAT: No tinnitus or ear pain.  RESPIRATORY: No cough, shortness of breath, wheezing or hemoptysis.  CARDIOVASCULAR: No chest pain, orthopnea, edema.  GASTROINTESTINAL: No nausea, vomiting, diarrhea or abdominal pain.  GENITOURINARY: No dysuria, hematuria.  ENDOCRINE: No polyuria, nocturia,  HEMATOLOGY: No anemia, easy bruising or bleeding SKIN: Right foot in dressing and has left heel ulcer MUSCULOSKELETAL: No joint pain or arthritis.   NEUROLOGIC: No tingling, numbness, weakness.  PSYCHIATRY: No anxiety or depression.   DRUG ALLERGIES:   Allergies  Allergen Reactions  . Eggs Or Egg-Derived Products Diarrhea, Itching and Nausea And Vomiting    VITALS:  Blood pressure 117/69, pulse (!) 114, temperature 98 F (36.7 C), temperature source Axillary, resp. rate (!) 25, height 5\' 2"  (1.575 m), weight 52.2 kg, SpO2 99 %.  PHYSICAL EXAMINATION:  GENERAL:  63 y.o.-year-old patient lying in the bed with no acute distress.  EYES: Pupils equal, round, reactive to light and accommodation. No scleral icterus. Extraocular muscles intact.  HEENT: Head atraumatic, normocephalic. Oropharynx and nasopharynx clear.  NECK:  Supple, no jugular venous distention. No thyroid enlargement, no tenderness.  LUNGS: Normal breath sounds bilaterally, no wheezing, rales,rhonchi or crepitation. No use of accessory muscles of respiration.  CARDIOVASCULAR: S1, S2 normal. No murmurs, rubs, or gallops.  ABDOMEN: Soft, nontender, nondistended. Bowel sounds present.  EXTREMITIES: No pedal edema, cyanosis, or clubbing.  NEUROLOGIC: Awake, alert and oriented x3 sensation intact.  Gait not checked.  PSYCHIATRIC: The patient is alert and oriented x 3.  SKIN: Right foot with wound VAC left heel ulcer with necrosis  LABORATORY PANEL:   CBC Recent Labs  Lab 07/20/18 0518  WBC 10.7*  HGB 8.9*  HCT 28.8*  PLT 174   ------------------------------------------------------------------------------------------------------------------  Chemistries  Recent Labs  Lab 07/17/18 0526  07/20/18 0518  NA 134*   < > 135  K 4.7   < > 4.4  CL 99   < > 108  CO2 25   < > 24  GLUCOSE 260*   < > 92  BUN 30*   < > 31*  CREATININE 1.04*   < > 0.76  CALCIUM 8.3*   < > 7.9*  MG 2.1  --   --    < > = values in this interval not displayed.   ------------------------------------------------------------------------------------------------------------------  Cardiac Enzymes Recent Labs  Lab 07/18/18 0210  TROPONINI 0.40*   ------------------------------------------------------------------------------------------------------------------  RADIOLOGY:  Mr Ankle Left Wo Contrast  Result Date: 07/18/2018 CLINICAL DATA:  Calcaneal osteomyelitis.  Follow-up. EXAM: MRI OF THE LEFT ANKLE WITHOUT CONTRAST TECHNIQUE: Multiplanar, multisequence MR imaging of the ankle was performed. No intravenous contrast was administered. COMPARISON:  Foot radiographs 07/20/2018. Left forefoot MRI 05/24/2018. FINDINGS: Previous examination included most of the foot, but not the calcaneal tuberosity. TENDONS Peroneal: Intact and normally positioned. Posteromedial: Intact and normally positioned. Trace fluid in the posterior tibialis tendon sheath. Anterior: Intact and normally positioned. Achilles: Intact.  Trace fluid in the retrocalcaneal bursa. Plantar Fascia: Intact. LIGAMENTS Lateral: The anterior and posterior talofibular and calcaneofibular ligaments are intact. Medial: The deltoid and visualized portions of the spring ligament appear intact. CARTILAGE AND BONES Ankle Joint:  No significant ankle joint  effusion. The talar dome and tibial plafond are intact. Subtalar Joints/Sinus Tarsi: Unremarkable. Bones: There is a large area of soft tissue ulceration posterior to the calcaneal tuberosity. There is underlying prominent marrow T2 hyperintensity in the calcaneal tuberosity. In addition, there is associated decreased T1 signal, but no definite cortical destruction. The additional bones appear unremarkable. Other: As above, large area of soft tissue ulceration posterior to the calcaneal tuberosity without underlying fluid collection. There is diffuse subcutaneous edema surrounding the ankle and extending into the dorsum of the midfoot and forefoot. No focal fluid collections are seen. 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 IMPRESSION: 1. Large area of soft tissue ulceration over the posterior aspect of the heel with underlying marrow T2 hyperintensity and T1 hypointensity in the calcaneal tuberosity consistent with osteomyelitis. This area was not previously imaged. 2. Generalized cellulitis and muscular T2 hyperintensity. No focal fluid collection. 3. No significant ankle tendon or ligament pathology. Electronically Signed   By: Carey Bullocks M.D.   On: 07/18/2018 14:51   Dg Chest Port 1 View  Result Date: 07/19/2018 CLINICAL DATA:  Recent pneumonia EXAM: PORTABLE CHEST 1 VIEW COMPARISON:  January 16, 2019 FINDINGS: Central catheter tip is in superior vena cava. No pneumothorax. There is a small pleural effusion on each side with right base atelectasis. There is no airspace consolidation currently. Heart is upper normal in size with pulmonary vascularity normal. There is aortic atherosclerosis. Bones are osteoporotic. IMPRESSION: Central catheter tip in superior vena cava. No pneumothorax. Paragraphs pleural effusions bilaterally with right base atelectasis. No frank consolidation evident currently. Subtle opacity in the right upper lobe seen 1 day  prior is no longer appreciable. Stable cardiac silhouette.  There is aortic atherosclerosis. Aortic Atherosclerosis (ICD10-I70.0). Electronically Signed   By: Bretta Bang III M.D.   On: 07/19/2018 08:30    EKG:   Orders placed or performed during the hospital encounter of 07/09/18  . EKG 12-Lead  . EKG 12-Lead  . EKG 12-Lead  . EKG 12-Lead  . EKG 12-Lead  . EKG 12-Lead    ASSESSMENT AND PLAN:     #1 bilateral infected diabetic foot wounds-  right foot osteomyelitis  Status post evaluation by podiatry As well as vascular surgery Patient with severe peripheral vascular disease status post intervention Continue cefepime  #2  Hypotension off Levophed  #3 non-ST MI patient not candidate for any type of invasive cardiac procedure according to cardiology due to anemia and other multiple issues going on Plan to medically manage   #4 recurrent left lower extremity DVT-on Xarelto at home. -Continue heparin  #5 COPD-stable, no signs of acute exacerbation -Continue home inhalers  #6 uncontrolled type 2 diabetes- recent A1c 10.9. -Continue Lantus adjusted as needed  #7 hyperlipidemia-stable -Continue home statin  #8 anxiety-stable -Continue home Xanax  #9 severe protein-calorie malnutrition -Continue home Premier protein twice daily between meals  #10 head lice-that is post treatment permethrin  #11 tobacco abuse -Nicotine patch as needed  CODE STATUS: fc  TOTAL TIME TAKING CARE OF THIS PATIENT: 33 minutes.  Disposition pending current intervention.  Note: This dictation was prepared with Dragon dictation along with smaller phrase technology. Any transcriptional errors that result from this process are unintentional.   Auburn Bilberry M.D on 07/20/2018 at 12:13 PM  Between 7am to 6pm - Pager - [phone] After 6pm go to [email or web address] - Scientist, research (life sciences) Hospitalists  Office  [phone]  CC: Primary care physician; Mickel Fuchs,  MD

## 2018-07-20 NOTE — Progress Notes (Signed)
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  Allergies  Allergen Reactions  . Eggs Or Egg-Derived Products Diarrhea, Itching and Nausea And Vomiting    Patient Measurements: Height: 5\' 2"  (157.5 cm) Weight: 115 lb 1.3 oz (52.2 kg) IBW/kg (Calculated) : 50.1 Heparin Dosing Weight: 36.6 kg - reported by Jacqulyn Cane RN  Vital Signs: Temp: 98.1 F (36.7 C) (01/19 1200) Temp Source: Axillary (01/19 1200) BP: 126/74 (01/19 1200) Pulse Rate: 101 (01/19 1200)  Labs: Recent Labs    07/18/18 0210  07/19/18 0104 07/19/18 0440 07/19/18 0709 07/20/18 0518 07/20/18 1308  HGB 6.5*   < >  --  8.9*  --  8.9*  --   HCT 21.8*  --   --  28.9*  --  28.8*  --   PLT 172  --   --  186  --  174  --   HEPARINUNFRC 0.41   < > 0.44  --  0.67 0.84* 0.70  CREATININE 0.87  --  0.86  --   --  0.76  --   TROPONINI 0.40*  --   --   --   --   --   --    < > = values in this interval not displayed.    Estimated Creatinine Clearance: 56.9 mL/min (by C-G formula based on SCr of 0.76 mg/dL).   Assessment: Pharmacy was consulted for heparin.  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 Will follow aPTT.   Goal of Therapy:  APTT level 66-102 seconds or Heparin level 0.3-0.7 units/ml once heparin level correlates with aPTT  Monitor platelets by anticoagulation protocol: Yes  Heparin Course: 1/13 AM heparin level 0.24. 600 unit  bolus and increase rate t o 850 units/hr. Recheck in 6 hours.  1/13:  HL @ 2055 = 0.17 .   Heparin was not held during any point on 1/13, angiogram was not done.   Will order heparin 1750 units IV X 1 once and increase drip rate to 1050 units/hr.  Will recheck HL 6 hrs after rate change.  1/14 Patient had angiogram with heparin on hold. 01/17 @ 0200 HL 0.41 therapeutic. Will continue current rate and will recheck HL @  0800, Hgb now below 7 @ 6.5 will continue to monitor.  01/17 @ 0500 patient's heparin drip to be held for central line placement will retime HL for 1200. 01/18 @ 0100 HL 0.44 therapeutic. Will continue current rate and will recheck HL @ 0700. Patient is s/p 2 transfusions w/ a hgb of 9.5 mg/dL will continue to monitor. 01/19 @ 0500 HL 0.84 supratherapeutic. Will decrease rate to 1150 units/hr and will recheck @ 1300, will continue monitoring CBC.  Plan:  1/19 1308 HL= 0.70. Patient at upper end of goal range. Will slightly decrease rate to 1100 units/hr and f/u confirmatory level in 6 hrs.  Angelique Blonder, PharmD Clinical Pharmacist 07/20/2018,1:52 PM

## 2018-07-20 NOTE — Progress Notes (Signed)
Pharmacy Antibiotic Note  April [name] is a 63 y.o. female admitted on 07/09/2018 with Wound infection.  Pharmacy has been consulted for vancomycin dosing. During an admission here in November she received vancomycin for the same indication and her regimen was 1 gram every 24 hours resulting in a Vt of 12 mcg/mL. Vancomycin dose was started at a dose of 1000 mg every 24 hours (1st dose was 750mg ). A vancomycin peak level was drawn at steady-state and a random level the next morning were used to create a pharmacokinetic profile. AUC of 580 was calculated.   Plan: 1/17 Scr increased. Vancomycin dose changed to 750mg  IV every 24 hours. Goal AUC 400-550. Expected AUC: 470 SCr used: 0.86 Will continue to monitor renal function and adjust dose as needed. Will order post distribution vanc level and vanc trough 1/19-1/20  -Continue patient on cefepime 1g IV q12h per CrCl 30-60 ml/min  1/19 Scr 0.87>> 0.86 >> 0.76. Will order Vanc peak for after tonights 2200 dose (ordered peak for 1/20 at 0100) and will order Vanc trough prior to tomorrows 1/20 dose.     Height: 5\' 2"  (157.5 cm) Weight: 115 lb 1.3 oz (52.2 kg) IBW/kg (Calculated) : 50.1  Temp (24hrs), Avg:98.3 F (36.8 C), Min:97.7 F (36.5 C), Max:99 F (37.2 C)  Recent Labs  Lab 07/13/18 2056  07/14/18 1432 07/14/18 1656  07/16/18 1737 07/17/18 0526 07/18/18 0210 07/19/18 0104 07/19/18 0440 07/20/18 0518  WBC  --    < >  --   --   --  13.0* 11.1* 11.3*  --  12.6* 10.7*  CREATININE  --   --  0.82  --    < > 0.76 1.04* 0.87 0.86  --  0.76  VANCOTROUGH  --   --   --  17  --   --   --   --   --   --   --   VANCOPEAK 32  --   --   --   --   --   --   --   --   --   --   VANCORANDOM  --   --  17  --   --   --   --   --   --   --   --    < > = values in this interval not displayed.    Estimated Creatinine Clearance: 56.9 mL/min (by C-G formula based on SCr of 0.76 mg/dL).    Antimicrobials this admission: 1/8 vancomcyin >>  1/8  pip/tazo >> 1/12 1/17 cefepime>>   Dose adjustments this admission: 1/9 Vancomycin 750mg  every 36 hours-----> 1000mg  q24h 1/13 vancomycin 1000mg  q24h----> 1250mg  q24h (dose not administered) 1/14 vancomycin 1250mg  q24h----> 1000mg  q24h 1/17 Vancomycin 1000 mg q24h ----> 750mg  q24h  Microbiology results: 1/8 Heel Cx: MRSA  Thank you for allowing pharmacy to be a part of this patient's care.  Angelique Blonder, PharmD Clinical Pharmacist 07/20/2018 8:25 AM

## 2018-07-20 NOTE — Progress Notes (Signed)
3 Days Post-Op   Subjective/Chief Complaint: Patient seen.  Still complains of some pain in the legs.  Mostly in the back of her left leg and heel.   Objective: Vital signs in last 24 hours: Temp:  [97.7 F (36.5 C)-98.3 F (36.8 C)] 98.1 F (36.7 C) (01/19 1200) Pulse Rate:  [78-114] 101 (01/19 1200) Resp:  [12-27] 16 (01/19 1200) BP: (95-126)/(49-87) 126/74 (01/19 1200) SpO2:  [97 %-100 %] 100 % (01/19 1200) Weight:  [52.2 kg] 52.2 kg (01/19 0150) Last BM Date: 07/14/18  Intake/Output from previous day: 01/18 0701 - 01/19 0700 In: 502.2 [P.O.:240; I.V.:262.2] Out: 500 [Urine:500] Intake/Output this shift: Total I/O In: 546.6 [P.O.:150; I.V.:96.6; IV Piggyback:300] Out: 120 [Urine:120]  Wound VAC intact on the right with some mild continued drainage.  Cushioned dressing intact on the left heel.  Lab Results:  Recent Labs    07/19/18 0440 07/20/18 0518  WBC 12.6* 10.7*  HGB 8.9* 8.9*  HCT 28.9* 28.8*  PLT 186 174   BMET Recent Labs    07/19/18 0104 07/20/18 0518  NA 133* 135  K 5.3* 4.4  CL 107 108  CO2 20* 24  GLUCOSE 131* 92  BUN 33* 31*  CREATININE 0.86 0.76  CALCIUM 7.5* 7.9*   PT/INR No results for input(s): LABPROT, INR in the last 72 hours. ABG No results for input(s): PHART, HCO3 in the last 72 hours.  Invalid input(s): PCO2, PO2  Studies/Results: Dg Chest 1 View  Result Date: 07/20/2018 CLINICAL DATA:  Pitting edema.  History of pneumonia. EXAM: CHEST  1 VIEW COMPARISON:  07/19/2018 FINDINGS: Stable position of the right jugular central line with the tip in the SVC region. Heart size is within normal limits and stable. Hazy interstitial densities in the lower lungs are similar to the recent comparison examination. Question slightly increased densities in the right perihilar region. Negative for a pneumothorax. IMPRESSION: Slightly increased densities in the right perihilar region are nonspecific could be related to overlying structures.  Difficult to exclude slightly increased interstitial edema. Persistent patchy and hazy densities at the lung bases are suggestive for pleural fluid and airspace disease/atelectasis. Stable position of the central line. Electronically Signed   By: Richarda OverlieAdam  Henn M.D.   On: 07/20/2018 12:42   Mr Ankle Left Wo Contrast  Result Date: 07/18/2018 CLINICAL DATA:  Calcaneal osteomyelitis.  Follow-up. EXAM: MRI OF THE LEFT ANKLE WITHOUT CONTRAST TECHNIQUE: Multiplanar, multisequence MR imaging of the ankle was performed. No intravenous contrast was administered. COMPARISON:  Foot radiographs 07/20/2018. Left forefoot MRI 05/24/2018. FINDINGS: Previous examination included most of the foot, but not the calcaneal tuberosity. TENDONS Peroneal: Intact and normally positioned. Posteromedial: Intact and normally positioned. Trace fluid in the posterior tibialis tendon sheath. Anterior: Intact and normally positioned. Achilles: Intact.  Trace fluid in the retrocalcaneal bursa. Plantar Fascia: Intact. LIGAMENTS Lateral: The anterior and posterior talofibular and calcaneofibular ligaments are intact. Medial: The deltoid and visualized portions of the spring ligament appear intact. CARTILAGE AND BONES Ankle Joint: No significant ankle joint effusion. The talar dome and tibial plafond are intact. Subtalar Joints/Sinus Tarsi: Unremarkable. Bones: There is a large area of soft tissue ulceration posterior to the calcaneal tuberosity. There is underlying prominent marrow T2 hyperintensity in the calcaneal tuberosity. In addition, there is associated decreased T1 signal, but no definite cortical destruction. The additional bones appear unremarkable. Other: As above, large area of soft tissue ulceration posterior to the calcaneal tuberosity without underlying fluid collection. There is diffuse subcutaneous edema surrounding  the ankle and extending into the dorsum of the midfoot and forefoot. No focal fluid collections are seen. 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 IMPRESSION: 1. Large area of soft tissue ulceration over the posterior aspect of the heel with underlying marrow T2 hyperintensity and T1 hypointensity in the calcaneal tuberosity consistent with osteomyelitis. This area was not previously imaged. 2. Generalized cellulitis and muscular T2 hyperintensity. No focal fluid collection. 3. No significant ankle tendon or ligament pathology. Electronically Signed   By: Carey Bullocks M.D.   On: 07/18/2018 14:51   Dg Chest Port 1 View  Result Date: 07/19/2018 CLINICAL DATA:  Recent pneumonia EXAM: PORTABLE CHEST 1 VIEW COMPARISON:  January 16, 2019 FINDINGS: Central catheter tip is in superior vena cava. No pneumothorax. There is a small pleural effusion on each side with right base atelectasis. There is no airspace consolidation currently. Heart is upper normal in size with pulmonary vascularity normal. There is aortic atherosclerosis. Bones are osteoporotic. IMPRESSION: Central catheter tip in superior vena cava. No pneumothorax. Paragraphs pleural effusions bilaterally with right base atelectasis. No frank consolidation evident currently. Subtle opacity in the right upper lobe seen 1 day prior is no longer appreciable. Stable cardiac silhouette.  There is aortic atherosclerosis. Aortic Atherosclerosis (ICD10-I70.0). Electronically Signed   By: Bretta Bang III M.D.   On: 07/19/2018 08:30    Anti-infectives: Anti-infectives (From admission, onward)   Start     Dose/Rate Route Frequency Ordered Stop   07/18/18 2200  vancomycin (VANCOCIN) IVPB 750 mg/150 ml premix     750 mg 150 mL/hr over 60 Minutes Intravenous Every 24 hours 07/18/18 1141     07/18/18 0400  ceFEPIme (MAXIPIME) 1 g in sodium chloride 0.9 % 100 mL IVPB     1 g 200 mL/hr over 30 Minutes Intravenous Every 12 hours 07/18/18 0358     07/17/18 0500  ceFAZolin (ANCEF) IVPB 2g/100 mL premix    Note to  Pharmacy:  Send with pt to OR   2 g 200 mL/hr over 30 Minutes Intravenous On call 07/16/18 1214 07/17/18 1502   07/15/18 1800  vancomycin (VANCOCIN) IVPB 1000 mg/200 mL premix  Status:  Discontinued     1,000 mg 200 mL/hr over 60 Minutes Intravenous Every 24 hours 07/15/18 1232 07/18/18 1141   07/15/18 0500  ceFAZolin (ANCEF) IVPB 2g/100 mL premix    Note to Pharmacy:  Send with patient to specials   2 g 200 mL/hr over 30 Minutes Intravenous On call 07/14/18 1704 07/15/18 1425   07/14/18 2230  ceFAZolin (ANCEF) IVPB 2g/100 mL premix     2 g 200 mL/hr over 30 Minutes Intravenous  Once 07/14/18 2222 07/16/18 0206   07/14/18 1800  vancomycin (VANCOCIN) 1,250 mg in sodium chloride 0.9 % 250 mL IVPB  Status:  Discontinued     1,250 mg 166.7 mL/hr over 90 Minutes Intravenous Every 24 hours 07/14/18 1517 07/15/18 1232   07/11/18 1800  vancomycin (VANCOCIN) IVPB 1000 mg/200 mL premix  Status:  Discontinued     1,000 mg 200 mL/hr over 60 Minutes Intravenous Every 24 hours 07/11/18 1631 07/14/18 1517   07/11/18 1645  piperacillin-tazobactam (ZOSYN) IVPB 3.375 g  Status:  Discontinued     3.375 g 12.5 mL/hr over 240 Minutes Intravenous Every 8 hours 07/11/18 1631 07/13/18 1847   07/10/18 1600  vancomycin (VANCOCIN) IVPB 1000 mg/200 mL premix  Status:  Discontinued     1,000 mg 200 mL/hr  over 60 Minutes Intravenous Every 24 hours 07/10/18 1507 07/11/18 1518   07/09/18 2000  piperacillin-tazobactam (ZOSYN) IVPB 3.375 g  Status:  Discontinued     3.375 g 12.5 mL/hr over 240 Minutes Intravenous Every 8 hours 07/09/18 1911 07/11/18 1518   07/09/18 2000  vancomycin (VANCOCIN) IVPB 750 mg/150 ml premix  Status:  Discontinued     750 mg 150 mL/hr over 60 Minutes Intravenous Every 36 hours 07/09/18 1911 07/10/18 1507      Assessment/Plan: s/p Procedure(s): ENDARTERECTOMY FEMORAL - BILATERAL SFA STENT PLACEMENT - LEFT (Bilateral) Assessment: Osteomyelitis with severe peripheral vascular disease.    Plan: Wound VAC and dressings left intact.  Discussed with the patient and family again treatment options for the infection in her left heel which would include debriding the bone and ulceration from her left heel versus proceeding with a higher amputation.  Talked with the patient and family members that I will discuss this with Dr. Wyn Quaker tomorrow and we will need to come to some type of decision as far as which direction to proceed.  Follow-up with the patient and family if available at some point tomorrow  LOS: 11 days    Ricci Barker 07/20/2018

## 2018-07-20 NOTE — Progress Notes (Addendum)
CRITICAL CARE NOTE  CC  Diabetic foot infection Ischemic LE SUBJECTIVE Patient remains critically ill Prognosis is guarded + nausea with retching. + sob. Pain at the femoral endarterectomy site Poor apetite. No CP.     SIGNIFICANT EVENTS None    BP 112/64 (BP Location: Right Arm)   Pulse 88   Temp 98 F (36.7 C) (Axillary)   Resp 16   Ht 5\' 2"  (1.575 m)   Wt 52.2 kg   SpO2 97%   BMI 21.05 kg/m    REVIEW OF SYSTEMS  See subjective PHYSICAL EXAMINATION:  GENERAL:critically ill appearing, noresp distress HEAD: Normocephalic, atraumatic.  EYES: Pupils equal, round, reactive to light.  No scleral icterus.  MOUTH: Moist mucosal membrane. NECK: Supple. No thyromegaly. No nodules. No JVD.  PULMONARY: +rhonchi, +wheezing CARDIOVASCULAR: S1 and S2. Regular rate and rhythm. No murmurs, rubs, or gallops.  GASTROINTESTINAL: Soft, nontender, -distended. No masses. Positive bowel sounds. No hepatosplenomegaly.  MUSCULOSKELETAL: bilateral wound at femoral endarterectomy site Both feet with dressing  dopplerable LE pulses NEUROLOGIC: awake and alert SKIN:intact,warm,dry  INTAKE/OUTPUT  Intake/Output Summary (Last 24 hours) at 07/20/2018 0855 Last data filed at 07/20/2018 0827 Gross per 24 hour  Intake 437.87 ml  Output 620 ml  Net -182.13 ml    LABS  CBC Recent Labs  Lab 07/18/18 0210 07/18/18 1450 07/19/18 0440 07/20/18 0518  WBC 11.3*  --  12.6* 10.7*  HGB 6.5* 9.5* 8.9* 8.9*  HCT 21.8*  --  28.9* 28.8*  PLT 172  --  186 174   Coag's Recent Labs  Lab 07/17/18 0526  APTT 73*  INR 1.17   BMET Recent Labs  Lab 07/18/18 0210 07/19/18 0104 07/20/18 0518  NA 133* 133* 135  K 4.7 5.3* 4.4  CL 103 107 108  CO2 24 20* 24  BUN 28* 33* 31*  CREATININE 0.87 0.86 0.76  GLUCOSE 234* 131* 92   Electrolytes Recent Labs  Lab 07/15/18 0609  07/16/18 1737 07/17/18 0526 07/18/18 0210 07/19/18 0104 07/20/18 0518  CALCIUM 8.4*   < > 8.1* 8.3* 7.9*  7.5* 7.9*  MG 2.2  --  1.9 2.1  --   --   --    < > = values in this interval not displayed.   Sepsis Markers Recent Labs  Lab 07/18/18 0210 07/19/18 0104 07/20/18 0518  PROCALCITON 0.77 0.88 0.48   ABG No results for input(s): PHART, PCO2ART, PO2ART in the last 168 hours. Liver Enzymes No results for input(s): AST, ALT, ALKPHOS, BILITOT, ALBUMIN in the last 168 hours. Cardiac Enzymes Recent Labs  Lab 07/16/18 1737 07/17/18 0215 07/18/18 0210  TROPONINI 0.11* 1.19* 0.40*   Glucose Recent Labs  Lab 07/18/18 2129 07/19/18 0754 07/19/18 1209 07/19/18 1601 07/19/18 2042 07/20/18 0750  GLUCAP 109* 95 146* 190* 113* 76     Recent Results (from the past 240 hour(s))  MRSA PCR Screening     Status: Abnormal   Collection Time: 07/10/18  6:21 PM  Result Value Ref Range Status   MRSA by PCR POSITIVE (A) NEGATIVE Final    Comment:        The GeneXpert MRSA Assay (FDA approved for NASAL specimens only), is one component of a comprehensive MRSA colonization surveillance program. It is not intended to diagnose MRSA infection nor to guide or monitor treatment for MRSA infections. RESULT CALLED TO, READ BACK BY AND VERIFIED WITH: KRISTY BASS 07/10/18 1957 KLW Performed at [facility], [address], [place], [place] [address]  Aerobic/Anaerobic Culture (surgical/deep wound)     Status: None   Collection Time: 07/11/18 12:40 PM  Result Value Ref Range Status   Specimen Description   Final    ULCER RIGHT FOOT WOUND Performed at [facility], [address], [place], [place] [address]    Special Requests   Final    NONE Performed at [facility], [address] [place], [place] [address]    Gram Stain NO WBC SEEN NO ORGANISMS SEEN   Final   Culture   Final    MODERATE METHICILLIN RESISTANT STAPHYLOCOCCUS AUREUS MODERATE STREPTOCOCCUS PYOGENES NO ANAEROBES ISOLATED Performed at [facility], [address] [place],  [place] [address]    Report Status 07/16/2018 FINAL  Final   Organism ID, Bacteria METHICILLIN RESISTANT STAPHYLOCOCCUS AUREUS  Final      Susceptibility   Methicillin resistant staphylococcus aureus - MIC*    CIPROFLOXACIN >=8 RESISTANT Resistant     ERYTHROMYCIN <=0.25 SENSITIVE Sensitive     GENTAMICIN <=0.5 SENSITIVE Sensitive     OXACILLIN >=4 RESISTANT Resistant     TETRACYCLINE <=1 SENSITIVE Sensitive     VANCOMYCIN 1 SENSITIVE Sensitive     TRIMETH/SULFA <=10 SENSITIVE Sensitive     CLINDAMYCIN <=0.25 SENSITIVE Sensitive     RIFAMPIN <=0.5 SENSITIVE Sensitive     Inducible Clindamycin NEGATIVE Sensitive     * MODERATE METHICILLIN RESISTANT STAPHYLOCOCCUS AUREUS  CULTURE, BLOOD (ROUTINE X 2) w Reflex to ID Panel     Status: None (Preliminary result)   Collection Time: 07/18/18  4:30 AM  Result Value Ref Range Status   Specimen Description BLOOD BLOOD RIGHT FOREARM  Final   Special Requests   Final    BOTTLES DRAWN AEROBIC AND ANAEROBIC Blood Culture adequate volume   Culture   Final    NO GROWTH 2 DAYS Performed at [facility], [address], [place], [place] [address]    Report Status PENDING  Incomplete  CULTURE, BLOOD (ROUTINE X 2) w Reflex to ID Panel     Status: None (Preliminary result)   Collection Time: 07/18/18  4:30 AM  Result Value Ref Range Status   Specimen Description BLOOD RIGHT ANTECUBITAL  Final   Special Requests   Final    BOTTLES DRAWN AEROBIC AND ANAEROBIC Blood Culture adequate volume   Culture   Final    NO GROWTH 2 DAYS Performed at [facility], [address] [place], [place] [address]    Report Status PENDING  Incomplete  Culture, Urine     Status: None   Collection Time: 07/18/18  5:40 AM  Result Value Ref Range Status   Specimen Description   Final    URINE, RANDOM Performed at [facility], [address] [place], [place] [address]    Special Requests   Final    Normal Performed at [facility],  [address] [place], [place] [address]    Culture   Final    NO GROWTH Performed at [facility], [address], [place], [place] [address]    Report Status 07/19/2018 FINAL  Final    MEDICATIONS   Current Facility-Administered Medications:  .  0.9 %  sodium chloride infusion (Manually program via Guardrails IV Fluids), , Intravenous, Once, Harlon Ditty D, NP, Last Rate: 10 mL/hr at 07/18/18 0431 .  0.9 %  sodium chloride infusion (Manually program via Guardrails IV Fluids), , Intravenous, Once, Judithe Modest, NP .  0.9 %  sodium chloride infusion, , Intravenous, PRN, Annice Needyew, Jason S, MD, Last Rate: 5 mL/hr at 07/17/18 2330 .  acetaminophen (TYLENOL) tablet 650 mg, 650 mg, Oral, Q6H PRN, Annice Needyew, Jason S, MD, 650 mg at 07/16/18 1710 .  albuterol (PROVENTIL) (2.5 MG/3ML) 0.083% nebulizer solution 2.5 mg, 2.5 mg, Inhalation, Q6H PRN, Annice Needyew, Jason S, MD, 2.5 mg at 07/16/18 1609 .  ALPRAZolam (XANAX) tablet 0.5 mg, 0.5 mg, Oral, BID PRN, Annice Needyew, Jason S, MD .  aspirin EC tablet 81 mg, 81 mg, Oral, Daily, Annice Needyew, Jason S, MD, 81 mg at 07/19/18 [id number] .  atorvastatin (LIPITOR) tablet 40 mg, 40 mg, Oral, q1800, Annice Needyew, Jason S, MD, 40 mg at 07/19/18 1656 .  ceFEPIme (MAXIPIME) 1 g in sodium chloride 0.9 % 100 mL IVPB, 1 g, Intravenous, Q12H, Auburn BilberryPatel, Shreyang, MD, Last Rate: 200 mL/hr at 07/19/18 2126, 1 g at 07/19/18 2126 .  ferrous sulfate tablet 325 mg, 325 mg, Oral, BID WC, Annice Needyew, Jason S, MD, 325 mg at 07/20/18 0757 .  furosemide (LASIX) tablet 40 mg, 40 mg, Oral, Daily, Annice Needyew, Jason S, MD, 40 mg at 07/17/18 0907 .  guaiFENesin-dextromethorphan (ROBITUSSIN DM) 100-10 MG/5ML syrup 5 mL, 5 mL, Oral, Q4H PRN, Wyn Quakerew, Marlow BaarsJason S, MD .  heparin ADULT infusion 100 units/mL (25000 units/28950mL sodium chloride 0.45%), 1,150 Units/hr, Intravenous, Continuous, Auburn BilberryPatel, Shreyang, MD, Last Rate: 11.5 mL/hr at 07/20/18 0827, 1,150 Units/hr at 07/20/18 0827 .  HYDROcodone-acetaminophen (NORCO/VICODIN) 5-325 MG per tablet  1-2 tablet, 1-2 tablet, Oral, Q6H PRN, Annice Needyew, Jason S, MD, 1 tablet at 07/20/18 0049 .  insulin aspart (novoLOG) injection 0-15 Units, 0-15 Units, Subcutaneous, TID WC, Annice Needyew, Jason S, MD, 3 Units at 07/19/18 1657 .  insulin aspart (novoLOG) injection 0-5 Units, 0-5 Units, Subcutaneous, QHS, Annice Needyew, Jason S, MD, 3 Units at 07/16/18 2307 .  insulin aspart (novoLOG) injection 3 Units, 3 Units, Subcutaneous, TID WC, Annice Needyew, Jason S, MD, 3 Units at 07/18/18 1250 .  insulin glargine (LANTUS) injection 10 Units, 10 Units, Subcutaneous, QHS, Annice Needyew, Jason S, MD, 10 Units at 07/19/18 2122 .  ipratropium-albuterol (DUONEB) 0.5-2.5 (3) MG/3ML nebulizer solution 3 mL, 3 mL, Nebulization, Once, Dew, Marlow BaarsJason S, MD .  MEDLINE mouth rinse, 15 mL, Mouth Rinse, BID, Dew, Marlow BaarsJason S, MD, 15 mL at 07/18/18 2140 .  metoprolol tartrate (LOPRESSOR) injection 5 mg, 5 mg, Intravenous, Q6H, Dew, Marlow BaarsJason S, MD, 5 mg at 07/19/18 0548 .  midodrine (PROAMATINE) tablet 10 mg, 10 mg, Oral, TID WC, Annice Needyew, Jason S, MD, 10 mg at 07/20/18 0757 .  mometasone-formoterol (DULERA) 200-5 MCG/ACT inhaler 2 puff, 2 puff, Inhalation, BID, Dew, Marlow BaarsJason S, MD, 2 puff at 07/19/18 2123 .  morphine 2 MG/ML injection 2 mg, 2 mg, Intravenous, Q4H PRN, Arbie CookeyBashir, Yoseph Haile, MD, 2 mg at 07/20/18 0758 .  multivitamin-lutein (OCUVITE-LUTEIN) capsule 1 capsule, 1 capsule, Oral, Daily, Dew, Marlow BaarsJason S, MD, 1 capsule at 07/19/18 1300 .  nicotine (NICODERM CQ - dosed in mg/24 hours) patch 14 mg, 14 mg, Transdermal, Daily PRN, Wyn Quakerew, Marlow BaarsJason S, MD .  norepinephrine (LEVOPHED) 4mg  in NS 250mL premix infusion, 0-40 mcg/min, Intravenous, Titrated, Judithe ModestKeene, Jeremiah D, NP, Stopped at 07/18/18 0911 .  ondansetron (ZOFRAN) injection 4 mg, 4 mg, Intravenous, Q6H PRN, Annice Needyew, Jason S, MD, 4 mg at 07/18/18 1034 .  ondansetron (ZOFRAN) injection 4 mg, 4 mg, Intravenous, Q6H PRN, Dew, Marlow BaarsJason S, MD .  pantoprazole (PROTONIX) EC tablet 40 mg, 40 mg, Oral, BID, Dew, Marlow BaarsJason S, MD, 40 mg at  07/19/18 2128 .   polyethylene glycol (MIRALAX / GLYCOLAX) packet 17 g, 17 g, Oral, Daily PRN, Wyn Quakerew, Marlow BaarsJason S, MD .  polyethylene glycol (MIRALAX / GLYCOLAX) packet 17 g, 17 g, Oral, Daily, Annice Needyew, Jason S, MD, 17 g at 07/19/18 0843 .  prochlorperazine (COMPAZINE) injection 5 mg, 5 mg, Intravenous, Q6H PRN, Wyn Quakerew, Marlow BaarsJason S, MD .  protein supplement (ENSURE MAX) liquid, 11 oz, Oral, BID, Dew, Marlow BaarsJason S, MD, 11 oz at 07/19/18 0934 .  tamsulosin (FLOMAX) capsule 0.4 mg, 0.4 mg, Oral, Daily, Dew, Marlow BaarsJason S, MD, 0.4 mg at 07/19/18 [id number] .  tiotropium (SPIRIVA) inhalation capsule ([facility] use ONLY) 18 mcg, 18 mcg, Inhalation, Daily, Annice Needyew, Jason S, MD, 18 mcg at 07/15/18 [phone] .  vancomycin (VANCOCIN) IVPB 750 mg/150 ml premix, 750 mg, Intravenous, Q24H, Hallaji, Sheema M, RPH, Last Rate: 150 mL/hr at 07/19/18 2210, 750 mg at 07/19/18 2210 .  vitamin C (ASCORBIC ACID) tablet 500 mg, 500 mg, Oral, BID, Dew, Marlow BaarsJason S, MD, 500 mg at 07/19/18 2128      Indwelling Urinary Catheter continued, requirement due to   Reason to continue Indwelling Urinary Catheter for strict Intake/Output monitoring for hemodynamic instability   Central Line continued, requirement due to   Reason to continue [employer] Line Monitoring of central venous pressure or other hemodynamic parameters    ASSESSMENT AND PLAN SYNOPSIS   Bilateral infected Diabetic foot wounds, with right foot osteomyelitis Wound cultures + MRSA Meets SIRS Criteria (with new tachycardia and hypotension) Severe Peripheral Vascular Disease -Monitor fever curve -Trend WBC'sand Procalcitonin -contVancomycin. Also on cefepime( for possible pneumonia) -followcultures -Podiatry and Vascular surgery following, appreciate input -S/p debridement of R foot on 1/10 -S/pangioplasty with stent placement of the right superficial femoral arteryon 1/14 -S/pbilateral femoral enterectomies on 1/16 vascular and podiatry to decide about further surgical  options.   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 Hx: PAD, HLD -Cardiac monitoring -Maintain MAP >65 -Continue Midodrine -Levophed if needed to maintain MAP goal -Heparin drip -Cardiology following, Cardiology recommends medical management -Echocardiogram report pending  AKI, cr has normalized Off  IVF Cont po lasix for now -Monitor I&O's / urinary output -Follow BMP -Ensure adequate renal perfusion -Avoid nephrotoxic agents as able -Replace electrolytes as indicated  COPD without acute Exacerbation CXR on 1/15 with questionable RUL infiltrate  Hx: Smoker -Supplemental O2 as needed to maintain O2 sats 88 to 94% -Intermittent CXR and ABG as needed,  -followsputum culture -Continue Dulera and Spiriva -Prn Bronchodilators -Incentive Spirometry. Cont cefepime  Diabetes Mellitus with Hyperglycemia -CBG's -SSI and Lantus -Follow ICU Hypo/hyperglycemia protocol   Anemia 2 units of blood transfused H&h is stable for now Monitor for S/Sx of bleeding Trend CBC Transfuse for Hgb <7  Prn zofran for nausea.  Full code    Arbie CookeyKhalid Idonia Zollinger, MD  07/20/2018 8:55 AM [facility]   cxr suggestive of pulm edema. Will give extra  lasix 20 mg IV.she is on oral lasix also

## 2018-07-20 NOTE — Progress Notes (Signed)
3 Days Post-Op   Subjective/Chief Complaint: HD stable overnight. Remains guarded. Does not complain of pain. Still little communication.   Objective: Vital signs in last 24 hours: Temp:  [97.7 F (36.5 C)-99 F (37.2 C)] 98 F (36.7 C) (01/19 0800) Pulse Rate:  [78-100] 88 (01/19 0800) Resp:  [12-27] 13 (01/19 0900) BP: (95-124)/(49-87) 121/73 (01/19 0900) SpO2:  [97 %-100 %] 97 % (01/19 0800) Weight:  [52.2 kg] 52.2 kg (01/19 0150) Last BM Date: 07/14/18  Intake/Output from previous day: 01/18 0701 - 01/19 0700 In: 502.2 [P.O.:240; I.V.:262.2] Out: 500 [Urine:500] Intake/Output this shift: Total I/O In: 55.7 [I.V.:55.7] Out: 120 [Urine:120]  General appearance: alert and no distress Resp: clear to auscultation bilaterally Cardio: regular rate and rhythm, S1, S2 normal, no murmur, click, rub or gallop Extremities: Right: +PT LEFT: +DP, bilateral groin Prevena VACs in place, no erythema, right heel dressing intact.  Lab Results:  Recent Labs    07/19/18 0440 07/20/18 0518  WBC 12.6* 10.7*  HGB 8.9* 8.9*  HCT 28.9* 28.8*  PLT 186 174   BMET Recent Labs    07/19/18 0104 07/20/18 0518  NA 133* 135  K 5.3* 4.4  CL 107 108  CO2 20* 24  GLUCOSE 131* 92  BUN 33* 31*  CREATININE 0.86 0.76  CALCIUM 7.5* 7.9*   PT/INR No results for input(s): LABPROT, INR in the last 72 hours. ABG No results for input(s): PHART, HCO3 in the last 72 hours.  Invalid input(s): PCO2, PO2  Studies/Results: Mr Ankle Left Wo Contrast  Result Date: 07/18/2018 CLINICAL DATA:  Calcaneal osteomyelitis.  Follow-up. EXAM: MRI OF THE LEFT ANKLE WITHOUT CONTRAST TECHNIQUE: Multiplanar, multisequence MR imaging of the ankle was performed. No intravenous contrast was administered. COMPARISON:  Foot radiographs 07/20/2018. Left forefoot MRI 05/24/2018. FINDINGS: Previous examination included most of the foot, but not the calcaneal tuberosity. TENDONS Peroneal: Intact and normally positioned.  Posteromedial: Intact and normally positioned. Trace fluid in the posterior tibialis tendon sheath. Anterior: Intact and normally positioned. Achilles: Intact.  Trace fluid in the retrocalcaneal bursa. Plantar Fascia: Intact. LIGAMENTS Lateral: The anterior and posterior talofibular and calcaneofibular ligaments are intact. Medial: The deltoid and visualized portions of the spring ligament appear intact. CARTILAGE AND BONES Ankle Joint: No significant ankle joint effusion. The talar dome and tibial plafond are intact. Subtalar Joints/Sinus Tarsi: Unremarkable. Bones: There is a large area of soft tissue ulceration posterior to the calcaneal tuberosity. There is underlying prominent marrow T2 hyperintensity in the calcaneal tuberosity. In addition, there is associated decreased T1 signal, but no definite cortical destruction. The additional bones appear unremarkable. Other: As above, large area of soft tissue ulceration posterior to the calcaneal tuberosity without underlying fluid collection. There is diffuse subcutaneous edema surrounding the ankle and extending into the dorsum of the midfoot and forefoot. No focal fluid collections are seen. 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 IMPRESSION: 1. Large area of soft tissue ulceration over the posterior aspect of the heel with underlying marrow T2 hyperintensity and T1 hypointensity in the calcaneal tuberosity consistent with osteomyelitis. This area was not previously imaged. 2. Generalized cellulitis and muscular T2 hyperintensity. No focal fluid collection. 3. No significant ankle tendon or ligament pathology. Electronically Signed   By: Carey BullocksWilliam  Veazey M.D.   On: 07/18/2018 14:51   Dg Chest Port 1 View  Result Date: 07/19/2018 CLINICAL DATA:  Recent pneumonia EXAM: PORTABLE CHEST 1 VIEW COMPARISON:  January 16, 2019 FINDINGS: Central  catheter tip is in superior vena cava. No pneumothorax. There is a  small pleural effusion on each side with right base atelectasis. There is no airspace consolidation currently. Heart is upper normal in size with pulmonary vascularity normal. There is aortic atherosclerosis. Bones are osteoporotic. IMPRESSION: Central catheter tip in superior vena cava. No pneumothorax. Paragraphs pleural effusions bilaterally with right base atelectasis. No frank consolidation evident currently. Subtle opacity in the right upper lobe seen 1 day prior is no longer appreciable. Stable cardiac silhouette.  There is aortic atherosclerosis. Aortic Atherosclerosis (ICD10-I70.0). Electronically Signed   By: Bretta Bang III M.D.   On: 07/19/2018 08:30    Anti-infectives: Anti-infectives (From admission, onward)   Start     Dose/Rate Route Frequency Ordered Stop   07/18/18 2200  vancomycin (VANCOCIN) IVPB 750 mg/150 ml premix     750 mg 150 mL/hr over 60 Minutes Intravenous Every 24 hours 07/18/18 1141     07/18/18 0400  ceFEPIme (MAXIPIME) 1 g in sodium chloride 0.9 % 100 mL IVPB     1 g 200 mL/hr over 30 Minutes Intravenous Every 12 hours 07/18/18 0358     07/17/18 0500  ceFAZolin (ANCEF) IVPB 2g/100 mL premix    Note to Pharmacy:  Send with pt to OR   2 g 200 mL/hr over 30 Minutes Intravenous On call 07/16/18 1214 07/17/18 1502   07/15/18 1800  vancomycin (VANCOCIN) IVPB 1000 mg/200 mL premix  Status:  Discontinued     1,000 mg 200 mL/hr over 60 Minutes Intravenous Every 24 hours 07/15/18 1232 07/18/18 1141   07/15/18 0500  ceFAZolin (ANCEF) IVPB 2g/100 mL premix    Note to Pharmacy:  Send with patient to specials   2 g 200 mL/hr over 30 Minutes Intravenous On call 07/14/18 1704 07/15/18 1425   07/14/18 2230  ceFAZolin (ANCEF) IVPB 2g/100 mL premix     2 g 200 mL/hr over 30 Minutes Intravenous  Once 07/14/18 2222 07/16/18 0206   07/14/18 1800  vancomycin (VANCOCIN) 1,250 mg in sodium chloride 0.9 % 250 mL IVPB  Status:  Discontinued     1,250 mg 166.7 mL/hr over 90  Minutes Intravenous Every 24 hours 07/14/18 1517 07/15/18 1232   07/11/18 1800  vancomycin (VANCOCIN) IVPB 1000 mg/200 mL premix  Status:  Discontinued     1,000 mg 200 mL/hr over 60 Minutes Intravenous Every 24 hours 07/11/18 1631 07/14/18 1517   07/11/18 1645  piperacillin-tazobactam (ZOSYN) IVPB 3.375 g  Status:  Discontinued     3.375 g 12.5 mL/hr over 240 Minutes Intravenous Every 8 hours 07/11/18 1631 07/13/18 1847   07/10/18 1600  vancomycin (VANCOCIN) IVPB 1000 mg/200 mL premix  Status:  Discontinued     1,000 mg 200 mL/hr over 60 Minutes Intravenous Every 24 hours 07/10/18 1507 07/11/18 1518   07/09/18 2000  piperacillin-tazobactam (ZOSYN) IVPB 3.375 g  Status:  Discontinued     3.375 g 12.5 mL/hr over 240 Minutes Intravenous Every 8 hours 07/09/18 1911 07/11/18 1518   07/09/18 2000  vancomycin (VANCOCIN) IVPB 750 mg/150 ml premix  Status:  Discontinued     750 mg 150 mL/hr over 60 Minutes Intravenous Every 36 hours 07/09/18 1911 07/10/18 1507      Assessment/Plan: s/p Procedure(s): ENDARTERECTOMY FEMORAL - BILATERAL SFA STENT PLACEMENT - LEFT (Bilateral) Continue supportive care. Will access heel wound- per Podiatry for further intervention. Continue Heparin gtt   LOS: 11 days    Eli Hose A 07/20/2018

## 2018-07-21 DIAGNOSIS — R609 Edema, unspecified: Secondary | ICD-10-CM

## 2018-07-21 DIAGNOSIS — Z66 Do not resuscitate: Secondary | ICD-10-CM

## 2018-07-21 DIAGNOSIS — Z7189 Other specified counseling: Secondary | ICD-10-CM

## 2018-07-21 DIAGNOSIS — Z515 Encounter for palliative care: Secondary | ICD-10-CM

## 2018-07-21 DIAGNOSIS — R0602 Shortness of breath: Secondary | ICD-10-CM

## 2018-07-21 LAB — CBC
HCT: 26.8 % — ABNORMAL LOW (ref 36.0–46.0)
Hemoglobin: 8.2 g/dL — ABNORMAL LOW (ref 12.0–15.0)
MCH: 29 pg (ref 26.0–34.0)
MCHC: 30.6 g/dL (ref 30.0–36.0)
MCV: 94.7 fL (ref 80.0–100.0)
Platelets: 190 10*3/uL (ref 150–400)
RBC: 2.83 MIL/uL — AB (ref 3.87–5.11)
RDW: 15.7 % — ABNORMAL HIGH (ref 11.5–15.5)
WBC: 9.6 10*3/uL (ref 4.0–10.5)
nRBC: 0 % (ref 0.0–0.2)

## 2018-07-21 LAB — HEPARIN LEVEL (UNFRACTIONATED): Heparin Unfractionated: 0.5 IU/mL (ref 0.30–0.70)

## 2018-07-21 LAB — GLUCOSE, CAPILLARY
GLUCOSE-CAPILLARY: 165 mg/dL — AB (ref 70–99)
Glucose-Capillary: 136 mg/dL — ABNORMAL HIGH (ref 70–99)
Glucose-Capillary: 65 mg/dL — ABNORMAL LOW (ref 70–99)
Glucose-Capillary: 93 mg/dL (ref 70–99)
Glucose-Capillary: 192 mg/dL — ABNORMAL HIGH (ref 70–99)

## 2018-07-21 LAB — VANCOMYCIN, PEAK: Vancomycin Pk: 29 ug/mL — ABNORMAL LOW (ref 30–40)

## 2018-07-21 LAB — BASIC METABOLIC PANEL
Anion gap: 3 — ABNORMAL LOW (ref 5–15)
BUN: 28 mg/dL — ABNORMAL HIGH (ref 8–23)
CO2: 27 mmol/L (ref 22–32)
Calcium: 7.8 mg/dL — ABNORMAL LOW (ref 8.9–10.3)
Chloride: 106 mmol/L (ref 98–111)
Creatinine, Ser: 0.74 mg/dL (ref 0.44–1.00)
GFR calc Af Amer: >60 mL/min (ref >60)
GFR calc non Af Amer: >60 mL/min (ref >60)
Glucose, Bld: 223 mg/dL — ABNORMAL HIGH (ref 70–99)
Potassium: 4.4 mmol/L (ref 3.5–5.1)
Sodium: 136 mmol/L (ref 135–145)

## 2018-07-21 LAB — VANCOMYCIN, TROUGH: Vancomycin Tr: 15 ug/mL (ref 15–20)

## 2018-07-21 LAB — SURGICAL PATHOLOGY

## 2018-07-21 MED ORDER — SODIUM CHLORIDE 0.9 % IV SOLN
1.0000 g | INTRAVENOUS | Status: AC
  Administered 2018-07-21 – 2018-07-24 (×4): 1 g via INTRAVENOUS
  Filled 2018-07-21: qty 10
  Filled 2018-07-21 (×4): qty 1
  Filled 2018-07-21: qty 10

## 2018-07-21 NOTE — Progress Notes (Signed)
Pt transferring to 2C-Rm 226. [facility].  Contact precaution for lice and MRSA.  Pt A+O X4. NSR. 3L [place]. Carb modified diet. Foley. Wound vacs for right foot, left femoral and right femoral. Doppler for L + R tibial and pedal pulses. Amputation possibilities planned for left foot or left lower extremity. Heparin drips at 1100U/hr.

## 2018-07-21 NOTE — Progress Notes (Signed)
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  Allergies  Allergen Reactions  . Eggs Or Egg-Derived Products Diarrhea, Itching and Nausea And Vomiting    Patient Measurements: Height: 5\' 2"  (157.5 cm) Weight: 115 lb 1.3 oz (52.2 kg) IBW/kg (Calculated) : 50.1 Heparin Dosing Weight: 36.6 kg - reported by Jacqulyn Cane RN  Vital Signs: Temp: 98.6 F (37 C) (01/20 0015) Temp Source: Oral (01/20 0015) BP: 102/61 (01/20 0400) Pulse Rate: 81 (01/20 0400)  Labs: Recent Labs    07/19/18 0104 07/19/18 0440  07/20/18 0518 07/20/18 1308 07/20/18 1940 07/21/18 0214  HGB  --  8.9*  --  8.9*  --   --  8.2*  HCT  --  28.9*  --  28.8*  --   --  26.8*  PLT  --  186  --  174  --   --  190  HEPARINUNFRC 0.44  --    < > 0.84* 0.70 0.54 0.50  CREATININE 0.86  --   --  0.76  --   --  0.74   < > = values in this interval not displayed.    Estimated Creatinine Clearance: 56.9 mL/min (by C-G formula based on SCr of 0.74 mg/dL).   Assessment: Pharmacy was consulted for heparin.  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 Will follow aPTT.   Goal of Therapy:  APTT level 66-102 seconds or Heparin level 0.3-0.7 units/ml once heparin level correlates with aPTT  Monitor platelets by anticoagulation protocol: Yes  Heparin Course: 1/13 AM heparin level 0.24. 600 unit  bolus and increase rate t o 850 units/hr. Recheck in 6 hours.  1/13:  HL @ 2055 = 0.17 .   Heparin was not held during any point on 1/13, angiogram was not done.   Will order heparin 1750 units IV X 1 once and increase drip rate to 1050 units/hr.  Will recheck HL 6 hrs after rate change.  1/14 Patient had angiogram with heparin on hold. 01/17 @ 0200 HL 0.41 therapeutic. Will continue current rate and will recheck HL @ 0800, Hgb now below 7 @ 6.5 will continue to  monitor.  01/17 @ 0500 patient's heparin drip to be held for central line placement will retime HL for 1200. 01/18 @ 0100 HL 0.44 therapeutic. Will continue current rate and will recheck HL @ 0700. Patient is s/p 2 transfusions w/ a hgb of 9.5 mg/dL will continue to monitor. 01/19 @ 0500 HL 0.84 supratherapeutic. Will decrease rate to 1150 units/hr and will recheck @ 1300, will continue monitoring CBC.  Plan:  1/20 0200 HL= 0.50. Will continue current rate of heparin 1100 units/hr and f/u w/ am labs. CBC stable will continue to monitor.  Thomasene Ripple, PharmD Clinical Pharmacist 07/21/2018,4:28 AM

## 2018-07-21 NOTE — Progress Notes (Signed)
Physical Therapy Treatment Patient Details Name: April KillingsDoris J Hey MRN: [id number] DOB: January 04, 1956 Today's Date: 07/21/2018    History of Present Illness Pt is a 63 y.o. female presenting to hospital 07/09/18 (direct podiatry office admit).  Pt with R foot infection and L heel ulceration (possible osteomyelitis R foot).  S/p excisional debridement R foot with wound VAC placement 07/11/18.  S/p angio 07/15/18.  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    PT Comments    Pt still reporting high pain level and appearing anxious but agreeable to try to work with therapy.  She is very HOH and pt's granddaughter was in room to help with communication.  Pt able to perform there ex independently but very limited in moving through full ROM without report of pain increase.  Pt also remained slightly tachycardic in supine with minimal movement.  She did agree to sit up in longsitting briefly to "get her back off the bed" but stated that she could not get to EOB.  Pt will continue to benefit from skilled PT with focus on strength, tolerance to activity, safe functional mobility and pain management.   Follow Up Recommendations  SNF     Equipment Recommendations  Wheelchair (measurements PT);Wheelchair cushion (measurements PT);3in1 (PT)    Recommendations for Other Services       Precautions / Restrictions Restrictions Weight Bearing Restrictions: Yes RLE Weight Bearing: Non weight bearing LLE Weight Bearing: Non weight bearing    Mobility  Bed Mobility Overal bed mobility: Modified Independent             General bed mobility comments: Pt able to sit up in longsitting without PT assist.  Refused to sit at EOB and HR elevated to 113 BPM+  Transfers                    Ambulation/Gait                 Stairs             Wheelchair Mobility    Modified Rankin (Stroke Patients Only)       Balance Overall balance assessment: (Deferred)                                           Cognition Arousal/Alertness: Awake/alert Behavior During Therapy: Restless Overall Cognitive Status: Within Functional Limits for tasks assessed                                        Exercises Total Joint Exercises Short Arc Quad: Strengthening;Right;Left;10 reps;Supine Hip ABduction/ADduction: Strengthening;Both;10 reps;Supine Straight Leg Raises: Strengthening;Both;Supine;10 reps    General Comments        Pertinent Vitals/Pain Pain Assessment: Faces Faces Pain Scale: Hurts even more Pain Location: BLE's; pt's granddaughter in room and stated that pt has said pain is mostly in feet and inner thighs Pain Descriptors / Indicators: Aching;Grimacing;Guarding Pain Intervention(s): Limited activity within patient's tolerance    Home Living                      Prior Function            PT Goals (current goals can now be found in the care plan section) Acute Rehab PT  Goals Patient Stated Goal: to have less foot pain PT Goal Formulation: With patient Time For Goal Achievement: 07/30/18 Potential to Achieve Goals: Fair Progress towards PT goals: Progressing toward goals    Frequency    Min 2X/week      PT Plan Current plan remains appropriate    Co-evaluation              AM-PAC PT "6 Clicks" Mobility   Outcome Measure  Help needed turning from your back to your side while in a flat bed without using bedrails?: A Lot Help needed moving from lying on your back to sitting on the side of a flat bed without using bedrails?: A Lot Help needed moving to and from a bed to a chair (including a wheelchair)?: Total Help needed standing up from a chair using your arms (e.g., wheelchair or bedside chair)?: Total Help needed to walk in hospital room?: Total Help needed climbing 3-5 steps with a railing? : Total 6 Click Score: 8    End of Session Equipment Utilized During Treatment:  Oxygen Activity Tolerance: Patient limited by fatigue;Patient limited by pain Patient left: in bed;with call bell/phone within reach;with bed alarm set;with nursing/sitter in room;with family/visitor present Nurse Communication: Mobility status(Pt HR) PT Visit Diagnosis: Other abnormalities of gait and mobility (R26.89);Muscle weakness (generalized) (M62.81);Pain Pain - Right/Left: Right(Left) Pain - part of body: Ankle and joints of foot     Time: 9476-5465 PT Time Calculation (min) (ACUTE ONLY): 19 min  Charges:  $Therapeutic Exercise: 8-22 mins                     Glenetta Hew, PT, DPT    Glenetta Hew 07/21/2018, 3:17 PM

## 2018-07-21 NOTE — Progress Notes (Addendum)
Nutrition Follow-up  DOCUMENTATION CODES:   Severe malnutrition in context of chronic illness  INTERVENTION:  Continue Ensure Max protein supplement BID, each supplement provides 150kcal and 30g of protein.  Continue Ocuvite daily for wound healing (provides zinc, vitamin A, vitamin C, Vitamin E, copper, and selenium).  Continue vitamin C 500 mg po BID.  Consider increasing bowel regimen as patient has not had a BM since 1/13.  NUTRITION DIAGNOSIS:   Severe Malnutrition related to chronic illness(COPD, DM) as evidenced by severe fat depletion, severe muscle depletion.  Ongoing.  GOAL:   Patient will meet greater than or equal to 90% of their needs  Progressing.  MONITOR:   PO intake, Supplement acceptance, Weight trends, Labs, I & O's, Skin  REASON FOR ASSESSMENT:   Malnutrition Screening Tool    ASSESSMENT:    63 y.o. 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   -Underwent angioplasty and stent placement in right superficial femoral artery on 1/14. -Rapid response was called on 1/15 for tachypnea and tachycardia. -Underwent endarterectomies of left common femoral, profunda femoris, and superficial femoral artery and right common femoral and profunda femoris, and angiogram of LLE on 1/16.  Patient's appetite has improved some yesterday and today. She is eating about 50% of her meals now. Continues to drink Ensure Max supplements.   Meal Completion: 50-60%  Medications reviewed and include: ferrous sulfate 325 mg BID, Lasix 40 mg daily, Novolog 0-15 units TID, Novolog 0-5 units QHS, Novolog 3 units TID, Lantus 10 units QHS, Ocuvite, pantoprazole, Miralax, Flomax, vitamin C 500 mg BID, heparin gtt, vancomycin.  Labs reviewed: CBG 136-165, BUN 28, Anion gap 3.  Discussed with RN and on  rounds.  Diet Order:   Diet Order            Diet Carb Modified Fluid consistency: Thin; Room service appropriate? Yes  Diet effective now             EDUCATION NEEDS:   Education needs have been addressed  Skin:  Skin Assessment: Reviewed RN Assessment(R foot wound 6 x 2 cm, L heel wound 4 cm )  Last BM:  07/14/2018 - large type 4  Height:   Ht Readings from Last 1 Encounters:  07/16/18 5\' 2"  (1.575 m)   Weight:   Wt Readings from Last 1 Encounters:  07/21/18 49.4 kg   Ideal Body Weight:  50 kg  BMI:  Body mass index is 19.92 kg/m.  Estimated Nutritional Needs:   Kcal:  1300-1500kcal/day   Protein:  68-77g/day   Fluid:  >1.3L/day   Helane Rima, MS, RD, LDN Office: [phone] Pager: [phone] After Hours/Weekend Pager: [phone]

## 2018-07-21 NOTE — Progress Notes (Signed)
[facility] [place] at [facility]   PATIENT NAME: April [name]    MR#:  [id number]  DATE OF BIRTH:  03/24/1956  SUBJECTIVE:  CHIEF COMPLAINT:  Patient awaiting bed for transfer to the floor,   REVIEW OF SYSTEMS:  CONSTITUTIONAL: No fever, fatigue or weakness.  EYES: No blurred or double vision.  EARS, NOSE, AND THROAT: No tinnitus or ear pain.  RESPIRATORY: No cough, shortness of breath, wheezing or hemoptysis.  CARDIOVASCULAR: No chest pain, orthopnea, edema.  GASTROINTESTINAL: No nausea, vomiting, diarrhea or abdominal pain.  GENITOURINARY: No dysuria, hematuria.  ENDOCRINE: No polyuria, nocturia,  HEMATOLOGY: No anemia, easy bruising or bleeding SKIN: Right foot in dressing and has left heel ulcer MUSCULOSKELETAL: No joint pain or arthritis.   NEUROLOGIC: No tingling, numbness, weakness.  PSYCHIATRY: No anxiety or depression.   DRUG ALLERGIES:   Allergies  Allergen Reactions  . Eggs Or Egg-Derived Products Diarrhea, Itching and Nausea And Vomiting    VITALS:  Blood pressure (!) 117/59, pulse 96, temperature 98.2 F (36.8 C), temperature source Oral, resp. rate 16, height 5\' 2"  (1.575 m), weight 49.4 kg, SpO2 97 %.  PHYSICAL EXAMINATION:  GENERAL:  63 y.o.-year-old patient lying in the bed with no acute distress.  EYES: Pupils equal, round, reactive to light and accommodation. No scleral icterus. Extraocular muscles intact.  HEENT: Head atraumatic, normocephalic. Oropharynx and nasopharynx clear.  NECK:  Supple, no jugular venous distention. No thyroid enlargement, no tenderness.  LUNGS: Normal breath sounds bilaterally, no wheezing, rales,rhonchi or crepitation. No use of accessory muscles of respiration.  CARDIOVASCULAR: S1, S2 normal. No murmurs, rubs, or gallops.  ABDOMEN: Soft, nontender, nondistended. Bowel sounds present.  EXTREMITIES: No pedal edema, cyanosis, or clubbing.  NEUROLOGIC: Awake, alert and oriented x3 sensation intact.  Gait not checked.  PSYCHIATRIC: The patient is alert and oriented x 3.  SKIN: Right foot with wound VAC left heel ulcer with necrosis  LABORATORY PANEL:   CBC Recent Labs  Lab 07/21/18 0214  WBC 9.6  HGB 8.2*  HCT 26.8*  PLT 190   ------------------------------------------------------------------------------------------------------------------  Chemistries  Recent Labs  Lab 07/17/18 0526  07/21/18 0214  NA 134*   < > 136  K 4.7   < > 4.4  CL 99   < > 106  CO2 25   < > 27  GLUCOSE 260*   < > 223*  BUN 30*   < > 28*  CREATININE 1.04*   < > 0.74  CALCIUM 8.3*   < > 7.8*  MG 2.1  --   --    < > = values in this interval not displayed.   ------------------------------------------------------------------------------------------------------------------  Cardiac Enzymes Recent Labs  Lab 07/18/18 0210  TROPONINI 0.40*   ------------------------------------------------------------------------------------------------------------------  RADIOLOGY:  Dg Chest 1 View  Result Date: 07/20/2018 CLINICAL DATA:  Pitting edema.  History of pneumonia. EXAM: CHEST  1 VIEW COMPARISON:  07/19/2018 FINDINGS: Stable position of the right jugular central line with the tip in the SVC region. Heart size is within normal limits and stable. Hazy interstitial densities in the lower lungs are similar to the recent comparison examination. Question slightly increased densities in the right perihilar region. Negative for a pneumothorax. IMPRESSION: Slightly increased densities in the right perihilar region are nonspecific could be related to overlying structures. Difficult to exclude slightly increased interstitial edema. Persistent patchy and hazy densities at the lung bases are suggestive for pleural fluid and airspace disease/atelectasis. Stable position of the central line. Electronically  Signed   By: Richarda Overlie M.D.   On: 07/20/2018 12:42    EKG:   Orders placed or performed during the hospital  encounter of 07/09/18  . EKG 12-Lead  . EKG 12-Lead  . EKG 12-Lead  . EKG 12-Lead  . EKG 12-Lead  . EKG 12-Lead    ASSESSMENT AND PLAN:     #1 bilateral infected diabetic foot wounds-  right foot osteomyelitis  Status post evaluation by podiatry they will discuss with vascular surgery in terms of further option As well as vascular surgery Patient with severe peripheral vascular disease status post intervention Continue cefepime  #2  Hypotension off Levophed  #3 non-ST MI patient not candidate for any type of invasive cardiac procedure according to cardiology due to anemia and other multiple issues going on Plan to medically manage   #4 recurrent left lower extremity DVT-on Xarelto at home. -Continue heparin  #5 COPD-stable, no signs of acute exacerbation -Continue home inhalers  #6 uncontrolled type 2 diabetes- recent A1c 10.9. -Continue Lantus adjusted as needed  #7 hyperlipidemia-stable -Continue home statin  #8 anxiety-stable -Continue home Xanax  #9 severe protein-calorie malnutrition -Continue home Premier protein twice daily between meals  #10 head lice-that is post treatment permethrin  #11 tobacco abuse -Nicotine patch as needed  CODE STATUS: fc  TOTAL TIME TAKING CARE OF THIS PATIENT: 33 minutes.  Disposition pending current intervention.  Note: This dictation was prepared with Dragon dictation along with smaller phrase technology. Any transcriptional errors that result from this process are unintentional.   Auburn Bilberry M.D on 07/21/2018 at 11:59 AM  Between 7am to 6pm - Pager - [phone] After 6pm go to [email or web address] - password [email or web address] [facility]  [facility]  Office  [phone]  CC: Primary care physician; Mickel Fuchs, MD

## 2018-07-21 NOTE — Consult Note (Signed)
Consultation Note Date: 07/21/2018   Patient Name: April [name]  DOB: Aug 20, 1955  MRN: [id number]  Age / Sex: 63 y.o., female  PCP: Elba Barman, MD Referring Physician: Dustin Flock, MD  Reason for Consultation: Establishing goals of care  HPI/Patient Profile: 63 y.o. female admitted on 07/09/2018 from her [place] office with complaints bilateral infected foot wounds. She has a past medical history of COPD, tobacco use, type 2 diabetes, recurrent left lower DVT (Xarelto), hyperlipidemia, peripheral artery disease, and anxiety. On admission patient complained of bilateral lower extremity pain. Denied fever, chills. Endorsed right foot wound drainage. There are open wounds to the plantar surface of her right foot and left foot heel. Vascular surgery consulted. Patient underwent bilateral femoral endarterectomies on 07/17/18.  Patient developed shortness of breath, lethargy, and tachycardia. She was evaluated by cardiology. Given elevated troponin and symptoms patient was found to have non-STEMI. Cardiology recommendation for medical management. Palliative Medicine team consulted for goals of care discussion.   Clinical Assessment and Goals of Care: I have reviewed medical records including lab results, imaging, Epic notes, and MAR, received report from the bedside RN, and assessed the patient. I met at the bedside with patient, her daughter, Cyndi Lennert, granddaughter, Janice Coffin, and friend, Nilda Riggs to discuss diagnosis prognosis, [place], EOL wishes, disposition and options. Patient A&O x3. She is extremely hard of hearing. Does hear better on right side.   I introduced Palliative Medicine as specialized medical care for people living with serious illness. It focuses on providing relief from the symptoms and stress of a serious illness. The goal is to improve quality of life for both the patient  and the family.  Patient and family states patient lived with her friend Nila Nephew prior to admission. Daughter and granddaughter lives around the corner, minutes away. Patient expressed she enjoys spending time with family and friends. 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   As far as functional and nutritional status prior to admission patient's friend states she had a decent appetite and would often eat junk food. Daughter states patient loves chips. Family states patient was able to perform ADLs with assistance. She required total care in transferring due to bilateral foot ulcerations. Granddaughter does express that patient's health has continued to decline more rapidly since November. She states patient would often be tired, sleep more, and had less of an appetite. Family endorses weight loss but unable to confirm amount.   We discussed her current illness and what it means in the larger context of her on-going co-morbidities. I discussed specifically patient's cardiac condition, respiratory state, bilateral foot wounds, and her overall condition. Natural disease trajectory and expectations at EOL were discussed. Family tearful and expressed they were not aware of her episodes of tachycardia and potential Non-STEMI. Support given. Family verbalized understanding of all updates.   Podiatry and Vascular surgeon team at bedside and provided updates also.   I attempted to elicit values and goals of care  important to the patient.    The difference between aggressive medical intervention and comfort care was considered in light of the patient's goals of care. Family expressed they remained hopeful patient would show signs of improvement and wished to continue with all treatments allowing her every opportunity for improvement. Family and patient verbalized their wishes to proceed with BKA to allow her the opportunity to improve.   We discussed in  detail, risk of surgery and the long recovery patient would face after surgery. Family aware that they could continue with current treatment options as well. They verbalized awareness of options and wish to proceed with surgical intervention/BKA. Patient agreed. She expressed "If I die at least we tried!" family agreed.   Advanced directives, concepts specific to code status, artifical feeding and hydration, and rehospitalization were considered and discussed. I discussed patient's partial code status in detail with consideration of her current condition and co-morbidities. Daughter and granddaughter verbalized understanding. They expressed wishes for patient to be DNR/DNI. Patient verbalized agreement. All family, patient, and her friend were in mutual agreement of DNR/DNI. I explained that after I updated status in computer, bedside RN would place purple DNR bracelet on patient to identify to medical team patient's wishes. Family and patient verbalized understanding.   Hospice and Palliative Care services outpatient were explained and offered. Given family would like to proceed with BKA with awareness of recovery phase, patient would be most appropriate for outpatient palliative. However, I discussed with family, I would continue to support and follow for any changes.  Patient and family verbalized their understanding and awareness of outpatient palliative goals and philosophy of care. Family requesting outpatient palliative support at discharge.   Questions and concerns were addressed.  Hard Choices booklet left for review. The family was encouraged to call with questions or concerns.  PMT will continue to support holistically.  Primary Decision Maker:  NEXT OF KIN   I was somewhat concerned with daughter's interaction during goals of care. She had several moments she seemed drowsy or somewhat "intoxicated/high" she was alert and engaged in conversation however, she had 2 moments she dozed off, or was  talking and seemed under the influence/drowsy and almost fell out of the chair, caught herself and began laughing. Daughter looked at her with concern and once yelled "mom really". Patient just looked at daughter and looked away to friend. No obvious smell of alcohol. I did walk out with her and she had noticeable stagger at times. However, alert and appropriate with responses during conversation. Granddaughter, however, drove most of the questions and discussion.   SUMMARY OF RECOMMENDATIONS    DNR/DNI-as requested by patient and family (daughter)  Continue to treat the treatable  Family states they wish to proceed with BKA to allow patient a fighting chance. Understand risk of cardiac event, recovery and healing process, and possibility of further complications (infection).   Requesting outpatient Palliative for support at discharge.   Palliative Medicine team will continue to support and follow.   Code Status/Advance Care Planning:  DNR   Symptom Management:   Per attending   Palliative Prophylaxis:   Aspiration, Bowel Regimen, Delirium Protocol, Frequent Pain Assessment, Oral Care, Palliative Wound Care and Turn Reposition  Additional Recommendations (Limitations, Scope, Preferences):  Full Scope Treatment  Psycho-social/Spiritual:   Desire for further Chaplaincy support:NO  Additional Recommendations: Caregiving  Support/Resources and Education on Hospice  Prognosis:   Guarded to Poor- in the setting of bilateral diabetic foot ulcerations, osteomyelitis, PVD, severe PAD bilateral  s/p left and right endarterectomies, partial resection of right SFA stent with angiogram, hypotension, NSTEMI with medical management only, tobacco use, diabetes, urinary retention, COPD, decreased mobility, poor po intake, and DVT.   Discharge Planning: To Be Determined      Primary Diagnoses: Present on Admission: . Diabetic foot infection ([place])   I have reviewed the medical record,  interviewed the patient and family, and examined the patient. The following aspects are pertinent.  Past Medical History:  Diagnosis Date  . Anxiety   . Colon polyps   . COPD (chronic obstructive pulmonary disease) ([place])   . Diabetes mellitus without complication ([place])   . DVT (deep venous thrombosis) ([place])   . DVT (deep venous thrombosis) ([place])   . History of colonic polyps   . Hyperlipidemia   . Peripheral artery disease ([place])   . Pulmonary nodules/lesions, multiple 08/2012   NEGATIVE PET SCAN  . 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 Tobacco abuse    Social History   Socioeconomic History  . Marital status: Divorced    Spouse name: Not on file  . Number of children: Not on file  . Years of education: Not on file  . Highest education level: Not on file  Occupational History  . Not on file  Social Needs  . Financial resource strain: Not on file  . Food insecurity:    Worry: Not on file    Inability: Not on file  . Transportation needs:    Medical: Not on file    Non-medical: Not on file  Tobacco Use  . Smoking status: Current Every Day Smoker    Packs/day: 1.50    Years: 47.00    Pack years: 70.50    Types: Cigarettes  . Smokeless tobacco: Never Used  . Tobacco comment: Smoking History 2.5-started at age 24  Substance and Sexual Activity  . Alcohol use: No    Alcohol/week: 0.0 standard drinks  . Drug use: No  . Sexual activity: Not on file  Lifestyle  . Physical activity:    Days per week: Not on file    Minutes per session: Not on file  . Stress: Not on file  Relationships  . Social connections:    Talks on phone: Not on file    Gets together: Not on file    Attends religious service: Not on file    Active member of club or organization: Not on file    Attends meetings of clubs or organizations: Not on file    Relationship status: Not on file  Other Topics Concern  . Not on file  Social History Narrative  . Not on file   Family  History  Problem Relation Age of Onset  . Brain cancer Mother    Scheduled Meds: . aspirin EC  81 mg Oral Daily  . atorvastatin  40 mg Oral q1800  . ferrous sulfate  325 mg Oral BID WC  . furosemide  40 mg Oral Daily  . insulin aspart  0-15 Units Subcutaneous TID WC  . insulin aspart  0-5 Units Subcutaneous QHS  . insulin aspart  3 Units Subcutaneous TID WC  . insulin glargine  10 Units Subcutaneous QHS  . mouth rinse  15 mL Mouth Rinse BID  . metoprolol tartrate  5 mg Intravenous Q6H  . midodrine  10 mg Oral TID WC  . mometasone-formoterol  2 puff Inhalation BID  . multivitamin-lutein  1 capsule Oral Daily  .  pantoprazole  40 mg Oral BID  . polyethylene glycol  17 g Oral Daily  . ENSURE MAX PROTEIN  11 oz Oral BID  . tamsulosin  0.4 mg Oral Daily  . tiotropium  18 mcg Inhalation Daily  . ascorbic acid  500 mg Oral BID   Continuous Infusions: . sodium chloride 250 mL (07/20/18 2220)  . cefTRIAXone (ROCEPHIN)  IV    . heparin 1,100 Units/hr (07/21/18 1000)  . vancomycin 750 mg (07/20/18 2352)   PRN Meds:.sodium chloride, acetaminophen, albuterol, ALPRAZolam, guaiFENesin-dextromethorphan, HYDROcodone-acetaminophen, morphine injection, nicotine, ondansetron (ZOFRAN) IV, ondansetron (ZOFRAN) IV, polyethylene glycol, prochlorperazine Medications Prior to Admission:  Prior to Admission medications   Medication Sig Start Date End Date Taking? Authorizing Provider  albuterol (PROVENTIL HFA;VENTOLIN HFA) 108 (90 Base) MCG/ACT inhaler Inhale 2 puffs into the lungs every 6 (six) hours as needed for wheezing or shortness of breath.    [provider]  albuterol (PROVENTIL) (2.5 MG/3ML) 0.083% nebulizer solution Inhale 2.5 mg into the lungs every 6 (six) hours as needed for wheezing or shortness of breath.     [provider]  ALPRAZolam Duanne Moron) 0.5 MG tablet Take 1 tablet (0.5 mg total) by mouth 2 (two) times daily as needed for anxiety. 06/13/18   Vaughan Basta,  MD  aspirin 81 MG chewable tablet Chew 81 mg by mouth daily. 05/30/17   [provider]  atorvastatin (LIPITOR) 40 MG tablet Take 40 mg by mouth daily. 10/24/17 10/24/18  [provider]  ferrous sulfate 325 (65 FE) MG tablet Take 1 tablet (325 mg total) by mouth 2 (two) times daily with a meal. 06/01/18 07/01/18  Pyreddy, Reatha Harps, MD  Fluticasone-Salmeterol (ADVAIR DISKUS) 250-50 MCG/DOSE AEPB Inhale 1 puff into the lungs 2 (two) times daily. 03/14/15 01/16/26  Beers, Pierce Crane, PA-C  furosemide (LASIX) 40 MG tablet Take 1 tablet (40 mg total) by mouth daily. 06/14/18   Vaughan Basta, MD  guaiFENesin-dextromethorphan (ROBITUSSIN DM) 100-10 MG/5ML syrup Take 5 mLs by mouth every 4 (four) hours as needed for cough. 04/11/18   Hillary Bow, MD  HYDROcodone-acetaminophen (NORCO/VICODIN) 5-325 MG tablet Take 1-2 tablets by mouth every 6 (six) hours as needed for moderate pain or severe pain. 06/01/18   Saundra Shelling, MD  insulin glargine (LANTUS) 100 unit/mL SOPN Inject 0.1 mLs (10 Units total) into the skin daily. 06/13/18   Vaughan Basta, MD  metFORMIN (GLUCOPHAGE) 500 MG tablet Take 1 tablet (500 mg total) by mouth 2 (two) times daily with a meal. 06/13/18   Vaughan Basta, MD  pantoprazole (PROTONIX) 40 MG tablet Take 40 mg by mouth 2 (two) times daily.  04/25/18   [provider]  polyethylene glycol (MIRALAX / GLYCOLAX) packet Take 17 g by mouth daily. 06/14/18   Vaughan Basta, MD  potassium chloride (K-DUR,KLOR-CON) 10 MEQ tablet Take 1 tablet (10 mEq total) by mouth daily. 06/13/18   Vaughan Basta, MD  protein supplement shake (PREMIER PROTEIN) LIQD Take 325 mLs (11 oz total) by mouth 2 (two) times daily between meals. 06/14/18   Vaughan Basta, MD  rivaroxaban (XARELTO) 10 MG TABS tablet Take 10 mg by mouth every evening.    [provider]  SANTYL ointment Apply 1 application topically daily.  05/19/18   [provider]  sodium hypochlorite (DAKIN'S 1/4 STRENGTH) 0.125 % SOLN Apply 1 application topically daily. Use as a wound cleanser, pat dry, then apply dressings 12/18/17   [provider]  SPIRIVA HANDIHALER 18 MCG inhalation capsule Place  18 mcg into inhaler and inhale daily.  05/19/18   [provider]  tamsulosin (FLOMAX) 0.4 MG CAPS capsule Take 1 capsule (0.4 mg total) by mouth daily. 06/14/18   Vaughan Basta, MD  vitamin C (VITAMIN C) 500 MG tablet Take 1 tablet (500 mg total) by mouth 2 (two) times daily. 06/13/18   Vaughan Basta, MD   Allergies  Allergen Reactions  . Eggs Or Egg-Derived Products Diarrhea, Itching and Nausea And Vomiting   Review of Systems  Constitutional: Positive for activity change, appetite change and fatigue.  Respiratory: Positive for shortness of breath.   Musculoskeletal: Positive for arthralgias.  Skin: Positive for wound.  Neurological: Positive for weakness.  All other systems reviewed and are negative.   Physical Exam Vitals signs and nursing note reviewed.  Constitutional:      Appearance: She is cachectic. She is ill-appearing.     Comments: Thin, frail appearing   Cardiovascular:     Rate and Rhythm: Regular rhythm. Tachycardia present.     Pulses: Decreased pulses.     Heart sounds: Normal heart sounds.  Pulmonary:     Effort: Pulmonary effort is normal.     Breath sounds: Decreased breath sounds present.     Comments: 3L/[place]  Abdominal:     General: Abdomen is flat. Bowel sounds are normal.  Feet:     Comments: foot wound vac, dressing intact  Skin:    General: Skin is warm and dry.     Findings: Bruising present.     Comments: Bilateral groin wound vac, left heel and right heel dressing  Neurological:     Mental Status: She is alert and oriented to person, place, and time.  Psychiatric:        Attention and Perception: Attention normal.        Mood and Affect: Mood normal.        Speech:  Speech normal.        Behavior: Behavior is cooperative.     Comments: Hard of hearing      Vital Signs: BP 119/72 (BP Location: Right Arm)   Pulse (!) 113   Temp 98.1 F (36.7 C) (Oral)   Resp 14   Ht '5\' 2"'$  (1.575 m)   Wt 49.4 kg   SpO2 99%   BMI 19.92 kg/m  Pain Scale: 0-10 POSS *See Group Information*: 1-Acceptable,Awake and alert Pain Score: 8    SpO2: SpO2: 99 % O2 Device:SpO2: 99 % O2 Flow Rate: .O2 Flow Rate (L/min): 3 L/min  IO: Intake/output summary:   Intake/Output Summary (Last 24 hours) at 07/21/2018 1543 Last data filed at 07/21/2018 1352 Gross per 24 hour  Intake 721.57 ml  Output 2900 ml  Net -2178.43 ml    LBM: Last BM Date: 07/14/18 Baseline Weight: Weight: 47.8 kg Most recent weight: Weight: 49.4 kg     Palliative Assessment/Data: PPS 30%   Flowsheet Rows     Most Recent Value  Intake Tab  Referral Department  Critical care  Unit at Time of Referral  ICU  Date Notified  07/18/18  Palliative Care Type  New Palliative care  Reason for referral  Clarify Goals of Care  Date of Admission  07/09/18  # of days IP prior to Palliative referral  9  Clinical Assessment  Psychosocial & Spiritual Assessment  Palliative Care Outcomes      Time In: 1130 Time Out: 1245 Time Total:75 min  Greater than 50%  of this time was spent counseling  and coordinating care related to the above assessment and plan.  Signed by:  Alda Lea, AGPCNP-BC Palliative Medicine Team  Phone: [phone] Fax: [phone] Pager: [phone] Amion: Bjorn Pippin    Please contact Palliative Medicine Team phone at [phone] for questions and concerns.  For individual provider: See Shea Evans

## 2018-07-21 NOTE — Progress Notes (Addendum)
[facility] Daily Progress Note   Subjective: 4 Day Post-Op: Left common femoral, profunda femoris, and superficial femoral artery endarterectomies, Right common femoralandprofunda femoris endarterectomies, Partial resection of right SFA stent with Angiogram of the left lower extremity through a right femoral micropuncture sheath  6 Day Post-Op: Introduction catheter intorightlower extremity 3rd order catheter placement antegrade approach, Introduction catheter into aorta left common femoral retrograde approach,  Percutaneous transluminal angioplastyand stent placement rightsuperficial femoral artery, Minxclosure rightcommon femoral arteriotomy  Patient with continued bilateral lower extremity discomfort. Multiple family members at beside.   Objective: Vitals:   07/21/18 1000 07/21/18 1100 07/21/18 1200 07/21/18 1300  BP: 111/61 (!) 117/59 124/65 116/68  Pulse: 88 96 93 95  Resp: 15 16 16 18   Temp:   98.7 F (37.1 C)   TempSrc:   Oral   SpO2: 98% 97% 97% 98%  Weight:      Height:        Intake/Output Summary (Last 24 hours) at 07/21/2018 1334 Last data filed at 07/21/2018 1309 Gross per 24 hour  Intake 744.53 ml  Output 2575 ml  Net -1830.47 ml   Physical Exam: A&Ox3, NAD CV: RRR Pulmonary: CTA Bilaterally Abdomen: Soft, Nontender, Nondistended Vascular:  Right Extremity: Groin - VAC intact and to suction. No signs of drainage or swelling. No cellulitis. Extremity warm to toes. Podiatry dressing on foot.  Left Extremity: Groin - VAC intact and to suction. No signs of drainage or swelling. No cellulitis. Extremity warm to toes. Podiatry dressing on foot.   Laboratory: CBC    Component Value Date/Time   WBC 9.6 07/21/2018 0214   HGB 8.2 (L) 07/21/2018 0214   HGB 13.6 10/14/2014 0644   HCT 26.8 (L) 07/21/2018 0214   HCT 41.3 10/14/2014 0644   PLT 190 07/21/2018 0214   PLT 156 10/14/2014 0644   BMET    Component Value Date/Time   NA 136  07/21/2018 0214   NA 142 10/14/2014 0644   K 4.4 07/21/2018 0214   K 3.6 10/14/2014 0644   CL 106 07/21/2018 0214   CL 109 10/14/2014 0644   CO2 27 07/21/2018 0214   CO2 29 10/14/2014 0644   GLUCOSE 223 (H) 07/21/2018 0214   GLUCOSE 142 (H) 10/14/2014 0644   BUN 28 (H) 07/21/2018 0214   BUN 9 10/14/2014 0644   CREATININE 0.74 07/21/2018 0214   CREATININE 0.53 10/14/2014 0644   CALCIUM 7.8 (L) 07/21/2018 0214   CALCIUM 7.7 (L) 10/14/2014 0644   GFRNONAA >60 07/21/2018 0214   GFRNONAA >60 10/14/2014 0644   GFRAA >60 07/21/2018 0214   GFRAA >60 10/14/2014 0644   Assessment/Planning: 63 year old female with severe PAD bilaterally s/p endovascular and open intervention  1) Patient with osteomyelitis to left heel - would require extensive surgical intervention by podiatry and patient may not be able to ambulate after. Had long discussion with patient and family in regard to left BKA which would remove non-viable tissue at a level in which we have confirmed adequate blood flow to heal. BKA would also allow patient to possibly ambulate with prosthetic in future. Patient and family would like to move forward with BKA. They understand this is planned for Thursday with Dr. Wyn Quaker.  2) Palliative care is also following at this time.  3) Will removal bilateral groin VAC in OR during amputation.  Discussed with Dr. Wallis Mart Josely Moffat PA-C 07/21/2018 1:34 PM

## 2018-07-21 NOTE — Progress Notes (Signed)
Hypoglycemic Event  CBG: 65  Treatment: of orange juice  Symptoms: none  Follow-up CBG: Time:1700 CBG Result:93  Possible Reasons for Event:   Comments/MD notified:no    Retia Passe

## 2018-07-21 NOTE — Progress Notes (Signed)
Patient has transfer order to MedSurg floor.  She did have lower abdominal pain this morning which appears to have resolved after irrigation of Foley catheter.  After transfer, PCCM will sign off. Please call if we can be of further assistance.   Billy Fischer, MD PCCM service Mobile [phone] Pager [phone] 07/21/2018 12:57 PM

## 2018-07-22 ENCOUNTER — Encounter: Payer: Self-pay | Admitting: Vascular Surgery

## 2018-07-22 DIAGNOSIS — L97519 Non-pressure chronic ulcer of other part of right foot with unspecified severity: Secondary | ICD-10-CM

## 2018-07-22 DIAGNOSIS — Z7982 Long term (current) use of aspirin: Secondary | ICD-10-CM

## 2018-07-22 DIAGNOSIS — I252 Old myocardial infarction: Secondary | ICD-10-CM

## 2018-07-22 DIAGNOSIS — L97429 Non-pressure chronic ulcer of left heel and midfoot with unspecified severity: Secondary | ICD-10-CM

## 2018-07-22 DIAGNOSIS — I82409 Acute embolism and thrombosis of unspecified deep veins of unspecified lower extremity: Secondary | ICD-10-CM

## 2018-07-22 LAB — GLUCOSE, CAPILLARY
Glucose-Capillary: 230 mg/dL — ABNORMAL HIGH (ref 70–99)
Glucose-Capillary: 209 mg/dL — ABNORMAL HIGH (ref 70–99)
Glucose-Capillary: 153 mg/dL — ABNORMAL HIGH (ref 70–99)
Glucose-Capillary: 152 mg/dL — ABNORMAL HIGH (ref 70–99)
Glucose-Capillary: 173 mg/dL — ABNORMAL HIGH (ref 70–99)

## 2018-07-22 LAB — CBC
HCT: 30.2 % — ABNORMAL LOW (ref 36.0–46.0)
Hemoglobin: 9.1 g/dL — ABNORMAL LOW (ref 12.0–15.0)
MCH: 28.7 pg (ref 26.0–34.0)
MCHC: 30.1 g/dL (ref 30.0–36.0)
MCV: 95.3 fL (ref 80.0–100.0)
Platelets: 211 10*3/uL (ref 150–400)
RBC: 3.17 MIL/uL — ABNORMAL LOW (ref 3.87–5.11)
RDW: 15.7 % — ABNORMAL HIGH (ref 11.5–15.5)
WBC: 9.6 10*3/uL (ref 4.0–10.5)
nRBC: 0 % (ref 0.0–0.2)

## 2018-07-22 LAB — HEPARIN LEVEL (UNFRACTIONATED): Heparin Unfractionated: 0.4 IU/mL (ref 0.30–0.70)

## 2018-07-22 LAB — BASIC METABOLIC PANEL
Anion gap: 4 — ABNORMAL LOW (ref 5–15)
BUN: 28 mg/dL — AB (ref 8–23)
CO2: 30 mmol/L (ref 22–32)
Calcium: 8.1 mg/dL — ABNORMAL LOW (ref 8.9–10.3)
Chloride: 102 mmol/L (ref 98–111)
Creatinine, Ser: 0.76 mg/dL (ref 0.44–1.00)
GFR calc Af Amer: >60 mL/min (ref >60)
GFR calc non Af Amer: >60 mL/min (ref >60)
Glucose, Bld: 256 mg/dL — ABNORMAL HIGH (ref 70–99)
Potassium: 4 mmol/L (ref 3.5–5.1)
Sodium: 136 mmol/L (ref 135–145)

## 2018-07-22 NOTE — Progress Notes (Signed)
PT Cancellation Note  Patient Details Name: April [name] MRN: [id number] DOB: [birth year]   Cancelled Treatment:    Reason Eval/Treat Not Completed: Fatigue/lethargy limiting ability to participate. Treatment attempted; pt soundly sleeping and unable to awaken fully for participation in PT exercises. Re attempt tomorrow.    Scot DockHeidi E Abeeha Twist, PTA 07/22/2018, 3:29 PM

## 2018-07-22 NOTE — Progress Notes (Signed)
[facility] - [place] at [facility]   PATIENT NAME: April [name]    MR#:  [id number]  DATE OF BIRTH:  [birth year]  SUBJECTIVE:  Family is at bedside.  Plan for BKA on Thursday.  No acute events overnight.   REVIEW OF SYSTEMS:    Review of Systems  Constitutional: Negative for fever, chills weight loss HENT: Negative for ear pain, nosebleeds, congestion, facial swelling, rhinorrhea, neck pain, neck stiffness and ear discharge.    Hard of hearing   respiratory: Negative for cough, shortness of breath, wheezing  Cardiovascular: Negative for chest pain, palpitations and leg swelling.  Gastrointestinal: Negative for heartburn, abdominal pain, vomiting, diarrhea or consitpation Genitourinary: Negative for dysuria, urgency, frequency, hematuria Musculoskeletal: Negative for back pain or joint pain Neurological: Negative for dizziness, seizures, syncope, focal weakness,  numbness and headaches.  Hematological: Does not bruise/bleed easily.  Psychiatric/Behavioral: Negative for hallucinations, confusion, dysphoric mood  Right foot infection and left heel ulceration  Tolerating Diet: yes      DRUG ALLERGIES:   Allergies  Allergen Reactions  . Eggs Or Egg-Derived Products Diarrhea, Itching and Nausea And Vomiting    VITALS:  Blood pressure 120/62, pulse 76, temperature 98.2 F (36.8 C), temperature source Oral, resp. rate 16, height 5\' 2"  (1.575 m), weight 49.4 kg, SpO2 100 %.  PHYSICAL EXAMINATION:  Constitutional: Appears well-developed and well-nourished. No distress. HENT: Normocephalic. Marland Kitchen Oropharynx is clear and moist.  Eyes: Conjunctivae and EOM are normal. PERRLA, no scleral icterus.  Neck: Normal ROM. Neck supple. No JVD. No tracheal deviation. CVS: RRR, S1/S2 +, no murmurs, no gallops, no carotid bruit.  Pulmonary: Effort and breath sounds normal, no stridor, rhonchi, wheezes, rales.  Abdominal: Soft. BS +,  no distension, tenderness, rebound or  guarding.  Musculoskeletal: Normal range of motion. No edema and no tenderness.  Neuro: Alert. CN 2-12 grossly intact. No focal deficits. Skin: Wound VAC to right foot and left heel with eschar Psychiatric: Normal mood and affect.      LABORATORY PANEL:   CBC Recent Labs  Lab 07/22/18 0456  WBC 9.6  HGB 9.1*  HCT 30.2*  PLT 211   ------------------------------------------------------------------------------------------------------------------  Chemistries  Recent Labs  Lab 07/17/18 0526  07/22/18 0456  NA 134*   < > 136  K 4.7   < > 4.0  CL 99   < > 102  CO2 25   < > 30  GLUCOSE 260*   < > 256*  BUN 30*   < > 28*  CREATININE 1.04*   < > 0.76  CALCIUM 8.3*   < > 8.1*  MG 2.1  --   --    < > = values in this interval not displayed.   ------------------------------------------------------------------------------------------------------------------  Cardiac Enzymes Recent Labs  Lab 07/16/18 1737 07/17/18 0215 07/18/18 0210  TROPONINI 0.11* 1.19* 0.40*   ------------------------------------------------------------------------------------------------------------------  RADIOLOGY:  No results found.   ASSESSMENT AND PLAN:   63 year old female with a history of DVT on Xarelto, diabetes and COPD who was directly admitted from podiatry office due to right foot infection and left heel ulceration.  1.  Bilateral infected diabetic foot wounds with right foot osteomyelitis/peripheral vascular disease: Plan for BKA on Thursday. MRSA from wound culture and ID recommends continuing vancomycin and continue Rocephin   2.  Severe PAD bilateral status post endovascular and open intervention with vascular surgery now with osteomyelitis of the left heel and right foot wound VAC: Plan for left BKA which would remove nonviable  tissue Continue statin and aspirin   3.  Hypotension: Patient is off of pressors. Continue Midodrine.   4.  Non-STEMI: Patient evaluated by  cardiology.  Recommendations for supportive management. Continue aspirin, statin, metoprolol Troponin max 1.19 5.Tobacco dependence: Patient is encouraged to quit smoking. Counseling was provided for 4 minutes.  6.  Urinary retention: Continue tamsulosin  7.  Diabetes: Continue NovoLog, Lantus and sliding scale  8.  History of DVT: Currently on heparin drip.    management plans discussed with the patient and family and they arein agreement.  CODE STATUS:  DNR  TOTAL TIME TAKING CARE OF THIS PATIENT: 25 minutes.     POSSIBLE D/C 3-5 days, DEPENDING ON CLINICAL CONDITION.   Conan Mcmanaway M.D on 07/22/2018 at 11:44 AM  Between 7am to 6pm - Pager - [phone] After 6pm go to [email or web address] - password [email or web address]  [facility]  Office  [phone]  CC: Primary care physician; Mickel Fuchs, MD  Note: This dictation was prepared with Dragon dictation along with smaller phrase technology. Any transcriptional errors that result from this process are unintentional.

## 2018-07-22 NOTE — Progress Notes (Signed)
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  Allergies  Allergen Reactions  . Eggs Or Egg-Derived Products Diarrhea, Itching and Nausea And Vomiting    Patient Measurements: Height: 5\' 2"  (157.5 cm) Weight: 108 lb 14.5 oz (49.4 kg) IBW/kg (Calculated) : 50.1 Heparin Dosing Weight: 36.6 kg - reported by Jacqulyn Cane RN  Vital Signs: Temp: 97.9 F (36.6 C) (01/20 2100) Temp Source: Oral (01/20 2100) BP: 114/48 (01/21 0033) Pulse Rate: 85 (01/21 0033)  Labs: Recent Labs    07/20/18 0518  07/20/18 1940 07/21/18 0214 07/22/18 0456  HGB 8.9*  --   --  8.2* 9.1*  HCT 28.8*  --   --  26.8* 30.2*  PLT 174  --   --  190 211  HEPARINUNFRC 0.84*   < > 0.54 0.50 0.40  CREATININE 0.76  --   --  0.74 0.76   < > = values in this interval not displayed.    Estimated Creatinine Clearance: 56.1 mL/min (by C-G formula based on SCr of 0.76 mg/dL).   Assessment: Pharmacy was consulted for heparin.  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 Will follow aPTT.   Goal of Therapy:  APTT level 66-102 seconds or Heparin level 0.3-0.7 units/ml once heparin level correlates with aPTT  Monitor platelets by anticoagulation protocol: Yes  Heparin Course: 1/13 AM heparin level 0.24. 600 unit  bolus and increase rate t o 850 units/hr. Recheck in 6 hours.  1/13:  HL @ 2055 = 0.17 .   Heparin was not held during any point on 1/13, angiogram was not done.   Will order heparin 1750 units IV X 1 once and increase drip rate to 1050 units/hr.  Will recheck HL 6 hrs after rate change.  1/14 Patient had angiogram with heparin on hold. 01/17 @ 0200 HL 0.41 therapeutic. Will continue current rate and will recheck HL @ 0800, Hgb now below 7 @ 6.5 will continue to monitor.  01/17 @ 0500 patient's heparin drip to be held for central line placement  will retime HL for 1200. 01/18 @ 0100 HL 0.44 therapeutic. Will continue current rate and will recheck HL @ 0700. Patient is s/p 2 transfusions w/ a hgb of 9.5 mg/dL will continue to monitor. 01/19 @ 0500 HL 0.84 supratherapeutic. Will decrease rate to 1150 units/hr and will recheck @ 1300, will continue monitoring CBC.  Plan:  1/20 0200 HL= 0.50. Will continue current rate of heparin 1100 units/hr and f/u w/ am labs. CBC stable will continue to monitor.  1/21 AM heparin level 0.40. Continue current regimen. Recheck heparin level and CBC with tomorrow AM labs.  Erich Montane, PharmD Clinical Pharmacist 07/22/2018,5:57 AM

## 2018-07-22 NOTE — Progress Notes (Signed)
Pt family member called out to desk requesting pain meds for pt. Primary RN took pain medication to pt as requested. After pt received medication after a few moments while primary RN was at computer charting pt pulled blanket up above  head for a moment. Primary RN went and assessed pt and lifted back blanket and witnessed one Norco. Primary RN put pill back in pts mouth and ensured that pt swallowed and pill was gone.  Pt had a  similar event when pt was in 220 prior to going to ICU. Pt had dropped pill and somewhat covered it up with blanket. Primary RN witnessed the pill under the blanket  and gave the pill again.  Incident will be passed along to oncoming RN to notify pts primary Dr.

## 2018-07-22 NOTE — Progress Notes (Signed)
Palliative Note:  Patient awake, A&O x3. Reports she had a good night, outside of the pain. Complains of continuous pain, but endorses that she recently received medication. Granddaughter is at the bedside, and states she stayed with her all night for support. No other family at the bedside.   Briefly reviewed goals of care discussion from yesterday. Granddaughter able to verbalized conversation and expressed the plan remains to proceed with BKA on Thursday. States family discussed in detail after our meeting and they remain hopeful patient will recover and have some increased quality of life and longevity. They are aware of risk and willing to proceed. Patient verbalized her wishes to proceed with hopes of feeling better. Granddaughter asking about next steps with palliative. Advised once patient is medically stable and ready for discharge outpatient palliative will be referenced to support once she leaves the hospital. Until then myself and team will continue to support while patient remains hospitalized. Patient and granddaughter verbalized understanding and appreciation.   Patient and granddaughter confirmed wishes of DNR/DNI and that patient had on purple DNR bracelet.   Exam: Awake, A&O x3. Hard of hearing. Decreased breath sounds in bases, positive bowel sounds, no edema, bilateral foot dressings, right heel wound vac, bilateral groin wound vac, left heel eschar, patient cooperative and calm   Plan:  DNR/DNI  Continue to treat the treatable  BKA on Thursday per Vascular Surgery  Outpatient Palliative at discharge  PMT will continue to follow and support  Total Time: 35 min  Greater than 50%  of this time was spent counseling and coordinating care related to the above assessment and plan  Willette Alma, AGPCNP-BC Palliative Medicine Team  Pager: [phone] Amion: Thea Alken

## 2018-07-22 NOTE — Progress Notes (Signed)
Date of Admission:  07/09/2018     07/11/2018 1.Excisional debridement to muscle right foot ulceration 2.  Excision distal first metatarsal right foot 3.  Placement of wound VAC right foot  1/14 Percutaneous transluminal angioplasty and stent placement right superficial femoral artery  1/16 Left common femoral, profunda femoris, and superficial femoral artery endarterectomies 2. Right common femoralandprofunda femoris endarterectomies 3.Partial resection of right SFA stent Subjective: No specific complaints Weak  Pale   Medications:  . aspirin EC  81 mg Oral Daily  . atorvastatin  40 mg Oral q1800  . ferrous sulfate  325 mg Oral BID WC  . furosemide  40 mg Oral Daily  . insulin aspart  0-15 Units Subcutaneous TID WC  . insulin aspart  0-5 Units Subcutaneous QHS  . insulin aspart  3 Units Subcutaneous TID WC  . insulin glargine  10 Units Subcutaneous QHS  . mouth rinse  15 mL Mouth Rinse BID  . metoprolol tartrate  5 mg Intravenous Q6H  . midodrine  10 mg Oral TID WC  . mometasone-formoterol  2 puff Inhalation BID  . multivitamin-lutein  1 capsule Oral Daily  . pantoprazole  40 mg Oral BID  . polyethylene glycol  17 g Oral Daily  . ENSURE MAX PROTEIN  11 oz Oral BID  . tamsulosin  0.4 mg Oral Daily  . tiotropium  18 mcg Inhalation Daily  . ascorbic acid  500 mg Oral BID    Objective: Vital signs in last 24 hours: Temp:  [97.9 F (36.6 C)-98.2 F (36.8 C)] 98.2 F (36.8 C) (01/21 56210608) Pulse Rate:  [76-86] 86 (01/21 1157) Resp:  [16-20] 16 (01/21 1130) BP: (104-120)/(48-65) 112/65 (01/21 1157) SpO2:  [98 %-100 %] 98 % (01/21 1157)  PHYSICAL EXAM:  General: Awake, pale, weak, emaciated no distress,  Neck: Rt IJ  Lungs: b/l air entry Heart: Regular rate and rhythm, no murmur, rub or gallop. Abdomen: Soft, non-tender,not distended. Bowel sounds normal. No masses Extremities: b/l femoral wound vac Rt foot- wound vac Left heel eschar Skin: No rashes or  lesions. Or bruising Lymph: Cervical, supraclavicular normal. Neurologic: Grossly non-focal  Lab Results CBC Latest Ref Rng & Units 07/22/2018 07/21/2018 07/20/2018  WBC 4.0 - 10.5 K/uL 9.6 9.6 10.7(H)  Hemoglobin 12.0 - 15.0 g/dL 3.0(Q9.1(L) 6.5(H8.2(L) 8.9(L)  Hematocrit 36.0 - 46.0 % 30.2(L) 26.8(L) 28.8(L)  Platelets 150 - 400 K/uL 211 190 174   CMP Latest Ref Rng & Units 07/22/2018 07/21/2018 07/20/2018  Glucose 70 - 99 mg/dL 846(N256(H) 629(B223(H) 92  BUN 8 - 23 mg/dL 28(U28(H) 13(K28(H) 44(W31(H)  Creatinine 0.44 - 1.00 mg/dL 1.020.76 7.250.74 3.660.76  Sodium 135 - 145 mmol/L 136 136 135  Potassium 3.5 - 5.1 mmol/L 4.0 4.4 4.4  Chloride 98 - 111 mmol/L 102 106 108  CO2 22 - 32 mmol/L 30 27 24   Calcium 8.9 - 10.3 mg/dL 8.1(L) 7.8(L) 7.9(L)  Total Protein 6.5 - 8.1 g/dL - - -  Total Bilirubin 0.3 - 1.2 mg/dL - - -  Alkaline Phos 38 - 126 U/L - - -  AST 15 - 41 U/L - - -  ALT 0 - 44 U/L - - -   Microbiology: Pathology-1/10 BONE, RIGHT FIRST METATARSAL; DEBRIDEMENT:  - FOCAL ACUTE OSTEOMYELITIS.  - ULCERATION AND INFLAMED GRANULATION TISSUE.  - SURGICAL MARGINS SHOWING VIABLE BONE AND SOFT TISSUE WITHOUT EVIDENCE  OF ACUTE INFLAMMATION  Assessment/Plan: Bilateral foot infection secondary to diabetes and peripheral vascular disease. Rt foot ulcer :Status post  right metatarsal amputation and debridement of the ulcer. amputated bone margin clear of osteo Has a wound VAC. MRSA in the culture. On vanco 750mg  Q 24 - last trough 15, cr 0.76 Plan is to give vancomycin until 08/08/18 ( 4 weeks)  Left heel ischemic ulcer/eschar- Left BKA being planned Can stop ceftriaxone after that  . Severe PAD.significant plaque in bilateral common femoral, profunda femoris arteries and the left proximal SFA. Underwent Left common femoral, profunda femoris, and superficial femoral artery endarterectomies 2. Right common femoralandprofunda femoris endarterectomies  On 07/17/18  has wound vac    Diabetes mellitus. Poorly  controlled, recent A1c was 10.9. On Lantus and NovoLog. Management as per primary team  NSTEMI : medical management-on aspirin, statin and metoprolol  COPD current smoker.  Urinary retention has a urethral catheter and is on tamsulosin.   Anemia of chronic disease Protein calorie malnutrition   Recurrent DVT   Discussed the management with her nurse ID will follow her peripherally- call if needed

## 2018-07-23 LAB — BASIC METABOLIC PANEL
ANION GAP: 6 (ref 5–15)
BUN: 20 mg/dL (ref 8–23)
CO2: 30 mmol/L (ref 22–32)
Calcium: 8.2 mg/dL — ABNORMAL LOW (ref 8.9–10.3)
Chloride: 100 mmol/L (ref 98–111)
Creatinine, Ser: 0.64 mg/dL (ref 0.44–1.00)
GFR calc Af Amer: >60 mL/min (ref >60)
GFR calc non Af Amer: >60 mL/min (ref >60)
Glucose, Bld: 173 mg/dL — ABNORMAL HIGH (ref 70–99)
Potassium: 4 mmol/L (ref 3.5–5.1)
Sodium: 136 mmol/L (ref 135–145)

## 2018-07-23 LAB — CULTURE, BLOOD (ROUTINE X 2)
CULTURE: NO GROWTH
Culture: NO GROWTH
SPECIAL REQUESTS: ADEQUATE
Special Requests: ADEQUATE

## 2018-07-23 LAB — CBC
HEMATOCRIT: 31.1 % — AB (ref 36.0–46.0)
HEMOGLOBIN: 9.7 g/dL — AB (ref 12.0–15.0)
MCH: 29 pg (ref 26.0–34.0)
MCHC: 31.2 g/dL (ref 30.0–36.0)
MCV: 92.8 fL (ref 80.0–100.0)
Platelets: 241 10*3/uL (ref 150–400)
RBC: 3.35 MIL/uL — ABNORMAL LOW (ref 3.87–5.11)
RDW: 15.9 % — ABNORMAL HIGH (ref 11.5–15.5)
WBC: 10.2 10*3/uL (ref 4.0–10.5)
nRBC: 0 % (ref 0.0–0.2)

## 2018-07-23 LAB — GLUCOSE, CAPILLARY
Glucose-Capillary: 154 mg/dL — ABNORMAL HIGH (ref 70–99)
Glucose-Capillary: 189 mg/dL — ABNORMAL HIGH (ref 70–99)
Glucose-Capillary: 168 mg/dL — ABNORMAL HIGH (ref 70–99)
Glucose-Capillary: 117 mg/dL — ABNORMAL HIGH (ref 70–99)

## 2018-07-23 LAB — HEPARIN LEVEL (UNFRACTIONATED): Heparin Unfractionated: 0.38 IU/mL (ref 0.30–0.70)

## 2018-07-23 MED ORDER — SODIUM CHLORIDE 0.9 % IV SOLN
INTRAVENOUS | Status: DC
  Administered 2018-07-23: 23:00:00 via INTRAVENOUS

## 2018-07-23 NOTE — Progress Notes (Signed)
PT Cancellation Note  Patient Details Name: April [name] MRN: [id number] DOB: [birth year]   Cancelled Treatment:    Reason Eval/Treat Not Completed: Pain limiting ability to participate. Treatment attempted; pt refuses stating "my pain has not decreased at all". Re attempt at a later time/date.    Scot Dock, PTA 07/23/2018, 12:32 PM

## 2018-07-23 NOTE — Anesthesia Preprocedure Evaluation (Addendum)
Anesthesia Evaluation  Patient identified by MRN, date of birth, ID band Patient awake    Reviewed: Allergy & Precautions, NPO status , Patient's Chart, lab work & pertinent test results, reviewed documented beta blocker date and time   Airway Mallampati: II  TM Distance: >3 FB     Dental  (+) Chipped   Pulmonary COPD, Current Smoker,           Cardiovascular + Past MI and + Peripheral Vascular Disease       Neuro/Psych Anxiety    GI/Hepatic   Endo/Other  diabetes, Type 2  Renal/GU      Musculoskeletal   Abdominal   Peds  Hematology   Anesthesia Other Findings Past Medical History: No date: Anxiety No date: Colon polyps No date: COPD (chronic obstructive pulmonary disease) (HCC) No date: Diabetes mellitus without complication (HCC) No date: DVT (deep venous thrombosis) (HCC) No date: DVT (deep venous thrombosis) (HCC) No date: History of colonic polyps No date: Hyperlipidemia Smokes. Heparin d/c'd. On B-blockers. EKG ok. EF 35-49. No date: Peripheral artery disease (HCC) 08/2012: Pulmonary nodules/lesions, multiple     Comment:  NEGATIVE PET SCAN No date: Retroperitoneal abscess (HCC)     Comment:  history of in 04/2013 No date: Tobacco abuse   Reproductive/Obstetrics                            Anesthesia Physical Anesthesia Plan  ASA: IV  Anesthesia Plan: General   Post-op Pain Management:    Induction: Intravenous  PONV Risk Score and Plan:   Airway Management Planned: LMA  Additional Equipment:   Intra-op Plan:   Post-operative Plan:   Informed Consent: I have reviewed the patients History and Physical, chart, labs and discussed the procedure including the risks, benefits and alternatives for the proposed anesthesia with the patient or authorized representative who has indicated his/her understanding and acceptance.       Plan Discussed with: CRNA  Anesthesia  Plan Comments:         Anesthesia Quick Evaluation

## 2018-07-23 NOTE — Progress Notes (Signed)
Pharmacist, Harrold Donath, consulted regarding heparin drip and surgery in am, what time to stop drip; acknowledged, agreed to consult with Vascular regarding drip; Dr. Wyn Quaker texted at [phone]: "226 on Heparin drip, what time do you want [place] to stop drip prior to surgery in am?" Awaiting callback/response. Windy Carina, RN 7:44 PM 07/23/2018

## 2018-07-23 NOTE — Progress Notes (Signed)
Dr. Anne Hahn notified of BP 107/51, Pulse 84; Lopressor ordered every 6 hours; acknowledged; Order written to hold 0000 Lopressor. Will continue to monitor. Windy Carina, RN 11:42 PM 07/23/2018

## 2018-07-23 NOTE — Progress Notes (Signed)
Dr. Wyn Quakerew notified of heparin drip and surgery; acknowledged; new order written to stop heparin drip at 0800, 07/24/2018. Windy Carinaurner,Justine Cossin K, RN 9:42 PM 07/23/2018

## 2018-07-23 NOTE — Progress Notes (Signed)
[facility] - [place] at [facility]   PATIENT NAME: April [name]    MR#:  [id number]  DATE OF BIRTH:  [birth year]  SUBJECTIVE:   Family at bedside.  No acute events overnight.  REVIEW OF SYSTEMS:    Review of Systems  Constitutional: Negative for fever, chills weight loss HENT: Negative for ear pain, nosebleeds, congestion, facial swelling, rhinorrhea, neck pain, neck stiffness and ear discharge.    Hard of hearing   respiratory: Negative for cough, shortness of breath, wheezing  Cardiovascular: Negative for chest pain, palpitations and leg swelling.  Gastrointestinal: Negative for heartburn, abdominal pain, vomiting, diarrhea or consitpation Genitourinary: Negative for dysuria, urgency, frequency, hematuria Musculoskeletal: Negative for back pain or joint pain Neurological: Negative for dizziness, seizures, syncope, focal weakness,  numbness and headaches.  Hematological: Does not bruise/bleed easily.  Psychiatric/Behavioral: Negative for hallucinations, confusion, dysphoric mood  Right foot infection and left heel ulceration  Tolerating Diet: yes      DRUG ALLERGIES:   Allergies  Allergen Reactions  . Eggs Or Egg-Derived Products Diarrhea, Itching and Nausea And Vomiting    VITALS:  Blood pressure (!) 113/57, pulse 88, temperature 98.3 F (36.8 C), temperature source Oral, resp. rate 19, height 5\' 2"  (1.575 m), weight 49.4 kg, SpO2 96 %.  PHYSICAL EXAMINATION:  Constitutional: Appears well-developed and well-nourished. No distress. HENT: Normocephalic. Marland Kitchen. Oropharynx is clear and moist.  Eyes: Conjunctivae and EOM are normal. PERRLA, no scleral icterus.  Neck: Normal ROM. Neck supple. No JVD. No tracheal deviation. CVS: RRR, S1/S2 +, no murmurs, no gallops, no carotid bruit.  Pulmonary: Effort and breath sounds normal, no stridor, rhonchi, wheezes, rales.  Abdominal: Soft. BS +,  no distension, tenderness, rebound or guarding.  Musculoskeletal:  Normal range of motion. No edema and no tenderness.  Neuro: Alert. CN 2-12 grossly intact. No focal deficits. Skin: Wound VAC to right foot and left heel with eschar Psychiatric: Normal mood and affect.      LABORATORY PANEL:   CBC Recent Labs  Lab 07/23/18 0441  WBC 10.2  HGB 9.7*  HCT 31.1*  PLT 241   ------------------------------------------------------------------------------------------------------------------  Chemistries  Recent Labs  Lab 07/17/18 0526  07/23/18 0441  NA 134*   < > 136  K 4.7   < > 4.0  CL 99   < > 100  CO2 25   < > 30  GLUCOSE 260*   < > 173*  BUN 30*   < > 20  CREATININE 1.04*   < > 0.64  CALCIUM 8.3*   < > 8.2*  MG 2.1  --   --    < > = values in this interval not displayed.   ------------------------------------------------------------------------------------------------------------------  Cardiac Enzymes Recent Labs  Lab 07/16/18 1737 07/17/18 0215 07/18/18 0210  TROPONINI 0.11* 1.19* 0.40*   ------------------------------------------------------------------------------------------------------------------  RADIOLOGY:  No results found.   ASSESSMENT AND PLAN:   63 year old female with a history of DVT on Xarelto, diabetes and COPD who was directly admitted from podiatry office due to right foot infection and left heel ulceration.  1.  Bilateral infected diabetic foot wounds with right foot osteomyelitis/peripheral vascular disease: Plan for BKA on Thursday. MRSA from wound culture and ID recommends continuing vancomycin through February 7 and continue Rocephin until after BKA.   2.  Severe PAD bilateral status post endovascular and open intervention with vascular surgery now with osteomyelitis of the left heel and right foot wound VAC: Plan for left BKA which would remove nonviable  tissue Continue statin and aspirin   3.  Hypotension: Patient is off of pressors. Continue Midodrine.   4.  Non-STEMI: Patient evaluated  by cardiology.  Recommendations for supportive management. Continue aspirin, statin, metoprolol Troponin max 1.19 5.Tobacco dependence: Patient is encouraged to quit smoking. Counseling was provided for 4 minutes.  6.  Urinary retention: Continue tamsulosin  7.  Diabetes: Continue NovoLog, Lantus and sliding scale  8.  History of DVT: Currently on heparin drip.    management plans discussed with the patient and family and they are in agreement.  CODE STATUS:  DNR  TOTAL TIME TAKING CARE OF THIS PATIENT: 22 minutes.     POSSIBLE D/C 3-5 days, DEPENDING ON CLINICAL CONDITION.   April [name] M.D on 07/23/2018 at 11:34 AM  Between 7am to 6pm - Pager - [phone] After 6pm go to [email or web address] - password [email or web address]  [facility]  Office  [phone]  CC: Primary care physician; April Fuchs, MD  Note: This dictation was prepared with Dragon dictation along with smaller phrase technology. Any transcriptional errors that result from this process are unintentional.

## 2018-07-23 NOTE — Progress Notes (Signed)
Pharmacy Antibiotic Note  April [name] is a 63 y.o. female admitted on 07/09/2018 with Wound infection.  Pharmacy has been consulted for vancomycin dosing. During an admission here in November she received vancomycin for the same indication and her regimen was 1 gram every 24 hours resulting in a Vt of 12 mcg/mL. Vancomycin dose was started at a dose of 1000 mg every 24 hours (1st dose was 750mg ). A vancomycin peak level was drawn at steady-state and a random level the next morning were used to create a pharmacokinetic profile. AUC of 580 was calculated.   Plan: 1/17 Scr increased. Vancomycin dose changed to 750mg  IV every 24 hours. Goal AUC 400-550. Expected AUC: 470 SCr used: 0.86 Will continue to monitor renal function and adjust dose as needed. Will order post distribution vanc level and vanc trough 1/19-1/20  Calculated dosing per levels resulted in an AUC - 515.5 and is therapeutic.   Will maintain current regimen of Vancomycin 750 mg every 24 hours.  -Continue patient on cefepime 1g IV q12h per CrCl 30-60 ml/min  1/18 Scr 0.87 >>0.86. Will continue current Vancomycin dose and check AUC/levels/Scr tomorrow.    Height: 5\' 2"  (157.5 cm) Weight: 108 lb 14.5 oz (49.4 kg) IBW/kg (Calculated) : 50.1  Temp (24hrs), Avg:98.3 F (36.8 C), Min:98.2 F (36.8 C), Max:98.3 F (36.8 C)  Recent Labs  Lab 07/19/18 0104 07/19/18 0440 07/20/18 0518 07/21/18 0214 07/21/18 2251 07/22/18 0456 07/23/18 0441  WBC  --  12.6* 10.7* 9.6  --  9.6 10.2  CREATININE 0.86  --  0.76 0.74  --  0.76 0.64  VANCOTROUGH  --   --   --   --  15  --   --   VANCOPEAK  --   --   --  29*  --   --   --     Estimated Creatinine Clearance: 56.1 mL/min (by C-G formula based on SCr of 0.64 mg/dL).    Antimicrobials this admission: 1/8 vancomcyin >>  1/8 pip/tazo >> 1/12 1/17 cefepime>>   Dose adjustments this admission: 1/9 Vancomycin 750mg  every 36 hours-----> 1000mg  q24h 1/13 vancomycin 1000mg  q24h---->  1250mg  q24h (dose not administered) 1/14 vancomycin 1250mg  q24h----> 1000mg  q24h 1/17 Vancomycin 1000 mg q24h ----> 750mg  q24h  Microbiology results: 1/8 Heel Cx: MRSA  Thank you for allowing pharmacy to be a part of this patient's care.  Orinda Kenner, PharmD Clinical Pharmacist 07/23/2018 8:17 AM

## 2018-07-23 NOTE — Progress Notes (Signed)
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  Allergies  Allergen Reactions  . Eggs Or Egg-Derived Products Diarrhea, Itching and Nausea And Vomiting    Patient Measurements: Height: 5\' 2"  (157.5 cm) Weight: 108 lb 14.5 oz (49.4 kg) IBW/kg (Calculated) : 50.1 Heparin Dosing Weight: 36.6 kg - reported by Jacqulyn Cane RN  Vital Signs: Temp: 98.3 F (36.8 C) (01/22 0543) Temp Source: Oral (01/22 0543) BP: 113/57 (01/22 0543) Pulse Rate: 88 (01/22 0543)  Labs: Recent Labs    07/21/18 0214 07/22/18 0456 07/23/18 0441  HGB 8.2* 9.1* 9.7*  HCT 26.8* 30.2* 31.1*  PLT 190 211 241  HEPARINUNFRC 0.50 0.40 0.38  CREATININE 0.74 0.76 0.64    Estimated Creatinine Clearance: 56.1 mL/min (by C-G formula based on SCr of 0.64 mg/dL).   Assessment: Pharmacy was consulted for heparin.  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 Will follow aPTT.   Goal of Therapy:  APTT level 66-102 seconds or Heparin level 0.3-0.7 units/ml once heparin level correlates with aPTT  Monitor platelets by anticoagulation protocol: Yes  Heparin Course: 1/13 AM heparin level 0.24. 600 unit  bolus and increase rate t o 850 units/hr. Recheck in 6 hours.  1/13:  HL @ 2055 = 0.17 .   Heparin was not held during any point on 1/13, angiogram was not done.   Will order heparin 1750 units IV X 1 once and increase drip rate to 1050 units/hr.  Will recheck HL 6 hrs after rate change.  1/14 Patient had angiogram with heparin on hold. 01/17 @ 0200 HL 0.41 therapeutic. Will continue current rate and will recheck HL @ 0800, Hgb now below 7 @ 6.5 will continue to monitor.  01/17 @ 0500 patient's heparin drip to be held for central line placement will retime HL for 1200. 01/18 @ 0100 HL 0.44 therapeutic. Will continue current rate and will recheck HL @ 0700.  Patient is s/p 2 transfusions w/ a hgb of 9.5 mg/dL will continue to monitor. 01/19 @ 0500 HL 0.84 supratherapeutic. Will decrease rate to 1150 units/hr and will recheck @ 1300, will continue monitoring CBC.  Plan:  1/20 0200 HL= 0.50. Will continue current rate of heparin 1100 units/hr and f/u w/ am labs. CBC stable will continue to monitor.  1/21 AM heparin level 0.40. Continue current regimen. Recheck heparin level and CBC with tomorrow AM labs.  1/22 AM heparin level 0.38. Continue current regimen. Recheck heparin level and CBC with tomorrow AM labs.  Erich Montane, PharmD Clinical Pharmacist 07/23/2018,6:21 AM

## 2018-07-23 NOTE — Progress Notes (Signed)
[facility]  Daily Progress Note   Subjective  - 6 Days Post-Op  Patient a little bit somnolent at this point.  Was more alert before getting pain medicines recently.  Has been having some pain in her feet.  Objective Vitals:   07/22/18 2205 07/22/18 2325 07/23/18 0543 07/23/18 1227  BP: (!) 112/59 114/64 (!) 113/57 104/60  Pulse: 91 94 88 89  Resp: 20  19   Temp: 98.2 F (36.8 C)  98.3 F (36.8 C)   TempSrc: Oral  Oral   SpO2: 94% 94% 96% 93%  Weight:      Height:        Intake/Output Summary (Last 24 hours) at 07/23/2018 1531 Last data filed at 07/23/2018 1610 Gross per 24 hour  Intake 330.34 ml  Output 1500 ml  Net -1169.66 ml    PULM  CTAB CV  RRR VASC  both feet are warm with good capillary refill.  Laboratory CBC    Component Value Date/Time   WBC 10.2 07/23/2018 0441   HGB 9.7 (L) 07/23/2018 0441   HGB 13.6 10/14/2014 0644   HCT 31.1 (L) 07/23/2018 0441   HCT 41.3 10/14/2014 0644   PLT 241 07/23/2018 0441   PLT 156 10/14/2014 0644    BMET    Component Value Date/Time   NA 136 07/23/2018 0441   NA 142 10/14/2014 0644   K 4.0 07/23/2018 0441   K 3.6 10/14/2014 0644   CL 100 07/23/2018 0441   CL 109 10/14/2014 0644   CO2 30 07/23/2018 0441   CO2 29 10/14/2014 0644   GLUCOSE 173 (H) 07/23/2018 0441   GLUCOSE 142 (H) 10/14/2014 0644   BUN 20 07/23/2018 0441   BUN 9 10/14/2014 0644   CREATININE 0.64 07/23/2018 0441   CREATININE 0.53 10/14/2014 0644   CALCIUM 8.2 (L) 07/23/2018 0441   CALCIUM 7.7 (L) 10/14/2014 0644   GFRNONAA >60 07/23/2018 0441   GFRNONAA >60 10/14/2014 0644   GFRAA >60 07/23/2018 0441   GFRAA >60 10/14/2014 9604    Assessment/Planning: POD #6 s/p bilateral femoral endarterectomies   Left heel with osteomyelitis is nonsalvageable at this point.  Podiatry has recommended a left below-knee amputation and the patient and family are agreeable.  This will be performed tomorrow.    April [name]  07/23/2018, 3:31 PM

## 2018-07-24 ENCOUNTER — Inpatient Hospital Stay: Payer: Medicare HMO | Admitting: Anesthesiology

## 2018-07-24 ENCOUNTER — Encounter: Payer: Self-pay | Admitting: Anesthesiology

## 2018-07-24 ENCOUNTER — Encounter
Admission: AD | Disposition: A | Discharge status: Skilled Nursing Facility | Payer: Self-pay | Source: Ambulatory Visit | Attending: Internal Medicine

## 2018-07-24 DIAGNOSIS — M869 Osteomyelitis, unspecified: Secondary | ICD-10-CM

## 2018-07-24 DIAGNOSIS — I70292 Other atherosclerosis of native arteries of extremities, left leg: Secondary | ICD-10-CM

## 2018-07-24 DIAGNOSIS — Z9862 Peripheral vascular angioplasty status: Secondary | ICD-10-CM

## 2018-07-24 HISTORY — PX: AMPUTATION: SHX166

## 2018-07-24 LAB — WET PREP, GENITAL
Clue Cells Wet Prep HPF POC: NONE SEEN
Sperm: NONE SEEN
Trich, Wet Prep: NONE SEEN
Yeast Wet Prep HPF POC: NONE SEEN

## 2018-07-24 LAB — BASIC METABOLIC PANEL
Anion gap: 5 (ref 5–15)
BUN: 19 mg/dL (ref 8–23)
CHLORIDE: 100 mmol/L (ref 98–111)
CO2: 31 mmol/L (ref 22–32)
Calcium: 7.9 mg/dL — ABNORMAL LOW (ref 8.9–10.3)
Creatinine, Ser: 0.61 mg/dL (ref 0.44–1.00)
GFR calc Af Amer: >60 mL/min (ref >60)
GFR calc non Af Amer: >60 mL/min (ref >60)
Glucose, Bld: 231 mg/dL — ABNORMAL HIGH (ref 70–99)
Potassium: 4.1 mmol/L (ref 3.5–5.1)
SODIUM: 136 mmol/L (ref 135–145)

## 2018-07-24 LAB — CBC
HCT: 33.4 % — ABNORMAL LOW (ref 36.0–46.0)
Hemoglobin: 10.2 g/dL — ABNORMAL LOW (ref 12.0–15.0)
MCH: 28.9 pg (ref 26.0–34.0)
MCHC: 30.5 g/dL (ref 30.0–36.0)
MCV: 94.6 fL (ref 80.0–100.0)
Platelets: 262 10*3/uL (ref 150–400)
RBC: 3.53 MIL/uL — ABNORMAL LOW (ref 3.87–5.11)
RDW: 15.9 % — ABNORMAL HIGH (ref 11.5–15.5)
WBC: 12.4 10*3/uL — ABNORMAL HIGH (ref 4.0–10.5)
nRBC: 0 % (ref 0.0–0.2)

## 2018-07-24 LAB — GLUCOSE, CAPILLARY
Glucose-Capillary: 191 mg/dL — ABNORMAL HIGH (ref 70–99)
Glucose-Capillary: 167 mg/dL — ABNORMAL HIGH (ref 70–99)
Glucose-Capillary: 191 mg/dL — ABNORMAL HIGH (ref 70–99)
Glucose-Capillary: 222 mg/dL — ABNORMAL HIGH (ref 70–99)

## 2018-07-24 LAB — HEPARIN LEVEL (UNFRACTIONATED): HEPARIN UNFRACTIONATED: 0.45 [IU]/mL (ref 0.30–0.70)

## 2018-07-24 LAB — MAGNESIUM: MAGNESIUM: 1.6 mg/dL — AB (ref 1.7–2.4)

## 2018-07-24 LAB — PROTIME-INR
INR: 1.22
Prothrombin Time: 15.3 seconds — ABNORMAL HIGH (ref 11.4–15.2)

## 2018-07-24 LAB — TYPE AND SCREEN
ABO/RH(D): B POS
Antibody Screen: NEGATIVE

## 2018-07-24 SURGERY — AMPUTATION BELOW KNEE
Anesthesia: General | Laterality: Left

## 2018-07-24 MED ORDER — HEPARIN (PORCINE) 25000 UT/250ML-% IV SOLN
1100.0000 [IU]/h | INTRAVENOUS | Status: DC
  Administered 2018-07-25: 1100 [IU]/h via INTRAVENOUS

## 2018-07-24 MED ORDER — PROPOFOL 10 MG/ML IV BOLUS
INTRAVENOUS | Status: DC | PRN
  Administered 2018-07-24: 30 mg via INTRAVENOUS
  Administered 2018-07-24: 70 mg via INTRAVENOUS

## 2018-07-24 MED ORDER — FENTANYL CITRATE (PF) 100 MCG/2ML IJ SOLN
INTRAMUSCULAR | Status: AC
  Filled 2018-07-24: qty 2

## 2018-07-24 MED ORDER — DEXAMETHASONE SODIUM PHOSPHATE 10 MG/ML IJ SOLN
INTRAMUSCULAR | Status: DC | PRN
  Administered 2018-07-24: 5 mg via INTRAVENOUS

## 2018-07-24 MED ORDER — DEXAMETHASONE SODIUM PHOSPHATE 10 MG/ML IJ SOLN
INTRAMUSCULAR | Status: AC
  Filled 2018-07-24: qty 1

## 2018-07-24 MED ORDER — ONDANSETRON HCL 4 MG/2ML IJ SOLN
INTRAMUSCULAR | Status: DC | PRN
  Administered 2018-07-24: 4 mg via INTRAVENOUS

## 2018-07-24 MED ORDER — GLYCOPYRROLATE 0.2 MG/ML IJ SOLN
INTRAMUSCULAR | Status: DC | PRN
  Administered 2018-07-24: 0.2 mg via INTRAVENOUS

## 2018-07-24 MED ORDER — FENTANYL CITRATE (PF) 100 MCG/2ML IJ SOLN
25.0000 ug | INTRAMUSCULAR | Status: DC | PRN
  Administered 2018-07-24 (×2): 25 ug via INTRAVENOUS

## 2018-07-24 MED ORDER — PHENYLEPHRINE HCL 10 MG/ML IJ SOLN
INTRAMUSCULAR | Status: DC | PRN
  Administered 2018-07-24 (×3): 100 ug via INTRAVENOUS

## 2018-07-24 MED ORDER — ALTEPLASE 2 MG IJ SOLR
2.0000 mg | Freq: Once | INTRAMUSCULAR | Status: AC
  Administered 2018-07-24: 2 mg
  Filled 2018-07-24: qty 2

## 2018-07-24 MED ORDER — FENTANYL CITRATE (PF) 100 MCG/2ML IJ SOLN
INTRAMUSCULAR | Status: AC
  Administered 2018-07-24: 25 ug via INTRAVENOUS
  Filled 2018-07-24: qty 2

## 2018-07-24 MED ORDER — MAGNESIUM SULFATE 2 GM/50ML IV SOLN
2.0000 g | Freq: Once | INTRAVENOUS | Status: AC
  Administered 2018-07-24: 2 g via INTRAVENOUS
  Filled 2018-07-24: qty 50

## 2018-07-24 MED ORDER — FENTANYL CITRATE (PF) 100 MCG/2ML IJ SOLN
INTRAMUSCULAR | Status: DC | PRN
  Administered 2018-07-24 (×4): 25 ug via INTRAVENOUS

## 2018-07-24 MED ORDER — ONDANSETRON HCL 4 MG/2ML IJ SOLN: 4.0000 mg | Freq: Once | INTRAMUSCULAR | Status: DC | PRN

## 2018-07-24 MED ORDER — ONDANSETRON HCL 4 MG/2ML IJ SOLN
INTRAMUSCULAR | Status: AC
  Filled 2018-07-24: qty 2

## 2018-07-24 MED ORDER — KETOROLAC TROMETHAMINE 15 MG/ML IJ SOLN
15.0000 mg | Freq: Four times a day (QID) | INTRAMUSCULAR | Status: DC
  Administered 2018-07-24 – 2018-07-25 (×3): 15 mg via INTRAVENOUS
  Filled 2018-07-24 (×4): qty 1

## 2018-07-24 MED ORDER — SODIUM CHLORIDE 0.9 % IV SOLN: INTRAVENOUS | Status: DC

## 2018-07-24 SURGICAL SUPPLY — 39 items
BANDAGE ELASTIC 6 LF NS (GAUZE/BANDAGES/DRESSINGS) ×3 IMPLANT
BLADE SAGITTAL WIDE XTHICK NO (BLADE) ×1 IMPLANT
BLADE SAW SAG 25.4X90 (BLADE) ×2 IMPLANT
BNDG COHESIVE 4X5 TAN STRL (GAUZE/BANDAGES/DRESSINGS) ×3 IMPLANT
BNDG GAUZE 4.5X4.1 6PLY STRL (MISCELLANEOUS) ×6 IMPLANT
BRUSH SCRUB EZ  4% CHG (MISCELLANEOUS) ×4
BRUSH SCRUB EZ 4% CHG (MISCELLANEOUS) ×1 IMPLANT
CANISTER SUCT 1200ML W/VALVE (MISCELLANEOUS) ×3 IMPLANT
COVER WAND RF STERILE (DRAPES) ×1 IMPLANT
DRAIN PENROSE 1/4X12 LTX (DRAIN) ×3 IMPLANT
DRAPE INCISE IOBAN 66X45 STRL (DRAPES) ×2 IMPLANT
DURAPREP 26ML APPLICATOR (WOUND CARE) ×3 IMPLANT
ELECT CAUTERY BLADE 6.4 (BLADE) ×3 IMPLANT
ELECT REM PT RETURN 9FT ADLT (ELECTROSURGICAL) ×3
ELECTRODE REM PT RTRN 9FT ADLT (ELECTROSURGICAL) ×1 IMPLANT
GAUZE PETRO XEROFOAM 1X8 (MISCELLANEOUS) ×6 IMPLANT
GLOVE BIO SURGEON STRL SZ7 (GLOVE) ×6 IMPLANT
GLOVE INDICATOR 7.5 STRL GRN (GLOVE) ×3 IMPLANT
GOWN STRL REUS W/ TWL LRG LVL3 (GOWN DISPOSABLE) ×2 IMPLANT
GOWN STRL REUS W/ TWL XL LVL3 (GOWN DISPOSABLE) ×1 IMPLANT
GOWN STRL REUS W/TWL LRG LVL3 (GOWN DISPOSABLE) ×4
GOWN STRL REUS W/TWL XL LVL3 (GOWN DISPOSABLE) ×2
HANDLE YANKAUER SUCT BULB TIP (MISCELLANEOUS) ×3 IMPLANT
KIT TURNOVER KIT A (KITS) ×3 IMPLANT
LABEL OR SOLS (LABEL) ×1 IMPLANT
NS IRRIG 1000ML POUR BTL (IV SOLUTION) ×3 IMPLANT
PACK EXTREMITY ARMC (MISCELLANEOUS) ×3 IMPLANT
PAD ABD DERMACEA PRESS 5X9 (GAUZE/BANDAGES/DRESSINGS) ×6 IMPLANT
PAD PREP 24X41 OB/GYN DISP (PERSONAL CARE ITEMS) ×3 IMPLANT
SPONGE LAP 18X18 RF (DISPOSABLE) ×3 IMPLANT
STAPLER SKIN PROX 35W (STAPLE) ×3 IMPLANT
STOCKINETTE M/LG 89821 (MISCELLANEOUS) ×3 IMPLANT
SUT SILK 2 0 (SUTURE) ×2
SUT SILK 2 0 SH (SUTURE) ×6 IMPLANT
SUT SILK 2-0 18XBRD TIE 12 (SUTURE) ×1 IMPLANT
SUT SILK 3 0 (SUTURE) ×2
SUT SILK 3-0 18XBRD TIE 12 (SUTURE) ×1 IMPLANT
SUT VIC AB 0 CT1 36 (SUTURE) ×6 IMPLANT
SUT VIC AB 2-0 CT1 (SUTURE) ×6 IMPLANT

## 2018-07-24 NOTE — H&P (Signed)
[facility] History & Physical Update  The patient was interviewed and re-examined.  The patient's previous History and Physical has been reviewed and is unchanged.  There is no change in the plan of care. We plan to proceed with the scheduled procedure.  Festus Barren, MD  07/24/2018, 11:05 AM

## 2018-07-24 NOTE — Op Note (Signed)
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 female who presents with left heel/calcaneus osteomyelitis making her left foot not salvageable.  She had severe vascular disease but has been reconstructed and should be able to heal in a below-knee level at this point.  Her right foot still has ulcerations that are being treated.  The patient is scheduled for a left below-the-knee amputation.  I discussed in depth with the patient the risks, benefits, and alternatives to this procedure.  The patient is aware that the risk of this operation included but are not limited to:  bleeding, infection, myocardial infarction, stroke, death, failure to heal amputation wound, and possible need for more proximal amputation.  The patient is aware of the risks and agrees proceed forward with the procedure. An assistant was present during the procedure to help facilitate the exposure and expedite the procedure.  DESCRIPTION:  After full informed written consent was obtained from the patient, the patient was brought back to the operating room, and placed supine upon the operating table.  Prior to induction, the patient received IV antibiotics.  The patient was then prepped and draped in the standard fashion for a below-the-knee amputation.  After obtaining adequate anesthesia, the patient was prepped and draped in the standard fashion for a left below-the-knee amputation. The assistant provided retraction and mobilization to help facilitate exposure and expedite the procedure throughout the  entire procedure.  This included following suture, using retractors, and optimizing lighting. I marked out the anterior incision two finger breadths below the tibial tuberosity and then the marked out a posterior flap that was one third of the circumference of the calf in length.   I made the incisions for these flaps, and then dissected through the subcutaneous tissue, fascia, and muscle anteriorly.  I elevated  the periosteal tissue superiorly so that the tibia was about 3-4 cm shorter than the anterior skin flap.  I then transected the tibia with a power saw and then took a wedge off the tibia anteriorly with the power saw.  Then I smoothed out the rough edges.  In a similar fashion, I cut back the fibula about two centimeters higher than the level of the tibia with a bone cutter.  I put a bone hook into the distal tibia and then used a large amputation knife to sharply develop a tissue plane through the muscle along the fibula.  In such fashion, the posterior flap was developed.  At this point, the specimen was passed off the field as the below-the-knee amputation.  At this point, I clamped all visibly bleeding arteries and veins using a combination of suture ligation with Silk suture and electrocautery.  Bleeding continued to be controlled with electrocautery and suture ligature.  The stump was washed off with sterile normal saline and no further active bleeding was noted.  I reapproximated the anterior and posterior fascia  with interrupted stitches of 0 Vicryl.  This was completed along the entire length of anterior and posterior fascia until there were no more loose space in the fascial line. I then placed a layer of 2-0 Vicryl sutures in the subcutaneous tissue. The  skin was then  reapproximated with staples.  The stump was washed off and dried.  The incision was dressed with Xeroform and  then fluffs were applied.  Kerlix was wrapped around the leg and then gently an ACE wrap was applied.     COMPLICATIONS: none  CONDITION: stable   Festus Barren  07/24/2018, 12:26 PM    This note was created with Dragon Medical transcription system. Any errors in dictation are purely unintentional.

## 2018-07-24 NOTE — Progress Notes (Signed)
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  Allergies  Allergen Reactions  . Eggs Or Egg-Derived Products Diarrhea, Itching and Nausea And Vomiting    Patient Measurements: Height: 5\' 2"  (157.5 cm) Weight: 106 lb 14.8 oz (48.5 kg) IBW/kg (Calculated) : 50.1 Heparin Dosing Weight: 36.6 kg - reported by Jacqulyn Cane RN  Vital Signs: Temp: 98.1 F (36.7 C) (01/22 2133) Temp Source: Oral (01/22 2133) BP: 107/51 (01/22 2329) Pulse Rate: 84 (01/22 2329)  Labs: Recent Labs    07/22/18 0456 07/23/18 0441 07/24/18 0448  HGB 9.1* 9.7* 10.2*  HCT 30.2* 31.1* 33.4*  PLT 211 241 262  LABPROT  --   --  15.3*  INR  --   --  1.22  HEPARINUNFRC 0.40 0.38 0.45  CREATININE 0.76 0.64 0.61    Estimated Creatinine Clearance: 55.1 mL/min (by C-G formula based on SCr of 0.61 mg/dL).   Assessment: Pharmacy was consulted for heparin.  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 Will follow aPTT.   Goal of Therapy:  APTT level 66-102 seconds or Heparin level 0.3-0.7 units/ml once heparin level correlates with aPTT  Monitor platelets by anticoagulation protocol: Yes  Heparin Course: 1/13 AM heparin level 0.24. 600 unit  bolus and increase rate t o 850 units/hr. Recheck in 6 hours.  1/13:  HL @ 2055 = 0.17 .   Heparin was not held during any point on 1/13, angiogram was not done.   Will order heparin 1750 units IV X 1 once and increase drip rate to 1050 units/hr.  Will recheck HL 6 hrs after rate change.  1/14 Patient had angiogram with heparin on hold. 01/17 @ 0200 HL 0.41 therapeutic. Will continue current rate and will recheck HL @ 0800, Hgb now below 7 @ 6.5 will continue to monitor.  01/17 @ 0500 patient's heparin drip to be held for central line placement will retime HL for 1200. 01/18 @ 0100 HL 0.44 therapeutic. Will  continue current rate and will recheck HL @ 0700. Patient is s/p 2 transfusions w/ a hgb of 9.5 mg/dL will continue to monitor. 01/19 @ 0500 HL 0.84 supratherapeutic. Will decrease rate to 1150 units/hr and will recheck @ 1300, will continue monitoring CBC.  Plan:  1/20 0200 HL= 0.50. Will continue current rate of heparin 1100 units/hr and f/u w/ am labs. CBC stable will continue to monitor.  1/21 AM heparin level 0.40. Continue current regimen. Recheck heparin level and CBC with tomorrow AM labs.  1/22 AM heparin level 0.38. Continue current regimen. Recheck heparin level and CBC with tomorrow AM labs.  1/23 AM heparin level 0.45. Continue current regimen. Recheck heparin level and CBC with tomorrow AM labs.  Erich Montane, PharmD Clinical Pharmacist 07/24/2018,6:24 AM

## 2018-07-24 NOTE — Anesthesia Procedure Notes (Signed)
Procedure Name: LMA Insertion Date/Time: 07/24/2018 11:28 AM Performed by: Omer Jack, CRNA Pre-anesthesia Checklist: Patient identified, Patient being monitored, Timeout performed, Emergency Drugs available and Suction available Patient Re-evaluated:Patient Re-evaluated prior to induction Oxygen Delivery Method: Circle system utilized Preoxygenation: Pre-oxygenation with 100% oxygen Induction Type: IV induction Ventilation: Mask ventilation without difficulty LMA: LMA inserted LMA Size: 3.0 Tube type: Oral Number of attempts: 1 Placement Confirmation: positive ETCO2 and breath sounds checked- equal and bilateral Tube secured with: Tape Dental Injury: Teeth and Oropharynx as per pre-operative assessment

## 2018-07-24 NOTE — Anesthesia Post-op Follow-up Note (Signed)
Anesthesia QCDR form completed.        

## 2018-07-24 NOTE — Anesthesia Postprocedure Evaluation (Signed)
Anesthesia Post Note  Patient: April [name]  Procedure(s) Performed: AMPUTATION BELOW KNEE (Left )  Patient location during evaluation: PACU Anesthesia Type: General Level of consciousness: awake and alert Pain management: pain level controlled Vital Signs Assessment: post-procedure vital signs reviewed and stable Respiratory status: spontaneous breathing, nonlabored ventilation, respiratory function stable and patient connected to nasal cannula oxygen Cardiovascular status: blood pressure returned to baseline and stable Postop Assessment: no apparent nausea or vomiting Anesthetic complications: no     Last Vitals:  Vitals:   07/24/18 1521 07/24/18 1648  BP: 128/61 125/66  Pulse: (!) 110 (!) 113  Resp: 18 17  Temp: 37.4 C 37 C  SpO2: 100% 97%    Last Pain:  Vitals:   07/24/18 1648  TempSrc: Oral  PainSc:                  Lyna Laningham S

## 2018-07-24 NOTE — Consult Note (Signed)
Consult History and Physical   SERVICE: Gynecology   Patient Name: April [name] [facility], [facility], [facility] Patient MRN:   [id number]  CC: "white discharge with erythematous/edematous labia"  HPI: April [name] is a 63 y.o. woman with reports of white discharge with erythematous and edematous labia that were noted while prepping her for surgery this morning. Patient is unable to provide further history or information as to when swelling started or if she is experiencing any vaginal, vulvar, or perineal discomfort. Bedside RN states that she was not informed by previous shift RN as to any issues with patient's bottom.   Review of Systems: positives in bold Patient unable to discuss recent symptoms due to fatigue, recent anesthesia, and potentially pain medication. Positives per MD and RN report.  GEN:   fatigue GU:  vulvar swelling, vaginal discharge  Past Gynecologic History: No LMP recorded. Patient is postmenopausal.   Past Medical History: Past Medical History:  Diagnosis Date  . Anxiety   . Colon polyps   . COPD (chronic obstructive pulmonary disease) (HCC)   . Diabetes mellitus without complication (HCC)   . DVT (deep venous thrombosis) (HCC)   . DVT (deep venous thrombosis) (HCC)   . History of colonic polyps   . Hyperlipidemia   . Peripheral artery disease (HCC)   . Pulmonary nodules/lesions, multiple 08/2012   NEGATIVE PET SCAN  . Retroperitoneal abscess (HCC)    history of in 04/2013  . Tobacco abuse     Past Surgical History:   Past Surgical History:  Procedure Laterality Date  . APPLICATION OF WOUND VAC Right 07/11/2018   Procedure: APPLICATION OF WOUND VAC;  Surgeon: Gwyneth Revels, DPM;  Location: [facility] ORS;  Service: Podiatry;  Laterality: Right;  . CHOLECYSTECTOMY    . COLONOSCOPY  05/2007  . ENDARTERECTOMY FEMORAL Bilateral 07/17/2018   Procedure: ENDARTERECTOMY FEMORAL - BILATERAL SFA STENT PLACEMENT - LEFT;  Surgeon: Annice Needy, MD;  Location: [facility] ORS;  Service: Vascular;   Laterality: Bilateral;  . IRRIGATION AND DEBRIDEMENT FOOT Bilateral 05/25/2018   Procedure: IRRIGATION AND DEBRIDEMENT FOOT application wound vac left  foot;  Surgeon: Recardo Evangelist, DPM;  Location: [facility] ORS;  Service: Podiatry;  Laterality: Bilateral;  . IRRIGATION AND DEBRIDEMENT FOOT Right 07/11/2018   Procedure: FOOT DEBRIDEMENT;  Surgeon: Gwyneth Revels, DPM;  Location: [facility] ORS;  Service: Podiatry;  Laterality: Right;  . LOWER EXTREMITY ANGIOGRAPHY Left 05/26/2018   Procedure: Lower Extremity Angiography with possible intervention;  Surgeon: Annice Needy, MD;  Location: [facility] INVASIVE CV LAB;  Service: Cardiovascular;  Laterality: Left;  . LOWER EXTREMITY ANGIOGRAPHY Right 05/27/2018   Procedure: Lower Extremity Angiography;  Surgeon: Renford Dills, MD;  Location: [facility] INVASIVE CV LAB;  Service: Cardiovascular;  Laterality: Right;  . LOWER EXTREMITY ANGIOGRAPHY Right 07/15/2018   Procedure: Lower Extremity Angiography;  Surgeon: Renford Dills, MD;  Location: [facility] INVASIVE CV LAB;  Service: Cardiovascular;  Laterality: Right;    Family History:  family history includes Brain cancer in her mother.  Social History:  Social History   Socioeconomic History  . Marital status: Divorced    Spouse name: Not on file  . Number of children: Not on file  . Years of education: Not on file  . Highest education level: Not on file  Occupational History  . Not on file  Social Needs  . Financial resource strain: Not on file  . Food insecurity:    Worry: Not on file    Inability: Not on file  .  Transportation needs:    Medical: Not on file    Non-medical: Not on file  Tobacco Use  . Smoking status: Current Every Day Smoker    Packs/day: 1.50    Years: 47.00    Pack years: 70.50    Types: Cigarettes  . Smokeless tobacco: Never Used  . Tobacco comment: Smoking History 2.5-started at age 82  Substance and Sexual Activity  . Alcohol use: No    Alcohol/week: 0.0 standard drinks   . Drug use: No  . Sexual activity: Not on file  Lifestyle  . Physical activity:    Days per week: Not on file    Minutes per session: Not on file  . Stress: Not on file  Relationships  . Social connections:    Talks on phone: Not on file    Gets together: Not on file    Attends religious service: Not on file    Active member of club or organization: Not on file    Attends meetings of clubs or organizations: Not on file    Relationship status: Not on file  . Intimate partner violence:    Fear of current or ex partner: Not on file    Emotionally abused: Not on file    Physically abused: Not on file    Forced sexual activity: Not on file  Other Topics Concern  . Not on file  Social History Narrative  . Not on file    Home Medications:  Medications reconciled in EPIC  No current facility-administered medications on file prior to encounter.    Current Outpatient Medications on File Prior to Encounter  Medication Sig Dispense Refill  . albuterol (PROVENTIL HFA;VENTOLIN HFA) 108 (90 Base) MCG/ACT inhaler Inhale 2 puffs into the lungs every 6 (six) hours as needed for wheezing or shortness of breath.    Marland Kitchen albuterol (PROVENTIL) (2.5 MG/3ML) 0.083% nebulizer solution Inhale 2.5 mg into the lungs every 6 (six) hours as needed for wheezing or shortness of breath.     . ALPRAZolam (XANAX) 0.5 MG tablet Take 1 tablet (0.5 mg total) by mouth 2 (two) times daily as needed for anxiety. 30 tablet 0  . aspirin 81 MG chewable tablet Chew 81 mg by mouth daily.    Marland Kitchen atorvastatin (LIPITOR) 40 MG tablet Take 40 mg by mouth daily.    . ferrous sulfate 325 (65 FE) MG tablet Take 1 tablet (325 mg total) by mouth 2 (two) times daily with a meal. 60 tablet 0  . Fluticasone-Salmeterol (ADVAIR DISKUS) 250-50 MCG/DOSE AEPB Inhale 1 puff into the lungs 2 (two) times daily. 1 each 0  . furosemide (LASIX) 40 MG tablet Take 1 tablet (40 mg total) by mouth daily. 30 tablet 0  . guaiFENesin-dextromethorphan  (ROBITUSSIN DM) 100-10 MG/5ML syrup Take 5 mLs by mouth every 4 (four) hours as needed for cough. 118 mL 0  . HYDROcodone-acetaminophen (NORCO/VICODIN) 5-325 MG tablet Take 1-2 tablets by mouth every 6 (six) hours as needed for moderate pain or severe pain. 20 tablet 0  . insulin glargine (LANTUS) 100 unit/mL SOPN Inject 0.1 mLs (10 Units total) into the skin daily. 15 mL 1  . metFORMIN (GLUCOPHAGE) 500 MG tablet Take 1 tablet (500 mg total) by mouth 2 (two) times daily with a meal. 60 tablet 0  . pantoprazole (PROTONIX) 40 MG tablet Take 40 mg by mouth 2 (two) times daily.     . polyethylene glycol (MIRALAX / GLYCOLAX) packet Take 17 g by mouth daily. 14  each 0  . potassium chloride (K-DUR,KLOR-CON) 10 MEQ tablet Take 1 tablet (10 mEq total) by mouth daily. 30 tablet 0  . protein supplement shake (PREMIER PROTEIN) LIQD Take 325 mLs (11 oz total) by mouth 2 (two) times daily between meals. 11 Can 0  . rivaroxaban (XARELTO) 10 MG TABS tablet Take 10 mg by mouth every evening.    Marland Kitchen. SANTYL ointment Apply 1 application topically daily.     . sodium hypochlorite (DAKIN'S 1/4 STRENGTH) 0.125 % SOLN Apply 1 application topically daily. Use as a wound cleanser, pat dry, then apply dressings    . SPIRIVA HANDIHALER 18 MCG inhalation capsule Place 18 mcg into inhaler and inhale daily.     . tamsulosin (FLOMAX) 0.4 MG CAPS capsule Take 1 capsule (0.4 mg total) by mouth daily. 30 capsule 0  . vitamin C (VITAMIN C) 500 MG tablet Take 1 tablet (500 mg total) by mouth 2 (two) times daily. 60 tablet 0    Allergies:  Allergies  Allergen Reactions  . Eggs Or Egg-Derived Products Diarrhea, Itching and Nausea And Vomiting    Physical Exam:  Temp:  [98 F (36.7 C)-100.5 F (38.1 C)] 98.6 F (37 C) (01/23 1648) Pulse Rate:  [80-117] 113 (01/23 1648) Resp:  [12-18] 17 (01/23 1648) BP: (103-136)/(51-79) 125/66 (01/23 1648) SpO2:  [92 %-100 %] 97 % (01/23 1648) Weight:  [48.5 kg] 48.5 kg (01/23  0500)   General Appearance:  Asleep, appears undernourished and cachetctic, easily arousable but does not stay awake for more than a minute, cooperative HEENT:  Hard of hearing Pulmonary:  Respirations unlabored Extremities:  Wound VAC to right foot, left leg with 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,  Skin:  Would VAC to right foot with dressing in place, left leg with ACE wrap over BKA incisions, b/l groin cutdowns OTA with staples in place, mild ecchymosis to L grown cutdown  Psychiatric:  Normal mood and affect while awake Pelvic:  NEFG, moderate to severe vulvar edema, mild vulvar erythema, scant white to yellow thin discharge present at vaginal introitus, internal exam deferred due to patient's state of intermittent consciousness and inability to give consent  Labs/Studies:  CBC and Coags:  Lab Results  Component Value Date   WBC 12.4 (H) 07/24/2018   NEUTOPHILPCT 75 07/19/2018   EOSPCT 1 07/19/2018   BASOPCT 0 07/19/2018   LYMPHOPCT 16 07/19/2018   HGB 10.2 (L) 07/24/2018   HCT 33.4 (L) 07/24/2018   MCV 94.6 07/24/2018   PLT 262 07/24/2018   INR 1.22 07/24/2018   CMP:  Lab Results  Component Value Date   NA 136 07/24/2018   K 4.1 07/24/2018   CL 100 07/24/2018   CO2 31 07/24/2018   BUN 19 07/24/2018   CREATININE 0.61 07/24/2018   CREATININE 0.64 07/23/2018   CREATININE 0.76 07/22/2018   PROT 5.9 (L) 06/10/2018   BILITOT 0.7 06/10/2018   BILIDIR 0.2 06/10/2018   ALT 28 06/10/2018   AST 23 06/10/2018   ALKPHOS 149 (H) 06/10/2018   Other Imaging: Dg Chest 1 View  Result Date: 07/20/2018 CLINICAL DATA:  Pitting edema.  History of pneumonia. EXAM: CHEST  1 VIEW COMPARISON:  07/19/2018 FINDINGS: Stable position of the right jugular central line with the tip in the SVC region. Heart size is within normal limits and stable. Hazy interstitial densities in the lower lungs are similar to the recent comparison examination. Question slightly increased densities in the right  perihilar region. Negative for a  pneumothorax. IMPRESSION: Slightly increased densities in the right perihilar region are nonspecific could be related to overlying structures. Difficult to exclude slightly increased interstitial edema. Persistent patchy and hazy densities at the lung bases are suggestive for pleural fluid and airspace disease/atelectasis. Stable position of the central line. Electronically Signed   By: Richarda Overlie M.D.   On: 07/20/2018 12:42   Dg Chest 1 View  Result Date: 07/18/2018 CLINICAL DATA:  Status post central line placement EXAM: CHEST  1 VIEW COMPARISON:  07/18/2018 FINDINGS: Cardiac shadow is stable. New right jugular central line is noted with the catheter tip in the distal superior vena cava. No pneumothorax is seen. Patchy infiltrative changes again noted in the right upper lobe. Mild hyperinflation is again noted. IMPRESSION: No pneumothorax following central line placement. Patchy infiltrate in the right upper lobe. Electronically Signed   By: Alcide Clever M.D.   On: 07/18/2018 07:21   Mr Ankle Left Wo Contrast  Result Date: 07/18/2018 CLINICAL DATA:  Calcaneal osteomyelitis.  Follow-up. EXAM: MRI OF THE LEFT ANKLE WITHOUT CONTRAST TECHNIQUE: Multiplanar, multisequence MR imaging of the ankle was performed. No intravenous contrast was administered. COMPARISON:  Foot radiographs 07/20/2018. Left forefoot MRI 05/24/2018. FINDINGS: Previous examination included most of the foot, but not the calcaneal tuberosity. TENDONS Peroneal: Intact and normally positioned. Posteromedial: Intact and normally positioned. Trace fluid in the posterior tibialis tendon sheath. Anterior: Intact and normally positioned. Achilles: Intact.  Trace fluid in the retrocalcaneal bursa. Plantar Fascia: Intact. LIGAMENTS Lateral: The anterior and posterior talofibular and calcaneofibular ligaments are intact. Medial: The deltoid and visualized portions of the spring ligament appear intact. CARTILAGE AND  BONES Ankle Joint: No significant ankle joint effusion. The talar dome and tibial plafond are intact. Subtalar Joints/Sinus Tarsi: Unremarkable. Bones: There is a large area of soft tissue ulceration posterior to the calcaneal tuberosity. There is underlying prominent marrow T2 hyperintensity in the calcaneal tuberosity. In addition, there is associated decreased T1 signal, but no definite cortical destruction. The additional bones appear unremarkable. Other: As above, large area of soft tissue ulceration posterior to the calcaneal tuberosity without underlying fluid collection. There is diffuse subcutaneous edema surrounding the ankle and extending into the dorsum of the midfoot and forefoot. No focal fluid collections are seen. 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 IMPRESSION: 1. Large area of soft tissue ulceration over the posterior aspect of the heel with underlying marrow T2 hyperintensity and T1 hypointensity in the calcaneal tuberosity consistent with osteomyelitis. This area was not previously imaged. 2. Generalized cellulitis and muscular T2 hyperintensity. No focal fluid collection. 3. No significant ankle tendon or ligament pathology. Electronically Signed   By: Carey Bullocks M.D.   On: 07/18/2018 14:51   Dg Chest Port 1 View  Result Date: 07/19/2018 CLINICAL DATA:  Recent pneumonia EXAM: PORTABLE CHEST 1 VIEW COMPARISON:  January 16, 2019 FINDINGS: Central catheter tip is in superior vena cava. No pneumothorax. There is a small pleural effusion on each side with right base atelectasis. There is no airspace consolidation currently. Heart is upper normal in size with pulmonary vascularity normal. There is aortic atherosclerosis. Bones are osteoporotic. IMPRESSION: Central catheter tip in superior vena cava. No pneumothorax. Paragraphs pleural effusions bilaterally with right base atelectasis. No frank consolidation evident currently.  Subtle opacity in the right upper lobe seen 1 day prior is no longer appreciable. Stable cardiac silhouette.  There is aortic atherosclerosis. Aortic Atherosclerosis (ICD10-I70.0). Electronically Signed   By: Chrissie Noa  Margarita GrizzleWoodruff III M.D.   On: 07/19/2018 08:30   Dg Chest Port 1 View  Result Date: 07/18/2018 CLINICAL DATA:  Leukocytosis EXAM: PORTABLE CHEST 1 VIEW COMPARISON:  07/16/2018 FINDINGS: Mild central right upper lobe opacity, suspicious for pneumonia. Lungs are otherwise clear. No pleural effusion or pneumothorax. The heart is normal in size. IMPRESSION: Mild right upper lobe opacity, suspicious for pneumonia. Electronically Signed   By: Charline BillsSriyesh  Krishnan M.D.   On: 07/18/2018 03:15   Dg Chest Port 1 View  Result Date: 07/16/2018 CLINICAL DATA:  Shortness of breath, coughing up yellow sputum, history COPD, diabetes mellitus, smoker EXAM: PORTABLE CHEST 1 VIEW COMPARISON:  Portable exam 1416 hours compared to 06/11/2018 FINDINGS: Normal heart size and mediastinal contours. Question mild pulmonary vascular congestion. Atherosclerotic calcification aorta. Diffuse interstitial prominence and scattered Kerley B-lines at the mid to lower lungs question pulmonary edema. Decreased bibasilar effusions and atelectasis versus previous exam. Question subtle developing infiltrate RIGHT upper lobe. No pneumothorax. Bones demineralized. IMPRESSION: Question subtle RIGHT upper lobe infiltrate. Improved bibasilar pleural effusions and atelectasis. Persistent interstitial prominence in mid to lower lungs suggesting minimal pulmonary edema. Electronically Signed   By: Ulyses SouthwardMark  Boles M.D.   On: 07/16/2018 14:39   Dg Foot Complete Right  Result Date: 07/10/2018 CLINICAL DATA:  Foot ulcer. EXAM: RIGHT FOOT COMPLETE - 3+ VIEW COMPARISON:  06/03/2018 FINDINGS: Stable postsurgical resection of the distal first metatarsal bone. Again seen is irregularity of the base of the first proximal phalanx possibly due to ongoing  osteomyelitis. 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 Cortical irregularity of the remaining metatarsal bone is stable from November 2019. There is a deep penetrating wound in the plantar foot at the level of the first metatarsophalangeal joint. IMPRESSION: Probable surgical resection of the distal first metatarsal bone. Deep penetrating wound at the level of the first metatarsophalangeal joint, with cortical irregularity of the base of the first proximal phalanx, suggestive possible osteomyelitis. Electronically Signed   By: Ted Mcalpineobrinka  Dimitrova M.D.   On: 07/10/2018 14:27   Dg C-arm 1-60 Min-no Report  Result Date: 07/17/2018 Fluoroscopy was utilized by the requesting physician.  No radiographic interpretation.    Assessment / Plan:   April [name] is a 63 y.o. woman who presents with white discharge with erythematous/edematous labia   1. Vaginal discharge: Small amount of vaginal discharge noted on external exam. Sample taken and sent for wet prep. Will treat for vaginitis if indicated by results.   2. Vulvar edema: Moderate to severe vulvar edema present, with mild vulvar erythema. Likely related to vascular compromise, IV fluid and medication administration, and limited mobility. Recommend cool application. If edema persists or other symptoms occur, would recommend outpatient follow-up with GYN.    Thank you for the opportunity to be involved with this patient's care.  ----- Genia DelMargaret Ambri Miltner, CNM Midwife [facility], Department of OB/GYN [facility]

## 2018-07-24 NOTE — Progress Notes (Signed)
PT Cancellation Note  Patient Details Name: April [name] MRN: [id number] DOB: 06-17-1956   Cancelled Treatment:    Reason Eval/Treat Not Completed: Patient at procedure or test/unavailable   Pt out of room for scheduled amputation this am.  Will complete PT orders at this time and await new orders as appropriate.     Danielle Dess 07/24/2018, 11:46 AM

## 2018-07-24 NOTE — Progress Notes (Signed)
Pts heparin drip stopped at 0830.

## 2018-07-24 NOTE — Progress Notes (Addendum)
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  Allergies  Allergen Reactions  . Eggs Or Egg-Derived Products Diarrhea, Itching and Nausea And Vomiting    Patient Measurements: Height: 5\' 2"  (157.5 cm) Weight: 106 lb 14.8 oz (48.5 kg) IBW/kg (Calculated) : 50.1 Heparin Dosing Weight: 36.6 kg - reported by Jacqulyn Cane RN  Vital Signs: Temp: 99.4 F (37.4 C) (01/23 1242) Temp Source: Oral (01/23 1100) BP: 129/79 (01/23 1357) Pulse Rate: 115 (01/23 1357)  Labs: Recent Labs    07/22/18 0456 07/23/18 0441 07/24/18 0448  HGB 9.1* 9.7* 10.2*  HCT 30.2* 31.1* 33.4*  PLT 211 241 262  LABPROT  --   --  15.3*  INR  --   --  1.22  HEPARINUNFRC 0.40 0.38 0.45  CREATININE 0.76 0.64 0.61    Estimated Creatinine Clearance: 55.1 mL/min (by C-G formula based on SCr of 0.61 mg/dL).   Assessment: Pharmacy was consulted for heparin.  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 Will follow aPTT.   Goal of Therapy:  APTT level 66-102 seconds or Heparin level 0.3-0.7 units/ml once heparin level correlates with aPTT  Monitor platelets by anticoagulation protocol: Yes  Heparin Course: 1/13 AM heparin level 0.24. 600 unit  bolus and increase rate t o 850 units/hr. Recheck in 6 hours.  1/13:  HL 2055 = 0.17 .   Heparin was not held during any point on 1/13, angiogram was not done.   Will order heparin 1750 units IV X 1 once and increase drip rate to 1050 units/hr.  Will recheck HL 6 hrs after rate change.  1/14 angiogram with heparin on hold: restarted at at 1050 units/hr 1/15 1737 HL 0.76 1/16 0526 HL 0.21: rate increased to 1150 units/hr 01/17 0200 HL 0.41  01/17 0500 drip held for central line placement will retime HL for 1200. 1/17 1751 HL 0.23: rate increased to 1250 units/hr 1/18 0100 HL 0.44 1/19  0500 HL 0.84: rate to  1100 units/hr 1/20 0200 HL 0.50 1/21 AM HL 0.40 1/22 AM HL 0.38 1/23 AM HL 0.45: held for BKA at 0645  Plan: Heparin is to be restarted at 0700 on 1/24 at the previous rate of 1100 units/hr per surgical team. We will obtain a heparin level 6 hours after heparin is restarted and change the rate if necessary.  Lowella Bandy, PharmD Clinical Pharmacist 07/24/2018,2:04 PM

## 2018-07-24 NOTE — Progress Notes (Signed)
[facility] [place] [facility]   PATIENT NAME: April [name]    MR#:  [id number]  DATE OF BIRTH:  03-04-1956  SUBJECTIVE:   Patient is plan for surgery this morning.  She is anxious.  No acute events overnight. Boyfriend is at bedside  REVIEW OF SYSTEMS:    Review of Systems  Constitutional: Negative for fever, chills weight loss HENT: Negative for ear pain, nosebleeds, congestion, facial swelling, rhinorrhea, neck pain, neck stiffness and ear discharge.    Hard of hearing   respiratory: Negative for cough, shortness of breath, wheezing  Cardiovascular: Negative for chest pain, palpitations and leg swelling.  Gastrointestinal: Negative for heartburn, abdominal pain, vomiting, diarrhea or consitpation Genitourinary: Negative for dysuria, urgency, frequency, hematuria Musculoskeletal: Negative for back pain or joint pain Neurological: Negative for dizziness, seizures, syncope, focal weakness,  numbness and headaches.  Hematological: Does not bruise/bleed easily.  Psychiatric/Behavioral: Negative for hallucinations, confusion, dysphoric mood  Right foot infection and left heel ulceration  Tolerating Diet: npo     DRUG ALLERGIES:   Allergies  Allergen Reactions  . Eggs Or Egg-Derived Products Diarrhea, Itching and Nausea And Vomiting    VITALS:  Blood pressure (!) 120/59, pulse 93, temperature 98.7 F (37.1 C), temperature source Oral, resp. rate 12, height 5\' 2"  (1.575 m), weight 48.5 kg, SpO2 93 %.  PHYSICAL EXAMINATION:  Constitutional: Appears well-developed and well-nourished. No distress. HENT: Normocephalic. Marland Kitchen. Oropharynx is clear and moist.  Eyes: Conjunctivae and EOM are normal. PERRLA, no scleral icterus.  Neck: Normal ROM. Neck supple. No JVD. No tracheal deviation. CVS: RRR, S1/S2 +, no murmurs, no gallops, no carotid bruit.  Pulmonary: Effort and breath sounds normal, no stridor, rhonchi, wheezes, rales.  Abdominal: Soft. BS +,  no  distension, tenderness, rebound or guarding.  Musculoskeletal: Normal range of motion. No edema and no tenderness.  Neuro: Alert. CN 2-12 grossly intact. No focal deficits. Skin: Wound VAC to right foot and left heel with eschar Psychiatric: Normal mood and affect.      LABORATORY PANEL:   CBC Recent Labs  Lab 07/24/18 0448  WBC 12.4*  HGB 10.2*  HCT 33.4*  PLT 262   ------------------------------------------------------------------------------------------------------------------  Chemistries  Recent Labs  Lab 07/24/18 0448  NA 136  K 4.1  CL 100  CO2 31  GLUCOSE 231*  BUN 19  CREATININE 0.61  CALCIUM 7.9*  MG 1.6*   ------------------------------------------------------------------------------------------------------------------  Cardiac Enzymes Recent Labs  Lab 07/18/18 0210  TROPONINI 0.40*   ------------------------------------------------------------------------------------------------------------------  RADIOLOGY:  No results found.   ASSESSMENT AND PLAN:   63 year old female with a history of DVT on Xarelto, diabetes and COPD who was directly admitted from podiatry office due to right foot infection and left heel ulceration.  1.  Bilateral infected diabetic foot wounds with right foot osteomyelitis/peripheral vascular disease: Plan for BKA today MRSA from wound culture and ID recommends continuing vancomycin through February 7 and will plan to discontinue Rocephin today after BKA.   2.  Severe PAD bilateral status post endovascular and open intervention with vascular surgery now with osteomyelitis of the left heel and right foot wound VAC: Plan for left BKA which would remove nonviable tissue Continue statin and aspirin   3.  Hypotension: Patient is off of pressors. Continue Midodrine.   4.  Non-STEMI: Patient evaluated by cardiology.  Recommendations for supportive management. Continue aspirin, statin, metoprolol Troponin max was1.19 and  has trended down.   5.Tobacco dependence: Patient is encouraged to quit smoking.  Counseling was provided for 4 minutes.  6.  Urinary retention: Continue tamsulosin  7.  Diabetes: Continue NovoLog, Lantus and sliding scale  8.  History of DVT: Currently on heparin drip. Plan to change to PO tomorrow   management plans discussed with the patient and family and they are in agreement.  CODE STATUS:  DNR  TOTAL TIME TAKING CARE OF THIS PATIENT: 22 minutes.     POSSIBLE D/C 3-5 days, DEPENDING ON CLINICAL CONDITION.   Georganna Maxson M.D on 07/24/2018 at 11:23 AM  Between 7am to 6pm - Pager - [phone] After 6pm go to [email or web address] - password [email or web address]  [facility]  Office  [phone]  CC: Primary care physician; Mickel FuchsWroth, Thomas H, MD  Note: This dictation was prepared with Dragon dictation along with smaller phrase technology. Any transcriptional errors that result from this process are unintentional.

## 2018-07-24 NOTE — Transfer of Care (Signed)
Immediate Anesthesia Transfer of Care Note  Patient: April [name]  Procedure(s) Performed: AMPUTATION BELOW KNEE (Left )  Patient Location: PACU  Anesthesia Type:General  Level of Consciousness: sedated and responds to stimulation  Airway & Oxygen Therapy: Patient Spontanous Breathing and Patient connected to face mask oxygen  Post-op Assessment: Report given to RN and Post -op Vital signs reviewed and stable  Post vital signs: Reviewed and stable  Last Vitals:  Vitals Value Taken Time  BP 121/67 07/24/2018 12:42 PM  Temp    Pulse 107 07/24/2018 12:44 PM  Resp 14 07/24/2018 12:44 PM  SpO2 100 % 07/24/2018 12:44 PM  Vitals shown include unvalidated device data.  Last Pain:  Vitals:   07/24/18 1100  TempSrc: Oral  PainSc:       Patients Stated Pain Goal: 5 (07/24/18 0458)  Complications: No apparent anesthesia complications

## 2018-07-24 NOTE — Discharge Instructions (Signed)
Vascular Surgery Discharge Instructions 1) Please remove OR stump dressing on 07/28/18. 2) Clean stump / incision with normal saline daily and gently pat dry.  3) NO xeroform to incision.  4) Cover stump with dry Kerlix daily. Change more often if drainage is noted.  5) Avoid direct pressure to stump. Encourage elevation of extremity. 6) Encourage flexibility at the left knee joint.  7) Patient may shower on 07/3018.

## 2018-07-25 ENCOUNTER — Encounter: Payer: Self-pay | Admitting: Vascular Surgery

## 2018-07-25 ENCOUNTER — Inpatient Hospital Stay: Payer: Self-pay

## 2018-07-25 DIAGNOSIS — Z89512 Acquired absence of left leg below knee: Secondary | ICD-10-CM

## 2018-07-25 LAB — CBC
HCT: 29.7 % — ABNORMAL LOW (ref 36.0–46.0)
HEMOGLOBIN: 9.3 g/dL — AB (ref 12.0–15.0)
MCH: 29.7 pg (ref 26.0–34.0)
MCHC: 31.3 g/dL (ref 30.0–36.0)
MCV: 94.9 fL (ref 80.0–100.0)
Platelets: 262 10*3/uL (ref 150–400)
RBC: 3.13 MIL/uL — ABNORMAL LOW (ref 3.87–5.11)
RDW: 15.9 % — ABNORMAL HIGH (ref 11.5–15.5)
WBC: 11.3 10*3/uL — ABNORMAL HIGH (ref 4.0–10.5)
nRBC: 0 % (ref 0.0–0.2)

## 2018-07-25 LAB — BASIC METABOLIC PANEL
Anion gap: 3 — ABNORMAL LOW (ref 5–15)
BUN: 22 mg/dL (ref 8–23)
CO2: 34 mmol/L — ABNORMAL HIGH (ref 22–32)
Calcium: 8 mg/dL — ABNORMAL LOW (ref 8.9–10.3)
Chloride: 100 mmol/L (ref 98–111)
Creatinine, Ser: 0.7 mg/dL (ref 0.44–1.00)
GFR calc non Af Amer: >60 mL/min (ref >60)
Glucose, Bld: 277 mg/dL — ABNORMAL HIGH (ref 70–99)
Potassium: 4.7 mmol/L (ref 3.5–5.1)
Sodium: 137 mmol/L (ref 135–145)

## 2018-07-25 LAB — GLUCOSE, CAPILLARY
Glucose-Capillary: 293 mg/dL — ABNORMAL HIGH (ref 70–99)
Glucose-Capillary: 349 mg/dL — ABNORMAL HIGH (ref 70–99)
Glucose-Capillary: 413 mg/dL — ABNORMAL HIGH (ref 70–99)
Glucose-Capillary: 361 mg/dL — ABNORMAL HIGH (ref 70–99)

## 2018-07-25 LAB — MAGNESIUM: Magnesium: 2.3 mg/dL (ref 1.7–2.4)

## 2018-07-25 MED ORDER — GABAPENTIN 100 MG PO CAPS
100.0000 mg | ORAL_CAPSULE | ORAL | Status: AC
  Administered 2018-07-25: 100 mg via ORAL
  Filled 2018-07-25: qty 1

## 2018-07-25 MED ORDER — SODIUM CHLORIDE 0.9% FLUSH: 10.0000 mL | INTRAVENOUS | Status: DC | PRN

## 2018-07-25 MED ORDER — INSULIN GLARGINE 100 UNIT/ML ~~LOC~~ SOLN
15.0000 [IU] | Freq: Every day | SUBCUTANEOUS | Status: DC
  Administered 2018-07-25: 15 [IU] via SUBCUTANEOUS
  Filled 2018-07-25 (×2): qty 0.15

## 2018-07-25 MED ORDER — SODIUM CHLORIDE 0.9 % IV BOLUS
250.0000 mL | Freq: Once | INTRAVENOUS | Status: AC
  Administered 2018-07-25: 250 mL via INTRAVENOUS

## 2018-07-25 MED ORDER — KETOROLAC TROMETHAMINE 30 MG/ML IJ SOLN
30.0000 mg | Freq: Four times a day (QID) | INTRAMUSCULAR | Status: DC
  Administered 2018-07-25 – 2018-07-26 (×6): 30 mg via INTRAVENOUS
  Filled 2018-07-25 (×6): qty 1

## 2018-07-25 MED ORDER — PERMETHRIN 5 % EX CREA
TOPICAL_CREAM | Freq: Once | CUTANEOUS | Status: AC
  Administered 2018-07-25: 13:00:00 via TOPICAL
  Filled 2018-07-25: qty 60

## 2018-07-25 MED ORDER — SODIUM CHLORIDE 0.9% FLUSH
10.0000 mL | INTRAVENOUS | Status: DC | PRN
Start: 2018-07-25 — End: 2018-07-26
  Administered 2018-07-26 (×2): 20 mL
  Filled 2018-07-25 (×2): qty 40

## 2018-07-25 MED ORDER — GABAPENTIN 100 MG PO CAPS
100.0000 mg | ORAL_CAPSULE | Freq: Every day | ORAL | Status: DC
  Administered 2018-07-25: 100 mg via ORAL
  Filled 2018-07-25: qty 1

## 2018-07-25 MED ORDER — HYDROCODONE-ACETAMINOPHEN 5-325 MG PO TABS
1.0000 | ORAL_TABLET | ORAL | Status: DC | PRN
  Administered 2018-07-26: 2 via ORAL
  Filled 2018-07-25 (×2): qty 2

## 2018-07-25 MED ORDER — RIVAROXABAN 10 MG PO TABS
10.0000 mg | ORAL_TABLET | Freq: Every day | ORAL | Status: DC
  Administered 2018-07-25 – 2018-07-26 (×2): 10 mg via ORAL
  Filled 2018-07-25 (×2): qty 1

## 2018-07-25 NOTE — Progress Notes (Signed)
Peripherally Inserted Central Catheter/Midline Placement  The IV Nurse has discussed with the patient and/or persons authorized to consent for the patient, the purpose of this procedure and the potential benefits and risks involved with this procedure.  The benefits include less needle sticks, lab draws from the catheter, and the patient may be discharged home with the catheter. Risks include, but not limited to, infection, bleeding, blood clot (thrombus formation), and puncture of an artery; nerve damage and irregular heartbeat and possibility to perform a PICC exchange if needed/ordered by physician.  Alternatives to this procedure were also discussed.  Bard Power PICC patient education guide, fact sheet on infection prevention and patient information card has been provided to patient /or left at bedside.    PICC/Midline Placement Documentation  PICC Single Lumen 07/25/18 PICC Right Basilic 35 cm 0 cm (Active)  Indication for Insertion or Continuance of Line Home intravenous therapies (PICC only) 07/25/2018  2:50 PM  Exposed Catheter (cm) 0 cm 07/25/2018  2:50 PM  Site Assessment Clean;Dry;Intact 07/25/2018  2:50 PM  Line Status Flushed;Blood return noted;Saline locked 07/25/2018  2:50 PM  Dressing Type Transparent 07/25/2018  2:50 PM  Dressing Status Clean;Dry;Intact;Antimicrobial disc in place 07/25/2018  2:50 PM  Dressing Change Due 08/01/18 07/25/2018  2:50 PM       Audrie Gallus 07/25/2018, 3:22 PM

## 2018-07-25 NOTE — Consult Note (Signed)
PHARMACY CONSULT NOTE FOR:  OUTPATIENT  PARENTERAL ANTIBIOTIC THERAPY (OPAT)  Indication: MRSA rt foot infection/osteomyelitis Regimen: Vancomycin 750 mg IV q24h End date: 08/08/2018  IV antibiotic discharge orders are pended. To discharging provider:  please sign these orders via discharge navigator,  Select New Orders & click on the button choice - Manage This Unsigned Work.     Thank you for allowing pharmacy to be a part of this patient's care.   Mauri Reading, PharmD Pharmacy Resident  07/25/2018 1:04 PM

## 2018-07-25 NOTE — NC FL2 (Signed)
[place] MEDICAID FL2 LEVEL OF CARE SCREENING TOOL     IDENTIFICATION  Patient Name: April [name] Birthdate: May 27, 1956 Sex: female Admission Date (Current Location): 07/09/2018  [place]   Facility and Address:  [facility], [address], [place], [place] [address]      Provider Number: [id number]  Attending Physician Name and Address:  Adrian SaranMody, Sital, MD  Relative Name and Phone Number:       Current Level of Care: Hospital Recommended Level of Care: Skilled Nursing Facility Prior Approval Number:    Date Approved/Denied:   PASRR Number:    Discharge Plan: SNF    Current Diagnoses: Patient Active Problem List   Diagnosis Date Noted  . Diabetic foot infection (HCC) 07/09/2018  . Acute on chronic respiratory failure with hypoxemia (HCC) 06/03/2018  . Acute respiratory failure with hypoxia and hypercapnia (HCC)   . Acute osteomyelitis of right foot (HCC) 05/23/2018  . Protein-calorie malnutrition, severe 04/10/2018  . Diabetes (HCC) 04/08/2018  . COPD (chronic obstructive pulmonary disease) (HCC) 04/08/2018  . HLD (hyperlipidemia) 04/08/2018  . Diabetic foot ulcer (HCC) 04/08/2018  . 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 Elevated CEA 01/30/2014    Orientation RESPIRATION BLADDER Height & Weight     Self, Time, Place  Normal Continent Weight: 103 lb 6.3 oz (46.9 kg) Height:  5\' 2"  (157.5 cm)  BEHAVIORAL SYMPTOMS/MOOD NEUROLOGICAL BOWEL NUTRITION STATUS  (none) (none) Continent Diet  AMBULATORY STATUS COMMUNICATION OF NEEDS Skin   Extensive Assist Verbally Surgical wounds(will require wound vac post discharge)                       Personal Care Assistance Level of Assistance  Bathing, Dressing Bathing Assistance: Limited assistance   Dressing Assistance: Limited assistance     Functional Limitations Info             SPECIAL CARE FACTORS  FREQUENCY  PT (By licensed PT)                    Contractures Contractures Info: Not present    Additional Factors Info  Code Status, Isolation Precautions               Current Medications (07/25/2018):  This is the current hospital active medication list Current Facility-Administered Medications  Medication Dose Route Frequency Provider Last Rate Last Dose  . 0.9 %  sodium chloride infusion   Intravenous PRN Annice Needyew, Jason S, MD 10 mL/hr at 07/21/18 1822    . acetaminophen (TYLENOL) tablet 650 mg  650 mg Oral Q6H PRN Annice Needyew, Jason S, MD   650 mg at 07/16/18 1710  . albuterol (PROVENTIL) (2.5 MG/3ML) 0.083% nebulizer solution 2.5 mg  2.5 mg Inhalation Q6H PRN Annice Needyew, Jason S, MD   2.5 mg at 07/16/18 1609  . ALPRAZolam Prudy Feeler(XANAX) tablet 0.5 mg  0.5 mg Oral BID PRN Annice Needyew, Jason S, MD      . aspirin EC tablet 81 mg  81 mg Oral Daily Annice Needyew, Jason S, MD   81 mg at 07/25/18 0931  . atorvastatin (LIPITOR) tablet 40 mg  40 mg Oral q1800 Annice Needyew, Jason S, MD   40 mg at 07/24/18 1720  . ferrous sulfate tablet 325 mg  325 mg Oral BID WC Annice Needyew, Jason S, MD   325 mg at 07/25/18 0757  . furosemide (LASIX) tablet 40 mg  40 mg Oral  Daily Annice Needy, MD   40 mg at 07/25/18 0931  . guaiFENesin-dextromethorphan (ROBITUSSIN DM) 100-10 MG/5ML syrup 5 mL  5 mL Oral Q4H PRN Annice Needy, MD      . HYDROcodone-acetaminophen (NORCO/VICODIN) 5-325 MG per tablet 1-2 tablet  1-2 tablet Oral Q6H PRN Annice Needy, MD   2 tablet at 07/24/18 1914  . insulin aspart (novoLOG) injection 0-15 Units  0-15 Units Subcutaneous TID WC Annice Needy, MD   8 Units at 07/25/18 0757  . insulin aspart (novoLOG) injection 0-5 Units  0-5 Units Subcutaneous QHS Annice Needy, MD   2 Units at 07/24/18 2152  . insulin aspart (novoLOG) injection 3 Units  3 Units Subcutaneous TID WC Annice Needy, MD   3 Units at 07/25/18 0756  . insulin glargine (LANTUS) injection 15 Units  15 Units Subcutaneous QHS Mody, Sital, MD      . ketorolac (TORADOL) 15 MG/ML  injection 15 mg  15 mg Intravenous Q6H Stegmayer, Kimberly A, PA-C   15 mg at 07/25/18 0528  . MEDLINE mouth rinse  15 mL Mouth Rinse BID Annice Needy, MD   15 mL at 07/25/18 9629  . metoprolol tartrate (LOPRESSOR) injection 5 mg  5 mg Intravenous Q6H Annice Needy, MD   5 mg at 07/24/18 1720  . midodrine (PROAMATINE) tablet 10 mg  10 mg Oral TID WC Annice Needy, MD   10 mg at 07/25/18 0757  . mometasone-formoterol (DULERA) 200-5 MCG/ACT inhaler 2 puff  2 puff Inhalation BID Annice Needy, MD   2 puff at 07/25/18 0935  . morphine 2 MG/ML injection 2 mg  2 mg Intravenous Q4H PRN Annice Needy, MD   2 mg at 07/25/18 0932  . multivitamin-lutein (OCUVITE-LUTEIN) capsule 1 capsule  1 capsule Oral Daily Dew, Marlow Baars, MD   1 capsule at 07/23/18 1257  . nicotine (NICODERM CQ - dosed in mg/24 hours) patch 14 mg  14 mg Transdermal Daily PRN Annice Needy, MD      . ondansetron [facility]) injection 4 mg  4 mg Intravenous Q6H PRN Annice Needy, MD   4 mg at 07/18/18 1034  . ondansetron (ZOFRAN) injection 4 mg  4 mg Intravenous Q6H PRN Annice Needy, MD      . pantoprazole (PROTONIX) EC tablet 40 mg  40 mg Oral BID Annice Needy, MD   40 mg at 07/25/18 0931  . permethrin (ELIMITE) 5 % cream   Topical Once Mody, Sital, MD      . polyethylene glycol (MIRALAX / GLYCOLAX) packet 17 g  17 g Oral Daily PRN Annice Needy, MD      . polyethylene glycol (MIRALAX / GLYCOLAX) packet 17 g  17 g Oral Daily Annice Needy, MD   17 g at 07/25/18 0932  . prochlorperazine (COMPAZINE) injection 5 mg  5 mg Intravenous Q6H PRN Annice Needy, MD      . protein supplement (ENSURE MAX) liquid  11 oz Oral BID Annice Needy, MD   11 oz at 07/25/18 5284  . rivaroxaban (XARELTO) tablet 10 mg  10 mg Oral Daily Levi Aland A, RPH      . sodium chloride flush (NS) 0.9 % injection 10-40 mL  10-40 mL Intracatheter PRN Mody, Sital, MD      . tamsulosin (FLOMAX) capsule 0.4 mg  0.4 mg Oral Daily Annice Needy, MD   0.4 mg at 07/25/18 0931  .  tiotropium (SPIRIVA) inhalation capsule ([facility] use ONLY) 18 mcg  18 mcg Inhalation Daily Annice Needyew, Jason S, MD   18 mcg at 07/25/18 [phone]  . vancomycin (VANCOCIN) IVPB 750 mg/150 ml premix  750 mg Intravenous Q24H Annice Needyew, Jason S, MD   Stopped at 07/24/18 2248  . vitamin C (ASCORBIC ACID) tablet 500 mg  500 mg Oral BID Annice Needyew, Jason S, MD   500 mg at 07/25/18 [id number]     Discharge Medications: Please see discharge summary for a list of discharge medications.  Relevant Imaging Results:  Relevant Lab Results:   Additional Information PICC line and IV Vanc until 2/7; treated for lice while in hospital    SS: [id number]  York SpanielMonica Nyshawn Gowdy, LCSW

## 2018-07-25 NOTE — Progress Notes (Signed)
Daily Progress Note   Patient Name: April [name]       Date: 07/25/2018 DOB: 22-Feb-1956  Age: 63 y.o. MRN#: [id number] Attending Physician: Adrian SaranMody, Sital, MD Primary Care Physician: Mickel FuchsWroth, Thomas H, MD Admit Date: 07/09/2018  Reason for Consultation/Follow-up: Establishing goals of care  Subjective: Patient awake, A&O x3. She denies shortness of breath or pain. Does state left stump area is tender, but pain is controlled. Continues on isolation for lice. She is upset they sent her grilled chicken for breakfast instead of bacon. Discussed heart healthy diet. She continued to verbalize her dissatisfaction with the chicken. She ate all of her cheerios and requested another serving and more coffee. She expresses she is ready to get out the hospital. Support given.   Daughter is at the bedside. She verbalizes she has no concerns or questions at this time. States she is ready for the healing to take place and glad everything went ok.   Both patient and daughter verbalizes plans for palliative support outpatient once discharged.   Length of Stay: 16  Current Medications: Scheduled Meds:  . aspirin EC  81 mg Oral Daily  . atorvastatin  40 mg Oral q1800  . ferrous sulfate  325 mg Oral BID WC  . furosemide  40 mg Oral Daily  . insulin aspart  0-15 Units Subcutaneous TID WC  . insulin aspart  0-5 Units Subcutaneous QHS  . insulin aspart  3 Units Subcutaneous TID WC  . insulin glargine  10 Units Subcutaneous QHS  . ketorolac  15 mg Intravenous Q6H  . mouth rinse  15 mL Mouth Rinse BID  . metoprolol tartrate  5 mg Intravenous Q6H  . midodrine  10 mg Oral TID WC  . mometasone-formoterol  2 puff Inhalation BID  . multivitamin-lutein  1 capsule Oral Daily  . pantoprazole  40 mg Oral BID  .  permethrin   Topical Once  . polyethylene glycol  17 g Oral Daily  . ENSURE MAX PROTEIN  11 oz Oral BID  . tamsulosin  0.4 mg Oral Daily  . tiotropium  18 mcg Inhalation Daily  . ascorbic acid  500 mg Oral BID    Continuous Infusions: . sodium chloride 250 mL (07/21/18 1822)  . heparin 1,100 Units/hr (07/25/18 0703)  . vancomycin Stopped (07/24/18 2248)  PRN Meds: sodium chloride, acetaminophen, albuterol, ALPRAZolam, guaiFENesin-dextromethorphan, HYDROcodone-acetaminophen, morphine injection, nicotine, ondansetron (ZOFRAN) IV, ondansetron (ZOFRAN) IV, polyethylene glycol, prochlorperazine, sodium chloride flush  Physical Exam Vitals signs and nursing note reviewed.  Constitutional:      General: She is awake.     Comments: Chronically ill   Cardiovascular:     Rate and Rhythm: Normal rate.  Pulmonary:     Effort: Pulmonary effort is normal.     Breath sounds: Decreased breath sounds present.  Abdominal:     General: Bowel sounds are normal.     Palpations: Abdomen is soft.  Musculoskeletal:     Comments: S/p left BKA, dressing dry, intact   Skin:    General: Skin is warm and dry.     Findings: Bruising present.  Neurological:     Mental Status: She is alert and oriented to person, place, and time.  Psychiatric:        Behavior: Behavior is cooperative.             Vital Signs: BP (!) 115/40 (BP Location: Right Arm)   Pulse 82   Temp (!) 97.4 F (36.3 C) (Oral)   Resp 18   Ht 5\' 2"  (1.575 m)   Wt 46.9 kg   SpO2 100%   BMI 18.91 kg/m  SpO2: SpO2: 100 % O2 Device: O2 Device: Nasal Cannula O2 Flow Rate: O2 Flow Rate (L/min): 2 L/min  Intake/output summary:   Intake/Output Summary (Last 24 hours) at 07/25/2018 1035 Last data filed at 07/25/2018 0759 Gross per 24 hour  Intake 650 ml  Output 350 ml  Net 300 ml   LBM: Last BM Date: 07/21/18 Baseline Weight: Weight: 47.8 kg Most recent weight: Weight: 46.9 kg       Palliative Assessment/Data: PPS  30%   Flowsheet Rows     Most Recent Value  Intake Tab  Referral Department  Critical care  Unit at Time of Referral  ICU  Date Notified  07/18/18  Palliative Care Type  New Palliative care  Reason for referral  Clarify Goals of Care  Date of Admission  07/09/18  Date first seen by Palliative Care  07/21/18  # of days Palliative referral response time  3 Day(s)  # of days IP prior to Palliative referral  9  Clinical Assessment  Psychosocial & Spiritual Assessment  Palliative Care Outcomes      Patient Active Problem List   Diagnosis Date Noted  . Diabetic foot infection (HCC) 07/09/2018  . Acute on chronic respiratory failure with hypoxemia (HCC) 06/03/2018  . Acute respiratory failure with hypoxia and hypercapnia (HCC)   . Acute osteomyelitis of right foot (HCC) 05/23/2018  . Protein-calorie malnutrition, severe 04/10/2018  . Diabetes (HCC) 04/08/2018  . COPD (chronic obstructive pulmonary disease) (HCC) 04/08/2018  . HLD (hyperlipidemia) 04/08/2018  . Diabetic foot ulcer (HCC) 04/08/2018  . 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 Elevated CEA 01/30/2014    Palliative Care Assessment & Plan   Patient Profile: 63 y.o. female admitted on 07/09/2018 from her Podiatrist office with complaints bilateral infected foot wounds. She has a past medical history of COPD, tobacco use, type 2 diabetes, recurrent left lower DVT (Xarelto), hyperlipidemia, peripheral artery disease, and anxiety. On admission patient complained of bilateral lower extremity pain. Denied fever, chills. Endorsed right foot wound drainage. There are open wounds to the plantar surface of her right foot and left foot heel. Vascular surgery consulted. Patient  underwent bilateral femoral endarterectomies on 07/17/18.  Patient developed shortness of breath, lethargy, and tachycardia. She was evaluated by cardiology. Given elevated troponin and symptoms patient was found to  have non-STEMI. Cardiology recommendation for medical management. Palliative Medicine team consulted for goals of care discussion.   Recommendations/Plan:  DNR/DNI  Continue to treat  Outpatient palliative at discharge  PMT will continue to support and follow.   Goals of Care and Additional Recommendations:  Limitations on Scope of Treatment: Full Scope Treatment  Code Status:    Code Status Orders  (From admission, onward)         Start     Ordered   07/21/18 1303  Do not attempt resuscitation (DNR)  Continuous    Question Answer Comment  In the event of cardiac or respiratory ARREST Do not call a "code blue"   In the event of cardiac or respiratory ARREST Do not perform Intubation, CPR, defibrillation or ACLS   In the event of cardiac or respiratory ARREST Use medication by any route, position, wound care, and other measures to relive pain and suffering. May use oxygen, suction and manual treatment of airway obstruction as needed for comfort.      07/21/18 1302        Code Status History    Date Active Date Inactive Code Status Order ID Comments User Context   07/09/2018 1730 07/21/2018 1302 Partial Code [id number]  Campbell StallMayo, Katy Dodd, MD Inpatient   06/03/2018 0621 06/13/2018 2013 Full Code [id number]  Arnaldo Nataliamond, Michael S, MD Inpatient   05/23/2018 0253 06/01/2018 2158 Full Code [id number]  Cammy CopaMaier, Angela, MD Inpatient   05/23/2018 0138 05/23/2018 0253 DNR [id number]  Cammy CopaMaier, Angela, MD ED   04/10/2018 1111 04/11/2018 1554 DNR [id number]  Milagros LollSudini, Srikar, MD Inpatient   04/09/2018 1519 04/10/2018 1111 Full Code [id number]  Milagros LollSudini, Srikar, MD Inpatient   04/09/2018 1337 04/09/2018 1519 DNR [id number]  Milagros LollSudini, Srikar, MD Inpatient   04/09/2018 0012 04/09/2018 1337 Full Code [id number]  Oralia ManisWillis, David, MD ED     Prognosis:   Guarded   Discharge Planning:  To Be Determined  Thank you for allowing the Palliative Medicine Team to assist in the care of this patient.   Total Time 25 min.   Prolonged Time Billed NO       Greater than 50%  of this time was spent counseling and coordinating care related to the above assessment and plan.  Willette AlmaNikki Pickenpack-Cousar, AGPCNP-BC Palliative Medicine Team  Pager: [phone] Amion: Thea AlkenN. Cousar   Please contact Palliative Medicine Team phone at [phone] for questions and concerns.

## 2018-07-25 NOTE — Progress Notes (Signed)
[employer] [facility] [place] [facility]   PATIENT NAME: Coletta Nawaz    MR#:  [id number]  DATE OF BIRTH:  01-Oct-1955  SUBJECTIVE:   Patient c/o pain this am Found to have head LICE  REVIEW OF SYSTEMS:    Review of Systems  Constitutional: Negative for fever, chills weight loss HENT: Negative for ear pain, nosebleeds, congestion, facial swelling, rhinorrhea, neck pain, neck stiffness and ear discharge.    Hard of hearing   respiratory: Negative for cough, shortness of breath, wheezing  Cardiovascular: Negative for chest pain, palpitations and leg swelling.  Gastrointestinal: Negative for heartburn, abdominal pain, vomiting, diarrhea or consitpation Genitourinary: Negative for dysuria, urgency, frequency, hematuria Musculoskeletal: Negative for back pain or joint pain  + stump pain   Neurological: Negative for dizziness, seizures, syncope, focal weakness,  numbness and headaches.  Hematological: Does not bruise/bleed easily.  Psychiatric/Behavioral: Negative for hallucinations, confusion, dysphoric mood    Tolerating Diet: yes     DRUG ALLERGIES:   Allergies  Allergen Reactions  . Eggs Or Egg-Derived Products Diarrhea, Itching and Nausea And Vomiting    VITALS:  Blood pressure (!) 115/40, pulse 82, temperature (!) 97.4 F (36.3 C), temperature source Oral, resp. rate 18, height 5\' 2"  (1.575 m), weight 46.9 kg, SpO2 100 %.  PHYSICAL EXAMINATION:  Constitutional: Appears well-developed and well-nourished. No distress. HENT: Normocephalic. Marland Kitchen Oropharynx is clear and moist.  Eyes: Conjunctivae and EOM are normal. PERRLA, no scleral icterus.  Neck: Normal ROM. Neck supple. No JVD. No tracheal deviation. CVS: RRR, S1/S2 +, no murmurs, no gallops, no carotid bruit.  Pulmonary: Effort and breath sounds normal, no stridor, rhonchi, wheezes, rales.  Abdominal: Soft. BS +,  no distension, tenderness, rebound or guarding.  Musculoskeletal: Normal range of  motion. No edema and no tenderness.  Neuro: Alert. CN 2-12 grossly intact. No focal deficits. Skin: Wound VAC to right foot and left BKA bandaged Psychiatric: Normal mood and affect.      LABORATORY PANEL:   CBC Recent Labs  Lab 07/25/18 0531  WBC 11.3*  HGB 9.3*  HCT 29.7*  PLT 262   ------------------------------------------------------------------------------------------------------------------  Chemistries  Recent Labs  Lab 07/25/18 0531  NA 137  K 4.7  CL 100  CO2 34*  GLUCOSE 277*  BUN 22  CREATININE 0.70  CALCIUM 8.0*  MG 2.3   ------------------------------------------------------------------------------------------------------------------  Cardiac Enzymes No results for input(s): TROPONINI in the last 168 hours. ------------------------------------------------------------------------------------------------------------------  RADIOLOGY:  No results found.   ASSESSMENT AND PLAN:   63 year old female with a history of DVT on Xarelto, diabetes and COPD who was directly admitted from podiatry office due to right foot infection and left heel ulceration.  1.  Bilateral infected diabetic foot wounds with right foot osteomyelitis/peripheral vascular disease: POD #1 left BKA  MRSA from wound culture and ID recommends continuing vancomycin through February 7 and now discontinue Rocephin  Needs PICC and home antibiotic sheet   2.  Severe PAD bilateral status post endovascular and open intervention with vascular surgery now with osteomyelitis of the left heel and right foot wound VAC: POD #1 left BKA Continue statin and aspirin   3.  Hypotension: Patient is off of pressors. Continue Midodrine.   4.  Non-STEMI: Patient evaluated by cardiology.  Recommendations for supportive management. Continue aspirin, statin, metoprolol Troponin max was1.19 and has trended down.   5.Tobacco dependence: Patient is encouraged to quit smoking. Counseling was provided  for 4 minutes.  6.  Urinary retention: Continue tamsulosin  7.  Diabetes: Continue NovoLog, Lantus and sliding scale  8.  History of ZOX:WRUEAVVT:Change to PO Xarelto    management plans discussed with the patient and family and they are in agreement.  CODE STATUS:  DNR  TOTAL TIME TAKING CARE OF THIS PATIENT: 25 minutes.     POSSIBLE D/C tomorrow Physical therapy has recommended home with home health.    Jim Philemon M.D on 07/25/2018 at 11:02 AM  Between 7am to 6pm - Pager - [phone] After 6pm go to [email or web address] - password [email or web address]  [facility]  Office  [phone]  CC: Primary care physician; Mickel FuchsWroth, Thomas H, MD  Note: This dictation was prepared with Dragon dictation along with smaller phrase technology. Any transcriptional errors that result from this process are unintentional.

## 2018-07-25 NOTE — Treatment Plan (Signed)
Diagnosis: MRSA rt foot infection/osteomyelitis Baseline Creatinine 0.70 on 07/25/18 Vanco trough 15 on 07/21/18  Culture Result: MRSA  Allergies  Allergen Reactions  . Eggs Or Egg-Derived Products Diarrhea, Itching and Nausea And Vomiting    OPAT Orders Discharge antibiotics: Vancomycin 750mg  IV every 24 hours Per pharmacy protocol  Aim for Vancomycin trough 15-20  End Date: 08/08/18  [facility] Care Per Protocol including placement of biopatch  Labs every Monday  while on IV antibiotics: _X_ CBC with differential  _X_ CMP _X__ Vancomycin trough  Every Thursday Vancomycin trough BMP  _X_ Please pull PIC at completion of IV antibiotics  _ Fax weekly labs to [phone]  Call [phone] if any questions  Follow up with podiatrist

## 2018-07-25 NOTE — Care Management Note (Signed)
Case Management Note  Patient Details  Name: April [name] MRN: [id number] Date of Birth: [birth year]   Patient with knew left BKA this admission.  Patient with wound vac to the rt foot.  Patient will require IV antibiotics at discharge.  RNCM spoke with patient significant other Mr April [name] about discharge plan.  Per Mr. April [name] him and the patient have lived together for 22 years.  Mr. April [name] would prefer patient to be worked up for SNF at discharge if approved.  Mr April [name] states if the only option is home "we will do what we have to do". Mr. April [name] states that if patient returns home patient's granddaughter will be staying in the home to help.   Mr. April [name] states that the patient had previously had IV antibiotics in the home.  Mr. April [name] confirms that they do have an electric WC in the home, however the battery does not work.  If patient returns home he would like to pursue obtaining a manuel WC. He expresses financial concerns about the cost of the WC.  If patient discharges home copays for vac, IV antibiotics, and WC may be a barrier.   Subjective/Objective:                    Action/Plan:   Expected Discharge Date:                  Expected Discharge Plan:  Home w Home Health Services  In-House Referral:     Discharge planning Services  CM Consult  Post Acute Care Choice:  Durable Medical Equipment, Resumption of Svcs/PTA Provider Choice offered to:     DME Arranged:  Negative pressure wound device DME Agency:  [facility]  HH Arranged:    HH Agency:  [facility]  Status of Service:     If discussed at Long Length of Stay Meetings, dates discussed:    Additional Comments:  Chapman Fitch, RN 07/25/2018, 2:23 PM

## 2018-07-25 NOTE — Progress Notes (Signed)
Inpatient Diabetes Program Recommendations  AACE/ADA: New Consensus Statement on Inpatient Glycemic Control (2015)  Target Ranges:  Prepandial:   less than 140 mg/dL      Peak postprandial:   less than 180 mg/dL (1-2 hours)      Critically ill patients:  140 - 180 mg/dL   Lab Results  Component Value Date   GLUCAP 293 (H) 07/25/2018   HGBA1C 10.9 (H) 05/25/2018    Review of Glycemic ControlResults for April [name], April [name] (MRN [id number]) as of 07/25/2018 10:13  Ref. Range 07/24/2018 12:42 07/24/2018 16:46 07/24/2018 21:38 07/25/2018 07:30  Glucose-Capillary Latest Ref Range: 70 - 99 mg/dL 209 (H) 470 (H) 962 (H) 293 (H)    Diabetes history: DM 2 Outpatient Diabetes medications: Lantus 10 units daily Current orders for Inpatient glycemic control:  Lantus 10 units q HS, Novolog moderate tid with meals and HS, Novolog 3 units tid with meals  Inpatient Diabetes Program Recommendations:   Consider increasing Lantus to 15 units q HS.  Thanks, Beryl Meager, RN, BC-ADM Inpatient Diabetes Coordinator Pager [phone] (8a-5p)

## 2018-07-25 NOTE — Progress Notes (Signed)
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 Eggs Or Egg-Derived Products Diarrhea, Itching and Nausea And Vomiting    Patient Measurements: Height: 5\' 2"  (157.5 cm) Weight: 103 lb 6.3 oz (46.9 kg) IBW/kg (Calculated) : 50.1 Heparin Dosing Weight: 36.6 kg - reported by Jacqulyn Cane RN  Vital Signs: Temp: 97.4 F (36.3 C) (01/24 0458) Temp Source: Oral (01/24 0458) BP: 115/40 (01/24 0458) Pulse Rate: 82 (01/24 0458)  Labs: Recent Labs    07/23/18 0441 07/24/18 0448 07/25/18 0531  HGB 9.7* 10.2* 9.3*  HCT 31.1* 33.4* 29.7*  PLT 241 262 262  LABPROT  --  15.3*  --   INR  --  1.22  --   HEPARINUNFRC 0.38 0.45  --   CREATININE 0.64 0.61 0.70    Estimated Creatinine Clearance: 53.3 mL/min (by C-G formula based on SCr of 0.7 mg/dL).   Assessment: Pharmacy was consulted for heparin.  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 Will follow aPTT.   Goal of Therapy:  APTT level 66-102 seconds or Heparin level 0.3-0.7 units/ml once heparin level correlates with aPTT  Monitor platelets by anticoagulation protocol: Yes  Heparin Course: 1/13 AM heparin level 0.24. 600 unit  bolus and increase rate t o 850 units/hr. Recheck in 6 hours.  1/13:  HL 2055 = 0.17 .   Heparin was not held during any point on 1/13, angiogram was not done.   Will order heparin 1750 units IV X 1 once and increase drip rate to 1050 units/hr.  Will recheck HL 6 hrs after rate change.  1/14 angiogram with heparin on hold: restarted at at 1050 units/hr 1/15 1737 HL 0.76 1/16 0526 HL 0.21: rate increased to 1150 units/hr 01/17 0200 HL 0.41  01/17 0500 drip held for central line placement will retime HL for 1200. 1/17 1751 HL 0.23: rate increased to 1250 units/hr 1/18 0100 HL 0.44 1/19  0500 HL 0.84: rate to 1100  units/hr 1/20 0200 HL 0.50 1/21 AM HL 0.40 1/22 AM HL 0.38 1/23 AM HL 0.45: held for BKA at 0645  Plan:  Heparin discontinued by Dr. Juliene Pina and transitioning back to Xarelto.  Discussed with Dr. Juliene Pina regarding need for full dose anticoagulation or back to reduced dose prophylaxis.  Dr. Juliene Pina stated reduced dosed was most appropriate.  Will order Xarelto 10 mg once daily starting when Heparin infusion is stopped or at least within 2 hours of stopped infusion.  Orinda Kenner, PharmD Clinical Pharmacist 07/25/2018,11:21 AM

## 2018-07-25 NOTE — Progress Notes (Signed)
[facility] Daily Progress Note   Subjective: 1 Day Post-Op: Left below-the-knee amputation  No issues overnight. Complaining of left stump pain.   Objective: Vitals:   07/24/18 2320 07/25/18 0458 07/25/18 0500 07/25/18 1149  BP:  (!) 115/40  (!) 95/51  Pulse:  82  86  Resp:  18    Temp: 98.3 F (36.8 C) (!) 97.4 F (36.3 C)    TempSrc: Oral Oral    SpO2:  100%  99%  Weight:   46.9 kg   Height:        Intake/Output Summary (Last 24 hours) at 07/25/2018 1225 Last data filed at 07/25/2018 0759 Gross per 24 hour  Intake 650 ml  Output 350 ml  Net 300 ml   Physical Exam: A&Ox3, NAD CV: RRR Pulmonary: CTA Bilaterally Abdomen: Soft, Nontender, Nondistended Vascular:  Right Lower Extremity: VAC intact.  Left Lower Extremity: OR dressing in place. Thigh soft. Flexible at the knee. Dressing clean, dry and intact.   Laboratory: CBC    Component Value Date/Time   WBC 11.3 (H) 07/25/2018 0531   HGB 9.3 (L) 07/25/2018 0531   HGB 13.6 10/14/2014 0644   HCT 29.7 (L) 07/25/2018 0531   HCT 41.3 10/14/2014 0644   PLT 262 07/25/2018 0531   PLT 156 10/14/2014 0644   BMET    Component Value Date/Time   NA 137 07/25/2018 0531   NA 142 10/14/2014 0644   K 4.7 07/25/2018 0531   K 3.6 10/14/2014 0644   CL 100 07/25/2018 0531   CL 109 10/14/2014 0644   CO2 34 (H) 07/25/2018 0531   CO2 29 10/14/2014 0644   GLUCOSE 277 (H) 07/25/2018 0531   GLUCOSE 142 (H) 10/14/2014 0644   BUN 22 07/25/2018 0531   BUN 9 10/14/2014 0644   CREATININE 0.70 07/25/2018 0531   CREATININE 0.53 10/14/2014 0644   CALCIUM 8.0 (L) 07/25/2018 0531   CALCIUM 7.7 (L) 10/14/2014 0644   GFRNONAA >60 07/25/2018 0531   GFRNONAA >60 10/14/2014 0644   GFRAA >60 07/25/2018 0531   GFRAA >60 10/14/2014 0160   Assessment/Planning: 63 year old female with multiple medical issues including severe PAD now POD #1 left BKA 1) gram drop in hbg - intraop blood loss. Dressing is clean and dry.  Thigh is soft.  2) Vicodin made q4 and Toradol increase to 30mg  q6 for better pain control. Follow BMP.  3) Neurontin 100mg  @ hs started for nerve pain. Can increase if tolerates.  4) Encouraged elevation of left lower extremity. Encouraged not to place direct pressure on stump.  5) I have placed follow up and stump care instructions in the patients discharge  Discussed with Dr. Wallis Mart [facility] PA-C 07/25/2018 12:25 PM

## 2018-07-25 NOTE — Progress Notes (Signed)
MD ordered Permethrin cream for treatment of lice.

## 2018-07-25 NOTE — Evaluation (Signed)
Physical Therapy Re-Evaluation Patient Details Name: April [name] MRN: [id number] DOB: 10-03-1955 Today's Date: 07/25/2018   History of Present Illness  Pt is a 63 y.o. female presented to the hospital 07/09/18 (direct podiatry office admit).  Pt with R foot infection and L heel ulceration (possible osteomyelitis R foot).  Assessment includes: Bilateral infected diabetic foot wounds with right foot osteomyelitis/peripheral vascular disease, severe bilateral PAD, hypotension, NSTEMI, and DM.  Pt is s/p excisional debridement R foot with wound VAC placement 07/11/18,  s/p angio 07/15/18, and s/p LLE BKA 07/24/18.    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    Clinical Impression  Pt presents with deficits in strength, transfers, mobility, balance, and activity tolerance.  Pt required the use of bed rails and extra time and effort during sup to/from sit and then min A scoot to the [facility].  Pt tolerated the below therex well with only minimal increase in her LLE surgical site pain.  Per pt, she in non-ambulatory secondary to a h/o bilateral foot wounds with family providing near total assist with transfers at baseline.  Pt would benefit from HHPT to address the above deficits and for pt/family education for strategies to decrease caregiver assist and increase pt participation with functional mobility.       Follow Up Recommendations Home health PT;Supervision for mobility/OOB    Equipment Recommendations  None recommended by PT    Recommendations for Other Services       Precautions / Restrictions Precautions Precautions: Fall Precaution Comments: Wound vac R foot Restrictions Weight Bearing Restrictions: Yes RLE Weight Bearing: Non weight bearing LLE Weight Bearing: Non weight bearing Other Position/Activity Restrictions: Per Dr. Wyn Quaker pt has no bed rest restrictions or positional restrictions      Mobility  Bed Mobility Overal bed mobility: Modified Independent             General bed mobility comments: Extra time and effort required during sup to/from sit but no physical assistance.   Transfers                 General transfer comment: NT  Ambulation/Gait             General Gait Details: Unable/unsafe to attempt with pt NWB on the RLE  Stairs            Wheelchair Mobility    Modified Rankin (Stroke Patients Only)       Balance Overall balance assessment: Needs assistance Sitting-balance support: Bilateral upper extremity supported Sitting balance-Leahy Scale: Fair                                       Pertinent Vitals/Pain Pain Assessment: 0-10 Pain Score: 5  Pain Location: LLE surgical incision Pain Descriptors / Indicators: Aching;Sore Pain Intervention(s): Monitored during session    Home Living Family/patient expects to be discharged to:: Private residence Living Arrangements: Non-relatives/Friends Available Help at Discharge: Friend(s);Family;Available 24 hours/day;Home health Type of Home: Apartment Home Access: Stairs to enter Entrance Stairs-Rails: Right;Left;Can reach both Entrance Stairs-Number of Steps: 4 Home Layout: One level Home Equipment: Walker - 2 wheels;Cane - single point;Bedside commode;Wheelchair - manual      Prior Function Level of Independence: Needs assistance   Gait / Transfers Assistance Needed: Pt reports that her friend (that she lives with) and granddaughter have been carrying her to transfer to different surfaces (d/t B  foot wounds)  ADL's / Homemaking Assistance Needed: Assist for bathing, dressing, meals, ADL's in general.        Hand Dominance        Extremity/Trunk Assessment   Upper Extremity Assessment Upper Extremity Assessment: Generalized weakness    Lower Extremity Assessment Lower Extremity Assessment: Generalized weakness RLE Deficits / Details: 3/5 hip flex, 3+/5 knee ext/flex, DF/PF strength NT but pt able to perform small  amplitude ankle pumps RLE: Unable to fully assess due to pain LLE Deficits / Details: 3+/5 hip flex, knee flex/ext strength NT but pt demonstrated good AROM during LAQs LLE: Unable to fully assess due to pain       Communication   Communication: HOH  Cognition Arousal/Alertness: Awake/alert Behavior During Therapy: WFL for tasks assessed/performed Overall Cognitive Status: Within Functional Limits for tasks assessed                                        General Comments      Exercises Total Joint Exercises Ankle Circles/Pumps: AROM;Right;5 reps;10 reps Quad Sets: Strengthening;Both;5 reps;10 reps Gluteal Sets: Strengthening;Both;5 reps;10 reps Short Arc Quad: AROM;Both;10 reps Hip ABduction/ADduction: Strengthening;Both;10 reps;Supine Straight Leg Raises: Strengthening;Both;Supine;10 reps Long Arc Quad: AROM;Both;5 reps;10 reps Knee Flexion: AROM;Both;5 reps;10 reps   Assessment/Plan    PT Assessment Patient needs continued PT services  PT Problem List Decreased strength;Decreased activity tolerance;Decreased balance;Decreased mobility;Decreased knowledge of precautions       PT Treatment Interventions DME instruction;Functional mobility training;Therapeutic activities;Therapeutic exercise;Balance training;Patient/family education    PT Goals (Current goals can be found in the Care Plan section)  Acute Rehab PT Goals Patient Stated Goal: Decreased pain PT Goal Formulation: With patient Time For Goal Achievement: 08/07/18 Potential to Achieve Goals: Good    Frequency Min 2X/week   Barriers to discharge        Co-evaluation               AM-PAC PT "6 Clicks" Mobility  Outcome Measure Help needed turning from your back to your side while in a flat bed without using bedrails?: A Little Help needed moving from lying on your back to sitting on the side of a flat bed without using bedrails?: A Lot Help needed moving to and from a bed to a chair  (including a wheelchair)?: Total Help needed standing up from a chair using your arms (e.g., wheelchair or bedside chair)?: Total Help needed to walk in hospital room?: Total Help needed climbing 3-5 steps with a railing? : Total 6 Click Score: 9    End of Session Equipment Utilized During Treatment: Oxygen Activity Tolerance: Patient tolerated treatment well Patient left: in bed;with call bell/phone within reach;with bed alarm set Nurse Communication: Mobility status PT Visit Diagnosis: Muscle weakness (generalized) (M62.81) Pain - Right/Left: Left Pain - part of body: Leg    Time: 9935-7017 PT Time Calculation (min) (ACUTE ONLY): 23 min   Charges:   PT Evaluation $PT Re-evaluation: 1 Re-eval PT Treatments $Therapeutic Exercise: 8-22 mins        D. Scott Laylia Mui PT, DPT 07/25/18, 10:45 AM

## 2018-07-25 NOTE — Clinical Social Work Note (Signed)
Authorization received from [facility] for patient to go to [facility] on 1/25. DSS APS caseworker: Arthur Holms notified. York Spaniel MSW,LCSW [phone]

## 2018-07-25 NOTE — Care Management Important Message (Signed)
Important Message  Patient Details  Name: April [name] MRN: [id number] Date of Birth: 11/10/1955   Medicare Important Message Given:  Yes    Chapman Fitch, RN 07/25/2018, 2:44 PM

## 2018-07-25 NOTE — Progress Notes (Signed)
R IJ Removed per MD order. R single Lumen PICC line in place. MD aware of elevated B.S. No new orders given. Pt received appropriate dose of insulin according to sliding scale and meal coverage.

## 2018-07-25 NOTE — Clinical Social Work Note (Signed)
Clinical Social Work Assessment  Patient Details  Name: April [name] MRN: [id number] Date of Birth: [birth year]  Date of referral:  07/25/18               Reason for consult:  Discharge Planning                Permission sought to share information with:    Permission granted to share information::     Name::        Agency::     Relationship::     Contact Information:     Housing/Transportation Living arrangements for the past 2 months:  Single Family Home Source of Information:    Patient Interpreter Needed:  None Criminal Activity/Legal Involvement Pertinent to Current Situation/Hospitalization:  No - Comment as needed Significant Relationships:  Significant Other, Adult Children Lives with:  Adult Children Do you feel safe going back to the place where you live?  Yes Need for family participation in patient care:  Yes (Comment)  Care giving concerns:  Patient resides with her significant other of 20 years and her daughter.    Social Worker assessment / plan:  Even though PT is recommending home health, patient will have a IV antibiotics and a wound vac at discharge. CSW spoke with patient's significant other and he states patient and himself are in agreement with placement for the medication and wound vac.  They are in agreement with [facility] as this facility was the only facility able to take patient. Tresa Endo at [facility] is aware of patient being treated for lice. CSW has initiated prior auth with [facility].   Employment status:    Insurance informationAdvertising account executive PT Recommendations:  Home with Home Health Information / Referral to community resources:     Patient/Family's Response to care:  Patient's significant other is assisting patient with placement.   Patient/Family's Understanding of and Emotional Response to Diagnosis, Current Treatment, and Prognosis:  Patient and significant other are wanting to make things as simple for the healing  process as able so they are both in agreement with placement.   Emotional Assessment Appearance:  Appears stated age, Appears older than stated age Attitude/Demeanor/Rapport:    Affect (typically observed):    Orientation:  Oriented to Self, Oriented to Place, Oriented to  Time, Oriented to Situation Alcohol / Substance use:  Not Applicable Psych involvement (Current and /or in the community):  No (Comment)  Discharge Needs  Concerns to be addressed:  Care Coordination Readmission within the last 30 days:  No Current discharge risk:  None Barriers to Discharge:  No Barriers Identified   York Spaniel, LCSW 07/25/2018, 1:39 PM

## 2018-07-25 NOTE — Progress Notes (Signed)
One lice noted on patient's hair near face.  Contact Isolation maintained; family at bedside. Windy Carina, RN 5:13 AM.07/25/2018

## 2018-07-26 LAB — CBC
HCT: 24.7 % — ABNORMAL LOW (ref 36.0–46.0)
Hemoglobin: 7.5 g/dL — ABNORMAL LOW (ref 12.0–15.0)
MCH: 28.8 pg (ref 26.0–34.0)
MCHC: 30.4 g/dL (ref 30.0–36.0)
MCV: 95 fL (ref 80.0–100.0)
Platelets: 211 10*3/uL (ref 150–400)
RBC: 2.6 MIL/uL — ABNORMAL LOW (ref 3.87–5.11)
RDW: 15.9 % — ABNORMAL HIGH (ref 11.5–15.5)
WBC: 8.4 10*3/uL (ref 4.0–10.5)
nRBC: 0 % (ref 0.0–0.2)

## 2018-07-26 LAB — BASIC METABOLIC PANEL
Anion gap: 2 — ABNORMAL LOW (ref 5–15)
BUN: 32 mg/dL — AB (ref 8–23)
CO2: 31 mmol/L (ref 22–32)
Calcium: 7.3 mg/dL — ABNORMAL LOW (ref 8.9–10.3)
Chloride: 100 mmol/L (ref 98–111)
Creatinine, Ser: 0.82 mg/dL (ref 0.44–1.00)
GFR calc Af Amer: >60 mL/min (ref >60)
GFR calc non Af Amer: >60 mL/min (ref >60)
GLUCOSE: 414 mg/dL — AB (ref 70–99)
Potassium: 4.3 mmol/L (ref 3.5–5.1)
Sodium: 133 mmol/L — ABNORMAL LOW (ref 135–145)

## 2018-07-26 LAB — GLUCOSE, CAPILLARY
Glucose-Capillary: 372 mg/dL — ABNORMAL HIGH (ref 70–99)
Glucose-Capillary: 385 mg/dL — ABNORMAL HIGH (ref 70–99)
Glucose-Capillary: 228 mg/dL — ABNORMAL HIGH (ref 70–99)

## 2018-07-26 LAB — ABO/RH: ABO/RH(D): B POS

## 2018-07-26 MED ORDER — INSULIN GLARGINE 100 UNIT/ML ~~LOC~~ SOLN: 15.0000 [IU] | Freq: Every day | SUBCUTANEOUS | 11 refills | Status: DC

## 2018-07-26 MED ORDER — VANCOMYCIN IV (FOR PTA / DISCHARGE USE ONLY): 750.0000 mg | INTRAVENOUS | 0 refills | Status: AC

## 2018-07-26 MED ORDER — NICOTINE 14 MG/24HR TD PT24: 14.0000 mg | MEDICATED_PATCH | Freq: Every day | TRANSDERMAL | 0 refills | Status: DC | PRN

## 2018-07-26 MED ORDER — HYDROCODONE-ACETAMINOPHEN 5-325 MG PO TABS: 1.0000 | ORAL_TABLET | Freq: Four times a day (QID) | ORAL | 0 refills | Status: DC | PRN

## 2018-07-26 MED ORDER — ALPRAZOLAM 0.5 MG PO TABS: 0.5000 mg | ORAL_TABLET | Freq: Two times a day (BID) | ORAL | 0 refills | Status: DC | PRN

## 2018-07-26 MED ORDER — INSULIN ASPART 100 UNIT/ML ~~LOC~~ SOLN: 3.0000 [IU] | Freq: Three times a day (TID) | SUBCUTANEOUS | 11 refills | Status: DC

## 2018-07-26 MED ORDER — GABAPENTIN 100 MG PO CAPS: 100.0000 mg | ORAL_CAPSULE | Freq: Every day | ORAL | Status: AC

## 2018-07-26 MED ORDER — MIDODRINE HCL 10 MG PO TABS: 10.0000 mg | ORAL_TABLET | Freq: Three times a day (TID) | ORAL | Status: DC

## 2018-07-26 NOTE — Discharge Summary (Signed)
[facility] [place] at [facility]   PATIENT NAME: April [name]    MR#:  [id number]  DATE OF BIRTH:  Apr 25, 1956  DATE OF ADMISSION:  07/09/2018 ADMITTING PHYSICIAN: Campbell StallKaty Dodd Mayo, MD  DATE OF DISCHARGE: 07/26/2018  PRIMARY CARE PHYSICIAN: Mickel FuchsWroth, Thomas H, MD    ADMISSION DIAGNOSIS:  Necrosis skin ulcer rt foot PVD  DISCHARGE DIAGNOSIS:  Active Problems:   Diabetic foot infection (HCC)   SECONDARY DIAGNOSIS:   Past Medical History:  Diagnosis Date  . Anxiety   . Colon polyps   . COPD (chronic obstructive pulmonary disease) (HCC)   . Diabetes mellitus without complication (HCC)   . DVT (deep venous thrombosis) (HCC)   . DVT (deep venous thrombosis) (HCC)   . History of colonic polyps   . Hyperlipidemia   . Peripheral artery disease (HCC)   . Pulmonary nodules/lesions, multiple 08/2012   NEGATIVE PET SCAN  . Retroperitoneal abscess (HCC)    history of in 04/2013  . Tobacco abuse     HOSPITAL COURSE:    63 year old female with a history of DVT on Xarelto, diabetes and COPD who was directly admitted from podiatry office due to right foot infection and left heel ulceration.  1.  Bilateral infected diabetic foot wounds with right foot osteomyelitis/peripheral vascular disease: POD #2 left BKA  MRSA from wound culture and ID recommends continuing vancomycin through August 08, 2018.  PICC line may be discontinued after this. Please see antibiotic sheet filled out by ID consultant.  2.  Severe PAD bilateral status post endovascular and open intervention with vascular surgery now with osteomyelitis of the left heel and right foot wound VAC: POD #2 left BKA Continue statin and aspirin Current elevation of left lower extremity.  Encouraged not to place direct pressure on stump. Continue PRN pain medication.  Patient will follow-up with surgery within the next 1 to 2 weeks.  3.  Hypotension: She will continue midodrine 4.  Non-STEMI: Patient evaluated  by cardiology.  Recommendations for supportive management. Continue aspirin, statin, metoprolol Troponin max was 1.19 and has trended down.   5.Tobacco dependence: Patient is encouraged to quit smoking. Counseling was provided for 4 minutes.  6.  Urinary retention: Continue tamsulosin  7.  Diabetes: Continue NovoLog, Lantus and ADA diet  8.  History of DVT: Continue Xarelto  9.  Acute on chronic anemia due to intraoperative blood loss: She received 1 unit of PRBC DISCHARGE CONDITIONS AND DIET:   stAble for discharge on heart healthy diabetic diet  CONSULTS OBTAINED:  Treatment Team:  Annice Needyew, Jason S, MD Gwyneth RevelsFowler, Justin, DPM Lynn Itoavishankar, Jayashree, MD Christeen DouglasBeasley, Bethany, MD  DRUG ALLERGIES:   Allergies  Allergen Reactions  . Eggs Or Egg-Derived Products Diarrhea, Itching and Nausea And Vomiting    DISCHARGE MEDICATIONS:   Allergies as of 07/26/2018      Reactions   Eggs Or Egg-derived Products Diarrhea, Itching, Nausea And Vomiting      Medication List    STOP taking these medications   insulin glargine 100 unit/mL Sopn Commonly known as:  LANTUS Replaced by:  insulin glargine 100 UNIT/ML injection   metFORMIN 500 MG tablet Commonly known as:  GLUCOPHAGE   potassium chloride 10 MEQ tablet Commonly known as:  K-DUR,KLOR-CON   SANTYL ointment Generic drug:  collagenase   sodium hypochlorite 0.125 % Soln Commonly known as:  DAKIN'S 1/4 STRENGTH     TAKE these medications   albuterol (2.5 MG/3ML) 0.083% nebulizer solution Commonly known as:  PROVENTIL Inhale 2.5 mg into the lungs every 6 (six) hours as needed for wheezing or shortness of breath.   albuterol 108 (90 Base) MCG/ACT inhaler Commonly known as:  PROVENTIL HFA;VENTOLIN HFA Inhale 2 puffs into the lungs every 6 (six) hours as needed for wheezing or shortness of breath.   ALPRAZolam 0.5 MG tablet Commonly known as:  XANAX Take 1 tablet (0.5 mg total) by mouth 2 (two) times daily as needed for  anxiety.   ascorbic acid 500 MG tablet Commonly known as:  VITAMIN C Take 1 tablet (500 mg total) by mouth 2 (two) times daily.   aspirin 81 MG chewable tablet Chew 81 mg by mouth daily.   atorvastatin 40 MG tablet Commonly known as:  LIPITOR Take 40 mg by mouth daily.   ferrous sulfate 325 (65 FE) MG tablet Take 1 tablet (325 mg total) by mouth 2 (two) times daily with a meal.   Fluticasone-Salmeterol 250-50 MCG/DOSE Aepb Commonly known as:  ADVAIR DISKUS Inhale 1 puff into the lungs 2 (two) times daily.   furosemide 40 MG tablet Commonly known as:  LASIX Take 1 tablet (40 mg total) by mouth daily.   gabapentin 100 MG capsule Commonly known as:  NEURONTIN Take 1 capsule (100 mg total) by mouth at bedtime.   guaiFENesin-dextromethorphan 100-10 MG/5ML syrup Commonly known as:  ROBITUSSIN DM Take 5 mLs by mouth every 4 (four) hours as needed for cough.   HYDROcodone-acetaminophen 5-325 MG tablet Commonly known as:  NORCO/VICODIN Take 1-2 tablets by mouth every 6 (six) hours as needed for moderate pain or severe pain.   insulin aspart 100 UNIT/ML injection Commonly known as:  novoLOG Inject 3 Units into the skin 3 (three) times daily with meals.   insulin glargine 100 UNIT/ML injection Commonly known as:  LANTUS Inject 0.15 mLs (15 Units total) into the skin at bedtime. Replaces:  insulin glargine 100 unit/mL Sopn   midodrine 10 MG tablet Commonly known as:  PROAMATINE Take 1 tablet (10 mg total) by mouth 3 (three) times daily with meals.   nicotine 14 mg/24hr patch Commonly known as:  NICODERM CQ - dosed in mg/24 hours Place 1 patch (14 mg total) onto the skin daily as needed (nicotine withdrawal).   pantoprazole 40 MG tablet Commonly known as:  PROTONIX Take 40 mg by mouth 2 (two) times daily.   polyethylene glycol packet Commonly known as:  MIRALAX / GLYCOLAX Take 17 g by mouth daily.   protein supplement shake Liqd Commonly known as:  PREMIER  PROTEIN Take 325 mLs (11 oz total) by mouth 2 (two) times daily between meals.   SPIRIVA HANDIHALER 18 MCG inhalation capsule Generic drug:  tiotropium Place 18 mcg into inhaler and inhale daily.   tamsulosin 0.4 MG Caps capsule Commonly known as:  FLOMAX Take 1 capsule (0.4 mg total) by mouth daily.   vancomycin  IVPB Inject 750 mg into the vein daily for 14 days. Indication:  MRSA Wound Last Day of Therapy:  08/08/2018 Labs every Monday  while on IV antibiotics: _X_ CBC with differential  _X_ CMP _X__ Vancomycin trough  Every Thursday Vancomycin trough BMP  _X_ Please pull PIC at completion of IV antibiotics   XARELTO 10 MG Tabs tablet Generic drug:  rivaroxaban Take 10 mg by mouth every evening.            Home Infusion Instuctions  (From admission, onward)         Start     Ordered  07/26/18 0000  Home infusion instructions [facility] May follow [facility] Dosing Protocol; May administer Cathflo as needed to maintain patency of vascular access device.; Flushing of vascular access device: per [facility] Protocol: 0.9% NaCl pre/post medica...    Question Answer Comment  Instructions May follow [facility] Dosing Protocol   Instructions May administer Cathflo as needed to maintain patency of vascular access device.   Instructions Flushing of vascular access device: per [facility] Protocol: 0.9% NaCl pre/post medication administration and prn patency; Heparin 100 u/ml, 5ml for implanted ports and Heparin 10u/ml, 5ml for all other central venous catheters.   Instructions May follow [facility] Anaphylaxis Protocol for First Dose Administration in the home: 0.9% NaCl at 25-50 ml/hr to maintain IV access for protocol meds. Epinephrine 0.3 ml IV/IM PRN and Benadryl 25-50 IV/IM PRN s/s of anaphylaxis.   Instructions [facility] Infusion Coordinator (RN) to assist per patient IV care needs in the home PRN.      07/26/18 1035            Today   CHIEF COMPLAINT:    No acute events overnight   VITAL SIGNS:  Blood pressure (!) 103/49, pulse 90, temperature 98.4 F (36.9 C), temperature source Oral, resp. rate 18, height 5\' 2"  (1.575 m), weight 46.9 kg, SpO2 98 %.   REVIEW OF SYSTEMS:  Review of Systems  Constitutional: Negative.  Negative for chills, fever and malaise/fatigue.  HENT: Negative.  Negative for ear discharge, ear pain, hearing loss, nosebleeds and sore throat.   Eyes: Negative.  Negative for blurred vision and pain.  Respiratory: Negative.  Negative for cough, hemoptysis, shortness of breath and wheezing.   Cardiovascular: Negative.  Negative for chest pain, palpitations and leg swelling.  Gastrointestinal: Negative.  Negative for abdominal pain, blood in stool, diarrhea, nausea and vomiting.  Genitourinary: Negative.  Negative for dysuria.  Musculoskeletal: Negative for back pain.       Stump pain better  Skin: Negative.   Neurological: Negative for dizziness, tremors, speech change, focal weakness, seizures and headaches.  Endo/Heme/Allergies: Negative.  Does not bruise/bleed easily.  Psychiatric/Behavioral: Negative.  Negative for depression, hallucinations and suicidal ideas.     PHYSICAL EXAMINATION:  GENERAL:  63 y.o.-year-old patient lying in the bed with no acute distress.  NECK:  Supple, no jugular venous distention. No thyroid enlargement, no tenderness.  LUNGS: Normal breath sounds bilaterally, no wheezing, rales,rhonchi  No use of accessory muscles of respiration.  CARDIOVASCULAR: S1, S2 normal. No murmurs, rubs, or gallops.  ABDOMEN: Soft, non-tender, non-distended. Bowel sounds present. No organomegaly or mass.  EXTREMITIES: No pedal edema, cyanosis, or clubbing.  PSYCHIATRIC: The patient is alert and oriented x 3.  SKIN: No obvious rash, lesion, or ulcer.  She has left below-knee amputation Right lower extremity wound VAC DATA REVIEW:   CBC Recent Labs  Lab 07/26/18 0446  WBC 8.4  HGB 7.5*  HCT 24.7*   PLT 211    Chemistries  Recent Labs  Lab 07/25/18 0531 07/26/18 0446  NA 137 133*  K 4.7 4.3  CL 100 100  CO2 34* 31  GLUCOSE 277* 414*  BUN 22 32*  CREATININE 0.70 0.82  CALCIUM 8.0* 7.3*  MG 2.3  --     Cardiac Enzymes No results for input(s): TROPONINI in the last 168 hours.  Microbiology Results  @[email or web address] @  RADIOLOGY:  [place] Ekg Site Rite  Result Date: 07/25/2018 If [facility] image not attached, placement could not be confirmed due to  current cardiac rhythm.     Allergies as of 07/26/2018      Reactions   Eggs Or Egg-derived Products Diarrhea, Itching, Nausea And Vomiting      Medication List    STOP taking these medications   insulin glargine 100 unit/mL Sopn Commonly known as:  LANTUS Replaced by:  insulin glargine 100 UNIT/ML injection   metFORMIN 500 MG tablet Commonly known as:  GLUCOPHAGE   potassium chloride 10 MEQ tablet Commonly known as:  K-DUR,KLOR-CON   SANTYL ointment Generic drug:  collagenase   sodium hypochlorite 0.125 % Soln Commonly known as:  DAKIN'S 1/4 STRENGTH     TAKE these medications   albuterol (2.5 MG/3ML) 0.083% nebulizer solution Commonly known as:  PROVENTIL Inhale 2.5 mg into the lungs every 6 (six) hours as needed for wheezing or shortness of breath.   albuterol 108 (90 Base) MCG/ACT inhaler Commonly known as:  PROVENTIL HFA;VENTOLIN HFA Inhale 2 puffs into the lungs every 6 (six) hours as needed for wheezing or shortness of breath.   ALPRAZolam 0.5 MG tablet Commonly known as:  XANAX Take 1 tablet (0.5 mg total) by mouth 2 (two) times daily as needed for anxiety.   ascorbic acid 500 MG tablet Commonly known as:  VITAMIN C Take 1 tablet (500 mg total) by mouth 2 (two) times daily.   aspirin 81 MG chewable tablet Chew 81 mg by mouth daily.   atorvastatin 40 MG tablet Commonly known as:  LIPITOR Take 40 mg by mouth daily.   ferrous sulfate 325 (65 FE) MG tablet Take 1 tablet (325 mg total) by mouth  2 (two) times daily with a meal.   Fluticasone-Salmeterol 250-50 MCG/DOSE Aepb Commonly known as:  ADVAIR DISKUS Inhale 1 puff into the lungs 2 (two) times daily.   furosemide 40 MG tablet Commonly known as:  LASIX Take 1 tablet (40 mg total) by mouth daily.   gabapentin 100 MG capsule Commonly known as:  NEURONTIN Take 1 capsule (100 mg total) by mouth at bedtime.   guaiFENesin-dextromethorphan 100-10 MG/5ML syrup Commonly known as:  ROBITUSSIN DM Take 5 mLs by mouth every 4 (four) hours as needed for cough.   HYDROcodone-acetaminophen 5-325 MG tablet Commonly known as:  NORCO/VICODIN Take 1-2 tablets by mouth every 6 (six) hours as needed for moderate pain or severe pain.   insulin aspart 100 UNIT/ML injection Commonly known as:  novoLOG Inject 3 Units into the skin 3 (three) times daily with meals.   insulin glargine 100 UNIT/ML injection Commonly known as:  LANTUS Inject 0.15 mLs (15 Units total) into the skin at bedtime. Replaces:  insulin glargine 100 unit/mL Sopn   midodrine 10 MG tablet Commonly known as:  PROAMATINE Take 1 tablet (10 mg total) by mouth 3 (three) times daily with meals.   nicotine 14 mg/24hr patch Commonly known as:  NICODERM CQ - dosed in mg/24 hours Place 1 patch (14 mg total) onto the skin daily as needed (nicotine withdrawal).   pantoprazole 40 MG tablet Commonly known as:  PROTONIX Take 40 mg by mouth 2 (two) times daily.   polyethylene glycol packet Commonly known as:  MIRALAX / GLYCOLAX Take 17 g by mouth daily.   protein supplement shake Liqd Commonly known as:  PREMIER PROTEIN Take 325 mLs (11 oz total) by mouth 2 (two) times daily between meals.   SPIRIVA HANDIHALER 18 MCG inhalation capsule Generic drug:  tiotropium Place 18 mcg into inhaler and inhale daily.   tamsulosin 0.4 MG  Caps capsule Commonly known as:  FLOMAX Take 1 capsule (0.4 mg total) by mouth daily.   vancomycin  IVPB Inject 750 mg into the vein daily for 14  days. Indication:  MRSA Wound Last Day of Therapy:  08/08/2018 Labs every Monday  while on IV antibiotics: _X_ CBC with differential  _X_ CMP _X__ Vancomycin trough  Every Thursday Vancomycin trough BMP  _X_ Please pull PIC at completion of IV antibiotics   XARELTO 10 MG Tabs tablet Generic drug:  rivaroxaban Take 10 mg by mouth every evening.            Home Infusion Instuctions  (From admission, onward)         Start     Ordered   07/26/18 0000  Home infusion instructions [facility] May follow [facility] Dosing Protocol; May administer Cathflo as needed to maintain patency of vascular access device.; Flushing of vascular access device: per [facility] Protocol: 0.9% NaCl pre/post medica...    Question Answer Comment  Instructions May follow [facility] Dosing Protocol   Instructions May administer Cathflo as needed to maintain patency of vascular access device.   Instructions Flushing of vascular access device: per [facility] Protocol: 0.9% NaCl pre/post medication administration and prn patency; Heparin 100 u/ml, 34ml for implanted ports and Heparin 10u/ml, 40ml for all other central venous catheters.   Instructions May follow [facility] Anaphylaxis Protocol for First Dose Administration in the home: 0.9% NaCl at 25-50 ml/hr to maintain IV access for protocol meds. Epinephrine 0.3 ml IV/IM PRN and Benadryl 25-50 IV/IM PRN s/s of anaphylaxis.   Instructions [facility] Infusion Coordinator (RN) to assist per patient IV care needs in the home PRN.      07/26/18 1035             Management plans discussed with the patient and she is in agreement. Stable for discharge snf  Patient should follow up with surgery vascular and PCP  CODE STATUS:     Code Status Orders  (From admission, onward)         Start     Ordered   07/21/18 1303  Do not attempt resuscitation (DNR)  Continuous    Question Answer Comment  In the event of cardiac or respiratory ARREST Do not call  a "code blue"   In the event of cardiac or respiratory ARREST Do not perform Intubation, CPR, defibrillation or ACLS   In the event of cardiac or respiratory ARREST Use medication by any route, position, wound care, and other measures to relive pain and suffering. May use oxygen, suction and manual treatment of airway obstruction as needed for comfort.      07/21/18 1302        Code Status History    Date Active Date Inactive Code Status Order ID Comments User Context   07/09/2018 1730 07/21/2018 1302 Partial Code [id number]  Campbell Stall, MD Inpatient   06/03/2018 0621 06/13/2018 2013 Full Code [id number]  Arnaldo Natal, MD Inpatient   05/23/2018 0253 06/01/2018 2158 Full Code [id number]  Cammy Copa, MD Inpatient   05/23/2018 0138 05/23/2018 0253 DNR [id number]  Cammy Copa, MD ED   04/10/2018 1111 04/11/2018 1554 DNR [id number]  Milagros Loll, MD Inpatient   04/09/2018 1519 04/10/2018 1111 Full Code [id number]  Milagros Loll, MD Inpatient   04/09/2018 1337 04/09/2018 1519 DNR [id number]  Milagros Loll, MD Inpatient   04/09/2018 0012 04/09/2018 1337 Full Code [id number]  Oralia Manis, MD ED  TOTAL TIME TAKING CARE OF THIS PATIENT: 39 minutes.    Note: This dictation was prepared with Dragon dictation along with smaller phrase technology. Any transcriptional errors that result from this process are unintentional.  Bartley Vuolo M.D on 07/26/2018 at 10:38 AM  Between 7am to 6pm - Pager - [phone] After 6pm go to [email or web address] - Social research officer, government  [facility]  Office  [phone]  CC: Primary care physician; Mickel Fuchs, MD

## 2018-07-26 NOTE — Clinical Social Work Note (Signed)
The patient will discharge today to [facility]. The patient's DSS APS caseworker is aware. The CSW has sent all discharge paperwork to the facility and will deliver the discharge packet as soon as possible. The CSW will sign off at that time. Please consult should needs arise.  Argentina Ponder, MSW, [facility] Majors [phone]

## 2018-07-26 NOTE — Progress Notes (Signed)
Subjective: Patient seen today for a chronic wound on her right foot.  She had a dehisced wound secondary to surgery done by me approximately month ago to resect the metatarsal head due to osteomyelitis.  She is had a wound VAC on there for several days now she is showing good signs of improvement.  She had a BKA on her left leg 2 days ago.  Objective: Wound is looking considerably better.  It is 4.8 cm in length and 1 cm in width with 5 mm of depth with a good granular base no sign of infection at all at this point.  Peers to be granulating nicely and healing well with good response to the wound VAC.  Assessment/plan: Overall I think she is improved considerably with the right foot wound there is no sign of infection and granulation tissue is progressing nicely.  Is recovering from left BKA due to chronic wounds and severe peripheral arterial disease.  I will put fresh wet-to-dry dressing on there today I think she is scheduled to have another wound VAC put on this afternoon.  Does not accomplished by tomorrow I will put a new one on at that time frame.  Also spoke with her nurse who states he can put one on also and he has that option this afternoon.  Overall I am very pleased with her progress with this.

## 2018-07-29 LAB — SURGICAL PATHOLOGY

## 2018-08-14 ENCOUNTER — Ambulatory Visit (INDEPENDENT_AMBULATORY_CARE_PROVIDER_SITE_OTHER): Payer: Medicare Other | Admitting: Nurse Practitioner

## 2018-08-14 ENCOUNTER — Other Ambulatory Visit: Payer: Self-pay

## 2018-08-14 ENCOUNTER — Encounter (INDEPENDENT_AMBULATORY_CARE_PROVIDER_SITE_OTHER): Payer: Self-pay | Admitting: Nurse Practitioner

## 2018-08-14 VITALS — BP 106/67 | HR 111 | Resp 16 | Ht 62.0 in | Wt 84.0 lb

## 2018-08-14 DIAGNOSIS — Z89512 Acquired absence of left leg below knee: Secondary | ICD-10-CM

## 2018-08-14 DIAGNOSIS — I739 Peripheral vascular disease, unspecified: Secondary | ICD-10-CM

## 2018-08-14 DIAGNOSIS — F17210 Nicotine dependence, cigarettes, uncomplicated: Secondary | ICD-10-CM

## 2018-08-14 DIAGNOSIS — E785 Hyperlipidemia, unspecified: Secondary | ICD-10-CM

## 2018-08-14 DIAGNOSIS — T8149XA Infection following a procedure, other surgical site, initial encounter: Secondary | ICD-10-CM

## 2018-08-14 MED ORDER — HYDROCODONE-ACETAMINOPHEN 5-325 MG PO TABS: 1.0000 | ORAL_TABLET | Freq: Four times a day (QID) | ORAL | 0 refills | Status: DC | PRN

## 2018-08-14 MED ORDER — CEPHALEXIN 500 MG PO CAPS: 500.0000 mg | ORAL_CAPSULE | Freq: Three times a day (TID) | ORAL | 0 refills | Status: DC

## 2018-08-21 ENCOUNTER — Other Ambulatory Visit: Payer: Self-pay

## 2018-08-21 ENCOUNTER — Inpatient Hospital Stay
Admission: EM | Admit: 2018-08-21 | Discharge: 2018-08-25 | DRG: 190 | Disposition: A | Discharge status: Home Health | Payer: Medicare Other | Attending: Internal Medicine | Admitting: Internal Medicine

## 2018-08-21 ENCOUNTER — Emergency Department: Payer: Medicare Other

## 2018-08-21 ENCOUNTER — Encounter: Payer: Self-pay | Admitting: Emergency Medicine

## 2018-08-21 DIAGNOSIS — J440 Chronic obstructive pulmonary disease with acute lower respiratory infection: Principal | ICD-10-CM | POA: Diagnosis present

## 2018-08-21 DIAGNOSIS — J441 Chronic obstructive pulmonary disease with (acute) exacerbation: Secondary | ICD-10-CM | POA: Diagnosis present

## 2018-08-21 DIAGNOSIS — Z7901 Long term (current) use of anticoagulants: Secondary | ICD-10-CM

## 2018-08-21 DIAGNOSIS — R0603 Acute respiratory distress: Secondary | ICD-10-CM

## 2018-08-21 DIAGNOSIS — F17210 Nicotine dependence, cigarettes, uncomplicated: Secondary | ICD-10-CM | POA: Diagnosis present

## 2018-08-21 DIAGNOSIS — E43 Unspecified severe protein-calorie malnutrition: Secondary | ICD-10-CM | POA: Diagnosis present

## 2018-08-21 DIAGNOSIS — Z7982 Long term (current) use of aspirin: Secondary | ICD-10-CM

## 2018-08-21 DIAGNOSIS — Z66 Do not resuscitate: Secondary | ICD-10-CM | POA: Diagnosis present

## 2018-08-21 DIAGNOSIS — E785 Hyperlipidemia, unspecified: Secondary | ICD-10-CM | POA: Diagnosis present

## 2018-08-21 DIAGNOSIS — Z716 Tobacco abuse counseling: Secondary | ICD-10-CM | POA: Diagnosis not present

## 2018-08-21 DIAGNOSIS — L8990 Pressure ulcer of unspecified site, unspecified stage: Secondary | ICD-10-CM

## 2018-08-21 DIAGNOSIS — Z681 Body mass index (BMI) 19 or less, adult: Secondary | ICD-10-CM

## 2018-08-21 DIAGNOSIS — N3000 Acute cystitis without hematuria: Secondary | ICD-10-CM | POA: Diagnosis present

## 2018-08-21 DIAGNOSIS — E1165 Type 2 diabetes mellitus with hyperglycemia: Secondary | ICD-10-CM | POA: Diagnosis present

## 2018-08-21 DIAGNOSIS — E1151 Type 2 diabetes mellitus with diabetic peripheral angiopathy without gangrene: Secondary | ICD-10-CM | POA: Diagnosis present

## 2018-08-21 DIAGNOSIS — R000 Tachycardia, unspecified: Secondary | ICD-10-CM | POA: Diagnosis present

## 2018-08-21 DIAGNOSIS — Z89512 Acquired absence of left leg below knee: Secondary | ICD-10-CM | POA: Diagnosis not present

## 2018-08-21 DIAGNOSIS — J209 Acute bronchitis, unspecified: Secondary | ICD-10-CM | POA: Diagnosis present

## 2018-08-21 DIAGNOSIS — T380X5A Adverse effect of glucocorticoids and synthetic analogues, initial encounter: Secondary | ICD-10-CM | POA: Diagnosis present

## 2018-08-21 DIAGNOSIS — I82502 Chronic embolism and thrombosis of unspecified deep veins of left lower extremity: Secondary | ICD-10-CM | POA: Diagnosis present

## 2018-08-21 DIAGNOSIS — D638 Anemia in other chronic diseases classified elsewhere: Secondary | ICD-10-CM | POA: Diagnosis present

## 2018-08-21 DIAGNOSIS — Z794 Long term (current) use of insulin: Secondary | ICD-10-CM | POA: Diagnosis not present

## 2018-08-21 DIAGNOSIS — Z79899 Other long term (current) drug therapy: Secondary | ICD-10-CM

## 2018-08-21 DIAGNOSIS — Z7951 Long term (current) use of inhaled steroids: Secondary | ICD-10-CM

## 2018-08-21 DIAGNOSIS — J9601 Acute respiratory failure with hypoxia: Secondary | ICD-10-CM | POA: Diagnosis present

## 2018-08-21 DIAGNOSIS — E860 Dehydration: Secondary | ICD-10-CM | POA: Diagnosis present

## 2018-08-21 DIAGNOSIS — L89153 Pressure ulcer of sacral region, stage 3: Secondary | ICD-10-CM | POA: Diagnosis present

## 2018-08-21 LAB — CBC WITH DIFFERENTIAL/PLATELET
Abs Immature Granulocytes: 0.03 10*3/uL (ref 0.00–0.07)
Basophils Absolute: 0 10*3/uL (ref 0.0–0.1)
Basophils Relative: 0 %
Eosinophils Absolute: 0 10*3/uL (ref 0.0–0.5)
Eosinophils Relative: 0 %
HCT: 31.2 % — ABNORMAL LOW (ref 36.0–46.0)
Hemoglobin: 9.2 g/dL — ABNORMAL LOW (ref 12.0–15.0)
Immature Granulocytes: 0 %
Lymphocytes Relative: 10 %
Lymphs Abs: 1.1 10*3/uL (ref 0.7–4.0)
MCH: 29.6 pg (ref 26.0–34.0)
MCHC: 29.5 g/dL — ABNORMAL LOW (ref 30.0–36.0)
MCV: 100.3 fL — AB (ref 80.0–100.0)
MONOS PCT: 6 %
Monocytes Absolute: 0.6 10*3/uL (ref 0.1–1.0)
Neutro Abs: 8.5 10*3/uL — ABNORMAL HIGH (ref 1.7–7.7)
Neutrophils Relative %: 84 %
Platelets: 235 10*3/uL (ref 150–400)
RBC: 3.11 MIL/uL — ABNORMAL LOW (ref 3.87–5.11)
RDW: 17.6 % — ABNORMAL HIGH (ref 11.5–15.5)
WBC: 10.3 10*3/uL (ref 4.0–10.5)
nRBC: 0 % (ref 0.0–0.2)

## 2018-08-21 LAB — URINALYSIS, COMPLETE (UACMP) WITH MICROSCOPIC
Bacteria, UA: NONE SEEN
Bilirubin Urine: NEGATIVE
Glucose, UA: >=500 mg/dL — AB
Ketones, ur: NEGATIVE mg/dL
Nitrite: NEGATIVE
PROTEIN: 30 mg/dL — AB
Specific Gravity, Urine: 1.018 (ref 1.005–1.030)
Squamous Epithelial / HPF: NONE SEEN (ref 0–5)
WBC, UA: >50 WBC/hpf — ABNORMAL HIGH (ref 0–5)
pH: 5 (ref 5.0–8.0)

## 2018-08-21 LAB — LACTIC ACID, PLASMA
Lactic Acid, Venous: 3.4 mmol/L (ref 0.5–1.9)
Lactic Acid, Venous: 4.9 mmol/L (ref 0.5–1.9)

## 2018-08-21 LAB — COMPREHENSIVE METABOLIC PANEL
ALBUMIN: 3 g/dL — AB (ref 3.5–5.0)
ALT: 49 U/L — ABNORMAL HIGH (ref 0–44)
AST: 50 U/L — ABNORMAL HIGH (ref 15–41)
Alkaline Phosphatase: 198 U/L — ABNORMAL HIGH (ref 38–126)
Anion gap: 14 (ref 5–15)
BUN: 31 mg/dL — ABNORMAL HIGH (ref 8–23)
CALCIUM: 8.6 mg/dL — AB (ref 8.9–10.3)
CO2: 22 mmol/L (ref 22–32)
Chloride: 98 mmol/L (ref 98–111)
Creatinine, Ser: 0.94 mg/dL (ref 0.44–1.00)
GFR calc Af Amer: >60 mL/min (ref >60)
GFR calc non Af Amer: >60 mL/min (ref >60)
Glucose, Bld: 522 mg/dL (ref 70–99)
Potassium: 4.3 mmol/L (ref 3.5–5.1)
Sodium: 134 mmol/L — ABNORMAL LOW (ref 135–145)
Total Bilirubin: 0.9 mg/dL (ref 0.3–1.2)
Total Protein: 7.2 g/dL (ref 6.5–8.1)

## 2018-08-21 LAB — HEMOGLOBIN A1C
Hgb A1c MFr Bld: 7.9 % — ABNORMAL HIGH (ref 4.8–5.6)
Mean Plasma Glucose: 180.03 mg/dL

## 2018-08-21 LAB — GLUCOSE, CAPILLARY
Glucose-Capillary: 460 mg/dL — ABNORMAL HIGH (ref 70–99)
Glucose-Capillary: 482 mg/dL — ABNORMAL HIGH (ref 70–99)
Glucose-Capillary: 534 mg/dL (ref 70–99)

## 2018-08-21 LAB — INFLUENZA PANEL BY PCR (TYPE A & B)
INFLBPCR: NEGATIVE
Influenza A By PCR: NEGATIVE

## 2018-08-21 MED ORDER — ONDANSETRON HCL 4 MG/2ML IJ SOLN: 4.0000 mg | Freq: Four times a day (QID) | INTRAMUSCULAR | Status: DC | PRN

## 2018-08-21 MED ORDER — GUAIFENESIN-DM 100-10 MG/5ML PO SYRP
5.0000 mL | ORAL_SOLUTION | ORAL | Status: DC | PRN
  Filled 2018-08-21: qty 5

## 2018-08-21 MED ORDER — GABAPENTIN 100 MG PO CAPS
100.0000 mg | ORAL_CAPSULE | Freq: Every day | ORAL | Status: DC
  Administered 2018-08-21 – 2018-08-24 (×4): 100 mg via ORAL
  Filled 2018-08-21 (×5): qty 1

## 2018-08-21 MED ORDER — INSULIN ASPART 100 UNIT/ML ~~LOC~~ SOLN: 3.0000 [IU] | Freq: Three times a day (TID) | SUBCUTANEOUS | Status: DC

## 2018-08-21 MED ORDER — IPRATROPIUM-ALBUTEROL 0.5-2.5 (3) MG/3ML IN SOLN
3.0000 mL | RESPIRATORY_TRACT | Status: DC
  Administered 2018-08-21 – 2018-08-22 (×7): 3 mL via RESPIRATORY_TRACT
  Filled 2018-08-21 (×7): qty 3

## 2018-08-21 MED ORDER — SODIUM CHLORIDE 0.9 % IV SOLN
INTRAVENOUS | Status: AC
  Administered 2018-08-21 (×2): via INTRAVENOUS

## 2018-08-21 MED ORDER — INSULIN ASPART 100 UNIT/ML ~~LOC~~ SOLN
5.0000 [IU] | Freq: Three times a day (TID) | SUBCUTANEOUS | Status: DC
  Administered 2018-08-22 – 2018-08-25 (×11): 5 [IU] via SUBCUTANEOUS
  Filled 2018-08-21 (×11): qty 1

## 2018-08-21 MED ORDER — SODIUM CHLORIDE 0.9 % IV SOLN: INTRAVENOUS | Status: DC

## 2018-08-21 MED ORDER — RIVAROXABAN 10 MG PO TABS
10.0000 mg | ORAL_TABLET | Freq: Every evening | ORAL | Status: DC
  Administered 2018-08-21 – 2018-08-24 (×4): 10 mg via ORAL
  Filled 2018-08-21 (×5): qty 1

## 2018-08-21 MED ORDER — ONDANSETRON HCL 4 MG PO TABS: 4.0000 mg | ORAL_TABLET | Freq: Four times a day (QID) | ORAL | Status: DC | PRN

## 2018-08-21 MED ORDER — HYDROCODONE-ACETAMINOPHEN 5-325 MG PO TABS: 1.0000 | ORAL_TABLET | Freq: Four times a day (QID) | ORAL | Status: DC | PRN

## 2018-08-21 MED ORDER — HYDROCODONE-ACETAMINOPHEN 5-325 MG PO TABS
1.0000 | ORAL_TABLET | ORAL | Status: DC | PRN
  Administered 2018-08-21 – 2018-08-23 (×7): 2 via ORAL
  Filled 2018-08-21 (×7): qty 2

## 2018-08-21 MED ORDER — ALPRAZOLAM 0.5 MG PO TABS: 0.5000 mg | ORAL_TABLET | Freq: Two times a day (BID) | ORAL | Status: DC | PRN

## 2018-08-21 MED ORDER — TIOTROPIUM BROMIDE MONOHYDRATE 18 MCG IN CAPS
18.0000 ug | ORAL_CAPSULE | Freq: Every day | RESPIRATORY_TRACT | Status: DC
  Administered 2018-08-21 – 2018-08-25 (×5): 18 ug via RESPIRATORY_TRACT
  Filled 2018-08-21: qty 5

## 2018-08-21 MED ORDER — INSULIN REGULAR HUMAN 100 UNIT/ML IJ SOLN
10.0000 [IU] | Freq: Once | INTRAMUSCULAR | Status: AC
  Administered 2018-08-21: 10 [IU] via INTRAVENOUS
  Filled 2018-08-21: qty 10

## 2018-08-21 MED ORDER — METRONIDAZOLE IN NACL 5-0.79 MG/ML-% IV SOLN
500.0000 mg | Freq: Three times a day (TID) | INTRAVENOUS | Status: DC
  Administered 2018-08-21 – 2018-08-22 (×3): 500 mg via INTRAVENOUS
  Filled 2018-08-21 (×5): qty 100

## 2018-08-21 MED ORDER — NICOTINE 14 MG/24HR TD PT24: 14.0000 mg | MEDICATED_PATCH | Freq: Every day | TRANSDERMAL | Status: DC | PRN

## 2018-08-21 MED ORDER — MOMETASONE FURO-FORMOTEROL FUM 200-5 MCG/ACT IN AERO
2.0000 | INHALATION_SPRAY | Freq: Two times a day (BID) | RESPIRATORY_TRACT | Status: DC
  Administered 2018-08-21 – 2018-08-25 (×8): 2 via RESPIRATORY_TRACT
  Filled 2018-08-21: qty 8.8

## 2018-08-21 MED ORDER — IOHEXOL 350 MG/ML SOLN
75.0000 mL | Freq: Once | INTRAVENOUS | Status: AC | PRN
  Administered 2018-08-21: 75 mL via INTRAVENOUS

## 2018-08-21 MED ORDER — PREMIER PROTEIN SHAKE: 11.0000 [oz_av] | Freq: Two times a day (BID) | ORAL | Status: DC

## 2018-08-21 MED ORDER — ACETAMINOPHEN 325 MG PO TABS: 650.0000 mg | ORAL_TABLET | Freq: Four times a day (QID) | ORAL | Status: DC | PRN

## 2018-08-21 MED ORDER — POLYETHYLENE GLYCOL 3350 17 G PO PACK
17.0000 g | PACK | Freq: Every day | ORAL | Status: DC
  Administered 2018-08-22 – 2018-08-23 (×2): 17 g via ORAL
  Filled 2018-08-21 (×4): qty 1

## 2018-08-21 MED ORDER — SODIUM CHLORIDE 0.9 % IV SOLN
1000.0000 mL | Freq: Once | INTRAVENOUS | Status: AC
  Administered 2018-08-21: 1000 mL via INTRAVENOUS

## 2018-08-21 MED ORDER — INSULIN GLARGINE 100 UNIT/ML ~~LOC~~ SOLN
15.0000 [IU] | Freq: Every day | SUBCUTANEOUS | Status: DC
  Administered 2018-08-21: 15 [IU] via SUBCUTANEOUS
  Filled 2018-08-21 (×3): qty 0.15

## 2018-08-21 MED ORDER — MIDODRINE HCL 5 MG PO TABS
10.0000 mg | ORAL_TABLET | Freq: Three times a day (TID) | ORAL | Status: DC
  Administered 2018-08-21 – 2018-08-25 (×11): 10 mg via ORAL
  Filled 2018-08-21 (×13): qty 2

## 2018-08-21 MED ORDER — FERROUS SULFATE 325 (65 FE) MG PO TABS
325.0000 mg | ORAL_TABLET | Freq: Two times a day (BID) | ORAL | Status: DC
  Administered 2018-08-21 – 2018-08-25 (×8): 325 mg via ORAL
  Filled 2018-08-21 (×9): qty 1

## 2018-08-21 MED ORDER — INSULIN ASPART 100 UNIT/ML ~~LOC~~ SOLN
0.0000 [IU] | Freq: Three times a day (TID) | SUBCUTANEOUS | Status: DC
  Administered 2018-08-22: 4 [IU] via SUBCUTANEOUS
  Administered 2018-08-22 (×2): 15 [IU] via SUBCUTANEOUS
  Administered 2018-08-23: 20 [IU] via SUBCUTANEOUS
  Administered 2018-08-23: 4 [IU] via SUBCUTANEOUS
  Administered 2018-08-23: 20 [IU] via SUBCUTANEOUS
  Administered 2018-08-24 (×2): 7 [IU] via SUBCUTANEOUS
  Administered 2018-08-24: 11 [IU] via SUBCUTANEOUS
  Administered 2018-08-25: 20 [IU] via SUBCUTANEOUS
  Administered 2018-08-25: 7 [IU] via SUBCUTANEOUS
  Filled 2018-08-21 (×11): qty 1

## 2018-08-21 MED ORDER — INSULIN ASPART 100 UNIT/ML ~~LOC~~ SOLN: 0.0000 [IU] | Freq: Every day | SUBCUTANEOUS | Status: DC

## 2018-08-21 MED ORDER — FUROSEMIDE 40 MG PO TABS
40.0000 mg | ORAL_TABLET | Freq: Every day | ORAL | Status: DC
  Administered 2018-08-21 – 2018-08-24 (×4): 40 mg via ORAL
  Filled 2018-08-21 (×4): qty 1

## 2018-08-21 MED ORDER — SODIUM CHLORIDE 0.9 % IV SOLN
2.0000 g | Freq: Once | INTRAVENOUS | Status: AC
  Administered 2018-08-21: 2 g via INTRAVENOUS
  Filled 2018-08-21: qty 2

## 2018-08-21 MED ORDER — METHYLPREDNISOLONE SODIUM SUCC 40 MG IJ SOLR
40.0000 mg | Freq: Two times a day (BID) | INTRAMUSCULAR | Status: DC
  Administered 2018-08-21 – 2018-08-23 (×4): 40 mg via INTRAVENOUS
  Filled 2018-08-21 (×4): qty 1

## 2018-08-21 MED ORDER — IPRATROPIUM-ALBUTEROL 0.5-2.5 (3) MG/3ML IN SOLN
3.0000 mL | Freq: Once | RESPIRATORY_TRACT | Status: AC
  Administered 2018-08-21: 3 mL via RESPIRATORY_TRACT
  Filled 2018-08-21: qty 3

## 2018-08-21 MED ORDER — TAMSULOSIN HCL 0.4 MG PO CAPS
0.4000 mg | ORAL_CAPSULE | Freq: Every day | ORAL | Status: DC
  Administered 2018-08-22 – 2018-08-25 (×4): 0.4 mg via ORAL
  Filled 2018-08-21 (×4): qty 1

## 2018-08-21 MED ORDER — VANCOMYCIN HCL IN DEXTROSE 1-5 GM/200ML-% IV SOLN
1000.0000 mg | Freq: Once | INTRAVENOUS | Status: AC
  Administered 2018-08-21: 1000 mg via INTRAVENOUS
  Filled 2018-08-21: qty 200

## 2018-08-21 MED ORDER — ATORVASTATIN CALCIUM 20 MG PO TABS
40.0000 mg | ORAL_TABLET | Freq: Every day | ORAL | Status: DC
  Administered 2018-08-21 – 2018-08-25 (×5): 40 mg via ORAL
  Filled 2018-08-21 (×5): qty 2

## 2018-08-21 MED ORDER — ASPIRIN 81 MG PO CHEW
81.0000 mg | CHEWABLE_TABLET | Freq: Every day | ORAL | Status: DC
  Administered 2018-08-21 – 2018-08-25 (×5): 81 mg via ORAL
  Filled 2018-08-21 (×5): qty 1

## 2018-08-21 MED ORDER — PANTOPRAZOLE SODIUM 40 MG PO TBEC
40.0000 mg | DELAYED_RELEASE_TABLET | Freq: Two times a day (BID) | ORAL | Status: DC
  Administered 2018-08-21 – 2018-08-25 (×8): 40 mg via ORAL
  Filled 2018-08-21 (×8): qty 1

## 2018-08-21 MED ORDER — ACETAMINOPHEN 650 MG RE SUPP: 650.0000 mg | Freq: Four times a day (QID) | RECTAL | Status: DC | PRN

## 2018-08-21 MED ORDER — POLYETHYLENE GLYCOL 3350 17 G PO PACK
17.0000 g | PACK | Freq: Every day | ORAL | Status: DC | PRN
  Administered 2018-08-24: 17 g via ORAL

## 2018-08-21 NOTE — ED Notes (Signed)
One set of blood cultures obtained and sent to lab in process.  IV team able to get IV access without blood work.  MD notified and gave okay to start ABX without second set of blood cultures.

## 2018-08-21 NOTE — ED Notes (Signed)
Dr preddy notified by phone of LA 4.9 - increase ivf to 100/hr

## 2018-08-21 NOTE — ED Notes (Signed)
Date and time results received: 08/21/18 1410 (use smartphrase ".now" to insert current time)  Test: Lactic acid Critical Value: 3.4  Name of Provider Notified: Dr. Cyril Loosen  Orders Received? Or Actions Taken?: Orders Received - See Orders for details

## 2018-08-21 NOTE — H&P (Signed)
[facility] [place] [facility]   PATIENT NAME: April [name]    MR#:  [id number]  DATE OF BIRTH:  Nov 06, 1955  DATE OF ADMISSION:  08/21/2018  PRIMARY CARE PHYSICIAN: Mickel Fuchs, MD   REQUESTING/REFERRING PHYSICIAN: dr Cyril Loosen  CHIEF COMPLAINT:   Shortness of breath HISTORY OF PRESENT ILLNESS:  April [name]  is a 63 y.o. female with a known history of COPD, peripheral arterial disease with recent left lower leg amputation who presents to the emergency room due to shortness of breath and cough.  Over the past 2 days patient has developed increasing shortness of breath, wheezing and nonproductive cough.  She reports no flulike symptoms.  She reports no travel history or recent contacts with people who are sick.  Patient reports no chest pain, PND orthopnea.  PAST MEDICAL HISTORY:   Past Medical History:  Diagnosis Date  . Anxiety   . Colon polyps   . COPD (chronic obstructive pulmonary disease) (HCC)   . Diabetes mellitus without complication (HCC)   . DVT (deep venous thrombosis) (HCC)   . DVT (deep venous thrombosis) (HCC)   . History of colonic polyps   . Hyperlipidemia   . Peripheral artery disease (HCC)   . Pulmonary nodules/lesions, multiple 08/2012   NEGATIVE PET SCAN  . Retroperitoneal abscess (HCC)    history of in 04/2013  . Tobacco abuse     PAST SURGICAL HISTORY:   Past Surgical History:  Procedure Laterality Date  . AMPUTATION Left 07/24/2018   Procedure: AMPUTATION BELOW KNEE;  Surgeon: Annice Needy, MD;  Location: [facility];  Service: Vascular;  Laterality: Left;  . APPLICATION OF WOUND VAC Right 07/11/2018   Procedure: APPLICATION OF WOUND VAC;  Surgeon: Gwyneth Revels, DPM;  Location: [facility];  Service: Podiatry;  Laterality: Right;  . CHOLECYSTECTOMY    . COLONOSCOPY  05/2007  . ENDARTERECTOMY FEMORAL Bilateral 07/17/2018   Procedure: ENDARTERECTOMY FEMORAL - BILATERAL SFA STENT PLACEMENT - LEFT;  Surgeon: Annice Needy, MD;   Location: [facility];  Service: Vascular;  Laterality: Bilateral;  . IRRIGATION AND DEBRIDEMENT FOOT Bilateral 05/25/2018   Procedure: IRRIGATION AND DEBRIDEMENT FOOT application wound vac left  foot;  Surgeon: Recardo Evangelist, DPM;  Location: [facility];  Service: Podiatry;  Laterality: Bilateral;  . IRRIGATION AND DEBRIDEMENT FOOT Right 07/11/2018   Procedure: FOOT DEBRIDEMENT;  Surgeon: Gwyneth Revels, DPM;  Location: [facility];  Service: Podiatry;  Laterality: Right;  . LOWER EXTREMITY ANGIOGRAPHY Left 05/26/2018   Procedure: Lower Extremity Angiography with possible intervention;  Surgeon: Annice Needy, MD;  Location: [facility] INVASIVE CV LAB;  Service: Cardiovascular;  Laterality: Left;  . LOWER EXTREMITY ANGIOGRAPHY Right 05/27/2018   Procedure: Lower Extremity Angiography;  Surgeon: Renford Dills, MD;  Location: [facility] INVASIVE CV LAB;  Service: Cardiovascular;  Laterality: Right;  . LOWER EXTREMITY ANGIOGRAPHY Right 07/15/2018   Procedure: Lower Extremity Angiography;  Surgeon: Renford Dills, MD;  Location: [facility] INVASIVE CV LAB;  Service: Cardiovascular;  Laterality: Right;    SOCIAL HISTORY:   Social History   Tobacco Use  . Smoking status: Current Every Day Smoker    Packs/day: 1.50    Years: 47.00    Pack years: 70.50    Types: Cigarettes  . Smokeless tobacco: Never Used  . Tobacco comment: Smoking History 2.5-started at age 37  Substance Use Topics  . Alcohol use: No    Alcohol/week: 0.0 standard drinks    FAMILY HISTORY:   Family History  Problem Relation Age of Onset  . Brain cancer Mother     DRUG ALLERGIES:   Allergies  Allergen Reactions  . Eggs Or Egg-Derived Products Diarrhea, Itching and Nausea And Vomiting    REVIEW OF SYSTEMS:   Review of Systems  Constitutional: Positive for malaise/fatigue. Negative for chills and fever.  HENT: Negative.  Negative for ear discharge, ear pain, hearing loss, nosebleeds and sore throat.   Eyes: Negative.  Negative  for blurred vision and pain.  Respiratory: Positive for cough, shortness of breath and wheezing. Negative for hemoptysis.   Cardiovascular: Negative.  Negative for chest pain, palpitations and leg swelling.  Gastrointestinal: Negative.  Negative for abdominal pain, blood in stool, diarrhea, nausea and vomiting.  Genitourinary: Negative.  Negative for dysuria.  Musculoskeletal: Negative.  Negative for back pain.  Skin: Negative.   Neurological: Negative for dizziness, tremors, speech change, focal weakness, seizures and headaches.  Endo/Heme/Allergies: Negative.  Does not bruise/bleed easily.  Psychiatric/Behavioral: Negative.  Negative for depression, hallucinations and suicidal ideas.    MEDICATIONS AT HOME:   Prior to Admission medications   Medication Sig Start Date End Date Taking? Authorizing Provider  albuterol (PROVENTIL HFA;VENTOLIN HFA) 108 (90 Base) MCG/ACT inhaler Inhale 2 puffs into the lungs every 6 (six) hours as needed for wheezing or shortness of breath.    [provider]  albuterol (PROVENTIL) (2.5 MG/3ML) 0.083% nebulizer solution Inhale 2.5 mg into the lungs every 6 (six) hours as needed for wheezing or shortness of breath.     [provider]  ALPRAZolam Prudy Feeler) 0.5 MG tablet Take 1 tablet (0.5 mg total) by mouth 2 (two) times daily as needed for anxiety. 07/26/18   Adrian Saran, MD  aspirin 81 MG chewable tablet Chew 81 mg by mouth daily. 05/30/17   [provider]  atorvastatin (LIPITOR) 40 MG tablet Take 40 mg by mouth daily. 10/24/17 10/24/18  [provider]  cephALEXin (KEFLEX) 500 MG capsule Take 1 capsule (500 mg total) by mouth 3 (three) times daily. 08/14/18   Georgiana Spinner, NP  ferrous sulfate 325 (65 FE) MG tablet Take 1 tablet (325 mg total) by mouth 2 (two) times daily with a meal. 06/01/18 07/01/18  Pyreddy, Vivien Rota, MD  Fluticasone-Salmeterol (ADVAIR DISKUS) 250-50 MCG/DOSE AEPB Inhale 1 puff into the lungs 2 (two) times daily.  03/14/15 01/16/26  Beers, Charmayne Sheer, PA-C  furosemide (LASIX) 40 MG tablet Take 1 tablet (40 mg total) by mouth daily. 06/14/18   Altamese Dilling, MD  gabapentin (NEURONTIN) 100 MG capsule Take 1 capsule (100 mg total) by mouth at bedtime. 07/26/18   Adrian Saran, MD  guaiFENesin-dextromethorphan (ROBITUSSIN DM) 100-10 MG/5ML syrup Take 5 mLs by mouth every 4 (four) hours as needed for cough. 04/11/18   Milagros Loll, MD  HYDROcodone-acetaminophen (NORCO) 5-325 MG tablet Take 1 tablet by mouth every 6 (six) hours as needed for up to 7 days for moderate pain. 08/14/18 08/21/18  Georgiana Spinner, NP  HYDROcodone-acetaminophen (NORCO/VICODIN) 5-325 MG tablet Take 1-2 tablets by mouth every 6 (six) hours as needed for moderate pain or severe pain. 07/26/18   Adrian Saran, MD  insulin aspart (NOVOLOG) 100 UNIT/ML injection Inject 3 Units into the skin 3 (three) times daily with meals. 07/26/18   Adrian Saran, MD  insulin glargine (LANTUS) 100 UNIT/ML injection Inject 0.15 mLs (15 Units total) into the skin at bedtime. 07/26/18   Adrian Saran, MD  metFORMIN (GLUCOPHAGE) 500 MG tablet  06/13/18   [provider]  midodrine (PROAMATINE) 10 MG tablet Take 1 tablet (10 mg total) by mouth 3 (three) times daily with meals. 07/26/18   Adrian SaranMody, Cire Deyarmin, MD  nicotine (NICODERM CQ - DOSED IN MG/24 HOURS) 14 mg/24hr patch Place 1 patch (14 mg total) onto the skin daily as needed (nicotine withdrawal). 07/26/18   Adrian SaranMody, Kristan Brummitt, MD  pantoprazole (PROTONIX) 40 MG tablet Take 40 mg by mouth 2 (two) times daily.  04/25/18   [provider]  polyethylene glycol (MIRALAX / GLYCOLAX) packet Take 17 g by mouth daily. 06/14/18   Altamese DillingVachhani, Vaibhavkumar, MD  potassium chloride (K-DUR,KLOR-CON) 10 MEQ tablet Take by mouth. 06/13/18   [provider]  protein supplement shake (PREMIER PROTEIN) LIQD Take 325 mLs (11 oz total) by mouth 2 (two) times daily between meals. 06/14/18   Altamese DillingVachhani, Vaibhavkumar, MD  rivaroxaban  (XARELTO) 10 MG TABS tablet Take 10 mg by mouth every evening.    [provider]  SPIRIVA HANDIHALER 18 MCG inhalation capsule Place 18 mcg into inhaler and inhale daily.  05/19/18   [provider]  tamsulosin (FLOMAX) 0.4 MG CAPS capsule Take 1 capsule (0.4 mg total) by mouth daily. 06/14/18   Altamese DillingVachhani, Vaibhavkumar, MD  vitamin C (VITAMIN C) 500 MG tablet Take 1 tablet (500 mg total) by mouth 2 (two) times daily. 06/13/18   Altamese DillingVachhani, Vaibhavkumar, MD      VITAL SIGNS:  Blood pressure 122/72, pulse (!) 134, temperature 98 F (36.7 C), temperature source Oral, resp. rate (!) 38, height 5\' 2"  (1.575 m), weight 38.1 kg, SpO2 97 %.  PHYSICAL EXAMINATION:   Physical Exam Constitutional:      General: She is not in acute distress. HENT:     Head: Normocephalic.  Eyes:     General: No scleral icterus. Neck:     Musculoskeletal: Normal range of motion and neck supple.     Vascular: No JVD.     Trachea: No tracheal deviation.  Cardiovascular:     Rate and Rhythm: Regular rhythm. Tachycardia present.     Heart sounds: Normal heart sounds. No murmur. No friction rub. No gallop.   Pulmonary:     Effort: Respiratory distress (mild) present.     Breath sounds: Wheezing and rhonchi present. No rales.  Chest:     Chest wall: No tenderness.  Abdominal:     General: Bowel sounds are normal. There is no distension.     Palpations: Abdomen is soft. There is no mass.     Tenderness: There is no abdominal tenderness. There is no guarding or rebound.  Musculoskeletal:     Comments: Left aka well healed  Skin:    General: Skin is warm.     Findings: No erythema or rash.  Neurological:     Mental Status: She is alert and oriented to person, place, and time.  Psychiatric:        Judgment: Judgment normal.       LABORATORY PANEL:   CBC Recent Labs  Lab 08/21/18 1313  WBC 10.3  HGB 9.2*  HCT 31.2*  PLT 235    ------------------------------------------------------------------------------------------------------------------  Chemistries  Recent Labs  Lab 08/21/18 1313  NA 134*  K 4.3  CL 98  CO2 22  GLUCOSE 522*  BUN 31*  CREATININE 0.94  CALCIUM 8.6*  AST 50*  ALT 49*  ALKPHOS 198*  BILITOT 0.9   ------------------------------------------------------------------------------------------------------------------  Cardiac Enzymes No results for input(s): TROPONINI in the last 168 hours. ------------------------------------------------------------------------------------------------------------------  RADIOLOGY:  Ct Angio Chest  Pe W And/or Wo Contrast  Result Date: 08/21/2018 CLINICAL DATA:  Short of breath rule out pulmonary embolism. Recent left leg amputation. Diabetes. EXAM: CT ANGIOGRAPHY CHEST WITH CONTRAST TECHNIQUE: Multidetector CT imaging of the chest was performed using the standard protocol during bolus administration of intravenous contrast. Multiplanar CT image reconstructions and MIPs were obtained to evaluate the vascular anatomy. CONTRAST:  74mL OMNIPAQUE IOHEXOL 350 MG/ML SOLN COMPARISON:  Chest 08/21/2017 FINDINGS: Cardiovascular: Negative for pulmonary embolism. Pulmonary arteries normal in size. Cardiac enlargement with left ventricular dilatation. Moderate coronary calcification. Atherosclerotic aortic arch without aneurysm or dissection. Mediastinum/Nodes: Negative for mass or adenopathy. Lungs/Pleura: Small bilateral pleural effusions. Mild dependent bibasilar atelectasis. Negative for infiltrate or mass. Upper Abdomen: No acute abnormality. Musculoskeletal: No acute abnormality. Review of the MIP images confirms the above findings. IMPRESSION: Negative for pulmonary embolism. Small bilateral pleural effusions with mild bibasilar atelectasis Left ventricular enlargement.  Coronary calcification. Aortic Atherosclerosis (ICD10-I70.0). Electronically Signed   By: Marlan Palau M.D.   On: 08/21/2018 16:03   Dg Chest Port 1 View  Result Date: 08/21/2018 CLINICAL DATA:  Shortness of breath. EXAM: PORTABLE CHEST 1 VIEW COMPARISON:  Radiograph of July 20, 2018. FINDINGS: The heart size and mediastinal contours are within normal limits. No pneumothorax is noted. Minimal bibasilar subsegmental atelectasis or edema is noted with minimal pleural effusions. The visualized skeletal structures are unremarkable. IMPRESSION: Minimal bibasilar subsegmental atelectasis or edema is noted with minimal pleural effusions. Electronically Signed   By: Lupita Raider, M.D.   On: 08/21/2018 13:40    EKG:  Sinus tachycardia no ST elevation or depression IMPRESSION AND PLAN:   63 year old female with a history of COPD who presents to the emergency room due to shortness of breath.  1.  Acute hypoxic respiratory failure due to acute exacerbation of COPD with acute bronchitis: CT chest shows no evidence of pulmonary emboli which is reassuring. Start Solu-Medrol 40 mg IV every 12 hours Continue nebs and inhalers Doxycycline for acute bronchitis Oxygen is being weaned Incentive spirometer ordered   2.  Uncontrolled diabetes: Continue outpatient regimen Diabetes nurse consult High-dose sliding scale and ADA diet   3.  History of DVT on Xarelto  4. Tobacco dependence: Patient is encouraged to quit smoking. Counseling was provided for 4 minutes. She reports she has not smoked a cigarette in several weeks.  5.  Protein calorie malnutrition: Dietary consult  PT consult for discharge planning  All the records are reviewed and case discussed with ED provider. Management plans discussed with the patient and she is in agreement  CODE STATUS: full  TOTAL TIME TAKING CARE OF THIS PATIENT: 42 minutes.    Gabriell Daigneault M.D on 08/21/2018 at 4:26 PM  Between 7am to 6pm - Pager - [phone]  After 6pm go to [email or web address] - Social research officer, government  [facility]   Office  [phone]  CC: Primary care physician; Mickel Fuchs, MD

## 2018-08-21 NOTE — ED Triage Notes (Signed)
Patient arrived from via EMS complaints of SOB, 80% room air. Patient reports below knee amputation in January. Per EMS CBG 532. Patient on 4L O2 sats 97%.

## 2018-08-21 NOTE — Progress Notes (Signed)
   08/21/18 1750  Clinical Encounter Type  Visited With Patient  Visit Type Initial  Referral From Nurse  Consult/Referral To Chaplain  Spiritual Encounters  Spiritual Needs Prayer;Emotional  CH entered room and noticed several staff members present. CH was asked to come when things got settled down. CH waited outside room for a few minutes until invited to enter room. Contact precaution room.  Patient seemed anxious. Showed discomfort in bed. CH had to repeat self. Patient expressed belief in God. CH provided pastoral care through prayer. Patient declined AD info. Pastoral visit was appreciated.

## 2018-08-21 NOTE — Progress Notes (Signed)
CODE SEPSIS - PHARMACY COMMUNICATION  **Broad Spectrum Antibiotics should be administered within 1 hour of Sepsis diagnosis**  Time Code Sepsis Called/Page Received: 1318  Antibiotics Ordered: cefepime and vancomycin  Time of 1st antibiotic administration: 1518  Additional action taken by pharmacy: d/w Dr Cyril Loosen: treatment initiation required waiting for CXR to guide antibiotic selection. Additionally, there has been difficulty in gaining IV access in this patient.   Lowella Bandy ,PharmD Clinical Pharmacist  08/21/2018  3:08 PM

## 2018-08-21 NOTE — Progress Notes (Signed)
Family Meeting Note  Advance Directive:[id number]}  Meeting took place with patient  diagnosis   acute hypoxic respiratory failure with exacerbation of COPD   protein calorie malnutrition   patient's progosis: Unable to determine and Goals for treatment: DNR  Additional follow-up to be provided: Chaplain consult for advanced directives and patient would benefit from outpatient palliative care services  Time spent with discussion 16 MINUTES  April Rebert, MD

## 2018-08-21 NOTE — ED Provider Notes (Addendum)
[facility] Emergency Department Provider Note   ____________________________________________    I have reviewed the triage vital signs and the nursing notes.   HISTORY  Chief Complaint Respiratory Distress and Hyperglycemia     HPI April [name] is a 64 y.o. female with a history of diabetes, COPD recent left lower leg amputation presents with complaints of shortness of breath and cough.  She does not believe that she has had fevers but she is not sure, does report some chills.  Denies pleurisy or chest pain.  She reports she quit smoking 4 weeks ago.  No recent travel.  Has not taken anything for this.  Past Medical History:  Diagnosis Date  . Anxiety   . Colon polyps   . COPD (chronic obstructive pulmonary disease) (HCC)   . Diabetes mellitus without complication (HCC)   . DVT (deep venous thrombosis) (HCC)   . DVT (deep venous thrombosis) (HCC)   . History of colonic polyps   . Hyperlipidemia   . Peripheral artery disease (HCC)   . Pulmonary nodules/lesions, multiple 08/2012   NEGATIVE PET SCAN  . Retroperitoneal abscess (HCC)    history of in 04/2013  . Tobacco abuse     Patient Active Problem List   Diagnosis Date Noted  . Diabetic foot infection (HCC) 07/09/2018  . Acute on chronic respiratory failure with hypoxemia (HCC) 06/03/2018  . Acute respiratory failure with hypoxia and hypercapnia (HCC)   . Acute osteomyelitis of right foot (HCC) 05/23/2018  . Protein-calorie malnutrition, severe 04/10/2018  . Diabetes (HCC) 04/08/2018  . COPD (chronic obstructive pulmonary disease) (HCC) 04/08/2018  . HLD (hyperlipidemia) 04/08/2018  . Diabetic foot ulcer (HCC) 04/08/2018  . 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 Elevated CEA 01/30/2014    Past Surgical History:  Procedure Laterality Date  . AMPUTATION Left 07/24/2018   Procedure: AMPUTATION BELOW KNEE;  Surgeon: Annice Needy, MD;   Location: [facility] ORS;  Service: Vascular;  Laterality: Left;  . APPLICATION OF WOUND VAC Right 07/11/2018   Procedure: APPLICATION OF WOUND VAC;  Surgeon: Gwyneth Revels, DPM;  Location: [facility] ORS;  Service: Podiatry;  Laterality: Right;  . CHOLECYSTECTOMY    . COLONOSCOPY  05/2007  . ENDARTERECTOMY FEMORAL Bilateral 07/17/2018   Procedure: ENDARTERECTOMY FEMORAL - BILATERAL SFA STENT PLACEMENT - LEFT;  Surgeon: Annice Needy, MD;  Location: [facility] ORS;  Service: Vascular;  Laterality: Bilateral;  . IRRIGATION AND DEBRIDEMENT FOOT Bilateral 05/25/2018   Procedure: IRRIGATION AND DEBRIDEMENT FOOT application wound vac left  foot;  Surgeon: Recardo Evangelist, DPM;  Location: [facility] ORS;  Service: Podiatry;  Laterality: Bilateral;  . IRRIGATION AND DEBRIDEMENT FOOT Right 07/11/2018   Procedure: FOOT DEBRIDEMENT;  Surgeon: Gwyneth Revels, DPM;  Location: [facility] ORS;  Service: Podiatry;  Laterality: Right;  . LOWER EXTREMITY ANGIOGRAPHY Left 05/26/2018   Procedure: Lower Extremity Angiography with possible intervention;  Surgeon: Annice Needy, MD;  Location: [facility] INVASIVE CV LAB;  Service: Cardiovascular;  Laterality: Left;  . LOWER EXTREMITY ANGIOGRAPHY Right 05/27/2018   Procedure: Lower Extremity Angiography;  Surgeon: Renford Dills, MD;  Location: [facility] INVASIVE CV LAB;  Service: Cardiovascular;  Laterality: Right;  . LOWER EXTREMITY ANGIOGRAPHY Right 07/15/2018   Procedure: Lower Extremity Angiography;  Surgeon: Renford Dills, MD;  Location: [facility] INVASIVE CV LAB;  Service: Cardiovascular;  Laterality: Right;    Prior to Admission medications   Medication Sig Start Date End Date Taking? Authorizing  Provider  albuterol (PROVENTIL HFA;VENTOLIN HFA) 108 (90 Base) MCG/ACT inhaler Inhale 2 puffs into the lungs every 6 (six) hours as needed for wheezing or shortness of breath.    [provider]  albuterol (PROVENTIL) (2.5 MG/3ML) 0.083% nebulizer solution Inhale 2.5 mg into the lungs every 6 (six)  hours as needed for wheezing or shortness of breath.     [provider]  ALPRAZolam Prudy Feeler(XANAX) 0.5 MG tablet Take 1 tablet (0.5 mg total) by mouth 2 (two) times daily as needed for anxiety. 07/26/18   Adrian SaranMody, Sital, MD  aspirin 81 MG chewable tablet Chew 81 mg by mouth daily. 05/30/17   [provider]  atorvastatin (LIPITOR) 40 MG tablet Take 40 mg by mouth daily. 10/24/17 10/24/18  [provider]  cephALEXin (KEFLEX) 500 MG capsule Take 1 capsule (500 mg total) by mouth 3 (three) times daily. 08/14/18   Georgiana SpinnerBrown, Fallon E, NP  ferrous sulfate 325 (65 FE) MG tablet Take 1 tablet (325 mg total) by mouth 2 (two) times daily with a meal. 06/01/18 07/01/18  Pyreddy, Vivien RotaPavan, MD  Fluticasone-Salmeterol (ADVAIR DISKUS) 250-50 MCG/DOSE AEPB Inhale 1 puff into the lungs 2 (two) times daily. 03/14/15 01/16/26  Beers, Charmayne Sheerharles M, PA-C  furosemide (LASIX) 40 MG tablet Take 1 tablet (40 mg total) by mouth daily. 06/14/18   Altamese DillingVachhani, Vaibhavkumar, MD  gabapentin (NEURONTIN) 100 MG capsule Take 1 capsule (100 mg total) by mouth at bedtime. 07/26/18   Adrian SaranMody, Sital, MD  guaiFENesin-dextromethorphan (ROBITUSSIN DM) 100-10 MG/5ML syrup Take 5 mLs by mouth every 4 (four) hours as needed for cough. 04/11/18   Milagros LollSudini, Srikar, MD  HYDROcodone-acetaminophen (NORCO) 5-325 MG tablet Take 1 tablet by mouth every 6 (six) hours as needed for up to 7 days for moderate pain. 08/14/18 08/21/18  Georgiana SpinnerBrown, Fallon E, NP  HYDROcodone-acetaminophen (NORCO/VICODIN) 5-325 MG tablet Take 1-2 tablets by mouth every 6 (six) hours as needed for moderate pain or severe pain. 07/26/18   Adrian SaranMody, Sital, MD  insulin aspart (NOVOLOG) 100 UNIT/ML injection Inject 3 Units into the skin 3 (three) times daily with meals. 07/26/18   Adrian SaranMody, Sital, MD  insulin glargine (LANTUS) 100 UNIT/ML injection Inject 0.15 mLs (15 Units total) into the skin at bedtime. 07/26/18   Adrian SaranMody, Sital, MD  metFORMIN (GLUCOPHAGE) 500 MG tablet  06/13/18   [provider]   midodrine (PROAMATINE) 10 MG tablet Take 1 tablet (10 mg total) by mouth 3 (three) times daily with meals. 07/26/18   Adrian SaranMody, Sital, MD  nicotine (NICODERM CQ - DOSED IN MG/24 HOURS) 14 mg/24hr patch Place 1 patch (14 mg total) onto the skin daily as needed (nicotine withdrawal). 07/26/18   Adrian SaranMody, Sital, MD  pantoprazole (PROTONIX) 40 MG tablet Take 40 mg by mouth 2 (two) times daily.  04/25/18   [provider]  polyethylene glycol (MIRALAX / GLYCOLAX) packet Take 17 g by mouth daily. 06/14/18   Altamese DillingVachhani, Vaibhavkumar, MD  potassium chloride (K-DUR,KLOR-CON) 10 MEQ tablet Take by mouth. 06/13/18   [provider]  protein supplement shake (PREMIER PROTEIN) LIQD Take 325 mLs (11 oz total) by mouth 2 (two) times daily between meals. 06/14/18   Altamese DillingVachhani, Vaibhavkumar, MD  rivaroxaban (XARELTO) 10 MG TABS tablet Take 10 mg by mouth every evening.    [provider]  SPIRIVA HANDIHALER 18 MCG inhalation capsule Place 18 mcg into inhaler and inhale daily.  05/19/18   [provider]  tamsulosin (FLOMAX) 0.4 MG CAPS capsule Take 1 capsule (0.4 mg  total) by mouth daily. 06/14/18   Altamese DillingVachhani, Vaibhavkumar, MD  vitamin C (VITAMIN C) 500 MG tablet Take 1 tablet (500 mg total) by mouth 2 (two) times daily. 06/13/18   Altamese DillingVachhani, Vaibhavkumar, MD     Allergies Eggs or egg-derived products  Family History  Problem Relation Age of Onset  . Brain cancer Mother     Social History Social History   Tobacco Use  . Smoking status: Current Every Day Smoker    Packs/day: 1.50    Years: 47.00    Pack years: 70.50    Types: Cigarettes  . Smokeless tobacco: Never Used  . Tobacco comment: Smoking History 2.5-started at age 63  Substance Use Topics  . Alcohol use: No    Alcohol/week: 0.0 standard drinks  . Drug use: No    Review of Systems  Constitutional: As above Eyes: No visual changes.  ENT: No sore throat. Cardiovascular: As above Respiratory: As  above Gastrointestinal: No abdominal pain.  No nausea, no vomiting.   Genitourinary: Negative for dysuria. Musculoskeletal: Left stump pain Skin: Negative for rash. Neurological: Negative for headaches or weakness   ____________________________________________   PHYSICAL EXAM:  VITAL SIGNS: ED Triage Vitals  Enc Vitals Group     BP 08/21/18 1314 135/73     Pulse Rate 08/21/18 1314 (!) 129     Resp 08/21/18 1314 (!) 27     Temp 08/21/18 1314 98 F (36.7 C)     Temp Source 08/21/18 1314 Oral     SpO2 08/21/18 1309 (!) 80 %     Weight 08/21/18 1316 38.1 kg (84 lb)     Height 08/21/18 1316 1.575 m (5\' 2" )     Head Circumference --      Peak Flow --      Pain Score 08/21/18 1315 9     Pain Loc --      Pain Edu? --      Excl. in GC? --     Constitutional: Alert and oriented.  Eyes: Conjunctivae are normal.   Nose: No congestion/rhinnorhea. Mouth/Throat: Mucous membranes are moist.    Cardiovascular: Tachycardia, regular rhythm. Grossly normal heart sounds.  Good peripheral circulation. Respiratory: Increased respiratory effort with tachypnea no retractions.  Diffuse wheezing Gastrointestinal: Soft and nontender. No distention.    Musculoskeletal: Left stump, sutures in place well-appearing, no erythema.  Warm and well perfused Neurologic:  Normal speech and language. No gross focal neurologic deficits are appreciated.  Skin:  Skin is warm, dry and intact.  Psychiatric: Mood and affect are normal. Speech and behavior are normal.  ____________________________________________   LABS (all labs ordered are listed, but only abnormal results are displayed)  Labs Reviewed  LACTIC ACID, PLASMA - Abnormal; Notable for the following components:      Result Value   Lactic Acid, Venous 3.4 (*)    All other components within normal limits  COMPREHENSIVE METABOLIC PANEL - Abnormal; Notable for the following components:   Sodium 134 (*)    Glucose, Bld 522 (*)    BUN 31 (*)     Calcium 8.6 (*)    Albumin 3.0 (*)    AST 50 (*)    ALT 49 (*)    Alkaline Phosphatase 198 (*)    All other components within normal limits  CBC WITH DIFFERENTIAL/PLATELET - Abnormal; Notable for the following components:   RBC 3.11 (*)    Hemoglobin 9.2 (*)    HCT 31.2 (*)    MCV 100.3 (*)  MCHC 29.5 (*)    RDW 17.6 (*)    Neutro Abs 8.5 (*)    All other components within normal limits  URINALYSIS, COMPLETE (UACMP) WITH MICROSCOPIC - Abnormal; Notable for the following components:   Color, Urine YELLOW (*)    APPearance CLOUDY (*)    Glucose, UA >=500 (*)    Hgb urine dipstick MODERATE (*)    Protein, ur 30 (*)    Leukocytes,Ua LARGE (*)    WBC, UA >50 (*)    All other components within normal limits  GLUCOSE, CAPILLARY - Abnormal; Notable for the following components:   Glucose-Capillary 460 (*)    All other components within normal limits  CULTURE, BLOOD (ROUTINE X 2)  URINE CULTURE  CULTURE, BLOOD (SINGLE)  INFLUENZA PANEL BY PCR (TYPE A & B)  LACTIC ACID, PLASMA   ____________________________________________  EKG  ED ECG REPORT I, Jene Every, the attending physician, personally viewed and interpreted this ECG.  Date: 08/21/2018  Rhythm: Tachycardia QRS Axis: normal Intervals: normal ST/T Wave abnormalities: Nonspecific changes Narrative Interpretation: no evidence of acute ischemia  ____________________________________________  RADIOLOGY  Chest x-ray negative for pneumonia ____________________________________________   PROCEDURES  Procedure(s) performed: No  Procedures   Critical Care performed: yes  CRITICAL CARE Performed by: Jene Every   Total critical care time:35 minutes  Critical care time was exclusive of separately billable procedures and treating other patients.  Critical care was necessary to treat or prevent imminent or life-threatening deterioration.  Critical care was time spent personally by me on the following  activities: development of treatment plan with patient and/or surrogate as well as nursing, discussions with consultants, evaluation of patient's response to treatment, examination of patient, obtaining history from patient or surrogate, ordering and performing treatments and interventions, ordering and review of laboratory studies, ordering and review of radiographic studies, pulse oximetry and re-evaluation of patient's condition.  ____________________________________________   INITIAL IMPRESSION / ASSESSMENT AND PLAN / ED COURSE  Pertinent labs & imaging results that were available during my care of the patient were reviewed by me and considered in my medical decision making (see chart for details).  Patient presents with significant shortness of breath, suspect this is related to COPD given wheezing on exam.  Lab work is significant for elevated lactic acid although no clear pneumonia on chest x-ray, covered with broad-spectrum antibiotics.  Also with elevated glucose but normal anion gap.  Initial room air saturation was 80%, this improved with treatment.  Discussed with the hospitalist for admission  Given history of DVT I will order a CT angiography be followed up by internal medicine    ____________________________________________   FINAL CLINICAL IMPRESSION(S) / ED DIAGNOSES  Final diagnoses:  Respiratory distress  COPD exacerbation (HCC)        Note:  This document was prepared using Dragon voice recognition software and may include unintentional dictation errors.   Jene Every, MD 08/21/18 1526    Jene Every, MD 08/30/18 [phone]

## 2018-08-21 NOTE — ED Notes (Signed)
Date and time results received: 08/21/18 1410 (use smartphrase ".now" to insert current time)  Test: Glucose Critical Value: 522  Name of Provider Notified: Dr. Cyril Loosen  Orders Received? Or Actions Taken?: No new orders at this time.

## 2018-08-22 DIAGNOSIS — L8990 Pressure ulcer of unspecified site, unspecified stage: Secondary | ICD-10-CM

## 2018-08-22 LAB — URINE CULTURE: Culture: NO GROWTH

## 2018-08-22 LAB — BASIC METABOLIC PANEL
Anion gap: 8 (ref 5–15)
BUN: 35 mg/dL — ABNORMAL HIGH (ref 8–23)
CHLORIDE: 102 mmol/L (ref 98–111)
CO2: 26 mmol/L (ref 22–32)
Calcium: 8 mg/dL — ABNORMAL LOW (ref 8.9–10.3)
Creatinine, Ser: 1.03 mg/dL — ABNORMAL HIGH (ref 0.44–1.00)
GFR calc Af Amer: >60 mL/min (ref >60)
GFR calc non Af Amer: 58 mL/min — ABNORMAL LOW (ref >60)
Glucose, Bld: 421 mg/dL — ABNORMAL HIGH (ref 70–99)
Potassium: 4 mmol/L (ref 3.5–5.1)
Sodium: 136 mmol/L (ref 135–145)

## 2018-08-22 LAB — GLUCOSE, CAPILLARY
Glucose-Capillary: 445 mg/dL — ABNORMAL HIGH (ref 70–99)
Glucose-Capillary: 371 mg/dL — ABNORMAL HIGH (ref 70–99)
Glucose-Capillary: 341 mg/dL — ABNORMAL HIGH (ref 70–99)
Glucose-Capillary: 327 mg/dL — ABNORMAL HIGH (ref 70–99)
Glucose-Capillary: 152 mg/dL — ABNORMAL HIGH (ref 70–99)
Glucose-Capillary: 195 mg/dL — ABNORMAL HIGH (ref 70–99)

## 2018-08-22 LAB — CBC
HCT: 27.2 % — ABNORMAL LOW (ref 36.0–46.0)
Hemoglobin: 8.3 g/dL — ABNORMAL LOW (ref 12.0–15.0)
MCH: 29.5 pg (ref 26.0–34.0)
MCHC: 30.5 g/dL (ref 30.0–36.0)
MCV: 96.8 fL (ref 80.0–100.0)
PLATELETS: 209 10*3/uL (ref 150–400)
RBC: 2.81 MIL/uL — ABNORMAL LOW (ref 3.87–5.11)
RDW: 17.2 % — ABNORMAL HIGH (ref 11.5–15.5)
WBC: 7.6 10*3/uL (ref 4.0–10.5)
nRBC: 0 % (ref 0.0–0.2)

## 2018-08-22 LAB — MRSA PCR SCREENING: MRSA by PCR: POSITIVE — AB

## 2018-08-22 MED ORDER — IPRATROPIUM-ALBUTEROL 0.5-2.5 (3) MG/3ML IN SOLN
3.0000 mL | Freq: Four times a day (QID) | RESPIRATORY_TRACT | Status: DC
  Administered 2018-08-23 (×4): 3 mL via RESPIRATORY_TRACT
  Filled 2018-08-22 (×4): qty 3

## 2018-08-22 MED ORDER — INSULIN GLARGINE 100 UNIT/ML ~~LOC~~ SOLN
30.0000 [IU] | Freq: Every day | SUBCUTANEOUS | Status: DC
  Administered 2018-08-22 – 2018-08-24 (×2): 30 [IU] via SUBCUTANEOUS
  Filled 2018-08-22 (×4): qty 0.3

## 2018-08-22 MED ORDER — INSULIN REGULAR HUMAN 100 UNIT/ML IJ SOLN
20.0000 [IU] | Freq: Once | INTRAMUSCULAR | Status: AC
  Administered 2018-08-22: 20 [IU] via INTRAVENOUS
  Filled 2018-08-22: qty 10

## 2018-08-22 MED ORDER — INSULIN GLARGINE 100 UNIT/ML ~~LOC~~ SOLN
20.0000 [IU] | Freq: Once | SUBCUTANEOUS | Status: AC
  Administered 2018-08-22: 20 [IU] via SUBCUTANEOUS
  Filled 2018-08-22: qty 0.2

## 2018-08-22 MED ORDER — LEVOFLOXACIN 750 MG PO TABS
750.0000 mg | ORAL_TABLET | ORAL | Status: DC
  Administered 2018-08-23: 750 mg via ORAL
  Filled 2018-08-22: qty 1

## 2018-08-22 MED ORDER — SODIUM CHLORIDE 0.9 % IV BOLUS
500.0000 mL | Freq: Once | INTRAVENOUS | Status: AC
  Administered 2018-08-22: 13:00:00 via INTRAVENOUS

## 2018-08-22 MED ORDER — INSULIN ASPART 100 UNIT/ML ~~LOC~~ SOLN
20.0000 [IU] | Freq: Once | SUBCUTANEOUS | Status: AC
  Administered 2018-08-22: 20 [IU] via SUBCUTANEOUS
  Filled 2018-08-22: qty 1

## 2018-08-22 MED ORDER — COLLAGENASE 250 UNIT/GM EX OINT
TOPICAL_OINTMENT | Freq: Every day | CUTANEOUS | Status: DC
  Administered 2018-08-22 – 2018-08-25 (×4): via TOPICAL
  Filled 2018-08-22: qty 30

## 2018-08-22 MED ORDER — OCUVITE-LUTEIN PO CAPS
1.0000 | ORAL_CAPSULE | Freq: Every day | ORAL | Status: DC
  Administered 2018-08-22 – 2018-08-25 (×4): 1 via ORAL
  Filled 2018-08-22 (×4): qty 1

## 2018-08-22 MED ORDER — POTASSIUM CHLORIDE CRYS ER 10 MEQ PO TBCR
10.0000 meq | EXTENDED_RELEASE_TABLET | Freq: Every day | ORAL | Status: DC
  Administered 2018-08-22 – 2018-08-25 (×4): 10 meq via ORAL
  Filled 2018-08-22 (×4): qty 1

## 2018-08-22 MED ORDER — ENSURE MAX PROTEIN PO LIQD
11.0000 [oz_av] | Freq: Two times a day (BID) | ORAL | Status: DC
  Administered 2018-08-22 – 2018-08-25 (×5): 11 [oz_av] via ORAL

## 2018-08-22 MED ORDER — VITAMIN C 500 MG PO TABS
500.0000 mg | ORAL_TABLET | Freq: Two times a day (BID) | ORAL | Status: DC
  Administered 2018-08-22 – 2018-08-25 (×7): 500 mg via ORAL
  Filled 2018-08-22 (×7): qty 1

## 2018-08-22 MED ORDER — LEVOFLOXACIN 500 MG PO TABS
500.0000 mg | ORAL_TABLET | Freq: Every day | ORAL | Status: DC
  Administered 2018-08-22: 500 mg via ORAL
  Filled 2018-08-22: qty 1

## 2018-08-22 NOTE — Progress Notes (Signed)
Initial Nutrition Assessment  DOCUMENTATION CODES:   Severe malnutrition in context of chronic illness  INTERVENTION:   Ensure Max protein supplement BID, each supplement provides 150kcal and 30g of protein.  Ocuvite daily for wound healing (provides zinc, vitamin A, vitamin C, Vitamin E, copper, and selenium)  Vitamin C 500mg  po BID  May need to consider carbohydrate controlled diet as appetite improves   NUTRITION DIAGNOSIS:   Severe Malnutrition related to chronic illness(COPD, uncontrolled DM ) as evidenced by severe fat depletion, severe muscle depletion.  GOAL:   Patient will meet greater than or equal to 90% of their needs  MONITOR:   PO intake, Supplement acceptance, Labs, Weight trends, Skin, I & O's  REASON FOR ASSESSMENT:   Consult Assessment of nutrition requirement/status  ASSESSMENT:   63 y.o. female with a history of diabetes, COPD recent left lower leg amputation presents with complaints of shortness of breath and cough.    Pt is well known to nutrition department and this RD from multiple previous admits. Pt with fair appetite and oral intake at baseline; pt generally eats well, small meals. Pt does enjoy drinking chocolate Premier Protein and she drinks these at home, but not regularly. Per chart, pt with 21lb(23%) wt loss from January 2019-October 2019 which is significant. Pt had regained ~10lbs when she was admitted here in November and December 2019. Per chart, pt appears to have lost ~10lbs(11%) since her last admit in January. RD will add supplements and vitamins to support wound healing. Will have to order Ensure Max as [facility] no longer carries Premier Protein. Recommend liberal diet for now, may need to consider carb modified diet once oral intake improves.   Medications reviewed and include: aspirin, ferrous sulfate, lasix, insulin, solu-medrol, protonix  Labs reviewed: BUN 35(H), creat 1.03(H) Hgb 8.3(L), Hct 27.2(L) cbgs- 445, 371, 341 x 24  hrs AIC 7.9(H)  NUTRITION - FOCUSED PHYSICAL EXAM:    Most Recent Value  Orbital Region  Moderate depletion  Upper Arm Region  Severe depletion  Thoracic and Lumbar Region  Severe depletion  Buccal Region  Moderate depletion  Temple Region  Moderate depletion  Clavicle Bone Region  Severe depletion  Clavicle and Acromion Bone Region  Severe depletion  Scapular Bone Region  Severe depletion  Dorsal Hand  Severe depletion  Patellar Region  Severe depletion  Anterior Thigh Region  Severe depletion  Posterior Calf Region  Severe depletion  Edema (RD Assessment)  None  Hair  Reviewed  Eyes  Reviewed  Mouth  Reviewed  Skin  Reviewed  Nails  Reviewed     Diet Order:   Diet Order            Diet regular Room service appropriate? Yes; Fluid consistency: Thin  Diet effective now             EDUCATION NEEDS:   Education needs have been addressed  Skin:  Skin Assessment: Reviewed RN Assessment(Stage III Sacrum, Stage II R foot, s/p recent L BKA)  Last BM:  2/21- type 6  Height:   Ht Readings from Last 1 Encounters:  08/21/18 5\' 2"  (1.575 m)    Weight:   Wt Readings from Last 1 Encounters:  08/21/18 41.3 kg    Ideal Body Weight:  47 kg(adjusted for BKA)  BMI:  Body mass index is 16.65 kg/m.  Estimated Nutritional Needs:   Kcal:  1200-1400kcal/day   Protein:  62-70g/day   Fluid:  1.2L/day   Betsey Holiday MS, RD, LDN  Pager #- [phone] Office#- [phone] After Hours Pager: [phone]

## 2018-08-22 NOTE — Progress Notes (Signed)
Inpatient Diabetes Program Recommendations  AACE/ADA: New Consensus Statement on Inpatient Glycemic Control (2015)  Target Ranges:  Prepandial:   less than 140 mg/dL      Peak postprandial:   less than 180 mg/dL (1-2 hours)      Critically ill patients:  140 - 180 mg/dL   Results for April [name], April [name] (MRN [id number]) as of 08/22/2018 07:59  Ref. Range 08/21/2018 13:23 08/21/2018 17:18 08/21/2018 22:20  Glucose-Capillary Latest Ref Range: 70 - 99 mg/dL 300 (H) 762 (H) 263 (HH)  10 units REGULAR +  15 units LANTUS given at 11:49pm   Results for April [name], April [name] (MRN [id number]) as of 08/22/2018 07:59  Ref. Range 08/22/2018 01:07 08/22/2018 03:26 08/22/2018 07:43  Glucose-Capillary Latest Ref Range: 70 - 99 mg/dL 389 (H)  20 units REGULAR given at 2:29am 371 (H)  20 units NOVOLOG given at 4:42am 341 (H)  5 units NOVOLOG +  20 units LANTUS   Results for April [name], April [name] (MRN [id number]) as of 08/22/2018 07:59  Ref. Range 04/09/2018 00:52 05/25/2018 12:53 08/21/2018 16:51  Hemoglobin A1C Latest Ref Range: 4.8 - 5.6 % 10.3 (H) 10.9 (H) 7.9 (H)    Admit with: Acute hypoxic respiratory failure due to acute exacerbation of COPD with acute bronchitis  History: DM, COPD  Home DM Meds: Lantus 15 units QHS       Novolog 3 units TID with meals       Metformin (Not sure if patient taking Metformin?)  Current Orders: Lantus 30 units QHS      Novolog Resistant Correction Scale/ SSI (0-20 units) TID AC      Novolog 5 units TID with meals     Getting Solumedrol 40 mg BID.  Lantus dose increased to 30 units QHS for tonight.  Novolog SSI and Novolog Meal Coverage both started this AM.  Will follow and assist daily.    --Will follow patient during hospitalization--  Ambrose Finland RN, MSN, CDE Diabetes Coordinator Inpatient Glycemic Control Team Team Pager: [phone] (8a-5p)

## 2018-08-22 NOTE — Progress Notes (Addendum)
[facility] [place] [facility]   PATIENT NAME: April [name]    MR#:  [id number]  DATE OF BIRTH:  [birth year]  SUBJECTIVE:  Patient feels slightly better, clinically dehydrated, significant other at the bedside, case discussed with interdisciplinary round team, noted uncontrolled blood sugars-we will increase Lantus, wound care to see regarding stage III sacral wound, follow-up on urine cultures  REVIEW OF SYSTEMS:  CONSTITUTIONAL: No fever, fatigue or weakness.  EYES: No blurred or double vision.  EARS, NOSE, AND THROAT: No tinnitus or ear pain.  RESPIRATORY: No cough, shortness of breath, wheezing or hemoptysis.  CARDIOVASCULAR: No chest pain, orthopnea, edema.  GASTROINTESTINAL: No nausea, vomiting, diarrhea or abdominal pain.  GENITOURINARY: No dysuria, hematuria.  ENDOCRINE: No polyuria, nocturia,  HEMATOLOGY: No anemia, easy bruising or bleeding SKIN: No rash or lesion. MUSCULOSKELETAL: No joint pain or arthritis.   NEUROLOGIC: No tingling, numbness, weakness.  PSYCHIATRY: No anxiety or depression.   ROS  DRUG ALLERGIES:   Allergies  Allergen Reactions  . Eggs Or Egg-Derived Products Diarrhea, Itching and Nausea And Vomiting    VITALS:  Blood pressure (!) 111/54, pulse 96, temperature (!) 97.5 F (36.4 C), temperature source Oral, resp. rate 20, height 5\' 2"  (1.575 m), weight 41.3 kg, SpO2 95 %.  PHYSICAL EXAMINATION:  GENERAL:  63 y.o.-year-old patient lying in the bed with no acute distress.  EYES: Pupils equal, round, reactive to light and accommodation. No scleral icterus. Extraocular muscles intact.  HEENT: Head atraumatic, normocephalic. Oropharynx and nasopharynx clear.  NECK:  Supple, no jugular venous distention. No thyroid enlargement, no tenderness.  LUNGS: Normal breath sounds bilaterally, no wheezing, rales,rhonchi or crepitation. No use of accessory muscles of respiration.  CARDIOVASCULAR: S1, S2 normal. No murmurs, rubs, or  gallops.  ABDOMEN: Soft, nontender, nondistended. Bowel sounds present. No organomegaly or mass.  EXTREMITIES: No pedal edema, cyanosis, or clubbing.  NEUROLOGIC: Cranial nerves II through XII are intact. Muscle strength 5/5 in all extremities. Sensation intact. Gait not checked.  PSYCHIATRIC: The patient is alert and oriented x 3.  SKIN: No obvious rash, lesion, or ulcer.   Physical Exam LABORATORY PANEL:   CBC Recent Labs  Lab 08/22/18 0505  WBC 7.6  HGB 8.3*  HCT 27.2*  PLT 209   ------------------------------------------------------------------------------------------------------------------  Chemistries  Recent Labs  Lab 08/21/18 1313 08/22/18 0505  NA 134* 136  K 4.3 4.0  CL 98 102  CO2 22 26  GLUCOSE 522* 421*  BUN 31* 35*  CREATININE 0.94 1.03*  CALCIUM 8.6* 8.0*  AST 50*  --   ALT 49*  --   ALKPHOS 198*  --   BILITOT 0.9  --    ------------------------------------------------------------------------------------------------------------------  Cardiac Enzymes No results for input(s): TROPONINI in the last 168 hours. ------------------------------------------------------------------------------------------------------------------  RADIOLOGY:  Ct Angio Chest Pe W And/or Wo Contrast  Result Date: 08/21/2018 CLINICAL DATA:  Short of breath rule out pulmonary embolism. Recent left leg amputation. Diabetes. EXAM: CT ANGIOGRAPHY CHEST WITH CONTRAST TECHNIQUE: Multidetector CT imaging of the chest was performed using the standard protocol during bolus administration of intravenous contrast. Multiplanar CT image reconstructions and MIPs were obtained to evaluate the vascular anatomy. CONTRAST:  67mL OMNIPAQUE IOHEXOL 350 MG/ML SOLN COMPARISON:  Chest 08/21/2017 FINDINGS: Cardiovascular: Negative for pulmonary embolism. Pulmonary arteries normal in size. Cardiac enlargement with left ventricular dilatation. Moderate coronary calcification. Atherosclerotic aortic arch  without aneurysm or dissection. Mediastinum/Nodes: Negative for mass or adenopathy. Lungs/Pleura: Small bilateral pleural effusions. Mild dependent bibasilar atelectasis. Negative  for infiltrate or mass. Upper Abdomen: No acute abnormality. Musculoskeletal: No acute abnormality. Review of the MIP images confirms the above findings. IMPRESSION: Negative for pulmonary embolism. Small bilateral pleural effusions with mild bibasilar atelectasis Left ventricular enlargement.  Coronary calcification. Aortic Atherosclerosis (ICD10-I70.0). Electronically Signed   By: Marlan Palau M.D.   On: 08/21/2018 16:03   Dg Chest Port 1 View  Result Date: 08/21/2018 CLINICAL DATA:  Shortness of breath. EXAM: PORTABLE CHEST 1 VIEW COMPARISON:  Radiograph of July 20, 2018. FINDINGS: The heart size and mediastinal contours are within normal limits. No pneumothorax is noted. Minimal bibasilar subsegmental atelectasis or edema is noted with minimal pleural effusions. The visualized skeletal structures are unremarkable. IMPRESSION: Minimal bibasilar subsegmental atelectasis or edema is noted with minimal pleural effusions. Electronically Signed   By: Lupita Raider, M.D.   On: 08/21/2018 13:40    ASSESSMENT AND PLAN:  63 year old female with a history of COPD who presents to the emergency room due to shortness of breath.  *Acute hypoxic respiratory failure  Stable due to acute exacerbation of COPD with acute bronchitis: CT chest shows no evidence of pulmonary emboli  Continue supplemental oxygen with weaning as tolerated  *Acute on COPD exacerbation Stable Continue IV Solu-Medrol with taper as tolerated, aggressive pulmonary toilet with bronchodilator therapy, empiric Levaquin for 5-day course, supplemental oxygen wean as tolerated, mucolytic agents, follow-up on sputum cultures  *Acute possible UTI Empiric Levaquin per above and follow-up on cultures  *Acute hyperglycemia with history of type II uncontrolled  diabetes mellitus Exacerbated by steroid use, COPD exacerbation, and probable UTI Increase Lantus to 30 units at bedtime, Lantus 15 units subcu x1 now, high-dose sliding scale insulin with Accu-Cheks per routine, taper steroids per above, ADA diet Diabetes nurse consulted  *Chronic tobacco smoking abuse/dependency  Nicotine patch ordered, cessation counseling   *Acute on chronic protein calorie malnutrition  Dietary consulted   *Chronic stage III sacral cubitus wound  Wound care nurse consulted  Continue local wound care   Discharge in 1 to 2 days barring any complications   All the records are reviewed and case discussed with Care Management/Social Workerr. Management plans discussed with the patient, family and they are in agreement.  CODE STATUS: dnr  TOTAL TIME TAKING CARE OF THIS PATIENT: 40 minutes.     POSSIBLE D/C IN 1-2 DAYS, DEPENDING ON CLINICAL CONDITION.   Evelena Asa Fidencia Mccloud M.D on 08/22/2018   Between 7am to 6pm - Pager - [phone]  After 6pm go to [email or web address] - password [email or web address]  [facility]  Office  [phone]  CC: Primary care physician; Mickel Fuchs, MD  Note: This dictation was prepared with Dragon dictation along with smaller phrase technology. Any transcriptional errors that result from this process are unintentional.

## 2018-08-22 NOTE — Care Management Note (Signed)
Case Management Note  Patient Details  Name: April [name] MRN: [id number] Date of Birth: 02-02-1956   Patient admitted for Acute hypoxic respiratory failure.  Patient lives at home with significant other, and granddaughter.  Patient with hx of lower leg amputation in January.  Previous discharge patient went to [facility] center.  Patient was discharged back to home on 2/17.    RNCM notified by [place] with [facility] that patient has orders for home health RN, PT, OT.  They were to open the patient 2/22, and apply a wound vac in the home.   WOC consult pending while inpatient.   RNCM confirmed with Jeneen Rinks from [place], that they have and open order and authorization for home wound vac.  Vac to be delivered to hospital room should it be needed at discharge. MD notified. UPDATE: WOC has seen patient and deemed wound vac not appropriate treatment,  MD updated.  RNCM met with patient and granddaughter at bedside.  Significant  Other provides transportation to all appointments. Granddaughter provides day to day care.  Granddaughter states they have RW, shower seat, BSC, electric WC (that doesn't work), and a manuel WC.  Per granddaughter patient was supposed to be getting a hospital be, but they haven't heard from anyone. It was reported that the order for the hospital bed may have been sent to [facility].  RNCM has left message for [facility], awaiting return call.    Per [facility] rep is patient has dx of acute on chronic respiratory failure related to COPD she would be potentially candidate for trilogy.  Patient PCO2 on 05/24/18 60.  MD notified.  Patient will require orders for home health RN, PT, OT, dressing changes, hospital bed, and low air loss mattress. RNCM following for need for home O2 and or trilogy. Per Jeneen Rinks with [facility] they would be able to assist with any DME needs.    Subjective/Objective:                    Action/Plan:   Expected Discharge Date:                   Expected Discharge Plan:     In-House Referral:     Discharge planning Services     Post Acute Care Choice:    Choice offered to:     DME Arranged:    DME Agency:     HH Arranged:    HH Agency:     Status of Service:     If discussed at [employer] of Stay Meetings, dates discussed:    Additional Comments:  Beverly Sessions, RN 08/22/2018, 2:05 PM

## 2018-08-22 NOTE — Consult Note (Signed)
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 Wound type: Surgical, pressure, full thickness Pressure Injury POA: Yes Measurement: 3cm x 1.8cm with depth obscured by the presnce of nonviable tissue Wound bed:100% firmly adherent yellow slough Drainage (amount, consistency, odor) small amount of light yellow exudate Periwound: periwound erythema to 2cm, not warm, not indurated, not fluctuant Dressing procedure/placement/frequency: I will provide a mattress replacement for use here in house.Additionally, a pressure redistribution heel boot for the right foot is ordered. Topical care to the closely approximated left BKA incision will be daily painting with a betadine swabstick, topical care to the right foot will be twice daily NS dressings.  The coccygeal wound requires enzymatic debridement using collagenase (Santyl). Urinary incontinence is a confounding variable: briefs will be abandoned today in favor of a trial with the external powered urinary incontinence device, PurWick. Patient is encouraged to turn from side to side and minimize time in the supine position. She indicates understanding.  Her granddaughter, who appears to be the primary care provider in the home, indicates understanding and reports that she has been performing wound care for many weeks.  WOC nursing team will not follow, but will remain available to this patient, the nursing and medical teams.  Please re-consult if needed. Thanks, Ladona Mow, MSN, RN, GNP, [place]  Pager# [phone]

## 2018-08-22 NOTE — Progress Notes (Signed)
PT Cancellation Note  Patient Details Name: April [name] MRN: [id number] DOB: 1955-08-02   Cancelled Treatment:    Reason Eval/Treat Not Completed: Medical issues which prohibited therapy. Per chart review pt's lactic acid 4.9 and exercise is contraindicated. Will continue to monitor levels acutely and proceed with therapy as they improve.    Emeline Gins, SPT  08/22/2018, 8:58 AM

## 2018-08-23 LAB — GLUCOSE, CAPILLARY
Glucose-Capillary: 368 mg/dL — ABNORMAL HIGH (ref 70–99)
Glucose-Capillary: 373 mg/dL — ABNORMAL HIGH (ref 70–99)
Glucose-Capillary: 155 mg/dL — ABNORMAL HIGH (ref 70–99)
Glucose-Capillary: 106 mg/dL — ABNORMAL HIGH (ref 70–99)

## 2018-08-23 MED ORDER — IPRATROPIUM-ALBUTEROL 0.5-2.5 (3) MG/3ML IN SOLN
3.0000 mL | Freq: Three times a day (TID) | RESPIRATORY_TRACT | Status: DC
  Administered 2018-08-24 – 2018-08-25 (×4): 3 mL via RESPIRATORY_TRACT
  Filled 2018-08-23 (×6): qty 3

## 2018-08-23 MED ORDER — DILTIAZEM HCL 30 MG PO TABS
30.0000 mg | ORAL_TABLET | Freq: Two times a day (BID) | ORAL | Status: DC
  Administered 2018-08-23 – 2018-08-25 (×5): 30 mg via ORAL
  Filled 2018-08-23 (×6): qty 1

## 2018-08-23 MED ORDER — PREDNISONE 50 MG PO TABS
50.0000 mg | ORAL_TABLET | Freq: Every day | ORAL | Status: DC
  Administered 2018-08-24 – 2018-08-25 (×2): 50 mg via ORAL
  Filled 2018-08-23 (×2): qty 1

## 2018-08-23 MED ORDER — OXYCODONE-ACETAMINOPHEN 5-325 MG PO TABS
1.0000 | ORAL_TABLET | Freq: Four times a day (QID) | ORAL | Status: DC | PRN
  Administered 2018-08-23 – 2018-08-25 (×8): 2 via ORAL
  Filled 2018-08-23 (×8): qty 2

## 2018-08-23 NOTE — Progress Notes (Signed)
Pt refused to have dressing changed at this time, pain med given as ordered. Pt stated she wanted to wait

## 2018-08-23 NOTE — Progress Notes (Signed)
PT Cancellation Note  Patient Details Name: April [name] MRN: [id number] DOB: Jan 29, 1956   Cancelled Treatment:    Reason Eval/Treat Not Completed: Patient not medically ready.  Pt's HR noted to be 126-127 bpm at rest on telemetry monitor.  Pt's nurse reports recently giving pt medication for pt's elevated HR and also is planning on performing wound care soon.  Per discussion with pt's nurse, will hold PT at this time and will re-attempt PT evaluation at a later date/time as medically appropriate.  Hendricks Limes, PT 08/23/18, 10:56 AM [phone]

## 2018-08-23 NOTE — Evaluation (Signed)
Physical Therapy Evaluation Patient Details Name: April [name] MRN: [id number] DOB: 10/26/1955 Today's Date: 08/23/2018   History of Present Illness  Pt is a 63 y.o. female presenting to hospital 08/21/18 with SOB and cough.  Pt admitted with acute hypoxic respiratory failure d/t acute COPD exacerbation with acute bronchitis, and acute hyperglycemia.  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  Clinical Impression  Prior to hospital admission, pt was dependent with transfers--gets "carried" d/t not being "very heavy" per caregiver report (pt has recent L LE BKA and R foot wounds and pt reports being NWB'ing B LE's).  Pt reports having 24/7 assist at home between granddaughter and good friend.  Currently pt is SBA semi-supine to/from sit and able to scoot to L (about 10 inches) sitting edge of bed with CGA for safety; increased effort noted to perform activities and SOB noted.  O2 sats 98% or greater on supplemental O2 and HR 106-121 bpm during session.  Pt would benefit from skilled PT to address noted impairments and functional limitations (see below for any additional details).  Upon hospital discharge, recommend pt discharge with 24/7 assist and HHPT.    Follow Up Recommendations Home health PT;Supervision/Assistance - 24 hour    Equipment Recommendations  Wheelchair (measurements PT);Wheelchair cushion (measurements PT);Hospital bed;Other (comment)(hoyer lift)    Recommendations for Other Services       Precautions / Restrictions Precautions Precautions: Fall Precaution Comments: Prevalon boot R LE Restrictions Weight Bearing Restrictions: Yes RLE Weight Bearing: Non weight bearing LLE Weight Bearing: Non weight bearing Other Position/Activity Restrictions: L LE BKA 07/24/18; pt reports NWB R LE d/t R foot wounds      Mobility  Bed Mobility Overal bed mobility: Needs Assistance Bed Mobility: Supine to Sit;Sit to Supine     Supine to  sit: Supervision;HOB elevated Sit to supine: Supervision;HOB elevated   General bed mobility comments: increased effort and time to perform on own  Transfers Overall transfer level: Needs assistance Equipment used: None Transfers: Lateral/Scoot Transfers          Lateral/Scoot Transfers: Min guard General transfer comment: Pt able to scoot to L (sitting on edge of bed) a few times prior to laying back down on own; increased effort to perform noted  Ambulation/Gait             General Gait Details: Deferred (pt dependent transfer at baseline)  Information systems manager Rankin (Stroke Patients Only)       Balance Overall balance assessment: Needs assistance Sitting-balance support: No upper extremity supported;Feet supported Sitting balance-Leahy Scale: Good Sitting balance - Comments: steady sitting reaching within BOS                                     Pertinent Vitals/Pain Pain Assessment: 0-10 Pain Score: 8  Pain Location: L BKA site Pain Descriptors / Indicators: Aching;Sore Pain Intervention(s): Limited activity within patient's tolerance;Monitored during session;Repositioned;Patient requesting pain meds-RN notified    Home Living Family/patient expects to be discharged to:: Private residence Living Arrangements: Children Available Help at Discharge: Friend(s);Family;Available 24 hours/day(Home health therapy had not started since STR discharge) Type of Home: Apartment Home Access: Stairs to enter Entrance Stairs-Rails: Right;Left;Can reach both Entrance Stairs-Number of Steps: 4 Home Layout: One level Home Equipment: Walker - 2 wheels;Cane -  single point;Bedside commode;Wheelchair - manual      Prior Function Level of Independence: Needs assistance   Gait / Transfers Assistance Needed: Pt reports that her friend and granddaughter (who provide 24/7 assist) have been carrying her to transfer to different  surfaces (d/t L BKA and R foot wounds)  ADL's / Homemaking Assistance Needed: Assist for bathing, dressing, meals, ADL's in general.        Hand Dominance        Extremity/Trunk Assessment   Upper Extremity Assessment Upper Extremity Assessment: Generalized weakness    Lower Extremity Assessment Lower Extremity Assessment: Generalized weakness(L BKA 0 degrees knee extension and L knee flexion grossly 45 degrees AROM; able to perform L LE SLR)    Cervical / Trunk Assessment Cervical / Trunk Assessment: Normal  Communication   Communication: HOH  Cognition Arousal/Alertness: Awake/alert Behavior During Therapy: WFL for tasks assessed/performed Overall Cognitive Status: Within Functional Limits for tasks assessed                                        General Comments General comments (skin integrity, edema, etc.): L BKA dressing and R foot dressing intact; no drainage noted.  Nursing cleared pt for participation in physical therapy.  Pt agreeable to PT session.  Pt's granddaughter, female friend, and another visitor present during session.    Exercises     Assessment/Plan    PT Assessment Patient needs continued PT services  PT Problem List Decreased strength;Decreased range of motion;Decreased activity tolerance;Decreased balance;Decreased mobility;Decreased knowledge of use of DME;Cardiopulmonary status limiting activity;Decreased knowledge of precautions;Decreased skin integrity;Pain       PT Treatment Interventions DME instruction;Functional mobility training;Therapeutic activities;Therapeutic exercise;Balance training;Patient/family education    PT Goals (Current goals can be found in the Care Plan section)  Acute Rehab PT Goals Patient Stated Goal: to improve strength PT Goal Formulation: With patient Time For Goal Achievement: 09/06/18 Potential to Achieve Goals: Good    Frequency Min 2X/week   Barriers to discharge        Co-evaluation                AM-PAC PT "6 Clicks" Mobility  Outcome Measure Help needed turning from your back to your side while in a flat bed without using bedrails?: A Little Help needed moving from lying on your back to sitting on the side of a flat bed without using bedrails?: A Little Help needed moving to and from a bed to a chair (including a wheelchair)?: A Lot Help needed standing up from a chair using your arms (e.g., wheelchair or bedside chair)?: Total Help needed to walk in hospital room?: Total Help needed climbing 3-5 steps with a railing? : Total 6 Click Score: 11    End of Session Equipment Utilized During Treatment: Oxygen Activity Tolerance: Patient limited by fatigue Patient left: in bed;with call bell/phone within reach;with bed alarm set;with family/visitor present;Other (comment)(R LE prevalon boot donned; pt positioned turned to L side for pressure relief) Nurse Communication: Mobility status;Precautions;Patient requests pain meds;Weight bearing status PT Visit Diagnosis: Other abnormalities of gait and mobility (R26.89);Muscle weakness (generalized) (M62.81);Pain Pain - Right/Left: Left Pain - part of body: Leg    Time: [id number] PT Time Calculation (min) (ACUTE ONLY): 27 min   Charges:   PT Evaluation $PT Eval Low Complexity: 1 Low         Alfred Eckley, PT 08/23/18,  4:50 PM [phone]

## 2018-08-23 NOTE — Progress Notes (Signed)
[facility] [place] [facility]   PATIENT NAME: April [name]    MR#:  [id number]  DATE OF BIRTH:  Apr 21, 1956  SUBJECTIVE:  Patient currently denies any symptoms however heart rate is elevated    REVIEW OF SYSTEMS:  CONSTITUTIONAL: No fever, fatigue or weakness.  EYES: No blurred or double vision.  EARS, NOSE, AND THROAT: No tinnitus or ear pain.  RESPIRATORY: No cough, shortness of breath, wheezing or hemoptysis.  CARDIOVASCULAR: No chest pain, orthopnea, edema.  GASTROINTESTINAL: No nausea, vomiting, diarrhea or abdominal pain.  GENITOURINARY: No dysuria, hematuria.  ENDOCRINE: No polyuria, nocturia,  HEMATOLOGY: No anemia, easy bruising or bleeding SKIN: No rash or lesion. MUSCULOSKELETAL: No joint pain or arthritis.   NEUROLOGIC: No tingling, numbness, weakness.  PSYCHIATRY: No anxiety or depression.   ROS  DRUG ALLERGIES:   Allergies  Allergen Reactions  . Eggs Or Egg-Derived Products Diarrhea, Itching and Nausea And Vomiting    VITALS:  Blood pressure 118/60, pulse (!) 123, temperature 97.7 F (36.5 C), temperature source Oral, resp. rate 18, height 5\' 2"  (1.575 m), weight 41.3 kg, SpO2 100 %.  PHYSICAL EXAMINATION:  GENERAL:  63 y.o.-year-old patient lying in the bed with no acute distress.  EYES: Pupils equal, round, reactive to light and accommodation. No scleral icterus. Extraocular muscles intact.  HEENT: Head atraumatic, normocephalic. Oropharynx and nasopharynx clear.  NECK:  Supple, no jugular venous distention. No thyroid enlargement, no tenderness.  LUNGS: Normal breath sounds bilaterally, no wheezing, rales,rhonchi or crepitation. No use of accessory muscles of respiration.  CARDIOVASCULAR: S1, S2 tachycardic. No murmurs, rubs, or gallops.  ABDOMEN: Soft, nontender, nondistended. Bowel sounds present. No organomegaly or mass.  EXTREMITIES: No pedal edema, cyanosis, or clubbing.  NEUROLOGIC: Cranial nerves II through XII are intact.  Muscle strength 5/5 in all extremities. Sensation intact. Gait not checked.  PSYCHIATRIC: The patient is alert and oriented x 3.  SKIN: No obvious rash, lesion, or ulcer.   Physical Exam LABORATORY PANEL:   CBC Recent Labs  Lab 08/22/18 0505  WBC 7.6  HGB 8.3*  HCT 27.2*  PLT 209   ------------------------------------------------------------------------------------------------------------------  Chemistries  Recent Labs  Lab 08/21/18 1313 08/22/18 0505  NA 134* 136  K 4.3 4.0  CL 98 102  CO2 22 26  GLUCOSE 522* 421*  BUN 31* 35*  CREATININE 0.94 1.03*  CALCIUM 8.6* 8.0*  AST 50*  --   ALT 49*  --   ALKPHOS 198*  --   BILITOT 0.9  --    ------------------------------------------------------------------------------------------------------------------  Cardiac Enzymes No results for input(s): TROPONINI in the last 168 hours. ------------------------------------------------------------------------------------------------------------------  RADIOLOGY:  Ct Angio Chest Pe W And/or Wo Contrast  Result Date: 08/21/2018 CLINICAL DATA:  Short of breath rule out pulmonary embolism. Recent left leg amputation. Diabetes. EXAM: CT ANGIOGRAPHY CHEST WITH CONTRAST TECHNIQUE: Multidetector CT imaging of the chest was performed using the standard protocol during bolus administration of intravenous contrast. Multiplanar CT image reconstructions and MIPs were obtained to evaluate the vascular anatomy. CONTRAST:  52mL OMNIPAQUE IOHEXOL 350 MG/ML SOLN COMPARISON:  Chest 08/21/2017 FINDINGS: Cardiovascular: Negative for pulmonary embolism. Pulmonary arteries normal in size. Cardiac enlargement with left ventricular dilatation. Moderate coronary calcification. Atherosclerotic aortic arch without aneurysm or dissection. Mediastinum/Nodes: Negative for mass or adenopathy. Lungs/Pleura: Small bilateral pleural effusions. Mild dependent bibasilar atelectasis. Negative for infiltrate or mass. Upper  Abdomen: No acute abnormality. Musculoskeletal: No acute abnormality. Review of the MIP images confirms the above findings. IMPRESSION: Negative for pulmonary  embolism. Small bilateral pleural effusions with mild bibasilar atelectasis Left ventricular enlargement.  Coronary calcification. Aortic Atherosclerosis (ICD10-I70.0). Electronically Signed   By: Marlan Palau M.D.   On: 08/21/2018 16:03   Dg Chest Port 1 View  Result Date: 08/21/2018 CLINICAL DATA:  Shortness of breath. EXAM: PORTABLE CHEST 1 VIEW COMPARISON:  Radiograph of July 20, 2018. FINDINGS: The heart size and mediastinal contours are within normal limits. No pneumothorax is noted. Minimal bibasilar subsegmental atelectasis or edema is noted with minimal pleural effusions. The visualized skeletal structures are unremarkable. IMPRESSION: Minimal bibasilar subsegmental atelectasis or edema is noted with minimal pleural effusions. Electronically Signed   By: Lupita Raider, M.D.   On: 08/21/2018 13:40    ASSESSMENT AND PLAN:  63 year old female with a history of COPD who presents to the emergency room due to shortness of breath.  *Acute hypoxic respiratory failure  Stable due to acute exacerbation of COPD with acute bronchitis: CT chest shows no evidence of pulmonary emboli  Continue supplemental oxygen with weaning as tolerated  *Acute on COPD exacerbation Stable Change to oral prednisone   *Acute cystitis UTI ruled out Discontinue Levaquin   *Acute hyperglycemia with history of type II uncontrolled diabetes mellitus Exacerbated by steroid use, COPD exacerbation, add pre-meal insulin  *Tachycardia we will check a EKG I have started patient on Oral Cardizem  *Chronic tobacco smoking abuse/dependency  Nicotine patch ordered, cessation counseling   *Acute on chronic protein calorie malnutrition  Dietary consulted   *Chronic stage III sacral cubitus wound  Wound care nurse consulted  Continue local wound care    Discharge in 1 to 2 days barring any complications   All the records are reviewed and case discussed with Care Management/Social Workerr. Management plans discussed with the patient, family and they are in agreement.  CODE STATUS: dnr  TOTAL TIME TAKING CARE OF THIS PATIENT:35 minutes.     POSSIBLE D/C IN 1-2 DAYS, DEPENDING ON CLINICAL CONDITION.   Auburn Bilberry M.D on 08/23/2018   Between 7am to 6pm - Pager - [phone]  After 6pm go to [email or web address] - password [email or web address]  [facility]  Office  [phone]  CC: Primary care physician; Mickel Fuchs, MD  Note: This dictation was prepared with Dragon dictation along with smaller phrase technology. Any transcriptional errors that result from this process are unintentional.

## 2018-08-23 NOTE — Progress Notes (Signed)
Inpatient Diabetes Program Recommendations  AACE/ADA: New Consensus Statement on Inpatient Glycemic Control (2015)  Target Ranges:  Prepandial:   less than 140 mg/dL      Peak postprandial:   less than 180 mg/dL (1-2 hours)      Critically ill patients:  140 - 180 mg/dL   Lab Results  Component Value Date   GLUCAP 368 (H) 08/23/2018   HGBA1C 7.9 (H) 08/21/2018    Review of Glycemic Control Results for TROYLYNN, MALOY (MRN [id number]) as of 08/23/2018 08:33  Ref. Range 08/22/2018 11:50 08/22/2018 17:05 08/22/2018 21:42 08/23/2018 08:00  Glucose-Capillary Latest Ref Range: 70 - 99 mg/dL 974 (H) 163 (H) 845 (H) 368 (H)   Admit with: Acute hypoxic respiratory failure due to acute exacerbation of COPD with acute bronchitis  History: DM, COPD  Home DM Meds: Lantus 15 units QHS                             Novolog 3 units TID with meals                             Metformin (Not sure if patient taking Metformin?)  Current Orders: Lantus 30 units QHS                            Novolog Resistant Correction Scale/ SSI (0-20 units) TID AC                            Novolog 5 units TID with meals   Getting Solumedrol 40 mg BID.  Patient received 69 units of short acting insulin in 24 hour periods.  Consider increasing to Lantus 40 units QHS.   Will follow and assist daily.   Thanks, Lujean Rave, MSN, RNC-OB Diabetes Coordinator [phone] (8a-5p)

## 2018-08-24 LAB — BASIC METABOLIC PANEL
Anion gap: 7 (ref 5–15)
BUN: 62 mg/dL — ABNORMAL HIGH (ref 8–23)
CO2: 25 mmol/L (ref 22–32)
Calcium: 8.5 mg/dL — ABNORMAL LOW (ref 8.9–10.3)
Chloride: 106 mmol/L (ref 98–111)
Creatinine, Ser: 1.26 mg/dL — ABNORMAL HIGH (ref 0.44–1.00)
GFR calc Af Amer: 53 mL/min — ABNORMAL LOW (ref >60)
GFR calc non Af Amer: 45 mL/min — ABNORMAL LOW (ref >60)
GLUCOSE: 220 mg/dL — AB (ref 70–99)
Potassium: 4.7 mmol/L (ref 3.5–5.1)
Sodium: 138 mmol/L (ref 135–145)

## 2018-08-24 LAB — CBC
HCT: 25.3 % — ABNORMAL LOW (ref 36.0–46.0)
Hemoglobin: 7.3 g/dL — ABNORMAL LOW (ref 12.0–15.0)
MCH: 29.3 pg (ref 26.0–34.0)
MCHC: 28.9 g/dL — ABNORMAL LOW (ref 30.0–36.0)
MCV: 101.6 fL — ABNORMAL HIGH (ref 80.0–100.0)
Platelets: 200 10*3/uL (ref 150–400)
RBC: 2.49 MIL/uL — ABNORMAL LOW (ref 3.87–5.11)
RDW: 18.1 % — ABNORMAL HIGH (ref 11.5–15.5)
WBC: 8.7 10*3/uL (ref 4.0–10.5)
nRBC: 0.2 % (ref 0.0–0.2)

## 2018-08-24 LAB — VITAMIN B12: Vitamin B-12: 425 pg/mL (ref 180–914)

## 2018-08-24 LAB — PREPARE RBC (CROSSMATCH)

## 2018-08-24 LAB — RETICULOCYTES
Immature Retic Fract: 32.7 % — ABNORMAL HIGH (ref 2.3–15.9)
RBC.: 2.66 MIL/uL — ABNORMAL LOW (ref 3.87–5.11)
Retic Count, Absolute: 87.5 10*3/uL (ref 19.0–186.0)
Retic Ct Pct: 3.3 % — ABNORMAL HIGH (ref 0.4–3.1)

## 2018-08-24 LAB — GLUCOSE, CAPILLARY
Glucose-Capillary: 215 mg/dL — ABNORMAL HIGH (ref 70–99)
Glucose-Capillary: 272 mg/dL — ABNORMAL HIGH (ref 70–99)
Glucose-Capillary: 225 mg/dL — ABNORMAL HIGH (ref 70–99)
Glucose-Capillary: 383 mg/dL — ABNORMAL HIGH (ref 70–99)

## 2018-08-24 LAB — FOLATE: Folate: 35 ng/mL (ref >5.9)

## 2018-08-24 LAB — IRON AND TIBC
Iron: 64 ug/dL (ref 28–170)
SATURATION RATIOS: 23 % (ref 10.4–31.8)
TIBC: 282 ug/dL (ref 250–450)
UIBC: 218 ug/dL

## 2018-08-24 LAB — FERRITIN: Ferritin: 100 ng/mL (ref 11–307)

## 2018-08-24 MED ORDER — SODIUM CHLORIDE 0.9% IV SOLUTION
Freq: Once | INTRAVENOUS | Status: AC
  Administered 2018-08-24: 13:00:00 via INTRAVENOUS

## 2018-08-24 MED ORDER — FUROSEMIDE 10 MG/ML IJ SOLN
20.0000 mg | Freq: Two times a day (BID) | INTRAMUSCULAR | Status: DC
  Administered 2018-08-24 – 2018-08-25 (×3): 20 mg via INTRAVENOUS
  Filled 2018-08-24 (×3): qty 4

## 2018-08-24 NOTE — Progress Notes (Signed)
[facility] - [place] [facility]   PATIENT NAME: April [name]    MR#:  [id number]  DATE OF BIRTH:  [birth year]  SUBJECTIVE:  Patient's hemoglobin has dropped.  She is still weak REVIEW OF SYSTEMS:  CONSTITUTIONAL: No fever, fatigue or weakness.  EYES: No blurred or double vision.  EARS, NOSE, AND THROAT: No tinnitus or ear pain.  RESPIRATORY: No cough, shortness of breath, wheezing or hemoptysis.  CARDIOVASCULAR: No chest pain, orthopnea, edema.  GASTROINTESTINAL: No nausea, vomiting, diarrhea or abdominal pain.  GENITOURINARY: No dysuria, hematuria.  ENDOCRINE: No polyuria, nocturia,  HEMATOLOGY: No anemia, easy bruising or bleeding SKIN: No rash or lesion. MUSCULOSKELETAL: No joint pain or arthritis.   NEUROLOGIC: No tingling, numbness, weakness.  PSYCHIATRY: No anxiety or depression.   ROS  DRUG ALLERGIES:   Allergies  Allergen Reactions  . Eggs Or Egg-Derived Products Diarrhea, Itching and Nausea And Vomiting    VITALS:  Blood pressure (!) 110/57, pulse 97, temperature (!) 97.5 F (36.4 C), temperature source Oral, resp. rate 17, height 5\' 2"  (1.575 m), weight 41.3 kg, SpO2 98 %.  PHYSICAL EXAMINATION:  GENERAL:  63 y.o.-year-old patient lying in the bed with no acute distress.  EYES: Pupils equal, round, reactive to light and accommodation. No scleral icterus. Extraocular muscles intact.  HEENT: Head atraumatic, normocephalic. Oropharynx and nasopharynx clear.  NECK:  Supple, no jugular venous distention. No thyroid enlargement, no tenderness.  LUNGS: Normal breath sounds bilaterally, no wheezing, rales,rhonchi or crepitation. No use of accessory muscles of respiration.  CARDIOVASCULAR: S1, S2 tachycardic. No murmurs, rubs, or gallops.  ABDOMEN: Soft, nontender, nondistended. Bowel sounds present. No organomegaly or mass.  EXTREMITIES: No pedal edema, cyanosis, or clubbing.  NEUROLOGIC: Cranial nerves II through XII are intact. Muscle strength  5/5 in all extremities. Sensation intact. Gait not checked.  PSYCHIATRIC: The patient is alert and oriented x 3.  SKIN: No obvious rash, lesion, or ulcer.   Physical Exam LABORATORY PANEL:   CBC Recent Labs  Lab 08/24/18 0424  WBC 8.7  HGB 7.3*  HCT 25.3*  PLT 200   ------------------------------------------------------------------------------------------------------------------  Chemistries  Recent Labs  Lab 08/21/18 1313  08/24/18 0424  NA 134*   < > 138  K 4.3   < > 4.7  CL 98   < > 106  CO2 22   < > 25  GLUCOSE 522*   < > 220*  BUN 31*   < > 62*  CREATININE 0.94   < > 1.26*  CALCIUM 8.6*   < > 8.5*  AST 50*  --   --   ALT 49*  --   --   ALKPHOS 198*  --   --   BILITOT 0.9  --   --    < > = values in this interval not displayed.   ------------------------------------------------------------------------------------------------------------------  Cardiac Enzymes No results for input(s): TROPONINI in the last 168 hours. ------------------------------------------------------------------------------------------------------------------  RADIOLOGY:  No results found.  ASSESSMENT AND PLAN:  63 year old female with a history of COPD who presents to the emergency room due to shortness of breath.  *Acute hypoxic respiratory failure  Stable due to acute exacerbation of COPD with acute bronchitis: CT chest shows no evidence of pulmonary emboli  Continue supplemental oxygen with weaning as tolerated  *Acute on COPD exacerbation Stable Continue oral prednisone   *Anemia suspect worsening of anemia of chronic disease we will transfuse 1 unit of packed RBCs due to symptomatic anemia.  Due to elevated MCV  I will check anemia panel   *Acute cystitis UTI ruled out Discontinue Levaquin   *Acute hyperglycemia with history of type II uncontrolled diabetes mellitus Exacerbated by steroid use, COPD exacerbation,     *Tachycardia heart rate improved continue  Cardizem   *Chronic tobacco smoking abuse/dependency  Nicotine patch ordered, cessation counseling   *Acute on chronic protein calorie malnutrition  Dietary consulted   *Chronic stage III sacral cubitus wound  Wound care nurse consulted  Continue local wound care   Discharge tomorrow  All the records are reviewed and case discussed with Care Management/Social Workerr. Management plans discussed with the patient, family and they are in agreement.  CODE STATUS: dnr  TOTAL TIME TAKING CARE OF THIS PATIENT:35 minutes.     POSSIBLE D/C IN 1-2 DAYS, DEPENDING ON CLINICAL CONDITION.   Auburn Bilberry M.D on 08/24/2018   Between 7am to 6pm - Pager - [phone]  After 6pm go to [email or web address] - password [email or web address]  [facility]  Office  [phone]  CC: Primary care physician; Mickel Fuchs, MD  Note: This dictation was prepared with Dragon dictation along with smaller phrase technology. Any transcriptional errors that result from this process are unintentional.

## 2018-08-25 ENCOUNTER — Encounter (INDEPENDENT_AMBULATORY_CARE_PROVIDER_SITE_OTHER): Payer: Self-pay | Admitting: Nurse Practitioner

## 2018-08-25 DIAGNOSIS — I739 Peripheral vascular disease, unspecified: Secondary | ICD-10-CM | POA: Insufficient documentation

## 2018-08-25 DIAGNOSIS — Z89512 Acquired absence of left leg below knee: Secondary | ICD-10-CM | POA: Insufficient documentation

## 2018-08-25 DIAGNOSIS — T8149XA Infection following a procedure, other surgical site, initial encounter: Secondary | ICD-10-CM | POA: Insufficient documentation

## 2018-08-25 LAB — TYPE AND SCREEN
ABO/RH(D): B POS
ANTIBODY SCREEN: NEGATIVE
Unit division: 0

## 2018-08-25 LAB — CBC
HCT: 30.8 % — ABNORMAL LOW (ref 36.0–46.0)
Hemoglobin: 9.8 g/dL — ABNORMAL LOW (ref 12.0–15.0)
MCH: 30.8 pg (ref 26.0–34.0)
MCHC: 31.8 g/dL (ref 30.0–36.0)
MCV: 96.9 fL (ref 80.0–100.0)
Platelets: 202 10*3/uL (ref 150–400)
RBC: 3.18 MIL/uL — ABNORMAL LOW (ref 3.87–5.11)
RDW: 17 % — ABNORMAL HIGH (ref 11.5–15.5)
WBC: 9.8 10*3/uL (ref 4.0–10.5)
nRBC: 0 % (ref 0.0–0.2)

## 2018-08-25 LAB — GLUCOSE, CAPILLARY
Glucose-Capillary: 354 mg/dL — ABNORMAL HIGH (ref 70–99)
Glucose-Capillary: 206 mg/dL — ABNORMAL HIGH (ref 70–99)

## 2018-08-25 LAB — BASIC METABOLIC PANEL
Anion gap: 9 (ref 5–15)
BUN: 63 mg/dL — AB (ref 8–23)
CO2: 27 mmol/L (ref 22–32)
CREATININE: 1 mg/dL (ref 0.44–1.00)
Calcium: 8.5 mg/dL — ABNORMAL LOW (ref 8.9–10.3)
Chloride: 101 mmol/L (ref 98–111)
GFR calc Af Amer: >60 mL/min (ref >60)
GFR calc non Af Amer: 60 mL/min — ABNORMAL LOW (ref >60)
Glucose, Bld: 365 mg/dL — ABNORMAL HIGH (ref 70–99)
Potassium: 3.8 mmol/L (ref 3.5–5.1)
Sodium: 137 mmol/L (ref 135–145)

## 2018-08-25 LAB — BPAM RBC
Blood Product Expiration Date: 202003172359
ISSUE DATE / TIME: 202002231302
Unit Type and Rh: 7300

## 2018-08-25 MED ORDER — ALBUTEROL SULFATE (2.5 MG/3ML) 0.083% IN NEBU: 2.5000 mg | INHALATION_SOLUTION | RESPIRATORY_TRACT | Status: DC | PRN

## 2018-08-25 MED ORDER — PREDNISONE 10 MG (21) PO TBPK: ORAL_TABLET | ORAL | 0 refills | Status: DC

## 2018-08-25 MED ORDER — HYDROCODONE-ACETAMINOPHEN 5-325 MG PO TABS: 1.0000 | ORAL_TABLET | Freq: Four times a day (QID) | ORAL | 0 refills | Status: AC | PRN

## 2018-08-25 MED ORDER — COLLAGENASE 250 UNIT/GM EX OINT: TOPICAL_OINTMENT | Freq: Every day | CUTANEOUS | 0 refills | Status: AC

## 2018-08-25 MED ORDER — IPRATROPIUM-ALBUTEROL 0.5-2.5 (3) MG/3ML IN SOLN: 3.0000 mL | Freq: Three times a day (TID) | RESPIRATORY_TRACT | 2 refills | Status: AC

## 2018-08-25 NOTE — Progress Notes (Signed)
Inpatient Diabetes Program Recommendations  AACE/ADA: New Consensus Statement on Inpatient Glycemic Control (2015)  Target Ranges:  Prepandial:   less than 140 mg/dL      Peak postprandial:   less than 180 mg/dL (1-2 hours)      Critically ill patients:  140 - 180 mg/dL   Lab Results  Component Value Date   GLUCAP 354 (H) 08/25/2018   HGBA1C 7.9 (H) 08/21/2018    Review of Glycemic Control Results for April [name], April [name] (MRN [id number]) as of 08/25/2018 08:47  Ref. Range 08/24/2018 07:32 08/24/2018 11:26 08/24/2018 16:45 08/24/2018 21:43 08/25/2018 07:55  Glucose-Capillary Latest Ref Range: 70 - 99 mg/dL 370 (H)  12 Units Novolog 272 (H)  16 Units Novolog 225 (H)  12 Units Novolog   383 (H) 354 (H) 25 Units Novolog   Home DM Meds:Lantus 15 units QHS Novolog 3 units TID with meals Metformin (Not sure if patient taking Metformin?)  Current Orders:Lantus 30 units QHS Novolog Resistant Correction Scale/ SSI (0-20 units) TID AC Novolog 5 units TID with meals  Inpatient Diabetes Program Recommendations:   Patient received 65 units Novolog correction over the past 24 hrs. Noted steroids now decreased to Prednisone 50 mg qd. -Increase Novolog meal coverage to 10 units tid if eats 50% -Add Novolog hs 0-5 units correction -Increase Lantus to 40 units and adjust with steroids -Change diet to carb modified  Thank you, Darel Hong E. Dedrick Heffner, RN, MSN, CDE  Diabetes Coordinator Inpatient Glycemic Control Team Team Pager [phone] (8am-5pm) 08/25/2018 9:14 AM

## 2018-08-25 NOTE — Care Management (Signed)
Patienthas sacral wound whichrequiresscraumtobepositionedinways  notfeasiblewithanormalbed. Sacral wound frequently requires frequentchangesinbodypositionwhichcannotbeachievedwithanormalbed

## 2018-08-25 NOTE — Progress Notes (Signed)
SUBJECTIVE:  Patient ID: April [name], female    DOB: [birth year], 63 y.o.   MRN: [id number] Chief Complaint  Patient presents with  . Follow-up    3 wk staple removal    HPI  April [name] is a 63 y.o. female that presents for staple removal of her bilateral groins and left below-knee amputation.  The below-knee amputation appears well approximated and with scant drainage.  The bilateral groin sites are erythematous, foul-smelling with purulent drainage.  The patient denies any fever, chills, nausea, vomiting or diarrhea.  She denies any chest pain or shortness of breath.  She does endorse having pain at all surgical sites.  Patient also endorses continuing to smoke.  Past Medical History:  Diagnosis Date  . Anxiety   . Colon polyps   . COPD (chronic obstructive pulmonary disease) (HCC)   . Diabetes mellitus without complication (HCC)   . DVT (deep venous thrombosis) (HCC)   . DVT (deep venous thrombosis) (HCC)   . History of colonic polyps   . Hyperlipidemia   . Peripheral artery disease (HCC)   . Pulmonary nodules/lesions, multiple 08/2012   NEGATIVE PET SCAN  . Retroperitoneal abscess (HCC)    history of in 04/2013  . Tobacco abuse     Past Surgical History:  Procedure Laterality Date  . AMPUTATION Left 07/24/2018   Procedure: AMPUTATION BELOW KNEE;  Surgeon: Annice Needy, MD;  Location: [facility];  Service: Vascular;  Laterality: Left;  . APPLICATION OF WOUND VAC Right 07/11/2018   Procedure: APPLICATION OF WOUND VAC;  Surgeon: Gwyneth Revels, DPM;  Location: [facility];  Service: Podiatry;  Laterality: Right;  . CHOLECYSTECTOMY    . COLONOSCOPY  05/2007  . ENDARTERECTOMY FEMORAL Bilateral 07/17/2018   Procedure: ENDARTERECTOMY FEMORAL - BILATERAL SFA STENT PLACEMENT - LEFT;  Surgeon: Annice Needy, MD;  Location: [facility];  Service: Vascular;  Laterality: Bilateral;  . IRRIGATION AND DEBRIDEMENT FOOT Bilateral 05/25/2018   Procedure: IRRIGATION AND DEBRIDEMENT FOOT  application wound vac left  foot;  Surgeon: Recardo Evangelist, DPM;  Location: [facility];  Service: Podiatry;  Laterality: Bilateral;  . IRRIGATION AND DEBRIDEMENT FOOT Right 07/11/2018   Procedure: FOOT DEBRIDEMENT;  Surgeon: Gwyneth Revels, DPM;  Location: [facility];  Service: Podiatry;  Laterality: Right;  . LOWER EXTREMITY ANGIOGRAPHY Left 05/26/2018   Procedure: Lower Extremity Angiography with possible intervention;  Surgeon: Annice Needy, MD;  Location: [facility]VASIVE CV [facility];  Service: Cardiovascular;  Laterality: Left;  . LOWER EXTREMITY ANGIOGRAPHY Right 05/27/2018   Procedure: Lower Extremity Angiography;  Surgeon: Renford Dills, MD;  Location: [facility] INVASIVE CV LAB;  Service: Cardiovascular;  Laterality: Right;  . LOWER EXTREMITY ANGIOGRAPHY Right 07/15/2018   Procedure: Lower Extremity Angiography;  Surgeon: Renford Dills, MD;  Location: [facility] INVAS[facility] CV LAB;  Service: Cardiovascular;  Laterality: Right;    Social History   Socioeconomic History  . Marital status: Divorced    Spouse name: Not on file  . Number of children: Not on file  . Years of education: Not on file  . Highest education level: Not on file  Occupational History  . Not on file  Social Needs  . Financial resource strain: Not on file  . Food insecurity:    Worry: Not on file    Inability: Not on file  . Transportation needs:    Medical: Not on file    Non-medical: Not on file  Tobacco Use  . Smoking status: Current Every Day  Smoker    Packs/day: 1.50    Years: 47.00    Pack years: 70.50    Types: Cigarettes  . Smokeless tobacco: Never Used  . Tobacco comment: Smoking History 2.5-started at age 3  Substance and Sexual Activity  . Alcohol use: No    Alcohol/week: 0.0 standard drinks  . Drug use: No  . Sexual activity: Not on file  Lifestyle  . Physical activity:    Days per week: Not on file    Minutes per session: Not on file  . Stress: Not on file  Relationships  . Social connections:     Talks on phone: Not on file    Gets together: Not on file    Attends religious service: Not on file    Active member of club or organization: Not on file    Attends meetings of clubs or organizations: Not on file    Relationship status: Not on file  . Intimate partner violence:    Fear of current or ex partner: Not on file    Emotionally abused: Not on file    Physically abused: Not on file    Forced sexual activity: Not on file  Other Topics Concern  . Not on file  Social History Narrative  . Not on file    Family History  Problem Relation Age of Onset  . Brain cancer Mother     Allergies  Allergen Reactions  . Eggs Or Egg-Derived Products Diarrhea, Itching and Nausea And Vomiting     Review of Systems   Review of Systems: Negative Unless Checked Constitutional: Weight loss  Fever  Chills Cardiac: Chest pain    Atrial Fibrillation  Palpitations   Shortness of breath when laying flat   Shortness of breath with exertion. Shortness of breath at rest Vascular:  Pain in legs with walking   Pain in legs with standing Pain in legs when laying flat   Claudication    Pain in feet when laying flat    History of DVT   Phlebitis   Swelling in legs   Varicose veins   Non-healing ulcers Pulmonary:   Uses home oxygen   Productive cough   Hemoptysis   Wheeze  COPD   Asthma Neurologic:  Dizziness   Seizures  Blackouts History of stroke   History of TIA  Aphasia   Temporary Blindness   Weakness or numbness in arm   Weakness or numbness in leg Musculoskeletal:   Joint swelling   Joint pain   Low back pain   History of Knee Replacement Arthritis back Surgeries   Spinal Stenosis    Hematologic:  Easy bruising  Easy bleeding   Hypercoagulable state   Anemic Gastrointestinal:  Diarrhea   Vomiting  Gastroesophageal reflux/heartburn   Difficulty swallowing. Abdominal pain Genitourinary:  Chronic  kidney disease   Difficult urination  Anuric   Blood in urine Frequent urination  Burning with urination   Hematuria Skin:  Rashes   Ulcers Wounds Psychological:  History of anxiety    History of major depression   Memory Difficulties      OBJECTIVE:   Physical Exam  BP 106/67   Pulse (!) 111   Resp 16   Ht  (1.575 m)   Wt 84 lb (38.1 kg)   BMI 15.36 kg/m   Gen: WD/WN, NAD Head: [place]/AT, No temporalis wasting.  Ear/Nose/Throat: Hearing grossly intact, nares w/o erythema or drainage Eyes: PER, EOMI, sclera nonicteric.  Neck: Supple, no masses.  No JVD.  Pulmonary:  Good air movement, no use of accessory muscles.  Cardiac: RRR Vascular:  Bilateral groin sites have copious amounts of fibrinous exudate, with some purulent drainage, both sides are very moist and erythematous.  Mostly well approximated.  Left lower extremity amputation site is well approximated with little drainage. Vessel Right Left  Radial Palpable Palpable   Gastrointestinal: soft, non-distended. No guarding/no peritoneal signs.  Musculoskeletal: M/S 5/5 throughout.    Left BKA neurologic: Pain and light touch intact in extremities.  Symmetrical.  Speech is fluent. Motor exam as listed above. Psychiatric: Judgment intact, Mood & affect appropriate for pt's clinical situation. Dermatologic: No Venous rashes. No Ulcers Noted.    Groin show evidence of surgical site infection Lymph : No Cervical lymphadenopathy, no lichenification or skin changes of chronic lymphedema.       ASSESSMENT AND PLAN:  1. PAD (peripheral artery disease) (HCC) Patient recently had a bilateral femoral endarterectomy.  Patient denies any claudication or rest pain at this time.  However, the patient is not very mobile due to wound VAC on right lower extremity due to diabetic foot infection.  Once we have her groin sites healing well we will bring her in for bilateral ABIs to assess her arterial status.  2.  History of left below knee amputation (HCC) Amputation site appears to be healing well.  Wound edges were well approximated.  Will return in 1 week for staple removal.  3. Surgical site infection We will place the patient on Keflex in order to try to treat bilateral groin sites.  Some staples were removed from both sites in order to allow wound to drain if necessary.  Will have patient return in approximately 1 week in order to assess the wound status as well as possibly remove the rest of the staples.  4. Hyperlipidemia, unspecified hyperlipidemia type Continue statin as ordered and reviewed, no changes at this time    Current Outpatient Medications on File Prior to Visit  Medication Sig Dispense Refill  . albuterol (PROVENTIL HFA;VENTOLIN HFA) 108 (90 Base) MCG/ACT inhaler Inhale 2 puffs into the lungs every 6 (six) hours as needed for wheezing or shortness of breath.    Marland Kitchen albuterol (PROVENTIL) (2.5 MG/3ML) 0.083% nebulizer solution Inhale 2.5 mg into the lungs every 6 (six) hours as needed for wheezing or shortness of breath.     . ALPRAZolam (XANAX) 0.5 MG tablet Take 1 tablet (0.5 mg total) by mouth 2 (two) times daily as needed for anxiety. 30 tablet 0  . atorvastatin (LIPITOR) 40 MG tablet Take 40 mg by mouth daily.    . Fluticasone-Salmeterol (ADVAIR DISKUS) 250-50 MCG/DOSE AEPB Inhale 1 puff into the lungs 2 (two) times daily. 1 each 0  . furosemide (LASIX) 40 MG tablet Take 1 tablet (40 mg total) by mouth daily. 30 tablet 0  . gabapentin (NEURONTIN) 100 MG capsule Take 1 capsule (100 mg total) by mouth at bedtime.    Marland Kitchen guaiFENesin-dextromethorphan (ROBITUSSIN DM) 100-10 MG/5ML syrup Take 5 mLs by mouth every 4 (four) hours as needed for cough. 118 mL 0  . insulin aspart (NOVOLOG) 100 UNIT/ML injection Inject 3 Units into the skin 3 (three) times daily with meals. 10 mL 11  . insulin glargine (LANTUS) 100 UNIT/ML injection Inject 0.15 mLs (15 Units total) into the skin at bedtime. 10  mL 11  . metFORMIN (GLUCOPHAGE) 500 MG tablet     . midodrine (PROAMATINE) 10 MG tablet Take 1 tablet (10 mg total)  by mouth 3 (three) times daily with meals.    . nicotine (NICODERM CQ - DOSED IN MG/24 HOURS) 14 mg/24hr patch Place 1 patch (14 mg total) onto the skin daily as needed (nicotine withdrawal). 28 patch 0  . pantoprazole (PROTONIX) 40 MG tablet Take 40 mg by mouth 2 (two) times daily.     . polyethylene glycol (MIRALAX / GLYCOLAX) packet Take 17 g by mouth daily. 14 each 0  . potassium chloride (K-DUR,KLOR-CON) 10 MEQ tablet Take by mouth.    . protein supplement shake (PREMIER PROTEIN) LIQD Take 325 mLs (11 oz total) by mouth 2 (two) times daily between meals. 11 Can 0  . rivaroxaban (XARELTO) 10 MG TABS tablet Take 10 mg by mouth every evening.    Marland Kitchen SPIRIVA HANDIHALER 18 MCG inhalation capsule Place 18 mcg into inhaler and inhale daily.     . tamsulosin (FLOMAX) 0.4 MG CAPS capsule Take 1 capsule (0.4 mg total) by mouth daily. 30 capsule 0  . vitamin C (VITAMIN C) 500 MG tablet Take 1 tablet (500 mg total) by mouth 2 (two) times daily. 60 tablet 0  . aspirin 81 MG chewable tablet Chew 81 mg by mouth daily.    . ferrous sulfate 325 (65 FE) MG tablet Take 1 tablet (325 mg total) by mouth 2 (two) times daily with a meal. 60 tablet 0   No current facility-administered medications on file prior to visit.     There are no Patient Instructions on file for this visit. No follow-ups on file.   Georgiana Spinner, NP  This note was completed with Office manager.  Any errors are purely unintentional.

## 2018-08-25 NOTE — Progress Notes (Signed)
Discharge order received. Patient is alert and oriented. Vital signs stable . No signs of acute distress. Discharge instructions given. Patient verbalized understanding. No other issues noted at this time. Granddaughter was educated about wound care. She verbalized understanding.

## 2018-08-25 NOTE — Discharge Summary (Signed)
[facility] - [place] at [facility], 63 y.o., DOB 27-Apr-1956, MRN [id number]. Admission date: 08/21/2018 Discharge Date 08/25/2018 Primary MD Mickel Fuchs, MD Admitting Physician Adrian Saran, MD  Admission Diagnosis  Respiratory distress [R06.03] COPD exacerbation [facility]) [J44.1]  Discharge Diagnosis   Active Problems: Acute hypoxic respiratory failure  Acute on chronic COPD exasperation Acute anemia on chronic anemia Acute cystitis UTI ruled out Hyperglycemia with type 2 diabetes Chronic tobacco use Chronic decubitus ulcers Multiple other wounds on her body  Hospital Course April [name]  is a 63 y.o. female with a known history of COPD, peripheral arterial disease with recent left lower leg amputation who presents to the emergency room due to shortness of breath and cough.  Over the past 2 days patient has developed increasing shortness of breath, wheezing and nonproductive cough.  Patient was seen in the ED and required oxygen she was admitted for COPD exasperation.  Patient now needs oxygen for discharge.  She is stable she was seen by PT recommended home physical therapy.  During hospitalization she also was noted to have drop in hemoglobin requiring transfusion.             Consults  None  Significant Tests:  See full reports for all details     Ct Angio Chest Pe W And/or Wo Contrast  Result Date: 08/21/2018 CLINICAL DATA:  Short of breath rule out pulmonary embolism. Recent left leg amputation. Diabetes. EXAM: CT ANGIOGRAPHY CHEST WITH CONTRAST TECHNIQUE: Multidetector CT imaging of the chest was performed using the standard protocol during bolus administration of intravenous contrast. Multiplanar CT image reconstructions and MIPs were obtained to evaluate the vascular anatomy. CONTRAST:  75mL OMNIPAQUE IOHEXOL 350 MG/ML SOLN COMPARISON:  Chest 08/21/2017 FINDINGS: Cardiovascular: Negative for pulmonary embolism. Pulmonary arteries normal  in size. Cardiac enlargement with left ventricular dilatation. Moderate coronary calcification. Atherosclerotic aortic arch without aneurysm or dissection. Mediastinum/Nodes: Negative for mass or adenopathy. Lungs/Pleura: Small bilateral pleural effusions. Mild dependent bibasilar atelectasis. Negative for infiltrate or mass. Upper Abdomen: No acute abnormality. Musculoskeletal: No acute abnormality. Review of the MIP images confirms the above findings. IMPRESSION: Negative for pulmonary embolism. Small bilateral pleural effusions with mild bibasilar atelectasis Left ventricular enlargement.  Coronary calcification. Aortic Atherosclerosis (ICD10-I70.0). Electronically Signed   By: Marlan Palau M.D.   On: 08/21/2018 16:03   Dg Chest Port 1 View  Result Date: 08/21/2018 CLINICAL DATA:  Shortness of breath. EXAM: PORTABLE CHEST 1 VIEW COMPARISON:  Radiograph of July 20, 2018. FINDINGS: The heart size and mediastinal contours are within normal limits. No pneumothorax is noted. Minimal bibasilar subsegmental atelectasis or edema is noted with minimal pleural effusions. The visualized skeletal structures are unremarkable. IMPRESSION: Minimal bibasilar subsegmental atelectasis or edema is noted with minimal pleural effusions. Electronically Signed   By: Lupita Raider, M.D.   On: 08/21/2018 13:40       Today   Subjective:   April [name] patient doing well denies any complaints Objective:   Blood pressure 120/67, pulse (!) 103, temperature 97.7 F (36.5 C), temperature source Oral, resp. rate 16, height  (1.575 m), weight 41.3 kg, SpO2 95 %.  .  Intake/Output Summary (Last 24 hours) at 08/25/2018 1332 Last data filed at 08/25/2018 0601 Gross per 24 hour  Intake 450 ml  Output 1900 ml  Net -1450 ml    Exam VITAL SIGNS: Blood pressure 120/67, pulse (!) 103, temperature 97.7 F (36.5 C), temperature source Oral, resp. rate  16, height 5\' 2"  (1.575 m), weight 41.3 kg, SpO2 95  %.  GENERAL:  63 y.o.-year-old patient lying in the bed with no acute distress.  EYES: Pupils equal, round, reactive to light and accommodation. No scleral icterus. Extraocular muscles intact.  HEENT: Head atraumatic, normocephalic. Oropharynx and nasopharynx clear.  NECK:  Supple, no jugular venous distention. No thyroid enlargement, no tenderness.  LUNGS: Normal breath sounds bilaterally, no wheezing, rales,rhonchi or crepitation. No use of accessory muscles of respiration.  CARDIOVASCULAR: S1, S2 normal. No murmurs, rubs, or gallops.  ABDOMEN: Soft, nontender, nondistended. Bowel sounds present. No organomegaly or mass.  EXTREMITIES: No pedal edema, cyanosis, or clubbing.  NEUROLOGIC: Cranial nerves II through XII are intact. Muscle strength 5/5 in all extremities. Sensation intact. Gait not checked.  PSYCHIATRIC: The patient is alert and oriented x 3.  SKIN: No obvious rash, lesion, or ulcer.   Data Review     CBC w Diff:  Lab Results  Component Value Date   WBC 9.8 08/25/2018   HGB 9.8 (L) 08/25/2018   HGB 13.6 10/14/2014   HCT 30.8 (L) 08/25/2018   HCT 41.3 10/14/2014   PLT 202 08/25/2018   PLT 156 10/14/2014   LYMPHOPCT 10 08/21/2018   LYMPHOPCT 37.4 10/14/2014   MONOPCT 6 08/21/2018   MONOPCT 7.4 10/14/2014   EOSPCT 0 08/21/2018   EOSPCT 3.9 10/14/2014   BASOPCT 0 08/21/2018   BASOPCT 0.4 10/14/2014   CMP:  Lab Results  Component Value Date   NA 137 08/25/2018   NA 142 10/14/2014   K 3.8 08/25/2018   K 3.6 10/14/2014   CL 101 08/25/2018   CL 109 10/14/2014   CO2 27 08/25/2018   CO2 29 10/14/2014   BUN 63 (H) 08/25/2018   BUN 9 10/14/2014   CREATININE 1.00 08/25/2018   CREATININE 0.53 10/14/2014   PROT 7.2 08/21/2018   PROT 5.4 (L) 10/14/2014   ALBUMIN 3.0 (L) 08/21/2018   ALBUMIN 2.7 (L) 10/14/2014   BILITOT 0.9 08/21/2018   BILITOT <0.1 (L) 10/14/2014   ALKPHOS 198 (H) 08/21/2018   ALKPHOS 87 10/14/2014   AST 50 (H) 08/21/2018   AST 11 (L)  10/14/2014   ALT 49 (H) 08/21/2018   ALT 8 (L) 10/14/2014  .  Micro Results Recent Results (from the past 240 hour(s))  Blood Culture (routine x 2)     Status: None (Preliminary result)   Collection Time: 08/21/18  1:20 PM  Result Value Ref Range Status   Specimen Description BLOOD RIGHT ANTECUBITAL  Final   Special Requests   Final    BOTTLES DRAWN AEROBIC AND ANAEROBIC Blood Culture results may not be optimal due to an inadequate volume of blood received in culture bottles   Culture   Final    NO GROWTH 4 DAYS Performed at [facility], [address] [place], [place] [address]    Report Status PENDING  Incomplete  Urine culture     Status: None   Collection Time: 08/21/18  2:10 PM  Result Value Ref Range Status   Specimen Description   Final    URINE, RANDOM Performed at [facility], [address] [place], [place] [address]    Special Requests   Final    NONE Performed at [facility], [address] [place], [place] [address]    Culture   Final    NO GROWTH Performed at [facility], [address] [place], [place] [address]  Report Status 08/22/2018 FINAL  Final  MRSA PCR Screening     Status: Abnormal   Collection Time: 08/22/18  9:46 AM  Result Value Ref Range Status   MRSA by PCR POSITIVE (A) NEGATIVE Final    Comment:        The GeneXpert MRSA Assay (FDA approved for NASAL specimens only), is one component of a comprehensive MRSA colonization surveillance program. It is not intended to diagnose MRSA infection nor to guide or monitor treatment for MRSA infections. CRITICAL RESULT CALLED TO, READ BACK BY AND VERIFIED WITH: NOTIFIED Jerrell Mylar AT 1158 08/22/2018 SMA Performed at [facility], [address] [place], [place] [address]         Code Status Orders  (From admission, onward)         Start     Ordered   08/21/18 1658  Do not attempt resuscitation (DNR)  Continuous    Question Answer  Comment  In the event of cardiac or respiratory ARREST Do not call a "code blue"   In the event of cardiac or respiratory ARREST Do not perform Intubation, CPR, defibrillation or ACLS   In the event of cardiac or respiratory ARREST Use medication by any route, position, wound care, and other measures to relive pain and suffering. May use oxygen, suction and manual treatment of airway obstruction as needed for comfort.      08/21/18 1658        Code Status History    Date Active Date Inactive Code Status Order ID Comments User Context   08/21/2018 1656 08/21/2018 1658 DNR [id number]  Adrian Saran, MD ED   07/21/2018 1302 07/26/2018 2148 DNR [id number]  Glee Arvin, NP Inpatient   07/09/2018 1730 07/21/2018 1302 Partial Code [id number]  Mayo, Allyn Kenner, MD Inpatient   06/03/2018 0621 06/13/2018 2013 Full Code [id number]  Arnaldo Natal, MD Inpatient   05/23/2018 0253 06/01/2018 2158 Full Code [id number]  Cammy Copa, MD Inpatient   05/23/2018 0138 05/23/2018 0253 DNR [id number]  Cammy Copa, MD ED   04/10/2018 1111 04/11/2018 1554 DNR [id number]  Milagros Loll, MD Inpatient   04/09/2018 1519 04/10/2018 1111 Full Code [id number]  Milagros Loll, MD Inpatient   04/09/2018 1337 04/09/2018 1519 DNR [id number]  Milagros Loll, MD Inpatient   04/09/2018 0012 04/09/2018 1337 Full Code [id number]  Oralia Manis, MD ED          Follow-up Information    Mickel Fuchs, MD. Go on 09/12/2018.   Specialty:  Family Medicine Why:  @10am  Contact information: [address] [place] [address] [phone]        [facility] In 1 week.   Specialty:  Wound Care Why:  decubitus ulcer Contact information: [address] [phone] ar [phone]          Discharge Medications   Allergies as of 08/25/2018      Reactions   Eggs Or Egg-derived Products Diarrhea, Itching, Nausea And Vomiting      Medication List    STOP  taking these medications   cephALEXin 500 MG capsule Commonly known as:  KEFLEX     TAKE these medications   albuterol (2.5 MG/3ML) 0.083% nebulizer solution Commonly known as:  PROVENTIL Inhale 2.5 mg into the lungs every 6 (six) hours as needed for wheezing or shortness of breath.   albuterol 108 (90 Base) MCG/ACT inhaler Commonly known as:  PROVENTIL HFA;VENTOLIN HFA Inhale 2 puffs into the  lungs every 6 (six) hours as needed for wheezing or shortness of breath.   ALPRAZolam 0.5 MG tablet Commonly known as:  XANAX Take 1 tablet (0.5 mg total) by mouth 2 (two) times daily as needed for anxiety.   ascorbic acid 500 MG tablet Commonly known as:  VITAMIN C Take 1 tablet (500 mg total) by mouth 2 (two) times daily.   aspirin 81 MG chewable tablet Chew 81 mg by mouth daily.   atorvastatin 40 MG tablet Commonly known as:  LIPITOR Take 40 mg by mouth daily.   collagenase ointment Commonly known as:  SANTYL Apply topically daily.   ferrous sulfate 325 (65 FE) MG tablet Take 1 tablet (325 mg total) by mouth 2 (two) times daily with a meal.   Fluticasone-Salmeterol 250-50 MCG/DOSE Aepb Commonly known as:  ADVAIR DISKUS Inhale 1 puff into the lungs 2 (two) times daily.   furosemide 40 MG tablet Commonly known as:  LASIX Take 1 tablet (40 mg total) by mouth daily.   gabapentin 100 MG capsule Commonly known as:  NEURONTIN Take 1 capsule (100 mg total) by mouth at bedtime.   guaiFENesin-dextromethorphan 100-10 MG/5ML syrup Commonly known as:  ROBITUSSIN DM Take 5 mLs by mouth every 4 (four) hours as needed for cough.   HYDROcodone-acetaminophen 5-325 MG tablet Commonly known as:  NORCO Take 1 tablet by mouth every 6 (six) hours as needed for up to 7 days for moderate pain. What changed:  Another medication with the same name was removed. Continue taking this medication, and follow the directions you see here.   insulin aspart 100 UNIT/ML injection Commonly known as:   novoLOG Inject 3 Units into the skin 3 (three) times daily with meals.   insulin glargine 100 UNIT/ML injection Commonly known as:  LANTUS Inject 0.15 mLs (15 Units total) into the skin at bedtime.   ipratropium-albuterol 0.5-2.5 (3) MG/3ML Soln Commonly known as:  DUONEB Take 3 mLs by nebulization 3 (three) times daily.   metFORMIN 500 MG tablet Commonly known as:  GLUCOPHAGE   midodrine 10 MG tablet Commonly known as:  PROAMATINE Take 1 tablet (10 mg total) by mouth 3 (three) times daily with meals.   nicotine 14 mg/24hr patch Commonly known as:  NICODERM CQ - dosed in mg/24 hours Place 1 patch (14 mg total) onto the skin daily as needed (nicotine withdrawal).   pantoprazole 40 MG tablet Commonly known as:  PROTONIX Take 40 mg by mouth 2 (two) times daily.   polyethylene glycol packet Commonly known as:  MIRALAX / GLYCOLAX Take 17 g by mouth daily.   potassium chloride 10 MEQ tablet Commonly known as:  K-DUR,KLOR-CON Take by mouth.   predniSONE 10 MG (21) Tbpk tablet Commonly known as:  STERAPRED UNI-PAK 21 TAB Start at  taper by  until complete   protein supplement shake Liqd Commonly known as:  PREMIER PROTEIN Take 325 mLs (11 oz total) by mouth 2 (two) times daily between meals.   SPIRIVA HANDIHALER 18 MCG inhalation capsule Generic drug:  tiotropium Place 18 mcg into inhaler and inhale daily.   tamsulosin 0.4 MG Caps capsule Commonly known as:  FLOMAX Take 1 capsule (0.4 mg total) by mouth daily.   XARELTO 10 MG Tabs tablet Generic drug:  rivaroxaban Take 10 mg by mouth every evening.            Durable Medical Equipment  (From admission, onward)         Start  Ordered   08/25/18 0959  DME Oxygen  Once    Question Answer Comment  Mode or (Route) Nasal cannula   Liters per Minute 2   Frequency Continuous (stationary and portable oxygen unit needed)   Oxygen delivery system Gas      08/25/18 1003   08/22/18 1613  For home use  only DME Hospital bed  Once    Question Answer Comment  The above medical condition requires: Patient requires the ability to reposition frequently   Bed type Semi-electric   Support Surface: Low Air loss Mattress      08/22/18 1613             Total Time in preparing paper work, data evaluation and todays exam - 35 minutes  Auburn Bilberry M.D on 08/25/2018 at 1:32 PM [facility]   Office  [phone]

## 2018-08-25 NOTE — Progress Notes (Addendum)
SATURATION QUALIFICATIONS: (This note is used to comply with regulatory documentation for home oxygen)  Patient Saturations on Room Air at Rest =    96 %  Patient Saturations on Room Air while exertion ( transferred back and forth to chair and bed, since pt. baseline maximum mobility is only transferring. Pt is unable to walk )= 93 %

## 2018-08-25 NOTE — Consult Note (Signed)
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 Vascular access wound to left inguinal fold with staples intact. Scheduled for staples to be removed 08/28/18 and has MD appointment  On that date.  1 Area includes 2 staples with scabbed nonintact center.  I informed her that these staples may not be removed.  To continue a daily dry dressing.  Wound type:Surgical Pressure Injury POA: Yes Measurement: Left inguinal:  8 cm staple line with 0.5 cm nonapproximated segment with scabbing and purulence noted.  Wound UVO:ZDGUYQI Drainage (amount, consistency, odor) minimal purulence when pressed Periwound:intact Dressing procedure/placement/frequency:Dry dressing to left inguinal fold daily. All other orders as previously entered.  Will not follow at this time.  Please re-consult if needed.  Maple Hudson MSN, RN, FNP-BC CWON Wound, Ostomy, Continence Nurse Pager [phone]

## 2018-08-25 NOTE — Care Management Note (Signed)
Case Management Note  Patient Details  Name: NATURI MCCORKLE MRN: [id number] Date of Birth: 19-Dec-1955   Patient to discharge home today.  [place] with [facility] notified of discharge.  Bedside RN to educate family on dressing changes.  Family to transport at discharge.  Orders for hospital bed and low air loss mattress faxed to Fayrene Fearing with [facility].  Patient does not qualify for home O2 at discharge   Subjective/Objective:                    Action/Plan:   Expected Discharge Date:  08/25/18               Expected Discharge Plan:  Home w Home Health Services  In-House Referral:     Discharge planning Services  CM Consult  Post Acute Care Choice:  Resumption of Svcs/PTA Provider Choice offered to:  Patient  DME Arranged:  Hospital bed DME Agency:  [facility]  HH Arranged:  RN, PT, Nurse's Aide, Social Work [employer] Agency:  [facility]  Status of Service:  Completed, signed off  If discussed at [employer], dates discussed:    Additional Comments:  Chapman Fitch, RN 08/25/2018, 3:05 PM

## 2018-08-26 LAB — CULTURE, BLOOD (ROUTINE X 2): CULTURE: NO GROWTH

## 2018-08-28 ENCOUNTER — Ambulatory Visit (INDEPENDENT_AMBULATORY_CARE_PROVIDER_SITE_OTHER): Payer: Medicare Other | Admitting: Nurse Practitioner

## 2018-08-28 ENCOUNTER — Encounter (INDEPENDENT_AMBULATORY_CARE_PROVIDER_SITE_OTHER): Payer: Self-pay | Admitting: Nurse Practitioner

## 2018-08-28 VITALS — BP 120/77 | HR 96 | Resp 16

## 2018-08-28 DIAGNOSIS — Z79899 Other long term (current) drug therapy: Secondary | ICD-10-CM

## 2018-08-28 DIAGNOSIS — F17210 Nicotine dependence, cigarettes, uncomplicated: Secondary | ICD-10-CM

## 2018-08-28 DIAGNOSIS — J441 Chronic obstructive pulmonary disease with (acute) exacerbation: Secondary | ICD-10-CM

## 2018-08-28 DIAGNOSIS — Z89512 Acquired absence of left leg below knee: Secondary | ICD-10-CM

## 2018-08-28 DIAGNOSIS — E785 Hyperlipidemia, unspecified: Secondary | ICD-10-CM

## 2018-08-28 DIAGNOSIS — T8149XA Infection following a procedure, other surgical site, initial encounter: Secondary | ICD-10-CM

## 2018-09-01 ENCOUNTER — Telehealth (INDEPENDENT_AMBULATORY_CARE_PROVIDER_SITE_OTHER): Payer: Self-pay | Admitting: Nurse Practitioner

## 2018-09-01 NOTE — Telephone Encounter (Signed)
Patient needs to be seen in our office this week with Seldon Plumber. Thank you

## 2018-09-02 ENCOUNTER — Ambulatory Visit (INDEPENDENT_AMBULATORY_CARE_PROVIDER_SITE_OTHER): Payer: Medicare Other | Admitting: Nurse Practitioner

## 2018-09-02 ENCOUNTER — Encounter (INDEPENDENT_AMBULATORY_CARE_PROVIDER_SITE_OTHER): Payer: Self-pay | Admitting: Nurse Practitioner

## 2018-09-02 VITALS — BP 121/75 | HR 101 | Resp 18

## 2018-09-02 DIAGNOSIS — L0231 Cutaneous abscess of buttock: Secondary | ICD-10-CM

## 2018-09-02 DIAGNOSIS — Z79899 Other long term (current) drug therapy: Secondary | ICD-10-CM

## 2018-09-02 DIAGNOSIS — Z89512 Acquired absence of left leg below knee: Secondary | ICD-10-CM

## 2018-09-02 DIAGNOSIS — T8149XA Infection following a procedure, other surgical site, initial encounter: Secondary | ICD-10-CM

## 2018-09-02 DIAGNOSIS — E785 Hyperlipidemia, unspecified: Secondary | ICD-10-CM

## 2018-09-02 MED ORDER — CEPHALEXIN 500 MG PO CAPS: 500.0000 mg | ORAL_CAPSULE | Freq: Two times a day (BID) | ORAL | 0 refills | Status: DC

## 2018-09-02 MED ORDER — TRAMADOL HCL 50 MG PO TABS: 50.0000 mg | ORAL_TABLET | Freq: Four times a day (QID) | ORAL | 0 refills | Status: AC | PRN

## 2018-09-03 ENCOUNTER — Encounter (INDEPENDENT_AMBULATORY_CARE_PROVIDER_SITE_OTHER): Payer: Self-pay | Admitting: Nurse Practitioner

## 2018-09-03 NOTE — Progress Notes (Signed)
SUBJECTIVE:  Patient ID: April [name], female    DOB: Apr 18, 1956, 63 y.o.   MRN: [id number] No chief complaint on file.   HPI  April [name] is a 63 y.o. female presents today with concerns for possible bedsore as well as oozing from left groin site.  The patient states that on her left buttock there is something hard and exquisitely painful.  Upon evaluation there is a hard erythematous area on her buttocks.  There is no open area.  This appears to be more of an abscess versus bedsore.  Left groin recently had staples removed and there is an area of purulent drainage that is slightly foul-smelling.  Right groin is well approximated.  This groin has not dehisced however the wound edges at this area are not well approximated.  She denies any fever, chills, nausea, vomiting or diarrhea.  She denies any chest pain or shortness of breath.  She denies any TIA-like symptoms  Past Medical History:  Diagnosis Date  . Anxiety   . Colon polyps   . COPD (chronic obstructive pulmonary disease) (HCC)   . Diabetes mellitus without complication (HCC)   . DVT (deep venous thrombosis) (HCC)   . DVT (deep venous thrombosis) (HCC)   . History of colonic polyps   . Hyperlipidemia   . Peripheral artery disease (HCC)   . Pulmonary nodules/lesions, multiple 08/2012   NEGATIVE PET SCAN  . Retroperitoneal abscess (HCC)    history of in 04/2013  . Tobacco abuse     Past Surgical History:  Procedure Laterality Date  . AMPUTATION Left 07/24/2018   Procedure: AMPUTATION BELOW KNEE;  Surgeon: Annice Needy, MD;  Location: [facility];  Service: Vascular;  Laterality: Left;  . APPLICATION OF WOUND VAC Right 07/11/2018   Procedure: APPLICATION OF WOUND VAC;  Surgeon: Gwyneth Revels, DPM;  Location: [facility];  Service: Podiatry;  Laterality: Right;  . CHOLECYSTECTOMY    . COLONOSCOPY  05/2007  . ENDARTERECTOMY FEMORAL Bilateral 07/17/2018   Procedure: ENDARTERECTOMY FEMORAL - BILATERAL SFA STENT PLACEMENT -  LEFT;  Surgeon: Annice Needy, MD;  Location: [facility];  Service: Vascular;  Laterality: Bilateral;  . IRRIGATION AND DEBRIDEMENT FOOT Bilateral 05/25/2018   Procedure: IRRIGATION AND DEBRIDEMENT FOOT application wound vac left  foot;  Surgeon: Recardo Evangelist, DPM;  Location: [facility];  Service: Podiatry;  Laterality: Bilateral;  . IRRIGATION AND DEBRIDEMENT FOOT Right 07/11/2018   Procedure: FOOT DEBRIDEMENT;  Surgeon: Gwyneth Revels, DPM;  Location: [facility];  Service: Podiatry;  Laterality: Right;  . LOWER EXTREMITY ANGIOGRAPHY Left 05/26/2018   Procedure: Lower Extremity Angiography with possible intervention;  Surgeon: Annice Needy, MD;  Location: [facility]AS[facility];  Service: Cardiovascular;  Laterality: Left;  . LOWER EXTREMITY ANGIOGRAPHY Right 05/27/2018   Procedure: Lower Extremity Angiography;  Surgeon: Renford Dills, MD;  Location: [facility] INVASIVE CV [facility];  Service: Cardiovascular;  Laterality: Right;  . LOWER EXTREMITY ANGIOGRAPHY Right 07/15/2018   Procedure: Lower Extremity Angiography;  Surgeon: Renford Dills, MD;  Location: [facility] INVASIVE CV LAB;  Service: Cardiovascular;  Laterality: Right;    Social History   Socioeconomic History  . Marital status: Divorced    Spouse name: Not on file  . Number of children: Not on file  . Years of education: Not on file  . Highest education level: Not on file  Occupational History  . Not on file  Social Needs  . Financial resource strain: Not on file  . Food  insecurity:    Worry: Not on file    Inability: Not on file  . Transportation needs:    Medical: Not on file    Non-medical: Not on file  Tobacco Use  . Smoking status: Current Every Day Smoker    Packs/day: 1.50    Years: 47.00    Pack years: 70.50    Types: Cigarettes  . Smokeless tobacco: Never Used  . Tobacco comment: Smoking History 2.5-started at age 63  Substance and Sexual Activity  . Alcohol use: No    Alcohol/week: 0.0 standard drinks  . Drug use: No    . Sexual activity: Not on file  Lifestyle  . Physical activity:    Days per week: Not on file    Minutes per session: Not on file  . Stress: Not on file  Relationships  . Social connections:    Talks on phone: Not on file    Gets together: Not on file    Attends religious service: Not on file    Active member of club or organization: Not on file    Attends meetings of clubs or organizations: Not on file    Relationship status: Not on file  . Intimate partner violence:    Fear of current or ex partner: Not on file    Emotionally abused: Not on file    Physically abused: Not on file    Forced sexual activity: Not on file  Other Topics Concern  . Not on file  Social History Narrative  . Not on file    Family History  Problem Relation Age of Onset  . Brain cancer Mother     Allergies  Allergen Reactions  . Eggs Or Egg-Derived Products Diarrhea, Itching and Nausea And Vomiting     Review of Systems   Review of Systems: Negative Unless Checked Constitutional: [] Weight loss  [] Fever  [] Chills Cardiac: [] Chest pain   []  Atrial Fibrillation  [] Palpitations   [] Shortness of breath when laying flat   [] Shortness of breath with exertion. [] Shortness of breath at rest Vascular:  [] Pain in legs with walking   [] Pain in legs with standing [] Pain in legs when laying flat   [] Claudication    [] Pain in feet when laying flat    [] History of DVT   [] Phlebitis   [] Swelling in legs   [] Varicose veins   [] Non-healing ulcers Pulmonary:   [] Uses home oxygen   [] Productive cough   [] Hemoptysis   [] Wheeze  [x] COPD   [] Asthma Neurologic:  [] Dizziness   [] Seizures  [] Blackouts [] History of stroke   [] History of TIA  [] Aphasia   [] Temporary Blindness   [] Weakness or numbness in arm   [x] Weakness or numbness in leg Musculoskeletal:   [] Joint swelling   [] Joint pain   [] Low back pain  []  History of Knee Replacement [] Arthritis [] back Surgeries  []  Spinal Stenosis    Hematologic:  [] Easy bruising   [] Easy bleeding   [] Hypercoagulable state   [] Anemic Gastrointestinal:  [] Diarrhea   [] Vomiting  [] Gastroesophageal reflux/heartburn   [] Difficulty swallowing. [] Abdominal pain Genitourinary:  [] Chronic kidney disease   [] Difficult urination  [] Anuric   [] Blood in urine [] Frequent urination  [] Burning with urination   [] Hematuria Skin:  [] Rashes   [] Ulcers [x] Wounds Psychological:  [] History of anxiety   []  History of major depression  []  Memory Difficulties      OBJECTIVE:   Physical Exam  BP 121/75 (BP Location: Left Arm)   Pulse (!) 101   Resp 18  Gen: WD/WN, NAD Head: [place]/AT, No temporalis wasting.  Ear/Nose/Throat: Hearing grossly intact, nares w/o erythema or drainage Eyes: PER, EOMI, sclera nonicteric.  Neck: Supple, no masses.  No JVD.  Pulmonary:  Good air movement, no use of accessory muscles.  Cardiac: RRR Vascular:  Vessel Right Left  Radial Palpable Palpable   Gastrointestinal: soft, non-distended. No guarding/no peritoneal signs.  Musculoskeletal: M/S 5/5 throughout.    Left below-knee amputation Neurologic: Pain and light touch intact in extremities.  Symmetrical.  Speech is fluent. Motor exam as listed above. Psychiatric: Judgment intact, Mood & affect appropriate for pt's clinical situation. Dermatologic: Left buttock has hard erythematous area.  The left groin has purulent drainage and fibrinous exudate near the distal end of the incision.  It is not fully dehisced but there is not approximation.   Lymph : No Cervical lymphadenopathy, no lichenification or skin changes of chronic lymphedema.       ASSESSMENT AND PLAN:  1. Surgical site infection Left groin site following femoral endarterectomy is draining purulent drainage with heavy amount of fibrinous exudate near the distal portion.  The wound is not fully approximated.  Previously the wound looked good however the patient did not finish the full course of antibiotics as they were stopped due to hospital  stay.  This time we will attempt to lengthen the course of antibiotics and see her back in 2 weeks.  Patient advised to contact us if there are any changes prior to that time.  2. History of left below knee amputation (HCC) Amputation site healing well so far.  All staples have been removed no evidence of cellulitis or infection we will continue to monitor for for wound healing.  3. Hyperlipidemia, unspecified hyperlipidemia type Continue statin as ordered and reviewed, no changes at this time   4. Abscess of buttock, left Antibiotics for the surgical site infection should also treat this abscess.  If this is still present at follow-up visit we will defer to primary care provider for further work-up.    Current Outpatient Medications on File Prior to Visit  Medication Sig Dispense Refill  . albuterol (PROVENTIL HFA;VENTOLIN HFA) 108 (90 Base) MCG/ACT inhaler Inhale 2 puffs into the lungs every 6 (six) hours as needed for wheezing or shortness of breath.    Marland Kitchen albuterol (PROVENTIL) (2.5 MG/3ML) 0.083% nebulizer solution Inhale 2.5 mg into the lungs every 6 (six) hours as needed for wheezing or shortness of breath.     . ALPRAZolam (XANAX) 0.5 MG tablet Take 1 tablet (0.5 mg total) by mouth 2 (two) times daily as needed for anxiety. 30 tablet 0  . aspirin 81 MG chewable tablet Chew 81 mg by mouth daily.    Marland Kitchen atorvastatin (LIPITOR) 40 MG tablet Take 40 mg by mouth daily.    . collagenase (SANTYL) ointment Apply topically daily. 15 g 0  . Fluticasone-Salmeterol (ADVAIR DISKUS) 250-50 MCG/DOSE AEPB Inhale 1 puff into the lungs 2 (two) times daily. 1 each 0  . furosemide (LASIX) 40 MG tablet Take 1 tablet (40 mg total) by mouth daily. 30 tablet 0  . gabapentin (NEURONTIN) 100 MG capsule Take 1 capsule (100 mg total) by mouth at bedtime.    Marland Kitchen guaiFENesin-dextromethorphan (ROBITUSSIN DM) 100-10 MG/5ML syrup Take 5 mLs by mouth every 4 (four) hours as needed for cough. 118 mL 0  . insulin aspart  (NOVOLOG) 100 UNIT/ML injection Inject 3 Units into the skin 3 (three) times daily with meals. 10 mL 11  . insulin glargine (  LANTUS) 100 UNIT/ML injection Inject 0.15 mLs (15 Units total) into the skin at bedtime. 10 mL 11  . ipratropium-albuterol (DUONEB) 0.5-2.5 (3) MG/3ML SOLN Take 3 mLs by nebulization 3 (three) times daily. 360 mL 2  . metFORMIN (GLUCOPHAGE) 500 MG tablet     . midodrine (PROAMATINE) 10 MG tablet Take 1 tablet (10 mg total) by mouth 3 (three) times daily with meals.    . nicotine (NICODERM CQ - DOSED IN MG/24 HOURS) 14 mg/24hr patch Place 1 patch (14 mg total) onto the skin daily as needed (nicotine withdrawal). 28 patch 0  . pantoprazole (PROTONIX) 40 MG tablet Take 40 mg by mouth 2 (two) times daily.     . polyethylene glycol (MIRALAX / GLYCOLAX) packet Take 17 g by mouth daily. 14 each 0  . potassium chloride (K-DUR,KLOR-CON) 10 MEQ tablet Take by mouth.    . predniSONE (STERAPRED UNI-PAK 21 TAB) 10 MG (21) TBPK tablet Start at  taper by  until complete 21 tablet 0  . protein supplement shake (PREMIER PROTEIN) LIQD Take 325 mLs (11 oz total) by mouth 2 (two) times daily between meals. 11 Can 0  . rivaroxaban (XARELTO) 10 MG TABS tablet Take 10 mg by mouth every evening.    Marland Kitchen SPIRIVA HANDIHALER 18 MCG inhalation capsule Place 18 mcg into inhaler and inhale daily.     . tamsulosin (FLOMAX) 0.4 MG CAPS capsule Take 1 capsule (0.4 mg total) by mouth daily. 30 capsule 0  . vitamin C (VITAMIN C) 500 MG tablet Take 1 tablet (500 mg total) by mouth 2 (two) times daily. 60 tablet 0  . ferrous sulfate 325 (65 FE) MG tablet Take 1 tablet (325 mg total) by mouth 2 (two) times daily with a meal. 60 tablet 0   No current facility-administered medications on file prior to visit.     There are no Patient Instructions on file for this visit. No follow-ups on file.   Georgiana Spinner, NP  This note was completed with Office manager.  Any errors are purely unintentional.

## 2018-09-05 ENCOUNTER — Other Ambulatory Visit: Payer: Self-pay

## 2018-09-05 ENCOUNTER — Inpatient Hospital Stay
Admission: EM | Admit: 2018-09-05 | Discharge: 2018-09-13 | DRG: 853 | Disposition: A | Discharge status: Home Health | Payer: Medicare Other | Attending: Internal Medicine | Admitting: Internal Medicine

## 2018-09-05 ENCOUNTER — Encounter: Payer: Self-pay | Admitting: Emergency Medicine

## 2018-09-05 ENCOUNTER — Emergency Department: Payer: Medicare Other

## 2018-09-05 DIAGNOSIS — F17210 Nicotine dependence, cigarettes, uncomplicated: Secondary | ICD-10-CM | POA: Diagnosis not present

## 2018-09-05 DIAGNOSIS — S300XXA Contusion of lower back and pelvis, initial encounter: Secondary | ICD-10-CM | POA: Diagnosis present

## 2018-09-05 DIAGNOSIS — A4102 Sepsis due to Methicillin resistant Staphylococcus aureus: Principal | ICD-10-CM | POA: Diagnosis present

## 2018-09-05 DIAGNOSIS — I9589 Other hypotension: Secondary | ICD-10-CM | POA: Diagnosis present

## 2018-09-05 DIAGNOSIS — Z86718 Personal history of other venous thrombosis and embolism: Secondary | ICD-10-CM

## 2018-09-05 DIAGNOSIS — B9562 Methicillin resistant Staphylococcus aureus infection as the cause of diseases classified elsewhere: Secondary | ICD-10-CM | POA: Diagnosis not present

## 2018-09-05 DIAGNOSIS — R339 Retention of urine, unspecified: Secondary | ICD-10-CM | POA: Diagnosis present

## 2018-09-05 DIAGNOSIS — E43 Unspecified severe protein-calorie malnutrition: Secondary | ICD-10-CM | POA: Diagnosis present

## 2018-09-05 DIAGNOSIS — T8141XA Infection following a procedure, superficial incisional surgical site, initial encounter: Secondary | ICD-10-CM | POA: Diagnosis present

## 2018-09-05 DIAGNOSIS — Z91012 Allergy to eggs: Secondary | ICD-10-CM

## 2018-09-05 DIAGNOSIS — E1140 Type 2 diabetes mellitus with diabetic neuropathy, unspecified: Secondary | ICD-10-CM | POA: Diagnosis present

## 2018-09-05 DIAGNOSIS — Z9049 Acquired absence of other specified parts of digestive tract: Secondary | ICD-10-CM | POA: Diagnosis not present

## 2018-09-05 DIAGNOSIS — L89150 Pressure ulcer of sacral region, unstageable: Secondary | ICD-10-CM

## 2018-09-05 DIAGNOSIS — T8149XA Infection following a procedure, other surgical site, initial encounter: Secondary | ICD-10-CM

## 2018-09-05 DIAGNOSIS — Z808 Family history of malignant neoplasm of other organs or systems: Secondary | ICD-10-CM

## 2018-09-05 DIAGNOSIS — F17200 Nicotine dependence, unspecified, uncomplicated: Secondary | ICD-10-CM | POA: Diagnosis not present

## 2018-09-05 DIAGNOSIS — E878 Other disorders of electrolyte and fluid balance, not elsewhere classified: Secondary | ICD-10-CM | POA: Diagnosis present

## 2018-09-05 DIAGNOSIS — Z95820 Peripheral vascular angioplasty status with implants and grafts: Secondary | ICD-10-CM | POA: Diagnosis not present

## 2018-09-05 DIAGNOSIS — Z89512 Acquired absence of left leg below knee: Secondary | ICD-10-CM | POA: Diagnosis not present

## 2018-09-05 DIAGNOSIS — Z7982 Long term (current) use of aspirin: Secondary | ICD-10-CM

## 2018-09-05 DIAGNOSIS — Z7951 Long term (current) use of inhaled steroids: Secondary | ICD-10-CM

## 2018-09-05 DIAGNOSIS — K219 Gastro-esophageal reflux disease without esophagitis: Secondary | ICD-10-CM | POA: Diagnosis present

## 2018-09-05 DIAGNOSIS — Z79899 Other long term (current) drug therapy: Secondary | ICD-10-CM

## 2018-09-05 DIAGNOSIS — R000 Tachycardia, unspecified: Secondary | ICD-10-CM | POA: Diagnosis not present

## 2018-09-05 DIAGNOSIS — E871 Hypo-osmolality and hyponatremia: Secondary | ICD-10-CM | POA: Diagnosis present

## 2018-09-05 DIAGNOSIS — E1151 Type 2 diabetes mellitus with diabetic peripheral angiopathy without gangrene: Secondary | ICD-10-CM | POA: Diagnosis not present

## 2018-09-05 DIAGNOSIS — L0231 Cutaneous abscess of buttock: Secondary | ICD-10-CM | POA: Diagnosis present

## 2018-09-05 DIAGNOSIS — Z7901 Long term (current) use of anticoagulants: Secondary | ICD-10-CM

## 2018-09-05 DIAGNOSIS — Z1621 Resistance to vancomycin: Secondary | ICD-10-CM | POA: Diagnosis not present

## 2018-09-05 DIAGNOSIS — E1165 Type 2 diabetes mellitus with hyperglycemia: Secondary | ICD-10-CM

## 2018-09-05 DIAGNOSIS — L89152 Pressure ulcer of sacral region, stage 2: Secondary | ICD-10-CM | POA: Diagnosis present

## 2018-09-05 DIAGNOSIS — E785 Hyperlipidemia, unspecified: Secondary | ICD-10-CM | POA: Diagnosis present

## 2018-09-05 DIAGNOSIS — T827XXA Infection and inflammatory reaction due to other cardiac and vascular devices, implants and grafts, initial encounter: Secondary | ICD-10-CM | POA: Diagnosis not present

## 2018-09-05 DIAGNOSIS — L03317 Cellulitis of buttock: Secondary | ICD-10-CM | POA: Diagnosis present

## 2018-09-05 DIAGNOSIS — Z8719 Personal history of other diseases of the digestive system: Secondary | ICD-10-CM

## 2018-09-05 DIAGNOSIS — Z79891 Long term (current) use of opiate analgesic: Secondary | ICD-10-CM

## 2018-09-05 DIAGNOSIS — L089 Local infection of the skin and subcutaneous tissue, unspecified: Secondary | ICD-10-CM | POA: Diagnosis not present

## 2018-09-05 DIAGNOSIS — A419 Sepsis, unspecified organism: Secondary | ICD-10-CM | POA: Diagnosis not present

## 2018-09-05 DIAGNOSIS — Z872 Personal history of diseases of the skin and subcutaneous tissue: Secondary | ICD-10-CM | POA: Diagnosis not present

## 2018-09-05 DIAGNOSIS — E11628 Type 2 diabetes mellitus with other skin complications: Secondary | ICD-10-CM | POA: Diagnosis not present

## 2018-09-05 DIAGNOSIS — R64 Cachexia: Secondary | ICD-10-CM | POA: Diagnosis present

## 2018-09-05 DIAGNOSIS — L89320 Pressure ulcer of left buttock, unstageable: Secondary | ICD-10-CM | POA: Diagnosis not present

## 2018-09-05 DIAGNOSIS — Z7189 Other specified counseling: Secondary | ICD-10-CM | POA: Diagnosis not present

## 2018-09-05 DIAGNOSIS — J449 Chronic obstructive pulmonary disease, unspecified: Secondary | ICD-10-CM | POA: Diagnosis present

## 2018-09-05 DIAGNOSIS — B952 Enterococcus as the cause of diseases classified elsewhere: Secondary | ICD-10-CM | POA: Diagnosis not present

## 2018-09-05 DIAGNOSIS — J9611 Chronic respiratory failure with hypoxia: Secondary | ICD-10-CM | POA: Diagnosis present

## 2018-09-05 DIAGNOSIS — Z681 Body mass index (BMI) 19 or less, adult: Secondary | ICD-10-CM

## 2018-09-05 DIAGNOSIS — Z515 Encounter for palliative care: Secondary | ICD-10-CM | POA: Diagnosis not present

## 2018-09-05 DIAGNOSIS — M79605 Pain in left leg: Secondary | ICD-10-CM | POA: Diagnosis present

## 2018-09-05 DIAGNOSIS — Z794 Long term (current) use of insulin: Secondary | ICD-10-CM

## 2018-09-05 DIAGNOSIS — Z978 Presence of other specified devices: Secondary | ICD-10-CM | POA: Diagnosis not present

## 2018-09-05 LAB — CBC WITH DIFFERENTIAL/PLATELET
ABS IMMATURE GRANULOCYTES: 0.07 10*3/uL (ref 0.00–0.07)
Basophils Absolute: 0.1 10*3/uL (ref 0.0–0.1)
Basophils Relative: 0 %
Eosinophils Absolute: 0.1 10*3/uL (ref 0.0–0.5)
Eosinophils Relative: 0 %
HCT: 42.5 % (ref 36.0–46.0)
HEMOGLOBIN: 13.3 g/dL (ref 12.0–15.0)
Immature Granulocytes: 0 %
Lymphocytes Relative: 5 %
Lymphs Abs: 0.8 10*3/uL (ref 0.7–4.0)
MCH: 30.3 pg (ref 26.0–34.0)
MCHC: 31.3 g/dL (ref 30.0–36.0)
MCV: 96.8 fL (ref 80.0–100.0)
MONOS PCT: 3 %
Monocytes Absolute: 0.6 10*3/uL (ref 0.1–1.0)
Neutro Abs: 16.7 10*3/uL — ABNORMAL HIGH (ref 1.7–7.7)
Neutrophils Relative %: 92 %
Platelets: 302 10*3/uL (ref 150–400)
RBC: 4.39 MIL/uL (ref 3.87–5.11)
RDW: 15.4 % (ref 11.5–15.5)
WBC: 18.4 10*3/uL — ABNORMAL HIGH (ref 4.0–10.5)
nRBC: 0 % (ref 0.0–0.2)

## 2018-09-05 LAB — URINALYSIS, ROUTINE W REFLEX MICROSCOPIC
Bacteria, UA: NONE SEEN
Bilirubin Urine: NEGATIVE
Glucose, UA: >=500 mg/dL — AB
Ketones, ur: NEGATIVE mg/dL
NITRITE: NEGATIVE
PH: 6 (ref 5.0–8.0)
Protein, ur: 30 mg/dL — AB
RBC / HPF: >50 RBC/hpf — ABNORMAL HIGH (ref 0–5)
Specific Gravity, Urine: 1.01 (ref 1.005–1.030)
WBC, UA: >50 WBC/hpf — ABNORMAL HIGH (ref 0–5)

## 2018-09-05 LAB — BASIC METABOLIC PANEL
ANION GAP: 10 (ref 5–15)
BUN: 28 mg/dL — ABNORMAL HIGH (ref 8–23)
CO2: 27 mmol/L (ref 22–32)
Calcium: 7.7 mg/dL — ABNORMAL LOW (ref 8.9–10.3)
Chloride: 89 mmol/L — ABNORMAL LOW (ref 98–111)
Creatinine, Ser: 0.83 mg/dL (ref 0.44–1.00)
GFR calc Af Amer: >60 mL/min (ref >60)
GFR calc non Af Amer: >60 mL/min (ref >60)
Glucose, Bld: 544 mg/dL (ref 70–99)
POTASSIUM: 5 mmol/L (ref 3.5–5.1)
Sodium: 126 mmol/L — ABNORMAL LOW (ref 135–145)

## 2018-09-05 LAB — GLUCOSE, CAPILLARY
Glucose-Capillary: 508 mg/dL (ref 70–99)
Glucose-Capillary: 276 mg/dL — ABNORMAL HIGH (ref 70–99)

## 2018-09-05 LAB — LACTIC ACID, PLASMA
Lactic Acid, Venous: 2.1 mmol/L (ref 0.5–1.9)
Lactic Acid, Venous: 1.6 mmol/L (ref 0.5–1.9)

## 2018-09-05 LAB — TSH: TSH: 0.691 u[IU]/mL (ref 0.350–4.500)

## 2018-09-05 MED ORDER — PREMIER PROTEIN SHAKE
11.0000 [oz_av] | Freq: Two times a day (BID) | ORAL | Status: DC
  Administered 2018-09-06: 11 [oz_av] via ORAL

## 2018-09-05 MED ORDER — ATORVASTATIN CALCIUM 20 MG PO TABS
40.0000 mg | ORAL_TABLET | Freq: Every day | ORAL | Status: DC
  Administered 2018-09-05 – 2018-09-12 (×8): 40 mg via ORAL
  Filled 2018-09-05 (×8): qty 2

## 2018-09-05 MED ORDER — POLYETHYLENE GLYCOL 3350 17 G PO PACK: 17.0000 g | PACK | Freq: Every day | ORAL | Status: DC | PRN

## 2018-09-05 MED ORDER — MORPHINE SULFATE (PF) 2 MG/ML IV SOLN
2.0000 mg | Freq: Once | INTRAVENOUS | Status: AC
  Administered 2018-09-05: 2 mg via INTRAVENOUS
  Filled 2018-09-05: qty 1

## 2018-09-05 MED ORDER — COLLAGENASE 250 UNIT/GM EX OINT
TOPICAL_OINTMENT | Freq: Every day | CUTANEOUS | Status: DC
  Administered 2018-09-06 – 2018-09-12 (×6): via TOPICAL
  Filled 2018-09-05: qty 30

## 2018-09-05 MED ORDER — TRAMADOL HCL 50 MG PO TABS
50.0000 mg | ORAL_TABLET | Freq: Four times a day (QID) | ORAL | Status: DC | PRN
  Administered 2018-09-05 – 2018-09-10 (×10): 50 mg via ORAL
  Filled 2018-09-05 (×10): qty 1

## 2018-09-05 MED ORDER — SODIUM CHLORIDE 0.9 % IV BOLUS (SEPSIS)
1000.0000 mL | Freq: Once | INTRAVENOUS | Status: AC
  Administered 2018-09-05: 1000 mL via INTRAVENOUS

## 2018-09-05 MED ORDER — METRONIDAZOLE IN NACL 5-0.79 MG/ML-% IV SOLN
500.0000 mg | Freq: Three times a day (TID) | INTRAVENOUS | Status: DC
  Administered 2018-09-05 – 2018-09-08 (×8): 500 mg via INTRAVENOUS
  Filled 2018-09-05 (×10): qty 100

## 2018-09-05 MED ORDER — ALBUTEROL SULFATE (2.5 MG/3ML) 0.083% IN NEBU: 2.5000 mg | INHALATION_SOLUTION | Freq: Four times a day (QID) | RESPIRATORY_TRACT | Status: DC | PRN

## 2018-09-05 MED ORDER — FERROUS SULFATE 325 (65 FE) MG PO TABS
325.0000 mg | ORAL_TABLET | Freq: Two times a day (BID) | ORAL | Status: DC
  Administered 2018-09-06 – 2018-09-13 (×12): 325 mg via ORAL
  Filled 2018-09-05 (×12): qty 1

## 2018-09-05 MED ORDER — MIDODRINE HCL 5 MG PO TABS
10.0000 mg | ORAL_TABLET | Freq: Three times a day (TID) | ORAL | Status: DC
  Administered 2018-09-06 – 2018-09-13 (×20): 10 mg via ORAL
  Filled 2018-09-05 (×24): qty 2

## 2018-09-05 MED ORDER — SODIUM CHLORIDE 0.9 % IV SOLN
2.0000 g | INTRAVENOUS | Status: DC
  Administered 2018-09-05 – 2018-09-06 (×2): 2 g via INTRAVENOUS
  Filled 2018-09-05 (×3): qty 2

## 2018-09-05 MED ORDER — SODIUM CHLORIDE 0.9 % IV SOLN
INTRAVENOUS | Status: DC | PRN
  Administered 2018-09-05 – 2018-09-06 (×3): 250 mL via INTRAVENOUS
  Administered 2018-09-07: 13:00:00 via INTRAVENOUS
  Administered 2018-09-08: 250 mL via INTRAVENOUS

## 2018-09-05 MED ORDER — PIPERACILLIN-TAZOBACTAM 3.375 G IVPB 30 MIN
3.3750 g | Freq: Once | INTRAVENOUS | Status: AC
  Administered 2018-09-05: 3.375 g via INTRAVENOUS
  Filled 2018-09-05: qty 50

## 2018-09-05 MED ORDER — VITAMIN C 500 MG PO TABS
500.0000 mg | ORAL_TABLET | Freq: Two times a day (BID) | ORAL | Status: DC
  Administered 2018-09-06 – 2018-09-13 (×13): 500 mg via ORAL
  Filled 2018-09-05 (×13): qty 1

## 2018-09-05 MED ORDER — VANCOMYCIN HCL IN DEXTROSE 1-5 GM/200ML-% IV SOLN: 1000.0000 mg | Freq: Once | INTRAVENOUS | Status: DC

## 2018-09-05 MED ORDER — IOHEXOL 300 MG/ML  SOLN
100.0000 mL | Freq: Once | INTRAMUSCULAR | Status: AC | PRN
  Administered 2018-09-05: 75 mL via INTRAVENOUS

## 2018-09-05 MED ORDER — ALPRAZOLAM 0.5 MG PO TABS
0.5000 mg | ORAL_TABLET | Freq: Two times a day (BID) | ORAL | Status: DC | PRN
  Administered 2018-09-06 – 2018-09-10 (×3): 0.5 mg via ORAL
  Filled 2018-09-05 (×2): qty 2
  Filled 2018-09-05 (×2): qty 1

## 2018-09-05 MED ORDER — ONDANSETRON HCL 4 MG PO TABS: 4.0000 mg | ORAL_TABLET | Freq: Four times a day (QID) | ORAL | Status: DC | PRN

## 2018-09-05 MED ORDER — INSULIN ASPART 100 UNIT/ML ~~LOC~~ SOLN: 3.0000 [IU] | Freq: Three times a day (TID) | SUBCUTANEOUS | Status: DC

## 2018-09-05 MED ORDER — ONDANSETRON HCL 4 MG/2ML IJ SOLN
4.0000 mg | Freq: Once | INTRAMUSCULAR | Status: AC
  Administered 2018-09-05: 4 mg via INTRAVENOUS
  Filled 2018-09-05: qty 2

## 2018-09-05 MED ORDER — SODIUM CHLORIDE 0.9 % IV BOLUS (SEPSIS)
250.0000 mL | Freq: Once | INTRAVENOUS | Status: AC
  Administered 2018-09-05: 250 mL via INTRAVENOUS

## 2018-09-05 MED ORDER — ACETAMINOPHEN 650 MG RE SUPP: 650.0000 mg | Freq: Four times a day (QID) | RECTAL | Status: DC | PRN

## 2018-09-05 MED ORDER — SODIUM CHLORIDE 0.9 % IV BOLUS
1000.0000 mL | Freq: Once | INTRAVENOUS | Status: AC
  Administered 2018-09-05: 1000 mL via INTRAVENOUS

## 2018-09-05 MED ORDER — TAMSULOSIN HCL 0.4 MG PO CAPS
0.4000 mg | ORAL_CAPSULE | Freq: Every day | ORAL | Status: DC
  Administered 2018-09-06 – 2018-09-13 (×6): 0.4 mg via ORAL
  Filled 2018-09-05 (×6): qty 1

## 2018-09-05 MED ORDER — PANTOPRAZOLE SODIUM 40 MG PO TBEC
40.0000 mg | DELAYED_RELEASE_TABLET | Freq: Two times a day (BID) | ORAL | Status: DC
  Administered 2018-09-06 (×2): 40 mg via ORAL
  Filled 2018-09-05 (×2): qty 1

## 2018-09-05 MED ORDER — IPRATROPIUM-ALBUTEROL 0.5-2.5 (3) MG/3ML IN SOLN
3.0000 mL | Freq: Three times a day (TID) | RESPIRATORY_TRACT | Status: DC
  Administered 2018-09-06 (×2): 3 mL via RESPIRATORY_TRACT
  Filled 2018-09-05 (×2): qty 3

## 2018-09-05 MED ORDER — HYDROCODONE-ACETAMINOPHEN 5-325 MG PO TABS
1.0000 | ORAL_TABLET | ORAL | Status: DC | PRN
  Administered 2018-09-05: 2 via ORAL
  Administered 2018-09-06: 1 via ORAL
  Administered 2018-09-06 – 2018-09-07 (×2): 2 via ORAL
  Administered 2018-09-07 (×2): 1 via ORAL
  Filled 2018-09-05 (×2): qty 1
  Filled 2018-09-05 (×3): qty 2
  Filled 2018-09-05 (×2): qty 1

## 2018-09-05 MED ORDER — ASPIRIN 81 MG PO CHEW
81.0000 mg | CHEWABLE_TABLET | Freq: Every day | ORAL | Status: DC
  Administered 2018-09-06 – 2018-09-13 (×6): 81 mg via ORAL
  Filled 2018-09-05 (×6): qty 1

## 2018-09-05 MED ORDER — ONDANSETRON HCL 4 MG/2ML IJ SOLN: 4.0000 mg | Freq: Four times a day (QID) | INTRAMUSCULAR | Status: DC | PRN

## 2018-09-05 MED ORDER — SODIUM CHLORIDE 0.9 % IV SOLN: 2.0000 g | Freq: Once | INTRAVENOUS | Status: DC

## 2018-09-05 MED ORDER — GABAPENTIN 100 MG PO CAPS
100.0000 mg | ORAL_CAPSULE | Freq: Every day | ORAL | Status: DC
  Administered 2018-09-05 – 2018-09-12 (×8): 100 mg via ORAL
  Filled 2018-09-05 (×8): qty 1

## 2018-09-05 MED ORDER — IPRATROPIUM-ALBUTEROL 0.5-2.5 (3) MG/3ML IN SOLN: 3.0000 mL | Freq: Three times a day (TID) | RESPIRATORY_TRACT | Status: DC

## 2018-09-05 MED ORDER — ACETAMINOPHEN 325 MG PO TABS: 650.0000 mg | ORAL_TABLET | Freq: Four times a day (QID) | ORAL | Status: DC | PRN

## 2018-09-05 MED ORDER — VANCOMYCIN HCL IN DEXTROSE 1-5 GM/200ML-% IV SOLN
1000.0000 mg | Freq: Once | INTRAVENOUS | Status: AC
  Administered 2018-09-05: 1000 mg via INTRAVENOUS
  Filled 2018-09-05: qty 200

## 2018-09-05 MED ORDER — VANCOMYCIN HCL 500 MG IV SOLR
500.0000 mg | INTRAVENOUS | Status: DC
  Administered 2018-09-06: 500 mg via INTRAVENOUS
  Filled 2018-09-05 (×3): qty 500

## 2018-09-05 MED ORDER — MOMETASONE FURO-FORMOTEROL FUM 200-5 MCG/ACT IN AERO
2.0000 | INHALATION_SPRAY | Freq: Two times a day (BID) | RESPIRATORY_TRACT | Status: DC
  Administered 2018-09-06 – 2018-09-13 (×14): 2 via RESPIRATORY_TRACT
  Filled 2018-09-05: qty 8.8

## 2018-09-05 MED ORDER — INSULIN GLARGINE 100 UNIT/ML ~~LOC~~ SOLN
15.0000 [IU] | Freq: Every day | SUBCUTANEOUS | Status: DC
  Administered 2018-09-05 – 2018-09-12 (×5): 15 [IU] via SUBCUTANEOUS
  Filled 2018-09-05 (×9): qty 0.15

## 2018-09-05 MED ORDER — DOCUSATE SODIUM 100 MG PO CAPS
100.0000 mg | ORAL_CAPSULE | Freq: Two times a day (BID) | ORAL | Status: DC
  Administered 2018-09-05 – 2018-09-06 (×3): 100 mg via ORAL
  Filled 2018-09-05 (×3): qty 1

## 2018-09-05 MED ORDER — INSULIN ASPART 100 UNIT/ML ~~LOC~~ SOLN
10.0000 [IU] | Freq: Once | SUBCUTANEOUS | Status: AC
  Administered 2018-09-05: 10 [IU] via INTRAVENOUS
  Filled 2018-09-05: qty 1

## 2018-09-05 MED ORDER — POLYETHYLENE GLYCOL 3350 17 G PO PACK
17.0000 g | PACK | Freq: Every day | ORAL | Status: DC
  Administered 2018-09-06 – 2018-09-13 (×6): 17 g via ORAL
  Filled 2018-09-05 (×6): qty 1

## 2018-09-05 NOTE — ED Notes (Signed)
EDP Veronese notified in person of BG in 55s.

## 2018-09-05 NOTE — ED Notes (Signed)
Pt has wound to L groin; pus/redness/warmth. Wound at sacrum open as well; red/purple/yellow.

## 2018-09-05 NOTE — ED Notes (Signed)
CT called about BMP. CT states verb okay from EDP Veronese to go ahead with CT based on previous labs since this BMP not back yet.

## 2018-09-05 NOTE — ED Notes (Signed)
Attempted report 

## 2018-09-05 NOTE — H&P (Signed)
[facility] [place] [facility]   PATIENT NAME: April [name]    MR#:  [id number]  DATE OF BIRTH:  27-May-1956  DATE OF ADMISSION:  09/05/2018  PRIMARY CARE PHYSICIAN: Mickel Fuchs, MD   REQUESTING/REFERRING PHYSICIAN:   CHIEF COMPLAINT:   Chief Complaint  Patient presents with  . Post-op Problem    HISTORY OF PRESENT ILLNESS: April [name]  is a 63 y.o. female with a known history history per below which includes left BKA, status post endarterectomy, presented to the emergency room left leg pain, drainage from left femoral wound from previous femoral endarterectomy site, work-up in the emergency room noted for gluteal abscess and possible left femoral abscess, ED attending did discuss case with Dr. Lady Gary general surgery as well as vascular surgery/Dr. dew, recommended admission for sepsis to the hospitalist service prior to any surgical intervention being done, patient evaluated in the emergency room noted to be in moderate distress secondary to left lower extremity pain, no family present at the bedside, patient wishes to be made full code, patient is now being admitted for acute sepsis secondary to acute left gluteal abscess, left infected draining femoral wound status post previous endarterectomy.   PAST MEDICAL HISTORY:   Past Medical History:  Diagnosis Date  . Anxiety   . Colon polyps   . COPD (chronic obstructive pulmonary disease) (HCC)   . Diabetes mellitus without complication (HCC)   . DVT (deep venous thrombosis) (HCC)   . DVT (deep venous thrombosis) (HCC)   . History of colonic polyps   . Hyperlipidemia   . Peripheral artery disease (HCC)   . Pulmonary nodules/lesions, multiple 08/2012   NEGATIVE PET SCAN  . Retroperitoneal abscess (HCC)    history of in 04/2013  . Tobacco abuse     PAST SURGICAL HISTORY:  Past Surgical History:  Procedure Laterality Date  . AMPUTATION Left 07/24/2018   Procedure: AMPUTATION BELOW KNEE;  Surgeon: Annice Needy, MD;  Location: [facility] ORS;  Service: Vascular;  Laterality: Left;  . APPLICATION OF WOUND VAC Right 07/11/2018   Procedure: APPLICATION OF WOUND VAC;  Surgeon: Gwyneth Revels, DPM;  Location: [facility] OR[facility];  Service: Podiatry;  Laterality: Right;  . CHOLECYSTECTOMY    . COLONOSCOPY  05/2007  . ENDARTERECTOMY FEMORAL Bilateral 07/17/2018   Procedure: ENDARTERECTOMY FEMORAL - BILATERAL SFA STENT PLACEMENT - LEFT;  Surgeon: Annice Needy, MD;  Location: [facility] ORS;  Service: Vascular;  Laterality: Bilateral;  . IRRIGATION AND DEBRIDEMENT FOOT Bilateral 05/25/2018   Procedure: IRRIGATION AND DEBRIDEMENT FOOT application wound vac left  foot;  Surgeon: Recardo Evangelist, DPM;  Location: [facility] OR[facility];  Service: Podiatry;  Laterality: Bilateral;  . IRRIGATION AND DEBRIDEMENT FOOT Right 07/11/2018   Procedure: FOOT DEBRIDEMENT;  Surgeon: Gwyneth Revels, DPM;  Location: [facility] OR[facility];  Service: Podiatry;  Laterality: Right;  . LOWER EXTREMITY ANGIOGRAPHY Left 05/26/2018   Procedure: Lower Extremity Angiography with possible intervention;  Surgeon: Annice Needy, MD;  Location: [facility] INVASIVE CV LAB;  Service: Cardiovascular;  Laterality: Left;  . LOWER EXTREMITY ANGIOGRAPHY Right 05/27/2018   Procedure: Lower Extremity Angiography;  Surgeon: Renford Dills, MD;  Location: [facility] INVASIVE CV LAB;  Service: Cardiovascular;  Laterality: Right;  . LOWER EXTREMITY ANGIOGRAPHY Right 07/15/2018   Procedure: Lower Extremity Angiography;  Surgeon: Renford Dills, MD;  Location: [facility] INVASIVE CV LAB;  Service: Cardiovascular;  Laterality: Right;    SOCIAL HISTORY:  Social History   Tobacco Use  . Smoking status: Current  Every Day Smoker    Packs/day: 1.50    Years: 47.00    Pack years: 70.50    Types: Cigarettes  . Smokeless tobacco: Never Used  . Tobacco comment: Smoking History 2.5-started at age 72  Substance Use Topics  . Alcohol use: No    Alcohol/week: 0.0 standard drinks    FAMILY HISTORY:  Family  History  Problem Relation Age of Onset  . Brain cancer Mother     DRUG ALLERGIES:  Allergies  Allergen Reactions  . Eggs Or Egg-Derived Products Diarrhea, Itching and Nausea And Vomiting    REVIEW OF SYSTEMS:   CONSTITUTIONAL: No fever, fatigue or weakness.  EYES: No blurred or double vision.  EARS, NOSE, AND THROAT: No tinnitus or ear pain.  RESPIRATORY: No cough, shortness of breath, wheezing or hemoptysis.  CARDIOVASCULAR: No chest pain, orthopnea, edema.  GASTROINTESTINAL: No nausea, vomiting, diarrhea or abdominal pain.  GENITOURINARY: No dysuria, hematuria.  ENDOCRINE: No polyuria, nocturia,  HEMATOLOGY: No anemia, easy bruising or bleeding SKIN: Draining left thigh wound MUSCULOSKELETAL: Severe left leg/groin pain  nEUROLOGIC: No tingling, numbness, weakness.  PSYCHIATRY: No anxiety or depression.   MEDICATIONS AT HOME:  Prior to Admission medications   Medication Sig Start Date End Date Taking? Authorizing Provider  ALPRAZolam Prudy Feeler) 0.5 MG tablet Take 1 tablet (0.5 mg total) by mouth 2 (two) times daily as needed for anxiety. 07/26/18  Yes Adrian Saran, MD  aspirin 81 MG chewable tablet Chew 81 mg by mouth daily. 05/30/17  Yes [provider]  atorvastatin (LIPITOR) 40 MG tablet Take 40 mg by mouth daily. 10/24/17 10/24/18 Yes [provider]  Fluticasone-Salmeterol (ADVAIR DISKUS) 250-50 MCG/DOSE AEPB Inhale 1 puff into the lungs 2 (two) times daily. 03/14/15 01/16/26 Yes Beers, Charmayne Sheer, PA-C  gabapentin (NEURONTIN) 100 MG capsule Take 1 capsule (100 mg total) by mouth at bedtime. 07/26/18  Yes Mody, Patricia Pesa, MD  insulin aspart (NOVOLOG) 100 UNIT/ML injection Inject 3 Units into the skin 3 (three) times daily with meals. 07/26/18  Yes Mody, Patricia Pesa, MD  insulin glargine (LANTUS) 100 UNIT/ML injection Inject 0.15 mLs (15 Units total) into the skin at bedtime. 07/26/18  Yes Adrian Saran, MD  metFORMIN (GLUCOPHAGE) 500 MG tablet Take 500 mg by mouth daily with  breakfast.  06/13/18  Yes [provider]  pantoprazole (PROTONIX) 40 MG tablet Take 40 mg by mouth 2 (two) times daily.  04/25/18  Yes [provider]  potassium chloride (K-DUR,KLOR-CON) 10 MEQ tablet Take by mouth. 06/13/18  Yes [provider]  rivaroxaban (XARELTO) 10 MG TABS tablet Take 10 mg by mouth every evening.   Yes [provider]  SPIRIVA HANDIHALER 18 MCG inhalation capsule Place 18 mcg into inhaler and inhale daily.  05/19/18  Yes [provider]  tamsulosin (FLOMAX) 0.4 MG CAPS capsule Take 1 capsule (0.4 mg total) by mouth daily. 06/14/18  Yes Altamese Dilling, MD  traMADol (ULTRAM) 50 MG tablet Take 1 tablet (50 mg total) by mouth every 6 (six) hours as needed for up to 5 days for moderate pain. 09/02/18 09/07/18 Yes Georgiana Spinner, NP  vitamin C (VITAMIN C) 500 MG tablet Take 1 tablet (500 mg total) by mouth 2 (two) times daily. 06/13/18  Yes Altamese Dilling, MD  albuterol (PROVENTIL HFA;VENTOLIN HFA) 108 (90 Base) MCG/ACT inhaler Inhale 2 puffs into the lungs every 6 (six) hours as needed for wheezing or shortness of breath.    [provider]  albuterol (PROVENTIL) (2.5 MG/3ML)  0.083% nebulizer solution Inhale 2.5 mg into the lungs every 6 (six) hours as needed for wheezing or shortness of breath.     [provider]  collagenase (SANTYL) ointment Apply topically daily. 08/25/18   Auburn Bilberry, MD  ferrous sulfate 325 (65 FE) MG tablet Take 1 tablet (325 mg total) by mouth 2 (two) times daily with a meal. 06/01/18 07/01/18  Ihor Austin, MD  furosemide (LASIX) 40 MG tablet Take 1 tablet (40 mg total) by mouth daily. 06/14/18   Altamese Dilling, MD  guaiFENesin-dextromethorphan (ROBITUSSIN DM) 100-10 MG/5ML syrup Take 5 mLs by mouth every 4 (four) hours as needed for cough. 04/11/18   Sudini, Wardell Heath, MD  ipratropium-albuterol (DUONEB) 0.5-2.5 (3) MG/3ML SOLN Take 3 mLs by nebulization 3 (three) times  daily. 08/25/18   Auburn Bilberry, MD  midodrine (PROAMATINE) 10 MG tablet Take 1 tablet (10 mg total) by mouth 3 (three) times daily with meals. 07/26/18   Adrian Saran, MD  polyethylene glycol (MIRALAX / GLYCOLAX) packet Take 17 g by mouth daily. 06/14/18   Altamese Dilling, MD  protein supplement shake (PREMIER PROTEIN) LIQD Take 325 mLs (11 oz total) by mouth 2 (two) times daily between meals. 06/14/18   Altamese Dilling, MD      PHYSICAL EXAMINATION:   VITAL SIGNS: Blood pressure 105/62, pulse 99, temperature 97.7 F (36.5 C), temperature source Oral, resp. rate 12, height  (1.575 m), weight 39 kg, SpO2 100 %.  GENERAL:  63 y.o.-year-old patient lying in the bed with no acute distress.  Emaciated appearance EYES: Pupils equal, round, reactive to light and accommodation. No scleral icterus. Extraocular muscles intact.  HEENT: Head atraumatic, normocephalic. Oropharynx and nasopharynx clear.  NECK:  Supple, no jugular venous distention. No thyroid enlargement, no tenderness.  LUNGS: Normal breath sounds bilaterally, no wheezing, rales,rhonchi or crepitation. No use of accessory muscles of respiration.  CARDIOVASCULAR: S1, S2 normal. No murmurs, rubs, or gallops.  ABDOMEN: Soft, nontender, nondistended. Bowel sounds present. No organomegaly or mass.  EXTREMITIES: No pedal edema, cyanosis, or clubbing.  Left gluteal fluctuance/tender to palpation NEUROLOGIC: Cranial nerves II through XII are intact. Muscle strength 5/5 in all extremities. Sensation intact. Gait not checked.  PSYCHIATRIC: The patient is alert and oriented x 3.  SKIN: Draining anterior femoral wound  LABORATORY PANEL:   CBC Recent Labs  Lab 09/05/18 1141  WBC 18.4*  HGB 13.3  HCT 42.5  PLT 302  MCV 96.8  MCH 30.3  MCHC 31.3  RDW 15.4  LYMPHSABS 0.8  MONOABS 0.6  EOSABS 0.1  BASOSABS 0.1    ------------------------------------------------------------------------------------------------------------------  Chemistries  Recent Labs  Lab 09/05/18 1426  NA 126*  K 5.0  CL 89*  CO2 27  GLUCOSE 544*  BUN 28*  CREATININE 0.83  CALCIUM 7.7*   ------------------------------------------------------------------------------------------------------------------ estimated creatinine clearance is 42.7 mL/min (by C-G formula based on SCr of 0.83 mg/dL). ------------------------------------------------------------------------------------------------------------------ No results for input(s): TSH, T4TOTAL, T3FREE, THYROIDAB in the last 72 hours.  Invalid input(s): FREET3   Coagulation profile No results for input(s): INR, PROTIME in the last 168 hours. ------------------------------------------------------------------------------------------------------------------- No results for input(s): DDIMER in the last 72 hours. -------------------------------------------------------------------------------------------------------------------  Cardiac Enzymes No results for input(s): CKMB, TROPONINI, MYOGLOBIN in the last 168 hours.  Invalid input(s): CK ------------------------------------------------------------------------------------------------------------------ Invalid input(s): POCBNP  ---------------------------------------------------------------------------------------------------------------  Urinalysis    Component Value Date/Time   COLORURINE YELLOW (A) 08/21/2018 1410   APPEARANCEUR CLOUDY (A) 08/21/2018 1410   APPEARANCEUR Clear 10/13/2014 1412   LABSPEC 1.018 08/21/2018 1410  LABSPEC 1.031 10/13/2014 1412   PHURINE 5.0 08/21/2018 1410   GLUCOSEU >=500 (A) 08/21/2018 1410   GLUCOSEU >=500 10/13/2014 1412   HGBUR MODERATE (A) 08/21/2018 1410   BILIRUBINUR NEGATIVE 08/21/2018 1410   BILIRUBINUR Negative 10/13/2014 1412   KETONESUR NEGATIVE 08/21/2018 1410    PROTEINUR 30 (A) 08/21/2018 1410   NITRITE NEGATIVE 08/21/2018 1410   LEUKOCYTESUR LARGE (A) 08/21/2018 1410   LEUKOCYTESUR 1+ 10/13/2014 1412     RADIOLOGY: Ct Pelvis W Contrast  Result Date: 09/05/2018 CLINICAL DATA:  Here with ex-boyfriend. Pt has had vascular surgery with balloons to legs. Wound opened. Recent amputation. Pt is diabetic. He has taken her back to vascular center 2 days ago for wound that he is concerned about. Was placed on a second round of antibiotics. Groin wound is swollen per family. Draining yellow fluid per family. No fever. Per family, infection under the skin of the buttocks. Severe pain in this region. EXAM: CT PELVIS WITH CONTRAST TECHNIQUE: Multidetector CT imaging of the pelvis was performed using the standard protocol following the bolus administration of intravenous contrast. CONTRAST:  75mL OMNIPAQUE IOHEXOL 300 MG/ML  SOLN COMPARISON:  06/07/2018 CT abdomen and pelvis FINDINGS: Urinary Tract: Distended urinary bladder. LOWER portions of the kidneys are unremarkable. Bowel: Moderate stool burden. No bowel dilatation or bowel wall thickening. Vascular/Lymphatic: Aorto bi-iliac stent grafts. Bilateral femoral artery stents. There is dense atherosclerosis of the native vessels. Along the LEFT proximal femoral artery there is an irregular heterogeneous gas and fluid collection with rim enhancement which measures 1.7 x 7.8 x 2.5 centimeters. Small amount of air is identified within the wound at the skin, consistent with the history of draining wound. There is no evidence for pseudoaneurysm or arteriovenous fistula. Reproductive:  The uterus is present.  No adnexal mass. Other:  No ascites.  Anterior abdominal wall is unremarkable. The LEFT gluteus maximus is enlarged and heterogeneous, measuring 5.4 centimeters, compared to 1.8 centimeters on the RIGHT. Vessels traverse the muscle and considerations include inflammation/infection, or resolving hematoma. Mass is significantly  less likely given the normal appearance on the December scan. Given the heterogeneity, is difficult to determine if there is a drainable fluid collection in this region. Significant diffuse body wall edema. Musculoskeletal: No acute osseous abnormality. IMPRESSION: 1. Heterogeneous gas and fluid collection adjacent to the LEFT femoral artery, suspicious for abscess. 2. Heterogeneous enlargement of the LEFT gluteus maximus, consistent with abscess, inflammation, or resolving hematoma. Given the heterogeneity, infection/inflammation is favored. Consider ultrasound to evaluate for drainable fluid collections. 3. Distended urinary bladder. 4. Vascular stents. Electronically Signed   By: Norva Pavlov M.D.   On: 09/05/2018 15:25    EKG: Orders placed or performed during the hospital encounter of 09/05/18  . ED EKG 12-Lead  . ED EKG 12-Lead    IMPRESSION AND PLAN: *Acute sepsis Secondary to acute infected draining femoral leg wound and possible gluteal abscess Admit to regular nursing for bed on our sepsis protocol, empiric cefepime/vancomycin/Flagyl, follow-up on cultures, hold Xarelto  *Acute draining infected femoral wound, possible left gluteal abscess General surgery/Dr. Lady Gary consulted for I&D evaluation Vascular surgery/Dr. dew consulted given history of recent left endarterectomy  *Chronic malnutrition Dietary consulted  *Acute hyponatremia/hypochloremia Secondary to poor p.o. intake, diuretic use Hold Lasix, IV fluids for rehydration, BMP in the morning  *COPD without exacerbation Breathing treatments PRN  *Chronic hypoxic respiratory failure Stable On 2-3 L via nasal cannula continuous at home  *History of DVT Xarelto on hold given possible need  for I&D  *Chronic diabetes mellitus type 2 Sliding scale insulin with Accu-Cheks per routine   All the records are reviewed and case discussed with ED provider. Management plans discussed with the patient, family and they are  in agreement.  CODE STATUS:full Code Status History    Date Active Date Inactive Code Status Order ID Comments User Context   08/21/2018 1658 08/25/2018 1932 DNR [id number]  Adrian Saran, MD ED   08/21/2018 1656 08/21/2018 1658 DNR [id number]  Adrian Saran, MD ED   07/21/2018 1302 07/26/2018 2148 DNR [id number]  Glee Arvin, NP Inpatient   07/09/2018 1730 07/21/2018 1302 Partial Code [id number]  Mayo, Allyn Kenner, MD Inpatient   06/03/2018 0621 06/13/2018 2013 Full Code [id number]  Arnaldo Natal, MD Inpatient   05/23/2018 0253 06/01/2018 2158 Full Code [id number]  Cammy Copa, MD Inpatient   05/23/2018 0138 05/23/2018 0253 DNR [id number]  Cammy Copa, MD ED   04/10/2018 1111 04/11/2018 1554 DNR [id number]  Milagros Loll, MD Inpatient   04/09/2018 1519 04/10/2018 1111 Full Code [id number]  Milagros Loll, MD Inpatient   04/09/2018 1337 04/09/2018 1519 DNR [id number]  Milagros Loll, MD Inpatient   04/09/2018 0012 04/09/2018 1337 Full Code [id number]  Oralia Manis, MD ED    Questions for Most Recent Historical Code Status (Order [id number])    Question Answer Comment   In the event of cardiac or respiratory ARREST Do not call a "code blue"    In the event of cardiac or respiratory ARREST Do not perform Intubation, CPR, defibrillation or ACLS    In the event of cardiac or respiratory ARREST Use medication by any route, position, wound care, and other measures to relive pain and suffering. May use oxygen, suction and manual treatment of airway obstruction as needed for comfort.        TOTAL TIME TAKING CARE OF THIS PATIENT: 40 minutes.    Evelena Asa Salary M.D on 09/05/2018   Between 7am to 6pm - Pager - [phone]  After 6pm go to [email or web address] - password [email or web address]  [facility]  Office  [phone]  CC: Primary care physician; Mickel Fuchs, MD   Note: This dictation was prepared with Dragon dictation along with smaller phrase technology. Any transcriptional errors  that result from this process are unintentional.

## 2018-09-05 NOTE — ED Notes (Signed)
Pt couldn't make it onto bedpan before having large urinary incontinence episode. Peri care provided. Pads changed; bottoms applied. Bladder scan completed: pt still has >999 in bladder.

## 2018-09-05 NOTE — ED Notes (Signed)
2nd lactic collected 

## 2018-09-05 NOTE — Progress Notes (Signed)
Pharmacy Antibiotic Note  April [name] is a 63 y.o. female admitted on 09/05/2018 with Unknown Source.  Pharmacy has been consulted for cefepime and vancomycin dosing.  Plan: Patient received vancomycin 1000 mg in the ED x 1. Will start a maintenance dose of vancomycin 500 mg q24h. Plan to order level on the 4-5 days of therapy.   Vancomycin 500 mg IV Q 24 hrs. Goal AUC 400-550. Expected AUC: 446 SCr used: 0.83  Will start cefepime 2 g q24H.    Height: 5\' 2"  (157.5 cm) Weight: 86 lb (39 kg) IBW/kg (Calculated) : 50.1  Temp (24hrs), Avg:97.7 F (36.5 C), Min:97.7 F (36.5 C), Max:97.7 F (36.5 C)  Recent Labs  Lab 09/05/18 1141 09/05/18 1225 09/05/18 1426 09/05/18 1549  WBC 18.4*  --   --   --   CREATININE  --   --  0.83  --   LATICACIDVEN  --  2.1*  --  1.6    Estimated Creatinine Clearance: 42.7 mL/min (by C-G formula based on SCr of 0.83 mg/dL).    Allergies  Allergen Reactions  . Eggs Or Egg-Derived Products Diarrhea, Itching and Nausea And Vomiting    Antimicrobials this admission: 3/6 pip/tazo x 1 3/6 cefepime >>  3/6 vancomycin >>   Dose adjustments this admission: None  Microbiology results: 3/6 BCx: pending  Thank you for allowing pharmacy to be a part of this patient's care.  Ronnald Ramp, PharmD, BCPS  Clinical Pharmacist  09/05/2018 4:30 PM

## 2018-09-05 NOTE — ED Notes (Signed)
Pt given water 

## 2018-09-05 NOTE — ED Notes (Signed)
Attempted to place foley cath with Maralyn Sago NT. Will have another RN try.

## 2018-09-05 NOTE — Consult Note (Addendum)
[facility] SURGICAL CONSULTATION NOTE (initial) - cpt: 16109 (Outpatient/ED)   HISTORY OF PRESENT ILLNESS (HPI):  63 y.o. female presented to [facility] ED today for evaluation of draining post-operative wounds and a sacral ulceration. Majority of history obtained through chart review, due to patient debility. She underwent bilateral femoral endarterectomies on 07/17/2018 with Dr Wyn Quaker. 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 She was followed up 2 weeks post-operatively (02/13) in the vascular clinic and there was concern for possible SSI and was started on Keflex for 1 week. This course of antibiotics was not completed due to an admission for COPD exacerbation on 02/20-02/24. She was followed up in vascular clinic again on 03/03 and restarted on 2 weeks of Keflex for continued concerns over wound infection. The PA noted the sacral sore, and noted that the antibiotics for the SSI should cover any infectious process.  She presents to the ED today for the same. She reports continued drainage from her left groin as well as from a new sacral wound. No reports of fever, chills. Patient appears very debilitated and can not contribute much else to history.    Surgery is consulted by emergency medicine physician Dr. Nita Sickle, MD in this context for evaluation and management of sacral wound.   PAST MEDICAL HISTORY (PMH):  Past Medical History:  Diagnosis Date  . Anxiety   . Colon polyps   . COPD (chronic obstructive pulmonary disease) (HCC)   . Diabetes mellitus without complication (HCC)   . DVT (deep venous thrombosis) (HCC)   . DVT (deep venous thrombosis) (HCC)   . History of colonic polyps   . Hyperlipidemia   . Peripheral artery disease (HCC)   . Pulmonary nodules/lesions, multiple 08/2012   NEGATIVE PET SCAN  . Retroperitoneal abscess (HCC)    history of in 04/2013  . Tobacco abuse      PAST SURGICAL HISTORY (PSH):  Past  Surgical History:  Procedure Laterality Date  . AMPUTATION Left 07/24/2018   Procedure: AMPUTATION BELOW KNEE;  Surgeon: Annice Needy, MD;  Location: [facility] ORS;  Service: Vascular;  Laterality: Left;  . APPLICATION OF WOUND VAC Right 07/11/2018   Procedure: APPLICATION OF WOUND VAC;  Surgeon: Gwyneth Revels, DPM;  Location: [facility] ORS;  Service: Podiatry;  Laterality: Right;  . CHOLECYSTECTOMY    . COLONOSCOPY  05/2007  . ENDARTERECTOMY FEMORAL Bilateral 07/17/2018   Procedure: ENDARTERECTOMY FEMORAL - BILATERAL SFA STENT PLACEMENT - LEFT;  Surgeon: Annice Needy, MD;  Location: [facility] ORS;  Service: Vascular;  Laterality: Bilateral;  . IRRIGATION AND DEBRIDEMENT FOOT Bilateral 05/25/2018   Procedure: IRRIGATION AND DEBRIDEMENT FOOT application wound vac left  foot;  Surgeon: Recardo Evangelist, DPM;  Location: [facility] ORS;  Service: Podiatry;  Laterality: Bilateral;  . IRRIGATION AND DEBRIDEMENT FOOT Right 07/11/2018   Procedure: FOOT DEBRIDEMENT;  Surgeon: Gwyneth Revels, DPM;  Location: [facility] ORS;  Service: Podiatry;  Laterality: Right;  . LOWER EXTREMITY ANGIOGRAPHY Left 05/26/2018   Procedure: Lower Extremity Angiography with possible intervention;  Surgeon: Annice Needy, MD;  Location: [facility] INVASIVE CV LAB;  Service: Cardiovascular;  Laterality: Left;  . LOWER EXTREMITY ANGIOGRAPHY Right 05/27/2018   Procedure: Lower Extremity Angiography;  Surgeon: Renford Dills, MD;  Location: [facility] INVASIVE CV LAB;  Service: Cardiovascular;  Laterality: Right;  . LOWER EXTREMITY ANGIOGRAPHY Right 07/15/2018   Procedure: Lower Extremity Angiography;  Surgeon: Renford Dills, MD;  Location: [facility] INVASIVE CV LAB;  Service: Cardiovascular;  Laterality: Right;     MEDICATIONS:  Prior to Admission medications   Medication Sig Start Date End Date Taking? Authorizing Provider  albuterol (PROVENTIL HFA;VENTOLIN HFA) 108 (90 Base) MCG/ACT inhaler Inhale 2 puffs into the lungs every 6 (six) hours as needed for wheezing  or shortness of breath.    [provider]  albuterol (PROVENTIL) (2.5 MG/3ML) 0.083% nebulizer solution Inhale 2.5 mg into the lungs every 6 (six) hours as needed for wheezing or shortness of breath.     [provider]  ALPRAZolam Prudy Feeler) 0.5 MG tablet Take 1 tablet (0.5 mg total) by mouth 2 (two) times daily as needed for anxiety. 07/26/18   Adrian Saran, MD  aspirin 81 MG chewable tablet Chew 81 mg by mouth daily. 05/30/17   [provider]  atorvastatin (LIPITOR) 40 MG tablet Take 40 mg by mouth daily. 10/24/17 10/24/18  [provider]  cephALEXin (KEFLEX) 500 MG capsule Take 1 capsule (500 mg total) by mouth 2 (two) times daily. 09/02/18   Georgiana Spinner, NP  collagenase (SANTYL) ointment Apply topically daily. 08/25/18   Auburn Bilberry, MD  ferrous sulfate 325 (65 FE) MG tablet Take 1 tablet (325 mg total) by mouth 2 (two) times daily with a meal. 06/01/18 07/01/18  Pyreddy, Vivien Rota, MD  Fluticasone-Salmeterol (ADVAIR DISKUS) 250-50 MCG/DOSE AEPB Inhale 1 puff into the lungs 2 (two) times daily. 03/14/15 01/16/26  Beers, Charmayne Sheer, PA-C  furosemide (LASIX) 40 MG tablet Take 1 tablet (40 mg total) by mouth daily. 06/14/18   Altamese Dilling, MD  gabapentin (NEURONTIN) 100 MG capsule Take 1 capsule (100 mg total) by mouth at bedtime. 07/26/18   Adrian Saran, MD  guaiFENesin-dextromethorphan (ROBITUSSIN DM) 100-10 MG/5ML syrup Take 5 mLs by mouth every 4 (four) hours as needed for cough. 04/11/18   Milagros Loll, MD  insulin aspart (NOVOLOG) 100 UNIT/ML injection Inject 3 Units into the skin 3 (three) times daily with meals. 07/26/18   Adrian Saran, MD  insulin glargine (LANTUS) 100 UNIT/ML injection Inject 0.15 mLs (15 Units total) into the skin at bedtime. 07/26/18   Adrian Saran, MD  ipratropium-albuterol (DUONEB) 0.5-2.5 (3) MG/3ML SOLN Take 3 mLs by nebulization 3 (three) times daily. 08/25/18   Auburn Bilberry, MD  metFORMIN (GLUCOPHAGE) 500 MG tablet  06/13/18    [provider]  midodrine (PROAMATINE) 10 MG tablet Take 1 tablet (10 mg total) by mouth 3 (three) times daily with meals. 07/26/18   Adrian Saran, MD  nicotine (NICODERM CQ - DOSED IN MG/24 HOURS) 14 mg/24hr patch Place 1 patch (14 mg total) onto the skin daily as needed (nicotine withdrawal). 07/26/18   Adrian Saran, MD  pantoprazole (PROTONIX) 40 MG tablet Take 40 mg by mouth 2 (two) times daily.  04/25/18   [provider]  polyethylene glycol (MIRALAX / GLYCOLAX) packet Take 17 g by mouth daily. 06/14/18   Altamese Dilling, MD  potassium chloride (K-DUR,KLOR-CON) 10 MEQ tablet Take by mouth. 06/13/18   [provider]  predniSONE (STERAPRED UNI-PAK 21 TAB) 10 MG (21) TBPK tablet Start at 60mg  taper by 10mg  until complete 08/25/18   Auburn Bilberry, MD  protein supplement shake (PREMIER PROTEIN) LIQD Take 325 mLs (11 oz total) by mouth 2 (two) times daily between meals. 06/14/18   Altamese Dilling, MD  rivaroxaban (XARELTO) 10 MG TABS tablet Take 10 mg by mouth every evening.    [provider]  SPIRIVA HANDIHALER 18 MCG inhalation capsule Place 18 mcg into inhaler and  inhale daily.  05/19/18   [provider]  tamsulosin (FLOMAX) 0.4 MG CAPS capsule Take 1 capsule (0.4 mg total) by mouth daily. 06/14/18   Altamese Dilling, MD  traMADol (ULTRAM) 50 MG tablet Take 1 tablet (50 mg total) by mouth every 6 (six) hours as needed for up to 5 days for moderate pain. 09/02/18 09/07/18  Georgiana Spinner, NP  vitamin C (VITAMIN C) 500 MG tablet Take 1 tablet (500 mg total) by mouth 2 (two) times daily. 06/13/18   Altamese Dilling, MD     ALLERGIES:  Allergies  Allergen Reactions  . Eggs Or Egg-Derived Products Diarrhea, Itching and Nausea And Vomiting     SOCIAL HISTORY:  Social History   Socioeconomic History  . Marital status: Divorced    Spouse name: Not on file  . Number of children: Not on file  . Years of education: Not on file    . Highest education level: Not on file  Occupational History  . Not on file  Social Needs  . Financial resource strain: Not on file  . Food insecurity:    Worry: Not on file    Inability: Not on file  . Transportation needs:    Medical: Not on file    Non-medical: Not on file  Tobacco Use  . Smoking status: Current Every Day Smoker    Packs/day: 1.50    Years: 47.00    Pack years: 70.50    Types: Cigarettes  . Smokeless tobacco: Never Used  . Tobacco comment: Smoking History 2.5-started at age 67  Substance and Sexual Activity  . Alcohol use: No    Alcohol/week: 0.0 standard drinks  . Drug use: No  . Sexual activity: Not on file  Lifestyle  . Physical activity:    Days per week: Not on file    Minutes per session: Not on file  . Stress: Not on file  Relationships  . Social connections:    Talks on phone: Not on file    Gets together: Not on file    Attends religious service: Not on file    Active member of club or organization: Not on file    Attends meetings of clubs or organizations: Not on file    Relationship status: Not on file  . Intimate partner violence:    Fear of current or ex partner: Not on file    Emotionally abused: Not on file    Physically abused: Not on file    Forced sexual activity: Not on file  Other Topics Concern  . Not on file  Social History Narrative  . Not on file     FAMILY HISTORY:  Family History  Problem Relation Age of Onset  . Brain cancer Mother       REVIEW OF SYSTEMS:  Review of Systems  Unable to perform ROS: Mental acuity  Constitutional: Negative for chills and fever.  Cardiovascular: Negative for chest pain.  Skin:       + L groin wound and drainage, + Sacral wound    VITAL SIGNS:  Temp:  [97.7 F (36.5 C)] 97.7 F (36.5 C) (03/06 1055) Pulse Rate:  [106] 106 (03/06 1204) Resp:  [13-25] 21 (03/06 1204) BP: (105-129)/(62-76) 105/62 (03/06 1200) SpO2:  [86 %-100 %] 98 % (03/06 1204) Weight:  [39 kg] 39 kg  (03/06 1056)     Height: 5\' 2"  (157.5 cm) Weight: 39 kg BMI (Calculated): 15.73   INTAKE/OUTPUT:  This shift: No intake/output data  recorded.  Last 2 shifts: @   PHYSICAL EXAM:  Physical Exam Constitutional:      Appearance: She is underweight. She is not toxic-appearing.     Comments: Patient appears much older than stated age  HENT:     Head: Normocephalic and atraumatic.  Eyes:     Conjunctiva/sclera: Conjunctivae normal.  Cardiovascular:     Rate and Rhythm: Regular rhythm. Tachycardia present.     Comments: Unable to assess distal pulse in RLE Pulmonary:     Effort: No respiratory distress.     Breath sounds: Normal breath sounds. No wheezing or rhonchi.     Comments: On [place] Musculoskeletal:     Right lower leg: No edema.     Comments: S/p Left BKA  Skin:    General: Skin is warm and dry.          Comments: L BKA  Neurological:     General: No focal deficit present.     Mental Status: She is alert.       Labs:  CBC Latest Ref Rng & Units 09/05/2018 08/25/2018 08/24/2018  WBC 4.0 - 10.5 K/uL 18.4(H) 9.8 8.7  Hemoglobin 12.0 - 15.0 g/dL 62.1 3.0(Q) 7.3(L)  Hematocrit 36.0 - 46.0 % 42.5 30.8(L) 25.3(L)  Platelets 150 - 400 K/uL 302 202 200   CMP Latest Ref Rng & Units 08/25/2018 08/24/2018 08/22/2018  Glucose 70 - 99 mg/dL 657(Q) 469(G) 295(M)  BUN 8 - 23 mg/dL 84(X) 32(G) 40(N)  Creatinine 0.44 - 1.00 mg/dL 0.27 2.53(G) 6.44(I)  Sodium 135 - 145 mmol/L 137 138 136  Potassium 3.5 - 5.1 mmol/L 3.8 4.7 4.0  Chloride 98 - 111 mmol/L 101 106 102  CO2 22 - 32 mmol/L Calcium 8.9 - 10.3 mg/dL 3.4(V) 4.2(V) 9.5(G)  Total Protein 6.5 - 8.1 g/dL - - -  Total Bilirubin 0.3 - 1.2 mg/dL - - -  Alkaline Phos 38 - 126 U/L - - -  AST 15 - 41 U/L - - -  ALT 0 - 44 U/L - - -     Imaging studies:  CT Pelvis pending   Assessment/Plan: (ICD-10's: L89.150) 63 y.o. female with tachycardia and leukocytosis as well as an unstageable sacral pressure injury  without gross abscess on examination, complicated by pertinent comorbidities including severe PAD s/p left BKA, COPD, DM, HLD, malnutrition, and current tobacco abuse (smoking).   - Continue NPO, IVF, IV ABx (Vancomycin, Zosyn)    - Will await results of CT Pelvis to determine whether or not this patient will need I&D in OR vs formal wound care inpatient.      - Vascular surgery to evaluate surgical wounds  - Recommend admission to medicine service  All of the above findings and recommendations were discussed with the patient. She left phone number of friend to contact [phone])  Thank you for the opportunity to participate in this patient's care.   -- Lynden Oxford, PA-C [facility] 09/05/2018, 12:36 PM [phone] M-F: 7am - 4pm   I saw and evaluated the patient.  I agree with the above documentation, exam, and plan, which I have edited where appropriate. Duanne Guess  12:57 PM  Addendum: CT pelvis completed and personally reviewed.   CLINICAL DATA:  Here with ex-boyfriend. Pt has had vascular surgery with balloons to legs. Wound opened. Recent amputation. Pt is diabetic. He has taken her back to vascular center 2 days ago for wound that he is concerned about. Was placed  on a second round of antibiotics. Groin wound is swollen per family. Draining yellow fluid per family. No fever. Per family, infection under the skin of the buttocks. Severe pain in this region.  EXAM: CT PELVIS WITH CONTRAST  TECHNIQUE: Multidetector CT imaging of the pelvis was performed using the standard protocol following the bolus administration of intravenous contrast.  CONTRAST:  75mL OMNIPAQUE IOHEXOL 300 MG/ML  SOLN  COMPARISON:  06/07/2018 CT abdomen and pelvis  FINDINGS: Urinary Tract: Distended urinary bladder. LOWER portions of the kidneys are unremarkable.  Bowel: Moderate stool burden. No bowel dilatation or bowel  wall thickening.  Vascular/Lymphatic: Aorto bi-iliac stent grafts. Bilateral femoral artery stents. There is dense atherosclerosis of the native vessels. Along the LEFT proximal femoral artery there is an irregular heterogeneous gas and fluid collection with rim enhancement which measures 1.7 x 7.8 x 2.5 centimeters. Small amount of air is identified within the wound at the skin, consistent with the history of draining wound. There is no evidence for pseudoaneurysm or arteriovenous fistula.  Reproductive:  The uterus is present.  No adnexal mass.  Other:  No ascites.  Anterior abdominal wall is unremarkable.  The LEFT gluteus maximus is enlarged and heterogeneous, measuring 5.4 centimeters, compared to 1.8 centimeters on the RIGHT. Vessels traverse the muscle and considerations include inflammation/infection, or resolving hematoma. Mass is significantly less likely given the normal appearance on the December scan. Given the heterogeneity, is difficult to determine if there is a drainable fluid collection in this region.  Significant diffuse body wall edema.  Musculoskeletal: No acute osseous abnormality.  IMPRESSION: 1. Heterogeneous gas and fluid collection adjacent to the LEFT femoral artery, suspicious for abscess. 2. Heterogeneous enlargement of the LEFT gluteus maximus, consistent with abscess, inflammation, or resolving hematoma. Given the heterogeneity, infection/inflammation is favored. Consider ultrasound to evaluate for drainable fluid collections. 3. Distended urinary bladder. 4. Vascular stents.  There is no clear drainable fluid collection seen on CT; I agree with US to evaluate for anything drainable.  For now, continue IV abx. Will re-assess tomorrow.

## 2018-09-05 NOTE — ED Notes (Signed)
3rd grn tube sent to lab for BMP.

## 2018-09-05 NOTE — ED Notes (Signed)
Pt given food tray and drink.  

## 2018-09-05 NOTE — ED Notes (Signed)
Attempted IV x1.

## 2018-09-05 NOTE — ED Notes (Addendum)
Lab called about BMP. Went to pt's room to collect 3rd grn top to send to lab but pt already left for CT.

## 2018-09-05 NOTE — ED Notes (Signed)
EDP Veronese notified in person of Lactiac 2.1.

## 2018-09-05 NOTE — ED Notes (Signed)
Ree Kida, RN states Alcario Drought, RN will call back with any questions momentarily.

## 2018-09-05 NOTE — ED Provider Notes (Signed)
[facility] Emergency Department Provider Note  ____________________________________________  Time seen: Approximately 12:00 PM  I have reviewed the triage vital signs and the nursing notes.   HISTORY  Chief Complaint Post-op Problem   HPI April [name] is a 63 y.o. female with with several chronic medical problems as listed below including left lower extremity endarterectomy and BKA in January 2020 who presents for evaluation of leg pain.  Patient is complaining of dehiscence and purulent discharge from the surgical site of her femoral endarterectomy.  She is also complaining of progressively worsening pain, redness, and swelling that originates in the site of a decubitus ulcer in the left buttock.  She was in her vascular surgeon's office 2 days ago and was restarted on oral antibiotics for questionable abscess with overlying cellulitis versus superinfected decubitus ulcer.  Her pain has become severe.  The redness has spread.  No fever or chills, no nausea or vomiting.  Patient has been taking her antibiotics.  Past Medical History:  Diagnosis Date  . Anxiety   . Colon polyps   . COPD (chronic obstructive pulmonary disease) (HCC)   . Diabetes mellitus without complication (HCC)   . DVT (deep venous thrombosis) (HCC)   . DVT (deep venous thrombosis) (HCC)   . History of colonic polyps   . Hyperlipidemia   . Peripheral artery disease (HCC)   . Pulmonary nodules/lesions, multiple 08/2012   NEGATIVE PET SCAN  . Retroperitoneal abscess (HCC)    history of in 04/2013  . Tobacco abuse     Patient Active Problem List   Diagnosis Date Noted  . PAD (peripheral artery disease) (HCC) 08/25/2018  . History of left below knee amputation (HCC) 08/25/2018  . Surgical site infection 08/25/2018  . Pressure injury of skin 08/22/2018  . COPD exacerbation (HCC) 08/21/2018  . Diabetic foot infection (HCC) 07/09/2018  . Acute on chronic respiratory failure with  hypoxemia (HCC) 06/03/2018  . Acute respiratory failure with hypoxia and hypercapnia (HCC)   . Acute osteomyelitis of right foot (HCC) 05/23/2018  . Protein-calorie malnutrition, severe 04/10/2018  . Diabetes (HCC) 04/08/2018  . COPD (chronic obstructive pulmonary disease) (HCC) 04/08/2018  . HLD (hyperlipidemia) 04/08/2018  . Diabetic foot ulcer (HCC) 04/08/2018  . 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 Elevated CEA 01/30/2014    Past Surgical History:  Procedure Laterality Date  . AMPUTATION Left 07/24/2018   Procedure: AMPUTATION BELOW KNEE;  Surgeon: Annice Needy, MD;  Location: [facility] ORS;  Service: Vascular;  Laterality: Left;  . APPLICATION OF WOUND VAC Right 07/11/2018   Procedure: APPLICATION OF WOUND VAC;  Surgeon: Gwyneth Revels, DPM;  Location: [facility] ORS;  Service: Podiatry;  Laterality: Right;  . CHOLECYSTECTOMY    . COLONOSCOPY  05/2007  . ENDARTERECTOMY FEMORAL Bilateral 07/17/2018   Procedure: ENDARTERECTOMY FEMORAL - BILATERAL SFA STENT PLACEMENT - LEFT;  Surgeon: Annice Needy, MD;  Location: [facility] ORS;  Service: Vascular;  Laterality: Bilateral;  . IRRIGATION AND DEBRIDEMENT FOOT Bilateral 05/25/2018   Procedure: IRRIGATION AND DEBRIDEMENT FOOT application wound vac left  foot;  Surgeon: Recardo Evangelist, DPM;  Location: [facility] ORS;  Service: Podiatry;  Laterality: Bilateral;  . IRRIGATION AND DEBRIDEMENT FOOT Right 07/11/2018   Procedure: FOOT DEBRIDEMENT;  Surgeon: Gwyneth Revels, DPM;  Location: [facility] ORS;  Service: Podiatry;  Laterality: Right;  . LOWER EXTREMITY ANGIOGRAPHY Left 05/26/2018   Procedure: Lower Extremity Angiography with possible intervention;  Surgeon: Annice Needy,  MD;  Location: [facility] INVASIVE CV LAB;  Service: Cardiovascular;  Laterality: Left;  . LOWER EXTREMITY ANGIOGRAPHY Right 05/27/2018   Procedure: Lower Extremity Angiography;  Surgeon: Renford Dills, MD;  Location: [facility] INVASIVE CV LAB;  Service:  Cardiovascular;  Laterality: Right;  . LOWER EXTREMITY ANGIOGRAPHY Right 07/15/2018   Procedure: Lower Extremity Angiography;  Surgeon: Renford Dills, MD;  Location: [facility] INVASIVE CV LAB;  Service: Cardiovascular;  Laterality: Right;    Prior to Admission medications   Medication Sig Start Date End Date Taking? Authorizing Provider  ALPRAZolam Prudy Feeler) 0.5 MG tablet Take 1 tablet (0.5 mg total) by mouth 2 (two) times daily as needed for anxiety. 07/26/18  Yes Adrian Saran, MD  aspirin 81 MG chewable tablet Chew 81 mg by mouth daily. 05/30/17  Yes [provider]  atorvastatin (LIPITOR) 40 MG tablet Take 40 mg by mouth daily. 10/24/17 10/24/18 Yes [provider]  cephALEXin (KEFLEX) 500 MG capsule Take 1 capsule (500 mg total) by mouth 2 (two) times daily. 09/02/18  Yes Georgiana Spinner, NP  Fluticasone-Salmeterol (ADVAIR DISKUS) 250-50 MCG/DOSE AEPB Inhale 1 puff into the lungs 2 (two) times daily. 03/14/15 01/16/26 Yes Beers, Charmayne Sheer, PA-C  gabapentin (NEURONTIN) 100 MG capsule Take 1 capsule (100 mg total) by mouth at bedtime. 07/26/18  Yes Mody, Patricia Pesa, MD  insulin aspart (NOVOLOG) 100 UNIT/ML injection Inject 3 Units into the skin 3 (three) times daily with meals. 07/26/18  Yes Mody, Patricia Pesa, MD  insulin glargine (LANTUS) 100 UNIT/ML injection Inject 0.15 mLs (15 Units total) into the skin at bedtime. 07/26/18  Yes Adrian Saran, MD  metFORMIN (GLUCOPHAGE) 500 MG tablet Take 500 mg by mouth daily with breakfast.  06/13/18  Yes [provider]  pantoprazole (PROTONIX) 40 MG tablet Take 40 mg by mouth 2 (two) times daily.  04/25/18  Yes [provider]  potassium chloride (K-DUR,KLOR-CON) 10 MEQ tablet Take by mouth. 06/13/18  Yes [provider]  rivaroxaban (XARELTO) 10 MG TABS tablet Take 10 mg by mouth every evening.   Yes [provider]  SPIRIVA HANDIHALER 18 MCG inhalation capsule Place 18 mcg into inhaler and inhale daily.  05/19/18  Yes  [provider]  tamsulosin (FLOMAX) 0.4 MG CAPS capsule Take 1 capsule (0.4 mg total) by mouth daily. 06/14/18  Yes Altamese Dilling, MD  traMADol (ULTRAM) 50 MG tablet Take 1 tablet (50 mg total) by mouth every 6 (six) hours as needed for up to 5 days for moderate pain. 09/02/18 09/07/18 Yes Georgiana Spinner, NP  vitamin C (VITAMIN C) 500 MG tablet Take 1 tablet (500 mg total) by mouth 2 (two) times daily. 06/13/18  Yes Altamese Dilling, MD  albuterol (PROVENTIL HFA;VENTOLIN HFA) 108 (90 Base) MCG/ACT inhaler Inhale 2 puffs into the lungs every 6 (six) hours as needed for wheezing or shortness of breath.    [provider]  albuterol (PROVENTIL) (2.5 MG/3ML) 0.083% nebulizer solution Inhale 2.5 mg into the lungs every 6 (six) hours as needed for wheezing or shortness of breath.     [provider]  collagenase (SANTYL) ointment Apply topically daily. 08/25/18   Auburn Bilberry, MD  ferrous sulfate 325 (65 FE) MG tablet Take 1 tablet (325 mg total) by mouth 2 (two) times daily with a meal. 06/01/18 07/01/18  Ihor Austin, MD  furosemide (LASIX) 40 MG tablet Take 1 tablet (40 mg total) by mouth daily. 06/14/18   Altamese Dilling, MD  guaiFENesin-dextromethorphan (ROBITUSSIN DM) 100-10 MG/5ML syrup  Take 5 mLs by mouth every 4 (four) hours as needed for cough. 04/11/18   Sudini, Wardell Heath, MD  ipratropium-albuterol (DUONEB) 0.5-2.5 (3) MG/3ML SOLN Take 3 mLs by nebulization 3 (three) times daily. 08/25/18   Auburn Bilberry, MD  midodrine (PROAMATINE) 10 MG tablet Take 1 tablet (10 mg total) by mouth 3 (three) times daily with meals. 07/26/18   Adrian Saran, MD  polyethylene glycol (MIRALAX / GLYCOLAX) packet Take 17 g by mouth daily. 06/14/18   Altamese Dilling, MD  predniSONE (STERAPRED UNI-PAK 21 TAB) 10 MG (21) TBPK tablet Start at  taper by  until complete Patient not taking: Reported on 09/05/2018 08/25/18   Auburn Bilberry, MD  protein supplement shake  (PREMIER PROTEIN) LIQD Take 325 mLs (11 oz total) by mouth 2 (two) times daily between meals. 06/14/18   Altamese Dilling, MD    Allergies Eggs or egg-derived products  Family History  Problem Relation Age of Onset  . Brain cancer Mother     Social History Social History   Tobacco Use  . Smoking status: Current Every Day Smoker    Packs/day: 1.50    Years: 47.00    Pack years: 70.50    Types: Cigarettes  . Smokeless tobacco: Never Used  . Tobacco comment: Smoking History 2.5-started at age 40  Substance Use Topics  . Alcohol use: No    Alcohol/week: 0.0 standard drinks  . Drug use: No    Review of Systems  Constitutional: Negative for fever. Eyes: Negative for visual changes. ENT: Negative for sore throat. Neck: No neck pain  Cardiovascular: Negative for chest pain. Respiratory: Negative for shortness of breath. Gastrointestinal: Negative for abdominal pain, vomiting or diarrhea. Genitourinary: Negative for dysuria. Musculoskeletal: Negative for back pain. + L buttock pain Skin: Negative for rash. Neurological: Negative for headaches, weakness or numbness. Psych: No SI or HI  ____________________________________________   PHYSICAL EXAM:  VITAL SIGNS: ED Triage Vitals  Enc Vitals Group     BP 09/05/18 1055 129/76     Pulse Rate 09/05/18 1055 (!) 106     Resp 09/05/18 1055 16     Temp 09/05/18 1055 97.7 F (36.5 C)     Temp Source 09/05/18 1055 Oral     SpO2 09/05/18 1055 100 %     Weight 09/05/18 1056 86 lb (39 kg)     Height 09/05/18 1056  (1.575 m)     Head Circumference --      Peak Flow --      Pain Score 09/05/18 1055 10     Pain Loc --      Pain Edu? --      Excl. in GC? --     Constitutional: Alert and oriented, chronically ill appearing.  HEENT:      Head: Normocephalic and atraumatic.         Eyes: Conjunctivae are normal. Sclera is non-icteric.       Mouth/Throat: Mucous membranes are moist.       Neck: Supple with no signs  of meningismus. Cardiovascular: Tachycardic with regular rhythm. No murmurs, gallops, or rubs. 2+ symmetrical distal pulses are present in all extremities. No JVD. Respiratory: Normal respiratory effort. Lungs are clear to auscultation bilaterally. No wheezes, crackles, or rhonchi.  Gastrointestinal: Soft, non tender, and non distended with positive bowel sounds. No rebound or guarding. Genitourinary: decubitus ulcer seen on the L buttock with significant swelling, induration, erythema, warmth and tenderness spreading throughout the left butt cheek and down her  leg.  No crepitus or bulla seen.  The site of left femoral endarterectomy is dehisced with small amount of blood and purulent discharge, no significant overlying erythema or tenderness Musculoskeletal: Nontender with normal range of motion in all extremities. No edema, cyanosis, or erythema of extremities. Neurologic: Normal speech and language. Face is symmetric. Moving all extremities. No gross focal neurologic deficits are appreciated. Skin: Skin is warm, dry and intact. No rash noted. Psychiatric: Mood and affect are normal. Speech and behavior are normal.  ____________________________________________   LABS (all labs ordered are listed, but only abnormal results are displayed)  Labs Reviewed  CBC WITH DIFFERENTIAL/PLATELET - Abnormal; Notable for the following components:      Result Value   WBC 18.4 (*)    Neutro Abs 16.7 (*)    All other components within normal limits  BASIC METABOLIC PANEL - Abnormal; Notable for the following components:   Sodium 126 (*)    Chloride 89 (*)    Glucose, Bld 544 (*)    BUN 28 (*)    Calcium 7.7 (*)    All other components within normal limits  LACTIC ACID, PLASMA - Abnormal; Notable for the following components:   Lactic Acid, Venous 2.1 (*)    All other components within normal limits  CULTURE, BLOOD (ROUTINE X 2)  CULTURE, BLOOD (ROUTINE X 2)  URINALYSIS, ROUTINE W REFLEX MICROSCOPIC    CBG MONITORING, ED   ____________________________________________  EKG  none  ____________________________________________  RADIOLOGY  I have personally reviewed the images performed during this visit and I agree with the Radiologist's read.   Interpretation by Radiologist:  Ct Pelvis W Contrast  Result Date: 09/05/2018 CLINICAL DATA:  Here with ex-boyfriend. Pt has had vascular surgery with balloons to legs. Wound opened. Recent amputation. Pt is diabetic. He has taken her back to vascular center 2 days ago for wound that he is concerned about. Was placed on a second round of antibiotics. Groin wound is swollen per family. Draining yellow fluid per family. No fever. Per family, infection under the skin of the buttocks. Severe pain in this region. EXAM: CT PELVIS WITH CONTRAST TECHNIQUE: Multidetector CT imaging of the pelvis was performed using the standard protocol following the bolus administration of intravenous contrast. CONTRAST:  75mL OMNIPAQUE IOHEXOL 300 MG/ML  SOLN COMPARISON:  06/07/2018 CT abdomen and pelvis FINDINGS: Urinary Tract: Distended urinary bladder. LOWER portions of the kidneys are unremarkable. Bowel: Moderate stool burden. No bowel dilatation or bowel wall thickening. Vascular/Lymphatic: Aorto bi-iliac stent grafts. Bilateral femoral artery stents. There is dense atherosclerosis of the native vessels. Along the LEFT proximal femoral artery there is an irregular heterogeneous gas and fluid collection with rim enhancement which measures 1.7 x 7.8 x 2.5 centimeters. Small amount of air is identified within the wound at the skin, consistent with the history of draining wound. There is no evidence for pseudoaneurysm or arteriovenous fistula. Reproductive:  The uterus is present.  No adnexal mass. Other:  No ascites.  Anterior abdominal wall is unremarkable. The LEFT gluteus maximus is enlarged and heterogeneous, measuring 5.4 centimeters, compared to 1.8 centimeters on the  RIGHT. Vessels traverse the muscle and considerations include inflammation/infection, or resolving hematoma. Mass is significantly less likely given the normal appearance on the December scan. Given the heterogeneity, is difficult to determine if there is a drainable fluid collection in this region. Significant diffuse body wall edema. Musculoskeletal: No acute osseous abnormality. IMPRESSION: 1. Heterogeneous gas and fluid collection adjacent to the LEFT  femoral artery, suspicious for abscess. 2. Heterogeneous enlargement of the LEFT gluteus maximus, consistent with abscess, inflammation, or resolving hematoma. Given the heterogeneity, infection/inflammation is favored. Consider ultrasound to evaluate for drainable fluid collections. 3. Distended urinary bladder. 4. Vascular stents. Electronically Signed   By: Norva Pavlov M.D.   On: 09/05/2018 15:25      ____________________________________________   PROCEDURES  Procedure(s) performed: None Procedures Critical Care performed:  Yes  CRITICAL CARE Performed by: Nita Sickle  ?  Total critical care time: 45 min  Critical care time was exclusive of separately billable procedures and treating other patients.  Critical care was necessary to treat or prevent imminent or life-threatening deterioration.  Critical care was time spent personally by me on the following activities: development of treatment plan with patient and/or surrogate as well as nursing, discussions with consultants, evaluation of patient's response to treatment, examination of patient, obtaining history from patient or surrogate, ordering and performing treatments and interventions, ordering and review of laboratory studies, ordering and review of radiographic studies, pulse oximetry and re-evaluation of patient's condition.  ____________________________________________   INITIAL IMPRESSION / ASSESSMENT AND PLAN / ED COURSE   63 y.o. female with with several  chronic medical problems as listed below including left lower extremity endarterectomy and BKA in January 2020 who presents for evaluation of leg pain.  Patient has dehiscence with purulent discharge coming from the site of her left endarterectomy, no crepitus or significant redness otherwise.  She does have a large area of induration with erythema, tenderness, and warmth in her left buttock region.  Patient has a decubitus ulcer in that region as well.  Discussed with Dr. Wyn Quaker from vascular surgery and Dr. Lady Gary from surgery.  At this time patient does not meet sepsis criteria.  Will start patient on vancomycin and Zosyn, morphine for pain control.    _________________________ 3:33 PM on 09/05/2018 -----------------------------------------  Labs concerning for sepsis. CT showing possible abscess. Patient evaluated by Dr. Lady Gary, surgery who wishes to hold off on surgical intervention at this time based on patient's comorbidities and she wants to see how patient responds to IV abx. She will follow closely in case patient needs OR debridement. Dr. Wyn Quaker agrees with IV abx and no further recs at this time for surgical site infection. Patient's labs showing hyperglycemia with no evidence of DKA. Patient given IVF and insulin. CT concerning for urinary retention which was confirmed by beside [place], Foley placed. Discussed with Dr. Katheren Shams for admission.   As part of my medical decision making, I reviewed the following data within the electronic MEDICAL RECORD NUMBER Nursing notes reviewed and incorporated, Labs reviewed , Old chart reviewed, Radiograph reviewed , Discussed with admitting physician , A consult was requested and obtained from this/these consultant(s) Vascular surgery and Gen surgery, Notes from prior ED visits and [place] Controlled Substance Database    Pertinent labs & imaging results that were available during my care of the patient were reviewed by me and considered in my medical decision making (see  chart for details).    ____________________________________________   FINAL CLINICAL IMPRESSION(S) / ED DIAGNOSES  Final diagnoses:  Cellulitis and abscess of buttock  Sepsis without acute organ dysfunction, due to unspecified organism (HCC)  Type 2 diabetes mellitus with hyperglycemia, with long-term current use of insulin (HCC)  Surgical site infection  Urinary retention      NEW MEDICATIONS STARTED DURING THIS VISIT:  ED Discharge Orders    None  Note:  This document was prepared using Dragon voice recognition software and may include unintentional dictation errors.    Don Perking, [place], MD 09/05/18 1537

## 2018-09-05 NOTE — Progress Notes (Signed)
Responded to PIV consult. On arrival, RN had obtained PIV and stated VAS Team no longer needed.

## 2018-09-05 NOTE — ED Notes (Signed)
ED TO INPATIENT HANDOFF REPORT  ED Nurse Name and Phone #: Greta Doom [phone]   S Name/Age/Gender Marybelle Killings 63 y.o. female Room/Bed: ED12A/ED12A  Code Status   Code Status: Prior  Home/SNF/Other Needs short term nursing facility Patient oriented to: self, place, time and situation Is this baseline? Yes   Triage Complete: Triage complete  Chief Complaint Post op problem   Triage Note Here with ex-boyfriend.  Pt has had vascular surgery with balloons to legs.  Wound opened. Recent amputation.  Pt is diabetic.  He has taken her back to vascular center 2 days ago for wound that he is concerned about and was placed on abx for second time.  Wound to groin is swollen per family.  Draining bloody/clear/yellow per family.  No fevers.  Per family sore has had infection under skin to buttocks. Pt having severe pain here.   Allergies Allergies  Allergen Reactions  . Eggs Or Egg-Derived Products Diarrhea, Itching and Nausea And Vomiting    Level of Care/Admitting Diagnosis ED Disposition    ED Disposition Condition Comment   Admit  Hospital Area: [facility] [[id number]]  Level of Care: Med-Surg [16]  Diagnosis: Sepsis [facility]) [[id number]]  Admitting Physician: Bertrum Sol [[id number]]  Attending Physician: Bertrum Sol [[id number]]  Estimated length of stay: past midnight tomorrow  Certification:: I certify this patient will need inpatient services for at least 2 midnights  PT Class (Do Not Modify): Inpatient [101]  PT Acc Code (Do Not Modify): Private [1]       B Medical/Surgery History Past Medical History:  Diagnosis Date  . Anxiety   . Colon polyps   . COPD (chronic obstructive pulmonary disease) (HCC)   . Diabetes mellitus without complication (HCC)   . DVT (deep venous thrombosis) (HCC)   . DVT (deep venous thrombosis) (HCC)   . History of colonic polyps   . Hyperlipidemia   . Peripheral artery disease (HCC)   . Pulmonary nodules/lesions,  multiple 08/2012   NEGATIVE PET SCAN  . Retroperitoneal abscess (HCC)    history of in 04/2013  . Tobacco abuse    Past Surgical History:  Procedure Laterality Date  . AMPUTATION Left 07/24/2018   Procedure: AMPUTATION BELOW KNEE;  Surgeon: Annice Needy, MD;  Location: [facility] ORS;  Service: Vascular;  Laterality: Left;  . APPLICATION OF WOUND VAC Right 07/11/2018   Procedure: APPLICATION OF WOUND VAC;  Surgeon: Gwyneth Revels, DPM;  Location: [facility] ORS;  Service: Podiatry;  Laterality: Right;  . CHOLECYSTECTOMY    . COLONOSCOPY  05/2007  . ENDARTERECTOMY FEMORAL Bilateral 07/17/2018   Procedure: ENDARTERECTOMY FEMORAL - BILATERAL SFA STENT PLACEMENT - LEFT;  Surgeon: Annice Needy, MD;  Location: [facility] ORS;  Service: Vascular;  Laterality: Bilateral;  . IRRIGATION AND DEBRIDEMENT FOOT Bilateral 05/25/2018   Procedure: IRRIGATION AND DEBRIDEMENT FOOT application wound vac left  foot;  Surgeon: Recardo Evangelist, DPM;  Location: [facility] ORS;  Service: Podiatry;  Laterality: Bilateral;  . IRRIGATION AND DEBRIDEMENT FOOT Right 07/11/2018   Procedure: FOOT DEBRIDEMENT;  Surgeon: Gwyneth Revels, DPM;  Location: [facility] ORS;  Service: Podiatry;  Laterality: Right;  . LOWER EXTREMITY ANGIOGRAPHY Left 05/26/2018   Procedure: Lower Extremity Angiography with possible intervention;  Surgeon: Annice Needy, MD;  Location: [facility] INVASIVE CV LAB;  Service: Cardiovascular;  Laterality: Left;  . LOWER EXTREMITY ANGIOGRAPHY Right 05/27/2018   Procedure: Lower Extremity Angiography;  Surgeon: Renford Dills, MD;  Location: [facility] INVASIVE CV LAB;  Service:  Cardiovascular;  Laterality: Right;  . LOWER EXTREMITY ANGIOGRAPHY Right 07/15/2018   Procedure: Lower Extremity Angiography;  Surgeon: Renford DillsSchnier, Gregory G, MD;  Location: [facility] INVASIVE CV LAB;  Service: Cardiovascular;  Laterality: Right;     A IV Location/Drains/Wounds Patient Lines/Drains/Airways Status   Active Line/Drains/Airways    Name:   Placement date:    Placement time:   Site:   Days:   Peripheral IV 09/05/18 Right Forearm   09/05/18    1124    Forearm   less than 1   Peripheral IV 09/05/18 Right;Upper Arm   09/05/18    1311    Arm   less than 1   PICC Single Lumen 07/25/18 PICC Right Basilic 35 cm 0 cm   07/25/18    1450    Basilic   42   Negative Pressure Wound Therapy Foot Right   07/11/18    1300    -   56   Urethral Catheter Verl BangsAmanda Perez RN Double-lumen   06/13/18    1555    Double-lumen   84   Urethral Catheter Stephen, RN Double-lumen 16 Fr.   09/05/18    1634    Double-lumen   less than 1   External Urinary Catheter   08/22/18    1845    -   14   Incision (Closed) 07/11/18 Foot Right   07/11/18    1304     56   Incision (Closed) 07/17/18 Groin Right   07/17/18    1523     50   Incision (Closed) 07/17/18 Groin Left   07/17/18    1524     50   Incision (Closed) 07/24/18 Leg Left   07/24/18    1157     43   Pressure Injury 04/09/18 wound on top of right foot   04/09/18    1020     149   Pressure Injury 08/21/18 Stage III -  Full thickness tissue loss. Subcutaneous fat may be visible but bone, tendon or muscle are NOT exposed.   08/21/18    2300     15   Pressure Injury 08/21/18 Stage II -  Partial thickness loss of dermis presenting as a shallow open ulcer with a red, pink wound bed without slough.   08/21/18    2300     15   Wound / Incision (Open or Dehisced) 08/22/18 Incision - Open Leg Left;Proximal   08/22/18    1847    Leg   14   Wound / Incision (Open or Dehisced) 08/22/18 Other (Comment) Groin Anterior;Right;Proximal;Left Incision/staples   08/22/18    0730    Groin   14          Intake/Output Last 24 hours No intake or output data in the 24 hours ending 09/05/18 1640  Labs/Imaging Results for orders placed or performed during the hospital encounter of 09/05/18 (from the past 48 hour(s))  CBC with Differential/Platelet     Status: Abnormal   Collection Time: 09/05/18 11:41 AM  Result Value Ref Range   WBC 18.4 (H) 4.0 -  10.5 K/uL   RBC 4.39 3.87 - 5.11 MIL/uL   Hemoglobin 13.3 12.0 - 15.0 g/dL   HCT 16.142.5 09.636.0 - 04.546.0 %   MCV 96.8 80.0 - 100.0 fL   MCH 30.3 26.0 - 34.0 pg   MCHC 31.3 30.0 - 36.0 g/dL   RDW 40.915.4 81.111.5 - 91.415.5 %   Platelets 302 150 -  400 K/uL   nRBC 0.0 0.0 - 0.2 %   Neutrophils Relative % 92 %   Neutro Abs 16.7 (H) 1.7 - 7.7 K/uL   Lymphocytes Relative 5 %   Lymphs Abs 0.8 0.7 - 4.0 K/uL   Monocytes Relative 3 %   Monocytes Absolute 0.6 0.1 - 1.0 K/uL   Eosinophils Relative 0 %   Eosinophils Absolute 0.1 0.0 - 0.5 K/uL   Basophils Relative 0 %   Basophils Absolute 0.1 0.0 - 0.1 K/uL   Immature Granulocytes 0 %   Abs Immature Granulocytes 0.07 0.00 - 0.07 K/uL    Comment: Performed at [facility], [address] [place], [place] [address]  Lactic acid, plasma     Status: Abnormal   Collection Time: 09/05/18 12:25 PM  Result Value Ref Range   Lactic Acid, Venous 2.1 (HH) 0.5 - 1.9 mmol/L    Comment: CRITICAL RESULT CALLED TO, READ BACK BY AND VERIFIED WITH  GEORGIE Chelesa Weingartner AT 1257 09/05/18 SDR Performed at [facility], [address] [place], [place] [address]   Basic metabolic panel     Status: Abnormal   Collection Time: 09/05/18  2:26 PM  Result Value Ref Range   Sodium 126 (L) 135 - 145 mmol/L   Potassium 5.0 3.5 - 5.1 mmol/L   Chloride 89 (L) 98 - 111 mmol/L   CO2 27 22 - 32 mmol/L   Glucose, Bld 544 (HH) 70 - 99 mg/dL    Comment: CRITICAL RESULT CALLED TO, READ BACK BY AND VERIFIED WITH GEORGIE Nelwyn Hebdon 09/05/18 @ 1454  MLK    BUN 28 (H) 8 - 23 mg/dL   Creatinine, Ser 8.25 0.44 - 1.00 mg/dL   Calcium 7.7 (L) 8.9 - 10.3 mg/dL   GFR calc non Af Amer >60 >60 mL/min   GFR calc Af Amer >60 >60 mL/min   Anion gap 10 5 - 15    Comment: Performed at [facility], [address] [place], [place] [address]  Glucose, capillary     Status: Abnormal   Collection Time: 09/05/18  3:40 PM  Result Value Ref Range   Glucose-Capillary 508 (HH) 70 - 99 mg/dL    Comment 1 Document in Chart   Lactic acid, plasma     Status: None   Collection Time: 09/05/18  3:49 PM  Result Value Ref Range   Lactic Acid, Venous 1.6 0.5 - 1.9 mmol/L    Comment: Performed at [facility], [address] [place], [place] [address]   Ct Pelvis W Contrast  Result Date: 09/05/2018 CLINICAL DATA:  Here with ex-boyfriend. Pt has had vascular surgery with balloons to legs. Wound opened. Recent amputation. Pt is diabetic. He has taken her back to vascular center 2 days ago for wound that he is concerned about. Was placed on a second round of antibiotics. Groin wound is swollen per family. Draining yellow fluid per family. No fever. Per family, infection under the skin of the buttocks. Severe pain in this region. EXAM: CT PELVIS WITH CONTRAST TECHNIQUE: Multidetector CT imaging of the pelvis was performed using the standard protocol following the bolus administration of intravenous contrast. CONTRAST:  50mL OMNIPAQUE IOHEXOL 300 MG/ML  SOLN COMPARISON:  06/07/2018 CT abdomen and pelvis FINDINGS: Urinary Tract: Distended urinary bladder. LOWER portions of the kidneys are unremarkable. Bowel: Moderate stool burden. No bowel dilatation or bowel wall thickening. Vascular/Lymphatic: Aorto bi-iliac stent grafts. Bilateral femoral artery stents. There is dense atherosclerosis of the native  vessels. Along the LEFT proximal femoral artery there is an irregular heterogeneous gas and fluid collection with rim enhancement which measures 1.7 x 7.8 x 2.5 centimeters. Small amount of air is identified within the wound at the skin, consistent with the history of draining wound. There is no evidence for pseudoaneurysm or arteriovenous fistula. Reproductive:  The uterus is present.  No adnexal mass. Other:  No ascites.  Anterior abdominal wall is unremarkable. The LEFT gluteus maximus is enlarged and heterogeneous, measuring 5.4 centimeters, compared to 1.8 centimeters on the RIGHT. Vessels traverse  the muscle and considerations include inflammation/infection, or resolving hematoma. Mass is significantly less likely given the normal appearance on the December scan. Given the heterogeneity, is difficult to determine if there is a drainable fluid collection in this region. Significant diffuse body wall edema. Musculoskeletal: No acute osseous abnormality. IMPRESSION: 1. Heterogeneous gas and fluid collection adjacent to the LEFT femoral artery, suspicious for abscess. 2. Heterogeneous enlargement of the LEFT gluteus maximus, consistent with abscess, inflammation, or resolving hematoma. Given the heterogeneity, infection/inflammation is favored. Consider ultrasound to evaluate for drainable fluid collections. 3. Distended urinary bladder. 4. Vascular stents. Electronically Signed   By: Norva Pavlov M.D.   On: 09/05/2018 15:25    Pending Labs Unresulted Labs (From admission, onward)    Start     Ordered   09/05/18 1457  Urinalysis, Routine w reflex microscopic  ONCE - STAT,   STAT     09/05/18 1456   09/05/18 1111  Blood culture (routine x 2)  BLOOD CULTURE X 2,   STAT     09/05/18 1111   Signed and Held  [employer] morning,   R     Signed and Held   Signed and Held  Cortisol-am, blood  Tomorrow morning,   R     Signed and Held   Signed and Held  Procalcitonin  Tomorrow morning,   R     Signed and Held   Signed and Held  TSH  Once,   R     Signed and Held          Vitals/Pain Today's Vitals   09/05/18 1204 09/05/18 1320 09/05/18 1355 09/05/18 1400  BP:      Pulse: (!) 106 97 99   Resp: (!) Temp:      TempSrc:      SpO2: 98% 100% 100%   Weight:      Height:      PainSc:    7     Isolation Precautions No active isolations  Medications Medications  vancomycin (VANCOCIN) 500 mg in sodium chloride 0.9 % 100 mL IVPB (has no administration in time range)  ceFEPIme (MAXIPIME) 2 g in sodium chloride 0.9 % 100 mL IVPB (has no administration in time range)   piperacillin-tazobactam (ZOSYN) IVPB 3.375 g (0 g Intravenous Stopped 09/05/18 1307)  vancomycin (VANCOCIN) IVPB 1000 mg/200 mL premix (0 mg Intravenous Stopped 09/05/18 1410)  morphine 2 MG/ML injection 2 mg (2 mg Intravenous Given 09/05/18 1150)  ondansetron (ZOFRAN) injection 4 mg (4 mg Intravenous Given 09/05/18 1150)  sodium chloride 0.9 % bolus 1,000 mL (1,000 mLs Intravenous New Bag/Given 09/05/18 1311)  iohexol (OMNIPAQUE) 300 MG/ML solution 100 mL (75 mLs Intravenous Contrast Given 09/05/18 1401)  sodium chloride 0.9 % bolus 1,000 mL (1,000 mLs Intravenous New Bag/Given 09/05/18 1523)    Mobility Is carried around house by ex-boyfriend.  Low fall risk   Focused Assessments  Wounds to L/R groin and sacrum; oozing; infection evident.    R Recommendations: See Admitting Provider Note  Report given to:   Additional Notes:  IVs on R arm 22g & 20g; receiving 2nd [place] bolus now; BG in 500s; been given Zosyn & vanc; foley cath.

## 2018-09-05 NOTE — ED Triage Notes (Signed)
Here with ex-boyfriend.  Pt has had vascular surgery with balloons to legs.  Wound opened. Recent amputation.  Pt is diabetic.  He has taken her back to vascular center 2 days ago for wound that he is concerned about and was placed on abx for second time.  Wound to groin is swollen per family.  Draining bloody/clear/yellow per family.  No fevers.  Per family sore has had infection under skin to buttocks. Pt having severe pain here.

## 2018-09-05 NOTE — ED Notes (Addendum)
Wound at sacrum now oozing. Pt desat sustained in lower 80s on RA; placed on 2L O2 [place]; now 99% sat.

## 2018-09-05 NOTE — ED Notes (Signed)
Pt continues to request iced water. EDP verb to hold until CT back.

## 2018-09-05 NOTE — ED Notes (Signed)
Lab called to help obtain 2nd set of cultures.  

## 2018-09-05 NOTE — ED Notes (Signed)
BG 508.

## 2018-09-05 NOTE — ED Notes (Signed)
Pt given 1 warm blanket.  

## 2018-09-05 NOTE — Progress Notes (Signed)
CODE SEPSIS - PHARMACY COMMUNICATION  **Broad Spectrum Antibiotics should be administered within 1 hour of Sepsis diagnosis**  Time Code Sepsis Called/Page Received: 1458  Antibiotics Ordered: pip/tazo and vancomycin   Time of 1st antibiotic administration: 1235  Ronnald Ramp ,PharmD Clinical Pharmacist  09/05/2018  3:28 PM

## 2018-09-05 NOTE — ED Notes (Signed)
EKG completed

## 2018-09-06 ENCOUNTER — Other Ambulatory Visit: Payer: Self-pay

## 2018-09-06 ENCOUNTER — Encounter: Payer: Self-pay | Admitting: Surgery

## 2018-09-06 DIAGNOSIS — L03317 Cellulitis of buttock: Secondary | ICD-10-CM

## 2018-09-06 LAB — BLOOD CULTURE ID PANEL (REFLEXED)
Acinetobacter baumannii: NOT DETECTED
Candida albicans: NOT DETECTED
Candida glabrata: NOT DETECTED
Candida krusei: NOT DETECTED
Candida parapsilosis: NOT DETECTED
Candida tropicalis: NOT DETECTED
ENTEROBACTERIACEAE SPECIES: NOT DETECTED
ESCHERICHIA COLI: NOT DETECTED
Enterobacter cloacae complex: NOT DETECTED
Enterococcus species: NOT DETECTED
Haemophilus influenzae: NOT DETECTED
Klebsiella oxytoca: NOT DETECTED
Klebsiella pneumoniae: NOT DETECTED
Listeria monocytogenes: NOT DETECTED
Methicillin resistance: DETECTED — AB
NEISSERIA MENINGITIDIS: NOT DETECTED
PSEUDOMONAS AERUGINOSA: NOT DETECTED
Proteus species: NOT DETECTED
Serratia marcescens: NOT DETECTED
Staphylococcus aureus (BCID): NOT DETECTED
Staphylococcus species: DETECTED — AB
Streptococcus agalactiae: NOT DETECTED
Streptococcus pneumoniae: NOT DETECTED
Streptococcus pyogenes: NOT DETECTED
Streptococcus species: NOT DETECTED

## 2018-09-06 LAB — GLUCOSE, CAPILLARY
GLUCOSE-CAPILLARY: 157 mg/dL — AB (ref 70–99)
Glucose-Capillary: 172 mg/dL — ABNORMAL HIGH (ref 70–99)
Glucose-Capillary: 113 mg/dL — ABNORMAL HIGH (ref 70–99)
Glucose-Capillary: 120 mg/dL — ABNORMAL HIGH (ref 70–99)

## 2018-09-06 LAB — PROTIME-INR
INR: 1.1 (ref 0.8–1.2)
Prothrombin Time: 14 seconds (ref 11.4–15.2)

## 2018-09-06 LAB — CORTISOL-AM, BLOOD: Cortisol - AM: 14.1 ug/dL (ref 6.7–22.6)

## 2018-09-06 LAB — PROCALCITONIN: Procalcitonin: 0.27 ng/mL

## 2018-09-06 MED ORDER — INSULIN ASPART 100 UNIT/ML ~~LOC~~ SOLN
0.0000 [IU] | Freq: Every day | SUBCUTANEOUS | Status: DC
  Administered 2018-09-11: 4 [IU] via SUBCUTANEOUS
  Filled 2018-09-06: qty 1

## 2018-09-06 MED ORDER — IPRATROPIUM-ALBUTEROL 0.5-2.5 (3) MG/3ML IN SOLN: 3.0000 mL | RESPIRATORY_TRACT | Status: DC | PRN

## 2018-09-06 MED ORDER — POVIDONE-IODINE 10 % EX SOLN
Freq: Two times a day (BID) | CUTANEOUS | Status: DC
  Administered 2018-09-06: 1 via TOPICAL
  Administered 2018-09-06 – 2018-09-12 (×12): via TOPICAL

## 2018-09-06 MED ORDER — SODIUM CHLORIDE 0.9 % IV SOLN
INTRAVENOUS | Status: DC
  Administered 2018-09-06: 23:00:00 via INTRAVENOUS

## 2018-09-06 MED ORDER — SODIUM CHLORIDE 0.9% IV SOLUTION
Freq: Once | INTRAVENOUS | Status: AC
  Administered 2018-09-06: 22:00:00 via INTRAVENOUS

## 2018-09-06 MED ORDER — INSULIN ASPART 100 UNIT/ML ~~LOC~~ SOLN
0.0000 [IU] | Freq: Three times a day (TID) | SUBCUTANEOUS | Status: DC
  Administered 2018-09-06: 2 [IU] via SUBCUTANEOUS
  Administered 2018-09-08: 3 [IU] via SUBCUTANEOUS
  Administered 2018-09-08 – 2018-09-11 (×3): 5 [IU] via SUBCUTANEOUS
  Administered 2018-09-11: 7 [IU] via SUBCUTANEOUS
  Administered 2018-09-11 – 2018-09-12 (×2): 2 [IU] via SUBCUTANEOUS
  Administered 2018-09-12: 9 [IU] via SUBCUTANEOUS
  Administered 2018-09-12: 3 [IU] via SUBCUTANEOUS
  Administered 2018-09-13: 1 [IU] via SUBCUTANEOUS
  Administered 2018-09-13: 3 [IU] via SUBCUTANEOUS
  Filled 2018-09-06 (×13): qty 1

## 2018-09-06 NOTE — Progress Notes (Signed)
Subjective/Chief Complaint: [name] who was admitted through the emergency department yesterday.  She has a history of vascular occlusive disease and underwent femoral endarterectomy followed by subsequent left below the knee amputation.  She came in to the ED with concerns for wound infections at her surgical sites.  There was also concern for a sacral decubitus ulcer or abscess.  A CT scan was performed that did not demonstrate any abscess in the area of concern.  The imaging findings were more consistent with inflammation/infection, or resolving hematoma.  The sacral site shows a small amount of fibrinous exudate at the skin surface without any fluctuance, drainage, or tracking.  This morning, the patient is quite somnolent.  She arouses with mild stimulation, complains of pain, and promptly falls back to sleep.   Objective: Vital signs in last 24 hours: Temp:  [96.8 F (36 C)-98.5 F (36.9 C)] 97.7 F (36.5 C) (03/07 1132) Pulse Rate:  [91-112] 91 (03/07 1132) Resp:  [11-25] 16 (03/07 0450) BP: (94-130)/(46-95) 94/53 (03/07 1132) SpO2:  [86 %-100 %] 100 % (03/07 1132) Weight:  [39 kg] 39 kg (03/06 1957) Last BM Date: 09/03/18  Intake/Output from previous day: 03/06 0701 - 03/07 0700 In: 272.9 [I.V.:7.1; IV Piggyback:265.8] Out: 400 [Urine:400] Intake/Output this shift: No intake/output data recorded.  General appearance: appears older than stated age, cachectic and somewhat somnolent; rouses with mild stimulation Resp: on [place], no increase WOB Cardio: regular rate and rhythm Skin: Just posterior to the greater trochanter on the left, there is an area of indurated soft tissue.  There is no associated erythema, warmth, or areas of fluctuance.  The skin overlying the sacrum is mildly erythematous.  There is a small sore, roughly 1.5 x 1 cm and flush with the skin.  There is fibrinous exudate present.  No drainage or fluctuance.  Lab Results:  Recent Labs     09/05/18 1141  WBC 18.4*  HGB 13.3  HCT 42.5  PLT 302   BMET Recent Labs    09/05/18 1426  NA 126*  K 5.0  CL 89*  CO2 27  GLUCOSE 544*  BUN 28*  CREATININE 0.83  CALCIUM 7.7*   PT/INR Recent Labs    09/06/18 0519  LABPROT 14.0  INR 1.1   ABG No results for input(s): PHART, HCO3 in the last 72 hours.  Invalid input(s): PCO2, PO2  Studies/Results: Ct Pelvis W Contrast  Result Date: 09/05/2018 CLINICAL DATA:  Here with ex-boyfriend. Pt has had vascular surgery with balloons to legs. Wound opened. Recent amputation. Pt is diabetic. He has taken her back to vascular center 2 days ago for wound that he is concerned about. Was placed on a second round of antibiotics. Groin wound is swollen per family. Draining yellow fluid per family. No fever. Per family, infection under the skin of the buttocks. Severe pain in this region. EXAM: CT PELVIS WITH CONTRAST TECHNIQUE: Multidetector CT imaging of the pelvis was performed using the standard protocol following the bolus administration of intravenous contrast. CONTRAST:  43mL OMNIPAQUE IOHEXOL 300 MG/ML  SOLN COMPARISON:  06/07/2018 CT abdomen and pelvis FINDINGS: Urinary Tract: Distended urinary bladder. LOWER portions of the kidneys are unremarkable. Bowel: Moderate stool burden. No bowel dilatation or bowel wall thickening. Vascular/Lymphatic: Aorto bi-iliac stent grafts. Bilateral femoral artery stents. There is dense atherosclerosis of the native vessels. Along the LEFT proximal femoral artery there is an irregular heterogeneous gas and fluid collection with rim enhancement which measures 1.7 x  7.8 x 2.5 centimeters. Small amount of air is identified within the wound at the skin, consistent with the history of draining wound. There is no evidence for pseudoaneurysm or arteriovenous fistula. Reproductive:  The uterus is present.  No adnexal mass. Other:  No ascites.  Anterior abdominal wall is unremarkable. The LEFT gluteus maximus is  enlarged and heterogeneous, measuring 5.4 centimeters, compared to 1.8 centimeters on the RIGHT. Vessels traverse the muscle and considerations include inflammation/infection, or resolving hematoma. Mass is significantly less likely given the normal appearance on the December scan. Given the heterogeneity, is difficult to determine if there is a drainable fluid collection in this region. Significant diffuse body wall edema. Musculoskeletal: No acute osseous abnormality. IMPRESSION: 1. Heterogeneous gas and fluid collection adjacent to the LEFT femoral artery, suspicious for abscess. 2. Heterogeneous enlargement of the LEFT gluteus maximus, consistent with abscess, inflammation, or resolving hematoma. Given the heterogeneity, infection/inflammation is favored. Consider ultrasound to evaluate for drainable fluid collections. 3. Distended urinary bladder. 4. Vascular stents. Electronically Signed   By: Norva Pavlov M.D.   On: 09/05/2018 15:25    Anti-infectives: Anti-infectives (From admission, onward)   Start     Dose/Rate Route Frequency Ordered Stop   09/06/18 1300  vancomycin (VANCOCIN) 500 mg in sodium chloride 0.9 % 100 mL IVPB     500 mg 100 mL/hr over 60 Minutes Intravenous Every 24 hours 09/05/18 1637     09/05/18 2000  metroNIDAZOLE (FLAGYL) IVPB 500 mg     500 mg 100 mL/hr over 60 Minutes Intravenous Every 8 hours 09/05/18 1940     09/05/18 2000  ceFEPIme (MAXIPIME) 2 g in sodium chloride 0.9 % 100 mL IVPB     2 g 200 mL/hr over 30 Minutes Intravenous Every 24 hours 09/05/18 1637     09/05/18 1945  ceFEPIme (MAXIPIME) 2 g in sodium chloride 0.9 % 100 mL IVPB  Status:  Discontinued     2 g 200 mL/hr over 30 Minutes Intravenous  Once 09/05/18 1940 09/05/18 1944   09/05/18 1945  vancomycin (VANCOCIN) IVPB 1000 mg/200 mL premix  Status:  Discontinued     1,000 mg 200 mL/hr over 60 Minutes Intravenous  Once 09/05/18 1940 09/05/18 1945   09/05/18 1130  piperacillin-tazobactam (ZOSYN) IVPB  3.375 g     3.375 g 100 mL/hr over 30 Minutes Intravenous  Once 09/05/18 1115 09/05/18 1307   09/05/18 1130  vancomycin (VANCOCIN) IVPB 1000 mg/200 mL premix     1,000 mg 200 mL/hr over 60 Minutes Intravenous  Once 09/05/18 1115 09/05/18 1410      Assessment/Plan: S/p multiple vascular interventions, presenting to the hospital with wound complications, as well as a sacral pressure sore.  There is no abscess in the left gluteal region and no evidence of tissue or skin necrosis.  There is no indication for surgical intervention at this time.  Recommend continuing antibiotics and local wound care to the sacral site.  General surgery will continue to follow.  LOS: 1 day    Duanne Guess 09/06/2018

## 2018-09-06 NOTE — Progress Notes (Signed)
PHARMACY - PHYSICIAN COMMUNICATION CRITICAL VALUE ALERT - BLOOD CULTURE IDENTIFICATION (BCID)  April [name] is an 63 y.o. female who presented to [facility] on 09/05/2018 with a chief complaint of acute infected draining femoral leg wound and possible gluteal abscess.  Assessment:  1/4 bottle positive for CoNS (include suspected source if known)  Name of physician (or Provider) Contacted: Dr. Renae Gloss   Current antibiotics: cefepime, flagyl, vancomycin  Changes to prescribed antibiotics recommended:  Continue current abx and d/c vancomycin  Results for orders placed or performed during the hospital encounter of 09/05/18  Blood Culture ID Panel (Reflexed) (Collected: 09/05/2018 12:24 PM)  Result Value Ref Range   Enterococcus species NOT DETECTED NOT DETECTED   Listeria monocytogenes NOT DETECTED NOT DETECTED   Staphylococcus species DETECTED (A) NOT DETECTED   Staphylococcus aureus (BCID) NOT DETECTED NOT DETECTED   Methicillin resistance DETECTED (A) NOT DETECTED   Streptococcus species NOT DETECTED NOT DETECTED   Streptococcus agalactiae NOT DETECTED NOT DETECTED   Streptococcus pneumoniae NOT DETECTED NOT DETECTED   Streptococcus pyogenes NOT DETECTED NOT DETECTED   Acinetobacter baumannii NOT DETECTED NOT DETECTED   Enterobacteriaceae species NOT DETECTED NOT DETECTED   Enterobacter cloacae complex NOT DETECTED NOT DETECTED   Escherichia coli NOT DETECTED NOT DETECTED   Klebsiella oxytoca NOT DETECTED NOT DETECTED   Klebsiella pneumoniae NOT DETECTED NOT DETECTED   Proteus species NOT DETECTED NOT DETECTED   Serratia marcescens NOT DETECTED NOT DETECTED   Haemophilus influenzae NOT DETECTED NOT DETECTED   Neisseria meningitidis NOT DETECTED NOT DETECTED   Pseudomonas aeruginosa NOT DETECTED NOT DETECTED   Candida albicans NOT DETECTED NOT DETECTED   Candida glabrata NOT DETECTED NOT DETECTED   Candida krusei NOT DETECTED NOT DETECTED   Candida parapsilosis NOT DETECTED NOT  DETECTED   Candida tropicalis NOT DETECTED NOT DETECTED    Ronnald Ramp 09/06/2018  4:33 PM

## 2018-09-06 NOTE — Progress Notes (Signed)
Patient ID: April [name], female   DOB: 1956/03/31, 63 y.o.   MRN: [id number]  [employer] PROGRESS NOTE  LADAIJAH BRAGGS SJG:[id number] DOB: December 20, 1955 DOA: 09/05/2018 PCP: Mickel Fuchs, MD  HPI/Subjective: Patient feeling okay.  Went back to sleep shortly after me waking her up.  As per daughter patient was not herself over the last couple days.  Objective: Vitals:   09/06/18 0450 09/06/18 1132  BP: (!) 106/49 (!) 94/53  Pulse: 98 91  Resp: 16   Temp: 97.9 F (36.6 C) 97.7 F (36.5 C)  SpO2: 100% 100%    Filed Weights   09/05/18 1056 09/05/18 1957  Weight: 39 kg 39 kg    ROS: Review of Systems  Unable to perform ROS: Acuity of condition   Exam: Physical Exam  Constitutional: She appears lethargic.  HENT:  Nose: No mucosal edema.  Mouth/Throat: No oropharyngeal exudate.  Eyes: Pupils are equal, round, and reactive to light. Conjunctivae and lids are normal.  Neck: Carotid bruit is not present. No thyromegaly present.  Cardiovascular: Regular rhythm, S1 normal, S2 normal and normal heart sounds.  Respiratory: She has no decreased breath sounds. She has no wheezes. She has no rhonchi. She has no rales.  GI: Soft. Bowel sounds are normal. There is no abdominal tenderness.  Musculoskeletal:     Right ankle: She exhibits no swelling.     Left ankle: She exhibits no swelling.  Lymphadenopathy:    She has no cervical adenopathy.  Neurological: She appears lethargic.  Skin: Skin is warm. Nails show no clubbing.  Slight erythema over the skin of the left hip but no breaks in the skin on the left buttock or left hip area.  She does have a stage II decubitus on the buttock and also open area in the left thigh from prior procedure.  Psychiatric:  Lethargic      Data Reviewed: Basic Metabolic Panel: Recent Labs  Lab 09/05/18 1426  NA 126*  K 5.0  CL 89*  CO2 27  GLUCOSE 544*  BUN 28*  CREATININE 0.83  CALCIUM 7.7*   CBC: Recent Labs  Lab  09/05/18 1141  WBC 18.4*  NEUTROABS 16.7*  HGB 13.3  HCT 42.5  MCV 96.8  PLT 302   BNP (last 3 results) Recent Labs    06/10/18 0621  BNP 811.0*     CBG: Recent Labs  Lab 09/05/18 1540 09/05/18 2231 09/06/18 0808 09/06/18 1300  GLUCAP 508* 276* 172* 113*    Recent Results (from the past 240 hour(s))  Blood culture (routine x 2)     Status: None (Preliminary result)   Collection Time: 09/05/18 11:42 AM  Result Value Ref Range Status   Specimen Description BLOOD RFA  Final   Special Requests   Final    BOTTLES DRAWN AEROBIC AND ANAEROBIC Blood Culture adequate volume   Culture   Final    NO GROWTH < 24 HOURS Performed at [facility], [address] [place], [place] [address]    Report Status PENDING  Incomplete  Blood culture (routine x 2)     Status: None (Preliminary result)   Collection Time: 09/05/18 12:24 PM  Result Value Ref Range Status   Specimen Description BLOOD BLOOD RIGHT HAND  Final   Special Requests   Final    BOTTLES DRAWN AEROBIC ONLY Blood Culture adequate volume   Culture   Final    NO GROWTH < 24 HOURS Performed at [facility], [address], [place], [place] [address]    Report Status PENDING  Incomplete     Studies: Ct Pelvis W Contrast  Result Date: 09/05/2018 CLINICAL DATA:  Here with ex-boyfriend. Pt has had vascular surgery with balloons to legs. Wound opened. Recent amputation. Pt is diabetic. He has taken her back to vascular center 2 days ago for wound that he is concerned about. Was placed on a second round of antibiotics. Groin wound is swollen per family. Draining yellow fluid per family. No fever. Per family, infection under the skin of the buttocks. Severe pain in this region. EXAM: CT PELVIS WITH CONTRAST TECHNIQUE: Multidetector CT imaging of the pelvis was performed using the standard protocol following the bolus administration of intravenous contrast. CONTRAST:  75mL OMNIPAQUE IOHEXOL 300 MG/ML  SOLN  COMPARISON:  06/07/2018 CT abdomen and pelvis FINDINGS: Urinary Tract: Distended urinary bladder. LOWER portions of the kidneys are unremarkable. Bowel: Moderate stool burden. No bowel dilatation or bowel wall thickening. Vascular/Lymphatic: Aorto bi-iliac stent grafts. Bilateral femoral artery stents. There is dense atherosclerosis of the native vessels. Along the LEFT proximal femoral artery there is an irregular heterogeneous gas and fluid collection with rim enhancement which measures 1.7 x 7.8 x 2.5 centimeters. Small amount of air is identified within the wound at the skin, consistent with the history of draining wound. There is no evidence for pseudoaneurysm or arteriovenous fistula. Reproductive:  The uterus is present.  No adnexal mass. Other:  No ascites.  Anterior abdominal wall is unremarkable. The LEFT gluteus maximus is enlarged and heterogeneous, measuring 5.4 centimeters, compared to 1.8 centimeters on the RIGHT. Vessels traverse the muscle and considerations include inflammation/infection, or resolving hematoma. Mass is significantly less likely given the normal appearance on the December scan. Given the heterogeneity, is difficult to determine if there is a drainable fluid collection in this region. Significant diffuse body wall edema. Musculoskeletal: No acute osseous abnormality. IMPRESSION: 1. Heterogeneous gas and fluid collection adjacent to the LEFT femoral artery, suspicious for abscess. 2. Heterogeneous enlargement of the LEFT gluteus maximus, consistent with abscess, inflammation, or resolving hematoma. Given the heterogeneity, infection/inflammation is favored. Consider ultrasound to evaluate for drainable fluid collections. 3. Distended urinary bladder. 4. Vascular stents. Electronically Signed   By: Norva Pavlov M.D.   On: 09/05/2018 15:25    Scheduled Meds: . aspirin  81 mg Oral Daily  . atorvastatin  40 mg Oral Daily  . collagenase   Topical Daily  . docusate sodium  100 mg  Oral BID  . ferrous sulfate  325 mg Oral BID WC  . gabapentin  100 mg Oral QHS  . insulin aspart  0-5 Units Subcutaneous QHS  . insulin aspart  0-9 Units Subcutaneous TID WC  . insulin glargine  15 Units Subcutaneous QHS  . ipratropium-albuterol  3 mL Nebulization TID  . midodrine  10 mg Oral TID WC  . mometasone-formoterol  2 puff Inhalation BID  . pantoprazole  40 mg Oral BID  . polyethylene glycol  17 g Oral Daily  . povidone-iodine   Topical BID  . protein supplement shake  11 oz Oral BID BM  . tamsulosin  0.4 mg Oral Daily  . ascorbic acid  500 mg Oral BID   Continuous Infusions: . sodium chloride 250 mL (09/06/18 0626)  . ceFEPime (MAXIPIME) IV Stopped (09/05/18 2252)  . metronidazole 500 mg (09/06/18 0627)  . vancomycin      Assessment/Plan:  1. Clinical sepsis with left hip/buttock abscess.  Seen by  general surgery and vascular surgery and no plans for intervention at this point.  Continue aggressive antibiotics with Maxipime vancomycin and Flagyl.  May be able to see if interventional radiology will drain this on Monday.  Wondering if this started from draining femoral wound. 2. Hyponatremia.  Continue to monitor closely 3. Type 2 diabetes mellitus on low-dose glargine and sliding scale 4. Stage II decubiti local wound care 5. Severe malnutrition 6. Chronic hypotension on midodrine 7. Hyperlipidemia unspecified on atorvastatin 8. Diabetic neuropathy on gabapentin 9. GERD on PPI 10. Peripheral vascular disease.  Holding Xarelto in case procedure needed  Code Status:     Code Status Orders  (From admission, onward)         Start     Ordered   09/05/18 1941  Full code  Continuous     09/05/18 1940        Code Status History    Date Active Date Inactive Code Status Order ID Comments User Context   08/21/2018 1658 08/25/2018 1932 DNR [id number]  Adrian Saran, MD ED   08/21/2018 1656 08/21/2018 1658 DNR [id number]  Adrian Saran, MD ED   07/21/2018 1302 07/26/2018 2148  DNR [id number]  Glee Arvin, NP Inpatient   07/09/2018 1730 07/21/2018 1302 Partial Code [id number]  Mayo, Allyn Kenner, MD Inpatient   06/03/2018 0621 06/13/2018 2013 Full Code [id number]  Arnaldo Natal, MD Inpatient   05/23/2018 0253 06/01/2018 2158 Full Code [id number]  Cammy Copa, MD Inpatient   05/23/2018 0138 05/23/2018 0253 DNR [id number]  Cammy Copa, MD ED   04/10/2018 1111 04/11/2018 1554 DNR [id number]  Milagros Loll, MD Inpatient   04/09/2018 1519 04/10/2018 1111 Full Code [id number]  Milagros Loll, MD Inpatient   04/09/2018 1337 04/09/2018 1519 DNR [id number]  Milagros Loll, MD Inpatient   04/09/2018 0012 04/09/2018 1337 Full Code [id number]  Oralia Manis, MD ED     Family Communication: Spoke with daughter on the phone Disposition Plan: To be determined  Consultants:  Vascular surgery  General surgery  Antibiotics:  Vancomycin  Cefepime  Flagyl  Time spent: 28 minutes  [employer]

## 2018-09-06 NOTE — Progress Notes (Signed)
Patient seen evaluated this evening.  Patient's little bit more responsive however continues to be loopy based on the nurse and the family observation.  The patient is hypertensive with a blood pressure of 92/43 map of 60 she is afebrile with a temperature of 98.2 Clinical examination the patient's left groin seems to be a little bit more indurated with ongoing drainage and tender to palpation.  Rest of her clinical examination is unchanged.  Impression this is a 63 year old female patient status post bilateral femoral endarterectomies and left below the knee amputation with left surgical site infection and possible sepsis.  I will plan to take the patient to the operating room for left groin exploration with debridement/ washout and application of VAC dressing.

## 2018-09-06 NOTE — Consult Note (Signed)
Reason for Consult: Left groin wound necrosis Referring Physician: Fidela Juneauichard Weiting, MD April [name] is an 63 y.o. female.  HPI: Patient is admitted for evaluation for sepsis.  And is a known patient to [facility].  Patient is resting comfortably and responds to commands.  Past Medical History:  Diagnosis Date  . Anxiety   . Colon polyps   . COPD (chronic obstructive pulmonary disease) (HCC)   . Diabetes mellitus without complication (HCC)   . DVT (deep venous thrombosis) (HCC)   . DVT (deep venous thrombosis) (HCC)   . History of colonic polyps   . Hyperlipidemia   . Peripheral artery disease (HCC)   . Pulmonary nodules/lesions, multiple 08/2012   NEGATIVE PET SCAN  . Retroperitoneal abscess (HCC)    history of in 04/2013  . Tobacco abuse     Past Surgical History:  Procedure Laterality Date  . AMPUTATION Left 07/24/2018   Procedure: AMPUTATION BELOW KNEE;  Surgeon: Annice Needyew, Jason S, MD;  Location: [facility] ORS;  Service: Vascular;  Laterality: Left;  . APPLICATION OF WOUND VAC Right 07/11/2018   Procedure: APPLICATION OF WOUND VAC;  Surgeon: Gwyneth RevelsFowler, Justin, DPM;  Location: [facility] ORS;  Service: Podiatry;  Laterality: Right;  . CHOLECYSTECTOMY    . COLONOSCOPY  05/2007  . ENDARTERECTOMY FEMORAL Bilateral 07/17/2018   Procedure: ENDARTERECTOMY FEMORAL - BILATERAL SFA STENT PLACEMENT - LEFT;  Surgeon: Annice Needyew, Jason S, MD;  Location: [facility] ORS;  Service: Vascular;  Laterality: Bilateral;  . IRRIGATION AND DEBRIDEMENT FOOT Bilateral 05/25/2018   Procedure: IRRIGATION AND DEBRIDEMENT FOOT application wound vac left  foot;  Surgeon: Recardo Evangelistroxler, Matthew, DPM;  Location: [facility] ORS;  Service: Podiatry;  Laterality: Bilateral;  . IRRIGATION AND DEBRIDEMENT FOOT Right 07/11/2018   Procedure: FOOT DEBRIDEMENT;  Surgeon: Gwyneth RevelsFowler, Justin, DPM;  Location: [facility] ORS;  Service: Podiatry;  Laterality: Right;  . LOWER EXTREMITY ANGIOGRAPHY Left 05/26/2018   Procedure: Lower Extremity  Angiography with possible intervention;  Surgeon: Annice Needyew, Jason S, MD;  Location: [facility] INVASIVE CV LAB;  Service: Cardiovascular;  Laterality: Left;  . LOWER EXTREMITY ANGIOGRAPHY Right 05/27/2018   Procedure: Lower Extremity Angiography;  Surgeon: Renford DillsSchnier, Gregory G, MD;  [facility];  Service: Cardiovascular;  Laterality: Right;  . LOWER EXTREMITY ANGIOGRAPHY Right 07/15/2018   Procedure: Lower Extremity Angiography;  Surgeon: Renford DillsSchnier, Gregory G, MD;  [facility] INV[facility];  Service: Cardiovascular;  Laterality: Right;    Family History  Problem Relation Age of Onset  . Brain cancer Mother     Social History:  reports that she has been smoking cigarettes. She has a 70.50 pack-year smoking history. She has never used smokeless tobacco. She reports that she does not drink alcohol or use drugs.  Allergies:  Allergies  Allergen Reactions  . Eggs Or Egg-Derived Products Diarrhea, Itching and Nausea And Vomiting    Medications: I have reviewed the patient's current medications.  Results for orders placed or performed during the hospital encounter of 09/05/18 (from the past 48 hour(s))  CBC with Differential/Platelet     Status: Abnormal   Collection Time: 09/05/18 11:41 AM  Result Value Ref Range   WBC 18.4 (H) 4.0 - 10.5 K/uL   RBC 4.39 3.87 - 5.11 MIL/uL   Hemoglobin 13.3 12.0 - 15.0 g/dL   HCT 16.142.5 09.636.0 - 04.546.0 %   MCV 96.8 80.0 - 100.0 fL   MCH 30.3 26.0 - 34.0 pg   MCHC 31.3 30.0 - 36.0 g/dL  RDW 15.4 11.5 - 15.5 %   Platelets 302 150 - 400 K/uL   nRBC 0.0 0.0 - 0.2 %   Neutrophils Relative % 92 %   Neutro Abs 16.7 (H) 1.7 - 7.7 K/uL   Lymphocytes Relative 5 %   Lymphs Abs 0.8 0.7 - 4.0 K/uL   Monocytes Relative 3 %   Monocytes Absolute 0.6 0.1 - 1.0 K/uL   Eosinophils Relative 0 %   Eosinophils Absolute 0.1 0.0 - 0.5 K/uL   Basophils Relative 0 %   Basophils Absolute 0.1 0.0 - 0.1 K/uL   Immature Granulocytes 0 %   Abs Immature Granulocytes  0.07 0.00 - 0.07 K/uL    Comment: Performed at [facility], [address] [place], [place] [address]  Blood culture (routine x 2)     Status: None (Preliminary result)   Collection Time: 09/05/18 11:42 AM  Result Value Ref Range   Specimen Description BLOOD RFA    Special Requests      BOTTLES DRAWN AEROBIC AND ANAEROBIC Blood Culture adequate volume   Culture      NO GROWTH < 24 HOURS Performed at [facility], [address], [place], [place] [address]    Report Status PENDING   Blood culture (routine x 2)     Status: None (Preliminary result)   Collection Time: 09/05/18 12:24 PM  Result Value Ref Range   Specimen Description BLOOD BLOOD RIGHT HAND    Special Requests      BOTTLES DRAWN AEROBIC ONLY Blood Culture adequate volume   Culture      NO GROWTH < 24 HOURS Performed at [facility], [address] [place], [place] [address]    Report Status PENDING   Lactic acid, plasma     Status: Abnormal   Collection Time: 09/05/18 12:25 PM  Result Value Ref Range   Lactic Acid, Venous 2.1 (HH) 0.5 - 1.9 mmol/L    Comment: CRITICAL RESULT CALLED TO, READ BACK BY AND VERIFIED WITH  GEORGIE HAHLE AT 1257 09/05/18 SDR Performed at [facility], [address] [place] [place], [place] [address]   Basic metabolic panel     Status: Abnormal   Collection Time: 09/05/18  2:26 PM  Result Value Ref Range   Sodium 126 (L) 135 - 145 mmol/L   Potassium 5.0 3.5 - 5.1 mmol/L   Chloride 89 (L) 98 - 111 mmol/L   CO2 27 22 - 32 mmol/L   Glucose, Bld 544 (HH) 70 - 99 mg/dL    Comment: CRITICAL RESULT CALLED TO, READ BACK BY AND VERIFIED WITH GEORGIE HAHLE 09/05/18 @ 1454  MLK    BUN 28 (H) 8 - 23 mg/dL   Creatinine, Ser 3.21 0.44 - 1.00 mg/dL   Calcium 7.7 (L) 8.9 - 10.3 mg/dL   GFR calc non Af Amer >60 >60 mL/min   GFR calc Af Amer >60 >60 mL/min   Anion gap 10 5 - 15    Comment: Performed at [facility], [address] [place], [place]  [address]  TSH     Status: None   Collection Time: 09/05/18  2:26 PM  Result Value Ref Range   TSH 0.691 0.350 - 4.500 uIU/mL    Comment: Performed by a 3rd Generation assay with a functional sensitivity of <=0.01 uIU/mL. Performed at [facility], [address] [place], [place] [address]   Glucose, capillary     Status: Abnormal   Collection Time: 09/05/18  3:40 PM  Result Value Ref Range   Glucose-Capillary 508 (HH) 70 - 99 mg/dL   Comment 1 Document in Chart   Lactic acid, plasma     Status: None   Collection Time: 09/05/18  3:49 PM  Result Value Ref Range   Lactic Acid, Venous 1.6 0.5 - 1.9 mmol/L    Comment: Performed at [facility], [address] [place], [place] [address]  Urinalysis, Routine w reflex microscopic     Status: Abnormal   Collection Time: 09/05/18  3:50 PM  Result Value Ref Range   Color, Urine YELLOW (A) YELLOW   APPearance TURBID (A) CLEAR   Specific Gravity, Urine 1.010 1.005 - 1.030   pH 6.0 5.0 - 8.0   Glucose, UA >=500 (A) NEGATIVE mg/dL   Hgb urine dipstick MODERATE (A) NEGATIVE   Bilirubin Urine NEGATIVE NEGATIVE   Ketones, ur NEGATIVE NEGATIVE mg/dL   Protein, ur 30 (A) NEGATIVE mg/dL   Nitrite NEGATIVE NEGATIVE   Leukocytes,Ua MODERATE (A) NEGATIVE   RBC / HPF >50 (H) 0 - 5 RBC/hpf   WBC, UA >50 (H) 0 - 5 WBC/hpf   Bacteria, UA NONE SEEN NONE SEEN   Squamous Epithelial / LPF 6-10 0 - 5   WBC Clumps PRESENT    Budding Yeast PRESENT     Comment: Performed at [facility], [address] [place], [place] [address]  Glucose, capillary     Status: Abnormal   Collection Time: 09/05/18 10:31 PM  Result Value Ref Range   Glucose-Capillary 276 (H) 70 - 99 mg/dL  Protime-INR     Status: None   Collection Time: 09/06/18  5:19 AM  Result Value Ref Range   Prothrombin Time 14.0 11.4 - 15.2 seconds   INR 1.1 0.8 - 1.2    Comment: (NOTE) INR goal varies based on device and disease states. Performed at [facility], [address] [place], [place] [address]   Procalcitonin     Status: None   Collection Time: 09/06/18  5:19 AM  Result Value Ref Range   Procalcitonin 0.27 ng/mL    Comment:        Interpretation: PCT (Procalcitonin) <= 0.5 ng/mL: Systemic infection (sepsis) is not likely. Local bacterial infection is possible. (NOTE)       Sepsis PCT Algorithm           Lower Respiratory Tract                                      Infection PCT Algorithm    ----------------------------     ----------------------------         PCT < 0.25 ng/mL                PCT < 0.10 ng/mL         Strongly encourage             Strongly discourage   discontinuation of antibiotics    initiation of antibiotics    ----------------------------     -----------------------------       PCT 0.25 - 0.50 ng/mL            PCT 0.10 - 0.25 ng/mL               OR       >80% decrease in PCT  Discourage initiation of                                            antibiotics      Encourage discontinuation           of antibiotics    ----------------------------     -----------------------------         PCT >= 0.50 ng/mL              PCT 0.26 - 0.50 ng/mL               AND        <80% decrease in PCT             Encourage initiation of                                             antibiotics       Encourage continuation           of antibiotics    ----------------------------     -----------------------------        PCT >= 0.50 ng/mL                  PCT > 0.50 ng/mL               AND         increase in PCT                  Strongly encourage                                      initiation of antibiotics    Strongly encourage escalation           of antibiotics                                     -----------------------------                                           PCT <= 0.25 ng/mL                                                 OR                                        > 80% decrease in PCT                                      Discontinue / Do not initiate  antibiotics Performed at [facility], [address] [place], [place] [address]   Glucose, capillary     Status: Abnormal   Collection Time: 09/06/18  8:08 AM  Result Value Ref Range   Glucose-Capillary 172 (H) 70 - 99 mg/dL  Glucose, capillary     Status: Abnormal   Collection Time: 09/06/18  1:00 PM  Result Value Ref Range   Glucose-Capillary 113 (H) 70 - 99 mg/dL    Ct Pelvis W Contrast  Result Date: 09/05/2018 CLINICAL DATA:  Here with ex-boyfriend. Pt has had vascular surgery with balloons to legs. Wound opened. Recent amputation. Pt is diabetic. He has taken her back to vascular center 2 days ago for wound that he is concerned about. Was placed on a second round of antibiotics. Groin wound is swollen per family. Draining yellow fluid per family. No fever. Per family, infection under the skin of the buttocks. Severe pain in this region. EXAM: CT PELVIS WITH CONTRAST TECHNIQUE: Multidetector CT imaging of the pelvis was performed using the standard protocol following the bolus administration of intravenous contrast. CONTRAST:  25mL OMNIPAQUE IOHEXOL 300 MG/ML  SOLN COMPARISON:  06/07/2018 CT abdomen and pelvis FINDINGS: Urinary Tract: Distended urinary bladder. LOWER portions of the kidneys are unremarkable. Bowel: Moderate stool burden. No bowel dilatation or bowel wall thickening. Vascular/Lymphatic: Aorto bi-iliac stent grafts. Bilateral femoral artery stents. There is dense atherosclerosis of the native vessels. Along the LEFT proximal femoral artery there is an irregular heterogeneous gas and fluid collection with rim enhancement which measures 1.7 x 7.8 x 2.5 centimeters. Small amount of air is identified within the wound at the skin, consistent with the history of draining wound. There is no evidence for pseudoaneurysm or arteriovenous fistula. Reproductive:  The uterus is present.   No adnexal mass. Other:  No ascites.  Anterior abdominal wall is unremarkable. The LEFT gluteus maximus is enlarged and heterogeneous, measuring 5.4 centimeters, compared to 1.8 centimeters on the RIGHT. Vessels traverse the muscle and considerations include inflammation/infection, or resolving hematoma. Mass is significantly less likely given the normal appearance on the December scan. Given the heterogeneity, is difficult to determine if there is a drainable fluid collection in this region. Significant diffuse body wall edema. Musculoskeletal: No acute osseous abnormality. IMPRESSION: 1. Heterogeneous gas and fluid collection adjacent to the LEFT femoral artery, suspicious for abscess. 2. Heterogeneous enlargement of the LEFT gluteus maximus, consistent with abscess, inflammation, or resolving hematoma. Given the heterogeneity, infection/inflammation is favored. Consider ultrasound to evaluate for drainable fluid collections. 3. Distended urinary bladder. 4. Vascular stents. Electronically Signed   By: Norva Pavlov M.D.   On: 09/05/2018 15:25    Review of Systems  All other systems reviewed and are negative.  Blood pressure (!) 94/53, pulse 91, temperature 97.7 F (36.5 C), resp. rate 16, height 5\' 2"  (1.575 m), weight 39 kg, SpO2 100 %. Physical Exam  Nursing note and vitals reviewed. Patient is resting, however she is aroused and responds to commands.  Lungs are clear bilaterally heart regular rhythm.  Abdomen is unremarkable, scaphoid, positive bowel sounds.  Left groin incision has a distal area of necrosis measuring about a centimeter in diameter by half a centimeter in depth.  No smell is noted.  Dressings have minimal drainage.  Wound culture is ordered.  The right groin incision is healing well.   Extremities unremarkable. Lower extremities: Left below the knee amputation incision is healing well.  The right plantar foot wound is dry and healed.  Assessment/Plan: This is a 63 year old  lady with severe peripheral arterial occlusive disease status post bilateral femoral endarterectomies and left below the knee amputation.  Left groin small surgical site infection with necrosis.  I would recommend application of Betadine to the wound and I will reevaluate in the morning for possible bedside debridement.  Continue with Betadine to the right plantar dry wound. Louisa Second 09/06/2018, 1:06 PM

## 2018-09-07 ENCOUNTER — Encounter
Admission: EM | Disposition: A | Discharge status: Home Health | Payer: Self-pay | Source: Home / Self Care | Attending: Internal Medicine

## 2018-09-07 ENCOUNTER — Inpatient Hospital Stay: Payer: Medicare Other | Admitting: Anesthesiology

## 2018-09-07 ENCOUNTER — Inpatient Hospital Stay: Payer: Medicare Other

## 2018-09-07 HISTORY — PX: FEMORAL ARTERY EXPLORATION: SHX5160

## 2018-09-07 HISTORY — PX: WOUND EXPLORATION: SHX6188

## 2018-09-07 HISTORY — PX: APPLICATION OF WOUND VAC: SHX5189

## 2018-09-07 LAB — BASIC METABOLIC PANEL
Anion gap: 6 (ref 5–15)
BUN: 24 mg/dL — AB (ref 8–23)
CO2: 24 mmol/L (ref 22–32)
Calcium: 7.6 mg/dL — ABNORMAL LOW (ref 8.9–10.3)
Chloride: 101 mmol/L (ref 98–111)
Creatinine, Ser: 0.64 mg/dL (ref 0.44–1.00)
GFR calc Af Amer: >60 mL/min (ref >60)
GFR calc non Af Amer: >60 mL/min (ref >60)
Glucose, Bld: 152 mg/dL — ABNORMAL HIGH (ref 70–99)
Potassium: 4.5 mmol/L (ref 3.5–5.1)
SODIUM: 131 mmol/L — AB (ref 135–145)

## 2018-09-07 LAB — TYPE AND SCREEN
ABO/RH(D): B POS
Antibody Screen: NEGATIVE
Unit division: 0
Unit division: 0

## 2018-09-07 LAB — BPAM RBC
Blood Product Expiration Date: 202003312359
Blood Product Expiration Date: 202003312359
ISSUE DATE / TIME: 202003081417
ISSUE DATE / TIME: 202003081417
Unit Type and Rh: 7300
Unit Type and Rh: 7300

## 2018-09-07 LAB — GLUCOSE, CAPILLARY
GLUCOSE-CAPILLARY: 106 mg/dL — AB (ref 70–99)
Glucose-Capillary: 120 mg/dL — ABNORMAL HIGH (ref 70–99)
Glucose-Capillary: 118 mg/dL — ABNORMAL HIGH (ref 70–99)
Glucose-Capillary: 119 mg/dL — ABNORMAL HIGH (ref 70–99)

## 2018-09-07 LAB — CBC
HCT: 28.6 % — ABNORMAL LOW (ref 36.0–46.0)
Hemoglobin: 8.9 g/dL — ABNORMAL LOW (ref 12.0–15.0)
MCH: 30.3 pg (ref 26.0–34.0)
MCHC: 31.1 g/dL (ref 30.0–36.0)
MCV: 97.3 fL (ref 80.0–100.0)
Platelets: 188 10*3/uL (ref 150–400)
RBC: 2.94 MIL/uL — ABNORMAL LOW (ref 3.87–5.11)
RDW: 15.9 % — ABNORMAL HIGH (ref 11.5–15.5)
WBC: 14.4 10*3/uL — AB (ref 4.0–10.5)
nRBC: 0 % (ref 0.0–0.2)

## 2018-09-07 LAB — PREPARE RBC (CROSSMATCH)

## 2018-09-07 SURGERY — DEBRIDEMENT, WOUND
Anesthesia: General | Laterality: Left

## 2018-09-07 SURGERY — APPLICATION, WOUND VAC
Anesthesia: General | Laterality: Left

## 2018-09-07 MED ORDER — PROPOFOL 500 MG/50ML IV EMUL
INTRAVENOUS | Status: DC | PRN
  Administered 2018-09-07: 80 ug/kg/min via INTRAVENOUS

## 2018-09-07 MED ORDER — KETAMINE HCL 50 MG/ML IJ SOLN
INTRAMUSCULAR | Status: AC
  Filled 2018-09-07: qty 10

## 2018-09-07 MED ORDER — CEFAZOLIN SODIUM-DEXTROSE 2-4 GM/100ML-% IV SOLN
2.0000 g | Freq: Three times a day (TID) | INTRAVENOUS | Status: DC
  Filled 2018-09-07 (×2): qty 100

## 2018-09-07 MED ORDER — DOCUSATE SODIUM 100 MG PO CAPS
100.0000 mg | ORAL_CAPSULE | Freq: Every day | ORAL | Status: DC
  Administered 2018-09-08 – 2018-09-13 (×5): 100 mg via ORAL
  Filled 2018-09-07 (×5): qty 1

## 2018-09-07 MED ORDER — ONDANSETRON HCL 4 MG/2ML IJ SOLN
INTRAMUSCULAR | Status: DC | PRN
  Administered 2018-09-07: 4 mg via INTRAVENOUS

## 2018-09-07 MED ORDER — SODIUM CHLORIDE 0.9 % IV SOLN: 500.0000 mL | Freq: Once | INTRAVENOUS | Status: DC | PRN

## 2018-09-07 MED ORDER — MAGNESIUM SULFATE 2 GM/50ML IV SOLN: 2.0000 g | Freq: Every day | INTRAVENOUS | Status: DC | PRN

## 2018-09-07 MED ORDER — CHLORHEXIDINE GLUCONATE CLOTH 2 % EX PADS
6.0000 | MEDICATED_PAD | Freq: Once | CUTANEOUS | Status: AC
  Administered 2018-09-07: 6 via TOPICAL

## 2018-09-07 MED ORDER — POTASSIUM CHLORIDE CRYS ER 20 MEQ PO TBCR: 20.0000 meq | EXTENDED_RELEASE_TABLET | Freq: Every day | ORAL | Status: DC | PRN

## 2018-09-07 MED ORDER — DEXMEDETOMIDINE HCL IN NACL 80 MCG/20ML IV SOLN
INTRAVENOUS | Status: AC
  Filled 2018-09-07: qty 20

## 2018-09-07 MED ORDER — METOPROLOL TARTRATE 5 MG/5ML IV SOLN
2.0000 mg | INTRAVENOUS | Status: DC | PRN
  Administered 2018-09-09: 5 mg via INTRAVENOUS
  Filled 2018-09-07: qty 5

## 2018-09-07 MED ORDER — PHENOL 1.4 % MT LIQD
1.0000 | OROMUCOSAL | Status: DC | PRN
  Filled 2018-09-07: qty 177

## 2018-09-07 MED ORDER — LIDOCAINE HCL 1 % IJ SOLN
INTRAMUSCULAR | Status: DC | PRN
  Administered 2018-09-07: 20 mL

## 2018-09-07 MED ORDER — ENSURE MAX PROTEIN PO LIQD
11.0000 [oz_av] | Freq: Two times a day (BID) | ORAL | Status: DC
  Administered 2018-09-08 – 2018-09-13 (×9): 11 [oz_av] via ORAL
  Filled 2018-09-07: qty 330

## 2018-09-07 MED ORDER — HYDRALAZINE HCL 20 MG/ML IJ SOLN
5.0000 mg | INTRAMUSCULAR | Status: DC | PRN
  Administered 2018-09-07: 5 mg via INTRAVENOUS
  Filled 2018-09-07: qty 1

## 2018-09-07 MED ORDER — GUAIFENESIN-DM 100-10 MG/5ML PO SYRP: 15.0000 mL | ORAL_SOLUTION | ORAL | Status: DC | PRN

## 2018-09-07 MED ORDER — KETAMINE HCL 100 MG/ML IJ SOLN
INTRAMUSCULAR | Status: DC | PRN
  Administered 2018-09-07 (×2): 25 mg via INTRAVENOUS

## 2018-09-07 MED ORDER — DEXTROSE-NACL 5-0.45 % IV SOLN
INTRAVENOUS | Status: DC
  Administered 2018-09-07 – 2018-09-08 (×2): via INTRAVENOUS

## 2018-09-07 MED ORDER — OCUVITE-LUTEIN PO CAPS
1.0000 | ORAL_CAPSULE | Freq: Every day | ORAL | Status: DC
  Administered 2018-09-08 – 2018-09-13 (×5): 1 via ORAL
  Filled 2018-09-07 (×6): qty 1

## 2018-09-07 MED ORDER — MIDAZOLAM HCL 2 MG/2ML IJ SOLN
INTRAMUSCULAR | Status: DC | PRN
  Administered 2018-09-07: 1 mg via INTRAVENOUS

## 2018-09-07 MED ORDER — HEPARIN SODIUM (PORCINE) 1000 UNIT/ML IJ SOLN
INTRAMUSCULAR | Status: DC | PRN
  Administered 2018-09-07: 3000 [IU] via INTRAVENOUS
  Administered 2018-09-07 (×3): 500 [IU] via INTRAVENOUS

## 2018-09-07 MED ORDER — MIDAZOLAM HCL 2 MG/2ML IJ SOLN
INTRAMUSCULAR | Status: AC
  Filled 2018-09-07: qty 2

## 2018-09-07 MED ORDER — PANTOPRAZOLE SODIUM 40 MG PO TBEC
40.0000 mg | DELAYED_RELEASE_TABLET | Freq: Every day | ORAL | Status: DC
  Administered 2018-09-08 – 2018-09-13 (×5): 40 mg via ORAL
  Filled 2018-09-07 (×5): qty 1

## 2018-09-07 MED ORDER — ACETAMINOPHEN 325 MG PO TABS
325.0000 mg | ORAL_TABLET | ORAL | Status: DC | PRN
  Administered 2018-09-08 – 2018-09-11 (×2): 650 mg via ORAL
  Filled 2018-09-07 (×2): qty 2

## 2018-09-07 MED ORDER — PROPOFOL 500 MG/50ML IV EMUL
INTRAVENOUS | Status: AC
  Filled 2018-09-07: qty 50

## 2018-09-07 MED ORDER — ACETAMINOPHEN 650 MG RE SUPP: 325.0000 mg | RECTAL | Status: DC | PRN

## 2018-09-07 MED ORDER — LABETALOL HCL 5 MG/ML IV SOLN: 10.0000 mg | INTRAVENOUS | Status: DC | PRN

## 2018-09-07 MED ORDER — PROPOFOL 10 MG/ML IV BOLUS
INTRAVENOUS | Status: AC
  Filled 2018-09-07: qty 20

## 2018-09-07 MED ORDER — ENOXAPARIN SODIUM 40 MG/0.4ML ~~LOC~~ SOLN
40.0000 mg | SUBCUTANEOUS | Status: DC
  Administered 2018-09-08: 40 mg via SUBCUTANEOUS
  Filled 2018-09-07: qty 0.4

## 2018-09-07 MED ORDER — CEFAZOLIN SODIUM-DEXTROSE 2-4 GM/100ML-% IV SOLN
2.0000 g | Freq: Three times a day (TID) | INTRAVENOUS | Status: AC
  Administered 2018-09-07 – 2018-09-08 (×2): 2 g via INTRAVENOUS
  Filled 2018-09-07 (×2): qty 100

## 2018-09-07 MED ORDER — SODIUM CHLORIDE 0.9 % IV BOLUS
250.0000 mL | Freq: Once | INTRAVENOUS | Status: AC
  Administered 2018-09-07: 250 mL via INTRAVENOUS

## 2018-09-07 MED ORDER — ALUM & MAG HYDROXIDE-SIMETH 200-200-20 MG/5ML PO SUSP: 15.0000 mL | ORAL | Status: DC | PRN

## 2018-09-07 MED ORDER — DEXMEDETOMIDINE HCL 200 MCG/2ML IV SOLN
INTRAVENOUS | Status: DC | PRN
  Administered 2018-09-07 (×4): 8 ug via INTRAVENOUS

## 2018-09-07 MED ORDER — VANCOMYCIN HCL 500 MG IV SOLR
500.0000 mg | INTRAVENOUS | Status: DC
  Administered 2018-09-07: 500 mg via INTRAVENOUS
  Filled 2018-09-07: qty 500

## 2018-09-07 MED ORDER — NITROGLYCERIN FOR UTERINE RELAXATION OPTIME 200MCG/ML
INTRAVENOUS | Status: DC | PRN
  Administered 2018-09-07: .5 mL via INTRAVENOUS

## 2018-09-07 MED ORDER — PHENYLEPHRINE HCL 10 MG/ML IJ SOLN
INTRAMUSCULAR | Status: DC | PRN
  Administered 2018-09-07: 100 ug via INTRAVENOUS

## 2018-09-07 SURGICAL SUPPLY — 52 items
BOOT SUTURE AID YELLOW STND (SUTURE) ×4 IMPLANT
BRUSH SCRUB EZ  4% CHG (MISCELLANEOUS) ×2
BRUSH SCRUB EZ 4% CHG (MISCELLANEOUS) ×2 IMPLANT
CANISTER SUCT 1200ML W/VALVE (MISCELLANEOUS) ×4 IMPLANT
CANISTER WOUND CARE 500ML ATS (WOUND CARE) ×8 IMPLANT
CHLORAPREP W/TINT 26 (MISCELLANEOUS) ×4 IMPLANT
COVER WAND RF STERILE (DRAPES) ×4 IMPLANT
DRAPE INCISE IOBAN 66X45 STRL (DRAPES) ×4 IMPLANT
DRAPE LAPAROTOMY 100X77 ABD (DRAPES) ×4 IMPLANT
DRSG VAC ATS MED SENSATRAC (GAUZE/BANDAGES/DRESSINGS) ×4 IMPLANT
ELECT CAUTERY BLADE 6.4 (BLADE) ×4 IMPLANT
ELECT REM PT RETURN 9FT ADLT (ELECTROSURGICAL) ×4
ELECTRODE REM PT RTRN 9FT ADLT (ELECTROSURGICAL) ×2 IMPLANT
GLOVE BIO SURGEON STRL SZ7 (GLOVE) ×4 IMPLANT
GOWN STRL REUS W/ TWL LRG LVL3 (GOWN DISPOSABLE) ×2 IMPLANT
GOWN STRL REUS W/ TWL XL LVL3 (GOWN DISPOSABLE) ×2 IMPLANT
GOWN STRL REUS W/TWL LRG LVL3 (GOWN DISPOSABLE) ×2
GOWN STRL REUS W/TWL XL LVL3 (GOWN DISPOSABLE) ×2
IV NS 1000ML (IV SOLUTION) ×2
IV NS 1000ML BAXH (IV SOLUTION) ×2 IMPLANT
KIT DRSG VAC SLVR GRANUFM (MISCELLANEOUS) ×4 IMPLANT
KIT TURNOVER KIT A (KITS) ×4 IMPLANT
LABEL OR SOLS (LABEL) ×4 IMPLANT
LOOP RED MAXI  1X406MM (MISCELLANEOUS) ×2
LOOP VESSEL MAXI 1X406 RED (MISCELLANEOUS) ×2 IMPLANT
LOOP VESSEL MINI 0.8X406 BLUE (MISCELLANEOUS) ×4 IMPLANT
LOOPS BLUE MINI 0.8X406MM (MISCELLANEOUS) ×4
NS IRRIG 1000ML POUR BTL (IV SOLUTION) ×4 IMPLANT
NS IRRIG 500ML POUR BTL (IV SOLUTION) ×4 IMPLANT
PACK BASIN MAJOR ARMC (MISCELLANEOUS) ×4 IMPLANT
PACK BASIN MINOR ARMC (MISCELLANEOUS) ×4 IMPLANT
PACK EXTREMITY ARMC (MISCELLANEOUS) IMPLANT
PACK UNIVERSAL I (CUSTOM PROCEDURE TRAY) ×4 IMPLANT
PAD PREP 24X41 OB/GYN DISP (PERSONAL CARE ITEMS) ×4 IMPLANT
PULSAVAC PLUS IRRIG FAN TIP (DISPOSABLE) ×4
SOL PREP PVP 2OZ (MISCELLANEOUS) ×4
SOLUTION PREP PVP 2OZ (MISCELLANEOUS) ×2 IMPLANT
SPONGE KITTNER 5P (MISCELLANEOUS) ×8 IMPLANT
SPONGE LAP 18X18 RF (DISPOSABLE) ×4 IMPLANT
STAPLER SKIN PROX 35W (STAPLE) ×4 IMPLANT
SUCTION FRAZIER HANDLE 10FR (MISCELLANEOUS) ×2
SUCTION TUBE FRAZIER 10FR DISP (MISCELLANEOUS) ×2 IMPLANT
SUT ETHILON 4-0 (SUTURE) ×2
SUT ETHILON 4-0 FS2 18XMFL BLK (SUTURE) ×2
SUT PROLENE 6 0 BV (SUTURE) ×28 IMPLANT
SUT VIC AB 3-0 SH 27 (SUTURE) ×4
SUT VIC AB 3-0 SH 27X BRD (SUTURE) ×4 IMPLANT
SUTURE ETHLN 4-0 FS2 18XMF BLK (SUTURE) ×2 IMPLANT
SWAB CULTURE AMIES ANAERIB BLU (MISCELLANEOUS) ×8 IMPLANT
SYR BULB 3OZ (MISCELLANEOUS) ×4 IMPLANT
SYR BULB IRRIG 60ML STRL (SYRINGE) ×4 IMPLANT
TIP FAN IRRIG PULSAVAC PLUS (DISPOSABLE) ×2 IMPLANT

## 2018-09-07 NOTE — Progress Notes (Signed)
Initial Nutrition Assessment  DOCUMENTATION CODES:   Severe malnutrition in context of chronic illness, Underweight  INTERVENTION:  Premier Protein no longer on formulary. Will change to its replacement Ensure Max Protein po BID, each supplement provides 150 kcal and 30 grams of protein.  Provide Ocuvite daily for wound healing (provides zinc, vitamin A, vitamin C, Vitamin E, copper, and selenium).  Provide vitamin C 500 mg po BID.  NUTRITION DIAGNOSIS:   Severe Malnutrition related to chronic illness(COPD, poorly controlled DM, increased nutrient needs from stage III pressure ulcer) as evidenced by severe fat depletion, severe muscle depletion.  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   Met with patient at bedside. She is well-known to our service from multiple previous admissions. Today she was sleepy and when she did wake up was only asking for pain medications and would not answer RD questions. Unable to obtain any interval nutrition/weight history from patient. She is currently NPO. Went to MRI this AM and going to OR with vascular surgery later today.  Very difficult to trend patient's weight as it has fluctuated significantly over time. She was 42.2 kg on 05/17/2017, 31.8 kg on 04/09/2018, 52.1 kg on 05/30/2018, 46.9 kg on 07/25/2018, 38.1 kg on 08/14/2018, and is currently 39 kg (85.98 lbs). Some of these weights are likely inaccurate, but it is difficult to tell what was a true weight.  Medications reviewed and include: Colace 100 mg BID,  ferrous sulfate 325 mg BID, gabapentin, Novolog 0-9 units TID, Novolog 0-5 units QHS, Lantus 15 units QHS, Protonix 40 mg BID, Miralax, Flomax, vitamin C 500 mg BID, cefepime, Flagyl, vancomycin.  Labs reviewed: CBG 120, Sodium 131, BUN 24.  NUTRITION - FOCUSED PHYSICAL EXAM:    Most Recent Value  Orbital Region  Moderate depletion  Upper Arm Region  Severe depletion  Thoracic and Lumbar Region  Severe depletion  Buccal Region  Severe depletion  Temple Region  Moderate depletion  Clavicle Bone Region  Severe depletion  Clavicle and Acromion Bone Region  Severe depletion  Scapular Bone Region  Unable to assess  Dorsal Hand  Severe depletion  Patellar Region  Severe depletion  Anterior Thigh Region  Severe depletion  Posterior Calf Region  Severe depletion  Edema (RD Assessment)  Mild  Hair  Reviewed  Eyes  Unable to assess  Mouth  Unable to assess  Skin  Reviewed  Nails  Reviewed     Diet Order:   Diet Order            Diet NPO time specified  Diet effective midnight             EDUCATION NEEDS:   Not appropriate for education at this time  Skin:  Skin Assessment: Skin Integrity Issues:(infected left groin incision site; stage III to sacrum)  Last BM:  09/03/2018 per chart  Height:   Ht Readings from Last 1 Encounters:  09/05/18 5' 2"  (1.575 m)   Weight:   Wt Readings from Last 1 Encounters:  09/05/18 39 kg   Ideal Body Weight:  37 kg(adjusted for BKA)  BMI:  Body mass index is 15.73 kg/m.  Estimated Nutritional Needs:   Kcal:  1200-1400  Protein:  60-70 grams  Fluid:  1.2-1.4 L/day  Willey Blade, MS, RD, LDN Office: [phone] Pager: [phone] After Hours/Weekend Pager: [phone]

## 2018-09-07 NOTE — Anesthesia Post-op Follow-up Note (Signed)
Anesthesia QCDR form completed.        

## 2018-09-07 NOTE — Anesthesia Procedure Notes (Addendum)
Procedure Name: MAC Date/Time: 09/07/2018 1:49 PM Performed by: Aline Brochure, CRNA Pre-anesthesia Checklist: Patient identified, Emergency Drugs available, Suction available and Patient being monitored Patient Re-evaluated:Patient Re-evaluated prior to induction Oxygen Delivery Method: Simple face mask Induction Type: IV induction Airway Equipment and Method: Oral airway Placement Confirmation: positive ETCO2

## 2018-09-07 NOTE — Anesthesia Postprocedure Evaluation (Signed)
Anesthesia Post Note  Patient: April [name]  Procedure(s) Performed: APPLICATION OF WOUND VAC (Left ) WOUND EXPLORATION (Left ) FEMORAL ARTERY EXPLORATION AND REVISION SAPHENOUS VEIN HARVEST FROM RIGHT LEG  Patient location during evaluation: PACU Anesthesia Type: General Level of consciousness: awake and alert Pain management: pain level controlled Vital Signs Assessment: post-procedure vital signs reviewed and stable Respiratory status: spontaneous breathing, nonlabored ventilation, respiratory function stable and patient connected to nasal cannula oxygen Cardiovascular status: blood pressure returned to baseline and stable Postop Assessment: no apparent nausea or vomiting Anesthetic complications: no     Last Vitals:  Vitals:   09/07/18 1210 09/07/18 1715  BP: 100/74 (!) 114/58  Pulse: (!) 101 88  Resp: 18 12  Temp: 36.8 C   SpO2: 100% 100%    Last Pain:  Vitals:   09/07/18 1210  TempSrc: Oral  PainSc:                  Yevette Edwards

## 2018-09-07 NOTE — Progress Notes (Signed)
Day of Surgery   Subjective/Chief Complaint: Patient lethargic, OR today for right groin exploration.    Objective: Vital signs in last 24 hours: Temp:  [97.7 F (36.5 C)-99 F (37.2 C)] 99 F (37.2 C) (03/08 0544) Pulse Rate:  [87-95] 95 (03/08 0544) Resp:  [16-18] 18 (03/08 0544) BP: (92-107)/(43-54) 107/54 (03/08 0544) SpO2:  [100 %] 100 % (03/08 0544) Last BM Date: 09/03/18  Intake/Output from previous day: 03/07 0701 - 03/08 0700 In: 1012.6 [P.O.:120; I.V.:476.2; IV Piggyback:416.4] Out: 550 [Urine:550] Intake/Output this shift: No intake/output data recorded.  General appearance: alert, cooperative, moderate distress and slowed mentation Head: Normocephalic, without obvious abnormality, atraumatic Eyes: conjunctivae/corneas clear. PERRL, EOM's intact. Fundi benign. Resp: clear to auscultation bilaterally Extremities: no edema, redness or tenderness in the calves or thighs Pulses: right foot none palpable, doppler signals only. The left Incision/Wound: draining. Tender to palpation.   Lab Results:  Recent Labs    09/05/18 1141 09/07/18 0431  WBC 18.4* 14.4*  HGB 13.3 8.9*  HCT 42.5 28.6*  PLT 302 188   BMET Recent Labs    09/05/18 1426 09/07/18 0431  NA 126* 131*  K 5.0 4.5  CL 89* 101  CO2 27 24  GLUCOSE 544* 152*  BUN 28* 24*  CREATININE 0.83 0.64  CALCIUM 7.7* 7.6*   PT/INR Recent Labs    09/06/18 0519  LABPROT 14.0  INR 1.1   ABG No results for input(s): PHART, HCO3 in the last 72 hours.  Invalid input(s): PCO2, PO2  Studies/Results: Ct Pelvis W Contrast  Result Date: 09/05/2018 CLINICAL DATA:  Here with ex-boyfriend. Pt has had vascular surgery with balloons to legs. Wound opened. Recent amputation. Pt is diabetic. He has taken her back to vascular center 2 days ago for wound that he is concerned about. Was placed on a [name] round of antibiotics. Groin wound is swollen per family. Draining yellow fluid per family. No fever. Per family,  infection under the skin of the buttocks. Severe pain in this region. EXAM: CT PELVIS WITH CONTRAST TECHNIQUE: Multidetector CT imaging of the pelvis was performed using the standard protocol following the bolus administration of intravenous contrast. CONTRAST:  6mL OMNIPAQUE IOHEXOL 300 MG/ML  SOLN COMPARISON:  06/07/2018 CT abdomen and pelvis FINDINGS: Urinary Tract: Distended urinary bladder. LOWER portions of the kidneys are unremarkable. Bowel: Moderate stool burden. No bowel dilatation or bowel wall thickening. Vascular/Lymphatic: Aorto bi-iliac stent grafts. Bilateral femoral artery stents. There is dense atherosclerosis of the native vessels. Along the LEFT proximal femoral artery there is an irregular heterogeneous gas and fluid collection with rim enhancement which measures 1.7 x 7.8 x 2.5 centimeters. Small amount of air is identified within the wound at the skin, consistent with the history of draining wound. There is no evidence for pseudoaneurysm or arteriovenous fistula. Reproductive:  The uterus is present.  No adnexal mass. Other:  No ascites.  Anterior abdominal wall is unremarkable. The LEFT gluteus maximus is enlarged and heterogeneous, measuring 5.4 centimeters, compared to 1.8 centimeters on the RIGHT. Vessels traverse the muscle and considerations include inflammation/infection, or resolving hematoma. Mass is significantly less likely given the normal appearance on the December scan. Given the heterogeneity, is difficult to determine if there is a drainable fluid collection in this region. Significant diffuse body wall edema. Musculoskeletal: No acute osseous abnormality. IMPRESSION: 1. Heterogeneous gas and fluid collection adjacent to the LEFT femoral artery, suspicious for abscess. 2. Heterogeneous enlargement of the LEFT gluteus maximus, consistent with abscess, inflammation, or  resolving hematoma. Given the heterogeneity, infection/inflammation is favored. Consider ultrasound to evaluate  for drainable fluid collections. 3. Distended urinary bladder. 4. Vascular stents. Electronically Signed   By: Norva Pavlov M.D.   On: 09/05/2018 15:25   Mr Pelvis Wo Contrast  Result Date: 09/07/2018 CLINICAL DATA:  Evaluate gluteal fluid collection. Recent bilateral femoral endarterectomies with left groin infection. EXAM: MRI PELVIS WITHOUT CONTRAST TECHNIQUE: Multiplanar multisequence MR imaging of the pelvis was performed. No intravenous contrast was administered. COMPARISON:  CT pelvis dated September 05, 2018. FINDINGS: Urinary Tract: No abnormality visualized. Foley catheter within the decompressed bladder. Bowel:  Unremarkable visualized pelvic bowel loops. Vascular/Lymphatic: 2.6 x 3.2 x 5.0 cm heterogeneous fluid collection adjacent to the distal left common femoral artery, draining to the skin. No enlarged pelvic lymph nodes. Reproductive:  No mass or other significant abnormality. Other:  Small amount of presacral and pelvic fluid. Musculoskeletal: No suspicious marrow signal abnormality. No hip joint effusion. No acute fracture or dislocation. Prominent asymmetric enlargement of the left gluteus maximus muscle which is edematous and demonstrates patchy areas of T1 hyperintensity. There is some ill-defined fluid within the muscle, but no discrete drainable fluid collection. There is also edema within the proximal left gluteus minimus and medius muscles, and the left adductor musculature. Diffuse anasarca with slightly asymmetric soft tissue swelling of the left proximal thigh. Small midline sacral decubitus ulcer. IMPRESSION: 1. Enlarged, edematous left gluteus maximus muscle with areas of patchy T1 hyperintensity, likely reflecting a combination of intramuscular hemorrhage and myositis. No discrete abscess or hematoma. 2. 5.0 cm heterogeneous fluid collection adjacent to the distal left femoral artery, concerning for abscess. 3. Small midline sacral decubitus ulcer without evidence of underlying  osteomyelitis. 4. Diffuse anasarca with asymmetric soft tissue swelling of the left proximal thigh, which could reflect superimposed cellulitis. Correlate with physical exam. Electronically Signed   By: Obie Dredge M.D.   On: 09/07/2018 10:53    Anti-infectives: Anti-infectives (From admission, onward)   Start     Dose/Rate Route Frequency Ordered Stop   09/07/18 1500  vancomycin (VANCOCIN) 500 mg in sodium chloride 0.9 % 100 mL IVPB     500 mg 100 mL/hr over 60 Minutes Intravenous Every 24 hours 09/07/18 0727     09/06/18 1300  vancomycin (VANCOCIN) 500 mg in sodium chloride 0.9 % 100 mL IVPB  Status:  Discontinued     500 mg 100 mL/hr over 60 Minutes Intravenous Every 24 hours 09/05/18 1637 09/06/18 1630   09/05/18 2000  metroNIDAZOLE (FLAGYL) IVPB 500 mg     500 mg 100 mL/hr over 60 Minutes Intravenous Every 8 hours 09/05/18 1940     09/05/18 2000  ceFEPIme (MAXIPIME) 2 g in sodium chloride 0.9 % 100 mL IVPB     2 g 200 mL/hr over 30 Minutes Intravenous Every 24 hours 09/05/18 1637     09/05/18 1945  ceFEPIme (MAXIPIME) 2 g in sodium chloride 0.9 % 100 mL IVPB  Status:  Discontinued     2 g 200 mL/hr over 30 Minutes Intravenous  Once 09/05/18 1940 09/05/18 1944   09/05/18 1945  vancomycin (VANCOCIN) IVPB 1000 mg/200 mL premix  Status:  Discontinued     1,000 mg 200 mL/hr over 60 Minutes Intravenous  Once 09/05/18 1940 09/05/18 1945   09/05/18 1130  piperacillin-tazobactam (ZOSYN) IVPB 3.375 g     3.375 g 100 mL/hr over 30 Minutes Intravenous  Once 09/05/18 1115 09/05/18 1307   09/05/18 1130  vancomycin (VANCOCIN) IVPB  1000 mg/200 mL premix     1,000 mg 200 mL/hr over 60 Minutes Intravenous  Once 09/05/18 1115 09/05/18 1410      Assessment/Plan: s/p Procedure(s): APPLICATION OF WOUND VAC (Left) WOUND EXPLORATION (Left)   LOS: 2 days    April [name] 09/07/2018

## 2018-09-07 NOTE — Progress Notes (Signed)
PHARMACY - PHYSICIAN COMMUNICATION CRITICAL VALUE ALERT - BLOOD CULTURE IDENTIFICATION (BCID)  April [name] is an 63 y.o. female who presented to [facility] on 09/05/2018 with a chief complaint of acute infected draining femoral leg wound and possible gluteal abscess.  Assessment:  1/4 bottle positive for CoNS (include suspected source if known)  Name of physician (or Provider) Contacted: Dr. Renae Gloss   Current antibiotics: cefepime, flagyl, vancomycin  Changes to prescribed antibiotics recommended:  Continue current abx and restart vancomycin  Results for orders placed or performed during the hospital encounter of 09/05/18  Blood Culture ID Panel (Reflexed) (Collected: 09/05/2018 12:24 PM)  Result Value Ref Range   Enterococcus species NOT DETECTED NOT DETECTED   Listeria monocytogenes NOT DETECTED NOT DETECTED   Staphylococcus species DETECTED (A) NOT DETECTED   Staphylococcus aureus (BCID) NOT DETECTED NOT DETECTED   Methicillin resistance DETECTED (A) NOT DETECTED   Streptococcus species NOT DETECTED NOT DETECTED   Streptococcus agalactiae NOT DETECTED NOT DETECTED   Streptococcus pneumoniae NOT DETECTED NOT DETECTED   Streptococcus pyogenes NOT DETECTED NOT DETECTED   Acinetobacter baumannii NOT DETECTED NOT DETECTED   Enterobacteriaceae species NOT DETECTED NOT DETECTED   Enterobacter cloacae complex NOT DETECTED NOT DETECTED   Escherichia coli NOT DETECTED NOT DETECTED   Klebsiella oxytoca NOT DETECTED NOT DETECTED   Klebsiella pneumoniae NOT DETECTED NOT DETECTED   Proteus species NOT DETECTED NOT DETECTED   Serratia marcescens NOT DETECTED NOT DETECTED   Haemophilus influenzae NOT DETECTED NOT DETECTED   Neisseria meningitidis NOT DETECTED NOT DETECTED   Pseudomonas aeruginosa NOT DETECTED NOT DETECTED   Candida albicans NOT DETECTED NOT DETECTED   Candida glabrata NOT DETECTED NOT DETECTED   Candida krusei NOT DETECTED NOT DETECTED   Candida parapsilosis NOT DETECTED  NOT DETECTED   Candida tropicalis NOT DETECTED NOT DETECTED    Stormy Card, [facility] 09/07/2018  7:29 AM

## 2018-09-07 NOTE — Transfer of Care (Signed)
Immediate Anesthesia Transfer of Care Note  Patient: April [name]  Procedure(s) Performed: APPLICATION OF WOUND VAC (Left ) WOUND EXPLORATION (Left )  Patient Location: ICU  Anesthesia Type:General  Level of Consciousness: drowsy  Airway & Oxygen Therapy: Patient connected to face mask oxygen  Post-op Assessment: Post -op Vital signs reviewed and stable  Post vital signs: stable  Last Vitals:  Vitals Value Taken Time  BP 114/58 09/07/2018  5:15 PM  Temp    Pulse 88 09/07/2018  5:15 PM  Resp 12 09/07/2018  5:15 PM  SpO2 100 % 09/07/2018  5:15 PM    Last Pain:  Vitals:   09/07/18 1210  TempSrc: Oral  PainSc:          Complications: No apparent anesthesia complications

## 2018-09-07 NOTE — Anesthesia Preprocedure Evaluation (Signed)
Anesthesia Evaluation  Patient identified by MRN, date of birth, ID band Patient awake    Reviewed: Allergy & Precautions, H&P , NPO status , Patient's Chart, lab work & pertinent test results, reviewed documented beta blocker date and time   Airway Mallampati: II  TM Distance: >3 FB Neck ROM: full    Dental  (+) Teeth Intact   Pulmonary COPD, Current Smoker,    Pulmonary exam normal        Cardiovascular Exercise Tolerance: Poor + Peripheral Vascular Disease  Normal cardiovascular exam Rhythm:regular Rate:Normal     Neuro/Psych Anxiety negative neurological ROS  negative psych ROS   GI/Hepatic negative GI ROS, Neg liver ROS,   Endo/Other  negative endocrine ROSdiabetes, Poorly Controlled, Type 1, Insulin Dependent, Oral Hypoglycemic Agents  Renal/GU negative Renal ROS  negative genitourinary   Musculoskeletal   Abdominal   Peds  Hematology negative hematology ROS (+)   Anesthesia Other Findings Past Medical History: No date: Anxiety No date: Colon polyps No date: COPD (chronic obstructive pulmonary disease) (HCC) No date: Diabetes mellitus without complication (HCC) No date: DVT (deep venous thrombosis) (HCC) No date: DVT (deep venous thrombosis) (HCC) No date: History of colonic polyps No date: Hyperlipidemia No date: Peripheral artery disease (HCC) 08/2012: Pulmonary nodules/lesions, multiple     Comment:  NEGATIVE PET SCAN No date: Retroperitoneal abscess (HCC)     Comment:  history of in 04/2013 No date: Tobacco abuse Past Surgical History: 07/24/2018: AMPUTATION; Left     Comment:  Procedure: AMPUTATION BELOW KNEE;  Surgeon: Annice Needy, MD;  Location: [facility] ORS;  Service: Vascular;                Laterality: Left; 07/11/2018: APPLICATION OF WOUND VAC; Right     Comment:  Procedure: APPLICATION OF WOUND VAC;  Surgeon: Gwyneth Revels, DPM;  Location: [facility] OR[facility];  Service:  Podiatry;                Laterality: Right; No date: CHOLECYSTECTOMY 05/2007: COLONOSCOPY 07/17/2018: ENDARTERECTOMY FEMORAL; Bilateral     Comment:  Procedure: ENDARTERECTOMY FEMORAL - BILATERAL SFA STENT               PLACEMENT - LEFT;  Surgeon: Annice Needy, MD;  Location:               [facility] OR[facility];  Service: Vascular;  Laterality: Bilateral; 05/25/2018: IRRIGATION AND DEBRIDEMENT FOOT; Bilateral     Comment:  Procedure: IRRIGATION AND DEBRIDEMENT FOOT application               wound vac left  foot;  Surgeon: Recardo Evangelist, DPM;                Location: [facility] OR[facility];  Service: Podiatry;  Laterality:               Bilateral; 07/11/2018: IRRIGATION AND DEBRIDEMENT FOOT; Right     Comment:  Procedure: FOOT DEBRIDEMENT;  Surgeon: Gwyneth Revels,               DPM;  Location: [facility];  Service: Podiatry;                Laterality: Right; 05/26/2018: LOWER EXTREMITY ANGIOGRAPHY; Left     Comment:  Procedure: Lower Extremity Angiography with possible  intervention;  Surgeon: Annice Needy, MD;  Location: [facility]              INVASIVE CV LAB;  Service: Cardiovascular;  Laterality:               Left; 05/27/2018: LOWER EXTREMITY ANGIOGRAPHY; Right     Comment:  Procedure: Lower Extremity Angiography;  Surgeon:               Renford Dills, MD;  Location: [facility] INVASIVE CV [facility];               Service: Cardiovascular;  Laterality: Right; 07/15/2018: LOWER EXTREMITY ANGIOGRAPHY; Right     Comment:  Procedure: Lower Extremity Angiography;  Surgeon:               Renford Dills, MD;  Location: [facility] INVASIVE CV LAB;               Service: Cardiovascular;  Laterality: Right; BMI    Body Mass Index:  15.73 kg/m     Reproductive/Obstetrics negative OB ROS                             Anesthesia Physical Anesthesia Plan  ASA: IV and emergent  Anesthesia Plan: General ETT   Post-op Pain Management:    Induction:   PONV Risk Score and Plan:   Airway  Management Planned:   Additional Equipment:   Intra-op Plan:   Post-operative Plan:   Informed Consent: I have reviewed the patients History and Physical, chart, labs and discussed the procedure including the risks, benefits and alternatives for the proposed anesthesia with the patient or authorized representative who has indicated his/her understanding and acceptance.     Dental Advisory Given  Plan Discussed with: CRNA  Anesthesia Plan Comments:         Anesthesia Quick Evaluation

## 2018-09-07 NOTE — Consult Note (Signed)
ORTHOPAEDIC CONSULTATION  REQUESTING PHYSICIAN: Alford Highland, MD  Chief Complaint:   L buttock pain  History of Present Illness: April [name] is a 63 y.o. 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  CT scan findings suggested possible fluid collection of the left gluteal region.  Patient states she is having significant amount of pain around the left buttock with possible increase in size of this region.  She rates the pain as a 9/10.  Pain is increased with increased pressure on that side.  It is improved with rest and pain medication.  She has been taken to the operating room with the vascular surgery team today.  Past Medical History:  Diagnosis Date  . Anxiety   . Colon polyps   . COPD (chronic obstructive pulmonary disease) (HCC)   . Diabetes mellitus without complication (HCC)   . DVT (deep venous thrombosis) (HCC)   . DVT (deep venous thrombosis) (HCC)   . History of colonic polyps   . Hyperlipidemia   . Peripheral artery disease (HCC)   . Pulmonary nodules/lesions, multiple 08/2012   NEGATIVE PET SCAN  . Retroperitoneal abscess (HCC)    history of in 04/2013  . Tobacco abuse    Past Surgical History:  Procedure Laterality Date  . AMPUTATION Left 07/24/2018   Procedure: AMPUTATION BELOW KNEE;  Surgeon: Annice Needy, MD;  Location: [facility];  Service: Vascular;  Laterality: Left;  . APPLICATION OF WOUND VAC Right 07/11/2018   Procedure: APPLICATION OF WOUND VAC;  Surgeon: Gwyneth Revels, DPM;  Location: [facility];  Service: Podiatry;  Laterality: Right;  . CHOLECYSTECTOMY    . COLONOSCOPY  05/2007  . ENDARTERECTOMY FEMORAL Bilateral 07/17/2018   Procedure: ENDARTERECTOMY FEMORAL - BILATERAL SFA STENT PLACEMENT - LEFT;  Surgeon: Annice Needy, MD;  Location: [facility];  Service: Vascular;  Laterality: Bilateral;  . IRRIGATION AND DEBRIDEMENT FOOT Bilateral 05/25/2018    Procedure: IRRIGATION AND DEBRIDEMENT FOOT application wound vac left  foot;  Surgeon: Recardo Evangelist, DPM;  Location: [facility];  Service: Podiatry;  Laterality: Bilateral;  . IRRIGATION AND DEBRIDEMENT FOOT Right 07/11/2018   Procedure: FOOT DEBRIDEMENT;  Surgeon: Gwyneth Revels, DPM;  Location: [facility];  Service: Podiatry;  Laterality: Right;  . LOWER EXTREMITY ANGIOGRAPHY Left 05/26/2018   Procedure: Lower Extremity Angiography with possible intervention;  Surgeon: Annice Needy, MD;  Location: [facility] INVASIVE CV [facility];  Service: Cardiovascular;  Laterality: Left;  . LOWER EXTREMITY ANGIOGRAPHY Right 05/27/2018   Procedure: Lower Extremity Angiography;  Surgeon: Renford Dills, MD;  Location: [facility] INVASIVE CV LAB;  Service: Cardiovascular;  Laterality: Right;  . LOWER EXTREMITY ANGIOGRAPHY Right 07/15/2018   Procedure: Lower Extremity Angiography;  Surgeon: Renford Dills, MD;  Location: [facility] INVASIVE CV LAB;  Service: Cardiovascular;  Laterality: Right;   Social History   Socioeconomic History  . Marital status: Divorced    Spouse name: Not on file  . Number of children: Not on file  . Years of education: Not on file  . Highest education level: Not on file  Occupational History  . Not on file  Social Needs  . Financial resource strain: Not on file  . Food insecurity:    Worry: Not on file    Inability: Not on file  . Transportation needs:    Medical: Not on file    Non-medical: Not on file  Tobacco Use  . Smoking status: Current Every Day Smoker    Packs/day:  1.50    Years: 47.00    Pack years: 70.50    Types: Cigarettes  . Smokeless tobacco: Never Used  . Tobacco comment: Smoking History 2.5-started at age 49  Substance and Sexual Activity  . Alcohol use: No    Alcohol/week: 0.0 standard drinks  . Drug use: No  . Sexual activity: Not on file  Lifestyle  . Physical activity:    Days per week: Not on file    Minutes per session: Not on file  . Stress: Not on  file  Relationships  . Social connections:    Talks on phone: Not on file    Gets together: Not on file    Attends religious service: Not on file    Active member of club or organization: Not on file    Attends meetings of clubs or organizations: Not on file    Relationship status: Not on file  Other Topics Concern  . Not on file  Social History Narrative  . Not on file   Family History  Problem Relation Age of Onset  . Brain cancer Mother    Allergies  Allergen Reactions  . Eggs Or Egg-Derived Products Diarrhea, Itching and Nausea And Vomiting   Prior to Admission medications   Medication Sig Start Date End Date Taking? Authorizing Provider  ALPRAZolam Prudy Feeler) 0.5 MG tablet Take 1 tablet (0.5 mg total) by mouth 2 (two) times daily as needed for anxiety. 07/26/18  Yes Adrian Saran, MD  aspirin 81 MG chewable tablet Chew 81 mg by mouth daily. 05/30/17  Yes [provider]  atorvastatin (LIPITOR) 40 MG tablet Take 40 mg by mouth daily. 10/24/17 10/24/18 Yes [provider]  Fluticasone-Salmeterol (ADVAIR DISKUS) 250-50 MCG/DOSE AEPB Inhale 1 puff into the lungs 2 (two) times daily. 03/14/15 01/16/26 Yes Beers, Charmayne Sheer, PA-C  gabapentin (NEURONTIN) 100 MG capsule Take 1 capsule (100 mg total) by mouth at bedtime. 07/26/18  Yes Mody, Patricia Pesa, MD  insulin aspart (NOVOLOG) 100 UNIT/ML injection Inject 3 Units into the skin 3 (three) times daily with meals. 07/26/18  Yes Mody, Patricia Pesa, MD  insulin glargine (LANTUS) 100 UNIT/ML injection Inject 0.15 mLs (15 Units total) into the skin at bedtime. 07/26/18  Yes Adrian Saran, MD  metFORMIN (GLUCOPHAGE) 500 MG tablet Take 500 mg by mouth daily with breakfast.  06/13/18  Yes [provider]  pantoprazole (PROTONIX) 40 MG tablet Take 40 mg by mouth 2 (two) times daily.  04/25/18  Yes [provider]  potassium chloride (K-DUR,KLOR-CON) 10 MEQ tablet Take by mouth. 06/13/18  Yes [provider]  rivaroxaban  (XARELTO) 10 MG TABS tablet Take 10 mg by mouth every evening.   Yes [provider]  SPIRIVA HANDIHALER 18 MCG inhalation capsule Place 18 mcg into inhaler and inhale daily.  05/19/18  Yes [provider]  tamsulosin (FLOMAX) 0.4 MG CAPS capsule Take 1 capsule (0.4 mg total) by mouth daily. 06/14/18  Yes Altamese Dilling, MD  traMADol (ULTRAM) 50 MG tablet Take 1 tablet (50 mg total) by mouth every 6 (six) hours as needed for up to 5 days for moderate pain. 09/02/18 09/07/18 Yes Georgiana Spinner, NP  vitamin C (VITAMIN C) 500 MG tablet Take 1 tablet (500 mg total) by mouth 2 (two) times daily. 06/13/18  Yes Altamese Dilling, MD  albuterol (PROVENTIL HFA;VENTOLIN HFA) 108 (90 Base) MCG/ACT inhaler Inhale 2 puffs into the lungs every 6 (six) hours as needed for wheezing or shortness of breath.  [provider]  albuterol (PROVENTIL) (2.5 MG/3ML) 0.083% nebulizer solution Inhale 2.5 mg into the lungs every 6 (six) hours as needed for wheezing or shortness of breath.     [provider]  collagenase (SANTYL) ointment Apply topically daily. 08/25/18   Auburn Bilberry, MD  ferrous sulfate 325 (65 FE) MG tablet Take 1 tablet (325 mg total) by mouth 2 (two) times daily with a meal. 06/01/18 07/01/18  Ihor Austin, MD  furosemide (LASIX) 40 MG tablet Take 1 tablet (40 mg total) by mouth daily. 06/14/18   Altamese Dilling, MD  guaiFENesin-dextromethorphan (ROBITUSSIN DM) 100-10 MG/5ML syrup Take 5 mLs by mouth every 4 (four) hours as needed for cough. 04/11/18   Sudini, Wardell Heath, MD  ipratropium-albuterol (DUONEB) 0.5-2.5 (3) MG/3ML SOLN Take 3 mLs by nebulization 3 (three) times daily. 08/25/18   Auburn Bilberry, MD  midodrine (PROAMATINE) 10 MG tablet Take 1 tablet (10 mg total) by mouth 3 (three) times daily with meals. 07/26/18   Adrian Saran, MD  polyethylene glycol (MIRALAX / GLYCOLAX) packet Take 17 g by mouth daily. 06/14/18   Altamese Dilling, MD   protein supplement shake (PREMIER PROTEIN) LIQD Take 325 mLs (11 oz total) by mouth 2 (two) times daily between meals. 06/14/18   Altamese Dilling, MD   Recent Labs    09/05/18 1141 09/05/18 1426 09/06/18 0519 09/07/18 0431  WBC 18.4*  --   --  14.4*  HGB 13.3  --   --  8.9*  HCT 42.5  --   --  28.6*  PLT 302  --   --  188  K  --  5.0  --  4.5  CL  --  89*  --  101  CO2  --  27  --  24  BUN  --  28*  --  24*  CREATININE  --  0.83  --  0.64  GLUCOSE  --  544*  --  152*  CALCIUM  --  7.7*  --  7.6*  INR  --   --  1.1  --    Mr Pelvis Wo Contrast  Result Date: 09/07/2018 CLINICAL DATA:  Evaluate gluteal fluid collection. Recent bilateral femoral endarterectomies with left groin infection. EXAM: MRI PELVIS WITHOUT CONTRAST TECHNIQUE: Multiplanar multisequence MR imaging of the pelvis was performed. No intravenous contrast was administered. COMPARISON:  CT pelvis dated September 05, 2018. FINDINGS: Urinary Tract: No abnormality visualized. Foley catheter within the decompressed bladder. Bowel:  Unremarkable visualized pelvic bowel loops. Vascular/Lymphatic: 2.6 x 3.2 x 5.0 cm heterogeneous fluid collection adjacent to the distal left common femoral artery, draining to the skin. No enlarged pelvic lymph nodes. Reproductive:  No mass or other significant abnormality. Other:  Small amount of presacral and pelvic fluid. Musculoskeletal: No suspicious marrow signal abnormality. No hip joint effusion. No acute fracture or dislocation. Prominent asymmetric enlargement of the left gluteus maximus muscle which is edematous and demonstrates patchy areas of T1 hyperintensity. There is some ill-defined fluid within the muscle, but no discrete drainable fluid collection. There is also edema within the proximal left gluteus minimus and medius muscles, and the left adductor musculature. Diffuse anasarca with slightly asymmetric soft tissue swelling of the left proximal thigh. Small midline sacral decubitus  ulcer. IMPRESSION: 1. Enlarged, edematous left gluteus maximus muscle with areas of patchy T1 hyperintensity, likely reflecting a combination of intramuscular hemorrhage and myositis. No discrete abscess or hematoma. 2. 5.0 cm heterogeneous fluid collection adjacent to the distal left femoral artery, concerning  for abscess. 3. Small midline sacral decubitus ulcer without evidence of underlying osteomyelitis. 4. Diffuse anasarca with asymmetric soft tissue swelling of the left proximal thigh, which could reflect superimposed cellulitis. Correlate with physical exam. Electronically Signed   By: Obie Dredge M.D.   On: 09/07/2018 10:53     Positive ROS: All other systems have been reviewed and were otherwise negative with the exception of those mentioned in the HPI and as above.  Physical Exam: BP 100/74 (BP Location: Left Arm)   Pulse (!) 101   Temp 98.2 F (36.8 C) (Oral)   Resp 18   Ht  (1.575 m)   Wt 39 kg   SpO2 100%   BMI 15.73 kg/m  General:  Alert, no acute distress Psychiatric:  Patient is competent for consent with normal mood and affect   Cardiovascular:  No pedal edema, regular rate and rhythm Respiratory:  No wheezing, non-labored breathing GI:  Abdomen is soft and non-tender Skin:  No lesions in the area of chief complaint, no erythema Neurologic:  Sensation intact distally, CN grossly intact Lymphatic:  No axillary or cervical lymphadenopathy  Orthopedic Exam:  LLE: Prior below-knee amputation Able to flex/extend at the hip Grossly over leg There is increased tenderness to palpation over her left buttock.  There is also some increased firmness and erythema in this region.  However there is no significantly palpable fluid collection.   Imaging:  As above: CT scan concerning for infection/inflammation.  However MRI was then obtained.  MRI does not show any focal fluid collections amenable to surgical drainage.  There may be some intramuscular hemorrhage and  myositis consistent with inflammatory findings on CT scan and clinical exam.  Assessment/Plan: 63 year old female with current wound complications in the anterior groin on the left side with left buttock pain that is consistent with myositis/intramuscular hemorrhage.  There is no focal fluid collection on imaging currently. 1.  Recommend continuing IV antibiotics per primary team.  2.  If clinical exam continues to worsen around the left buttock, with would recommend an ultrasound versus repeat MRI.  If there is a focal fluid collection, this can frequently be drained by interventional radiology and would prevent the patient from having another wound to heal given her history of wound infections.  3.  Please page with any questions.    Signa Kell   09/07/2018 4:33 PM

## 2018-09-07 NOTE — Progress Notes (Signed)
Patient ID: April [name], female   DOB: Mar 06, 1956, 63 y.o.   MRN: [id number]  [facility] PROGRESS NOTE  SHILEE SCHLEIGER DIX:[id number] DOB: Jul 03, 1955 DOA: 09/05/2018 PCP: Mickel Fuchs, MD  HPI/Subjective: Patient able to answer few more questions today than yesterday.  Complains of pain in the buttock.  Objective: Vitals:   09/06/18 2157 09/07/18 0544  BP: (!) 102/50 (!) 107/54  Pulse: 87 95  Resp:  18  Temp:  99 F (37.2 C)  SpO2:  100%    Filed Weights   09/05/18 1056 09/05/18 1957  Weight: 39 kg 39 kg    ROS: Review of Systems  Unable to perform ROS: Acuity of condition  Respiratory: Negative for shortness of breath.   Cardiovascular: Negative for chest pain.  Gastrointestinal: Negative for abdominal pain.  Musculoskeletal: Positive for joint pain.   Exam: Physical Exam  Constitutional: She appears lethargic.  HENT:  Nose: No mucosal edema.  Mouth/Throat: No oropharyngeal exudate.  Eyes: Pupils are equal, round, and reactive to light. Conjunctivae and lids are normal.  Neck: Carotid bruit is not present. No thyromegaly present.  Cardiovascular: Regular rhythm, S1 normal, S2 normal and normal heart sounds.  Respiratory: She has no decreased breath sounds. She has no wheezes. She has no rhonchi. She has no rales.  GI: Soft. Bowel sounds are normal. There is no abdominal tenderness.  Musculoskeletal:     Right ankle: She exhibits no swelling.     Left ankle: She exhibits no swelling.  Lymphadenopathy:    She has no cervical adenopathy.  Neurological: She appears lethargic.  Skin: Skin is warm. Nails show no clubbing.  Slight erythema over the skin of the left hip but no breaks in the skin on the left buttock or left hip area.  She does have a stage II decubitus on the buttock and also open area in the left thigh from prior procedure.  Psychiatric:  Lethargic      Data Reviewed: Basic Metabolic Panel: Recent Labs  Lab 09/05/18 1426  09/07/18 0431  NA 126* 131*  K 5.0 4.5  CL 89* 101  CO2 27 24  GLUCOSE 544* 152*  BUN 28* 24*  CREATININE 0.83 0.64  CALCIUM 7.7* 7.6*   CBC: Recent Labs  Lab 09/05/18 1141 09/07/18 0431  WBC 18.4* 14.4*  NEUTROABS 16.7*  --   HGB 13.3 8.9*  HCT 42.5 28.6*  MCV 96.8 97.3  PLT 302 188   BNP (last 3 results) Recent Labs    06/10/18 0621  BNP 811.0*     CBG: Recent Labs  Lab 09/06/18 0808 09/06/18 1300 09/06/18 1615 09/06/18 2003 09/07/18 0747  GLUCAP 172* 113* 120* 157* 120*    Recent Results (from the past 240 hour(s))  Blood culture (routine x 2)     Status: None (Preliminary result)   Collection Time: 09/05/18 11:42 AM  Result Value Ref Range Status   Specimen Description BLOOD RFA  Final   Special Requests   Final    BOTTLES DRAWN AEROBIC AND ANAEROBIC Blood Culture adequate volume   Culture   Final    NO GROWTH 2 DAYS Performed at [facility], [address] [place], [place] [address]    Report Status PENDING  Incomplete  Blood culture (routine x 2)     Status: None (Preliminary result)   Collection Time: 09/05/18 12:24 PM  Result Value Ref Range Status   Specimen Description   Final    BLOOD BLOOD RIGHT  HAND Performed at [facility], [address] [place], [place] [address]    Special Requests   Final    BOTTLES DRAWN AEROBIC ONLY Blood Culture adequate volume Performed at [facility], [address] [place], [place] [address]    Culture  Setup Time   Final    GRAM POSITIVE COCCI IN CLUSTERS AEROBIC BOTTLE ONLY CRITICAL RESULT CALLED TO, READ BACK BY AND VERIFIED WITH: Kevin Fenton PATEL @1616  09/06/18 AKT Performed at [facility], [address] [place], [place] [address]    Culture GRAM POSITIVE COCCI IN CLUSTERS  Final   Report Status PENDING  Incomplete  Blood Culture ID Panel (Reflexed)     Status: Abnormal   Collection Time: 09/05/18 12:24 PM  Result Value Ref Range Status   Enterococcus species  NOT DETECTED NOT DETECTED Final   Listeria monocytogenes NOT DETECTED NOT DETECTED Final   Staphylococcus species DETECTED (A) NOT DETECTED Final    Comment: Methicillin (oxacillin) resistant coagulase negative staphylococcus. Possible blood culture contaminant (unless isolated from more than one blood culture draw or clinical case suggests pathogenicity). No antibiotic treatment is indicated for blood  culture contaminants. CRITICAL RESULT CALLED TO, READ BACK BY AND VERIFIED WITH: Kevin Fenton PATEL @1616  09/06/18 AKT    Staphylococcus aureus (BCID) NOT DETECTED NOT DETECTED Final   Methicillin resistance DETECTED (A) NOT DETECTED Final    Comment: CRITICAL RESULT CALLED TO, READ BACK BY AND VERIFIED WITH: Kevin Fenton PATEL @1616  09/06/18 AKT    Streptococcus species NOT DETECTED NOT DETECTED Final   Streptococcus agalactiae NOT DETECTED NOT DETECTED Final   Streptococcus pneumoniae NOT DETECTED NOT DETECTED Final   Streptococcus pyogenes NOT DETECTED NOT DETECTED Final   Acinetobacter baumannii NOT DETECTED NOT DETECTED Final   Enterobacteriaceae species NOT DETECTED NOT DETECTED Final   Enterobacter cloacae complex NOT DETECTED NOT DETECTED Final   Escherichia coli NOT DETECTED NOT DETECTED Final   Klebsiella oxytoca NOT DETECTED NOT DETECTED Final   Klebsiella pneumoniae NOT DETECTED NOT DETECTED Final   Proteus species NOT DETECTED NOT DETECTED Final   Serratia marcescens NOT DETECTED NOT DETECTED Final   Haemophilus influenzae NOT DETECTED NOT DETECTED Final   Neisseria meningitidis NOT DETECTED NOT DETECTED Final   Pseudomonas aeruginosa NOT DETECTED NOT DETECTED Final   Candida albicans NOT DETECTED NOT DETECTED Final   Candida glabrata NOT DETECTED NOT DETECTED Final   Candida krusei NOT DETECTED NOT DETECTED Final   Candida parapsilosis NOT DETECTED NOT DETECTED Final   Candida tropicalis NOT DETECTED NOT DETECTED Final    Comment: Performed at [facility], [address] [place], [place] [address]  Aerobic/Anaerobic Culture (surgical/deep wound)     Status: None (Preliminary result)   Collection Time: 09/06/18  1:14 PM  Result Value Ref Range Status   Specimen Description   Final    WOUND GROIN Performed at [facility], [address] [place], [place] [address]    Special Requests   Final    NONE Performed at [facility], [address] [place], [place] [address]    Gram Stain   Final    FEW WBC PRESENT, PREDOMINANTLY PMN MODERATE GRAM POSITIVE COCCI IN PAIRS FEW BUDDING YEAST SEEN FEW GRAM POSITIVE RODS Performed at [facility], [address], [place], [place] [address]    Culture PENDING  Incomplete   Report Status PENDING  Incomplete     Studies: Ct Pelvis W Contrast  Result Date: 09/05/2018  CLINICAL DATA:  Here with ex-boyfriend. Pt has had vascular surgery with balloons to legs. Wound opened. Recent amputation. Pt is diabetic. He has taken her back to vascular center 2 days ago for wound that he is concerned about. Was placed on a second round of antibiotics. Groin wound is swollen per family. Draining yellow fluid per family. No fever. Per family, infection under the skin of the buttocks. Severe pain in this region. EXAM: CT PELVIS WITH CONTRAST TECHNIQUE: Multidetector CT imaging of the pelvis was performed using the standard protocol following the bolus administration of intravenous contrast. CONTRAST:  75mL OMNIPAQUE IOHEXOL 300 MG/ML  SOLN COMPARISON:  06/07/2018 CT abdomen and pelvis FINDINGS: Urinary Tract: Distended urinary bladder. LOWER portions of the kidneys are unremarkable. Bowel: Moderate stool burden. No bowel dilatation or bowel wall thickening. Vascular/Lymphatic: Aorto bi-iliac stent grafts. Bilateral femoral artery stents. There is dense atherosclerosis of the native vessels. Along the LEFT proximal femoral artery there is an irregular heterogeneous gas and fluid collection with rim enhancement which  measures 1.7 x 7.8 x 2.5 centimeters. Small amount of air is identified within the wound at the skin, consistent with the history of draining wound. There is no evidence for pseudoaneurysm or arteriovenous fistula. Reproductive:  The uterus is present.  No adnexal mass. Other:  No ascites.  Anterior abdominal wall is unremarkable. The LEFT gluteus maximus is enlarged and heterogeneous, measuring 5.4 centimeters, compared to 1.8 centimeters on the RIGHT. Vessels traverse the muscle and considerations include inflammation/infection, or resolving hematoma. Mass is significantly less likely given the normal appearance on the December scan. Given the heterogeneity, is difficult to determine if there is a drainable fluid collection in this region. Significant diffuse body wall edema. Musculoskeletal: No acute osseous abnormality. IMPRESSION: 1. Heterogeneous gas and fluid collection adjacent to the LEFT femoral artery, suspicious for abscess. 2. Heterogeneous enlargement of the LEFT gluteus maximus, consistent with abscess, inflammation, or resolving hematoma. Given the heterogeneity, infection/inflammation is favored. Consider ultrasound to evaluate for drainable fluid collections. 3. Distended urinary bladder. 4. Vascular stents. Electronically Signed   By: Norva Pavlov M.D.   On: 09/05/2018 15:25    Scheduled Meds: . aspirin  81 mg Oral Daily  . atorvastatin  40 mg Oral Daily  . collagenase   Topical Daily  . docusate sodium  100 mg Oral BID  . ferrous sulfate  325 mg Oral BID WC  . gabapentin  100 mg Oral QHS  . insulin aspart  0-5 Units Subcutaneous QHS  . insulin aspart  0-9 Units Subcutaneous TID WC  . insulin glargine  15 Units Subcutaneous QHS  . midodrine  10 mg Oral TID WC  . mometasone-formoterol  2 puff Inhalation BID  . pantoprazole  40 mg Oral BID  . polyethylene glycol  17 g Oral Daily  . povidone-iodine   Topical BID  . protein supplement shake  11 oz Oral BID BM  . tamsulosin  0.4  mg Oral Daily  . ascorbic acid  500 mg Oral BID   Continuous Infusions: . sodium chloride Stopped (09/06/18 1717)  . sodium chloride 100 mL/hr at 09/07/18 0300  . ceFEPime (MAXIPIME) IV Stopped (09/06/18 2128)  . metronidazole 500 mg (09/07/18 0547)  . vancomycin      Assessment/Plan:  1. Clinical sepsis with left hip/buttock abscess.  1 set of blood cultures positive for Staphylococcus species with methicillin resistance.  Continue aggressive antibiotics with Maxipime vancomycin and Flagyl.  Vascular surgery to take to the operating  room this afternoon.  I will also get orthopedic surgery consult by Dr. Allena Katz because I am seeing some redness on the left hip and the patient does have a fluid collection in the left gluteal region.  He ordered an MRI for further evaluation 2. Hyponatremia.  Improved this morning with IV fluid 3. Type 2 diabetes mellitus on low-dose glargine and sliding scale 4. Stage II decubiti local wound care 5. Severe malnutrition 6. Chronic hypotension on midodrine 7. Hyperlipidemia unspecified on atorvastatin 8. Diabetic neuropathy on gabapentin 9. GERD on PPI 10. Peripheral vascular disease.  Holding Xarelto at this time.  Left leg amputation site looks good.  Code Status:     Code Status Orders  (From admission, onward)         Start     Ordered   09/05/18 1941  Full code  Continuous     09/05/18 1940        Code Status History    Date Active Date Inactive Code Status Order ID Comments User Context   08/21/2018 1658 08/25/2018 1932 DNR [id number]  Adrian Saran, MD ED   08/21/2018 1656 08/21/2018 1658 DNR [id number]  Adrian Saran, MD ED   07/21/2018 1302 07/26/2018 2148 DNR [id number]  Glee Arvin, NP Inpatient   07/09/2018 1730 07/21/2018 1302 Partial Code [id number]  Mayo, Allyn Kenner, MD Inpatient   06/03/2018 0621 06/13/2018 2013 Full Code [id number]  Arnaldo Natal, MD Inpatient   05/23/2018 0253 06/01/2018 2158 Full Code [id number]  Cammy Copa, MD Inpatient   05/23/2018 0138 05/23/2018 0253 DNR [id number]  Cammy Copa, MD ED   04/10/2018 1111 04/11/2018 1554 DNR [id number]  Milagros Loll, MD Inpatient   04/09/2018 1519 04/10/2018 1111 Full Code [id number]  Milagros Loll, MD Inpatient   04/09/2018 1337 04/09/2018 1519 DNR [id number]  Milagros Loll, MD Inpatient   04/09/2018 0012 04/09/2018 1337 Full Code [id number]  Oralia Manis, MD ED     Family Communication: Spoke with daughter at the bedside Disposition Plan: To be determined  Consultants:  Vascular surgery  General surgery  Antibiotics:  Vancomycin  Cefepime  Flagyl  Time spent: 30 minutes, including ACP time.  Discussed with orthopedic surgery nursing staff.  Stony Stegmann [employer]

## 2018-09-07 NOTE — Progress Notes (Signed)
Patient ID: April [name], female   DOB: [birth year], 63 y.o.   MRN: [id number]  ACP note  Spoke with patient and daughter at the bedside  Diagnosis sepsis, left hip buttock abscess, hyponatremia, type 2 diabetes, stage II decubitus on sacrum, severe malnutrition, chronic hypotension  CODE STATUS discussed and patient is a full code  Patient came into the hospital with altered mental status and was found to have a draining area from the left groin and found to have a left buttock fluid collection on CT scan.  The patient was started on aggressive antibiotics.  Found to have a positive blood culture.  Patient today going to the operating room for further evaluation.  Case discussed with orthopedic surgery to evaluate left hip area.  Overall prognosis is poor.  Time spent on ACP discussion 17 minutes Dr Alford Highland

## 2018-09-07 NOTE — Op Note (Addendum)
Preoperative diagnosis: Sepsis from left inguinal groin infection with compromised patch angioplasty(non-autologous tissue) of prior left femoral endarterectomy with patch angioplasty. Post Operative diagnosis. Same Surgeon:  Elmore Guise, MD Procedure: Left groin wound exploration, sharp debridement, removal of infected femoral enarterectomy patch angioplasty, common femoral endarterectomy, autologous patch angioplasty with right below the knee greater saphenous vein, wound washout with Pulsavac irrigator with vancomycin solution, and application of wound VAC with silver sponge.   Anesthesia:  Mac/local  Description of procedure: After the patient was appropriately consented and all the risks and benefits of the procedure were explained to the patient and her daughter.  The patient was taken to the operating room and placed supine on the operating table.  After MAC anesthesia was initiated the patient's lower abdomen and bilateral lower extremities were prepped and draped in the standard surgical fashion.  Timeout was performed and the site was identified.  Attention was placed to the left inguinal region where an open wound is noted.  Lidocaine with epinephrine was then injected into the skin and subcutaneous tissues around the previous incision of the left groin and the amount of 20 mL's.  Using a 10 blade scalpel the previous incision is opened, going through skin and subcutaneous tissues and extending the incision slight cephalad for better exposure.  Electrocautery is utilized for hemostasis.  Dense collection of pus is noted, which is swabed and sent to microbiology for cultures/sesitivity.  It is clearly noted that the patch angioplasty was compromised by the infection, since it was bathing in the purulent material.  Self retaining retractors were placed proximal and distal in the incison for exposure, and copious irrigation was performed and some debridement was performed sharply with Mayo  scissors.  Gentle dissection of the tissue surrounding the common femoral artery was performed, the profunda femoral artery is noted to be small and a blue vessel loop is placed circumferentially around this vessel.  The origin of superficial femoral artery is dissected and a red vessel loop was applied circumferentially.   The proximal common femoral artery is dissected and a red vessel loop was placed circumferentially.  The full length of the patch angioplasty is visualized.  There is enough space to clamp above and below the patch.  At that point proximal and distal vascular clamps were positioned the patient was given 3000 of unit units of heparin intravenously and after allowing the heparin to circulate for 2 minutes the clamp was placed distally and then proximally. Then the vessels loop around the profunda femoral artery was lifted and attached to the field with slight tension enough to restrict flow.  The patch is then removed completely with an 11 blade scalpel.  Then it is noted that there is significant neointimal hyperplasia and mural thrombus which has developed devwhich is then removed with .  Using a Garment/textile technologist a endarterectomy of the neointimal hyperplasia with thrombus was performed.  Some tacking sutures were placed to affix some flaps. The profund femoral vessel is small and no back bleeding is noted.  There is very sluggish backbleeding from the superficial femoral artery.  Hep saline solution  is injected in the proximal superficial femoral artery and the flow seemed to be without resistance.  At that point it  was decided to put a carotid shunt after dilating the proximal and distal aspects of these vessels with coronary dilators.  Shunt was first introduced into the superficial femoral artery and backbleeding was allowed, which was slow.  Then the shunt was placed  into the common femoral artery proximally and the Javid clamp was applied on both ends.  A Doppler was utilized to assess  flow, which was well present.  At that point the edges of the native vessel were examined in an attempt to approximate them primarily.  Due to the significant narrowing this would cause it was decided to opt for autologous patch angioplasty with GSV.  A large medial greater saphenous vein is noted below the knee.  Lidocaine with epinephrine was then injected into the skin and subcutaneous tissues, after marking the trajectory of the vein for approximately 10 cm.  After adequate local anesthesia and using a 10 scalpel an incision is made through the skin and subcutaneous tissues exposing the greater saphenous vein.  The greater saphenous vein is dissected proximally and distally and all branches tied with 4-0 silk ties.  The proximal and distal ends of the greater saphenous vein were tied with a 2-0 silk and the vein segment was excised.  The vein segment is then prepped and using a Potts scissors transected down the middle.  There is noted to be adequate without valves, and was placed in a moist 4 x 4 gauze and set aside.  Harvest site was then irrigated with saline solution and the subdermal tissues were approximated with 3-0 Vicryl in a subdermal fashion followed by skin staples to the skin.  The wound was cleaned and covered.  Attention was then placed to the endarterectomy site.  Using a 6-0 Prolene, double armed suture, the patch is tacked down to the distal aspect of the repair at the level of the superficial femoral artery first and then in a continuous fashion taken around medial and laterally to the mid section of the repair.  Then the graft is taken and tailored to meet the common femoral artery proximally and using a 6-0 Prolene suture the patch was tacked down at the apex.  The 6-0 sutures were ran medially and then laterally to meet the other two sutures.  Before the last sutures were made  in the lateral aspect of the repair, the distal flow was released first then the cavity of the repair was  flushed with heparinized saline solution and then proximal flow was released to flush out debris, then the vessel was irrigated with hep saline, followed by placement of 2 more sutures to create a tight seal, while releasing the outflow, followed by the inflow.  Using a Doppler it was noted that the flow had a questionable waterhammer sign, which was possibly indicative of an SFA thrombus or a proximal dissection flap.  Proximal and distal clamping was again performed and a small incision was then made into the patch and a #3 Fogarty was introduced into the superficial femoral artery and gently inflated with 0.38m of air and pulled back slowly.  No debris was noted.  The superficial femoral artery was then flushed with 100 mcg of nitroglycerin followed by heparinized saline solution.  That was then repaired with 3 interrupted sutures and flow was allowed to go into the superficial femoral artery again.  The doppler was placed over the top of the patch angioplasty and noted to have a biphasic Doppler signal. At that point copious irrigation of the wound was performed using the Pulsavac irrigator with a solution of 1 g of vancomycin and 1000 mL's of NS.  Further debridement of necrotic tissue was also performed sharply.  Then the fascia was mobilized and with 4 interrupted 3-0 Vicryl sutures the femoral  artery patch angioplasty was covered.  Local flap mobilization was performed after incising half a centimeter below the dermis to loosen the edges of the skin.  A silver VAC sponge was tailored to fit the surgical wound followed by clear adhesive dressings.  The VAC was applied to suction and demonstrated a good seal.  The harvest site was cleaned and covered with sterile dressings.  The patient tolerated the procedure well and was transferred to the recovery area in satisfactory condition.  Complications: None Estimated blood loss: 100 mL's Specimen: None autologous patch and necrotic tissue. Cultures from  purulent brown none odorous fluid.

## 2018-09-07 NOTE — Progress Notes (Signed)
Day of Surgery   Subjective/Chief Complaint: [name] who was admitted through the emergency department yesterday.  She has a history of vascular occlusive disease and underwent femoral endarterectomy followed by subsequent left below the knee amputation.  She came in to the ED with concerns for wound infections at her surgical sites.  There was also concern for a sacral decubitus ulcer or abscess.  A CT scan was performed that did not demonstrate any abscess in the area of concern.  The imaging findings were more consistent with inflammation/infection, or resolving hematoma.  The sacral site shows a small amount of fibrinous exudate at the skin surface without any fluctuance, drainage, or tracking.  She continues to fluctuate between somnolence and shouting for pain medications.  Vascular surgery is planning to take her to the operating room today.  Hospitalist notes continue to describe a gluteal abscess, however there is no evidence of such on CT scan or physical exam. An MRI has been ordered.   Objective: Vital signs in last 24 hours: Temp:  [97.7 F (36.5 C)-99 F (37.2 C)] 99 F (37.2 C) (03/08 0544) Pulse Rate:  [87-95] 95 (03/08 0544) Resp:  [16-18] 18 (03/08 0544) BP: (92-107)/(43-54) 107/54 (03/08 0544) SpO2:  [100 %] 100 % (03/08 0544) Last BM Date: 09/03/18  Intake/Output from previous day: 03/07 0701 - 03/08 0700 In: 1012.6 [P.O.:120; I.V.:476.2; IV Piggyback:416.4] Out: 550 [Urine:550] Intake/Output this shift: No intake/output data recorded.  General appearance: appears older than stated age, cachectic and somewhat somnolent; rouses with mild stimulation Resp: on , no increase WOB Cardio: regular rate and rhythm Skin: Just posterior to the greater trochanter on the left, there is an area of indurated soft tissue.  There is no associated erythema, warmth, or areas of fluctuance.  The skin overlying the sacrum is mildly erythematous.  There is a small  sore, roughly 1.5 x 1 cm and flush with the skin.  There is fibrinous exudate present.  No drainage or fluctuance.  Lab Results:  Recent Labs    09/05/18 1141 09/07/18 0431  WBC 18.4* 14.4*  HGB 13.3 8.9*  HCT 42.5 28.6*  PLT 302 188   BMET Recent Labs    09/05/18 1426 09/07/18 0431  NA 126* 131*  K 5.0 4.5  CL 89* 101  CO2 27 24  GLUCOSE 544* 152*  BUN 28* 24*  CREATININE 0.83 0.64  CALCIUM 7.7* 7.6*   PT/INR Recent Labs    09/06/18 0519  LABPROT 14.0  INR 1.1   ABG No results for input(s): PHART, HCO3 in the last 72 hours.  Invalid input(s): PCO2, PO2  Studies/Results: Ct Pelvis W Contrast  Result Date: 09/05/2018 CLINICAL DATA:  Here with ex-boyfriend. Pt has had vascular surgery with balloons to legs. Wound opened. Recent amputation. Pt is diabetic. He has taken her back to vascular center 2 days ago for wound that he is concerned about. Was placed on a second round of antibiotics. Groin wound is swollen per family. Draining yellow fluid per family. No fever. Per family, infection under the skin of the buttocks. Severe pain in this region. EXAM: CT PELVIS WITH CONTRAST TECHNIQUE: Multidetector CT imaging of the pelvis was performed using the standard protocol following the bolus administration of intravenous contrast. CONTRAST:  98mL OMNIPAQUE IOHEXOL 300 MG/ML  SOLN COMPARISON:  06/07/2018 CT abdomen and pelvis FINDINGS: Urinary Tract: Distended urinary bladder. LOWER portions of the kidneys are unremarkable. Bowel: Moderate stool burden. No bowel dilatation or bowel  wall thickening. Vascular/Lymphatic: Aorto bi-iliac stent grafts. Bilateral femoral artery stents. There is dense atherosclerosis of the native vessels. Along the LEFT proximal femoral artery there is an irregular heterogeneous gas and fluid collection with rim enhancement which measures 1.7 x 7.8 x 2.5 centimeters. Small amount of air is identified within the wound at the skin, consistent with the history of  draining wound. There is no evidence for pseudoaneurysm or arteriovenous fistula. Reproductive:  The uterus is present.  No adnexal mass. Other:  No ascites.  Anterior abdominal wall is unremarkable. The LEFT gluteus maximus is enlarged and heterogeneous, measuring 5.4 centimeters, compared to 1.8 centimeters on the RIGHT. Vessels traverse the muscle and considerations include inflammation/infection, or resolving hematoma. Mass is significantly less likely given the normal appearance on the December scan. Given the heterogeneity, is difficult to determine if there is a drainable fluid collection in this region. Significant diffuse body wall edema. Musculoskeletal: No acute osseous abnormality. IMPRESSION: 1. Heterogeneous gas and fluid collection adjacent to the LEFT femoral artery, suspicious for abscess. 2. Heterogeneous enlargement of the LEFT gluteus maximus, consistent with abscess, inflammation, or resolving hematoma. Given the heterogeneity, infection/inflammation is favored. Consider ultrasound to evaluate for drainable fluid collections. 3. Distended urinary bladder. 4. Vascular stents. Electronically Signed   By: Norva Pavlov M.D.   On: 09/05/2018 15:25    Anti-infectives: Anti-infectives (From admission, onward)   Start     Dose/Rate Route Frequency Ordered Stop   09/07/18 1500  vancomycin (VANCOCIN) 500 mg in sodium chloride 0.9 % 100 mL IVPB     500 mg 100 mL/hr over 60 Minutes Intravenous Every 24 hours 09/07/18 0727     09/06/18 1300  vancomycin (VANCOCIN) 500 mg in sodium chloride 0.9 % 100 mL IVPB  Status:  Discontinued     500 mg 100 mL/hr over 60 Minutes Intravenous Every 24 hours 09/05/18 1637 09/06/18 1630   09/05/18 2000  metroNIDAZOLE (FLAGYL) IVPB 500 mg     500 mg 100 mL/hr over 60 Minutes Intravenous Every 8 hours 09/05/18 1940     09/05/18 2000  ceFEPIme (MAXIPIME) 2 g in sodium chloride 0.9 % 100 mL IVPB     2 g 200 mL/hr over 30 Minutes Intravenous Every 24 hours  09/05/18 1637     09/05/18 1945  ceFEPIme (MAXIPIME) 2 g in sodium chloride 0.9 % 100 mL IVPB  Status:  Discontinued     2 g 200 mL/hr over 30 Minutes Intravenous  Once 09/05/18 1940 09/05/18 1944   09/05/18 1945  vancomycin (VANCOCIN) IVPB 1000 mg/200 mL premix  Status:  Discontinued     1,000 mg 200 mL/hr over 60 Minutes Intravenous  Once 09/05/18 1940 09/05/18 1945   09/05/18 1130  piperacillin-tazobactam (ZOSYN) IVPB 3.375 g     3.375 g 100 mL/hr over 30 Minutes Intravenous  Once 09/05/18 1115 09/05/18 1307   09/05/18 1130  vancomycin (VANCOCIN) IVPB 1000 mg/200 mL premix     1,000 mg 200 mL/hr over 60 Minutes Intravenous  Once 09/05/18 1115 09/05/18 1410      Assessment/Plan: S/p multiple vascular interventions, presenting to the hospital with wound complications, as well as a sacral pressure sore.  There is no abscess in the left gluteal region based on CT imaging and physical exam, and no evidence of tissue or skin necrosis.  There is no indication for surgical intervention at this time. An MRI has been ordered. If there are alternate findings encountered on this study, will reassess need for  surgical intervention. For now, recommend continuing antibiotics and local wound care to the sacral site.  General surgery will continue to follow.  LOS: 2 days    Duanne Guess 09/07/2018

## 2018-09-08 ENCOUNTER — Encounter: Payer: Self-pay | Admitting: Surgery

## 2018-09-08 ENCOUNTER — Other Ambulatory Visit (INDEPENDENT_AMBULATORY_CARE_PROVIDER_SITE_OTHER): Payer: Self-pay | Admitting: Nurse Practitioner

## 2018-09-08 DIAGNOSIS — A419 Sepsis, unspecified organism: Secondary | ICD-10-CM

## 2018-09-08 DIAGNOSIS — Z9889 Other specified postprocedural states: Secondary | ICD-10-CM

## 2018-09-08 DIAGNOSIS — L89152 Pressure ulcer of sacral region, stage 2: Secondary | ICD-10-CM

## 2018-09-08 DIAGNOSIS — T8149XA Infection following a procedure, other surgical site, initial encounter: Secondary | ICD-10-CM

## 2018-09-08 LAB — BASIC METABOLIC PANEL
Anion gap: 6 (ref 5–15)
BUN: 17 mg/dL (ref 8–23)
CALCIUM: 7.5 mg/dL — AB (ref 8.9–10.3)
CO2: 24 mmol/L (ref 22–32)
Chloride: 104 mmol/L (ref 98–111)
Creatinine, Ser: 0.6 mg/dL (ref 0.44–1.00)
GFR calc non Af Amer: >60 mL/min (ref >60)
Glucose, Bld: 226 mg/dL — ABNORMAL HIGH (ref 70–99)
Potassium: 4.4 mmol/L (ref 3.5–5.1)
Sodium: 134 mmol/L — ABNORMAL LOW (ref 135–145)

## 2018-09-08 LAB — CBC
HCT: 29.8 % — ABNORMAL LOW (ref 36.0–46.0)
HEMOGLOBIN: 9.2 g/dL — AB (ref 12.0–15.0)
MCH: 30.3 pg (ref 26.0–34.0)
MCHC: 30.9 g/dL (ref 30.0–36.0)
MCV: 98 fL (ref 80.0–100.0)
Platelets: 190 10*3/uL (ref 150–400)
RBC: 3.04 MIL/uL — ABNORMAL LOW (ref 3.87–5.11)
RDW: 16.2 % — ABNORMAL HIGH (ref 11.5–15.5)
WBC: 13.8 10*3/uL — ABNORMAL HIGH (ref 4.0–10.5)
nRBC: 0 % (ref 0.0–0.2)

## 2018-09-08 LAB — CULTURE, BLOOD (ROUTINE X 2): Special Requests: ADEQUATE

## 2018-09-08 LAB — GLUCOSE, CAPILLARY
GLUCOSE-CAPILLARY: 115 mg/dL — AB (ref 70–99)
Glucose-Capillary: 268 mg/dL — ABNORMAL HIGH (ref 70–99)
Glucose-Capillary: 202 mg/dL — ABNORMAL HIGH (ref 70–99)
Glucose-Capillary: 107 mg/dL — ABNORMAL HIGH (ref 70–99)
Glucose-Capillary: 69 mg/dL — ABNORMAL LOW (ref 70–99)
Glucose-Capillary: 105 mg/dL — ABNORMAL HIGH (ref 70–99)

## 2018-09-08 MED ORDER — SODIUM CHLORIDE 0.9 % IV SOLN
INTRAVENOUS | Status: DC
  Administered 2018-09-08 – 2018-09-09 (×2): via INTRAVENOUS

## 2018-09-08 MED ORDER — HYDROCODONE-ACETAMINOPHEN 5-325 MG PO TABS
1.0000 | ORAL_TABLET | Freq: Four times a day (QID) | ORAL | Status: DC | PRN
  Administered 2018-09-08 – 2018-09-09 (×2): 1 via ORAL
  Filled 2018-09-08 (×2): qty 1

## 2018-09-08 MED ORDER — ENOXAPARIN SODIUM 30 MG/0.3ML ~~LOC~~ SOLN
30.0000 mg | SUBCUTANEOUS | Status: DC
  Administered 2018-09-09 – 2018-09-11 (×2): 30 mg via SUBCUTANEOUS
  Filled 2018-09-08 (×2): qty 0.3

## 2018-09-08 MED ORDER — VANCOMYCIN HCL IN DEXTROSE 750-5 MG/150ML-% IV SOLN
750.0000 mg | INTRAVENOUS | Status: DC
  Administered 2018-09-08 – 2018-09-09 (×2): 750 mg via INTRAVENOUS
  Filled 2018-09-08 (×3): qty 150

## 2018-09-08 MED ORDER — NALOXONE HCL 0.4 MG/ML IJ SOLN: 0.4000 mg | INTRAMUSCULAR | Status: DC | PRN

## 2018-09-08 NOTE — Progress Notes (Addendum)
[facility] SURGICAL PROGRESS NOTE (cpt [phone])  Hospital Day(s): 3.   Post op day(s): 1 Day Post-Op.   Interval History: Patient seen and examined, no acute events or new complaints overnight. Patient is sleeping upon entrance to the room but arouses to verbal and painful stimuli. She endorses generalized pain but falls back to sleep quickly. No other complaints reported. Underwent surgery with Vascular yesterday.  MRI did not show any abscess or other surgical issue in the left gluteal area of concern.  Review of Systems:  Constitutional: denies fever, chills  Musculoskeletal: + pain, + Left BKA, denied decreased motor or sensation Integumentary: + Sacral Ulceration    Vital signs in last 24 hours: [min-max] current  Temp:  [97.8 F (36.6 C)-98.9 F (37.2 C)] 98.9 F (37.2 C) (03/09 0615) Pulse Rate:  [88-134] 125 (03/09 0615) Resp:  [12-20] 20 (03/09 0615) BP: (90-231)/(44-187) 101/60 (03/09 0615) SpO2:  [95 %-100 %] 95 % (03/09 0615)     Height: 5\' 2"  (157.5 cm) Weight: 39 kg BMI (Calculated): 15.72   Intake/Output this shift:  Total I/O In: -  Out: 100 [Urine:100]   Intake/Output last 2 shifts:  @IOLAST2SHIFTS @   Physical Exam:  Constitutional: Somnolent, arouses to stimuli but falls back to sleep quickly HENT: normocephalic without obvious abnormality  Respiratory: breathing non-labored at rest  Musculoskeletal: Left BKA - incision is CDI, no ertyhema Integumentary: Just posterior to the greater trochanter on the left, there is an area of indurated soft tissue.  There is no associated erythema, warmth, or areas of fluctuance.  The skin overlying the sacrum is mildly erythematous.  There is a small sore, roughly 1.5 x 1 cm and flush with the skin.  There is fibrinous exudate present.  No drainage or fluctuance.  Wound vac to left inguinal region.   Labs:  CBC Latest Ref Rng & Units 09/08/2018 09/07/2018 09/05/2018  WBC 4.0 - 10.5 K/uL 13.8(H) 14.4(H) 18.4(H)   Hemoglobin 12.0 - 15.0 g/dL 5.6(P) 8.9(L) 13.3  Hematocrit 36.0 - 46.0 % 29.8(L) 28.6(L) 42.5  Platelets 150 - 400 K/uL 190 188 302   CMP Latest Ref Rng & Units 09/08/2018 09/07/2018 09/05/2018  Glucose 70 - 99 mg/dL 014(D) 030(D) 314(HO)  BUN 8 - 23 mg/dL 17 88(L) 57(V)  Creatinine 0.44 - 1.00 mg/dL 7.28 2.06 0.15  Sodium 135 - 145 mmol/L 134(L) 131(L) 126(L)  Potassium 3.5 - 5.1 mmol/L 4.4 4.5 5.0  Chloride 98 - 111 mmol/L 104 101 89(L)  CO2 22 - 32 mmol/L 24 24 27   Calcium 8.9 - 10.3 mg/dL 7.5(L) 7.6(L) 7.7(L)  Total Protein 6.5 - 8.1 g/dL - - -  Total Bilirubin 0.3 - 1.2 mg/dL - - -  Alkaline Phos 38 - 126 U/L - - -  AST 15 - 41 U/L - - -  ALT 0 - 44 U/L - - -     Imaging studies:   MRI Pelvis (09/07/2018) personally reviewed and radiologist report reviewed:  IMPRESSION: 1. Enlarged, edematous left gluteus maximus muscle with areas of patchy T1 hyperintensity, likely reflecting a combination of intramuscular hemorrhage and myositis. No discrete abscess or hematoma. 2. 5.0 cm heterogeneous fluid collection adjacent to the distal left femoral artery, concerning for abscess. 3. Small midline sacral decubitus ulcer without evidence of underlying osteomyelitis. 4. Diffuse anasarca with asymmetric soft tissue swelling of the left proximal thigh, which could reflect superimposed cellulitis. Correlate with physical exam.   Assessment/Plan: (ICD-10's: L89.152) 63 y.o. female with a stage 2  sacral pressure injury without evidence of underlying abscess on multiple radiologic studies, complicated by pertinent comorbidities including severe PAD s/p left BKA, COPD, DM, HLD, malnutrition, and current tobacco abuse (smoking).   - continue local wound care, pressure offloading  - Continue IV Abx, pain control  - left inguinal wound vac per vascular team  - medical management of comorbidities per primary team   - No indication for surgical intervention. There does not appear to be any  evidence of sacral or gluteal abscess. Given the patients vascular status she would likely not heal well from a wide debridement, regardless.  If further concern exists, consider re-imaging, but would recommend percutaneous drainage of any fluid collections, rather than surgical I&D. General surgery will sign off, please re-consult if new issues arise.    All of the above findings and recommendations were discussed with the patient, patient's family, and the medical team, and all of patient's family's questions were answered to her expressed satisfaction.  -- Lynden Oxford, PA-C [facility] 09/08/2018, 8:20 AM [phone] M-F: 7am - 4pm  I saw and evaluated the patient.  I agree with the above documentation, exam, and plan, which I have edited where appropriate. Duanne Guess  9:17 AM

## 2018-09-08 NOTE — Progress Notes (Signed)
PT Cancellation Note  Patient Details Name: April [name] MRN: [id number] DOB: 1956-04-16   Cancelled Treatment:    Reason Eval/Treat Not Completed: Other (comment).  PT consult received.  Chart reviewed.  Upon arrival to pt's room, pt's wound vac noted to be beeping.  Pt awake and reporting 8.5/10 buttock pain and requesting pain meds (nurse notified of pt's request for pain meds and also that wound vac was beeping).  Pt declining PT at this time d/t pain.  Will re-attempt PT evaluation at a later date/time.  Hendricks Limes, PT 09/08/18, 2:23 PM [phone]

## 2018-09-08 NOTE — Progress Notes (Addendum)
[facility] Daily Progress Note    Subjective: 1 Day Post-Op: Left groin wound exploration, sharp debridement, removal of infected femoral enarterectomy patch angioplasty, common femoral endarterectomy, autologous patch angioplasty with right below the knee greater saphenous vein, wound washout with Pulsavac irrigator with vancomycin solution, and application of wound VAC with silver sponge.   Patient complaining of pain from left groin and buttock this AM.  Objective: Vitals:   09/07/18 2000 09/07/18 2052 09/07/18 2232 09/08/18 0615  BP: (!) 94/52 (!) 105/49 (!) 111/58 101/60  Pulse:  (!) 101 (!) 106 (!) 125  Resp:  20  20  Temp:  98 F (36.7 C)  98.9 F (37.2 C)  TempSrc:  Oral  Oral  SpO2:  100%  95%  Weight:      Height:        Intake/Output Summary (Last 24 hours) at 09/08/2018 1011 Last data filed at 09/08/2018 0749 Gross per 24 hour  Intake 1645.42 ml  Output 820 ml  Net 825.42 ml   Physical Exam: A&Ox3, NAD CV: RRR Pulmonary: CTA Bilaterally Abdomen: Soft, Nontender, Nondistended, (+) Bowel Sounds Left Groin: VAC in place and to seal.  Vascular: Left Lower Extremity: Thigh soft, calf soft. Stump healthy - no breakdown noted. Right Lower Extremity: Thigh soft, calf soft. Warm distally to toes. Hard to palpate pedal pulses but toes are warm. Toe amputation site intact, no break down of skin.    Laboratory: CBC    Component Value Date/Time   WBC 13.8 (H) 09/08/2018 0218   HGB 9.2 (L) 09/08/2018 0218   HGB 13.6 10/14/2014 0644   HCT 29.8 (L) 09/08/2018 0218   HCT 41.3 10/14/2014 0644   PLT 190 09/08/2018 0218   PLT 156 10/14/2014 0644   BMET    Component Value Date/Time   NA 134 (L) 09/08/2018 0218   NA 142 10/14/2014 0644   K 4.4 09/08/2018 0218   K 3.6 10/14/2014 0644   CL 104 09/08/2018 0218   CL 109 10/14/2014 0644   CO2 24 09/08/2018 0218   CO2 29 10/14/2014 0644   GLUCOSE 226 (H) 09/08/2018 0218   GLUCOSE 142 (H) 10/14/2014  0644   BUN 17 09/08/2018 0218   BUN 9 10/14/2014 0644   CREATININE 0.60 09/08/2018 0218   CREATININE 0.53 10/14/2014 0644   CALCIUM 7.5 (L) 09/08/2018 0218   CALCIUM 7.7 (L) 10/14/2014 0644   GFRNONAA >60 09/08/2018 0218   GFRNONAA >60 10/14/2014 0644   GFRAA >60 09/08/2018 0218   GFRAA >60 10/14/2014 0644   Assessment/Planning: The patient is a 63 year old female admitted for sepsis / left groin wound infection s/p debridement and reconstruction - POD #1 1) VAC intact and to suction - First VAC change in OR Wed with Dr. Wyn Quaker 2) Extremities are warm, amputation sites are intact without breakdown of tissue 3) Sacral wound as per general surgery 4) WBC trending down 5) Hbg stable  Discussed with Dr. Wallis Mart Ernestyne Caldwell PA-C 09/08/2018 10:11 AM

## 2018-09-08 NOTE — Progress Notes (Signed)
Patient ID: April [name], female   DOB: Jun 18, 1956, 63 y.o.   MRN: [id number]  [employer] PROGRESS NOTE  April [name] XUX:[id number] DOB: 26-Feb-1956 DOA: 09/05/2018 PCP: Mickel Fuchs, MD  HPI/Subjective: Patient awakened from sleep and then she states that she is in severe pain.  Nursing staff concerned that she is lethargic and and her respirations go down with pain medications.  Objective: Vitals:   09/08/18 0615 09/08/18 1251  BP: 101/60 (!) 101/58  Pulse: (!) 125 98  Resp: 20 (!) 26  Temp: 98.9 F (37.2 C) 98.6 F (37 C)  SpO2: 95% 98%    Filed Weights   09/05/18 1056 09/05/18 1957  Weight: 39 kg 39 kg    ROS: Review of Systems  Unable to perform ROS: Acuity of condition  Respiratory: Negative for shortness of breath.   Cardiovascular: Negative for chest pain.  Gastrointestinal: Negative for abdominal pain.  Musculoskeletal: Positive for joint pain.   Exam: Physical Exam  Constitutional: She appears lethargic.  HENT:  Nose: No mucosal edema.  Mouth/Throat: No oropharyngeal exudate.  Eyes: Pupils are equal, round, and reactive to light. Conjunctivae and lids are normal.  Neck: Carotid bruit is not present. No thyromegaly present.  Cardiovascular: Regular rhythm, S1 normal, S2 normal and normal heart sounds.  Respiratory: She has no decreased breath sounds. She has no wheezes. She has no rhonchi. She has no rales.  GI: Soft. Bowel sounds are normal. There is no abdominal tenderness.  Musculoskeletal:     Left knee: She exhibits no swelling.     Right ankle: She exhibits no swelling.  Lymphadenopathy:    She has no cervical adenopathy.  Neurological: She appears lethargic.  Skin: Skin is warm. Nails show no clubbing.  Slight erythema over the skin of the left hip but no breaks in the skin on the left buttock or left hip area.  She does have a stage II decubitus on the buttock.  Left thigh covered with wound VAC.  Psychiatric:  Lethargic       Data Reviewed: Basic Metabolic Panel: Recent Labs  Lab 09/05/18 1426 09/07/18 0431 09/08/18 0218  NA 126* 131* 134*  K 5.0 4.5 4.4  CL 89* 101 104  CO2 27 24 24   GLUCOSE 544* 152* 226*  BUN 28* 24* 17  CREATININE 0.83 0.64 0.60  CALCIUM 7.7* 7.6* 7.5*   CBC: Recent Labs  Lab 09/05/18 1141 09/07/18 0431 09/08/18 0218  WBC 18.4* 14.4* 13.8*  NEUTROABS 16.7*  --   --   HGB 13.3 8.9* 9.2*  HCT 42.5 28.6* 29.8*  MCV 96.8 97.3 98.0  PLT 302 188 190   BNP (last 3 results) Recent Labs    06/10/18 0621  BNP 811.0*     CBG: Recent Labs  Lab 09/07/18 1712 09/07/18 1816 09/07/18 2049 09/08/18 0740 09/08/18 1154  GLUCAP 115* 118* 119* 268* 202*    Recent Results (from the past 240 hour(s))  Blood culture (routine x 2)     Status: None (Preliminary result)   Collection Time: 09/05/18 11:42 AM  Result Value Ref Range Status   Specimen Description BLOOD RFA  Final   Special Requests   Final    BOTTLES DRAWN AEROBIC AND ANAEROBIC Blood Culture adequate volume   Culture   Final    NO GROWTH 3 DAYS Performed at [facility], [address] [place], [place] [address]    Report Status PENDING  Incomplete  Blood culture (routine x 2)  Status: Abnormal   Collection Time: 09/05/18 12:24 PM  Result Value Ref Range Status   Specimen Description   Final    BLOOD BLOOD RIGHT HAND Performed at [facility], [address] [place], [place] [address]    Special Requests   Final    BOTTLES DRAWN AEROBIC ONLY Blood Culture adequate volume Performed at [facility], [address] [place], [place] [address]    Culture  Setup Time   Final    GRAM POSITIVE COCCI IN CLUSTERS AEROBIC BOTTLE ONLY CRITICAL RESULT CALLED TO, READ BACK BY AND VERIFIED WITH: Kevin Fenton PATEL  09/06/18 AKT    Culture (A)  Final    STAPHYLOCOCCUS SPECIES (COAGULASE NEGATIVE) THE SIGNIFICANCE OF ISOLATING THIS ORGANISM FROM A SINGLE SET OF BLOOD CULTURES  WHEN MULTIPLE SETS ARE DRAWN IS UNCERTAIN. PLEASE NOTIFY THE MICROBIOLOGY DEPARTMENT WITHIN ONE WEEK IF SPECIATION AND SENSITIVITIES ARE REQUIRED. Performed at [facility], [address], [place], [place] [address]    Report Status 09/08/2018 FINAL  Final  Blood Culture ID Panel (Reflexed)     Status: Abnormal   Collection Time: 09/05/18 12:24 PM  Result Value Ref Range Status   Enterococcus species NOT DETECTED NOT DETECTED Final   Listeria monocytogenes NOT DETECTED NOT DETECTED Final   Staphylococcus species DETECTED (A) NOT DETECTED Final    Comment: Methicillin (oxacillin) resistant coagulase negative staphylococcus. Possible blood culture contaminant (unless isolated from more than one blood culture draw or clinical case suggests pathogenicity). No antibiotic treatment is indicated for blood  culture contaminants. CRITICAL RESULT CALLED TO, READ BACK BY AND VERIFIED WITH: Kevin Fenton PATEL  09/06/18 AKT    Staphylococcus aureus (BCID) NOT DETECTED NOT DETECTED Final   Methicillin resistance DETECTED (A) NOT DETECTED Final    Comment: CRITICAL RESULT CALLED TO, READ BACK BY AND VERIFIED WITH: KISHAN PATEL  09/06/18 AKT    Streptococcus species NOT DETECTED NOT DETECTED Final   Streptococcus agalactiae NOT DETECTED NOT DETECTED Final   Streptococcus pneumoniae NOT DETECTED NOT DETECTED Final   Streptococcus pyogenes NOT DETECTED NOT DETECTED Final   Acinetobacter baumannii NOT DETECTED NOT DETECTED Final   Enterobacteriaceae species NOT DETECTED NOT DETECTED Final   Enterobacter cloacae complex NOT DETECTED NOT DETECTED Final   Escherichia coli NOT DETECTED NOT DETECTED Final   Klebsiella oxytoca NOT DETECTED NOT DETECTED Final   Klebsiella pneumoniae NOT DETECTED NOT DETECTED Final   Proteus species NOT DETECTED NOT DETECTED Final   Serratia marcescens NOT DETECTED NOT DETECTED Final   Haemophilus influenzae NOT DETECTED NOT DETECTED Final   Neisseria meningitidis NOT  DETECTED NOT DETECTED Final   Pseudomonas aeruginosa NOT DETECTED NOT DETECTED Final   Candida albicans NOT DETECTED NOT DETECTED Final   Candida glabrata NOT DETECTED NOT DETECTED Final   Candida krusei NOT DETECTED NOT DETECTED Final   Candida parapsilosis NOT DETECTED NOT DETECTED Final   Candida tropicalis NOT DETECTED NOT DETECTED Final    Comment: Performed at [facility], [address] [place], [place] [address]  Aerobic/Anaerobic Culture (surgical/deep wound)     Status: None (Preliminary result)   Collection Time: 09/06/18  1:14 PM  Result Value Ref Range Status   Specimen Description   Final    WOUND GROIN Performed at [facility], [address] [place], [place] [address]    Special Requests   Final    NONE Performed at [facility], [address] [place], [place] [address]  Gram Stain   Final    FEW WBC PRESENT, PREDOMINANTLY PMN MODERATE GRAM POSITIVE COCCI IN PAIRS FEW BUDDING YEAST SEEN FEW GRAM POSITIVE RODS    Culture   Final    MODERATE METHICILLIN RESISTANT STAPHYLOCOCCUS AUREUS HOLDING FOR POSSIBLE ANAEROBE Performed at [facility], [address], [place], [place] [address]    Report Status PENDING  Incomplete   Organism ID, Bacteria METHICILLIN RESISTANT STAPHYLOCOCCUS AUREUS  Final      Susceptibility   Methicillin resistant staphylococcus aureus - MIC*    CIPROFLOXACIN >=8 RESISTANT Resistant     ERYTHROMYCIN >=8 RESISTANT Resistant     GENTAMICIN <=0.5 SENSITIVE Sensitive     OXACILLIN >=4 RESISTANT Resistant     TETRACYCLINE <=1 SENSITIVE Sensitive     VANCOMYCIN 1 SENSITIVE Sensitive     TRIMETH/SULFA <=10 SENSITIVE Sensitive     CLINDAMYCIN >=8 RESISTANT Resistant     RIFAMPIN <=0.5 SENSITIVE Sensitive     Inducible Clindamycin NEGATIVE Sensitive     * MODERATE METHICILLIN RESISTANT STAPHYLOCOCCUS AUREUS  Aerobic/Anaerobic Culture (surgical/deep wound)     Status: None (Preliminary result)    Collection Time: 09/07/18  3:08 PM  Result Value Ref Range Status   Specimen Description   Final    WOUND GROIN LEFT GROIN Performed at [facility], [address], [place], [place] [address]    Special Requests   Final    NONE Performed at [facility], [address] [place], [place] [address]    Gram Stain   Final    ABUNDANT WBC PRESENT, PREDOMINANTLY PMN MODERATE GRAM POSITIVE COCCI MODERATE YEAST Performed at [facility], [address], [place], [place] [address]    Culture PENDING  Incomplete   Report Status PENDING  Incomplete  Aerobic/Anaerobic Culture (surgical/deep wound)     Status: None (Preliminary result)   Collection Time: 09/07/18  3:09 PM  Result Value Ref Range Status   Specimen Description   Final    WOUND FEMORAL ARTERY LEFT GRAFT Performed at [facility], [address], [place], [place] [address]    Special Requests   Final    NONE Performed at [facility], [address] [place], [place] [address]    Gram Stain   Final    MODERATE WBC PRESENT,BOTH PMN AND MONONUCLEAR NO ORGANISMS SEEN    Culture   Final    TOO YOUNG TO READ Performed at [facility], [address], [place], [place] [address]    Report Status PENDING  Incomplete     Studies: Mr Pelvis Wo Contrast  Result Date: 09/07/2018 CLINICAL DATA:  Evaluate gluteal fluid collection. Recent bilateral femoral endarterectomies with left groin infection. EXAM: MRI PELVIS WITHOUT CONTRAST TECHNIQUE: Multiplanar multisequence MR imaging of the pelvis was performed. No intravenous contrast was administered. COMPARISON:  CT pelvis dated September 05, 2018. FINDINGS: Urinary Tract: No abnormality visualized. Foley catheter within the decompressed bladder. Bowel:  Unremarkable visualized pelvic bowel loops. Vascular/Lymphatic: 2.6 x 3.2 x 5.0 cm heterogeneous fluid collection adjacent to the distal left common femoral artery, draining to the skin. No  enlarged pelvic lymph nodes. Reproductive:  No mass or other significant abnormality. Other:  Small amount of presacral and pelvic fluid. Musculoskeletal: No suspicious marrow signal abnormality. No hip joint effusion. No acute fracture or dislocation. Prominent asymmetric enlargement of the left gluteus maximus muscle which is edematous and demonstrates patchy areas of T1 hyperintensity. There is some ill-defined  fluid within the muscle, but no discrete drainable fluid collection. There is also edema within the proximal left gluteus minimus and medius muscles, and the left adductor musculature. Diffuse anasarca with slightly asymmetric soft tissue swelling of the left proximal thigh. Small midline sacral decubitus ulcer. IMPRESSION: 1. Enlarged, edematous left gluteus maximus muscle with areas of patchy T1 hyperintensity, likely reflecting a combination of intramuscular hemorrhage and myositis. No discrete abscess or hematoma. 2. 5.0 cm heterogeneous fluid collection adjacent to the distal left femoral artery, concerning for abscess. 3. Small midline sacral decubitus ulcer without evidence of underlying osteomyelitis. 4. Diffuse anasarca with asymmetric soft tissue swelling of the left proximal thigh, which could reflect superimposed cellulitis. Correlate with physical exam. Electronically Signed   By: Obie Dredge M.D.   On: 09/07/2018 10:53    Scheduled Meds: . aspirin  81 mg Oral Daily  . atorvastatin  40 mg Oral Daily  . collagenase   Topical Daily  . docusate sodium  100 mg Oral Daily  . [START ON 09/09/2018] enoxaparin (LOVENOX) injection  30 mg Subcutaneous Q24H  . ferrous sulfate  325 mg Oral BID WC  . gabapentin  100 mg Oral QHS  . insulin aspart  0-5 Units Subcutaneous QHS  . insulin aspart  0-9 Units Subcutaneous TID WC  . insulin glargine  15 Units Subcutaneous QHS  . midodrine  10 mg Oral TID WC  . mometasone-formoterol  2 puff Inhalation BID  . multivitamin-lutein  1 capsule Oral  Daily  . pantoprazole  40 mg Oral Daily  . polyethylene glycol  17 g Oral Daily  . povidone-iodine   Topical BID  . Ensure Max Protein  11 oz Oral BID  . tamsulosin  0.4 mg Oral Daily  . ascorbic acid  500 mg Oral BID   Continuous Infusions: . sodium chloride 250 mL (09/08/18 0651)  . sodium chloride    . dextrose 5 % and 0.45% NaCl 75 mL/hr at 09/08/18 1342  . magnesium sulfate 1 - 4 g bolus IVPB    . vancomycin 750 mg (09/08/18 1429)    Assessment/Plan:  1. Clinical sepsis with left groin infection.  MRSA growing out of wound culture.  On vancomycin.  Blood culture likely skin contamination. 2. Left buttock hematoma.  Xarelto on hold. 3. Hyponatremia.  Improved with IV fluid 4. Type 2 diabetes mellitus on low-dose glargine and sliding scale 5. Stage II decubiti local wound care 6. Severe malnutrition 7. Chronic hypotension on midodrine 8. Hyperlipidemia unspecified on atorvastatin 9. Diabetic neuropathy on gabapentin 10. GERD on PPI 11. Peripheral vascular disease.  Holding Xarelto at this time.  Left leg amputation site looks good. 12. Pain control will be an issue because she gets decreased respirations once given pain medications.  Try to convert over to Tylenol as quickly as possible.  Trial of tramadol instead.  Code Status:     Code Status Orders  (From admission, onward)         Start     Ordered   09/05/18 1941  Full code  Continuous     09/05/18 1940        Code Status History    Date Active Date Inactive Code Status Order ID Comments User Context   08/21/2018 1658 08/25/2018 1932 DNR [id number]  Adrian Saran, MD ED   08/21/2018 1656 08/21/2018 1658 DNR [id number]  Adrian Saran, MD ED   07/21/2018 1302 07/26/2018 2148 DNR [id number]  Glee Arvin, NP Inpatient   07/09/2018  1730 07/21/2018 1302 Partial Code [id number]  Campbell Stall, MD Inpatient   06/03/2018 0621 06/13/2018 2013 Full Code [id number]  Arnaldo Natal, MD Inpatient   05/23/2018 0253  06/01/2018 2158 Full Code [id number]  Cammy Copa, MD Inpatient   05/23/2018 0138 05/23/2018 0253 DNR [id number]  Cammy Copa, MD ED   04/10/2018 1111 04/11/2018 1554 DNR [id number]  Milagros Loll, MD Inpatient   04/09/2018 1519 04/10/2018 1111 Full Code [id number]  Milagros Loll, MD Inpatient   04/09/2018 1337 04/09/2018 1519 DNR [id number]  Milagros Loll, MD Inpatient   04/09/2018 0012 04/09/2018 1337 Full Code [id number]  Oralia Manis, MD ED     Family Communication: Spoke with daughter yesterday Disposition Plan: To be determined  Consultants:  Vascular surgery  General surgery  Antibiotics:  Vancomycin  Time spent: 27 minutes, case discussed with pharmacist and nursing staff  Alford Highland  [employer] [facility]

## 2018-09-08 NOTE — Evaluation (Signed)
Occupational Therapy Evaluation Patient Details Name: April [name] MRN: [id number] DOB: 09-Dec-1955 Today's Date: 09/08/2018    History of Present Illness Pt is a 63 y.o. female s/p  Left groin wound exploration, sharp debridement, & removal of infected femoral enarterectomy patch. Pt presented to [facility] 09/07/18 with AMS status complaining of pain in groin and buttocks. 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   Clinical Impression   Pt seen for OT evaluation this date, POD#1 from above surgery. At baseline, pt required assistance in most ADLs and IADLs. Pt endorsed that her granddaughter assisted her regularly with "cleaning the house and cooking". Pt limited by pain and cognitive status at time of evaluation. A&O to self, place, and situation but unable to give date. Stated the year was "20,000" and could not provide day of week or month. Pt intermittently closing eyes t/o session. Required max VCs in order to attend to OT evaluation. Limited mobility for pt safety. Unable to formally assess ADLs. Per chart review pt required mod/max assist for ambulation. Pt endorses using both a WC and RW for mobility, but chart review indicates her family members were providing physical assistance by carrying her within her home. Pt currently requires max assist for LB ADLs while in at bed level due to pain, limited balance, and limited cognition. Pt instructed in desensitization strategies for management of residual limb, otherwise education limited d/t pt fatigue.  Pt would benefit from skilled OT services including additional instruction in dressing techniques with or without assistive devices for dressing and bathing skills to support recall and carryover prior to discharge and ultimately to maximize safety, independence, and minimize falls risk and caregiver burden. Recommending STR upon hospital DC.    Follow Up Recommendations  SNF;Supervision/Assistance - 24 hour     Equipment Recommendations  (TBD)    Recommendations for Other Services       Precautions / Restrictions Precautions Precautions: Fall Restrictions Weight Bearing Restrictions: Yes RLE Weight Bearing: Non weight bearing LLE Weight Bearing: (L BKA) Other Position/Activity Restrictions: L LE BKA 07/24/18; pt reports NWB R LE d/t R foot wounds      Mobility Bed Mobility               General bed mobility comments: Deferred for pt safety. Pt limited by pain and cognitive status at time of evaluation.   Transfers                 General transfer comment: Deferred for pt safety. Pt limited by pain and cognitive status at time of evaluation.     Balance                                           ADL either performed or assessed with clinical judgement   ADL Overall ADL's : Needs assistance/impaired Eating/Feeding: Set up;Supervision/ safety   Grooming: Set up;Minimal assistance;Bed level   Upper Body Bathing: Set up;Maximal assistance;Bed level   Lower Body Bathing: Set up;Maximal assistance;Bed level   Upper Body Dressing : Set up;Moderate assistance;Bed level   Lower Body Dressing: Set up;Bed level;Maximal assistance   Toilet Transfer: Set up;Maximal assistance Toilet Transfer Details (indicate cue type and reason): Pt with catheter in place at time of evaluation. Suspect max assist for bed-level toileting. Pt may be able to assist with bed mobility when not  limited by lines/leads however cognitive status may also impact ability to participate in ADLs.            General ADL Comments: Pt limited in ADL participation by pain, cognitive status, as well as L BKA and R foot injuries at time of evaluation. Per chart review pt required mod/max assist at baseline for LB ADLs. Limitations noted in UE functional use at this time which would further impact pt ability to participate in ADLs.      Vision Baseline Vision/History: (Pt does not wear  glasses. Reports hx of blurry/double vision. ) Patient Visual Report: Blurring of vision;Diplopia Additional Comments: Pt endorses worsening double/blurry vision since admission.      Perception     Praxis      Pertinent Vitals/Pain Pain Score: 8  Pain Location: L BKA site Pain Descriptors / Indicators: Grimacing;Operative site guarding Pain Intervention(s): Limited activity within patient's tolerance;Monitored during session;Patient requesting pain meds-RN notified;Premedicated before session     Hand Dominance Right   Extremity/Trunk Assessment Upper Extremity Assessment Upper Extremity Assessment: Generalized weakness(Pt grossly 3-/5 in BUE. Pt unable to demonstrate reaching head. May be d/t cognitive status at time of eval. )   Lower Extremity Assessment Lower Extremity Assessment: Generalized weakness;Defer to PT evaluation(Pain limited on this date. )       Communication Communication Communication: HOH   Cognition Arousal/Alertness: Lethargic Behavior During Therapy: Flat affect Overall Cognitive Status: No family/caregiver present to determine baseline cognitive functioning                                 General Comments: Pt very lethargic at time of evaluation consistently closing eyes and had limited engagement with therapist.    General Comments       Exercises Other Exercises Other Exercises: Pt educated on cognitive behavioral pain mgmt strategies as well as residual limb care including gentle tapping and scar massage as L BKA heals.    Shoulder Instructions      Home Living Family/patient expects to be discharged to:: Private residence Living Arrangements: Children Available Help at Discharge: Friend(s);Family;Available 24 hours/day Type of Home: Apartment Home Access: Stairs to enter Entrance Stairs-Number of Steps: 4 Entrance Stairs-Rails: Right;Left;Can reach both Home Layout: One level               Home Equipment: Walker -  2 wheels;Cane - single point;Bedside commode;Wheelchair - manual          Prior Functioning/Environment Level of Independence: Needs assistance  Gait / Transfers Assistance Needed: Per chart review, pt recently endorsed family members assist her with mobility by carrying her to transfer to different surfaces d/t L BKA and R foot wounds.  ADL's / Homemaking Assistance Needed: Pt endorses that her granddaughter provides assistance with cooking, cleaning, and occasionally bathing and dressing.    Comments: Pt very lethargic at time of evaluation consistently closing eyes and had limited engagement with therapist.  Majority PLOF information obtained from chart review.         OT Problem List: Decreased strength;Decreased range of motion;Decreased activity tolerance;Decreased coordination;Impaired vision/perception;Impaired balance (sitting and/or standing);Decreased cognition;Decreased safety awareness;Decreased knowledge of use of DME or AE;Decreased knowledge of precautions;Pain;Impaired UE functional use      OT Treatment/Interventions: Self-care/ADL training;Therapeutic exercise;Energy conservation;Therapeutic activities;DME and/or AE instruction;Cognitive remediation/compensation;Patient/family education    OT Goals(Current goals can be found in the care plan section) Acute Rehab OT Goals Patient Stated Goal: to  improve strength OT Goal Formulation: With patient Time For Goal Achievement: 09/22/18 Potential to Achieve Goals: Fair ADL Goals Pt Will Perform Grooming: with min assist;sitting;with adaptive equipment(With LRAD for safety and improved functional independence.) Pt Will Perform Upper Body Dressing: sitting;with adaptive equipment;with min assist(With LRAD for safety and improved functional independence.) Pt Will Perform Lower Body Dressing: with min assist;sitting/lateral leans;with adaptive equipment(With LRAD for safety and improved functional independence.) Pt Will Transfer  to Toilet: stand pivot transfer;with mod assist;bedside commode(With LRAD for safety and improved functional independence.)  OT Frequency: Min 2X/week   Barriers to D/C: Inaccessible home environment  Pt with 4 steps to enter the home.        Co-evaluation              AM-PAC OT "6 Clicks" Daily Activity     Outcome Measure Help from another person eating meals?: A Little Help from another person taking care of personal grooming?: A Little Help from another person toileting, which includes using toliet, bedpan, or urinal?: A Lot Help from another person bathing (including washing, rinsing, drying)?: A Lot Help from another person to put on and taking off regular upper body clothing?: A Lot Help from another person to put on and taking off regular lower body clothing?: A Lot 6 Click Score: 14   End of Session Nurse Communication: Patient requests pain meds  Activity Tolerance: Patient limited by lethargy;Patient limited by pain Patient left: in bed;with bed alarm set;with SCD's reapplied;with call bell/phone within reach(With wound VAC in place. )  OT Visit Diagnosis: Other abnormalities of gait and mobility (R26.89);History of falling (Z91.81);Pain Pain - Right/Left: Left Pain - part of body: Leg                Time: 1610-9604 OT Time Calculation (min): 29 min Charges:  OT General Charges $OT Visit: 1 Visit OT Evaluation $OT Eval Moderate Complexity: 1 Mod OT Treatments $Self Care/Home Management : 8-22 mins  Rockney Ghee, M.S., OTR/L Ascom: [phone] 09/08/18, 10:35 AM

## 2018-09-08 NOTE — Progress Notes (Signed)
Pharmacy Antibiotic Note  April [name] is a 63 y.o. female admitted on 09/05/2018 with Unknown Source.  Pharmacy has been consulted for vancomycin dosing. Her SCr has returned to baseline since admission. She has received 2 maintenance doses of 500mg . Leukocytosis is improving but not yet resolved.  Plan: 1) Increase Vancomycin to 750 mg IV Q 24 hrs Goal AUC 400-550. Expected AUC: 517 SCr used: 0.80 (rounded up from 0.60)  Height: 5\' 2"  (157.5 cm) Weight: 85 lb 15.7 oz (39 kg) IBW/kg (Calculated) : 50.1  Temp (24hrs), Avg:98.3 F (36.8 C), Min:97.8 F (36.6 C), Max:98.9 F (37.2 C)  Recent Labs  Lab 09/05/18 1141 09/05/18 1225 09/05/18 1426 09/05/18 1549 09/07/18 0431 09/08/18 0218  WBC 18.4*  --   --   --  14.4* 13.8*  CREATININE  --   --  0.83  --  0.64 0.60  LATICACIDVEN  --  2.1*  --  1.6  --   --     Estimated Creatinine Clearance: 44.3 mL/min (by C-G formula based on SCr of 0.6 mg/dL).    Antimicrobials this admission: 3/6 pip/tazo x 1 3/6 cefepime >> 3/9 3/6 metronidazole 3/9 3/6 vancomycin >>   Microbiology results: 3/7 WCx groin: MRSA 3/8 WOUND FEMORAL ARTERY LEFT GRAFT: pending 3/8 WOUND GROIN LEFT: gpc 3/6 BCx coag(-) staph 1/4  Thank you for allowing pharmacy to be a part of this patient's care.  Lowella Bandy, PharmD Clinical Pharmacist  09/08/2018 7:34 AM

## 2018-09-08 NOTE — Progress Notes (Signed)
PHARMACIST - PHYSICIAN COMMUNICATION  CONCERNING:  Enoxaparin (Lovenox) for DVT Prophylaxis   RECOMMENDATION: Patient was prescribed enoxaprin 40mg  q24 hours for VTE prophylaxis.   Filed Weights   09/05/18 1056 09/05/18 1957  Weight: 86 lb (39 kg) 85 lb 15.7 oz (39 kg)    Body mass index is 15.73 kg/m.  Estimated Creatinine Clearance: 44.3 mL/min (by C-G formula based on SCr of 0.6 mg/dL).  Patient is candidate for enoxaparin 30mg  every 24 hours based on CrCl <73ml/min or Weight less then 45kg for female and 50kg for female  DESCRIPTION: Pharmacy has adjusted enoxaparin dose per [facility] policy.  Patient is now receiving enoxaparin 30mg  every 24 hours.  Lowella Bandy, PharmD Clinical Pharmacist  09/08/2018 10:20 AM

## 2018-09-08 NOTE — NC FL2 (Signed)
[place] MEDICAID FL2 LEVEL OF CARE SCREENING TOOL     IDENTIFICATION  Patient Name: April [name] Birthdate: 01/26/1956 Sex: female Admission Date (Current Location): 09/05/2018  [facility] and [name] Number:  Chiropodist and Address:  [facility], [address] [place], [place] [address]      Provider Number: [id number]  Attending Physician Name and Address:  Alford Highland, MD  Relative Name and Phone Number:       Current Level of Care: Hospital Recommended Level of Care: Skilled Nursing Facility Prior Approval Number:    Date Approved/Denied:   PASRR Number:    Discharge Plan: SNF    Current Diagnoses: Patient Active Problem List   Diagnosis Date Noted  . Sepsis (HCC) 09/05/2018  . PAD (peripheral artery disease) (HCC) 08/25/2018  . History of left below knee amputation (HCC) 08/25/2018  . Surgical site infection 08/25/2018  . Pressure injury of skin 08/22/2018  . COPD exacerbation (HCC) 08/21/2018  . Diabetic foot infection (HCC) 07/09/2018  . Acute on chronic respiratory failure with hypoxemia (HCC) 06/03/2018  . Acute respiratory failure with hypoxia and hypercapnia (HCC)   . Acute osteomyelitis of right foot (HCC) 05/23/2018  . Protein-calorie malnutrition, severe 04/10/2018  . Diabetes (HCC) 04/08/2018  . COPD (chronic obstructive pulmonary disease) (HCC) 04/08/2018  . HLD (hyperlipidemia) 04/08/2018  . Diabetic foot ulcer (HCC) 04/08/2018  . 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 Elevated CEA 01/30/2014    Orientation RESPIRATION BLADDER Height & Weight     Self, Time, Situation, Place  Normal Incontinent Weight: 85 lb 15.7 oz (39 kg) Height:   (157.5 cm)  BEHAVIORAL SYMPTOMS/MOOD NEUROLOGICAL BOWEL NUTRITION STATUS  (none) (none) Incontinent Diet(heart health carb modified)  AMBULATORY STATUS COMMUNICATION OF NEEDS Skin   Extensive Assist Verbally  Normal                       Personal Care Assistance Level of Assistance  Bathing, Dressing Bathing Assistance: Limited assistance   Dressing Assistance: Limited assistance     Functional Limitations Info  (none)          SPECIAL CARE FACTORS FREQUENCY  PT (By licensed PT)(wound vac needed at discharge)                    Contractures Contractures Info: Not present    Additional Factors Info  Code Status, Isolation Precautions Code Status Info: full       Isolation Precautions Info: mrsa     Current Medications (09/08/2018):  This is the current hospital active medication list Current Facility-Administered Medications  Medication Dose Route Frequency Provider Last Rate Last Dose  . 0.9 %  sodium chloride infusion   Intravenous PRN Angelina Ok D, MD 10 mL/hr at 09/08/18 0651 250 mL at 09/08/18 0651  . 0.9 %  sodium chloride infusion  500 mL Intravenous Once PRN Louisa Second, MD      . acetaminophen (TYLENOL) tablet 325-650 mg  325-650 mg Oral Q4H PRN Louisa Second, MD       Or  . acetaminophen (TYLENOL) suppository 325-650 mg  325-650 mg Rectal Q4H PRN Louisa Second, MD      . ALPRAZolam Prudy Feeler) tablet 0.5 mg  0.5 mg Oral BID PRN Angelina Ok D, MD   0.5 mg at 09/06/18 0239  . alum & mag hydroxide-simeth (MAALOX/MYLANTA) 200-200-20 MG/5ML suspension 15-30 mL  15-30 mL Oral  Q2H PRN Louisa Second, MD      . aspirin chewable tablet 81 mg  81 mg Oral Daily Salary, Montell D, MD   81 mg at 09/08/18 0814  . atorvastatin (LIPITOR) tablet 40 mg  40 mg Oral Daily Salary, Montell D, MD   40 mg at 09/07/18 2053  . collagenase (SANTYL) ointment   Topical Daily Salary, Montell D, MD      . dextrose 5 %-0.45 % sodium chloride infusion   Intravenous Continuous Louisa Second, MD 75 mL/hr at 09/07/18 2222    . docusate sodium (COLACE) capsule 100 mg  100 mg Oral Daily Louisa Second, MD   100 mg at 09/08/18 0816  . [START ON 09/09/2018] enoxaparin (LOVENOX)  injection 30 mg  30 mg Subcutaneous Q24H Lowella Bandy, RPH      . ferrous sulfate tablet 325 mg  325 mg Oral BID WC Salary, Montell D, MD   325 mg at 09/08/18 0836  . gabapentin (NEURONTIN) capsule 100 mg  100 mg Oral QHS Salary, Montell D, MD   100 mg at 09/07/18 2214  . guaiFENesin-dextromethorphan (ROBITUSSIN DM) 100-10 MG/5ML syrup 15 mL  15 mL Oral Q4H PRN Louisa Second, MD      . hydrALAZINE (APRESOLINE) injection 5 mg  5 mg Intravenous Q20 Min PRN Louisa Second, MD   5 mg at 09/07/18 1901  . HYDROcodone-acetaminophen (NORCO/VICODIN) 5-325 MG per tablet 1 tablet  1 tablet Oral Q6H PRN Wieting, Richard, MD      . insulin aspart (novoLOG) injection 0-5 Units  0-5 Units Subcutaneous QHS Wieting, Richard, MD      . insulin aspart (novoLOG) injection 0-9 Units  0-9 Units Subcutaneous TID WC Alford Highland, MD   5 Units at 09/08/18 0815  . insulin glargine (LANTUS) injection 15 Units  15 Units Subcutaneous QHS Angelina Ok D, MD   15 Units at 09/07/18 2218  . ipratropium-albuterol (DUONEB) 0.5-2.5 (3) MG/3ML nebulizer solution 3 mL  3 mL Nebulization Q4H PRN Wieting, Richard, MD      . labetalol (NORMODYNE,TRANDATE) injection 10 mg  10 mg Intravenous Q10 min PRN Louisa Second, MD      . magnesium sulfate IVPB 2 g 50 mL  2 g Intravenous Daily PRN Louisa Second, MD      . metoprolol tartrate (LOPRESSOR) injection 2-5 mg  2-5 mg Intravenous Q2H PRN Louisa Second, MD      . midodrine (PROAMATINE) tablet 10 mg  10 mg Oral TID WC Salary, Montell D, MD   10 mg at 09/08/18 0837  . mometasone-formoterol (DULERA) 200-5 MCG/ACT inhaler 2 puff  2 puff Inhalation BID Angelina Ok D, MD   2 puff at 09/08/18 [id number]  . multivitamin-lutein (OCUVITE-LUTEIN) capsule 1 capsule  1 capsule Oral Daily Wieting, Richard, MD   1 capsule at 09/08/18 0837  . naloxone [facility]) injection 0.4 mg  0.4 mg Intravenous PRN Alford Highland, MD      . ondansetron (ZOFRAN) tablet 4 mg  4 mg Oral Q6H PRN Salary,  Montell D, MD      . pantoprazole (PROTONIX) EC tablet 40 mg  40 mg Oral Daily Louisa Second, MD   40 mg at 09/08/18 0815  . phenol (CHLORASEPTIC) mouth spray 1 spray  1 spray Mouth/Throat PRN Louisa Second, MD      . polyethylene glycol (MIRALAX / GLYCOLAX) packet 17 g  17 g Oral Daily Salary, Montell D, MD   17 g at 09/08/18 6384  . polyethylene glycol (  MIRALAX / GLYCOLAX) packet 17 g  17 g Oral Daily PRN Salary, Montell D, MD      . potassium chloride SA (K-DUR,KLOR-CON) CR tablet 20-40 mEq  20-40 mEq Oral Daily PRN Louisa Second, MD      . povidone-iodine (BETADINE) 10 % external solution   Topical BID Louisa Second, MD      . protein supplement (ENSURE MAX) liquid  11 oz Oral BID Wieting, Richard, MD      . tamsulosin (FLOMAX) capsule 0.4 mg  0.4 mg Oral Daily Salary, Montell D, MD   0.4 mg at 09/06/18 0854  . traMADol (ULTRAM) tablet 50 mg  50 mg Oral Q6H PRN Salary, Montell D, MD   50 mg at 09/08/18 0815  . vancomycin (VANCOCIN) IVPB 750 mg/150 ml premix  750 mg Intravenous Q24H Lowella Bandy, RPH      . vitamin C (ASCORBIC ACID) tablet 500 mg  500 mg Oral BID Angelina Ok D, MD   500 mg at 09/08/18 3976     Discharge Medications: Please see discharge summary for a list of discharge medications.  Relevant Imaging Results:  Relevant Lab Results:   Additional Information SS: [id number]  York Spaniel, LCSW

## 2018-09-09 DIAGNOSIS — Z515 Encounter for palliative care: Secondary | ICD-10-CM

## 2018-09-09 DIAGNOSIS — Z7189 Other specified counseling: Secondary | ICD-10-CM

## 2018-09-09 LAB — GLUCOSE, CAPILLARY
Glucose-Capillary: 90 mg/dL (ref 70–99)
Glucose-Capillary: 82 mg/dL (ref 70–99)
Glucose-Capillary: 292 mg/dL — ABNORMAL HIGH (ref 70–99)
Glucose-Capillary: 147 mg/dL — ABNORMAL HIGH (ref 70–99)

## 2018-09-09 LAB — SURGICAL PATHOLOGY

## 2018-09-09 MED ORDER — METOPROLOL TARTRATE 5 MG/5ML IV SOLN
5.0000 mg | Freq: Four times a day (QID) | INTRAVENOUS | Status: DC
  Filled 2018-09-09: qty 5

## 2018-09-09 MED ORDER — HYDROCODONE-ACETAMINOPHEN 7.5-325 MG PO TABS
1.0000 | ORAL_TABLET | ORAL | Status: DC | PRN
  Administered 2018-09-09 (×2): 1 via ORAL
  Filled 2018-09-09 (×2): qty 1

## 2018-09-09 MED ORDER — KETOROLAC TROMETHAMINE 30 MG/ML IJ SOLN
30.0000 mg | Freq: Two times a day (BID) | INTRAMUSCULAR | Status: DC | PRN
  Administered 2018-09-09 – 2018-09-13 (×6): 30 mg via INTRAVENOUS
  Filled 2018-09-09 (×7): qty 1

## 2018-09-09 MED ORDER — HYDROCODONE-ACETAMINOPHEN 7.5-325 MG PO TABS
1.0000 | ORAL_TABLET | Freq: Four times a day (QID) | ORAL | Status: DC | PRN
  Administered 2018-09-09 – 2018-09-13 (×11): 1 via ORAL
  Filled 2018-09-09 (×11): qty 1

## 2018-09-09 MED ORDER — SODIUM CHLORIDE 0.9 % IV SOLN
INTRAVENOUS | Status: DC
  Administered 2018-09-10 – 2018-09-13 (×6): via INTRAVENOUS

## 2018-09-09 NOTE — Progress Notes (Signed)
Patient has increasing confusion and agitation. She has frequently yelled out in pain all day but will often be asleep in between care. Orders for pain medication have had to be modified due to lethargy at times. Patient with a resting heart rate in the 140's. HR has been in the low 100's and upper 90's for the majority of this shift. MD on call paged and ordered PRN metoprolol. PRN pain medication has been given and HR is now 110's. Will continue to assess.

## 2018-09-09 NOTE — Care Management Note (Signed)
Case Management Note  Patient Details  Name: April [name] MRN: [id number] Date of Birth: [birth year]   Patient admitted from home.  1Days Post-Op: Left groin wound exploration,sharpdebridement, removal of infectedfemoral enarterectomypatch angioplasty   Patient lives at home with significant other and granddaughter.  In the home patient has WC, RW, BSC, shower seat, hospital bed and low air loss mattress.    Patient currently has wound vac in place and per MD patient will require wound vac at discharge. Patient and significant other have confirmed that patient will be returning home at discharge, and do not wish to purse SNF.    Fayrene Fearing with [facility] notified that wound vac will be needed at time of discharge.   Patient currently open with [facility] home health.  [place] with [facility] aware of admission.     Subjective/Objective:                    Action/Plan:   Expected Discharge Date:                  Expected Discharge Plan:  Home w Home Health Services  In-House Referral:     Discharge planning Services  CM Consult  Post Acute Care Choice:  Home Health, Resumption of Svcs/PTA Provider Choice offered to:     DME Arranged:    DME Agency:     HH Arranged:    HH Agency:  [facility]  Status of Service:  In process, will continue to follow  If discussed at Long Length of Stay Meetings, dates discussed:    Additional Comments:  Chapman Fitch, RN 09/09/2018, 5:16 PM

## 2018-09-09 NOTE — Consult Note (Signed)
Consultation Note Date: 09/09/2018   Patient Name: April [name]  DOB: 18-Oct-1955  MRN: [id number]  Age / Sex: 63 y.o., female  PCP: Mickel Fuchs, MD Referring Physician: Ramonita Lab, MD  Reason for Consultation: Establishing goals of care  HPI/Patient Profile: April [name]  is a 63 y.o. female with a known history history per below which includes left BKA, status post endarterectomy, presented to the emergency room left leg pain, drainage from left femoral wound from previous femoral endarterectomy site, work-up in the emergency room noted for gluteal abscess and possible left femoral abscess. Patient admitted for management.   Clinical Assessment and Goals of Care: Patient is resting in bed. She is sleepy but arouses to voice, and then falls back to sleep. Daughter and granddaughter at bedside. She has a roommate, and her 10 year old granddaughter stays with her to help her. She has a son whom she does not really see. Her daughter states there are family dynamic complications with the patient's son, and a 50B against him from the patient's roommate.   We discussed her diagnosis, prognosis, GOC, EOL wishes disposition and options.   The difference between an aggressive medical intervention path and a comfort care path was discussed.  Values and goals of care important to patient and family were attempted to be elicited.    Daughter states her mother has been suffering. She does not eat much and does not move around very well since her amputation. She is concerned about her quality of life with the amputation and the wound.   Patient did awaken to say she did not want her son invloved in her medical decision making should she become unable to make decisions. Consult placed for pastoral care to completed HPOA papers.     SUMMARY OF RECOMMENDATIONS   Plans for conversation tomorrow around 1-1:30.    Prognosis:   Poor overall      Primary Diagnoses: Present on Admission: . Sepsis (HCC)   I have reviewed the medical record, interviewed the patient and family, and examined the patient. The following aspects are pertinent.  Past Medical History:  Diagnosis Date  . Anxiety   . Colon polyps   . COPD (chronic obstructive pulmonary disease) (HCC)   . Diabetes mellitus without complication (HCC)   . DVT (deep venous thrombosis) (HCC)   . DVT (deep venous thrombosis) (HCC)   . History of colonic polyps   . Hyperlipidemia   . Peripheral artery disease (HCC)   . Pulmonary nodules/lesions, multiple 08/2012   NEGATIVE PET SCAN  . Retroperitoneal abscess (HCC)    history of in 04/2013  . Tobacco abuse    Social History   Socioeconomic History  . Marital status: Divorced    Spouse name: Not on file  . Number of children: Not on file  . Years of education: Not on file  . Highest education level: Not on file  Occupational History  . Not on file  Social Needs  . Financial resource strain: Not on file  .  Food insecurity:    Worry: Not on file    Inability: Not on file  . Transportation needs:    Medical: Not on file    Non-medical: Not on file  Tobacco Use  . Smoking status: Current Every Day Smoker    Packs/day: 1.50    Years: 47.00    Pack years: 70.50    Types: Cigarettes  . Smokeless tobacco: Never Used  . Tobacco comment: Smoking History 2.5-started at age 42  Substance and Sexual Activity  . Alcohol use: No    Alcohol/week: 0.0 standard drinks  . Drug use: No  . Sexual activity: Not on file  Lifestyle  . Physical activity:    Days per week: Not on file    Minutes per session: Not on file  . Stress: Not on file  Relationships  . Social connections:    Talks on phone: Not on file    Gets together: Not on file    Attends religious service: Not on file    Active member of club or organization: Not on file    Attends meetings of clubs or organizations:  Not on file    Relationship status: Not on file  Other Topics Concern  . Not on file  Social History Narrative  . Not on file   Family History  Problem Relation Age of Onset  . Brain cancer Mother    Scheduled Meds: . aspirin  81 mg Oral Daily  . atorvastatin  40 mg Oral Daily  . collagenase   Topical Daily  . docusate sodium  100 mg Oral Daily  . enoxaparin (LOVENOX) injection  30 mg Subcutaneous Q24H  . ferrous sulfate  325 mg Oral BID WC  . gabapentin  100 mg Oral QHS  . insulin aspart  0-5 Units Subcutaneous QHS  . insulin aspart  0-9 Units Subcutaneous TID WC  . insulin glargine  15 Units Subcutaneous QHS  . midodrine  10 mg Oral TID WC  . mometasone-formoterol  2 puff Inhalation BID  . multivitamin-lutein  1 capsule Oral Daily  . pantoprazole  40 mg Oral Daily  . polyethylene glycol  17 g Oral Daily  . povidone-iodine   Topical BID  . Ensure Max Protein  11 oz Oral BID  . tamsulosin  0.4 mg Oral Daily  . ascorbic acid  500 mg Oral BID   Continuous Infusions: . sodium chloride Stopped (09/09/18 0754)  . sodium chloride    . sodium chloride 40 mL/hr at 09/09/18 1437  . [START ON 09/10/2018] sodium chloride    . magnesium sulfate 1 - 4 g bolus IVPB    . vancomycin 750 mg (09/09/18 1438)   PRN Meds:.sodium chloride, sodium chloride, acetaminophen **OR** acetaminophen, ALPRAZolam, alum & mag hydroxide-simeth, guaiFENesin-dextromethorphan, hydrALAZINE, HYDROcodone-acetaminophen, ipratropium-albuterol, labetalol, magnesium sulfate 1 - 4 g bolus IVPB, metoprolol tartrate, naLOXone (NARCAN)  injection, ondansetron **OR** [DISCONTINUED] ondansetron (ZOFRAN) IV, phenol, polyethylene glycol, potassium chloride, traMADol Medications Prior to Admission:  Prior to Admission medications   Medication Sig Start Date End Date Taking? Authorizing Provider  ALPRAZolam Prudy Feeler) 0.5 MG tablet Take 1 tablet (0.5 mg total) by mouth 2 (two) times daily as needed for anxiety. 07/26/18  Yes Adrian Saran, MD  aspirin 81 MG chewable tablet Chew 81 mg by mouth daily. 05/30/17  Yes [provider]  atorvastatin (LIPITOR) 40 MG tablet Take 40 mg by mouth daily. 10/24/17 10/24/18 Yes [provider]  Fluticasone-Salmeterol (ADVAIR DISKUS) 250-50 MCG/DOSE AEPB Inhale 1  puff into the lungs 2 (two) times daily. 03/14/15 01/16/26 Yes Beers, Charmayne Sheer, PA-C  gabapentin (NEURONTIN) 100 MG capsule Take 1 capsule (100 mg total) by mouth at bedtime. 07/26/18  Yes Mody, Patricia Pesa, MD  insulin aspart (NOVOLOG) 100 UNIT/ML injection Inject 3 Units into the skin 3 (three) times daily with meals. 07/26/18  Yes Mody, Patricia Pesa, MD  insulin glargine (LANTUS) 100 UNIT/ML injection Inject 0.15 mLs (15 Units total) into the skin at bedtime. 07/26/18  Yes Adrian Saran, MD  metFORMIN (GLUCOPHAGE) 500 MG tablet Take 500 mg by mouth daily with breakfast.  06/13/18  Yes [provider]  pantoprazole (PROTONIX) 40 MG tablet Take 40 mg by mouth 2 (two) times daily.  04/25/18  Yes [provider]  potassium chloride (K-DUR,KLOR-CON) 10 MEQ tablet Take by mouth. 06/13/18  Yes [provider]  rivaroxaban (XARELTO) 10 MG TABS tablet Take 10 mg by mouth every evening.   Yes [provider]  SPIRIVA HANDIHALER 18 MCG inhalation capsule Place 18 mcg into inhaler and inhale daily.  05/19/18  Yes [provider]  tamsulosin (FLOMAX) 0.4 MG CAPS capsule Take 1 capsule (0.4 mg total) by mouth daily. 06/14/18  Yes Altamese Dilling, MD  vitamin C (VITAMIN C) 500 MG tablet Take 1 tablet (500 mg total) by mouth 2 (two) times daily. 06/13/18  Yes Altamese Dilling, MD  albuterol (PROVENTIL HFA;VENTOLIN HFA) 108 (90 Base) MCG/ACT inhaler Inhale 2 puffs into the lungs every 6 (six) hours as needed for wheezing or shortness of breath.    [provider]  albuterol (PROVENTIL) (2.5 MG/3ML) 0.083% nebulizer solution Inhale 2.5 mg into the lungs every 6 (six) hours as needed for  wheezing or shortness of breath.     [provider]  collagenase (SANTYL) ointment Apply topically daily. 08/25/18   Auburn Bilberry, MD  ferrous sulfate 325 (65 FE) MG tablet Take 1 tablet (325 mg total) by mouth 2 (two) times daily with a meal. 06/01/18 07/01/18  Ihor Austin, MD  furosemide (LASIX) 40 MG tablet Take 1 tablet (40 mg total) by mouth daily. 06/14/18   Altamese Dilling, MD  guaiFENesin-dextromethorphan (ROBITUSSIN DM) 100-10 MG/5ML syrup Take 5 mLs by mouth every 4 (four) hours as needed for cough. 04/11/18   Sudini, Wardell Heath, MD  ipratropium-albuterol (DUONEB) 0.5-2.5 (3) MG/3ML SOLN Take 3 mLs by nebulization 3 (three) times daily. 08/25/18   Auburn Bilberry, MD  midodrine (PROAMATINE) 10 MG tablet Take 1 tablet (10 mg total) by mouth 3 (three) times daily with meals. 07/26/18   Adrian Saran, MD  polyethylene glycol (MIRALAX / GLYCOLAX) packet Take 17 g by mouth daily. 06/14/18   Altamese Dilling, MD  protein supplement shake (PREMIER PROTEIN) LIQD Take 325 mLs (11 oz total) by mouth 2 (two) times daily between meals. 06/14/18   Altamese Dilling, MD   Allergies  Allergen Reactions  . Eggs Or Egg-Derived Products Diarrhea, Itching and Nausea And Vomiting   Review of Systems  All other systems reviewed and are negative.   Physical Exam Constitutional:      Comments: Awakens to voice and goes back to sleep  Pulmonary:     Effort: Pulmonary effort is normal.     Vital Signs: BP 121/61 (BP Location: Left Arm)   Pulse (!) 101   Temp 98.2 F (36.8 C) (Oral)   Resp 18   Ht  (1.575 m)   Wt 39 kg   SpO2 100%   BMI 15.73 kg/m  Pain Scale:  0-10 POSS *See Group Information*: S-Acceptable,Sleep, easy to arouse Pain Score: Asleep   SpO2: SpO2: 100 % O2 Device:SpO2: 100 % O2 Flow Rate: .O2 Flow Rate (L/min): 6 L/min  IO: Intake/output summary:   Intake/Output Summary (Last 24 hours) at 09/09/2018 1611 Last data filed at 09/09/2018  1000 Gross per 24 hour  Intake 1431.19 ml  Output 800 ml  Net 631.19 ml    LBM: Last BM Date: 09/03/18 Baseline Weight: Weight: 39 kg Most recent weight: Weight: 39 kg     Palliative Assessment/Data:   Flowsheet Rows     Most Recent Value  Intake Tab  Referral Department  Hospitalist  Unit at Time of Referral  Med/Surg Unit  Date Notified  09/08/18  Palliative Care Type  Return patient Palliative Care  Reason for referral  Clarify Goals of Care  Date of Admission  09/05/18  # of days IP prior to Palliative referral  3  Clinical Assessment  Psychosocial & Spiritual Assessment  Palliative Care Outcomes      Time In: 3:40 Time Out: 4:30 Time Total: 50 min Greater than 50%  of this time was spent counseling and coordinating care related to the above assessment and plan.  Signed by: Morton Stall, NP   Please contact Palliative Medicine Team phone at [phone] for questions and concerns.  For individual provider: See Loretha Stapler

## 2018-09-09 NOTE — [place] FL2 (Signed)
[place] MEDICAID FL2 LEVEL OF CARE SCREENING TOOL     IDENTIFICATION  Patient Name: April [name] Birthdate: 1955/12/06 Sex: female Admission Date (Current Location): 09/05/2018  [name] Number:  Chiropodist and Address:  [facility], [address] [place], [place] [address]      Provider Number: [id number]  Attending Physician Name and Address:  Ramonita Lab, MD  Relative Name and Phone Number:       Current Level of Care: Hospital Recommended Level of Care: Skilled Nursing Facility Prior Approval Number:    Date Approved/Denied:   PASRR Number:    Discharge Plan: SNF    Current Diagnoses: Patient Active Problem List   Diagnosis Date Noted  . Sepsis (HCC) 09/05/2018  . PAD (peripheral artery disease) (HCC) 08/25/2018  . History of left below knee amputation (HCC) 08/25/2018  . Surgical site infection 08/25/2018  . Pressure injury of skin 08/22/2018  . COPD exacerbation (HCC) 08/21/2018  . Diabetic foot infection (HCC) 07/09/2018  . Acute on chronic respiratory failure with hypoxemia (HCC) 06/03/2018  . Acute respiratory failure with hypoxia and hypercapnia (HCC)   . Acute osteomyelitis of right foot (HCC) 05/23/2018  . Protein-calorie malnutrition, severe 04/10/2018  . Diabetes (HCC) 04/08/2018  . COPD (chronic obstructive pulmonary disease) (HCC) 04/08/2018  . HLD (hyperlipidemia) 04/08/2018  . Diabetic foot ulcer (HCC) 04/08/2018  . 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 Elevated CEA 01/30/2014    Orientation RESPIRATION BLADDER Height & Weight     Self, Place, Time  Normal Incontinent Weight: 39 kg Height:  5\' 2"  (157.5 cm)  BEHAVIORAL SYMPTOMS/MOOD NEUROLOGICAL BOWEL NUTRITION STATUS  (none) (none) Incontinent (regular)  AMBULATORY STATUS COMMUNICATION OF NEEDS Skin   Total Care Verbally Wound Vac, PU Stage and Appropriate Care(groin - wound vac)                        Personal Care Assistance Level of Assistance  Bathing, Dressing Bathing Assistance: Maximum assistance   Dressing Assistance: Maximum assistance     Functional Limitations Info  (good)          SPECIAL CARE FACTORS FREQUENCY  Speech therapy, PT (By licensed PT)     PT Frequency: (daily)       Speech Therapy Frequency: (daily)      Contractures Contractures Info: Not present    Additional Factors Info  Isolation Precautions(MRSA) Code Status Info: (full)       Isolation Precautions Info: (MRSA)     Current Medications (09/09/2018):  This is the current hospital active medication list Current Facility-Administered Medications  Medication Dose Route Frequency Provider Last Rate Last Dose  . 0.9 %  sodium chloride infusion   Intravenous PRN Bertrum Sol, MD   Stopped at 09/09/18 0754  . 0.9 %  sodium chloride infusion  500 mL Intravenous Once PRN Louisa Second, MD      . 0.9 %  sodium chloride infusion   Intravenous Continuous Alford Highland, MD 40 mL/hr at 09/09/18 1000    . [START ON 09/10/2018] 0.9 %  sodium chloride infusion   Intravenous Continuous Stegmayer, Kimberly A, PA-C      . acetaminophen (TYLENOL) tablet 325-650 mg  325-650 mg Oral Q4H PRN Louisa Second, MD   650 mg at 09/08/18 1337   Or  . acetaminophen (TYLENOL) suppository 325-650 mg  325-650 mg Rectal Q4H PRN Louisa Second, MD      .  ALPRAZolam Prudy Feeler) tablet 0.5 mg  0.5 mg Oral BID PRN Angelina Ok D, MD   0.5 mg at 09/06/18 0239  . alum & mag hydroxide-simeth (MAALOX/MYLANTA) 200-200-20 MG/5ML suspension 15-30 mL  15-30 mL Oral Q2H PRN Louisa Second, MD      . aspirin chewable tablet 81 mg  81 mg Oral Daily Salary, Montell D, MD   81 mg at 09/08/18 0814  . atorvastatin (LIPITOR) tablet 40 mg  40 mg Oral Daily Salary, Montell D, MD   40 mg at 09/08/18 2140  . collagenase (SANTYL) ointment   Topical Daily Salary, Montell D, MD      . docusate sodium (COLACE)  capsule 100 mg  100 mg Oral Daily Louisa Second, MD   100 mg at 09/08/18 0816  . enoxaparin (LOVENOX) injection 30 mg  30 mg Subcutaneous Q24H Lowella Bandy, [facility]      . ferrous sulfate tablet 325 mg  325 mg Oral BID WC Salary, Montell D, MD   325 mg at 09/08/18 1836  . gabapentin (NEURONTIN) capsule 100 mg  100 mg Oral QHS Salary, Montell D, MD   100 mg at 09/08/18 2140  . guaiFENesin-dextromethorphan (ROBITUSSIN DM) 100-10 MG/5ML syrup 15 mL  15 mL Oral Q4H PRN Louisa Second, MD      . hydrALAZINE (APRESOLINE) injection 5 mg  5 mg Intravenous Q20 Min PRN Louisa Second, MD   5 mg at 09/07/18 1901  . HYDROcodone-acetaminophen (NORCO/VICODIN) 5-325 MG per tablet 1 tablet  1 tablet Oral Q6H PRN Alford Highland, MD   1 tablet at 09/09/18 [phone]  . insulin aspart (novoLOG) injection 0-5 Units  0-5 Units Subcutaneous QHS Wieting, Richard, MD      . insulin aspart (novoLOG) injection 0-9 Units  0-9 Units Subcutaneous TID WC Alford Highland, MD   3 Units at 09/08/18 1338  . insulin glargine (LANTUS) injection 15 Units  15 Units Subcutaneous QHS Angelina Ok D, MD   15 Units at 09/07/18 2218  . ipratropium-albuterol (DUONEB) 0.5-2.5 (3) MG/3ML nebulizer solution 3 mL  3 mL Nebulization Q4H PRN Wieting, Richard, MD      . labetalol (NORMODYNE,TRANDATE) injection 10 mg  10 mg Intravenous Q10 min PRN Louisa Second, MD      . magnesium sulfate IVPB 2 g 50 mL  2 g Intravenous Daily PRN Louisa Second, MD      . metoprolol tartrate (LOPRESSOR) injection 2-5 mg  2-5 mg Intravenous Q2H PRN Louisa Second, MD      . midodrine (PROAMATINE) tablet 10 mg  10 mg Oral TID WC Salary, Montell D, MD   10 mg at 09/09/18 0741  . mometasone-formoterol (DULERA) 200-5 MCG/ACT inhaler 2 puff  2 puff Inhalation BID Angelina Ok D, MD   2 puff at 09/09/18 0741  . multivitamin-lutein (OCUVITE-LUTEIN) capsule 1 capsule  1 capsule Oral Daily Wieting, Richard, MD   1 capsule at 09/08/18 0837  . naloxone [facility])  injection 0.4 mg  0.4 mg Intravenous PRN Alford Highland, MD      . ondansetron (ZOFRAN) tablet 4 mg  4 mg Oral Q6H PRN Salary, Montell D, MD      . pantoprazole (PROTONIX) EC tablet 40 mg  40 mg Oral Daily Louisa Second, MD   40 mg at 09/08/18 0815  . phenol (CHLORASEPTIC) mouth spray 1 spray  1 spray Mouth/Throat PRN Louisa Second, MD      . polyethylene glycol (MIRALAX / GLYCOLAX) packet 17 g  17 g Oral Daily  Bertrum Sol, MD   17 g at 09/08/18 0768  . polyethylene glycol (MIRALAX / GLYCOLAX) packet 17 g  17 g Oral Daily PRN Salary, Montell D, MD      . potassium chloride SA (K-DUR,KLOR-CON) CR tablet 20-40 mEq  20-40 mEq Oral Daily PRN Louisa Second, MD      . povidone-iodine (BETADINE) 10 % external solution   Topical BID Louisa Second, MD      . protein supplement (ENSURE MAX) liquid  11 oz Oral BID Alford Highland, MD   11 oz at 09/08/18 1337  . tamsulosin (FLOMAX) capsule 0.4 mg  0.4 mg Oral Daily Salary, Montell D, MD   0.4 mg at 09/08/18 1338  . traMADol (ULTRAM) tablet 50 mg  50 mg Oral Q6H PRN Salary, Montell D, MD   50 mg at 09/09/18 0840  . vancomycin (VANCOCIN) IVPB 750 mg/150 ml premix  750 mg Intravenous Q24H Lowella Bandy, [facility]   Stopped at 09/08/18 1529  . vitamin C (ASCORBIC ACID) tablet 500 mg  500 mg Oral BID Angelina Ok D, MD   500 mg at 09/08/18 2140     Discharge Medications: Please see discharge summary for a list of discharge medications.  Relevant Imaging Results:  Relevant Lab Results:   Additional Information ([id number])  Gwenette Greet, RN

## 2018-09-09 NOTE — Plan of Care (Signed)
  Problem: Pain Managment: Goal: General experience of comfort will improve Outcome: Progressing   Problem: Safety: Goal: Ability to remain free from injury will improve Outcome: Progressing   Problem: Skin Integrity: Goal: Risk for impaired skin integrity will decrease Outcome: Progressing  Patient refuses to turn. Staff has attempt to turn q2 hours but patient will roll back to her right side every time. Dressing changes performed. Patient educated on risks of further skin breakdown

## 2018-09-09 NOTE — Progress Notes (Signed)
PT Cancellation Note  Patient Details Name: April [name] MRN: [id number] DOB: Feb 19, 1956   Cancelled Treatment:    Reason Eval/Treat Not Completed: Patient declined, no reason specified.  Upon PT entering room, pt immediately stating "not today".  Pt stating she did not sleep well last night and currently had 8/10 pain.  Discussed pt with pt's nurse who reported giving pt pain meds recently.  Will re-attempt PT evaluation at a later date/time.  Hendricks Limes, PT 09/09/18, 3:11 PM [phone]

## 2018-09-09 NOTE — Plan of Care (Signed)
PMT note:  Family meeting tomorrow around 1:00. Patient would like her daughter to be HPOA. Chaplain consulted to complete these papers.

## 2018-09-09 NOTE — Progress Notes (Signed)
[facility]  Daily Progress Note   Subjective: 2 Days Post-Op: Left groin wound exploration,sharpdebridement, removal of infectedfemoral enarterectomypatch angioplasty, commonfemoral endarterectomy,autologouspatch angioplasty with right below the knee greater saphenous vein, wound washout with Pulsavac irrigator withvancomycin solution, and application of wound VAC with silver sponge  Patient sleeping comfortably upon arrival to room. Once awoken, patient started to complain about pain to the sacral area.   Objective: Vitals:   09/08/18 1251 09/08/18 2025 09/09/18 0503 09/09/18 0600  BP: (!) 101/58 (!) 99/53 (!) 101/55 (!) 101/55  Pulse: 98 92 (!) 101 (!) 103  Resp: (!) 26 18 18 18   Temp: 98.6 F (37 C) 98.3 F (36.8 C)  98 F (36.7 C)  TempSrc: Oral Oral  Oral  SpO2: 98% 99% 99% 99%  Weight:      Height:        Intake/Output Summary (Last 24 hours) at 09/09/2018 1001 Last data filed at 09/09/2018 0749 Gross per 24 hour  Intake 1342.86 ml  Output 800 ml  Net 542.86 ml   Physical Exam: A&Ox3, NAD CV: RRR Pulmonary: CTA Bilaterally Abdomen: Soft, Nontender, Nondistended, (+) Bowel Sounds Left Groin: VAC in place and to seal.  Vascular: Left Lower Extremity: Thigh soft, calf soft. Stump healthy - no breakdown noted. Right Lower Extremity: Thigh soft, calf soft. Warm distally to toes. Hard to palpate pedal pulses but toes are warm. Toe amputation site intact, no break down of skin. Wound at bottom of foot shows signs of continued healing.    Laboratory: CBC    Component Value Date/Time   WBC 13.8 (H) 09/08/2018 0218   HGB 9.2 (L) 09/08/2018 0218   HGB 13.6 10/14/2014 0644   HCT 29.8 (L) 09/08/2018 0218   HCT 41.3 10/14/2014 0644   PLT 190 09/08/2018 0218   PLT 156 10/14/2014 0644   BMET    Component Value Date/Time   NA 134 (L) 09/08/2018 0218   NA 142 10/14/2014 0644   K 4.4 09/08/2018 0218   K 3.6 10/14/2014 0644   CL 104  09/08/2018 0218   CL 109 10/14/2014 0644   CO2 24 09/08/2018 0218   CO2 29 10/14/2014 0644   GLUCOSE 226 (H) 09/08/2018 0218   GLUCOSE 142 (H) 10/14/2014 0644   BUN 17 09/08/2018 0218   BUN 9 10/14/2014 0644   CREATININE 0.60 09/08/2018 0218   CREATININE 0.53 10/14/2014 0644   CALCIUM 7.5 (L) 09/08/2018 0218   CALCIUM 7.7 (L) 10/14/2014 0644   GFRNONAA >60 09/08/2018 0218   GFRNONAA >60 10/14/2014 0644   GFRAA >60 09/08/2018 0218   GFRAA >60 10/14/2014 0644   Assessment/Planning: The patient is a 63 year old female admitted for sepsis / left groin wound infection s/p debridement and reconstruction - POD #2 1) VAC intact and to suction - due to complexity of wound and patients pain will plan on first VAC change in OR tomorrow with Dr. Wyn Quaker. Will pre-op 2) Extremities are warm, amputation sites are intact without breakdown of tissue 3) Sacral wound as per general surgery 4) Patient will needed continued VAC changes every other day upon discharge 5) Agree with SNF placement for continue care upon discharge  Discussed with Dr. Wallis Mart [facility] PA-C 09/09/2018 10:01 AM

## 2018-09-10 ENCOUNTER — Encounter (INDEPENDENT_AMBULATORY_CARE_PROVIDER_SITE_OTHER): Payer: Self-pay | Admitting: Nurse Practitioner

## 2018-09-10 ENCOUNTER — Inpatient Hospital Stay: Payer: Medicare Other | Admitting: Anesthesiology

## 2018-09-10 ENCOUNTER — Encounter: Payer: Self-pay | Admitting: Anesthesiology

## 2018-09-10 ENCOUNTER — Encounter
Admission: EM | Disposition: A | Discharge status: Home Health | Payer: Self-pay | Source: Home / Self Care | Attending: Internal Medicine

## 2018-09-10 DIAGNOSIS — Z95820 Peripheral vascular angioplasty status with implants and grafts: Secondary | ICD-10-CM

## 2018-09-10 DIAGNOSIS — T827XXA Infection and inflammatory reaction due to other cardiac and vascular devices, implants and grafts, initial encounter: Secondary | ICD-10-CM

## 2018-09-10 DIAGNOSIS — Z89512 Acquired absence of left leg below knee: Secondary | ICD-10-CM

## 2018-09-10 DIAGNOSIS — Z1621 Resistance to vancomycin: Secondary | ICD-10-CM

## 2018-09-10 DIAGNOSIS — Z91012 Allergy to eggs: Secondary | ICD-10-CM

## 2018-09-10 DIAGNOSIS — T8149XA Infection following a procedure, other surgical site, initial encounter: Secondary | ICD-10-CM

## 2018-09-10 DIAGNOSIS — E11628 Type 2 diabetes mellitus with other skin complications: Secondary | ICD-10-CM

## 2018-09-10 DIAGNOSIS — F17210 Nicotine dependence, cigarettes, uncomplicated: Secondary | ICD-10-CM

## 2018-09-10 DIAGNOSIS — Z86718 Personal history of other venous thrombosis and embolism: Secondary | ICD-10-CM

## 2018-09-10 DIAGNOSIS — B952 Enterococcus as the cause of diseases classified elsewhere: Secondary | ICD-10-CM

## 2018-09-10 DIAGNOSIS — Z7901 Long term (current) use of anticoagulants: Secondary | ICD-10-CM

## 2018-09-10 DIAGNOSIS — B9562 Methicillin resistant Staphylococcus aureus infection as the cause of diseases classified elsewhere: Secondary | ICD-10-CM

## 2018-09-10 DIAGNOSIS — L089 Local infection of the skin and subcutaneous tissue, unspecified: Secondary | ICD-10-CM

## 2018-09-10 DIAGNOSIS — Z8631 Personal history of diabetic foot ulcer: Secondary | ICD-10-CM

## 2018-09-10 DIAGNOSIS — Z978 Presence of other specified devices: Secondary | ICD-10-CM

## 2018-09-10 DIAGNOSIS — L89320 Pressure ulcer of left buttock, unstageable: Secondary | ICD-10-CM

## 2018-09-10 DIAGNOSIS — E1151 Type 2 diabetes mellitus with diabetic peripheral angiopathy without gangrene: Secondary | ICD-10-CM

## 2018-09-10 HISTORY — PX: APPLICATION OF WOUND VAC: SHX5189

## 2018-09-10 LAB — BASIC METABOLIC PANEL
Anion gap: 5 (ref 5–15)
BUN: 14 mg/dL (ref 8–23)
CO2: 23 mmol/L (ref 22–32)
CREATININE: 0.56 mg/dL (ref 0.44–1.00)
Calcium: 7.5 mg/dL — ABNORMAL LOW (ref 8.9–10.3)
Chloride: 107 mmol/L (ref 98–111)
GFR calc Af Amer: >60 mL/min (ref >60)
GLUCOSE: 99 mg/dL (ref 70–99)
Potassium: 4.3 mmol/L (ref 3.5–5.1)
Sodium: 135 mmol/L (ref 135–145)

## 2018-09-10 LAB — CULTURE, BLOOD (ROUTINE X 2)
Culture: NO GROWTH
Special Requests: ADEQUATE

## 2018-09-10 LAB — CBC
HCT: 28.5 % — ABNORMAL LOW (ref 36.0–46.0)
Hemoglobin: 8.8 g/dL — ABNORMAL LOW (ref 12.0–15.0)
MCH: 30.4 pg (ref 26.0–34.0)
MCHC: 30.9 g/dL (ref 30.0–36.0)
MCV: 98.6 fL (ref 80.0–100.0)
Platelets: 218 10*3/uL (ref 150–400)
RBC: 2.89 MIL/uL — ABNORMAL LOW (ref 3.87–5.11)
RDW: 16.2 % — ABNORMAL HIGH (ref 11.5–15.5)
WBC: 10.6 10*3/uL — ABNORMAL HIGH (ref 4.0–10.5)
nRBC: 0 % (ref 0.0–0.2)

## 2018-09-10 LAB — GLUCOSE, CAPILLARY
GLUCOSE-CAPILLARY: 72 mg/dL (ref 70–99)
GLUCOSE-CAPILLARY: 141 mg/dL — AB (ref 70–99)
GLUCOSE-CAPILLARY: 93 mg/dL (ref 70–99)
Glucose-Capillary: 86 mg/dL (ref 70–99)
Glucose-Capillary: 62 mg/dL — ABNORMAL LOW (ref 70–99)
Glucose-Capillary: 126 mg/dL — ABNORMAL HIGH (ref 70–99)
Glucose-Capillary: 98 mg/dL (ref 70–99)
Glucose-Capillary: 73 mg/dL (ref 70–99)
Glucose-Capillary: 77 mg/dL (ref 70–99)

## 2018-09-10 LAB — TYPE AND SCREEN
ABO/RH(D): B POS
Antibody Screen: NEGATIVE

## 2018-09-10 LAB — MAGNESIUM: MAGNESIUM: 1.8 mg/dL (ref 1.7–2.4)

## 2018-09-10 LAB — PROTIME-INR
INR: 1.2 (ref 0.8–1.2)
Prothrombin Time: 14.8 seconds (ref 11.4–15.2)

## 2018-09-10 LAB — APTT: aPTT: 39 seconds — ABNORMAL HIGH (ref 24–36)

## 2018-09-10 SURGERY — APPLICATION, WOUND VAC
Anesthesia: General | Laterality: Left

## 2018-09-10 MED ORDER — LINEZOLID 600 MG PO TABS
600.0000 mg | ORAL_TABLET | Freq: Two times a day (BID) | ORAL | Status: DC
  Administered 2018-09-10 – 2018-09-13 (×7): 600 mg via ORAL
  Filled 2018-09-10 (×9): qty 1

## 2018-09-10 MED ORDER — DEXTROSE 50 % IV SOLN
INTRAVENOUS | Status: AC
  Administered 2018-09-10: 25 mL via INTRAVENOUS
  Filled 2018-09-10: qty 50

## 2018-09-10 MED ORDER — FENTANYL CITRATE (PF) 100 MCG/2ML IJ SOLN
INTRAMUSCULAR | Status: AC
  Filled 2018-09-10: qty 2

## 2018-09-10 MED ORDER — METOPROLOL TARTRATE 25 MG PO TABS
12.5000 mg | ORAL_TABLET | Freq: Once | ORAL | Status: DC
  Filled 2018-09-10: qty 1

## 2018-09-10 MED ORDER — DEXTROSE 50 % IV SOLN
25.0000 mL | Freq: Once | INTRAVENOUS | Status: AC
  Administered 2018-09-10: 25 mL via INTRAVENOUS

## 2018-09-10 MED ORDER — DEXTROSE 50 % IV SOLN
INTRAVENOUS | Status: AC
  Filled 2018-09-10: qty 50

## 2018-09-10 MED ORDER — PROPOFOL 10 MG/ML IV BOLUS
INTRAVENOUS | Status: AC
  Filled 2018-09-10: qty 20

## 2018-09-10 MED ORDER — METOPROLOL TARTRATE 25 MG PO TABS
12.5000 mg | ORAL_TABLET | Freq: Once | ORAL | Status: AC
  Administered 2018-09-10: 12.5 mg via ORAL
  Filled 2018-09-10: qty 1

## 2018-09-10 MED ORDER — FLEET ENEMA 7-19 GM/118ML RE ENEM: 1.0000 | ENEMA | Freq: Once | RECTAL | Status: DC

## 2018-09-10 MED ORDER — ONDANSETRON HCL 4 MG/2ML IJ SOLN: 4.0000 mg | Freq: Once | INTRAMUSCULAR | Status: DC | PRN

## 2018-09-10 MED ORDER — FENTANYL CITRATE (PF) 100 MCG/2ML IJ SOLN: 25.0000 ug | INTRAMUSCULAR | Status: DC | PRN

## 2018-09-10 MED ORDER — PROPOFOL 10 MG/ML IV BOLUS
INTRAVENOUS | Status: DC | PRN
  Administered 2018-09-10: 30 mg via INTRAVENOUS

## 2018-09-10 MED ORDER — MIDAZOLAM HCL 2 MG/2ML IJ SOLN
INTRAMUSCULAR | Status: DC | PRN
  Administered 2018-09-10: 2 mg via INTRAVENOUS

## 2018-09-10 MED ORDER — MIDAZOLAM HCL 2 MG/2ML IJ SOLN
INTRAMUSCULAR | Status: AC
  Filled 2018-09-10: qty 2

## 2018-09-10 MED ORDER — MAGNESIUM SULFATE 2 GM/50ML IV SOLN
2.0000 g | Freq: Once | INTRAVENOUS | Status: AC
  Administered 2018-09-10: 2 g via INTRAVENOUS
  Filled 2018-09-10: qty 50

## 2018-09-10 MED ORDER — PROPOFOL 500 MG/50ML IV EMUL
INTRAVENOUS | Status: AC
  Filled 2018-09-10: qty 50

## 2018-09-10 MED ORDER — PROPOFOL 500 MG/50ML IV EMUL
INTRAVENOUS | Status: DC | PRN
  Administered 2018-09-10: 100 ug/kg/min via INTRAVENOUS

## 2018-09-10 MED ORDER — FENTANYL CITRATE (PF) 100 MCG/2ML IJ SOLN
INTRAMUSCULAR | Status: DC | PRN
  Administered 2018-09-10 (×2): 25 ug via INTRAVENOUS

## 2018-09-10 SURGICAL SUPPLY — 27 items
BLADE SURG 15 STRL LF DISP TIS (BLADE) IMPLANT
BLADE SURG 15 STRL SS (BLADE) ×2
CANISTER SUCT 1200ML W/VALVE (MISCELLANEOUS) ×3 IMPLANT
CANISTER WOUND CARE 500ML ATS (WOUND CARE) ×2 IMPLANT
COVER WAND RF STERILE (DRAPES) ×1 IMPLANT
DRAPE INCISE IOBAN 66X45 STRL (DRAPES) ×3 IMPLANT
DRAPE LAPAROTOMY 100X77 ABD (DRAPES) ×3 IMPLANT
DRSG VAC ATS MED SENSATRAC (GAUZE/BANDAGES/DRESSINGS) ×2 IMPLANT
ELECT REM PT RETURN 9FT ADLT (ELECTROSURGICAL) ×3
ELECTRODE REM PT RTRN 9FT ADLT (ELECTROSURGICAL) ×1 IMPLANT
GLOVE BIO SURGEON STRL SZ7 (GLOVE) ×7 IMPLANT
GOWN STRL REUS W/ TWL LRG LVL3 (GOWN DISPOSABLE) ×1 IMPLANT
GOWN STRL REUS W/ TWL XL LVL3 (GOWN DISPOSABLE) ×1 IMPLANT
GOWN STRL REUS W/TWL LRG LVL3 (GOWN DISPOSABLE) ×2
GOWN STRL REUS W/TWL XL LVL3 (GOWN DISPOSABLE) ×2
IV NS 1000ML (IV SOLUTION) ×2
IV NS 1000ML BAXH (IV SOLUTION) IMPLANT
KIT TURNOVER KIT A (KITS) ×3 IMPLANT
NS IRRIG 1000ML POUR BTL (IV SOLUTION) ×3 IMPLANT
PACK BASIN MINOR ARMC (MISCELLANEOUS) ×3 IMPLANT
PULSAVAC PLUS IRRIG FAN TIP (DISPOSABLE) ×3
SOL PREP PVP 2OZ (MISCELLANEOUS) ×3
SOLUTION PREP PVP 2OZ (MISCELLANEOUS) ×1 IMPLANT
SPONGE LAP 18X18 RF (DISPOSABLE) ×1 IMPLANT
SUT VIC AB 3-0 SH 27 (SUTURE) ×2
SUT VIC AB 3-0 SH 27X BRD (SUTURE) IMPLANT
TIP FAN IRRIG PULSAVAC PLUS (DISPOSABLE) IMPLANT

## 2018-09-10 NOTE — Consult Note (Signed)
NAME: April [name]  DOB: 1956-06-30  MRN: [id number]  Date/Time: 09/10/2018 6:43 PM  REQUESTING PROVIDER:Gouru Subjective:  REASON FOR CONSULT: left groin infection with VRE/MRSA ? JANARI GAGNER is a  63 y.o. female with a history of diabetes mellitus, bilateral foot infection, history of recurrent DVT on Xarelto, smoker, b/l foot infections Admitted with infected groin wounds-   pt has a complicated history with severe peripheral artery disease , foot wounds, DM, smoking She has had chronic foot wounds which were being managed by podiatrist- she has had MRSA infections in the wounds In May 27, 2018 she underwent   percutaneous transluminal angioplasty and stent placement in the right common iliac artery , left common iliac artery and angioplasty to the right superficial femoral artery, stent placement of the left external iliac artery and  incision and drainage of the deep abscess and removal of the sesamoid bone of the first metatarsal head on the right foot by Dr.Cline   The wound culture had MRSA so was the urine culture.treated with VAnco and then doxy.  Admitted 07/10/18-07/26/18 for b/l foot infection 07/11/18 excisional debridement up to the muscle on the right foot ulceration and also excision of the distal first metatarsal right foot  07/17/18: significant plaque in bilateral common femoral, profunda femoris arteries and the left proximal SFA.  Stent from the right SFA was protruding about a centimeter into the common femoral artery  Left common femoral, profunda femoris, and superficial femoral artery endarterectomies, patch angioplasty 2.   Right common femoral and profunda femoris endarterectomies 3.   Partial resection of right SFA stent  1/23 Left BKA MRSA infection treated with vanco IV until 08/08/18 08/14/18 seen as OP- both groin surgical site looked infected- put on keflex by NP 2/20-2/24/20 admitted with  COPD exacerbation  Brought to the hospital on 3/6 with  dehiscence and purulent discharge from the surgical site of her femoral endarterectomy and also complaining of progressively worsening pain, redness, and swelling that originates in the site of a decubitus ulcer in the left buttock.She was in her vascular surgeon's office 2 days ago and was restarted on oral antibiotics for questionable abscess with overlying cellulitis versus superinfected decubitus ulcer. No fever. CT pelvis done on 09/05/18 showed LEFT proximal femoral artery there is an irregular heterogeneous gas and fluid collection with rim enhancement which measures 1.7 x 7.8 x 2.5 centimeters. Small amount of air is identified within the wound at the skin, consistent with the history of draining wound. There is no evidence for pseudoaneurysm or arteriovenous fistula. The LEFT gluteus maximus is enlarged and heterogeneous, measuring 5.4 centimeters, compared to 1.8 centimeters on the RIGHT. Vessels traverse the muscle and considerations include inflammation/infection, or resolving hematoma. Was started on vanco and cefepime. Culture from 3/6 showed coag neg staph from one bottle thought to be a contaminant.  On 09/07/18 she underwent Left groin wound exploration, sharp debridement, removal of infected femoral endarterectomy patch angioplasty, common femoral endarterectomy, autologous patch angioplasty with right below the knee greater saphenous vein, wound washout with Pulsavac irrigator with vancomycin solution, and application of wound VAC with silver sponge.  Culture from left groin during surgery showed MRSA, VRE, yeast- left femoral graft culture showed MRSA and VRE. She was started on linezolid and I am asked to see the patient for the same.  She underwent further irrigation and debridement on 3/11  Pt does not give any history as she is tired and sleeping Grand daughter at bed side  Past Medical History:  Diagnosis Date  . Anxiety   . Colon polyps   . COPD (chronic obstructive  pulmonary disease) (HCC)   . Diabetes mellitus without complication (HCC)   . DVT (deep venous thrombosis) (HCC)   . DVT (deep venous thrombosis) (HCC)   . History of colonic polyps   . Hyperlipidemia   . Peripheral artery disease (HCC)   . Pulmonary nodules/lesions, multiple 08/2012   NEGATIVE PET SCAN  . Retroperitoneal abscess (HCC)    history of in 04/2013  . Tobacco abuse     Past Surgical History:  Procedure Laterality Date  . AMPUTATION Left 07/24/2018   Procedure: AMPUTATION BELOW KNEE;  Surgeon: Annice Needy, MD;  Location: [facility];  Service: Vascular;  Laterality: Left;  . APPLICATION OF WOUND VAC Right 07/11/2018   Procedure: APPLICATION OF WOUND VAC;  Surgeon: Gwyneth Revels, DPM;  Location: [facility];  Service: Podiatry;  Laterality: Right;  . APPLICATION OF WOUND VAC Left 09/07/2018   Procedure: APPLICATION OF WOUND VAC;  Surgeon: Louisa Second, MD;  Location: [facility];  Service: Vascular;  Laterality: Left;  . CHOLECYSTECTOMY    . COLONOSCOPY  05/2007  . ENDARTERECTOMY FEMORAL Bilateral 07/17/2018   Procedure: ENDARTERECTOMY FEMORAL - BILATERAL SFA STENT PLACEMENT - LEFT;  Surgeon: Annice Needy, MD;  Location: [facility];  Service: Vascular;  Laterality: Bilateral;  . FEMORAL ARTERY EXPLORATION  09/07/2018   Procedure: FEMORAL ARTERY EXPLORATION AND REVISION SAPHENOUS VEIN HARVEST FROM RIGHT LEG;  Surgeon: Louisa Second, MD;  Location: [facility];  Service: Vascular;;  . IRRIGATION AND DEBRIDEMENT FOOT Bilateral 05/25/2018   Procedure: IRRIGATION AND DEBRIDEMENT FOOT application wound vac left  foot;  Surgeon: Recardo Evangelist, DPM;  Location: [facility];  Service: Podiatry;  Laterality: Bilateral;  . IRRIGATION AND DEBRIDEMENT FOOT Right 07/11/2018   Procedure: FOOT DEBRIDEMENT;  Surgeon: Gwyneth Revels, DPM;  Location: [facility];  Service: Podiatry;  Laterality: Right;  . LOWER EXTREMITY ANGIOGRAPHY Left 05/26/2018   Procedure: Lower Extremity Angiography with possible  intervention;  Surgeon: Annice Needy, MD;  Location: [facility] INVASIVE [facility] LAB;  Service: Cardiovascular;  Laterality: Left;  . LOWER EXTREMITY ANGIOGRAPHY Right 05/27/2018   Procedure: Lower Extremity Angiography;  Surgeon: Renford Dills, MD;  Location: [facility] INVASIVE CV LAB;  Service: Cardiovascular;  Laterality: Right;  . LOWER EXTREMITY ANGIOGRAPHY Right 07/15/2018   Procedure: Lower Extremity Angiography;  Surgeon: Renford Dills, MD;  Location: [facility] INVAS[facility] CV LAB;  Service: Cardiovascular;  Laterality: Right;  . WOUND EXPLORATION Left 09/07/2018   Procedure: WOUND EXPLORATION;  Surgeon: Louisa Second, MD;  Location: [facility];  Service: Vascular;  Laterality: Left;    Social History   Socioeconomic History  . Marital status: Divorced    Spouse name: Not on file  . Number of children: Not on file  . Years of education: Not on file  . Highest education level: Not on file  Occupational History  . Not on file  Social Needs  . Financial resource strain: Not on file  . Food insecurity:    Worry: Not on file    Inability: Not on file  . Transportation needs:    Medical: Not on file    Non-medical: Not on file  Tobacco Use  . Smoking status: Current Every Day Smoker    Packs/day: 1.50    Years: 47.00    Pack years: 70.50    Types: Cigarettes  . Smokeless tobacco: Never Used  . Tobacco comment: Smoking  History 2.5-started at age 56  Substance and Sexual Activity  . Alcohol use: No    Alcohol/week: 0.0 standard drinks  . Drug use: No  . Sexual activity: Not on file  Lifestyle  . Physical activity:    Days per week: Not on file    Minutes per session: Not on file  . Stress: Not on file  Relationships  . Social connections:    Talks on phone: Not on file    Gets together: Not on file    Attends religious service: Not on file    Active member of club or organization: Not on file    Attends meetings of clubs or organizations: Not on file    Relationship status: Not on  file  . Intimate partner violence:    Fear of current or ex partner: Not on file    Emotionally abused: Not on file    Physically abused: Not on file    Forced sexual activity: Not on file  Other Topics Concern  . Not on file  Social History Narrative  . Not on file    Family History  Problem Relation Age of Onset  . Brain cancer Mother    Allergies  Allergen Reactions  . Eggs Or Egg-Derived Products Diarrhea, Itching and Nausea And Vomiting   ? Current Facility-Administered Medications  Medication Dose Route Frequency Provider Last Rate Last Dose  . 0.9 %  sodium chloride infusion   Intravenous PRN Annice Needy, MD   Stopped at 09/09/18 [phone]  . 0.9 %  sodium chloride infusion  500 mL Intravenous Once PRN Annice Needy, MD      . 0.9 %  sodium chloride infusion   Intravenous Continuous Annice Needy, MD 40 mL/hr at 09/09/18 1836    . 0.9 %  sodium chloride infusion   Intravenous Continuous Annice Needy, MD 75 mL/hr at 09/10/18 1505    . acetaminophen (TYLENOL) tablet 325-650 mg  325-650 mg Oral Q4H PRN Annice Needy, MD   650 mg at 09/08/18 1337   Or  . acetaminophen (TYLENOL) suppository 325-650 mg  325-650 mg Rectal Q4H PRN Annice Needy, MD      . ALPRAZolam Prudy Feeler) tablet 0.5 mg  0.5 mg Oral BID PRN Annice Needy, MD   0.5 mg at 09/10/18 0017  . alum & mag hydroxide-simeth (MAALOX/MYLANTA) 200-200-20 MG/5ML suspension 15-30 mL  15-30 mL Oral Q2H PRN Annice Needy, MD      . aspirin chewable tablet 81 mg  81 mg Oral Daily Annice Needy, MD   81 mg at 09/09/18 1208  . atorvastatin (LIPITOR) tablet 40 mg  40 mg Oral Daily Annice Needy, MD   40 mg at 09/09/18 2346  . collagenase (SANTYL) ointment   Topical Daily Dew, Marlow Baars, MD      . dextrose 50 % solution           . docusate sodium (COLACE) capsule 100 mg  100 mg Oral Daily Annice Needy, MD   100 mg at 09/09/18 1204  . enoxaparin (LOVENOX) injection 30 mg  30 mg Subcutaneous Q24H Annice Needy, MD   30 mg at 09/09/18 1100  . ferrous  sulfate tablet 325 mg  325 mg Oral BID WC Annice Needy, MD   325 mg at 09/09/18 1655  . gabapentin (NEURONTIN) capsule 100 mg  100 mg Oral QHS Annice Needy, MD   100 mg at 09/09/18  2346  . guaiFENesin-dextromethorphan (ROBITUSSIN DM) 100-10 MG/5ML syrup 15 mL  15 mL Oral Q4H PRN Annice Needy, MD      . hydrALAZINE (APRESOLINE) injection 5 mg  5 mg Intravenous Q20 Min PRN Annice Needy, MD   5 mg at 09/07/18 1901  . HYDROcodone-acetaminophen (NORCO) 7.5-325 MG per tablet 1 tablet  1 tablet Oral Q6H PRN Annice Needy, MD   1 tablet at 09/10/18 1836  . insulin aspart (novoLOG) injection 0-5 Units  0-5 Units Subcutaneous QHS Annice Needy, MD      . insulin aspart (novoLOG) injection 0-9 Units  0-9 Units Subcutaneous TID WC Annice Needy, MD   5 Units at 09/09/18 1740  . insulin glargine (LANTUS) injection 15 Units  15 Units Subcutaneous QHS Annice Needy, MD   15 Units at 09/09/18 2353  . ipratropium-albuterol (DUONEB) 0.5-2.5 (3) MG/3ML nebulizer solution 3 mL  3 mL Nebulization Q4H PRN Annice Needy, MD      . ketorolac (TORADOL) 30 MG/ML injection 30 mg  30 mg Intravenous Q12H PRN Annice Needy, MD   30 mg at 09/10/18 1028  . labetalol (NORMODYNE,TRANDATE) injection 10 mg  10 mg Intravenous Q10 min PRN Annice Needy, MD      . linezolid (ZYVOX) tablet 600 mg  600 mg Oral Q12H Lowella Bandy, RPH   600 mg at 09/10/18 1827  . magnesium sulfate IVPB 2 g 50 mL  2 g Intravenous Daily PRN Annice Needy, MD      . midodrine (PROAMATINE) tablet 10 mg  10 mg Oral TID WC Annice Needy, MD   10 mg at 09/10/18 1827  . mometasone-formoterol (DULERA) 200-5 MCG/ACT inhaler 2 puff  2 puff Inhalation BID Annice Needy, MD   2 puff at 09/09/18 2352  . multivitamin-lutein (OCUVITE-LUTEIN) capsule 1 capsule  1 capsule Oral Daily Annice Needy, MD   1 capsule at 09/09/18 1208  . naloxone [facility]) injection 0.4 mg  0.4 mg Intravenous PRN Annice Needy, MD      . ondansetron [facility]) tablet 4 mg  4 mg Oral Q6H PRN Annice Needy, MD       . pantoprazole (PROTONIX) EC tablet 40 mg  40 mg Oral Daily Annice Needy, MD   40 mg at 09/09/18 1204  . phenol (CHLORASEPTIC) mouth spray 1 spray  1 spray Mouth/Throat PRN Annice Needy, MD      . polyethylene glycol (MIRALAX / GLYCOLAX) packet 17 g  17 g Oral Daily Annice Needy, MD   17 g at 09/09/18 1207  . polyethylene glycol (MIRALAX / GLYCOLAX) packet 17 g  17 g Oral Daily PRN Annice Needy, MD      . potassium chloride SA (K-DUR,KLOR-CON) CR tablet 20-40 mEq  20-40 mEq Oral Daily PRN Annice Needy, MD      . povidone-iodine (BETADINE) 10 % external solution   Topical BID Annice Needy, MD      . protein supplement (ENSURE MAX) liquid  11 oz Oral BID Annice Needy, MD   11 oz at 09/09/18 2350  . sodium phosphate (FLEET) 7-19 GM/118ML enema 1 enema  1 enema Rectal Once Gouru, Aruna, MD      . tamsulosin (FLOMAX) capsule 0.4 mg  0.4 mg Oral Daily Annice Needy, MD   0.4 mg at 09/09/18 1204  . vitamin C (ASCORBIC ACID) tablet 500 mg  500 mg Oral  BID Annice Needy, MD   500 mg at 09/09/18 2346     Abtx:  Anti-infectives (From admission, onward)   Start     Dose/Rate Route Frequency Ordered Stop   09/10/18 1500  linezolid (ZYVOX) tablet 600 mg     600 mg Oral Every 12 hours 09/10/18 1457     09/08/18 1200  vancomycin (VANCOCIN) IVPB 750 mg/150 ml premix  Status:  Discontinued     750 mg 150 mL/hr over 60 Minutes Intravenous Every 24 hours 09/08/18 0740 09/10/18 1457   09/07/18 2100  ceFAZolin (ANCEF) IVPB 2g/100 mL premix     2 g 200 mL/hr over 30 Minutes Intravenous Every 8 hours 09/07/18 2029 09/08/18 0722   09/07/18 1830  ceFAZolin (ANCEF) IVPB 2g/100 mL premix  Status:  Discontinued     2 g 200 mL/hr over 30 Minutes Intravenous Every 8 hours 09/07/18 1807 09/07/18 2029   09/07/18 1500  vancomycin (VANCOCIN) 500 mg in sodium chloride 0.9 % 100 mL IVPB  Status:  Discontinued     500 mg 100 mL/hr over 60 Minutes Intravenous Every 24 hours 09/07/18 0727 09/08/18 0740   09/06/18 1300   vancomycin (VANCOCIN) 500 mg in sodium chloride 0.9 % 100 mL IVPB  Status:  Discontinued     500 mg 100 mL/hr over 60 Minutes Intravenous Every 24 hours 09/05/18 1637 09/06/18 1630   09/05/18 2000  metroNIDAZOLE (FLAGYL) IVPB 500 mg  Status:  Discontinued     500 mg 100 mL/hr over 60 Minutes Intravenous Every 8 hours 09/05/18 1940 09/08/18 1157   09/05/18 2000  ceFEPIme (MAXIPIME) 2 g in sodium chloride 0.9 % 100 mL IVPB  Status:  Discontinued     2 g 200 mL/hr over 30 Minutes Intravenous Every 24 hours 09/05/18 1637 09/07/18 1751   09/05/18 1945  ceFEPIme (MAXIPIME) 2 g in sodium chloride 0.9 % 100 mL IVPB  Status:  Discontinued     2 g 200 mL/hr over 30 Minutes Intravenous  Once 09/05/18 1940 09/05/18 1944   09/05/18 1945  vancomycin (VANCOCIN) IVPB 1000 mg/200 mL premix  Status:  Discontinued     1,000 mg 200 mL/hr over 60 Minutes Intravenous  Once 09/05/18 1940 09/05/18 1945   09/05/18 1130  piperacillin-tazobactam (ZOSYN) IVPB 3.375 g     3.375 g 100 mL/hr over 30 Minutes Intravenous  Once 09/05/18 1115 09/05/18 1307   09/05/18 1130  vancomycin (VANCOCIN) IVPB 1000 mg/200 mL premix     1,000 mg 200 mL/hr over 60 Minutes Intravenous  Once 09/05/18 1115 09/05/18 1410      REVIEW OF SYSTEMS:  NA Objective:  VITALS:  BP (!) 99/55 (BP Location: Left Arm)   Pulse 90   Temp 97.6 F (36.4 C) (Oral)   Resp (!) 22   Ht  (1.575 m)   Wt 39 kg   SpO2 99%   BMI 15.73 kg/m  PHYSICAL EXAM:  General: sleepy, on calling her names opens her eyes and responds-says she is in pain Pale Back: sacral decub- unstageable    Lungs:b/l air entry. Heart:s1s2 Abdomen: Soft, foley Extremities: left BKA stump healed Rt foot wound has almost healed- no evidence of infection Rt groin scab Left groin wound covered with wound vac Skin: No rashes or lesions. Or bruising Lymph: Cervical, supraclavicular normal. Neurologic: Grossly non-focal Pertinent Labs Lab Results CBC       Component Value Date/Time   WBC 10.6 (H) 09/10/2018 0553   RBC 2.89 (L)  09/10/2018 0553   HGB 8.8 (L) 09/10/2018 0553   HGB 13.6 10/14/2014 0644   HCT 28.5 (L) 09/10/2018 0553   HCT 41.3 10/14/2014 0644   PLT 218 09/10/2018 0553   PLT 156 10/14/2014 0644   MCV 98.6 09/10/2018 0553   MCV 96 10/14/2014 0644   MCH 30.4 09/10/2018 0553   MCHC 30.9 09/10/2018 0553   RDW 16.2 (H) 09/10/2018 0553   RDW 13.8 10/14/2014 0644   LYMPHSABS 0.8 09/05/2018 1141   LYMPHSABS 2.7 10/14/2014 0644   MONOABS 0.6 09/05/2018 1141   MONOABS 0.5 10/14/2014 0644   EOSABS 0.1 09/05/2018 1141   EOSABS 0.3 10/14/2014 0644   BASOSABS 0.1 09/05/2018 1141   BASOSABS 0.0 10/14/2014 0644    CMP Latest Ref Rng & Units 09/10/2018 09/08/2018 09/07/2018  Glucose 70 - 99 mg/dL 99 161(W) 960(A)  BUN 8 - 23 mg/dL 14 17 54(U)  Creatinine 0.44 - 1.00 mg/dL 9.81 1.91 4.78  Sodium 135 - 145 mmol/L 135 134(L) 131(L)  Potassium 3.5 - 5.1 mmol/L 4.3 4.4 4.5  Chloride 98 - 111 mmol/L 107 104 101  CO2 22 - 32 mmol/L 23 24 24   Calcium 8.9 - 10.3 mg/dL 7.5(L) 7.5(L) 7.6(L)  Total Protein 6.5 - 8.1 g/dL - - -  Total Bilirubin 0.3 - 1.2 mg/dL - - -  Alkaline Phos 38 - 126 U/L - - -  AST 15 - 41 U/L - - -  ALT 0 - 44 U/L - - -      Microbiology: Recent Results (from the past 240 hour(s))  Blood culture (routine x 2)     Status: None   Collection Time: 09/05/18 11:42 AM  Result Value Ref Range Status   Specimen Description BLOOD RFA  Final   Special Requests   Final    BOTTLES DRAWN AEROBIC AND ANAEROBIC Blood Culture adequate volume   Culture   Final    NO GROWTH 5 DAYS Performed at [facility], [address], [place], [place] [address]    Report Status 09/10/2018 FINAL  Final  Blood culture (routine x 2)     Status: Abnormal   Collection Time: 09/05/18 12:24 PM  Result Value Ref Range Status   Specimen Description   Final    BLOOD BLOOD RIGHT HAND Performed at [facility], [address], [place], [place] [address]    Special Requests   Final    BOTTLES DRAWN AEROBIC ONLY Blood Culture adequate volume Performed at [facility], [address], [place], [place] [address]    Culture  Setup Time   Final    GRAM POSITIVE COCCI IN CLUSTERS AEROBIC BOTTLE ONLY CRITICAL RESULT CALLED TO, READ BACK BY AND VERIFIED WITH: Kevin Fenton PATEL @1616  09/06/18 AKT    Culture (A)  Final    STAPHYLOCOCCUS SPECIES (COAGULASE NEGATIVE) THE SIGNIFICANCE OF ISOLATING THIS ORGANISM FROM A SINGLE SET OF BLOOD CULTURES WHEN MULTIPLE SETS ARE DRAWN IS UNCERTAIN. PLEASE NOTIFY THE MICROBIOLOGY DEPARTMENT WITHIN ONE WEEK IF SPECIATION AND SENSITIVITIES ARE REQUIRED. Performed at [facility], [address], [place], [place] [address]    Report Status 09/08/2018 FINAL  Final  Blood Culture ID Panel (Reflexed)     Status: Abnormal   Collection Time: 09/05/18 12:24 PM  Result Value Ref Range Status   Enterococcus species NOT DETECTED NOT DETECTED Final   Listeria monocytogenes NOT DETECTED NOT DETECTED Final   Staphylococcus species DETECTED (A) NOT DETECTED Final  Comment: Methicillin (oxacillin) resistant coagulase negative staphylococcus. Possible blood culture contaminant (unless isolated from more than one blood culture draw or clinical case suggests pathogenicity). No antibiotic treatment is indicated for blood  culture contaminants. CRITICAL RESULT CALLED TO, READ BACK BY AND VERIFIED WITH: Kevin Fenton PATEL @1616  09/06/18 AKT    Staphylococcus aureus (BCID) NOT DETECTED NOT DETECTED Final   Methicillin resistance DETECTED (A) NOT DETECTED Final    Comment: CRITICAL RESULT CALLED TO, READ BACK BY AND VERIFIED WITH: KISHAN PATEL @1616  09/06/18 AKT    Streptococcus species NOT DETECTED NOT DETECTED Final   Streptococcus agalactiae NOT DETECTED NOT DETECTED Final   Streptococcus pneumoniae NOT DETECTED NOT DETECTED Final   Streptococcus pyogenes NOT DETECTED NOT DETECTED Final    Acinetobacter baumannii NOT DETECTED NOT DETECTED Final   Enterobacteriaceae species NOT DETECTED NOT DETECTED Final   Enterobacter cloacae complex NOT DETECTED NOT DETECTED Final   Escherichia coli NOT DETECTED NOT DETECTED Final   Klebsiella oxytoca NOT DETECTED NOT DETECTED Final   Klebsiella pneumoniae NOT DETECTED NOT DETECTED Final   Proteus species NOT DETECTED NOT DETECTED Final   Serratia marcescens NOT DETECTED NOT DETECTED Final   Haemophilus influenzae NOT DETECTED NOT DETECTED Final   Neisseria meningitidis NOT DETECTED NOT DETECTED Final   Pseudomonas aeruginosa NOT DETECTED NOT DETECTED Final   Candida albicans NOT DETECTED NOT DETECTED Final   Candida glabrata NOT DETECTED NOT DETECTED Final   Candida krusei NOT DETECTED NOT DETECTED Final   Candida parapsilosis NOT DETECTED NOT DETECTED Final   Candida tropicalis NOT DETECTED NOT DETECTED Final    Comment: Performed at [facility], [address] [place], [place] [address]  Aerobic/Anaerobic Culture (surgical/deep wound)     Status: None (Preliminary result)   Collection Time: 09/06/18  1:14 PM  Result Value Ref Range Status   Specimen Description   Final    WOUND GROIN Performed at [facility], [address], [place], [place] [address]    Special Requests   Final    NONE Performed at [facility], [address] [place], [place] [address]    Gram Stain   Final    FEW WBC PRESENT, PREDOMINANTLY PMN MODERATE GRAM POSITIVE COCCI IN PAIRS FEW BUDDING YEAST SEEN FEW GRAM POSITIVE RODS Performed at [facility], [address] [place], [place] [address]    Culture   Final    MODERATE METHICILLIN RESISTANT STAPHYLOCOCCUS AUREUS NO ANAEROBES ISOLATED; CULTURE IN PROGRESS FOR 5 DAYS    Report Status PENDING  Incomplete   Organism ID, Bacteria METHICILLIN RESISTANT STAPHYLOCOCCUS AUREUS  Final      Susceptibility   Methicillin resistant staphylococcus aureus - MIC*     CIPROFLOXACIN >=8 RESISTANT Resistant     ERYTHROMYCIN >=8 RESISTANT Resistant     GENTAMICIN <=0.5 SENSITIVE Sensitive     OXACILLIN >=4 RESISTANT Resistant     TETRACYCLINE <=1 SENSITIVE Sensitive     VANCOMYCIN 1 SENSITIVE Sensitive     TRIMETH/SULFA <=10 SENSITIVE Sensitive     CLINDAMYCIN >=8 RESISTANT Resistant     RIFAMPIN <=0.5 SENSITIVE Sensitive     Inducible Clindamycin NEGATIVE Sensitive     * MODERATE METHICILLIN RESISTANT STAPHYLOCOCCUS AUREUS  Aerobic/Anaerobic Culture (surgical/deep wound)     Status: None (Preliminary result)   Collection Time: 09/07/18  3:08 PM  Result Value Ref Range Status   Specimen Description   Final    WOUND GROIN LEFT GROIN Performed at [facility], [address], [place], [place] [address]    Special Requests   Final    NONE Performed at [facility], [address] [place], [place] [address]    Gram Stain   Final    ABUNDANT WBC PRESENT, PREDOMINANTLY PMN MODERATE GRAM POSITIVE COCCI MODERATE YEAST    Culture   Final    MODERATE VANCOMYCIN RESISTANT ENTEROCOCCUS ISOLATED FEW STAPHYLOCOCCUS AUREUS SUSCEPTIBILITIES TO FOLLOW HOLDING FOR POSSIBLE ANAEROBE Performed at [facility], [address] [place], [place] [address]    Report Status PENDING  Incomplete   Organism ID, Bacteria VANCOMYCIN RESISTANT ENTEROCOCCUS ISOLATED  Final      Susceptibility   Vancomycin resistant enterococcus isolated - MIC*    AMPICILLIN >=32 RESISTANT Resistant     VANCOMYCIN >=32 RESISTANT Resistant     GENTAMICIN SYNERGY SENSITIVE Sensitive     LINEZOLID 2 SENSITIVE Sensitive     * MODERATE VANCOMYCIN RESISTANT ENTEROCOCCUS ISOLATED  Aerobic/Anaerobic Culture (surgical/deep wound)     Status: None (Preliminary result)   Collection Time: 09/07/18  3:09 PM  Result Value Ref Range Status   Specimen Description   Final    WOUND FEMORAL ARTERY LEFT GRAFT Performed at [facility], [address], [place], [place]  [address]    Special Requests   Final    NONE Performed at [facility], [address] [place], [place] [address]    Gram Stain   Final    MODERATE WBC PRESENT,BOTH PMN AND MONONUCLEAR NO ORGANISMS SEEN Performed at [facility], [address] [place], [place] [address]    Culture   Final    RARE METHICILLIN RESISTANT STAPHYLOCOCCUS AUREUS FEW VANCOMYCIN RESISTANT ENTEROCOCCUS ISOLATED NO ANAEROBES ISOLATED; CULTURE IN PROGRESS FOR 5 DAYS    Report Status PENDING  Incomplete   Organism ID, Bacteria METHICILLIN RESISTANT STAPHYLOCOCCUS AUREUS  Final   Organism ID, Bacteria VANCOMYCIN RESISTANT ENTEROCOCCUS ISOLATED  Final      Susceptibility   Methicillin resistant staphylococcus aureus - MIC*    CIPROFLOXACIN >=8 RESISTANT Resistant     ERYTHROMYCIN >=8 RESISTANT Resistant     GENTAMICIN <=0.5 SENSITIVE Sensitive     OXACILLIN >=4 RESISTANT Resistant     TETRACYCLINE <=1 SENSITIVE Sensitive     VANCOMYCIN <=0.5 SENSITIVE Sensitive     TRIMETH/SULFA <=10 SENSITIVE Sensitive     CLINDAMYCIN >=8 RESISTANT Resistant     RIFAMPIN <=0.5 SENSITIVE Sensitive     Inducible Clindamycin NEGATIVE Sensitive     * RARE METHICILLIN RESISTANT STAPHYLOCOCCUS AUREUS   Vancomycin resistant enterococcus isolated - MIC*    AMPICILLIN >=32 RESISTANT Resistant     VANCOMYCIN >=32 RESISTANT Resistant     GENTAMICIN SYNERGY SENSITIVE Sensitive     LINEZOLID 2 SENSITIVE Sensitive     * FEW VANCOMYCIN RESISTANT ENTEROCOCCUS ISOLATED    IMAGING RESULTS: CT pelvis  I have personally reviewed the films ? Impression/Recommendation ? 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 Has a wound vac- will need to see the wound  Need to treat this like endovascular infection- may need for  4 weeks  not sure whether she will be able to tolerate linezolid that long a period- will give for 2 weeks and reassess wound and weekly CBC and then decide.  No yeast on the graft culture- will ask lab whether they were able to culture the yeast-if not cultured she will not need any treatment for it   ?  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

## 2018-09-10 NOTE — Plan of Care (Signed)
PMT note:   Patient in the OR at the time of our visit. Contacted daughter to reschedule. Remeet at 9:00.

## 2018-09-10 NOTE — Discharge Instructions (Signed)
Vascular Surgery Discharge Instructions VAC dressing changes Monday-Wed-Friday Clean wound gently with sterile water or normal saline.  Gently pat dry. Redress wound with black VAC sponge. Settings: 125, Continuous

## 2018-09-10 NOTE — Progress Notes (Signed)
OT Cancellation Note  Patient Details Name: April [name] MRN: [id number] DOB: [birth year]   Cancelled Treatment:    Reason Eval/Treat Not Completed: Patient at procedure or test/ unavailable. Pt noted to be off the unit for a procedure. Will hold OT tx until pt medically appropriate for therapy. Please note, If pt undergoes general anesthesia, OT will require continue at transfer order to resume services.  Rockney Ghee, M.S., OTR/L Ascom: [phone] 09/10/18, 11:59 AM

## 2018-09-10 NOTE — Transfer of Care (Signed)
Immediate Anesthesia Transfer of Care Note  Patient: April [name]  Procedure(s) Performed: APPLICATION OF WOUND VAC ( VAC CHANGE ) (Left )  Patient Location: PACU  Anesthesia Type:General  Level of Consciousness: sedated  Airway & Oxygen Therapy: Patient Spontanous Breathing and Patient connected to face mask oxygen  Post-op Assessment: Report given to RN and Post -op Vital signs reviewed and stable  Post vital signs: Reviewed and stable  Last Vitals:  Vitals Value Taken Time  BP 98/58 09/10/2018  1:04 PM  Temp    Pulse 91 09/10/2018  1:05 PM  Resp 17 09/10/2018  1:05 PM  SpO2 100 % 09/10/2018  1:05 PM  Vitals shown include unvalidated device data.  Last Pain:  Vitals:   09/10/18 1250  TempSrc:   PainSc: Asleep      Patients Stated Pain Goal: 0 (09/08/18 0203)  Complications: No apparent anesthesia complications

## 2018-09-10 NOTE — Anesthesia Procedure Notes (Signed)
Date/Time: 09/10/2018 12:26 PM Performed by: Junious Silk, CRNA Pre-anesthesia Checklist: Patient identified, Emergency Drugs available, Suction available, Patient being monitored and Timeout performed Oxygen Delivery Method: Simple face mask

## 2018-09-10 NOTE — Progress Notes (Signed)
Patient arrived from OR asleep. Her blood glucose was checked at 1307 result was 72 it was reported to Dr. Noralyn Pick anesthesiologist and orders put in for half an amp of D50. Recheck shows result of 141 at 1332. Patient is still very lethargic after surgery. I will continue to monitor her.

## 2018-09-10 NOTE — Progress Notes (Signed)
Patient ID: April [name], female   DOB: 02/05/1956, 63 y.o.   MRN: [id number]  [employer] PROGRESS NOTE  April [name]:[id number] DOB: Jun 28, 1956 DOA: 09/05/2018 PCP: Mickel Fuchs, MD  HPI/Subjective: Patient is ready for the procedure to change wound VAC dressing no other complaints  Objective: Vitals:   09/10/18 1423 09/10/18 1444  BP: (!) 100/56 (!) 99/55  Pulse: 93 90  Resp: (!) 22   Temp: 98.1 F (36.7 C) 97.6 F (36.4 C)  SpO2: 100% 99%    Filed Weights   09/05/18 1056 09/05/18 1957  Weight: 39 kg 39 kg    ROS: Review of Systems  Constitutional: Negative for diaphoresis.  HENT: Negative for congestion, ear discharge, ear pain, nosebleeds and sore throat.   Eyes: Negative for blurred vision, pain and discharge.  Respiratory: Negative for shortness of breath.   Cardiovascular: Negative for chest pain, orthopnea and claudication.  Gastrointestinal: Negative for abdominal pain, diarrhea and vomiting.  Musculoskeletal: Positive for joint pain.  Neurological: Negative for tingling, tremors and headaches.  Psychiatric/Behavioral: Negative for depression and hallucinations.   Exam: Physical Exam  HENT:  Nose: No mucosal edema.  Mouth/Throat: No oropharyngeal exudate.  Eyes: Pupils are equal, round, and reactive to light. Conjunctivae and lids are normal.  Neck: Carotid bruit is not present. No thyromegaly present.  Cardiovascular: Regular rhythm, S1 normal, S2 normal and normal heart sounds.  Respiratory: She has no decreased breath sounds. She has no wheezes. She has no rhonchi. She has no rales.  GI: Soft. Bowel sounds are normal. There is no abdominal tenderness.  Musculoskeletal:     Left knee: She exhibits no swelling.     Right ankle: She exhibits no swelling.  Lymphadenopathy:    She has no cervical adenopathy.  Skin: Skin is warm. Nails show no clubbing.  Slight erythema over the skin of the left hip but no breaks in the skin on the left  buttock or left hip area.  She does have a stage II decubitus on the buttock.  Left thigh covered with wound VAC.      Data Reviewed: Basic Metabolic Panel: Recent Labs  Lab 09/05/18 1426 09/07/18 0431 09/08/18 0218 09/10/18 0553  NA 126* 131* 134* 135  K 5.0 4.5 4.4 4.3  CL 89* 101 104 107  CO2 27 24 24 23   GLUCOSE 544* 152* 226* 99  BUN 28* 24* 17 14  CREATININE 0.83 0.64 0.60 0.56  CALCIUM 7.7* 7.6* 7.5* 7.5*  MG  --   --   --  1.8   CBC: Recent Labs  Lab 09/05/18 1141 09/07/18 0431 09/08/18 0218 09/10/18 0553  WBC 18.4* 14.4* 13.8* 10.6*  NEUTROABS 16.7*  --   --   --   HGB 13.3 8.9* 9.2* 8.8*  HCT 42.5 28.6* 29.8* 28.5*  MCV 96.8 97.3 98.0 98.6  PLT 302 188 190 218   BNP (last 3 results) Recent Labs    06/10/18 0621  BNP 811.0*     CBG: Recent Labs  Lab 09/10/18 1112 09/10/18 1130 09/10/18 1307 09/10/18 1332 09/10/18 1443  GLUCAP 62* 126* 72 141* 98    Recent Results (from the past 240 hour(s))  Blood culture (routine x 2)     Status: None   Collection Time: 09/05/18 11:42 AM  Result Value Ref Range Status   Specimen Description BLOOD RFA  Final   Special Requests   Final    BOTTLES DRAWN AEROBIC AND ANAEROBIC Blood Culture adequate  volume   Culture   Final    NO GROWTH 5 DAYS Performed at [facility], [address] [place], [place] [address]    Report Status 09/10/2018 FINAL  Final  Blood culture (routine x 2)     Status: Abnormal   Collection Time: 09/05/18 12:24 PM  Result Value Ref Range Status   Specimen Description   Final    BLOOD BLOOD RIGHT HAND Performed at [facility], [address], [place], [place] [address]    Special Requests   Final    BOTTLES DRAWN AEROBIC ONLY Blood Culture adequate volume Performed at [facility], [address] [place], [place] [address]    Culture  Setup Time   Final    GRAM POSITIVE COCCI IN CLUSTERS AEROBIC BOTTLE ONLY CRITICAL RESULT CALLED TO, READ BACK  BY AND VERIFIED WITH: Kevin Fenton PATEL  09/06/18 AKT    Culture (A)  Final    STAPHYLOCOCCUS SPECIES (COAGULASE NEGATIVE) THE SIGNIFICANCE OF ISOLATING THIS ORGANISM FROM A SINGLE SET OF BLOOD CULTURES WHEN MULTIPLE SETS ARE DRAWN IS UNCERTAIN. PLEASE NOTIFY THE MICROBIOLOGY DEPARTMENT WITHIN ONE WEEK IF SPECIATION AND SENSITIVITIES ARE REQUIRED. Performed at [facility], [address], [place], [place] [address]    Report Status 09/08/2018 FINAL  Final  Blood Culture ID Panel (Reflexed)     Status: Abnormal   Collection Time: 09/05/18 12:24 PM  Result Value Ref Range Status   Enterococcus species NOT DETECTED NOT DETECTED Final   Listeria monocytogenes NOT DETECTED NOT DETECTED Final   Staphylococcus species DETECTED (A) NOT DETECTED Final    Comment: Methicillin (oxacillin) resistant coagulase negative staphylococcus. Possible blood culture contaminant (unless isolated from more than one blood culture draw or clinical case suggests pathogenicity). No antibiotic treatment is indicated for blood  culture contaminants. CRITICAL RESULT CALLED TO, READ BACK BY AND VERIFIED WITH: Kevin Fenton PATEL  09/06/18 AKT    Staphylococcus aureus (BCID) NOT DETECTED NOT DETECTED Final   Methicillin resistance DETECTED (A) NOT DETECTED Final    Comment: CRITICAL RESULT CALLED TO, READ BACK BY AND VERIFIED WITH: Kevin Fenton PATEL  09/06/18 AKT    Streptococcus species NOT DETECTED NOT DETECTED Final   Streptococcus agalactiae NOT DETECTED NOT DETECTED Final   Streptococcus pneumoniae NOT DETECTED NOT DETECTED Final   Streptococcus pyogenes NOT DETECTED NOT DETECTED Final   Acinetobacter baumannii NOT DETECTED NOT DETECTED Final   Enterobacteriaceae species NOT DETECTED NOT DETECTED Final   Enterobacter cloacae complex NOT DETECTED NOT DETECTED Final   Escherichia coli NOT DETECTED NOT DETECTED Final   Klebsiella oxytoca NOT DETECTED NOT DETECTED Final   Klebsiella pneumoniae NOT DETECTED NOT  DETECTED Final   Proteus species NOT DETECTED NOT DETECTED Final   Serratia marcescens NOT DETECTED NOT DETECTED Final   Haemophilus influenzae NOT DETECTED NOT DETECTED Final   Neisseria meningitidis NOT DETECTED NOT DETECTED Final   Pseudomonas aeruginosa NOT DETECTED NOT DETECTED Final   Candida albicans NOT DETECTED NOT DETECTED Final   Candida glabrata NOT DETECTED NOT DETECTED Final   Candida krusei NOT DETECTED NOT DETECTED Final   Candida parapsilosis NOT DETECTED NOT DETECTED Final   Candida tropicalis NOT DETECTED NOT DETECTED Final    Comment: Performed at [facility], [address] [place], [place] [address]  Aerobic/Anaerobic Culture (surgical/deep wound)     Status: None (Preliminary result)   Collection Time: 09/06/18  1:14 PM  Result Value Ref Range Status   Specimen Description  Final    WOUND GROIN Performed at [facility], [address] [place], [place] [address]    Special Requests   Final    NONE Performed at [facility], [address] [place], [place] [address]    Gram Stain   Final    FEW WBC PRESENT, PREDOMINANTLY PMN MODERATE GRAM POSITIVE COCCI IN PAIRS FEW BUDDING YEAST SEEN FEW GRAM POSITIVE RODS Performed at [facility], [address] [place], [place] [address]    Culture   Final    MODERATE METHICILLIN RESISTANT STAPHYLOCOCCUS AUREUS NO ANAEROBES ISOLATED; CULTURE IN PROGRESS FOR 5 DAYS    Report Status PENDING  Incomplete   Organism ID, Bacteria METHICILLIN RESISTANT STAPHYLOCOCCUS AUREUS  Final      Susceptibility   Methicillin resistant staphylococcus aureus - MIC*    CIPROFLOXACIN >=8 RESISTANT Resistant     ERYTHROMYCIN >=8 RESISTANT Resistant     GENTAMICIN <=0.5 SENSITIVE Sensitive     OXACILLIN >=4 RESISTANT Resistant     TETRACYCLINE <=1 SENSITIVE Sensitive     VANCOMYCIN 1 SENSITIVE Sensitive     TRIMETH/SULFA <=10 SENSITIVE Sensitive     CLINDAMYCIN >=8 RESISTANT Resistant      RIFAMPIN <=0.5 SENSITIVE Sensitive     Inducible Clindamycin NEGATIVE Sensitive     * MODERATE METHICILLIN RESISTANT STAPHYLOCOCCUS AUREUS  Aerobic/Anaerobic Culture (surgical/deep wound)     Status: None (Preliminary result)   Collection Time: 09/07/18  3:08 PM  Result Value Ref Range Status   Specimen Description   Final    WOUND GROIN LEFT GROIN Performed at [facility], [address] [place], [place] [address]    Special Requests   Final    NONE Performed at [facility], [address] [place], [place] [address]    Gram Stain   Final    ABUNDANT WBC PRESENT, PREDOMINANTLY PMN MODERATE GRAM POSITIVE COCCI MODERATE YEAST    Culture   Final    MODERATE VANCOMYCIN RESISTANT ENTEROCOCCUS ISOLATED FEW STAPHYLOCOCCUS AUREUS SUSCEPTIBILITIES TO FOLLOW HOLDING FOR POSSIBLE ANAEROBE Performed at [facility], [address] [place], [place] [address]    Report Status PENDING  Incomplete   Organism ID, Bacteria VANCOMYCIN RESISTANT ENTEROCOCCUS ISOLATED  Final      Susceptibility   Vancomycin resistant enterococcus isolated - MIC*    AMPICILLIN >=32 RESISTANT Resistant     VANCOMYCIN >=32 RESISTANT Resistant     GENTAMICIN SYNERGY SENSITIVE Sensitive     LINEZOLID 2 SENSITIVE Sensitive     * MODERATE VANCOMYCIN RESISTANT ENTEROCOCCUS ISOLATED  Aerobic/Anaerobic Culture (surgical/deep wound)     Status: None (Preliminary result)   Collection Time: 09/07/18  3:09 PM  Result Value Ref Range Status   Specimen Description   Final    WOUND FEMORAL ARTERY LEFT GRAFT Performed at [facility], [address] [place], [place] [address]    Special Requests   Final    NONE Performed at [facility], [address] [place], [place] [address]    Gram Stain   Final    MODERATE WBC PRESENT,BOTH PMN AND MONONUCLEAR NO ORGANISMS SEEN Performed at [facility], [address] [place], [place] [address]    Culture   Final    RARE  METHICILLIN RESISTANT STAPHYLOCOCCUS AUREUS FEW VANCOMYCIN RESISTANT ENTEROCOCCUS ISOLATED NO ANAEROBES ISOLATED; CULTURE IN PROGRESS FOR 5 DAYS    Report Status PENDING  Incomplete   Organism ID, Bacteria METHICILLIN RESISTANT  STAPHYLOCOCCUS AUREUS  Final   Organism ID, Bacteria VANCOMYCIN RESISTANT ENTEROCOCCUS ISOLATED  Final      Susceptibility   Methicillin resistant staphylococcus aureus - MIC*    CIPROFLOXACIN >=8 RESISTANT Resistant     ERYTHROMYCIN >=8 RESISTANT Resistant     GENTAMICIN <=0.5 SENSITIVE Sensitive     OXACILLIN >=4 RESISTANT Resistant     TETRACYCLINE <=1 SENSITIVE Sensitive     VANCOMYCIN <=0.5 SENSITIVE Sensitive     TRIMETH/SULFA <=10 SENSITIVE Sensitive     CLINDAMYCIN >=8 RESISTANT Resistant     RIFAMPIN <=0.5 SENSITIVE Sensitive     Inducible Clindamycin NEGATIVE Sensitive     * RARE METHICILLIN RESISTANT STAPHYLOCOCCUS AUREUS   Vancomycin resistant enterococcus isolated - MIC*    AMPICILLIN >=32 RESISTANT Resistant     VANCOMYCIN >=32 RESISTANT Resistant     GENTAMICIN SYNERGY SENSITIVE Sensitive     LINEZOLID 2 SENSITIVE Sensitive     * FEW VANCOMYCIN RESISTANT ENTEROCOCCUS ISOLATED     Studies: No results found.  Scheduled Meds: . aspirin  81 mg Oral Daily  . atorvastatin  40 mg Oral Daily  . collagenase   Topical Daily  . dextrose      . docusate sodium  100 mg Oral Daily  . enoxaparin (LOVENOX) injection  30 mg Subcutaneous Q24H  . ferrous sulfate  325 mg Oral BID WC  . gabapentin  100 mg Oral QHS  . insulin aspart  0-5 Units Subcutaneous QHS  . insulin aspart  0-9 Units Subcutaneous TID WC  . insulin glargine  15 Units Subcutaneous QHS  . linezolid  600 mg Oral Q12H  . midodrine  10 mg Oral TID WC  . mometasone-formoterol  2 puff Inhalation BID  . multivitamin-lutein  1 capsule Oral Daily  . pantoprazole  40 mg Oral Daily  . polyethylene glycol  17 g Oral Daily  . povidone-iodine   Topical BID  . Ensure Max Protein  11 oz Oral BID   . sodium phosphate  1 enema Rectal Once  . tamsulosin  0.4 mg Oral Daily  . ascorbic acid  500 mg Oral BID   Continuous Infusions: . sodium chloride Stopped (09/09/18 0754)  . sodium chloride    . sodium chloride 40 mL/hr at 09/09/18 1836  . sodium chloride 75 mL/hr at 09/10/18 1505  . magnesium sulfate 1 - 4 g bolus IVPB      Assessment/Plan:  1. Clinical sepsis with left groin infection.  MRSA growing out of wound culture.  On vancomycin.  Blood culture results with vancomycin-resistant enterococcus and Staphylococcus aureus.  Vancomycin discontinued patient started on Zyvox ID consult placed blood culture likely skin contamination.  Wound VAC dressing change in OR by vascular surgery today is need to be continued twice a week after discharge.  Outpatient follow-up with vascular surgery in 2 weeks after discharge 2. Left buttock hematoma.  Xarelto on hold.  We will try to resume in a.m. if clinically stable. surgery signed off 3. Hyponatremia.  Improved with IV fluid 4. Type 2 diabetes mellitus on low-dose glargine and sliding scale 5. Stage II decubiti local wound care 6. Severe malnutrition 7. Chronic hypotension on midodrine 8. Hyperlipidemia unspecified on atorvastatin 9. Diabetic neuropathy on gabapentin 10. GERD on PPI 11. Peripheral vascular disease.  Holding Xarelto at this time.  Left leg amputation site looks good. 12. Pain control will be an issue because she gets decreased respirations once given pain medications.  Try to convert over to Tylenol  as quickly as possible.  Trial of tramadol instead.  Code Status:     Code Status Orders  (From admission, onward)         Start     Ordered   09/05/18 1941  Full code  Continuous     09/05/18 1940        Code Status History    Date Active Date Inactive Code Status Order ID Comments User Context   08/21/2018 1658 08/25/2018 1932 DNR [id number]  Adrian Saran, MD ED   08/21/2018 1656 08/21/2018 1658 DNR [id number]  Adrian Saran, MD ED   07/21/2018 1302 07/26/2018 2148 DNR [id number]  Glee Arvin, NP Inpatient   07/09/2018 1730 07/21/2018 1302 Partial Code [id number]  Mayo, Allyn Kenner, MD Inpatient   06/03/2018 0621 06/13/2018 2013 Full Code [id number]  Arnaldo Natal, MD Inpatient   05/23/2018 0253 06/01/2018 2158 Full Code [id number]  Cammy Copa, MD Inpatient   05/23/2018 0138 05/23/2018 0253 DNR [id number]  Cammy Copa, MD ED   04/10/2018 1111 04/11/2018 1554 DNR [id number]  Milagros Loll, MD Inpatient   04/09/2018 1519 04/10/2018 1111 Full Code [id number]  Milagros Loll, MD Inpatient   04/09/2018 1337 04/09/2018 1519 DNR [id number]  Milagros Loll, MD Inpatient   04/09/2018 0012 04/09/2018 1337 Full Code [id number]  Oralia Manis, MD ED     Family Communication: Spoke with daughter yesterday Disposition Plan: To be determined  Consultants:  Vascular surgery  General surgery  Antibiotics:  Vancomycin  Time spent: 35  minutes, case discussed with pharmacist and nursing staff  [facility]  [employer] [facility]

## 2018-09-10 NOTE — H&P (Signed)
[facility] History & Physical Update  The patient was interviewed and re-examined.  The patient's previous History and Physical has been reviewed and is unchanged.  There is no change in the plan of care. We plan to proceed with the scheduled procedure.  Festus Barren, MD  09/10/2018, 11:19 AM

## 2018-09-10 NOTE — Progress Notes (Signed)
SUBJECTIVE:  Patient ID: April [name], female    DOB: 1955-12-09, 63 y.o.   MRN: [id number] Chief Complaint  Patient presents with  . Follow-up    HPI  April KillingsDoris J Marchesi is a 63 y.o. female patient presents today for removal of staples from her bilateral femoral endarterectomies as well as her left below-knee amputation.  Previously the patient had some dehiscence as well as very wet, foul-smelling drainage from both groin sites.  She was given Keflex for 10 days cycle however she only took approximately 6 days worth.  This is due to the fact that she went to the hospital for a COPD exacerbation and she was instructed to stop her Keflex following this.  Today her sites look very good.  They are dry with no drainage.  They are well approximated with no dehiscence.  The left below-knee amputation continues to look well.  She denies any fever, chills, nausea, vomiting or diarrhea.  The patient also complains of pain however she still has pain medication at home.  Past Medical History:  Diagnosis Date  . Anxiety   . Colon polyps   . COPD (chronic obstructive pulmonary disease) (HCC)   . Diabetes mellitus without complication (HCC)   . DVT (deep venous thrombosis) (HCC)   . DVT (deep venous thrombosis) (HCC)   . History of colonic polyps   . Hyperlipidemia   . Peripheral artery disease (HCC)   . Pulmonary nodules/lesions, multiple 08/2012   NEGATIVE PET SCAN  . Retroperitoneal abscess (HCC)    history of in 04/2013  . Tobacco abuse     Past Surgical History:  Procedure Laterality Date  . AMPUTATION Left 07/24/2018   Procedure: AMPUTATION BELOW KNEE;  Surgeon: Annice Needyew, Jason S, MD;  Location: [facility];  Service: Vascular;  Laterality: Left;  . APPLICATION OF WOUND VAC Right 07/11/2018   Procedure: APPLICATION OF WOUND VAC;  Surgeon: Gwyneth RevelsFowler, Justin, DPM;  Location: [facility];  Service: Podiatry;  Laterality: Right;  . APPLICATION OF WOUND VAC Left 09/07/2018   Procedure: APPLICATION OF  WOUND VAC;  Surgeon: Louisa SecondAntezana, James, MD;  Location: [facility];  Service: Vascular;  Laterality: Left;  . CHOLECYSTECTOMY    . COLONOSCOPY  05/2007  . ENDARTERECTOMY FEMORAL Bilateral 07/17/2018   Procedure: ENDARTERECTOMY FEMORAL - BILATERAL SFA STENT PLACEMENT - LEFT;  Surgeon: Annice Needyew, Jason S, MD;  Location: [facility];  Service: Vascular;  Laterality: Bilateral;  . FEMORAL ARTERY EXPLORATION  09/07/2018   Procedure: FEMORAL ARTERY EXPLORATION AND REVISION SAPHENOUS VEIN HARVEST FROM RIGHT LEG;  Surgeon: Louisa SecondAntezana, James, MD;  Location: [facility];  Service: Vascular;;  . IRRIGATION AND DEBRIDEMENT FOOT Bilateral 05/25/2018   Procedure: IRRIGATION AND DEBRIDEMENT FOOT application wound vac left  foot;  Surgeon: Recardo Evangelistroxler, Matthew, DPM;  Location: [facility];  Service: Podiatry;  Laterality: Bilateral;  . IRRIGATION AND DEBRIDEMENT FOOT Right 07/11/2018   Procedure: FOOT DEBRIDEMENT;  Surgeon: Gwyneth RevelsFowler, Justin, DPM;  Location: [facility];  Service: Podiatry;  Laterality: Right;  . LOWER EXTREMITY ANGIOGRAPHY Left 05/26/2018   Procedure: Lower Extremity Angiography with possible intervention;  Surgeon: Annice Needyew, Jason S, MD;  Location: [facility];  Service: Cardiovascular;  Laterality: Left;  . LOWER EXTREMITY ANGIOGRAPHY Right 05/27/2018   Procedure: Lower Extremity Angiography;  Surgeon: Renford DillsSchnier, Gregory G, MD;  [facility];  Service: Cardiovascular;  Laterality: Right;  . LOWER EXTREMITY ANGIOGRAPHY Right 07/15/2018   Procedure: Lower Extremity Angiography;  Surgeon: Renford DillsSchnier, Gregory G, MD;  [facility];  Service: Cardiovascular;  Laterality: Right;  . WOUND EXPLORATION Left 09/07/2018   Procedure: WOUND EXPLORATION;  Surgeon: Louisa Second, MD;  Location: [facility];  Service: Vascular;  Laterality: Left;    Social History   Socioeconomic History  . Marital status: Divorced    Spouse name: Not on file  . Number of children: Not on file  . Years of education: Not on  file  . Highest education level: Not on file  Occupational History  . Not on file  Social Needs  . Financial resource strain: Not on file  . Food insecurity:    Worry: Not on file    Inability: Not on file  . Transportation needs:    Medical: Not on file    Non-medical: Not on file  Tobacco Use  . Smoking status: Current Every Day Smoker    Packs/day: 1.50    Years: 47.00    Pack years: 70.50    Types: Cigarettes  . Smokeless tobacco: Never Used  . Tobacco comment: Smoking History 2.5-started at age 35  Substance and Sexual Activity  . Alcohol use: No    Alcohol/week: 0.0 standard drinks  . Drug use: No  . Sexual activity: Not on file  Lifestyle  . Physical activity:    Days per week: Not on file    Minutes per session: Not on file  . Stress: Not on file  Relationships  . Social connections:    Talks on phone: Not on file    Gets together: Not on file    Attends religious service: Not on file    Active member of club or organization: Not on file    Attends meetings of clubs or organizations: Not on file    Relationship status: Not on file  . Intimate partner violence:    Fear of current or ex partner: Not on file    Emotionally abused: Not on file    Physically abused: Not on file    Forced sexual activity: Not on file  Other Topics Concern  . Not on file  Social History Narrative  . Not on file    Family History  Problem Relation Age of Onset  . Brain cancer Mother     Allergies  Allergen Reactions  . Eggs Or Egg-Derived Products Diarrhea, Itching and Nausea And Vomiting     Review of Systems   Review of Systems: Negative Unless Checked Constitutional: [] Weight loss  [] Fever  [] Chills Cardiac: [] Chest pain   []  Atrial Fibrillation  [] Palpitations   [] Shortness of breath when laying flat   [] Shortness of breath with exertion. [] Shortness of breath at rest Vascular:  [] Pain in legs with walking   [] Pain in legs with standing [] Pain in legs when laying  flat   [] Claudication    [] Pain in feet when laying flat    [x] History of DVT   [] Phlebitis   [] Swelling in legs   [] Varicose veins   [] Non-healing ulcers Pulmonary:   [] Uses home oxygen   [] Productive cough   [] Hemoptysis   [] Wheeze  [x] COPD   [] Asthma Neurologic:  [] Dizziness   [] Seizures  [] Blackouts [] History of stroke   [] History of TIA  [] Aphasia   [] Temporary Blindness   [] Weakness or numbness in arm   [] Weakness or numbness in leg Musculoskeletal:   [] Joint swelling   [] Joint pain   [] Low back pain  []  History of Knee Replacement [] Arthritis [] back Surgeries  []  Spinal Stenosis    Hematologic:  []   Easy bruising  Easy bleeding   Hypercoagulable state   Anemic Gastrointestinal:  Diarrhea   Vomiting  Gastroesophageal reflux/heartburn   Difficulty swallowing. Abdominal pain Genitourinary:  Chronic kidney disease   Difficult urination  Anuric   Blood in urine Frequent urination  Burning with urination   Hematuria Skin:  Rashes   Ulcers Wounds Psychological:  History of anxiety    History of major depression   Memory Difficulties      OBJECTIVE:   Physical Exam  BP 120/77 (BP Location: Right Arm)   Pulse 96   Resp 16   Gen: WD/WN, NAD, appears malnourished Head: Welcome/AT, No temporalis wasting.  Ear/Nose/Throat: Hearing grossly intact, nares w/o erythema or drainage Eyes: PER, EOMI, sclera nonicteric.  Neck: Supple, no masses.  No JVD.  Pulmonary:  Good air movement, no use of accessory muscles.  Cardiac: RRR Vascular:  Bilateral groin incisions from femoral endarterectomy.  Left below-knee amputation.  Both sites are clean dry and intact both sites are well approximated Vessel Right Left  Radial Palpable Palpable  Gastrointestinal: soft, non-distended. No guarding/no peritoneal signs.  Musculoskeletal: M/S 5/5 throughout.    Left below-knee amputation Neurologic: Pain and light touch intact in extremities.  Symmetrical.  Speech is fluent. Motor  exam as listed above. Psychiatric: Judgment intact, Mood & affect appropriate for pt's clinical situation. Dermatologic: No Venous rashes. No Ulcers Noted.  No changes consistent with cellulitis. Lymph : No Cervical lymphadenopathy, no lichenification or skin changes of chronic lymphedema.       ASSESSMENT AND PLAN:  1. COPD exacerbation (HCC) Continue pulmonary medications and aerosols as already ordered, these medications have been reviewed and there are no changes at this time.    2. Surgical site infection Bilateral groins appear clean dry and intact.  Staples will be removed today.  The previous redness, dehiscence that was seen before is gone despite not completing the course of antibiotics.  Overall, the bilateral groin sites look good.  3. Hyperlipidemia, unspecified hyperlipidemia type Continue statin as ordered and reviewed, no changes at this time   4. History of left below knee amputation (HCC) Staples removed today from below-knee amputation.  Site was well approximated with no drainage.  It is clean dry and intact.  Steri-Strips applied at site.  We will have the patient return in 6 weeks to assess stump as well as groin sites and possibly place referral for prosthesis as well as gait training.   No current facility-administered medications on file prior to visit.    Current Outpatient Medications on File Prior to Visit  Medication Sig Dispense Refill  . albuterol (PROVENTIL HFA;VENTOLIN HFA) 108 (90 Base) MCG/ACT inhaler Inhale 2 puffs into the lungs every 6 (six) hours as needed for wheezing or shortness of breath.    Marland Kitchen albuterol (PROVENTIL) (2.5 MG/3ML) 0.083% nebulizer solution Inhale 2.5 mg into the lungs every 6 (six) hours as needed for wheezing or shortness of breath.     . ALPRAZolam (XANAX) 0.5 MG tablet Take 1 tablet (0.5 mg total) by mouth 2 (two) times daily as needed for anxiety. 30 tablet 0  . aspirin 81 MG chewable tablet Chew 81 mg by mouth daily.    Marland Kitchen  atorvastatin (LIPITOR) 40 MG tablet Take 40 mg by mouth daily.    . collagenase (SANTYL) ointment Apply topically daily. 15 g 0  . Fluticasone-Salmeterol (ADVAIR DISKUS) 250-50 MCG/DOSE AEPB Inhale 1 puff into the lungs 2 (two) times daily. 1 each 0  . furosemide (  LASIX) 40 MG tablet Take 1 tablet (40 mg total) by mouth daily. 30 tablet 0  . gabapentin (NEURONTIN) 100 MG capsule Take 1 capsule (100 mg total) by mouth at bedtime.    Marland Kitchen guaiFENesin-dextromethorphan (ROBITUSSIN DM) 100-10 MG/5ML syrup Take 5 mLs by mouth every 4 (four) hours as needed for cough. 118 mL 0  . insulin aspart (NOVOLOG) 100 UNIT/ML injection Inject 3 Units into the skin 3 (three) times daily with meals. 10 mL 11  . insulin glargine (LANTUS) 100 UNIT/ML injection Inject 0.15 mLs (15 Units total) into the skin at bedtime. 10 mL 11  . ipratropium-albuterol (DUONEB) 0.5-2.5 (3) MG/3ML SOLN Take 3 mLs by nebulization 3 (three) times daily. 360 mL 2  . metFORMIN (GLUCOPHAGE) 500 MG tablet Take 500 mg by mouth daily with breakfast.     . midodrine (PROAMATINE) 10 MG tablet Take 1 tablet (10 mg total) by mouth 3 (three) times daily with meals.    . pantoprazole (PROTONIX) 40 MG tablet Take 40 mg by mouth 2 (two) times daily.     . polyethylene glycol (MIRALAX / GLYCOLAX) packet Take 17 g by mouth daily. 14 each 0  . potassium chloride (K-DUR,KLOR-CON) 10 MEQ tablet Take by mouth.    . protein supplement shake (PREMIER PROTEIN) LIQD Take 325 mLs (11 oz total) by mouth 2 (two) times daily between meals. 11 Can 0  . rivaroxaban (XARELTO) 10 MG TABS tablet Take 10 mg by mouth every evening.    Marland Kitchen SPIRIVA HANDIHALER 18 MCG inhalation capsule Place 18 mcg into inhaler and inhale daily.     . tamsulosin (FLOMAX) 0.4 MG CAPS capsule Take 1 capsule (0.4 mg total) by mouth daily. 30 capsule 0  . vitamin C (VITAMIN C) 500 MG tablet Take 1 tablet (500 mg total) by mouth 2 (two) times daily. 60 tablet 0  . ferrous sulfate 325 (65 FE) MG tablet  Take 1 tablet (325 mg total) by mouth 2 (two) times daily with a meal. 60 tablet 0    There are no Patient Instructions on file for this visit. No follow-ups on file.   Georgiana Spinner, NP  This note was completed with Office manager.  Any errors are purely unintentional.

## 2018-09-10 NOTE — Anesthesia Post-op Follow-up Note (Signed)
Anesthesia QCDR form completed.        

## 2018-09-10 NOTE — Progress Notes (Signed)
Patient ID: April [name], female   DOB: [birth year], 63 y.o.   MRN: [id number]  [facility] PROGRESS NOTE  April [name] WUJ:[id number] DOB: [birth year] DOA: 09/05/2018 PCP: Mickel FuchsWroth, Thomas H, MD  HPI/Subjective: Patient is in pain and asking for pain medications no other complaints  Objective: Vitals:   09/10/18 1423 09/10/18 1444  BP: (!) 100/56 (!) 99/55  Pulse: 93 90  Resp: (!) 22   Temp: 98.1 F (36.7 C) 97.6 F (36.4 C)  SpO2: 100% 99%    Filed Weights   09/05/18 1056 09/05/18 1957  Weight: 39 kg 39 kg    ROS: Review of Systems  Constitutional: Negative for diaphoresis.  HENT: Negative for congestion, ear discharge, ear pain, nosebleeds and sore throat.   Eyes: Negative for blurred vision, pain and discharge.  Respiratory: Negative for shortness of breath.   Cardiovascular: Negative for chest pain, orthopnea and claudication.  Gastrointestinal: Negative for abdominal pain, diarrhea and vomiting.  Musculoskeletal: Positive for joint pain.  Neurological: Negative for tingling, tremors and headaches.  Psychiatric/Behavioral: Negative for depression and hallucinations.   Exam: Physical Exam  HENT:  Nose: No mucosal edema.  Mouth/Throat: No oropharyngeal exudate.  Eyes: Pupils are equal, round, and reactive to light. Conjunctivae and lids are normal.  Neck: Carotid bruit is not present. No thyromegaly present.  Cardiovascular: Regular rhythm, S1 normal, S2 normal and normal heart sounds.  Respiratory: She has no decreased breath sounds. She has no wheezes. She has no rhonchi. She has no rales.  GI: Soft. Bowel sounds are normal. There is no abdominal tenderness.  Musculoskeletal:     Left knee: She exhibits no swelling.     Right ankle: She exhibits no swelling.  Lymphadenopathy:    She has no cervical adenopathy.  Skin: Skin is warm. Nails show no clubbing.  Slight erythema over the skin of the left hip but no breaks in the skin on the left buttock or left  hip area.  She does have a stage II decubitus on the buttock.  Left thigh covered with wound VAC.      Data Reviewed: Basic Metabolic Panel: Recent Labs  Lab 09/05/18 1426 09/07/18 0431 09/08/18 0218 09/10/18 0553  NA 126* 131* 134* 135  K 5.0 4.5 4.4 4.3  CL 89* 101 104 107  CO2 27 24 24 23   GLUCOSE 544* 152* 226* 99  BUN 28* 24* 17 14  CREATININE 0.83 0.64 0.60 0.56  CALCIUM 7.7* 7.6* 7.5* 7.5*  MG  --   --   --  1.8   CBC: Recent Labs  Lab 09/05/18 1141 09/07/18 0431 09/08/18 0218 09/10/18 0553  WBC 18.4* 14.4* 13.8* 10.6*  NEUTROABS 16.7*  --   --   --   HGB 13.3 8.9* 9.2* 8.8*  HCT 42.5 28.6* 29.8* 28.5*  MCV 96.8 97.3 98.0 98.6  PLT 302 188 190 218   BNP (last 3 results) Recent Labs    06/10/18 0621  BNP 811.0*     CBG: Recent Labs  Lab 09/10/18 1112 09/10/18 1130 09/10/18 1307 09/10/18 1332 09/10/18 1443  GLUCAP 62* 126* 72 141* 98    Recent Results (from the past 240 hour(s))  Blood culture (routine x 2)     Status: None   Collection Time: 09/05/18 11:42 AM  Result Value Ref Range Status   Specimen Description BLOOD RFA  Final   Special Requests   Final    BOTTLES DRAWN AEROBIC AND ANAEROBIC Blood Culture adequate volume  Culture   Final    NO GROWTH 5 DAYS Performed at [facility], [address] [place], [place] [address]    Report Status 09/10/2018 FINAL  Final  Blood culture (routine x 2)     Status: Abnormal   Collection Time: 09/05/18 12:24 PM  Result Value Ref Range Status   Specimen Description   Final    BLOOD BLOOD RIGHT HAND Performed at [facility], [address], [place], [place] [address]    Special Requests   Final    BOTTLES DRAWN AEROBIC ONLY Blood Culture adequate volume Performed at [facility], [address] [place], [place] [address]    Culture  Setup Time   Final    GRAM POSITIVE COCCI IN CLUSTERS AEROBIC BOTTLE ONLY CRITICAL RESULT CALLED TO, READ BACK BY AND VERIFIED  WITH: Kevin Fenton PATEL  09/06/18 AKT    Culture (A)  Final    STAPHYLOCOCCUS SPECIES (COAGULASE NEGATIVE) THE SIGNIFICANCE OF ISOLATING THIS ORGANISM FROM A SINGLE SET OF BLOOD CULTURES WHEN MULTIPLE SETS ARE DRAWN IS UNCERTAIN. PLEASE NOTIFY THE MICROBIOLOGY DEPARTMENT WITHIN ONE WEEK IF SPECIATION AND SENSITIVITIES ARE REQUIRED. Performed at [facility], [address] [place], [place] [address]    Report Status 09/08/2018 FINAL  Final  Blood Culture ID Panel (Reflexed)     Status: Abnormal   Collection Time: 09/05/18 12:24 PM  Result Value Ref Range Status   Enterococcus species NOT DETECTED NOT DETECTED Final   Listeria monocytogenes NOT DETECTED NOT DETECTED Final   Staphylococcus species DETECTED (A) NOT DETECTED Final    Comment: Methicillin (oxacillin) resistant coagulase negative staphylococcus. Possible blood culture contaminant (unless isolated from more than one blood culture draw or clinical case suggests pathogenicity). No antibiotic treatment is indicated for blood  culture contaminants. CRITICAL RESULT CALLED TO, READ BACK BY AND VERIFIED WITH: Kevin Fenton PATEL  09/06/18 AKT    Staphylococcus aureus (BCID) NOT DETECTED NOT DETECTED Final   Methicillin resistance DETECTED (A) NOT DETECTED Final    Comment: CRITICAL RESULT CALLED TO, READ BACK BY AND VERIFIED WITH: KISHAN PATEL  09/06/18 AKT    Streptococcus species NOT DETECTED NOT DETECTED Final   Streptococcus agalactiae NOT DETECTED NOT DETECTED Final   Streptococcus pneumoniae NOT DETECTED NOT DETECTED Final   Streptococcus pyogenes NOT DETECTED NOT DETECTED Final   Acinetobacter baumannii NOT DETECTED NOT DETECTED Final   Enterobacteriaceae species NOT DETECTED NOT DETECTED Final   Enterobacter cloacae complex NOT DETECTED NOT DETECTED Final   Escherichia coli NOT DETECTED NOT DETECTED Final   Klebsiella oxytoca NOT DETECTED NOT DETECTED Final   Klebsiella pneumoniae NOT DETECTED NOT DETECTED Final    Proteus species NOT DETECTED NOT DETECTED Final   Serratia marcescens NOT DETECTED NOT DETECTED Final   Haemophilus influenzae NOT DETECTED NOT DETECTED Final   Neisseria meningitidis NOT DETECTED NOT DETECTED Final   Pseudomonas aeruginosa NOT DETECTED NOT DETECTED Final   Candida albicans NOT DETECTED NOT DETECTED Final   Candida glabrata NOT DETECTED NOT DETECTED Final   Candida krusei NOT DETECTED NOT DETECTED Final   Candida parapsilosis NOT DETECTED NOT DETECTED Final   Candida tropicalis NOT DETECTED NOT DETECTED Final    Comment: Performed at [facility], [address] [place], [place] [address]  Aerobic/Anaerobic Culture (surgical/deep wound)     Status: None (Preliminary result)   Collection Time: 09/06/18  1:14 PM  Result Value Ref Range Status   Specimen Description   Final  WOUND GROIN Performed at [facility], [address] [place], [place] [address]    Special Requests   Final    NONE Performed at [facility], [address] [place], [place] [address]    Gram Stain   Final    FEW WBC PRESENT, PREDOMINANTLY PMN MODERATE GRAM POSITIVE COCCI IN PAIRS FEW BUDDING YEAST SEEN FEW GRAM POSITIVE RODS Performed at [facility], [address] [place], [place] [address]    Culture   Final    MODERATE METHICILLIN RESISTANT STAPHYLOCOCCUS AUREUS NO ANAEROBES ISOLATED; CULTURE IN PROGRESS FOR 5 DAYS    Report Status PENDING  Incomplete   Organism ID, Bacteria METHICILLIN RESISTANT STAPHYLOCOCCUS AUREUS  Final      Susceptibility   Methicillin resistant staphylococcus aureus - MIC*    CIPROFLOXACIN >=8 RESISTANT Resistant     ERYTHROMYCIN >=8 RESISTANT Resistant     GENTAMICIN <=0.5 SENSITIVE Sensitive     OXACILLIN >=4 RESISTANT Resistant     TETRACYCLINE <=1 SENSITIVE Sensitive     VANCOMYCIN 1 SENSITIVE Sensitive     TRIMETH/SULFA <=10 SENSITIVE Sensitive     CLINDAMYCIN >=8 RESISTANT Resistant     RIFAMPIN <=0.5  SENSITIVE Sensitive     Inducible Clindamycin NEGATIVE Sensitive     * MODERATE METHICILLIN RESISTANT STAPHYLOCOCCUS AUREUS  Aerobic/Anaerobic Culture (surgical/deep wound)     Status: None (Preliminary result)   Collection Time: 09/07/18  3:08 PM  Result Value Ref Range Status   Specimen Description   Final    WOUND GROIN LEFT GROIN Performed at [facility], [address], [place], [place] [address]    Special Requests   Final    NONE Performed at [facility], [address] [place], [place] [address]    Gram Stain   Final    ABUNDANT WBC PRESENT, PREDOMINANTLY PMN MODERATE GRAM POSITIVE COCCI MODERATE YEAST    Culture   Final    MODERATE VANCOMYCIN RESISTANT ENTEROCOCCUS ISOLATED FEW STAPHYLOCOCCUS AUREUS SUSCEPTIBILITIES TO FOLLOW HOLDING FOR POSSIBLE ANAEROBE Performed at [facility], [address] [place], [place] [address]    Report Status PENDING  Incomplete   Organism ID, Bacteria VANCOMYCIN RESISTANT ENTEROCOCCUS ISOLATED  Final      Susceptibility   Vancomycin resistant enterococcus isolated - MIC*    AMPICILLIN >=32 RESISTANT Resistant     VANCOMYCIN >=32 RESISTANT Resistant     GENTAMICIN SYNERGY SENSITIVE Sensitive     LINEZOLID 2 SENSITIVE Sensitive     * MODERATE VANCOMYCIN RESISTANT ENTEROCOCCUS ISOLATED  Aerobic/Anaerobic Culture (surgical/deep wound)     Status: None (Preliminary result)   Collection Time: 09/07/18  3:09 PM  Result Value Ref Range Status   Specimen Description   Final    WOUND FEMORAL ARTERY LEFT GRAFT Performed at [facility], [address] [place] [address], [place], [place] [address]    Special Requests   Final    NONE Performed at [facility], [address] [place], [place] [address]    Gram Stain   Final    MODERATE WBC PRESENT,BOTH PMN AND MONONUCLEAR NO ORGANISMS SEEN Performed at [facility], [address], [place], [place] [address]    Culture   Final    RARE METHICILLIN  RESISTANT STAPHYLOCOCCUS AUREUS FEW VANCOMYCIN RESISTANT ENTEROCOCCUS ISOLATED NO ANAEROBES ISOLATED; CULTURE IN PROGRESS FOR 5 DAYS    Report Status PENDING  Incomplete   Organism ID, Bacteria METHICILLIN RESISTANT STAPHYLOCOCCUS AUREUS  Final  Organism ID, Bacteria VANCOMYCIN RESISTANT ENTEROCOCCUS ISOLATED  Final      Susceptibility   Methicillin resistant staphylococcus aureus - MIC*    CIPROFLOXACIN >=8 RESISTANT Resistant     ERYTHROMYCIN >=8 RESISTANT Resistant     GENTAMICIN <=0.5 SENSITIVE Sensitive     OXACILLIN >=4 RESISTANT Resistant     TETRACYCLINE <=1 SENSITIVE Sensitive     VANCOMYCIN <=0.5 SENSITIVE Sensitive     TRIMETH/SULFA <=10 SENSITIVE Sensitive     CLINDAMYCIN >=8 RESISTANT Resistant     RIFAMPIN <=0.5 SENSITIVE Sensitive     Inducible Clindamycin NEGATIVE Sensitive     * RARE METHICILLIN RESISTANT STAPHYLOCOCCUS AUREUS   Vancomycin resistant enterococcus isolated - MIC*    AMPICILLIN >=32 RESISTANT Resistant     VANCOMYCIN >=32 RESISTANT Resistant     GENTAMICIN SYNERGY SENSITIVE Sensitive     LINEZOLID 2 SENSITIVE Sensitive     * FEW VANCOMYCIN RESISTANT ENTEROCOCCUS ISOLATED     Studies: No results found.  Scheduled Meds: . aspirin  81 mg Oral Daily  . atorvastatin  40 mg Oral Daily  . collagenase   Topical Daily  . dextrose      . docusate sodium  100 mg Oral Daily  . enoxaparin (LOVENOX) injection  30 mg Subcutaneous Q24H  . ferrous sulfate  325 mg Oral BID WC  . gabapentin  100 mg Oral QHS  . insulin aspart  0-5 Units Subcutaneous QHS  . insulin aspart  0-9 Units Subcutaneous TID WC  . insulin glargine  15 Units Subcutaneous QHS  . linezolid  600 mg Oral Q12H  . midodrine  10 mg Oral TID WC  . mometasone-formoterol  2 puff Inhalation BID  . multivitamin-lutein  1 capsule Oral Daily  . pantoprazole  40 mg Oral Daily  . polyethylene glycol  17 g Oral Daily  . povidone-iodine   Topical BID  . Ensure Max Protein  11 oz Oral BID  . sodium  phosphate  1 enema Rectal Once  . tamsulosin  0.4 mg Oral Daily  . ascorbic acid  500 mg Oral BID   Continuous Infusions: . sodium chloride Stopped (09/09/18 0754)  . sodium chloride    . sodium chloride 40 mL/hr at 09/09/18 1836  . sodium chloride 75 mL/hr at 09/10/18 1505  . magnesium sulfate 1 - 4 g bolus IVPB      Assessment/Plan:  1. Clinical sepsis with left groin infection.  MRSA growing out of wound culture.  On vancomycin.  Blood culture results with vancomycin-resistant enterococcus and Staphylococcus aureus.  Vancomycin discontinued patient started on Zyvox ID consult placed blood culture likely skin contamination.  Wound VAC dressing change in OR by vascular surgery today is need to be continued twice a week after discharge.  Outpatient follow-up with vascular surgery in 2 weeks after discharge 2. Left buttock hematoma.  Xarelto on hold.  We will try to resume in a.m. if clinically stable. surgery signed off 3. Hyponatremia.  Improved with IV fluid 4. Type 2 diabetes mellitus on low-dose glargine and sliding scale 5. Stage II decubiti local wound care 6. Severe malnutrition 7. Chronic hypotension on midodrine 8. Hyperlipidemia unspecified on atorvastatin 9. Diabetic neuropathy on gabapentin 10. GERD on PPI 11. Peripheral vascular disease.  Holding Xarelto at this time.  Left leg amputation site looks good. 12. Pain control will be an issue because she gets decreased respirations once given pain medications.  Try to convert over to Tylenol as quickly as possible.  Trial  of tramadol instead.  Code Status:     Code Status Orders  (From admission, onward)         Start     Ordered   09/05/18 1941  Full code  Continuous     09/05/18 1940        Code Status History    Date Active Date Inactive Code Status Order ID Comments User Context   08/21/2018 1658 08/25/2018 1932 DNR [id number]  Adrian Saran, MD ED   08/21/2018 1656 08/21/2018 1658 DNR [id number]  Adrian Saran, MD ED    07/21/2018 1302 07/26/2018 2148 DNR [id number]  Glee Arvin, NP Inpatient   07/09/2018 1730 07/21/2018 1302 Partial Code [id number]  Mayo, Allyn Kenner, MD Inpatient   06/03/2018 0621 06/13/2018 2013 Full Code [id number]  Arnaldo Natal, MD Inpatient   05/23/2018 0253 06/01/2018 2158 Full Code [id number]  Cammy Copa, MD Inpatient   05/23/2018 0138 05/23/2018 0253 DNR [id number]  Cammy Copa, MD ED   04/10/2018 1111 04/11/2018 1554 DNR [id number]  Milagros Loll, MD Inpatient   04/09/2018 1519 04/10/2018 1111 Full Code [id number]  Milagros Loll, MD Inpatient   04/09/2018 1337 04/09/2018 1519 DNR [id number]  Milagros Loll, MD Inpatient   04/09/2018 0012 04/09/2018 1337 Full Code [id number]  Oralia Manis, MD ED     Family Communication: Spoke with daughter yesterday Disposition Plan: To be determined  Consultants:  Vascular surgery  General surgery  Antibiotics:  Vancomycin  Time spent: 35  minutes, case discussed with pharmacist and nursing staff  [facility]  [facility]

## 2018-09-10 NOTE — Progress Notes (Signed)
Patient ID: April [name] Holdren, female   DOB: [birth year], 63 y.o.   MRN: [id number]  [employer] PROGRESS NOTE  April [name] Puga [id number] DOB: [birth year] DOA: 09/05/2018 PCP: Mickel FuchsWroth, Thomas H, MD  HPI/Subjective: Patient is in pain and asking for pain medications no other complaints  Objective: Vitals:   09/10/18 1423 09/10/18 1444  BP: (!) 100/56 (!) 99/55  Pulse: 93 90  Resp: (!) 22   Temp: 98.1 F (36.7 C) 97.6 F (36.4 C)  SpO2: 100% 99%    Filed Weights   09/05/18 1056 09/05/18 1957  Weight: 39 kg 39 kg    ROS: Review of Systems  Constitutional: Negative for diaphoresis.  HENT: Negative for congestion, ear discharge, ear pain, nosebleeds and sore throat.   Eyes: Negative for blurred vision, pain and discharge.  Respiratory: Negative for shortness of breath.   Cardiovascular: Negative for chest pain, orthopnea and claudication.  Gastrointestinal: Negative for abdominal pain, diarrhea and vomiting.  Musculoskeletal: Positive for joint pain.  Neurological: Negative for tingling, tremors and headaches.  Psychiatric/Behavioral: Negative for depression and hallucinations.   Exam: Physical Exam  HENT:  Nose: No mucosal edema.  Mouth/Throat: No oropharyngeal exudate.  Eyes: Pupils are equal, round, and reactive to light. Conjunctivae and lids are normal.  Neck: Carotid bruit is not present. No thyromegaly present.  Cardiovascular: Regular rhythm, S1 normal, S2 normal and normal heart sounds.  Respiratory: She has no decreased breath sounds. She has no wheezes. She has no rhonchi. She has no rales.  GI: Soft. Bowel sounds are normal. There is no abdominal tenderness.  Musculoskeletal:     Left knee: She exhibits no swelling.     Right ankle: She exhibits no swelling.  Lymphadenopathy:    She has no cervical adenopathy.  Skin: Skin is warm. Nails show no clubbing.  Slight erythema over the skin of the left hip but no breaks in the skin on the left buttock or left  hip area.  She does have a stage II decubitus on the buttock.  Left thigh covered with wound VAC.      Data Reviewed: Basic Metabolic Panel: Recent Labs  Lab 09/05/18 1426 09/07/18 0431 09/08/18 0218 09/10/18 0553  NA 126* 131* 134* 135  K 5.0 4.5 4.4 4.3  CL 89* 101 104 107  CO2 27 24 24 23   GLUCOSE 544* 152* 226* 99  BUN 28* 24* 17 14  CREATININE 0.83 0.64 0.60 0.56  CALCIUM 7.7* 7.6* 7.5* 7.5*  MG  --   --   --  1.8   CBC: Recent Labs  Lab 09/05/18 1141 09/07/18 0431 09/08/18 0218 09/10/18 0553  WBC 18.4* 14.4* 13.8* 10.6*  NEUTROABS 16.7*  --   --   --   HGB 13.3 8.9* 9.2* 8.8*  HCT 42.5 28.6* 29.8* 28.5*  MCV 96.8 97.3 98.0 98.6  PLT 302 188 190 218   BNP (last 3 results) Recent Labs    06/10/18 0621  BNP 811.0*     CBG: Recent Labs  Lab 09/10/18 1112 09/10/18 1130 09/10/18 1307 09/10/18 1332 09/10/18 1443  GLUCAP 62* 126* 72 141* 98    Recent Results (from the past 240 hour(s))  Blood culture (routine x 2)     Status: None   Collection Time: 09/05/18 11:42 AM  Result Value Ref Range Status   Specimen Description BLOOD RFA  Final   Special Requests   Final    BOTTLES DRAWN AEROBIC AND ANAEROBIC Blood Culture adequate volume  Culture   Final    NO GROWTH 5 DAYS Performed at [facility], [address] [place], [place] [address]    Report Status 09/10/2018 FINAL  Final  Blood culture (routine x 2)     Status: Abnormal   Collection Time: 09/05/18 12:24 PM  Result Value Ref Range Status   Specimen Description   Final    BLOOD BLOOD RIGHT HAND Performed at [facility], [address], [place], [place] [address]    Special Requests   Final    BOTTLES DRAWN AEROBIC ONLY Blood Culture adequate volume Performed at [facility], [address] [place], [place] [address]    Culture  Setup Time   Final    GRAM POSITIVE COCCI IN CLUSTERS AEROBIC BOTTLE ONLY CRITICAL RESULT CALLED TO, READ BACK BY AND VERIFIED  WITH: Kevin Fenton PATEL  09/06/18 AKT    Culture (A)  Final    STAPHYLOCOCCUS SPECIES (COAGULASE NEGATIVE) THE SIGNIFICANCE OF ISOLATING THIS ORGANISM FROM A SINGLE SET OF BLOOD CULTURES WHEN MULTIPLE SETS ARE DRAWN IS UNCERTAIN. PLEASE NOTIFY THE MICROBIOLOGY DEPARTMENT WITHIN ONE WEEK IF SPECIATION AND SENSITIVITIES ARE REQUIRED. Performed at [facility], [address] [place], [place] [address]    Report Status 09/08/2018 FINAL  Final  Blood Culture ID Panel (Reflexed)     Status: Abnormal   Collection Time: 09/05/18 12:24 PM  Result Value Ref Range Status   Enterococcus species NOT DETECTED NOT DETECTED Final   Listeria monocytogenes NOT DETECTED NOT DETECTED Final   Staphylococcus species DETECTED (A) NOT DETECTED Final    Comment: Methicillin (oxacillin) resistant coagulase negative staphylococcus. Possible blood culture contaminant (unless isolated from more than one blood culture draw or clinical case suggests pathogenicity). No antibiotic treatment is indicated for blood  culture contaminants. CRITICAL RESULT CALLED TO, READ BACK BY AND VERIFIED WITH: Kevin Fenton PATEL  09/06/18 AKT    Staphylococcus aureus (BCID) NOT DETECTED NOT DETECTED Final   Methicillin resistance DETECTED (A) NOT DETECTED Final    Comment: CRITICAL RESULT CALLED TO, READ BACK BY AND VERIFIED WITH: KISHAN PATEL  09/06/18 AKT    Streptococcus species NOT DETECTED NOT DETECTED Final   Streptococcus agalactiae NOT DETECTED NOT DETECTED Final   Streptococcus pneumoniae NOT DETECTED NOT DETECTED Final   Streptococcus pyogenes NOT DETECTED NOT DETECTED Final   Acinetobacter baumannii NOT DETECTED NOT DETECTED Final   Enterobacteriaceae species NOT DETECTED NOT DETECTED Final   Enterobacter cloacae complex NOT DETECTED NOT DETECTED Final   Escherichia coli NOT DETECTED NOT DETECTED Final   Klebsiella oxytoca NOT DETECTED NOT DETECTED Final   Klebsiella pneumoniae NOT DETECTED NOT DETECTED Final    Proteus species NOT DETECTED NOT DETECTED Final   Serratia marcescens NOT DETECTED NOT DETECTED Final   Haemophilus influenzae NOT DETECTED NOT DETECTED Final   Neisseria meningitidis NOT DETECTED NOT DETECTED Final   Pseudomonas aeruginosa NOT DETECTED NOT DETECTED Final   Candida albicans NOT DETECTED NOT DETECTED Final   Candida glabrata NOT DETECTED NOT DETECTED Final   Candida krusei NOT DETECTED NOT DETECTED Final   Candida parapsilosis NOT DETECTED NOT DETECTED Final   Candida tropicalis NOT DETECTED NOT DETECTED Final    Comment: Performed at [facility], [address] [place], [place] [address]  Aerobic/Anaerobic Culture (surgical/deep wound)     Status: None (Preliminary result)   Collection Time: 09/06/18  1:14 PM  Result Value Ref Range Status   Specimen Description   Final  WOUND GROIN Performed at [facility], [address] [place], [place] [address]    Special Requests   Final    NONE Performed at [facility], [address] [place], [place] [address]    Gram Stain   Final    FEW WBC PRESENT, PREDOMINANTLY PMN MODERATE GRAM POSITIVE COCCI IN PAIRS FEW BUDDING YEAST SEEN FEW GRAM POSITIVE RODS Performed at [facility], [address] [place], [place] [address]    Culture   Final    MODERATE METHICILLIN RESISTANT STAPHYLOCOCCUS AUREUS NO ANAEROBES ISOLATED; CULTURE IN PROGRESS FOR 5 DAYS    Report Status PENDING  Incomplete   Organism ID, Bacteria METHICILLIN RESISTANT STAPHYLOCOCCUS AUREUS  Final      Susceptibility   Methicillin resistant staphylococcus aureus - MIC*    CIPROFLOXACIN >=8 RESISTANT Resistant     ERYTHROMYCIN >=8 RESISTANT Resistant     GENTAMICIN <=0.5 SENSITIVE Sensitive     OXACILLIN >=4 RESISTANT Resistant     TETRACYCLINE <=1 SENSITIVE Sensitive     VANCOMYCIN 1 SENSITIVE Sensitive     TRIMETH/SULFA <=10 SENSITIVE Sensitive     CLINDAMYCIN >=8 RESISTANT Resistant     RIFAMPIN <=0.5  SENSITIVE Sensitive     Inducible Clindamycin NEGATIVE Sensitive     * MODERATE METHICILLIN RESISTANT STAPHYLOCOCCUS AUREUS  Aerobic/Anaerobic Culture (surgical/deep wound)     Status: None (Preliminary result)   Collection Time: 09/07/18  3:08 PM  Result Value Ref Range Status   Specimen Description   Final    WOUND GROIN LEFT GROIN Performed at [facility], [address], [place], [place] [address]    Special Requests   Final    NONE Performed at [facility], [address] [place], [place] [address]    Gram Stain   Final    ABUNDANT WBC PRESENT, PREDOMINANTLY PMN MODERATE GRAM POSITIVE COCCI MODERATE YEAST    Culture   Final    MODERATE VANCOMYCIN RESISTANT ENTEROCOCCUS ISOLATED FEW STAPHYLOCOCCUS AUREUS SUSCEPTIBILITIES TO FOLLOW HOLDING FOR POSSIBLE ANAEROBE Performed at [facility], [address] [place], [place] [address]    Report Status PENDING  Incomplete   Organism ID, Bacteria VANCOMYCIN RESISTANT ENTEROCOCCUS ISOLATED  Final      Susceptibility   Vancomycin resistant enterococcus isolated - MIC*    AMPICILLIN >=32 RESISTANT Resistant     VANCOMYCIN >=32 RESISTANT Resistant     GENTAMICIN SYNERGY SENSITIVE Sensitive     LINEZOLID 2 SENSITIVE Sensitive     * MODERATE VANCOMYCIN RESISTANT ENTEROCOCCUS ISOLATED  Aerobic/Anaerobic Culture (surgical/deep wound)     Status: None (Preliminary result)   Collection Time: 09/07/18  3:09 PM  Result Value Ref Range Status   Specimen Description   Final    WOUND FEMORAL ARTERY LEFT GRAFT Performed at [facility], [address] [place] [address], [place], [place] [address]    Special Requests   Final    NONE Performed at [facility], [address] [place], [place] [address]    Gram Stain   Final    MODERATE WBC PRESENT,BOTH PMN AND MONONUCLEAR NO ORGANISMS SEEN Performed at [facility], [address], [place], [place] [address]    Culture   Final    RARE METHICILLIN  RESISTANT STAPHYLOCOCCUS AUREUS FEW VANCOMYCIN RESISTANT ENTEROCOCCUS ISOLATED NO ANAEROBES ISOLATED; CULTURE IN PROGRESS FOR 5 DAYS    Report Status PENDING  Incomplete   Organism ID, Bacteria METHICILLIN RESISTANT STAPHYLOCOCCUS AUREUS  Final  Organism ID, Bacteria VANCOMYCIN RESISTANT ENTEROCOCCUS ISOLATED  Final      Susceptibility   Methicillin resistant staphylococcus aureus - MIC*    CIPROFLOXACIN >=8 RESISTANT Resistant     ERYTHROMYCIN >=8 RESISTANT Resistant     GENTAMICIN <=0.5 SENSITIVE Sensitive     OXACILLIN >=4 RESISTANT Resistant     TETRACYCLINE <=1 SENSITIVE Sensitive     VANCOMYCIN <=0.5 SENSITIVE Sensitive     TRIMETH/SULFA <=10 SENSITIVE Sensitive     CLINDAMYCIN >=8 RESISTANT Resistant     RIFAMPIN <=0.5 SENSITIVE Sensitive     Inducible Clindamycin NEGATIVE Sensitive     * RARE METHICILLIN RESISTANT STAPHYLOCOCCUS AUREUS   Vancomycin resistant enterococcus isolated - MIC*    AMPICILLIN >=32 RESISTANT Resistant     VANCOMYCIN >=32 RESISTANT Resistant     GENTAMICIN SYNERGY SENSITIVE Sensitive     LINEZOLID 2 SENSITIVE Sensitive     * FEW VANCOMYCIN RESISTANT ENTEROCOCCUS ISOLATED     Studies: No results found.  Scheduled Meds: . aspirin  81 mg Oral Daily  . atorvastatin  40 mg Oral Daily  . collagenase   Topical Daily  . dextrose      . docusate sodium  100 mg Oral Daily  . enoxaparin (LOVENOX) injection  30 mg Subcutaneous Q24H  . ferrous sulfate  325 mg Oral BID WC  . gabapentin  100 mg Oral QHS  . insulin aspart  0-5 Units Subcutaneous QHS  . insulin aspart  0-9 Units Subcutaneous TID WC  . insulin glargine  15 Units Subcutaneous QHS  . linezolid  600 mg Oral Q12H  . midodrine  10 mg Oral TID WC  . mometasone-formoterol  2 puff Inhalation BID  . multivitamin-lutein  1 capsule Oral Daily  . pantoprazole  40 mg Oral Daily  . polyethylene glycol  17 g Oral Daily  . povidone-iodine   Topical BID  . Ensure Max Protein  11 oz Oral BID  . sodium  phosphate  1 enema Rectal Once  . tamsulosin  0.4 mg Oral Daily  . ascorbic acid  500 mg Oral BID   Continuous Infusions: . sodium chloride Stopped (09/09/18 0754)  . sodium chloride    . sodium chloride 40 mL/hr at 09/09/18 1836  . sodium chloride 75 mL/hr at 09/10/18 1505  . magnesium sulfate 1 - 4 g bolus IVPB      Assessment/Plan:  1. Clinical sepsis with left groin infection.  MRSA growing out of wound culture.  On vancomycin.   Outpatient follow-up with vascular surgery in 2 weeks after discharge 2. Left buttock hematoma.  Xarelto on hold.  We will try to resume after wound VAC dressing change in a.m. if clinically stable. surgery signed off 3. Hyponatremia.  Improved with IV fluid 4. Type 2 diabetes mellitus on low-dose glargine and sliding scale 5. Stage II decubiti local wound care 6. Severe malnutrition 7. Chronic hypotension on midodrine 8. Hyperlipidemia unspecified on atorvastatin 9. Diabetic neuropathy on gabapentin 10. GERD on PPI 11. Peripheral vascular disease.  Holding Xarelto at this time.  Left leg amputation site looks good. 12. Pain control will be an issue because she gets decreased respirations once given pain medications.  Try to convert over to Tylenol as quickly as possible.  Trial of tramadol instead.  Code Status:     Code Status Orders  (From admission, onward)         Start     Ordered   09/05/18 1941  Full code  Continuous  09/05/18 1940        Code Status History    Date Active Date Inactive Code Status Order ID Comments User Context   08/21/2018 1658 08/25/2018 1932 DNR [id number]  Adrian Saran, MD ED   08/21/2018 1656 08/21/2018 1658 DNR [id number]  Adrian Saran, MD ED   07/21/2018 1302 07/26/2018 2148 DNR [id number]  Glee Arvin, NP Inpatient   07/09/2018 1730 07/21/2018 1302 Partial Code [id number]  Mayo, Allyn Kenner, MD Inpatient   06/03/2018 0621 06/13/2018 2013 Full Code [id number]  Arnaldo Natal, MD Inpatient   05/23/2018  0253 06/01/2018 2158 Full Code [id number]  Cammy Copa, MD Inpatient   05/23/2018 0138 05/23/2018 0253 DNR [id number]  Cammy Copa, MD ED   04/10/2018 1111 04/11/2018 1554 DNR [id number]  Milagros Loll, MD Inpatient   04/09/2018 1519 04/10/2018 1111 Full Code [id number]  Milagros Loll, MD Inpatient   04/09/2018 1337 04/09/2018 1519 DNR [id number]  Milagros Loll, MD Inpatient   04/09/2018 0012 04/09/2018 1337 Full Code [id number]  Oralia Manis, MD ED     Family Communication: Spoke with daughter yesterday Disposition Plan: To be determined  Consultants:  Vascular surgery  General surgery  Antibiotics:  Vancomycin  Time spent: 32 minutes, case discussed with pharmacist and nursing staff  [facility]  [facility]

## 2018-09-10 NOTE — Progress Notes (Signed)
Patient arrived, very lethargic.  FSBS 62, reported to Dr. Hal Hope and medicated with 25 ML D50.  Recheck after 15 minutes shows fsbs 126, patient more readily responsive and verbal.

## 2018-09-10 NOTE — Progress Notes (Signed)
PT Cancellation Note  Patient Details Name: April [name] MRN: [id number] DOB: 09/09/1955   Cancelled Treatment:    Reason Eval/Treat Not Completed: Patient declined, no reason specified.  Pt sleeping upon PT entering room but woken easily with vc's.  Therapist introduced herself and pt immediately c/o being in too much pain, not sleeping well last night, and felt sick on her stomach (but then pt appeared to fall back asleep).  Pt woken with gentle tactile cues but then pt appeared to fall back asleep quickly again.  Unable to reach pt's nurse; unit secretary notified of pt's reports of pain and feeling sick on stomach and unit secretary reported she would relay information to pt's nurse.  D/t attempting to evaluate pt 3 days in a row, and unable to initiate evaluation, per rehab guidelines will discontinue pt's PT order (discussed with CM and SW).  Please re-consult PT if pt becomes agreeable to PT and appears appropriate for PT order.  Hendricks Limes, PT 09/10/18, 10:17 AM [phone]

## 2018-09-10 NOTE — Anesthesia Postprocedure Evaluation (Signed)
Anesthesia Post Note  Patient: April [name]  Procedure(s) Performed: APPLICATION OF WOUND VAC ( VAC CHANGE ) (Left )  Patient location during evaluation: PACU Anesthesia Type: General Level of consciousness: awake and alert and oriented Pain management: pain level controlled Vital Signs Assessment: post-procedure vital signs reviewed and stable Respiratory status: spontaneous breathing Cardiovascular status: blood pressure returned to baseline Anesthetic complications: no     Last Vitals:  Vitals:   09/10/18 1405 09/10/18 1423  BP: (!) 104/59 (!) 100/56  Pulse: 94 93  Resp: 16 (!) 22  Temp:  36.7 C  SpO2: 100% 100%    Last Pain:  Vitals:   09/10/18 1405  TempSrc:   PainSc: 0-No pain                 Kallyn Demarcus

## 2018-09-10 NOTE — Op Note (Signed)
    OPERATIVE NOTE   PROCEDURE: 1. Irrigation and debridement of skin and soft tissue and muscle for approximately 25 cm with pulse lavage irrigation and negative pressure dressing placement  PRE-OPERATIVE DIAGNOSIS: Nonviable tissue and infection of left femoral incision  POST-OPERATIVE DIAGNOSIS: Same as above  SURGEON: Leotis Pain, MD  ASSISTANT(S): None  ANESTHESIA: MAC  ESTIMATED BLOOD LOSS: 3 cc  FINDING(S): None  SPECIMEN(S): None  INDICATIONS:   April [name] is a 63 y.o. female who presents with infection of her left groin wound.  This had a major surgical debridement 3 days prior.  She is brought to the operating room for her first negative pressure dressing change and debridement is necessary.  Risks and benefits are discussed.  DESCRIPTION: After obtaining full informed written consent, the patient was brought back to the operating room and placed supine upon the operating table.  The patient received IV antibiotics prior to induction.  After obtaining adequate anesthesia, the patient was prepped and draped in the standard fashion.  The wound was then opened and excisional debridement was performed to skin, soft tissue, and muscle to remove all clearly non-viable tissue.  This was mostly fibrinous exudate with no frank purulence seen at this point.  There was a little bit of murky fluid in the deeper tissues and I elected to perform 1 L of pulse lavage saline irrigation.  The tissue was taken back to bleeding tissue that appeared viable.  The debridement was performed with scalpel and scissors and encompassed an area of approximately 25 cm2.  The wound was about 8 to 10 cm in length and about 3 to 4 cm in width.  The wound was irrigated copiously with pulse lavage saline.  After all clearly non-viable tissue was removed, I placed a few interrupted Vicryl sutures to reapproximate some of the deeper tissues as well as to bring together the wound slightly.  This was done with  a series of interrupted 3-0 Vicryl sutures.  A negative pressure dressing was then cut to fit the wound and strips of Ioban were used for a good occlusive seal. The patient was then awakened from anesthesia and taken to the recovery room in stable condition having tolerated the procedure well.  COMPLICATIONS: none  CONDITION: stable  Leotis Pain  09/10/2018, 1:14 PM   This note was created with Dragon Medical transcription system. Any errors in dictation are purely unintentional.

## 2018-09-10 NOTE — Anesthesia Preprocedure Evaluation (Addendum)
Anesthesia Evaluation  Patient identified by MRN, date of birth, ID band Patient awake    Reviewed: Allergy & Precautions, H&P , NPO status , Patient's Chart, lab work & pertinent test results, reviewed documented beta blocker date and time   Airway Mallampati: II  TM Distance: >3 FB Neck ROM: full    Dental  (+) Teeth Intact   Pulmonary COPD, Current Smoker,    Pulmonary exam normal        Cardiovascular Exercise Tolerance: Poor + Peripheral Vascular Disease  Normal cardiovascular exam Rhythm:regular Rate:Normal     Neuro/Psych Anxiety negative neurological ROS  negative psych ROS   GI/Hepatic negative GI ROS, Neg liver ROS,   Endo/Other  negative endocrine ROSdiabetes, Poorly Controlled, Type 1, Insulin Dependent, Oral Hypoglycemic Agents  Renal/GU negative Renal ROS  negative genitourinary   Musculoskeletal   Abdominal   Peds  Hematology  (+) anemia ,   Anesthesia Other Findings Past Medical History: No date: Anxiety No date: Colon polyps No date: COPD (chronic obstructive pulmonary disease) (HCC) No date: Diabetes mellitus without complication (HCC) No date: DVT (deep venous thrombosis) (HCC) No date: DVT (deep venous thrombosis) (HCC) No date: History of colonic polyps No date: Hyperlipidemia No date: Peripheral artery disease (HCC) 08/2012: Pulmonary nodules/lesions, multiple     Comment:  NEGATIVE PET SCAN No date: Retroperitoneal abscess (HCC)     Comment:  history of in 04/2013 No date: Tobacco abuse Past Surgical History: 07/24/2018: AMPUTATION; Left     Comment:  Procedure: AMPUTATION BELOW KNEE;  Surgeon: Annice Needy, MD;  Location: [facility] ORS;  Service: Vascular;                Laterality: Left; 07/11/2018: APPLICATION OF WOUND VAC; Right     Comment:  Procedure: APPLICATION OF WOUND VAC;  Surgeon: Gwyneth Revels, DPM;  Location: [facility] OR[facility];  Service: Podiatry;                 Laterality: Right; No date: CHOLECYSTECTOMY 05/2007: COLONOSCOPY 07/17/2018: ENDARTERECTOMY FEMORAL; Bilateral     Comment:  Procedure: ENDARTERECTOMY FEMORAL - BILATERAL SFA STENT               PLACEMENT - LEFT;  Surgeon: Annice Needy, MD;  Location:               [facility] OR[facility];  Service: Vascular;  Laterality: Bilateral; 05/25/2018: IRRIGATION AND DEBRIDEMENT FOOT; Bilateral     Comment:  Procedure: IRRIGATION AND DEBRIDEMENT FOOT application               wound vac left  foot;  Surgeon: Recardo Evangelist, DPM;                Location: [facility] OR[facility];  Service: Podiatry;  Laterality:               Bilateral; 07/11/2018: IRRIGATION AND DEBRIDEMENT FOOT; Right     Comment:  Procedure: FOOT DEBRIDEMENT;  Surgeon: Gwyneth Revels,               DPM;  Location: [facility] OR[facility];  Service: Podiatry;                Laterality: Right; 05/26/2018: LOWER EXTREMITY ANGIOGRAPHY; Left     Comment:  Procedure: Lower Extremity Angiography with possible  intervention;  Surgeon: Annice Needy, MD;  Location: [facility]              INVASIVE CV LAB;  Service: Cardiovascular;  Laterality:               Left; 05/27/2018: LOWER EXTREMITY ANGIOGRAPHY; Right     Comment:  Procedure: Lower Extremity Angiography;  Surgeon:               Renford Dills, MD;  Location: [facility] INVASIVE CV LAB;               Service: Cardiovascular;  Laterality: Right; 07/15/2018: LOWER EXTREMITY ANGIOGRAPHY; Right     Comment:  Procedure: Lower Extremity Angiography;  Surgeon:               Renford Dills, MD;  Location: [facility] INVASIVE CV LAB;               Service: Cardiovascular;  Laterality: Right; BMI    Body Mass Index:  15.73 kg/m     Reproductive/Obstetrics negative OB ROS                             Anesthesia Physical  Anesthesia Plan  ASA: IV and emergent  Anesthesia Plan:    Post-op Pain Management:    Induction: Intravenous  PONV Risk Score and Plan: Propofol infusion and  TIVA  Airway Management Planned: Nasal Cannula  Additional Equipment:   Intra-op Plan:   Post-operative Plan:   Informed Consent: I have reviewed the patients History and Physical, chart, labs and discussed the procedure including the risks, benefits and alternatives for the proposed anesthesia with the patient or authorized representative who has indicated his/her understanding and acceptance.     Dental Advisory Given  Plan Discussed with: CRNA  Anesthesia Plan Comments:        Anesthesia Quick Evaluation

## 2018-09-11 ENCOUNTER — Encounter: Payer: Self-pay | Admitting: Vascular Surgery

## 2018-09-11 LAB — GLUCOSE, CAPILLARY
GLUCOSE-CAPILLARY: 270 mg/dL — AB (ref 70–99)
GLUCOSE-CAPILLARY: 336 mg/dL — AB (ref 70–99)
Glucose-Capillary: 196 mg/dL — ABNORMAL HIGH (ref 70–99)
Glucose-Capillary: 295 mg/dL — ABNORMAL HIGH (ref 70–99)
Glucose-Capillary: 306 mg/dL — ABNORMAL HIGH (ref 70–99)

## 2018-09-11 LAB — AEROBIC/ANAEROBIC CULTURE W GRAM STAIN (SURGICAL/DEEP WOUND)

## 2018-09-11 LAB — MAGNESIUM: Magnesium: 2.2 mg/dL (ref 1.7–2.4)

## 2018-09-11 MED ORDER — RIVAROXABAN 10 MG PO TABS
10.0000 mg | ORAL_TABLET | Freq: Every day | ORAL | Status: DC
  Administered 2018-09-11 – 2018-09-12 (×2): 10 mg via ORAL
  Filled 2018-09-11 (×3): qty 1

## 2018-09-11 MED ORDER — LINEZOLID 600 MG PO TABS: 600.0000 mg | ORAL_TABLET | Freq: Two times a day (BID) | ORAL | 0 refills | Status: DC

## 2018-09-11 NOTE — Consult Note (Addendum)
Consultation Note Date: 09/11/2018   Patient Name: April KillingsDoris J [name]  DOB: [birth year]  MRN: [id number]  Age / Sex: 63 y.o., female  PCP: Mickel FuchsWroth, Thomas H, MD Referring Physician: Ihor AustinPyreddy, Pavan, MD  Reason for Consultation: Establishing goals of care  HPI/Patient Profile: April DonnaDoris [name]  is a 63 y.o. female with a known history which includes left BKA, status post endarterectomy, who presented to the emergency room left leg pain, drainage from left femoral wound from previous femoral endarterectomy site. She was admitted for sepsis and general  surgery and vascular surgery were consulted.   Clinical Assessment and Goals of Care: Patient is resting in bed. Son has stepped out. Daughter at bedside. Patient states she lives at home with her granddaughter and [place], a roommate. She states her daughter helps her.   She is awaiting a prosthetic, and spends much of her time on the couch or in the bed. She states some days she eats better than others.    We discussed her diagnosis, prognosis, GOC, and options.  A detailed discussion was had today regarding advanced directives.  Concepts specific to code status, artifical feeding and hydration, IV antibiotics and rehospitalization were discussed.  The difference between an aggressive medical intervention path and a comfort care path was discussed.  Values and goals of care important to patient and family were attempted to be elicited.  Discussed limitations of medical interventions to prolong quality of life for this patient at this time in this situation and discussed the concept of human mortality.  She states she is very tired and hates coming to the hospital, she is tired of being "a human [place]", but if that is what she needs to do to live, she will do that. She states she wants home health, and any other interventions needed. Encouraged family conversations  regarding care moving forward including time trails or limits to her care so that her daughter would know how to proceed if the patient was unable to make decisions for herself. Code status was broached and her son suddenly reentered the room. Unfortunately, the conversation was terminated as patient stated on previous visit she did not want son involved in her healthcare.     POA papers filled out and notarized by chaplain today.     SUMMARY OF RECOMMENDATIONS   Recommend palliative to follow at D/C. Will need continued conversations in the future.  Prognosis:   Poor overall.  Discharge Planning: Home with Palliative Services      Primary Diagnoses: Present on Admission:  Sepsis (HCC)   I have reviewed the medical record, interviewed the patient and family, and examined the patient. The following aspects are pertinent.  Past Medical History:  Diagnosis Date   Anxiety    Colon polyps    COPD (chronic obstructive pulmonary disease) (HCC)    Diabetes mellitus without complication (HCC)    DVT (deep venous thrombosis) (HCC)    DVT (deep venous thrombosis) (HCC)    History of colonic polyps    Hyperlipidemia  Peripheral artery disease (HCC)    Pulmonary nodules/lesions, multiple 08/2012   NEGATIVE PET SCAN   Retroperitoneal abscess (HCC)    history of in 04/2013   Tobacco abuse    Social History   Socioeconomic History   Marital status: Divorced    Spouse name: Not on file   Number of children: Not on file   Years of education: Not on file   Highest education level: Not on file  Occupational History   Not on file  Social Needs   Financial resource strain: Not on file   Food insecurity:    Worry: Not on file    Inability: Not on file   Transportation needs:    Medical: Not on file    Non-medical: Not on file  Tobacco Use   Smoking status: Current Every Day Smoker    Packs/day: 1.50    Years: 47.00    Pack years: 70.50    Types:  Cigarettes   Smokeless tobacco: Never Used   Tobacco comment: Smoking History 2.5-started at age 59  Substance and Sexual Activity   Alcohol use: No    Alcohol/week: 0.0 standard drinks   Drug use: No   Sexual activity: Not on file  Lifestyle   Physical activity:    Days per week: Not on file    Minutes per session: Not on file   Stress: Not on file  Relationships   Social connections:    Talks on phone: Not on file    Gets together: Not on file    Attends religious service: Not on file    Active member of club or organization: Not on file    Attends meetings of clubs or organizations: Not on file    Relationship status: Not on file  Other Topics Concern   Not on file  Social History Narrative   Not on file   Family History  Problem Relation Age of Onset   Brain cancer Mother    Scheduled Meds:  aspirin  81 mg Oral Daily   atorvastatin  40 mg Oral Daily   collagenase   Topical Daily   docusate sodium  100 mg Oral Daily   enoxaparin (LOVENOX) injection  30 mg Subcutaneous Q24H   ferrous sulfate  325 mg Oral BID WC   gabapentin  100 mg Oral QHS   insulin aspart  0-5 Units Subcutaneous QHS   insulin aspart  0-9 Units Subcutaneous TID WC   insulin glargine  15 Units Subcutaneous QHS   linezolid  600 mg Oral Q12H   midodrine  10 mg Oral TID WC   mometasone-formoterol  2 puff Inhalation BID   multivitamin-lutein  1 capsule Oral Daily   pantoprazole  40 mg Oral Daily   polyethylene glycol  17 g Oral Daily   povidone-iodine   Topical BID   Ensure Max Protein  11 oz Oral BID   sodium phosphate  1 enema Rectal Once   tamsulosin  0.4 mg Oral Daily   ascorbic acid  500 mg Oral BID   Continuous Infusions:  sodium chloride Stopped (09/09/18 0754)   sodium chloride     sodium chloride 40 mL/hr at 09/09/18 1836   sodium chloride 75 mL/hr at 09/11/18 1220   magnesium sulfate 1 - 4 g bolus IVPB     PRN Meds:.sodium chloride, sodium  chloride, acetaminophen **OR** acetaminophen, ALPRAZolam, alum & mag hydroxide-simeth, guaiFENesin-dextromethorphan, hydrALAZINE, HYDROcodone-acetaminophen, ipratropium-albuterol, ketorolac, labetalol, magnesium sulfate 1 - 4 g bolus IVPB, naLOXone (  NARCAN)  injection, ondansetron **OR** [DISCONTINUED] ondansetron (ZOFRAN) IV, phenol, polyethylene glycol, potassium chloride Medications Prior to Admission:  Prior to Admission medications   Medication Sig Start Date End Date Taking? Authorizing Provider  ALPRAZolam Prudy Feeler) 0.5 MG tablet Take 1 tablet (0.5 mg total) by mouth 2 (two) times daily as needed for anxiety. 07/26/18  Yes Adrian Saran, MD  aspirin 81 MG chewable tablet Chew 81 mg by mouth daily. 05/30/17  Yes [provider]  atorvastatin (LIPITOR) 40 MG tablet Take 40 mg by mouth daily. 10/24/17 10/24/18 Yes [provider]  Fluticasone-Salmeterol (ADVAIR DISKUS) 250-50 MCG/DOSE AEPB Inhale 1 puff into the lungs 2 (two) times daily. 03/14/15 01/16/26 Yes Beers, Charmayne Sheer, PA-C  gabapentin (NEURONTIN) 100 MG capsule Take 1 capsule (100 mg total) by mouth at bedtime. 07/26/18  Yes Mody, Patricia Pesa, MD  insulin aspart (NOVOLOG) 100 UNIT/ML injection Inject 3 Units into the skin 3 (three) times daily with meals. 07/26/18  Yes Mody, Patricia Pesa, MD  insulin glargine (LANTUS) 100 UNIT/ML injection Inject 0.15 mLs (15 Units total) into the skin at bedtime. 07/26/18  Yes Adrian Saran, MD  metFORMIN (GLUCOPHAGE) 500 MG tablet Take 500 mg by mouth daily with breakfast.  06/13/18  Yes [provider]  pantoprazole (PROTONIX) 40 MG tablet Take 40 mg by mouth 2 (two) times daily.  04/25/18  Yes [provider]  potassium chloride (K-DUR,KLOR-CON) 10 MEQ tablet Take by mouth. 06/13/18  Yes [provider]  rivaroxaban (XARELTO) 10 MG TABS tablet Take 10 mg by mouth every evening.   Yes [provider]  SPIRIVA HANDIHALER 18 MCG inhalation capsule Place 18 mcg into inhaler and  inhale daily.  05/19/18  Yes [provider]  tamsulosin (FLOMAX) 0.4 MG CAPS capsule Take 1 capsule (0.4 mg total) by mouth daily. 06/14/18  Yes Altamese Dilling, MD  vitamin C (VITAMIN C) 500 MG tablet Take 1 tablet (500 mg total) by mouth 2 (two) times daily. 06/13/18  Yes Altamese Dilling, MD  albuterol (PROVENTIL HFA;VENTOLIN HFA) 108 (90 Base) MCG/ACT inhaler Inhale 2 puffs into the lungs every 6 (six) hours as needed for wheezing or shortness of breath.    [provider]  albuterol (PROVENTIL) (2.5 MG/3ML) 0.083% nebulizer solution Inhale 2.5 mg into the lungs every 6 (six) hours as needed for wheezing or shortness of breath.     [provider]  collagenase (SANTYL) ointment Apply topically daily. 08/25/18   Auburn Bilberry, MD  ferrous sulfate 325 (65 FE) MG tablet Take 1 tablet (325 mg total) by mouth 2 (two) times daily with a meal. 06/01/18 07/01/18  Ihor Austin, MD  furosemide (LASIX) 40 MG tablet Take 1 tablet (40 mg total) by mouth daily. 06/14/18   Altamese Dilling, MD  guaiFENesin-dextromethorphan (ROBITUSSIN DM) 100-10 MG/5ML syrup Take 5 mLs by mouth every 4 (four) hours as needed for cough. 04/11/18   Sudini, Wardell Heath, MD  ipratropium-albuterol (DUONEB) 0.5-2.5 (3) MG/3ML SOLN Take 3 mLs by nebulization 3 (three) times daily. 08/25/18   Auburn Bilberry, MD  midodrine (PROAMATINE) 10 MG tablet Take 1 tablet (10 mg total) by mouth 3 (three) times daily with meals. 07/26/18   Adrian Saran, MD  polyethylene glycol (MIRALAX / GLYCOLAX) packet Take 17 g by mouth daily. 06/14/18   Altamese Dilling, MD  protein supplement shake (PREMIER PROTEIN) LIQD Take 325 mLs (11 oz total) by mouth 2 (two) times daily between meals. 06/14/18   Altamese Dilling, MD   Allergies  Allergen Reactions  Eggs Or Egg-Derived Products Diarrhea, Itching and Nausea And Vomiting   Review of Systems  Constitutional: Positive for fatigue.    Physical  Exam Pulmonary:     Effort: Pulmonary effort is normal.  Neurological:     Mental Status: She is alert.     Comments: Oriented     Vital Signs: BP (!) 91/52    Pulse 91    Temp 98 F (36.7 C) (Oral)    Resp 18    Ht 5\' 2"  (1.575 m)    Wt 39 kg    SpO2 98%    BMI 15.73 kg/m  Pain Scale: 0-10 POSS *See Group Information*: 1-Acceptable,Awake and alert Pain Score: 10-Worst pain ever   SpO2: SpO2: 98 % O2 Device:SpO2: 98 % O2 Flow Rate: .O2 Flow Rate (L/min): 6 L/min  IO: Intake/output summary:   Intake/Output Summary (Last 24 hours) at 09/11/2018 1232 Last data filed at 09/11/2018 5183 Gross per 24 hour  Intake 1525.72 ml  Output 880 ml  Net 645.72 ml    LBM: Last BM Date: 08/31/18 Baseline Weight: Weight: 39 kg Most recent weight: Weight: 39 kg     Palliative Assessment/Data:   Flowsheet Rows     Most Recent Value  Intake Tab  Referral Department  Hospitalist  Unit at Time of Referral  Med/Surg Unit  Date Notified  09/08/18  Palliative Care Type  Return patient Palliative Care  Reason for referral  Clarify Goals of Care  Date of Admission  09/05/18  # of days IP prior to Palliative referral  3  Clinical Assessment  Psychosocial & Spiritual Assessment  Palliative Care Outcomes      Time In: 10:50 Time Out: 11:30 Time Total: 40 min Greater than 50%  of this time was spent counseling and coordinating care related to the above assessment and plan.  Signed by: Morton Stall, NP   Please contact Palliative Medicine Team phone at [phone] for questions and concerns.  For individual provider: See Loretha Stapler

## 2018-09-11 NOTE — Care Management Note (Addendum)
Case Management Note  Patient Details  Name: April [name] MRN: [id number] Date of Birth: [birth year]   Plan remains for patient to discharge home with wound vac in place.  [employer] to deliver home vac prior to discharge.   Outpatient palliative referral made to [facility] with [facility].    Pharmacy has checked outpatient cost for $1.30 for linezolid.   Subjective/Objective:                    Action/Plan:   Expected Discharge Date:                  Expected Discharge Plan:  Home w Home Health Services  In-House Referral:     Discharge planning Services  CM Consult  Post Acute Care Choice:  Home Health, Resumption of Svcs/PTA Provider Choice offered to:     DME Arranged:  Vac DME Agency:  [facility]  HH Arranged:  RN, PT, Social Work, Nurse's Aide HH Agency:  [facility]  Status of Service:  In process, will continue to follow  If discussed at Long Length of Stay Meetings, dates discussed:    Additional Comments:  Chapman Fitch, RN 09/11/2018, 3:44 PM

## 2018-09-11 NOTE — Progress Notes (Signed)
Occupational Therapy Treatment Patient Details Name: April [name] MRN: [id number] DOB: 01/05/1956 Today's Date: 09/11/2018    History of present illness Pt is a 63 y.o. female s/p  Left groin wound exploration, sharp debridement, & removal of infected femoral enarterectomy patch. Pt presented to [facility] 09/07/18 with AMS status complaining of pain in groin and buttocks. PMH includes DM, COPD, recent L LE BKA (07/24/18), DVT, and acute osteomyelitis R foot..  I & D on 3/11 under MAC and no change in function so no re-eval needed and continued orders in place.   OT comments  Pt seen for assessing for re-eval needs since she had I &D on 3/11 under MAC (continue OT orders in place) but pt is at same level of function and no need for re-eval.  She was seen for self care training and was alert and sitting up in bed.  She has 10/10 pain in groin which limits bed mobility and needs max assist and cues but has progressed to only set up for grooming skills in bed.  She was alert and cooperative and stated she wants more therapy to be able to regain strength again so her family does not have to assist her as much.  Kristen from PT updated about status change.  Good participation and assisted with sitting up with support from pillows to eat late lunch tray that was brought to room.  Rec wrapping to LLE stump when medically cleared to ensure good stump shaping for future prosthetic limb.      Follow Up Recommendations  SNF;Supervision/Assistance - 24 hour    Equipment Recommendations       Recommendations for Other Services      Precautions / Restrictions Precautions Precautions: Fall Precaution Comments: Prevalon boot R LE Restrictions Weight Bearing Restrictions: Yes RLE Weight Bearing: Non weight bearing Other Position/Activity Restrictions: L LE BKA 07/24/18; pt reports NWB R LE d/t R foot wounds       Mobility Bed Mobility                  Transfers                       Balance                                           ADL either performed or assessed with clinical judgement   ADL Overall ADL's : Needs assistance/impaired Eating/Feeding: Set up;Supervision/ safety   Grooming: Wash/dry hands;Wash/dry face;Oral care;Set up;Bed level Grooming Details (indicate cue type and reason): pt has progressed to only needing supervision and set up for grooming skills rinsing mouth with swab (no teeth) and completing hair combing and washing face Upper Body Bathing: Moderate assistance;Set up Upper Body Bathing Details (indicate cue type and reason): pt is more alert and wanting to complete own bathing for upper body and part of LB this session with mod assist and cues Lower Body Bathing: Moderate assistance;Set up           Toilet Transfer: Maximal assistance;Set up Toilet Transfer Details (indicate cue type and reason): pt with pain 10/10 from groin surgery and needs max assist and cues---also has decub in L lower back           General ADL Comments: Pt is more alert and aware of being in hospital now and that she had surgery  for groin and has L BKA.  Pt was receiving a lot of help at home from family and has tarnsfer tub bench for bathing with assist.     Vision       Perception     Praxis      Cognition Arousal/Alertness: Awake/alert Behavior During Therapy: Flat affect Overall Cognitive Status: No family/caregiver present to determine baseline cognitive functioning                                 General Comments: pt more alert and wanting to participate in therapy and Tera Helper updated about PT starting again tomorrow.        Exercises     Shoulder Instructions       General Comments      Pertinent Vitals/ Pain       Pain Assessment: Faces Pain Score: 10-Worst pain ever Pain Location: groin Pain Descriptors / Indicators: Grimacing;Operative site guarding;Constant Pain Intervention(s): Limited  activity within patient's tolerance;Monitored during session;Premedicated before session;Utilized relaxation techniques;Repositioned  Home Living                                          Prior Functioning/Environment              Frequency  Min 2X/week        Progress Toward Goals  OT Goals(current goals can now be found in the care plan section)  Progress towards OT goals: Progressing toward goals  Acute Rehab OT Goals Patient Stated Goal: to domore for myself again OT Goal Formulation: With patient Time For Goal Achievement: 09/22/18 Potential to Achieve Goals: [place] Discharge plan remains appropriate    Co-evaluation                 AM-PAC OT "6 Clicks" Daily Activity     Outcome Measure   Help from another person eating meals?: A Little Help from another person taking care of personal grooming?: None Help from another person toileting, which includes using toliet, bedpan, or urinal?: A Lot Help from another person bathing (including washing, rinsing, drying)?: A Lot Help from another person to put on and taking off regular upper body clothing?: A Little Help from another person to put on and taking off regular lower body clothing?: A Lot 6 Click Score: 16    End of Session    OT Visit Diagnosis: Other abnormalities of gait and mobility (R26.89);History of falling (Z91.81);Pain Pain - part of body: (groin)   Activity Tolerance Patient limited by pain   Patient Left in bed;with bed alarm set;with SCD's reapplied;with call bell/phone within reach   Nurse Communication          Time: 1340-1420 OT Time Calculation (min): 40 min  Charges: OT General Charges $OT Visit: 1 Visit OT Treatments $Self Care/Home Management : 38-52 mins  Chrys Racer, OTR/L ascom [phone] 09/11/18, 2:32 PM

## 2018-09-11 NOTE — Progress Notes (Signed)
Patient ID: April [name], female   DOB: Aug 06, 1955, 63 y.o.   MRN: [id number]  [employer] PROGRESS NOTE  April [name]:[id number] DOB: [birth year] DOA: 09/05/2018 PCP: Mickel Fuchs, MD  HPI/Subjective: Patient seen today For low blood pressure this morning On oral midodrine No complaints of chest pain No shortness of breath  Objective: Vitals:   09/11/18 0804 09/11/18 1224  BP: (!) 78/43 (!) 91/52  Pulse: 96 91  Resp:    Temp: 98 F (36.7 C)   SpO2: 97% 98%    Filed Weights   09/05/18 1056 09/05/18 1957  Weight: 39 kg 39 kg    ROS: Review of Systems  Constitutional: Negative for diaphoresis.  HENT: Negative for congestion, ear discharge, ear pain, nosebleeds and sore throat.   Eyes: Negative for blurred vision, pain and discharge.  Respiratory: Negative for shortness of breath.   Cardiovascular: Negative for chest pain, orthopnea and claudication.  Gastrointestinal: Negative for abdominal pain, diarrhea and vomiting.  Musculoskeletal: Positive for joint pain.  Neurological: Negative for tingling, tremors and headaches.  Psychiatric/Behavioral: Negative for depression and hallucinations.   Exam: Physical Exam  HENT:  Nose: No mucosal edema.  Mouth/Throat: No oropharyngeal exudate.  Eyes: Pupils are equal, round, and reactive to light. Conjunctivae and lids are normal.  Neck: Carotid bruit is not present. No thyromegaly present.  Cardiovascular: Regular rhythm, S1 normal, S2 normal and normal heart sounds.  Respiratory: She has no decreased breath sounds. She has no wheezes. She has no rhonchi. She has no rales.  GI: Soft. Bowel sounds are normal. There is no abdominal tenderness.  Musculoskeletal:     Left knee: She exhibits no swelling.     Right ankle: She exhibits no swelling.  Lymphadenopathy:    She has no cervical adenopathy.  Skin: Skin is warm. Nails show no clubbing.  Slight erythema over the skin of the left hip but no breaks in the  skin on the left buttock or left hip area.  She does have a stage II decubitus on the buttock.  Left thigh covered with wound VAC.      Data Reviewed: Basic Metabolic Panel: Recent Labs  Lab 09/05/18 1426 09/07/18 0431 09/08/18 0218 09/10/18 0553  NA 126* 131* 134* 135  K 5.0 4.5 4.4 4.3  CL 89* 101 104 107  CO2 GLUCOSE 544* 152* 226* 99  BUN 28* 24* 17 14  CREATININE 0.83 0.64 0.60 0.56  CALCIUM 7.7* 7.6* 7.5* 7.5*  MG  --   --   --  1.8   CBC: Recent Labs  Lab 09/05/18 1141 09/07/18 0431 09/08/18 0218 09/10/18 0553  WBC 18.4* 14.4* 13.8* 10.6*  NEUTROABS 16.7*  --   --   --   HGB 13.3 8.9* 9.2* 8.8*  HCT 42.5 28.6* 29.8* 28.5*  MCV 96.8 97.3 98.0 98.6  PLT 302 188 190 218   BNP (last 3 results) Recent Labs    06/10/18 0621  BNP 811.0*     CBG: Recent Labs  Lab 09/10/18 1655 09/10/18 1718 09/10/18 2152 09/11/18 0800 09/11/18 1204  GLUCAP 73 77 93 196* 270*    Recent Results (from the past 240 hour(s))  Blood culture (routine x 2)     Status: None   Collection Time: 09/05/18 11:42 AM  Result Value Ref Range Status   Specimen Description BLOOD RFA  Final   Special Requests   Final    BOTTLES DRAWN AEROBIC AND ANAEROBIC  Blood Culture adequate volume   Culture   Final    NO GROWTH 5 DAYS Performed at [facility], [address] [place], [place] [address]    Report Status 09/10/2018 FINAL  Final  Blood culture (routine x 2)     Status: Abnormal   Collection Time: 09/05/18 12:24 PM  Result Value Ref Range Status   Specimen Description   Final    BLOOD BLOOD RIGHT HAND Performed at [facility], [address] [place], [place] [address]    Special Requests   Final    BOTTLES DRAWN AEROBIC ONLY Blood Culture adequate volume Performed at [facility], [address] [place], [place] [address]    Culture  Setup Time   Final    GRAM POSITIVE COCCI IN CLUSTERS AEROBIC BOTTLE ONLY CRITICAL RESULT  CALLED TO, READ BACK BY AND VERIFIED WITH: Kevin Fenton PATEL @1616  09/06/18 AKT    Culture (A)  Final    STAPHYLOCOCCUS SPECIES (COAGULASE NEGATIVE) THE SIGNIFICANCE OF ISOLATING THIS ORGANISM FROM A SINGLE SET OF BLOOD CULTURES WHEN MULTIPLE SETS ARE DRAWN IS UNCERTAIN. PLEASE NOTIFY THE MICROBIOLOGY DEPARTMENT WITHIN ONE WEEK IF SPECIATION AND SENSITIVITIES ARE REQUIRED. Performed at [facility], [address], [place], [place] [address]    Report Status 09/08/2018 FINAL  Final  Blood Culture ID Panel (Reflexed)     Status: Abnormal   Collection Time: 09/05/18 12:24 PM  Result Value Ref Range Status   Enterococcus species NOT DETECTED NOT DETECTED Final   Listeria monocytogenes NOT DETECTED NOT DETECTED Final   Staphylococcus species DETECTED (A) NOT DETECTED Final    Comment: Methicillin (oxacillin) resistant coagulase negative staphylococcus. Possible blood culture contaminant (unless isolated from more than one blood culture draw or clinical case suggests pathogenicity). No antibiotic treatment is indicated for blood  culture contaminants. CRITICAL RESULT CALLED TO, READ BACK BY AND VERIFIED WITH: Kevin Fenton PATEL @1616  09/06/18 AKT    Staphylococcus aureus (BCID) NOT DETECTED NOT DETECTED Final   Methicillin resistance DETECTED (A) NOT DETECTED Final    Comment: CRITICAL RESULT CALLED TO, READ BACK BY AND VERIFIED WITH: KISHAN PATEL @1616  09/06/18 AKT    Streptococcus species NOT DETECTED NOT DETECTED Final   Streptococcus agalactiae NOT DETECTED NOT DETECTED Final   Streptococcus pneumoniae NOT DETECTED NOT DETECTED Final   Streptococcus pyogenes NOT DETECTED NOT DETECTED Final   Acinetobacter baumannii NOT DETECTED NOT DETECTED Final   Enterobacteriaceae species NOT DETECTED NOT DETECTED Final   Enterobacter cloacae complex NOT DETECTED NOT DETECTED Final   Escherichia coli NOT DETECTED NOT DETECTED Final   Klebsiella oxytoca NOT DETECTED NOT DETECTED Final   Klebsiella pneumoniae  NOT DETECTED NOT DETECTED Final   Proteus species NOT DETECTED NOT DETECTED Final   Serratia marcescens NOT DETECTED NOT DETECTED Final   Haemophilus influenzae NOT DETECTED NOT DETECTED Final   Neisseria meningitidis NOT DETECTED NOT DETECTED Final   Pseudomonas aeruginosa NOT DETECTED NOT DETECTED Final   Candida albicans NOT DETECTED NOT DETECTED Final   Candida glabrata NOT DETECTED NOT DETECTED Final   Candida krusei NOT DETECTED NOT DETECTED Final   Candida parapsilosis NOT DETECTED NOT DETECTED Final   Candida tropicalis NOT DETECTED NOT DETECTED Final    Comment: Performed at [facility], [address] [place], [place] [address]  Aerobic/Anaerobic Culture (surgical/deep wound)     Status: None   Collection Time: 09/06/18  1:14 PM  Result Value Ref Range Status   Specimen Description  Final    WOUND GROIN Performed at [facility], [address] [place], [place] [address]    Special Requests   Final    NONE Performed at [facility], [address] [place], [place] [address]    Gram Stain   Final    FEW WBC PRESENT, PREDOMINANTLY PMN MODERATE GRAM POSITIVE COCCI IN PAIRS FEW BUDDING YEAST SEEN FEW GRAM POSITIVE RODS    Culture   Final    MODERATE METHICILLIN RESISTANT STAPHYLOCOCCUS AUREUS NO ANAEROBES ISOLATED Performed at [facility], [address] [place], [place] [address]    Report Status 09/11/2018 FINAL  Final   Organism ID, Bacteria METHICILLIN RESISTANT STAPHYLOCOCCUS AUREUS  Final      Susceptibility   Methicillin resistant staphylococcus aureus - MIC*    CIPROFLOXACIN >=8 RESISTANT Resistant     ERYTHROMYCIN >=8 RESISTANT Resistant     GENTAMICIN <=0.5 SENSITIVE Sensitive     OXACILLIN >=4 RESISTANT Resistant     TETRACYCLINE <=1 SENSITIVE Sensitive     VANCOMYCIN 1 SENSITIVE Sensitive     TRIMETH/SULFA <=10 SENSITIVE Sensitive     CLINDAMYCIN >=8 RESISTANT Resistant     RIFAMPIN <=0.5 SENSITIVE Sensitive      Inducible Clindamycin NEGATIVE Sensitive     * MODERATE METHICILLIN RESISTANT STAPHYLOCOCCUS AUREUS  Aerobic/Anaerobic Culture (surgical/deep wound)     Status: None (Preliminary result)   Collection Time: 09/07/18  3:08 PM  Result Value Ref Range Status   Specimen Description   Final    WOUND GROIN LEFT GROIN Performed at [facility], [address] [place], [place] [address]    Special Requests   Final    NONE Performed at [facility], [address] [place], [place] [address]    Gram Stain   Final    ABUNDANT WBC PRESENT, PREDOMINANTLY PMN MODERATE GRAM POSITIVE COCCI MODERATE YEAST Performed at [facility], [address] [place], [place] [address]    Culture   Final    MODERATE ENTEROCOCCUS FAECIUM FEW METHICILLIN RESISTANT STAPHYLOCOCCUS AUREUS NO ANAEROBES ISOLATED; CULTURE IN PROGRESS FOR 5 DAYS    Report Status PENDING  Incomplete   Organism ID, Bacteria ENTEROCOCCUS FAECIUM  Final   Organism ID, Bacteria METHICILLIN RESISTANT STAPHYLOCOCCUS AUREUS  Final      Susceptibility   Enterococcus faecium - MIC*    AMPICILLIN >=32 RESISTANT Resistant     VANCOMYCIN >=32 RESISTANT Resistant     GENTAMICIN SYNERGY SENSITIVE Sensitive     LINEZOLID 2 SENSITIVE Sensitive     * MODERATE ENTEROCOCCUS FAECIUM   Methicillin resistant staphylococcus aureus - MIC*    CIPROFLOXACIN >=8 RESISTANT Resistant     ERYTHROMYCIN >=8 RESISTANT Resistant     GENTAMICIN <=0.5 SENSITIVE Sensitive     OXACILLIN >=4 RESISTANT Resistant     TETRACYCLINE <=1 SENSITIVE Sensitive     VANCOMYCIN 1 SENSITIVE Sensitive     TRIMETH/SULFA <=10 SENSITIVE Sensitive     CLINDAMYCIN <=0.25 SENSITIVE Sensitive     RIFAMPIN <=0.5 SENSITIVE Sensitive     Inducible Clindamycin NEGATIVE Sensitive     * FEW METHICILLIN RESISTANT STAPHYLOCOCCUS AUREUS  Aerobic/Anaerobic Culture (surgical/deep wound)     Status: None (Preliminary result)   Collection Time: 09/07/18  3:09 PM   Result Value Ref Range Status   Specimen Description   Final    WOUND FEMORAL ARTERY LEFT GRAFT Performed at [facility], [address] [place], [place] [address]  Special Requests   Final    NONE Performed at [facility], [address] [place], [place] [address]    Gram Stain   Final    MODERATE WBC PRESENT,BOTH PMN AND MONONUCLEAR NO ORGANISMS SEEN Performed at [facility], [address], [place], [place] [address]    Culture   Final    RARE METHICILLIN RESISTANT STAPHYLOCOCCUS AUREUS FEW VANCOMYCIN RESISTANT ENTEROCOCCUS ISOLATED NO ANAEROBES ISOLATED; CULTURE IN PROGRESS FOR 5 DAYS    Report Status PENDING  Incomplete   Organism ID, Bacteria METHICILLIN RESISTANT STAPHYLOCOCCUS AUREUS  Final   Organism ID, Bacteria VANCOMYCIN RESISTANT ENTEROCOCCUS ISOLATED  Final      Susceptibility   Methicillin resistant staphylococcus aureus - MIC*    CIPROFLOXACIN >=8 RESISTANT Resistant     ERYTHROMYCIN >=8 RESISTANT Resistant     GENTAMICIN <=0.5 SENSITIVE Sensitive     OXACILLIN >=4 RESISTANT Resistant     TETRACYCLINE <=1 SENSITIVE Sensitive     VANCOMYCIN <=0.5 SENSITIVE Sensitive     TRIMETH/SULFA <=10 SENSITIVE Sensitive     CLINDAMYCIN >=8 RESISTANT Resistant     RIFAMPIN <=0.5 SENSITIVE Sensitive     Inducible Clindamycin NEGATIVE Sensitive     * RARE METHICILLIN RESISTANT STAPHYLOCOCCUS AUREUS   Vancomycin resistant enterococcus isolated - MIC*    AMPICILLIN >=32 RESISTANT Resistant     VANCOMYCIN >=32 RESISTANT Resistant     GENTAMICIN SYNERGY SENSITIVE Sensitive     LINEZOLID 2 SENSITIVE Sensitive     * FEW VANCOMYCIN RESISTANT ENTEROCOCCUS ISOLATED     Studies: No results found.  Scheduled Meds: . aspirin  81 mg Oral Daily  . atorvastatin  40 mg Oral Daily  . collagenase   Topical Daily  . docusate sodium  100 mg Oral Daily  . enoxaparin (LOVENOX) injection  30 mg Subcutaneous Q24H  . ferrous sulfate  325 mg Oral BID WC  .  gabapentin  100 mg Oral QHS  . insulin aspart  0-5 Units Subcutaneous QHS  . insulin aspart  0-9 Units Subcutaneous TID WC  . insulin glargine  15 Units Subcutaneous QHS  . linezolid  600 mg Oral Q12H  . midodrine  10 mg Oral TID WC  . mometasone-formoterol  2 puff Inhalation BID  . multivitamin-lutein  1 capsule Oral Daily  . pantoprazole  40 mg Oral Daily  . polyethylene glycol  17 g Oral Daily  . povidone-iodine   Topical BID  . Ensure Max Protein  11 oz Oral BID  . sodium phosphate  1 enema Rectal Once  . tamsulosin  0.4 mg Oral Daily  . ascorbic acid  500 mg Oral BID   Continuous Infusions: . sodium chloride Stopped (09/09/18 0754)  . sodium chloride    . sodium chloride 40 mL/hr at 09/09/18 1836  . sodium chloride 75 mL/hr at 09/11/18 1220  . magnesium sulfate 1 - 4 g bolus IVPB      Assessment/Plan:  1. Clinical sepsis with left groin infection.  MRSA growing out of wound culture.  On vancomycin.  Blood culture results with vancomycin-resistant enterococcus and Staphylococcus aureus.  Vancomycin discontinued patient started on Zyvox ID consult placed blood culture likely skin contamination.  Wound VAC dressing change in OR by vascular surgery today is need to be continued twice a week after discharge.  Outpatient follow-up with vascular surgery in 2 weeks after discharge.  As per infectious disease attending patient will need oral Zyvox for 2 weeks.  Will discuss with  case management regarding setting up home health services and wound VAC services. 2. Left buttock hematoma.  Xarelto on hold.  We will try to resume in a.m. if clinically stable. surgery signed off 3. Hyponatremia.  Improved with IV fluid 4. Type 2 diabetes mellitus on low-dose glargine and sliding scale 5. Stage II decubiti local wound care 6. Severe malnutrition 7. Chronic hypotension on midodrine 8. Hyperlipidemia unspecified on atorvastatin 9. Diabetic neuropathy on gabapentin 10. GERD on  PPI 11. Peripheral vascular disease.  Holding Xarelto at this time.  Left leg amputation site looks good. 12. Pain control will be an issue because she gets decreased respirations once given pain medications.  Try to convert over to Tylenol as quickly as possible.  Trial of tramadol instead.  Code Status: Full code    Code Status Orders  (From admission, onward)         Start     Ordered   09/05/18 1941  Full code  Continuous     09/05/18 1940        Code Status History    Date Active Date Inactive Code Status Order ID Comments User Context   08/21/2018 1658 08/25/2018 1932 DNR [id number]  Adrian Saran, MD ED   08/21/2018 1656 08/21/2018 1658 DNR [id number]  Adrian Saran, MD ED   07/21/2018 1302 07/26/2018 2148 DNR [id number]  Glee Arvin, NP Inpatient   07/09/2018 1730 07/21/2018 1302 Partial Code [id number]  Mayo, Allyn Kenner, MD Inpatient   06/03/2018 0621 06/13/2018 2013 Full Code [id number]  Arnaldo Natal, MD Inpatient   05/23/2018 0253 06/01/2018 2158 Full Code [id number]  Cammy Copa, MD Inpatient   05/23/2018 0138 05/23/2018 0253 DNR [id number]  Cammy Copa, MD ED   04/10/2018 1111 04/11/2018 1554 DNR [id number]  Milagros Loll, MD Inpatient   04/09/2018 1519 04/10/2018 1111 Full Code [id number]  Milagros Loll, MD Inpatient   04/09/2018 1337 04/09/2018 1519 DNR [id number]  Milagros Loll, MD Inpatient   04/09/2018 0012 04/09/2018 1337 Full Code [id number]  Oralia Manis, MD ED     Family Communication: Spoke with daughter yesterday Disposition Plan: To be determined  Consultants:  Vascular surgery  General surgery  Antibiotics:  Vancomycin  Time spent: 34  minutes, case discussed with pharmacist and nursing staff  Ihor Austin  [employer]

## 2018-09-11 NOTE — Progress Notes (Signed)
Inpatient Diabetes Program Recommendations  AACE/ADA: New Consensus Statement on Inpatient Glycemic Control  Target Ranges:  Prepandial:   less than 140 mg/dL      Peak postprandial:   less than 180 mg/dL (1-2 hours)      Critically ill patients:  140 - 180 mg/dL   Results for April [name], April [name] (MRN [id number]) as of 09/11/2018 12:16  Ref. Range 09/10/2018 07:36 09/10/2018 11:12 09/10/2018 11:30 09/10/2018 13:07 09/10/2018 13:32 09/10/2018 14:43 09/10/2018 16:55 09/10/2018 17:18 09/10/2018 21:52 09/11/2018 08:00 09/11/2018 12:04  Glucose-Capillary Latest Ref Range: 70 - 99 mg/dL 86 62 (L) 242 (H) 72 353 (H) 98 73 77 93 196 (H) 270 (H)   Review of Glycemic Control  Current orders for Inpatient glycemic control: Lantus 15 units QHS, Novolog 0-9 units TID with meals, Novolog 0-5 units QHS  Inpatient Diabetes Program Recommendations:  Insulin - Basal: In reviewing chart, noted Lantus was NOT GIVEN last night. As a result fasting glucose 196 mg/dl and noon CBG 614 mg/dl. Noted patient had hypoglycemia on 09/10/18. Please consider decreasing Lantus to 10 units QHS.  Thanks, Orlando Penner, RN, MSN, CDE Diabetes Coordinator Inpatient Diabetes Program [phone] (Team Pager from 8am to 5pm)

## 2018-09-11 NOTE — Progress Notes (Signed)
Nutrition Follow Up Note   DOCUMENTATION CODES:   Severe malnutrition in context of chronic illness, Underweight  INTERVENTION:   Ensure Max Protein po BID, each supplement provides 150 kcal and 30 grams of protein.  Magic cup TID with meals, each supplement provides 290 kcal and 9 grams of protein  Provide Ocuvite daily for wound healing (provides zinc, vitamin A, vitamin C, Vitamin E, copper, and selenium).  Provide vitamin C 500 mg po BID.  Bowel regimen per MD   NUTRITION DIAGNOSIS:   Severe Malnutrition related to chronic illness(COPD, poorly controlled DM, increased nutrient needs from stage III pressure ulcer) as evidenced by severe fat depletion, severe muscle depletion.  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   Pt with continued poor appetite and oral intake; pt eating <25% of meals. Pt is drinking some Ensure. RD will add Magic Cups to meal trays. Consider appetite stimulant. Palliative care following and family meeting scheduled for today. Pt would likely need NGT placement and nutrition support to support wound healing and help meet her estimated needs; this may not be in line with pt's goals of care. Pt likely at high refeed risk. No new weight since 3/6; will request new weight. Type 1 BM noted today; recommend bowel regimen per MD.   Medications reviewed and include: aspirin, colace, lovenox, ferrous sulfate, insulin, ocuvite, protonix, miralax, NaCl @75ml /hr  Labs reviewed: K 4.3 wnl, Mg 1.8 wnl Wbc- 10.6(H), Hgb 8.8(L), Hct 28.5(L)  Diet Order:   Diet Order            Diet regular Room  service appropriate? Yes; Fluid consistency: Thin  Diet effective now             EDUCATION NEEDS:   Not appropriate for education at this time  Skin:  Skin Assessment: Skin Integrity Issues:(infected left groin incision site; stage III to sacrum)  Last BM:  3/12- type 1  Height:   Ht Readings from Last 1 Encounters:  09/05/18 5' 2"  (1.575 m)   Weight:   Wt Readings from Last 1 Encounters:  09/05/18 39 kg   Ideal Body Weight:  47 kg(adjusted for BKA)  BMI:  Body mass index is 15.73 kg/m.  Estimated Nutritional Needs:   Kcal:  1200-1400  Protein:  60-70 grams  Fluid:  1.2-1.4 L/day  Koleen Distance MS, RD, LDN Pager #- [phone] Office#- [phone] After Hours Pager: [phone]

## 2018-09-11 NOTE — Progress Notes (Signed)
Pharmacy - Antimicrobial Stewardship  Current plan is home with 14 days of linezolid with re-evaluation.  Prescription sent to [facility], cost is $1.30 for course.    - recommend not continuing dextromethorphan at discharge d/t risk serotonin syndrome - recommend monitoring blood pressure at home due to possible interaction between midodrine and linezolid (can cause increase BP).  Currently her BO remains low  Juliette Alcide, PharmD, BCPS.   Work Cell: [phone] 09/11/2018 1:11 PM

## 2018-09-11 NOTE — Progress Notes (Signed)
   09/11/18 1200  Clinical Encounter Type  Visited With Patient and family together  Visit Type Initial  Referral From Nurse  Ch helped the family and the pt complete an AD.

## 2018-09-11 NOTE — Progress Notes (Signed)
New referral for outpatient Palliative to follow at home received from [facility]. Patient information faxed to referral. Dayna Barker BSN, RN, [facility] liaison [facility] [place] [facility] [place] [phone]

## 2018-09-11 NOTE — Plan of Care (Deleted)
PMT note:   In to see patient, chaplain completing POA papers. Returned, and son was present who patient requested not to have information regarding her care. Will return this afternoon. If she is discharged prior to meeting, recommend palliative to follow outpatient.

## 2018-09-12 DIAGNOSIS — F17200 Nicotine dependence, unspecified, uncomplicated: Secondary | ICD-10-CM

## 2018-09-12 DIAGNOSIS — Z872 Personal history of diseases of the skin and subcutaneous tissue: Secondary | ICD-10-CM

## 2018-09-12 LAB — AEROBIC/ANAEROBIC CULTURE W GRAM STAIN (SURGICAL/DEEP WOUND)

## 2018-09-12 LAB — GLUCOSE, CAPILLARY
GLUCOSE-CAPILLARY: 156 mg/dL — AB (ref 70–99)
Glucose-Capillary: 175 mg/dL — ABNORMAL HIGH (ref 70–99)
Glucose-Capillary: 211 mg/dL — ABNORMAL HIGH (ref 70–99)
Glucose-Capillary: 309 mg/dL — ABNORMAL HIGH (ref 70–99)
Glucose-Capillary: 376 mg/dL — ABNORMAL HIGH (ref 70–99)

## 2018-09-12 MED ORDER — METOPROLOL TARTRATE 25 MG PO TABS
12.5000 mg | ORAL_TABLET | Freq: Two times a day (BID) | ORAL | Status: DC
  Administered 2018-09-12: 12.5 mg via ORAL
  Filled 2018-09-12: qty 1

## 2018-09-12 NOTE — Progress Notes (Signed)
   Date of Admission:  09/05/2018    Today 09/12/18  ID: April [name] is a 63 y.o. 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 aspirin  81 mg Oral Daily  . atorvastatin  40 mg Oral Daily  . collagenase   Topical Daily  . docusate sodium  100 mg Oral Daily  . ferrous sulfate  325 mg Oral BID WC  . gabapentin  100 mg Oral QHS  . insulin aspart  0-5 Units Subcutaneous QHS  . insulin aspart  0-9 Units Subcutaneous TID WC  . insulin glargine  15 Units Subcutaneous QHS  . linezolid  600 mg Oral Q12H  . metoprolol tartrate  12.5 mg Oral BID  . midodrine  10 mg Oral TID WC  . mometasone-formoterol  2 puff Inhalation BID  . multivitamin-lutein  1 capsule Oral Daily  . pantoprazole  40 mg Oral Daily  . polyethylene glycol  17 g Oral Daily  . povidone-iodine   Topical BID  . Ensure Max Protein  11 oz Oral BID  . rivaroxaban  10 mg Oral Daily  . sodium phosphate  1 enema Rectal Once  . tamsulosin  0.4 mg Oral Daily  . ascorbic acid  500 mg Oral BID    Objective: Vital signs in last 24 hours: Temp:  [97.7 F (36.5 C)-98.5 F (36.9 C)] 97.7 F (36.5 C) (03/13 1121) Pulse Rate:  [83-113] 113 (03/13 1121) Resp:  [20] 20 (03/13 0605) BP: (87-106)/(43-63) 106/63 (03/13 1121) SpO2:  [98 %-100 %] 98 % (03/13 1121)  PHYSICAL EXAM:  General: Alert, cooperative, no distress, appears stated age. Pale and chronically ill Lungs: Clear to auscultation bilaterally. No Wheezing or Rhonchi. No rales. Heart: Regular rate and rhythm, no murmur, rub or gallop. Abdomen: Soft, non-tender,not distended. Bowel sounds normal. No masses Extremities: Left BKA- healed Rt foot wound healed Left groin wound covered with wound vac Skin: No rashes or lesions. Or bruising Lymph: Cervical, supraclavicular normal. Neurologic: Grossly non-focal  Lab Results Recent Labs    09/10/18 0553   WBC 10.6*  HGB 8.8*  HCT 28.5*  NA 135  K 4.3  CL 107  CO2 23  BUN 14  CREATININE 0.56   Liver Panel No results for input(s): PROT, ALBUMIN, AST, ALT, ALKPHOS, BILITOT, BILIDIR, IBILI in the last 72 hours. 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 Has a wound vac- will need to see the wound  Need to treat this like endovascular infection- will need for  4 weeks - not sure whether she will be able to tolerate linezolid that long a period- will give for 2 weeks and reassess wound and weekly CBC and then decide.If she does not tolerate linezolid she will need IV daptomycin No yeast cultured from the wound   3/6 BC -coag neg staph is a contaminant  ?PAD - smoker- has had b/l femoral endarterectomy/stents  DM   Left BKA in Jan 2020- healed well  Rt foot infection /s/p surgery in jan 2020 has healed     Discussed the management with vascular NP- she will see her in 1 week and will touch base with me after that. CBC with diff /CMp will be done in 1 week

## 2018-09-12 NOTE — TOC Progression Note (Signed)
Transition of Care [facility]) - Progression Note    Patient Details  Name: April [name] MRN: [id number] Date of Birth: [birth year]  Transition of Care [facility]) CM/SW Contact  Chapman Fitch, RN Phone Number: 09/12/2018, 11:56 AM  Clinical Narrative:    Home wound vac was delivered to room by [facility]    Expected Discharge Plan: Home w Home Health Services    Expected Discharge Plan and Services Expected Discharge Plan: Home w Home Health Services Discharge Planning Services: CM Consult Post Acute Care Choice: Home Health, Resumption of Svcs/PTA Provider                   DME Arranged: Vac DME Agency: [facility] HH Arranged: RN, PT, Social Work, Nurse's Aide HH Agency: [facility]   Social Determinants of Health (SDOH) Interventions    Readmission Risk Interventions 30 Day Unplanned Readmission Risk Score     ED to Hosp-Admission (Current) from 09/05/2018 in [facility]  30 Day Unplanned Readmission Risk Score (%)  32 Filed at 09/12/2018 0801     This score is the patient's risk of an unplanned readmission within 30 days of being discharged (0 -100%). The score is based on dignosis, age, lab data, medications, orders, and past utilization.   Low:  0-14.9   Medium: 15-21.9   High: 22-29.9   Extreme: 30 and above       Readmission Risk Prevention Plan 09/11/2018  Transportation Screening Complete  Medication Review (RN Care Manager) Complete  HRI or Home Care Consult Complete  Palliative Care Screening Complete  Skilled Nursing Facility Patient Refused  Some recent data might be hidden

## 2018-09-12 NOTE — Care Management Important Message (Signed)
Important Message  Patient Details  Name: April [name] MRN: [id number] Date of Birth: 1956-03-22   Medicare Important Message Given:  Yes    York Spaniel, LCSW 09/12/2018, 1:28 PM

## 2018-09-12 NOTE — Progress Notes (Signed)
Patient ID: April [name], female   DOB: 11-23-1955, 63 y.o.   MRN: [id number]  [employer] PROGRESS NOTE  April [name] [id number] DOB: 11-23-1955 DOA: 09/05/2018 PCP: Mickel FuchsWroth, Thomas H, MD  HPI/Subjective: Patient seen today Had low blood pressure was started on IV fluids Patient was also tachycardic On oral midodrine No complaints of chest pain No shortness of breath  Objective: Vitals:   09/12/18 0605 09/12/18 1121  BP: (!) 87/43 106/63  Pulse: 83 (!) 113  Resp: 20   Temp: 98.5 F (36.9 C) 97.7 F (36.5 C)  SpO2: 100% 98%    Filed Weights   09/05/18 1056 09/05/18 1957  Weight: 39 kg 39 kg    ROS: Review of Systems  Constitutional: Negative for diaphoresis.  HENT: Negative for congestion, ear discharge, ear pain, nosebleeds and sore throat.   Eyes: Negative for blurred vision, pain and discharge.  Respiratory: Negative for shortness of breath.   Cardiovascular: Negative for chest pain, orthopnea and claudication.  Gastrointestinal: Negative for abdominal pain, diarrhea and vomiting.  Musculoskeletal: Positive for joint pain.  Neurological: Negative for tingling, tremors and headaches.  Psychiatric/Behavioral: Negative for depression and hallucinations.   Exam: Physical Exam  HENT:  Nose: No mucosal edema.  Mouth/Throat: No oropharyngeal exudate.  Eyes: Pupils are equal, round, and reactive to light. Conjunctivae and lids are normal.  Neck: Carotid bruit is not present. No thyromegaly present.  Cardiovascular: Regular rhythm, S1 normal, S2 normal and normal heart sounds.  Respiratory: She has no decreased breath sounds. She has no wheezes. She has no rhonchi. She has no rales.  GI: Soft. Bowel sounds are normal. There is no abdominal tenderness.  Musculoskeletal:     Left knee: She exhibits no swelling.     Right ankle: She exhibits no swelling.  Lymphadenopathy:    She has no cervical adenopathy.  Skin: Skin is warm. Nails show no clubbing.   Slight erythema over the skin of the left hip but no breaks in the skin on the left buttock or left hip area.  She does have a stage II decubitus on the buttock.  Left thigh covered with wound VAC.      Data Reviewed: Basic Metabolic Panel: Recent Labs  Lab 09/05/18 1426 09/07/18 0431 09/08/18 0218 09/10/18 0553 09/11/18 1425  NA 126* 131* 134* 135  --   K 5.0 4.5 4.4 4.3  --   CL 89* 101 104 107  --   CO2 27 24 24 23   --   GLUCOSE 544* 152* 226* 99  --   BUN 28* 24* 17 14  --   CREATININE 0.83 0.64 0.60 0.56  --   CALCIUM 7.7* 7.6* 7.5* 7.5*  --   MG  --   --   --  1.8 2.2   CBC: Recent Labs  Lab 09/05/18 1141 09/07/18 0431 09/08/18 0218 09/10/18 0553  WBC 18.4* 14.4* 13.8* 10.6*  NEUTROABS 16.7*  --   --   --   HGB 13.3 8.9* 9.2* 8.8*  HCT 42.5 28.6* 29.8* 28.5*  MCV 96.8 97.3 98.0 98.6  PLT 302 188 190 218   BNP (last 3 results) Recent Labs    06/10/18 0621  BNP 811.0*     CBG: Recent Labs  Lab 09/11/18 1653 09/11/18 1952 09/11/18 2342 09/12/18 0027 09/12/18 0740  GLUCAP 295* 336* 306* 309* 211*    Recent Results (from the past 240 hour(s))  Blood culture (routine x 2)     Status:  None   Collection Time: 09/05/18 11:42 AM  Result Value Ref Range Status   Specimen Description BLOOD RFA  Final   Special Requests   Final    BOTTLES DRAWN AEROBIC AND ANAEROBIC Blood Culture adequate volume   Culture   Final    NO GROWTH 5 DAYS Performed at [facility], [address] [place], [place] [address]    Report Status 09/10/2018 FINAL  Final  Blood culture (routine x 2)     Status: Abnormal   Collection Time: 09/05/18 12:24 PM  Result Value Ref Range Status   Specimen Description   Final    BLOOD BLOOD RIGHT HAND Performed at [facility], [address], [place], [place] [address]    Special Requests   Final    BOTTLES DRAWN AEROBIC ONLY Blood Culture adequate volume Performed at [facility], [address] [place], [place] [address]    Culture  Setup Time   Final    GRAM POSITIVE COCCI IN CLUSTERS AEROBIC BOTTLE ONLY CRITICAL RESULT CALLED TO, READ BACK BY AND VERIFIED WITH: Kevin Fenton PATEL  09/06/18 AKT    Culture (A)  Final    STAPHYLOCOCCUS SPECIES (COAGULASE NEGATIVE) THE SIGNIFICANCE OF ISOLATING THIS ORGANISM FROM A SINGLE SET OF BLOOD CULTURES WHEN MULTIPLE SETS ARE DRAWN IS UNCERTAIN. PLEASE NOTIFY THE MICROBIOLOGY DEPARTMENT WITHIN ONE WEEK IF SPECIATION AND SENSITIVITIES ARE REQUIRED. Performed at [facility], [address] [place], [place] [address]    Report Status 09/08/2018 FINAL  Final  Blood Culture ID Panel (Reflexed)     Status: Abnormal   Collection Time: 09/05/18 12:24 PM  Result Value Ref Range Status   Enterococcus species NOT DETECTED NOT DETECTED Final   Listeria monocytogenes NOT DETECTED NOT DETECTED Final   Staphylococcus species DETECTED (A) NOT DETECTED Final    Comment: Methicillin (oxacillin) resistant coagulase negative staphylococcus. Possible blood culture contaminant (unless isolated from more than one blood culture draw or clinical case suggests pathogenicity). No antibiotic treatment is indicated for blood  culture contaminants. CRITICAL RESULT CALLED TO, READ BACK BY AND VERIFIED WITH: Kevin Fenton PATEL  09/06/18 AKT    Staphylococcus aureus (BCID) NOT DETECTED NOT DETECTED Final   Methicillin resistance DETECTED (A) NOT DETECTED Final    Comment: CRITICAL RESULT CALLED TO, READ BACK BY AND VERIFIED WITH: KISHAN PATEL  09/06/18 AKT    Streptococcus species NOT DETECTED NOT DETECTED Final   Streptococcus agalactiae NOT DETECTED NOT DETECTED Final   Streptococcus pneumoniae NOT DETECTED NOT DETECTED Final   Streptococcus pyogenes NOT DETECTED NOT DETECTED Final   Acinetobacter baumannii NOT DETECTED NOT DETECTED Final   Enterobacteriaceae species NOT DETECTED NOT DETECTED Final   Enterobacter cloacae complex NOT DETECTED NOT DETECTED Final    Escherichia coli NOT DETECTED NOT DETECTED Final   Klebsiella oxytoca NOT DETECTED NOT DETECTED Final   Klebsiella pneumoniae NOT DETECTED NOT DETECTED Final   Proteus species NOT DETECTED NOT DETECTED Final   Serratia marcescens NOT DETECTED NOT DETECTED Final   Haemophilus influenzae NOT DETECTED NOT DETECTED Final   Neisseria meningitidis NOT DETECTED NOT DETECTED Final   Pseudomonas aeruginosa NOT DETECTED NOT DETECTED Final   Candida albicans NOT DETECTED NOT DETECTED Final   Candida glabrata NOT DETECTED NOT DETECTED Final   Candida krusei NOT DETECTED NOT DETECTED Final   Candida parapsilosis NOT DETECTED NOT DETECTED Final   Candida tropicalis NOT DETECTED NOT DETECTED Final    Comment: Performed at [facility],  [address] [place], [place] [address]  Aerobic/Anaerobic Culture (surgical/deep wound)     Status: None   Collection Time: 09/06/18  1:14 PM  Result Value Ref Range Status   Specimen Description   Final    WOUND GROIN Performed at [facility], [address] [place], [place] [address]    Special Requests   Final    NONE Performed at [facility], [address] [place], [place] [address]    Gram Stain   Final    FEW WBC PRESENT, PREDOMINANTLY PMN MODERATE GRAM POSITIVE COCCI IN PAIRS FEW BUDDING YEAST SEEN FEW GRAM POSITIVE RODS    Culture   Final    MODERATE METHICILLIN RESISTANT STAPHYLOCOCCUS AUREUS NO ANAEROBES ISOLATED Performed at [facility], [address], [place], [place] [address]    Report Status 09/11/2018 FINAL  Final   Organism ID, Bacteria METHICILLIN RESISTANT STAPHYLOCOCCUS AUREUS  Final      Susceptibility   Methicillin resistant staphylococcus aureus - MIC*    CIPROFLOXACIN >=8 RESISTANT Resistant     ERYTHROMYCIN >=8 RESISTANT Resistant     GENTAMICIN <=0.5 SENSITIVE Sensitive     OXACILLIN >=4 RESISTANT Resistant     TETRACYCLINE <=1 SENSITIVE Sensitive     VANCOMYCIN 1 SENSITIVE  Sensitive     TRIMETH/SULFA <=10 SENSITIVE Sensitive     CLINDAMYCIN >=8 RESISTANT Resistant     RIFAMPIN <=0.5 SENSITIVE Sensitive     Inducible Clindamycin NEGATIVE Sensitive     * MODERATE METHICILLIN RESISTANT STAPHYLOCOCCUS AUREUS  Aerobic/Anaerobic Culture (surgical/deep wound)     Status: None (Preliminary result)   Collection Time: 09/07/18  3:08 PM  Result Value Ref Range Status   Specimen Description   Final    WOUND GROIN LEFT GROIN Performed at [facility], [address], [place], [place] [address]    Special Requests   Final    NONE Performed at [facility], [address] [place], [place] [address]    Gram Stain   Final    ABUNDANT WBC PRESENT, PREDOMINANTLY PMN MODERATE GRAM POSITIVE COCCI MODERATE YEAST Performed at [facility], [address], [place], [place] [address]    Culture   Final    MODERATE ENTEROCOCCUS FAECIUM FEW METHICILLIN RESISTANT STAPHYLOCOCCUS AUREUS NO ANAEROBES ISOLATED; CULTURE IN PROGRESS FOR 5 DAYS    Report Status PENDING  Incomplete   Organism ID, Bacteria ENTEROCOCCUS FAECIUM  Final   Organism ID, Bacteria METHICILLIN RESISTANT STAPHYLOCOCCUS AUREUS  Final      Susceptibility   Enterococcus faecium - MIC*    AMPICILLIN >=32 RESISTANT Resistant     VANCOMYCIN >=32 RESISTANT Resistant     GENTAMICIN SYNERGY SENSITIVE Sensitive     LINEZOLID 2 SENSITIVE Sensitive     * MODERATE ENTEROCOCCUS FAECIUM   Methicillin resistant staphylococcus aureus - MIC*    CIPROFLOXACIN >=8 RESISTANT Resistant     ERYTHROMYCIN >=8 RESISTANT Resistant     GENTAMICIN <=0.5 SENSITIVE Sensitive     OXACILLIN >=4 RESISTANT Resistant     TETRACYCLINE <=1 SENSITIVE Sensitive     VANCOMYCIN 1 SENSITIVE Sensitive     TRIMETH/SULFA <=10 SENSITIVE Sensitive     CLINDAMYCIN <=0.25 SENSITIVE Sensitive     RIFAMPIN <=0.5 SENSITIVE Sensitive     Inducible Clindamycin NEGATIVE Sensitive     * FEW METHICILLIN RESISTANT STAPHYLOCOCCUS  AUREUS  Aerobic/Anaerobic Culture (surgical/deep wound)     Status: None   Collection Time: 09/07/18  3:09 PM  Result Value Ref Range Status   Specimen Description   Final    WOUND FEMORAL ARTERY LEFT GRAFT Performed at [facility], [address], [place], [place] [address]    Special Requests   Final    NONE Performed at [facility], [address] [place], [place] [address]    Gram Stain   Final    MODERATE WBC PRESENT,BOTH PMN AND MONONUCLEAR NO ORGANISMS SEEN    Culture   Final    RARE METHICILLIN RESISTANT STAPHYLOCOCCUS AUREUS FEW VANCOMYCIN RESISTANT ENTEROCOCCUS ISOLATED NO ANAEROBES ISOLATED CORRECTED ON 03/13 AT 1150: PREVIOUSLY REPORTED AS NO ANAEROBES ISOLATED; CULTURE IN PROGRESS FOR 5 DAYS Performed at [facility], [address] [place], [place] [address]    Report Status 09/12/2018 FINAL  Final   Organism ID, Bacteria METHICILLIN RESISTANT STAPHYLOCOCCUS AUREUS  Final   Organism ID, Bacteria VANCOMYCIN RESISTANT ENTEROCOCCUS ISOLATED  Final      Susceptibility   Methicillin resistant staphylococcus aureus - MIC*    CIPROFLOXACIN >=8 RESISTANT Resistant     ERYTHROMYCIN >=8 RESISTANT Resistant     GENTAMICIN <=0.5 SENSITIVE Sensitive     OXACILLIN >=4 RESISTANT Resistant     TETRACYCLINE <=1 SENSITIVE Sensitive     VANCOMYCIN <=0.5 SENSITIVE Sensitive     TRIMETH/SULFA <=10 SENSITIVE Sensitive     CLINDAMYCIN >=8 RESISTANT Resistant     RIFAMPIN <=0.5 SENSITIVE Sensitive     Inducible Clindamycin NEGATIVE Sensitive     * RARE METHICILLIN RESISTANT STAPHYLOCOCCUS AUREUS   Vancomycin resistant enterococcus isolated - MIC*    AMPICILLIN >=32 RESISTANT Resistant     VANCOMYCIN >=32 RESISTANT Resistant     GENTAMICIN SYNERGY SENSITIVE Sensitive     LINEZOLID 2 SENSITIVE Sensitive     * FEW VANCOMYCIN RESISTANT ENTEROCOCCUS ISOLATED     Studies: No results found.  Scheduled Meds: . aspirin  81 mg Oral Daily  . atorvastatin  40 mg  Oral Daily  . collagenase   Topical Daily  . docusate sodium  100 mg Oral Daily  . ferrous sulfate  325 mg Oral BID WC  . gabapentin  100 mg Oral QHS  . insulin aspart  0-5 Units Subcutaneous QHS  . insulin aspart  0-9 Units Subcutaneous TID WC  . insulin glargine  15 Units Subcutaneous QHS  . linezolid  600 mg Oral Q12H  . metoprolol tartrate  12.5 mg Oral BID  . midodrine  10 mg Oral TID WC  . mometasone-formoterol  2 puff Inhalation BID  . multivitamin-lutein  1 capsule Oral Daily  . pantoprazole  40 mg Oral Daily  . polyethylene glycol  17 g Oral Daily  . povidone-iodine   Topical BID  . Ensure Max Protein  11 oz Oral BID  . rivaroxaban  10 mg Oral Daily  . sodium phosphate  1 enema Rectal Once  . tamsulosin  0.4 mg Oral Daily  . ascorbic acid  500 mg Oral BID   Continuous Infusions: . sodium chloride Stopped (09/09/18 0754)  . sodium chloride    . sodium chloride 40 mL/hr at 09/09/18 1836  . sodium chloride 75 mL/hr at 09/12/18 0805  . magnesium sulfate 1 - 4 g bolus IVPB      Assessment/Plan:  1. Clinical sepsis with left groin infection.  MRSA growing out of wound culture.  On vancomycin.  Blood culture results with vancomycin-resistant enterococcus and Staphylococcus aureus.  Vancomycin discontinued patient started on Zyvox ID consult placed  blood culture likely skin contamination.  Wound VAC dressing change in OR by vascular surgery today is need to be continued twice a week after discharge.  Outpatient follow-up with vascular surgery in 2 weeks after discharge.  As per infectious disease attending patient will need oral Zyvox for 2 weeks.  Will discuss with case management regarding setting up home health services and wound VAC services. 2. Left buttock hematoma.  Xarelto on hold.  We will try to resume in a.m. if clinically stable. surgery signed off 3. Hyponatremia.  Improved with IV fluid 4. Type 2 diabetes mellitus on low-dose glargine and sliding scale 5. Stage II  decubiti local wound care 6. Severe malnutrition 7. Chronic hypotension on midodrine 8. Hyperlipidemia unspecified on atorvastatin 9. Diabetic neuropathy on gabapentin 10. GERD on PPI 11. Peripheral vascular disease.  Holding Xarelto at this time.  Left leg amputation site looks good. 12. Pain control will be an issue because she gets decreased respirations once given pain medications.  Try to convert over to Tylenol as quickly as possible.  Trial of tramadol instead. 13. Hypotension improved with IV fluids.  Tachycardia, started on low-dose metoprolol will monitor  Code Status: Full code    Code Status Orders  (From admission, onward)         Start     Ordered   09/05/18 1941  Full code  Continuous     09/05/18 1940        Code Status History    Date Active Date Inactive Code Status Order ID Comments User Context   08/21/2018 1658 08/25/2018 1932 DNR [id number]  Adrian Saran, MD ED   08/21/2018 1656 08/21/2018 1658 DNR [id number]  Adrian Saran, MD ED   07/21/2018 1302 07/26/2018 2148 DNR [id number]  Glee Arvin, NP Inpatient   07/09/2018 1730 07/21/2018 1302 Partial Code [id number]  Mayo, Allyn Kenner, MD Inpatient   06/03/2018 0621 06/13/2018 2013 Full Code [id number]  Arnaldo Natal, MD Inpatient   05/23/2018 0253 06/01/2018 2158 Full Code [id number]  Cammy Copa, MD Inpatient   05/23/2018 0138 05/23/2018 0253 DNR [id number]  Cammy Copa, MD ED   04/10/2018 1111 04/11/2018 1554 DNR [id number]  Milagros Loll, MD Inpatient   04/09/2018 1519 04/10/2018 1111 Full Code [id number]  Milagros Loll, MD Inpatient   04/09/2018 1337 04/09/2018 1519 DNR [id number]  Milagros Loll, MD Inpatient   04/09/2018 0012 04/09/2018 1337 Full Code [id number]  Oralia Manis, MD ED     Family Communication: Spoke with daughter yesterday Disposition Plan: To be determined  Consultants:  Vascular surgery  General surgery  Antibiotics:  Vancomycin  Time spent: 35  minutes, case discussed with  pharmacist and nursing staff  Ihor Austin  [employer]

## 2018-09-13 DIAGNOSIS — A419 Sepsis, unspecified organism: Secondary | ICD-10-CM

## 2018-09-13 LAB — CBC
HCT: 28.7 % — ABNORMAL LOW (ref 36.0–46.0)
Hemoglobin: 8.7 g/dL — ABNORMAL LOW (ref 12.0–15.0)
MCH: 30.2 pg (ref 26.0–34.0)
MCHC: 30.3 g/dL (ref 30.0–36.0)
MCV: 99.7 fL (ref 80.0–100.0)
Platelets: 245 10*3/uL (ref 150–400)
RBC: 2.88 MIL/uL — ABNORMAL LOW (ref 3.87–5.11)
RDW: 16.6 % — ABNORMAL HIGH (ref 11.5–15.5)
WBC: 11.1 10*3/uL — ABNORMAL HIGH (ref 4.0–10.5)
nRBC: 0 % (ref 0.0–0.2)

## 2018-09-13 LAB — GLUCOSE, CAPILLARY
Glucose-Capillary: 42 mg/dL — CL (ref 70–99)
Glucose-Capillary: 57 mg/dL — ABNORMAL LOW (ref 70–99)
Glucose-Capillary: 91 mg/dL (ref 70–99)
Glucose-Capillary: 127 mg/dL — ABNORMAL HIGH (ref 70–99)
Glucose-Capillary: 204 mg/dL — ABNORMAL HIGH (ref 70–99)

## 2018-09-13 LAB — AEROBIC/ANAEROBIC CULTURE W GRAM STAIN (SURGICAL/DEEP WOUND)

## 2018-09-13 MED ORDER — FERROUS SULFATE 325 (65 FE) MG PO TABS: 325.0000 mg | ORAL_TABLET | Freq: Two times a day (BID) | ORAL | 0 refills | Status: DC

## 2018-09-13 MED ORDER — PANTOPRAZOLE SODIUM 40 MG PO TBEC: 40.0000 mg | DELAYED_RELEASE_TABLET | Freq: Every day | ORAL | Status: DC

## 2018-09-13 MED ORDER — METOPROLOL TARTRATE 25 MG PO TABS: 12.5000 mg | ORAL_TABLET | Freq: Two times a day (BID) | ORAL | 0 refills | Status: DC

## 2018-09-13 MED ORDER — DEXTROSE 50 % IV SOLN
INTRAVENOUS | Status: AC
  Administered 2018-09-13: 25 mL
  Filled 2018-09-13: qty 50

## 2018-09-13 MED ORDER — INSULIN GLARGINE 100 UNIT/ML ~~LOC~~ SOLN: 8.0000 [IU] | Freq: Every day | SUBCUTANEOUS | 11 refills | Status: DC

## 2018-09-13 MED ORDER — TRAMADOL HCL 50 MG PO TABS: 50.0000 mg | ORAL_TABLET | Freq: Four times a day (QID) | ORAL | 0 refills | Status: AC | PRN

## 2018-09-13 MED ORDER — DEXTROSE 50 % IV SOLN: 25.0000 mL | Freq: Once | INTRAVENOUS | Status: DC

## 2018-09-13 NOTE — TOC Transition Note (Addendum)
Transition of Care [facility]) - CM/SW Discharge Note   Patient Details  Name: April [name] MRN: [id number] Date of Birth: 16-Dec-1955  Transition of Care [facility]) CM/SW Contact:  Virgel Manifold, RN Phone Number: 09/13/2018, 11:20 AM   Clinical Narrative:   Patient to be discharged per MD order. Orders in place for home health services. Patient active with [facility] home health. Notified [place] from [place] of pending discharge and resumption of care. Wound vac ordered from [employer]. Delivered to bedside. Bedside RN to apply prior to discharge. Patient has hospital bed, wheelchair,etc from previous hospitalization. Outpatient palliative referral previosuly set up. EMS for transport.      Final next level of care: Home w Home Health Services Barriers to Discharge: No Barriers Identified   Patient Goals and CMS Choice Patient states their goals for this hospitalization and ongoing recovery are:: to not be in pain CMS Medicare.gov Compare Post Acute Care list provided to:: Patient Choice offered to / list presented to : Patient  Discharge Placement                       Discharge Plan and Services Discharge Planning Services: CM Consult Post Acute Care Choice: Home Health, Resumption of Svcs/PTA Provider          DME Arranged: Vac DME Agency: [facility] HH Arranged: RN, PT, Social Work, Nurse's Aide HH Agency: [facility]   Social Determinants of Health (SDOH) Interventions     Readmission Risk Interventions Readmission Risk Prevention Plan 09/11/2018  Transportation Screening Complete  Medication Review [employer]) Complete  HRI or Home Care Consult Complete  Palliative Care Screening Complete  Skilled Nursing Facility Patient Refused  Some recent data might be hidden

## 2018-09-13 NOTE — Discharge Summary (Addendum)
[employer] [facility] [facility] [place] [facility]   PATIENT NAME: April [name]    MR#:  [id number]  DATE OF BIRTH:  15-Apr-1956  DATE OF ADMISSION:  09/05/2018 ADMITTING PHYSICIAN: Evelena Asa Salary, MD  DATE OF DISCHARGE: 09/13/2018  PRIMARY CARE PHYSICIAN: Mickel Fuchs, MD    ADMISSION DIAGNOSIS:  Cellulitis and abscess of buttock [L02.31, L03.317] Urinary retention [R33.9] Type 2 diabetes mellitus with hyperglycemia, with long-term current use of insulin (HCC) [E11.65, Z79.4] Surgical site infection [T81.49XA] Sepsis without acute organ dysfunction, due to unspecified organism (HCC) [A41.9]  DISCHARGE DIAGNOSIS:  Active Problems:   Sepsis (HCC)   SECONDARY DIAGNOSIS:   Past Medical History:  Diagnosis Date  . Anxiety   . Colon polyps   . COPD (chronic obstructive pulmonary disease) (HCC)   . Diabetes mellitus without complication (HCC)   . DVT (deep venous thrombosis) (HCC)   . DVT (deep venous thrombosis) (HCC)   . History of colonic polyps   . Hyperlipidemia   . Peripheral artery disease (HCC)   . Pulmonary nodules/lesions, multiple 08/2012   NEGATIVE PET SCAN  . Retroperitoneal abscess (HCC)    history of in 04/2013  . Tobacco abuse     HOSPITAL COURSE:   63 year old female with a history of left BKA status post endarterectomy who presented to the emergency room due to left leg pain and drainage from left femur wound from previously and endarterectomy site.  1.  Sepsis from left femoral graft/endarterectomy site: Patient is status post exploration on March 6.  She currently has a wound VAC.  As per ID this infection will need to be treated for 4 weeks like endovascular infection.  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  ID has recommended linezolid.  If she is unable to tolerate this then she will need IV daptomycin. Vascular surgery will see patient in 1 week for follow-up as well as lab work.  She will send this to Dr. Rudene Anda who will review  the results and address accordingly.   2.  Left buttock hematoma: Hemoglobin stable  3.  Hyponatremia: This improved with IV fluids  4.  Diabetes: Blood sugars have been low/normal.  We decreased the dose of Lantus.  She will continue with ADA diet.  5.  Severe protein calorie malnutrition  6.  Chronic hypotension: Patient will continue midodrine  7.  Peripheral vascular disease: Continue Xarelto, aspirin and statin 8.  Sinus tachycardia: Metoprolol was started however due to low blood pressure and improved heart rate we will not continue this at discharge.   Outpatient palliative care services DISCHARGE CONDITIONS AND DIET:   Stable for discharge on regular diet/diabetic  CONSULTS OBTAINED:  Treatment Team:  Louisa Second, MD Signa Kell, MD  DRUG ALLERGIES:   Allergies  Allergen Reactions  . Eggs Or Egg-Derived Products Diarrhea, Itching and Nausea And Vomiting    DISCHARGE MEDICATIONS:   Allergies as of 09/13/2018      Reactions   Eggs Or Egg-derived Products Diarrhea, Itching, Nausea And Vomiting      Medication List    STOP taking these medications   furosemide 40 MG tablet Commonly known as:  LASIX   insulin aspart 100 UNIT/ML injection Commonly known as:  novoLOG     TAKE these medications   albuterol (2.5 MG/3ML) 0.083% nebulizer solution Commonly known as:  PROVENTIL Inhale 2.5 mg into the lungs every 6 (six) hours as needed for wheezing or shortness of breath.   albuterol 108 (  90 Base) MCG/ACT inhaler Commonly known as:  PROVENTIL HFA;VENTOLIN HFA Inhale 2 puffs into the lungs every 6 (six) hours as needed for wheezing or shortness of breath.   ALPRAZolam 0.5 MG tablet Commonly known as:  XANAX Take 1 tablet (0.5 mg total) by mouth 2 (two) times daily as needed for anxiety.   ascorbic acid 500 MG tablet Commonly known as:  VITAMIN C Take 1 tablet (500 mg total) by mouth 2 (two) times daily.   aspirin 81 MG chewable tablet Chew 81 mg by  mouth daily.   atorvastatin 40 MG tablet Commonly known as:  LIPITOR Take 40 mg by mouth daily.   collagenase ointment Commonly known as:  SANTYL Apply topically daily.   ferrous sulfate 325 (65 FE) MG tablet Take 1 tablet (325 mg total) by mouth 2 (two) times daily with a meal for 30 days.   Fluticasone-Salmeterol 250-50 MCG/DOSE Aepb Commonly known as:  Advair Diskus Inhale 1 puff into the lungs 2 (two) times daily.   gabapentin 100 MG capsule Commonly known as:  NEURONTIN Take 1 capsule (100 mg total) by mouth at bedtime.   guaiFENesin-dextromethorphan 100-10 MG/5ML syrup Commonly known as:  ROBITUSSIN DM Take 5 mLs by mouth every 4 (four) hours as needed for cough.   insulin glargine 100 UNIT/ML injection Commonly known as:  LANTUS Inject 0.08 mLs (8 Units total) into the skin at bedtime. What changed:  how much to take   ipratropium-albuterol 0.5-2.5 (3) MG/3ML Soln Commonly known as:  DUONEB Take 3 mLs by nebulization 3 (three) times daily.   linezolid 600 MG tablet Commonly known as:  ZYVOX Take 1 tablet (600 mg total) by mouth 2 (two) times daily.   metFORMIN 500 MG tablet Commonly known as:  GLUCOPHAGE Take 500 mg by mouth daily with breakfast.   midodrine 10 MG tablet Commonly known as:  PROAMATINE Take 1 tablet (10 mg total) by mouth 3 (three) times daily with meals.   pantoprazole 40 MG tablet Commonly known as:  PROTONIX Take 1 tablet (40 mg total) by mouth daily. What changed:  when to take this   polyethylene glycol packet Commonly known as:  MIRALAX / GLYCOLAX Take 17 g by mouth daily.   potassium chloride 10 MEQ tablet Commonly known as:  K-DUR,KLOR-CON Take by mouth.   protein supplement shake Liqd Commonly known as:  PREMIER PROTEIN Take 325 mLs (11 oz total) by mouth 2 (two) times daily between meals.   Spiriva HandiHaler 18 MCG inhalation capsule Generic drug:  tiotropium Place 18 mcg into inhaler and inhale daily.   tamsulosin  0.4 MG Caps capsule Commonly known as:  FLOMAX Take 1 capsule (0.4 mg total) by mouth daily.   traMADol 50 MG tablet Commonly known as:  ULTRAM Take 1 tablet (50 mg total) by mouth every 6 (six) hours as needed for up to 5 days for moderate pain.   Xarelto 10 MG Tabs tablet Generic drug:  rivaroxaban Take 10 mg by mouth every evening.         Today   CHIEF COMPLAINT:  No acute issues overnight   VITAL SIGNS:  Blood pressure (!) 95/36, pulse 96, temperature 98.2 F (36.8 C), temperature source Oral, resp. rate 18, height 5\' 2"  (1.575 m), weight 39 kg, SpO2 97 %.   REVIEW OF SYSTEMS:  Review of Systems  Constitutional: Negative.  Negative for chills, fever and malaise/fatigue.  HENT: Negative.  Negative for ear discharge, ear pain, hearing loss, nosebleeds and  sore throat.   Eyes: Negative.  Negative for blurred vision and pain.  Respiratory: Negative.  Negative for cough, hemoptysis, shortness of breath and wheezing.   Cardiovascular: Negative.  Negative for chest pain, palpitations and leg swelling.  Gastrointestinal: Negative.  Negative for abdominal pain, blood in stool, diarrhea, nausea and vomiting.  Genitourinary: Negative.  Negative for dysuria.  Musculoskeletal: Positive for joint pain. Negative for back pain and falls.  Skin: Negative.        Wound vac  Neurological: Negative for dizziness, tremors, speech change, focal weakness, seizures and headaches.  Endo/Heme/Allergies: Negative.  Does not bruise/bleed easily.  Psychiatric/Behavioral: Positive for memory loss. Negative for depression, hallucinations and suicidal ideas. The patient is not nervous/anxious.      PHYSICAL EXAMINATION:  GENERAL:  63 y.o.-year-old patient lying in the bed with no acute distress.  NECK:  Supple, no jugular venous distention. No thyroid enlargement, no tenderness.  LUNGS: Normal breath sounds bilaterally, no wheezing, rales,rhonchi  No use of accessory muscles of respiration.   CARDIOVASCULAR: S1, S2 normal. No murmurs, rubs, or gallops.  ABDOMEN: Soft, non-tender, non-distended. Bowel sounds present. No organomegaly or mass.  EXTREMITIES: No pedal edema, cyanosis, or clubbing.  PSYCHIATRIC: The patient is alert and oriented x 3.  SKIN: No obvious rash, lesion, or ulcer.  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 DATA REVIEW:   CBC Recent Labs  Lab 09/13/18 0458  WBC 11.1*  HGB 8.7*  HCT 28.7*  PLT 245    Chemistries  Recent Labs  Lab 09/10/18 0553 09/11/18 1425  NA 135  --   K 4.3  --   CL 107  --   CO2 23  --   GLUCOSE 99  --   BUN 14  --   CREATININE 0.56  --   CALCIUM 7.5*  --   MG 1.8 2.2    Cardiac Enzymes No results for input(s): TROPONINI in the last 168 hours.  Microbiology Results  @  RADIOLOGY:  No results found.    Allergies as of 09/13/2018      Reactions   Eggs Or Egg-derived Products Diarrhea, Itching, Nausea And Vomiting      Medication List    STOP taking these medications   furosemide 40 MG tablet Commonly known as:  LASIX   insulin aspart 100 UNIT/ML injection Commonly known as:  novoLOG     TAKE these medications   albuterol (2.5 MG/3ML) 0.083% nebulizer solution Commonly known as:  PROVENTIL Inhale 2.5 mg into the lungs every 6 (six) hours as needed for wheezing or shortness of breath.   albuterol 108 (90 Base) MCG/ACT inhaler Commonly known as:  PROVENTIL HFA;VENTOLIN HFA Inhale 2 puffs into the lungs every 6 (six) hours as needed for wheezing or shortness of breath.   ALPRAZolam 0.5 MG tablet Commonly known as:  XANAX Take 1 tablet (0.5 mg total) by mouth 2 (two) times daily as needed for anxiety.   ascorbic acid 500 MG tablet Commonly known as:  VITAMIN C Take 1 tablet (500 mg total) by mouth 2 (two) times daily.   aspirin 81 MG chewable tablet Chew 81 mg by mouth daily.    atorvastatin 40 MG tablet Commonly known as:  LIPITOR Take 40 mg by mouth daily.   collagenase ointment Commonly known as:  SANTYL Apply topically daily.   ferrous sulfate 325 (65 FE) MG tablet Take 1 tablet (325  mg total) by mouth 2 (two) times daily with a meal for 30 days.   Fluticasone-Salmeterol 250-50 MCG/DOSE Aepb Commonly known as:  Advair Diskus Inhale 1 puff into the lungs 2 (two) times daily.   gabapentin 100 MG capsule Commonly known as:  NEURONTIN Take 1 capsule (100 mg total) by mouth at bedtime.   guaiFENesin-dextromethorphan 100-10 MG/5ML syrup Commonly known as:  ROBITUSSIN DM Take 5 mLs by mouth every 4 (four) hours as needed for cough.   insulin glargine 100 UNIT/ML injection Commonly known as:  LANTUS Inject 0.08 mLs (8 Units total) into the skin at bedtime. What changed:  how much to take   ipratropium-albuterol 0.5-2.5 (3) MG/3ML Soln Commonly known as:  DUONEB Take 3 mLs by nebulization 3 (three) times daily.   linezolid 600 MG tablet Commonly known as:  ZYVOX Take 1 tablet (600 mg total) by mouth 2 (two) times daily.   metFORMIN 500 MG tablet Commonly known as:  GLUCOPHAGE Take 500 mg by mouth daily with breakfast.   midodrine 10 MG tablet Commonly known as:  PROAMATINE Take 1 tablet (10 mg total) by mouth 3 (three) times daily with meals.   pantoprazole 40 MG tablet Commonly known as:  PROTONIX Take 1 tablet (40 mg total) by mouth daily. What changed:  when to take this   polyethylene glycol packet Commonly known as:  MIRALAX / GLYCOLAX Take 17 g by mouth daily.   potassium chloride 10 MEQ tablet Commonly known as:  K-DUR,KLOR-CON Take by mouth.   protein supplement shake Liqd Commonly known as:  PREMIER PROTEIN Take 325 mLs (11 oz total) by mouth 2 (two) times daily between meals.   Spiriva HandiHaler 18 MCG inhalation capsule Generic drug:  tiotropium Place 18 mcg into inhaler and inhale daily.   tamsulosin 0.4 MG Caps  capsule Commonly known as:  FLOMAX Take 1 capsule (0.4 mg total) by mouth daily.   traMADol 50 MG tablet Commonly known as:  ULTRAM Take 1 tablet (50 mg total) by mouth every 6 (six) hours as needed for up to 5 days for moderate pain.   Xarelto 10 MG Tabs tablet Generic drug:  rivaroxaban Take 10 mg by mouth every evening.          Management plans discussed with the patient and she is in agreement. Stable for discharge   Patient should follow up with pcp  CODE STATUS:     Code Status Orders  (From admission, onward)         Start     Ordered   09/05/18 1941  Full code  Continuous     09/05/18 1940        Code Status History    Date Active Date Inactive Code Status Order ID Comments User Context   08/21/2018 1658 08/25/2018 1932 DNR [id number]  Adrian Saran, MD ED   08/21/2018 1656 08/21/2018 1658 DNR [id number]  Adrian Saran, MD ED   07/21/2018 1302 07/26/2018 2148 DNR [id number]  Glee Arvin, NP Inpatient   07/09/2018 1730 07/21/2018 1302 Partial Code [id number]  Mayo, Allyn Kenner, MD Inpatient   06/03/2018 0621 06/13/2018 2013 Full Code [id number]  Arnaldo Natal, MD Inpatient   05/23/2018 0253 06/01/2018 2158 Full Code [id number]  Cammy Copa, MD Inpatient   05/23/2018 0138 05/23/2018 0253 DNR [id number]  Cammy Copa, MD ED   04/10/2018 1111 04/11/2018 1554 DNR [id number]  Milagros Loll, MD Inpatient   04/09/2018 1519 04/10/2018 1111 Full Code [id number]  Milagros Loll, MD  Inpatient   04/09/2018 1337 04/09/2018 1519 DNR [id number]  Milagros Loll, MD Inpatient   04/09/2018 0012 04/09/2018 1337 Full Code [id number]  Oralia Manis, MD ED      TOTAL TIME TAKING CARE OF THIS PATIENT: 38 minutes.    Note: This dictation was prepared with Dragon dictation along with smaller phrase technology. Any transcriptional errors that result from this process are unintentional.  Eryck Negron M.D on 09/13/2018 at 11:29 AM  Between 7am to 6pm - Pager - [phone] After 6pm go  to [email or web address] - Social research officer, government  [facility]  Office  [phone]  CC: Primary care physician; Mickel Fuchs, MD

## 2018-09-13 NOTE — Progress Notes (Signed)
09/13/2018 7:08 PM  Spoke with patient's grand daughter, who is the patient's caregiver.  She stated the pharmacy did not want to fill prescription for patient's Tramadol, citing interactions with prescribed antibiotic.  Patient's discharging MD Juliene Pina has left the hospital.  Spoke with doctor on call Salary.  Provided with grand daughter's contact info and he stated he would speak with her.  Bradly Chris, RN

## 2018-09-13 NOTE — Progress Notes (Signed)
09/13/2018 1830  April [name] to be D/C'd Home per MD order.  Discussed prescriptions and follow up appointments with the patient. Prescriptions given to patient, medication list explained in detail. Pt verbalized understanding.  Allergies as of 09/13/2018      Reactions   Eggs Or Egg-derived Products Diarrhea, Itching, Nausea And Vomiting      Medication List    STOP taking these medications   furosemide 40 MG tablet Commonly known as:  LASIX   insulin aspart 100 UNIT/ML injection Commonly known as:  novoLOG     TAKE these medications   albuterol (2.5 MG/3ML) 0.083% nebulizer solution Commonly known as:  PROVENTIL Inhale 2.5 mg into the lungs every 6 (six) hours as needed for wheezing or shortness of breath.   albuterol 108 (90 Base) MCG/ACT inhaler Commonly known as:  PROVENTIL HFA;VENTOLIN HFA Inhale 2 puffs into the lungs every 6 (six) hours as needed for wheezing or shortness of breath.   ALPRAZolam 0.5 MG tablet Commonly known as:  XANAX Take 1 tablet (0.5 mg total) by mouth 2 (two) times daily as needed for anxiety.   ascorbic acid 500 MG tablet Commonly known as:  VITAMIN C Take 1 tablet (500 mg total) by mouth 2 (two) times daily.   aspirin 81 MG chewable tablet Chew 81 mg by mouth daily.   atorvastatin 40 MG tablet Commonly known as:  LIPITOR Take 40 mg by mouth daily.   collagenase ointment Commonly known as:  SANTYL Apply topically daily.   ferrous sulfate 325 (65 FE) MG tablet Take 1 tablet (325 mg total) by mouth 2 (two) times daily with a meal for 30 days.   Fluticasone-Salmeterol 250-50 MCG/DOSE Aepb Commonly known as:  Advair Diskus Inhale 1 puff into the lungs 2 (two) times daily.   gabapentin 100 MG capsule Commonly known as:  NEURONTIN Take 1 capsule (100 mg total) by mouth at bedtime.   guaiFENesin-dextromethorphan 100-10 MG/5ML syrup Commonly known as:  ROBITUSSIN DM Take 5 mLs by mouth every 4 (four) hours as needed for cough.    insulin glargine 100 UNIT/ML injection Commonly known as:  LANTUS Inject 0.08 mLs (8 Units total) into the skin at bedtime. What changed:  how much to take   ipratropium-albuterol 0.5-2.5 (3) MG/3ML Soln Commonly known as:  DUONEB Take 3 mLs by nebulization 3 (three) times daily.   linezolid 600 MG tablet Commonly known as:  ZYVOX Take 1 tablet (600 mg total) by mouth 2 (two) times daily.   metFORMIN 500 MG tablet Commonly known as:  GLUCOPHAGE Take 500 mg by mouth daily with breakfast.   midodrine 10 MG tablet Commonly known as:  PROAMATINE Take 1 tablet (10 mg total) by mouth 3 (three) times daily with meals.   pantoprazole 40 MG tablet Commonly known as:  PROTONIX Take 1 tablet (40 mg total) by mouth daily. What changed:  when to take this   polyethylene glycol packet Commonly known as:  MIRALAX / GLYCOLAX Take 17 g by mouth daily.   potassium chloride 10 MEQ tablet Commonly known as:  K-DUR,KLOR-CON Take by mouth.   protein supplement shake Liqd Commonly known as:  PREMIER PROTEIN Take 325 mLs (11 oz total) by mouth 2 (two) times daily between meals.   Spiriva HandiHaler 18 MCG inhalation capsule Generic drug:  tiotropium Place 18 mcg into inhaler and inhale daily.   tamsulosin 0.4 MG Caps capsule Commonly known as:  FLOMAX Take 1 capsule (0.4 mg total) by mouth daily.  traMADol 50 MG tablet Commonly known as:  ULTRAM Take 1 tablet (50 mg total) by mouth every 6 (six) hours as needed for up to 5 days for moderate pain.   Xarelto 10 MG Tabs tablet Generic drug:  rivaroxaban Take 10 mg by mouth every evening.       Vitals:   09/13/18 0426 09/13/18 1652  BP: (!) 95/36 96/63  Pulse: 96 94  Resp: 18 16  Temp: 98.2 F (36.8 C) 97.6 F (36.4 C)  SpO2: 97% 98%    Skin clean, dry and intact without evidence of skin break down, no evidence of skin tears noted. IV catheter discontinued intact. Site without signs and symptoms of complications. Dressing  and pressure applied. Pt denies pain at this time. No complaints noted.  An After Visit Summary was printed and given to the patient. Patient escorted and D/C via EMS Bradly Chris

## 2018-09-13 NOTE — Progress Notes (Signed)
Occupational Therapy Treatment Patient Details Name: April [name] MRN: [id number] DOB: [birth year] Today's Date: 09/13/2018    History of present illness Pt is a 63 y.o. female s/p  Left groin wound exploration, sharp debridement, & removal of infected femoral enarterectomy patch. Pt presented to [facility] 09/07/18 with AMS status complaining of pain in groin and buttocks. PMH includes DM, COPD, recent L LE BKA (07/24/18), DVT, and acute osteomyelitis R foot..  I & D on 3/11 under MAC and no change in function so no re-eval needed and continued orders in place.   OT comments  Pt seen for OT treatment on this date. Upon arrival to room pt awake/alert semi-reclined upright in bed with her granddaughters present at bedside. Pt reporting 8/10 pain in her groin and L LE. RN notified OT pt medicated prior to session. Pt and caregivers instructed in cognitive behavioral pain mgmt strategies and strategies for residual limb mgmt including gentle tapping and circular massage in order to decrease sensitivity and minimize residual limb pain in L LE. Instruction provided on safe hygiene practices including thorough hand washing before and after residual limb care. Pt and caregivers verbalized understanding. Pt making progressing toward goals. Pt continues to benefit from skilled OT services to maximize return to PLOF and minimize risk of future falls, injury, caregiver burden, and readmission. Will continue to follow POC. Discharge recommendation remains appropriate.    Follow Up Recommendations  SNF;Supervision/Assistance - 24 hour    Equipment Recommendations  Tub/shower bench    Recommendations for Other Services      Precautions / Restrictions Precautions Precautions: Fall Precaution Comments: Prevalon boot R LE Restrictions Weight Bearing Restrictions: Yes RLE Weight Bearing: Non weight bearing LLE Weight Bearing: (L BKA) Other Position/Activity Restrictions: L LE BKA 07/24/18; pt reports NWB R LE d/t  R foot wounds       Mobility Bed Mobility               General bed mobility comments: Deferred. Pt in bed at start and end of session endorsing 8/10 pain.  Transfers                 General transfer comment: Family endorse pt being carried for mobility in the home. Pt endorses she would like to "get back to walking".    Balance Overall balance assessment: Needs assistance Sitting-balance support: Bilateral upper extremity supported;Feet supported Sitting balance-Leahy Scale: Good Sitting balance - Comments: steady sitting reaching within BOS                                   ADL either performed or assessed with clinical judgement   ADL Overall ADL's : Needs assistance/impaired Eating/Feeding: Set up;Supervision/ safety Eating/Feeding Details (indicate cue type and reason): Pt able to open chocolate ice cream and feed self on this date with set-up assist. Pt family endorse this is her baseline.                                          Vision       Perception     Praxis      Cognition Arousal/Alertness: Awake/alert Behavior During Therapy: Flat affect Overall Cognitive Status: Within Functional Limits for tasks assessed  General Comments: Pt alert, able to answer questions. Family at bedside confirm pt at her baseline level of cognition now.         Exercises Other Exercises Other Exercises: Pt and caregivers provided with reinforcement on prior education in cognitive behavioral pain mgmt strategies as well as residual limb care including gentle tapping and circular massage.   Shoulder Instructions       General Comments      Pertinent Vitals/ Pain       Pain Score: 8  Pain Location: groin/LLE Pain Descriptors / Indicators: Grimacing;Operative site guarding;Constant Pain Intervention(s): Limited activity within patient's tolerance;Monitored during session;Premedicated  before session;Utilized relaxation techniques  Home Living                                          Prior Functioning/Environment              Frequency  Min 2X/week        Progress Toward Goals  OT Goals(current goals can now be found in the care plan section)  Progress towards OT goals: Progressing toward goals  Acute Rehab OT Goals Patient Stated Goal: to domore for myself again OT Goal Formulation: With patient Time For Goal Achievement: 09/22/18 Potential to Achieve Goals: [place] Discharge plan remains appropriate    Co-evaluation                 AM-PAC OT "6 Clicks" Daily Activity     Outcome Measure   Help from another person eating meals?: A Little Help from another person taking care of personal grooming?: None Help from another person toileting, which includes using toliet, bedpan, or urinal?: A Lot Help from another person bathing (including washing, rinsing, drying)?: A Lot Help from another person to put on and taking off regular upper body clothing?: A Little Help from another person to put on and taking off regular lower body clothing?: A Lot 6 Click Score: 16    End of Session    OT Visit Diagnosis: Other abnormalities of gait and mobility (R26.89);History of falling (Z91.81);Pain Pain - Right/Left: Left Pain - part of body: Leg(groin)   Activity Tolerance Patient limited by pain   Patient Left in bed;with bed alarm set;with call bell/phone within reach;with family/visitor present   Nurse Communication Patient requests pain meds;Other (comment)(Pt requesting chocolate ice cream. RN cleared OT to give pt chocolate ice cream. )        Time: 2423-5361 OT Time Calculation (min): 18 min  Charges: OT General Charges $OT Visit: 1 Visit OT Treatments $Self Care/Home Management : 8-22 mins  Shara Blazing, M.S., OTR/L Ascom: [phone] 09/13/18, 1:26 PM

## 2018-09-13 NOTE — Progress Notes (Signed)
Subjective: Interval History: has complaints pain in the left groin. She underwent dressing change a few days ago and had done well. She is pending discharge.  Objective: Vital signs in last 24 hours: Temp:  [97.8 F (36.6 C)-98.2 F (36.8 C)] 98.2 F (36.8 C) (03/14 0426) Pulse Rate:  [74-96] 96 (03/14 0426) Resp:  [18] 18 (03/14 0426) BP: (89-95)/(36-40) 95/36 (03/14 0426) SpO2:  [97 %-100 %] 97 % (03/14 0426)  Intake/Output from previous day: 03/13 0701 - 03/14 0700 In: 2536.6 [I.V.:2536.6] Out: 100 [Urine:100] Intake/Output this shift: No intake/output data recorded.  General appearance: alert, cooperative and appears stated age Extremities: left BKA Incision/Wound:  Lab Results: Recent Labs    09/13/18 0458  WBC 11.1*  HGB 8.7*  HCT 28.7*  PLT 245   BMET No results for input(s): NA, K, CL, CO2, GLUCOSE, BUN, CREATININE, CALCIUM in the last 72 hours.  Studies/Results: Ct Angio Chest Pe W And/or Wo Contrast  Result Date: 08/21/2018 CLINICAL DATA:  Short of breath rule out pulmonary embolism. Recent left leg amputation. Diabetes. EXAM: CT ANGIOGRAPHY CHEST WITH CONTRAST TECHNIQUE: Multidetector CT imaging of the chest was performed using the standard protocol during bolus administration of intravenous contrast. Multiplanar CT image reconstructions and MIPs were obtained to evaluate the vascular anatomy. CONTRAST:  33mL OMNIPAQUE IOHEXOL 350 MG/ML SOLN COMPARISON:  Chest 08/21/2017 FINDINGS: Cardiovascular: Negative for pulmonary embolism. Pulmonary arteries normal in size. Cardiac enlargement with left ventricular dilatation. Moderate coronary calcification. Atherosclerotic aortic arch without aneurysm or dissection. Mediastinum/Nodes: Negative for mass or adenopathy. Lungs/Pleura: Small bilateral pleural effusions. Mild dependent bibasilar atelectasis. Negative for infiltrate or mass. Upper Abdomen: No acute abnormality. Musculoskeletal: No acute abnormality. Review of the  MIP images confirms the above findings. IMPRESSION: Negative for pulmonary embolism. Small bilateral pleural effusions with mild bibasilar atelectasis Left ventricular enlargement.  Coronary calcification. Aortic Atherosclerosis (ICD10-I70.0). Electronically Signed   By: Marlan Palau M.D.   On: 08/21/2018 16:03   Ct Pelvis W Contrast  Result Date: 09/05/2018 CLINICAL DATA:  Here with ex-boyfriend. Pt has had vascular surgery with balloons to legs. Wound opened. Recent amputation. Pt is diabetic. He has taken her back to vascular center 2 days ago for wound that he is concerned about. Was placed on a second round of antibiotics. Groin wound is swollen per family. Draining yellow fluid per family. No fever. Per family, infection under the skin of the buttocks. Severe pain in this region. EXAM: CT PELVIS WITH CONTRAST TECHNIQUE: Multidetector CT imaging of the pelvis was performed using the standard protocol following the bolus administration of intravenous contrast. CONTRAST:  80mL OMNIPAQUE IOHEXOL 300 MG/ML  SOLN COMPARISON:  06/07/2018 CT abdomen and pelvis FINDINGS: Urinary Tract: Distended urinary bladder. LOWER portions of the kidneys are unremarkable. Bowel: Moderate stool burden. No bowel dilatation or bowel wall thickening. Vascular/Lymphatic: Aorto bi-iliac stent grafts. Bilateral femoral artery stents. There is dense atherosclerosis of the native vessels. Along the LEFT proximal femoral artery there is an irregular heterogeneous gas and fluid collection with rim enhancement which measures 1.7 x 7.8 x 2.5 centimeters. Small amount of air is identified within the wound at the skin, consistent with the history of draining wound. There is no evidence for pseudoaneurysm or arteriovenous fistula. Reproductive:  The uterus is present.  No adnexal mass. Other:  No ascites.  Anterior abdominal wall is unremarkable. The LEFT gluteus maximus is enlarged and heterogeneous, measuring 5.4 centimeters, compared to  1.8 centimeters on the RIGHT. Vessels traverse the muscle and  considerations include inflammation/infection, or resolving hematoma. Mass is significantly less likely given the normal appearance on the December scan. Given the heterogeneity, is difficult to determine if there is a drainable fluid collection in this region. Significant diffuse body wall edema. Musculoskeletal: No acute osseous abnormality. IMPRESSION: 1. Heterogeneous gas and fluid collection adjacent to the LEFT femoral artery, suspicious for abscess. 2. Heterogeneous enlargement of the LEFT gluteus maximus, consistent with abscess, inflammation, or resolving hematoma. Given the heterogeneity, infection/inflammation is favored. Consider ultrasound to evaluate for drainable fluid collections. 3. Distended urinary bladder. 4. Vascular stents. Electronically Signed   By: Norva Pavlov M.D.   On: 09/05/2018 15:25   Mr Pelvis Wo Contrast  Result Date: 09/07/2018 CLINICAL DATA:  Evaluate gluteal fluid collection. Recent bilateral femoral endarterectomies with left groin infection. EXAM: MRI PELVIS WITHOUT CONTRAST TECHNIQUE: Multiplanar multisequence MR imaging of the pelvis was performed. No intravenous contrast was administered. COMPARISON:  CT pelvis dated September 05, 2018. FINDINGS: Urinary Tract: No abnormality visualized. Foley catheter within the decompressed bladder. Bowel:  Unremarkable visualized pelvic bowel loops. Vascular/Lymphatic: 2.6 x 3.2 x 5.0 cm heterogeneous fluid collection adjacent to the distal left common femoral artery, draining to the skin. No enlarged pelvic lymph nodes. Reproductive:  No mass or other significant abnormality. Other:  Small amount of presacral and pelvic fluid. Musculoskeletal: No suspicious marrow signal abnormality. No hip joint effusion. No acute fracture or dislocation. Prominent asymmetric enlargement of the left gluteus maximus muscle which is edematous and demonstrates patchy areas of T1 hyperintensity.  There is some ill-defined fluid within the muscle, but no discrete drainable fluid collection. There is also edema within the proximal left gluteus minimus and medius muscles, and the left adductor musculature. Diffuse anasarca with slightly asymmetric soft tissue swelling of the left proximal thigh. Small midline sacral decubitus ulcer. IMPRESSION: 1. Enlarged, edematous left gluteus maximus muscle with areas of patchy T1 hyperintensity, likely reflecting a combination of intramuscular hemorrhage and myositis. No discrete abscess or hematoma. 2. 5.0 cm heterogeneous fluid collection adjacent to the distal left femoral artery, concerning for abscess. 3. Small midline sacral decubitus ulcer without evidence of underlying osteomyelitis. 4. Diffuse anasarca with asymmetric soft tissue swelling of the left proximal thigh, which could reflect superimposed cellulitis. Correlate with physical exam. Electronically Signed   By: Obie Dredge M.D.   On: 09/07/2018 10:53   Dg Chest Port 1 View  Result Date: 08/21/2018 CLINICAL DATA:  Shortness of breath. EXAM: PORTABLE CHEST 1 VIEW COMPARISON:  Radiograph of July 20, 2018. FINDINGS: The heart size and mediastinal contours are within normal limits. No pneumothorax is noted. Minimal bibasilar subsegmental atelectasis or edema is noted with minimal pleural effusions. The visualized skeletal structures are unremarkable. IMPRESSION: Minimal bibasilar subsegmental atelectasis or edema is noted with minimal pleural effusions. Electronically Signed   By: Lupita Raider, M.D.   On: 08/21/2018 13:40   Anti-infectives: Anti-infectives (From admission, onward)   Start     Dose/Rate Route Frequency Ordered Stop   09/11/18 0000  linezolid (ZYVOX) 600 MG tablet     600 mg Oral 2 times daily 09/11/18 1233     09/10/18 1500  linezolid (ZYVOX) tablet 600 mg     600 mg Oral Every 12 hours 09/10/18 1457     09/08/18 1200  vancomycin (VANCOCIN) IVPB 750 mg/150 ml premix  Status:   Discontinued     750 mg 150 mL/hr over 60 Minutes Intravenous Every 24 hours 09/08/18 0740 09/10/18 1457   09/07/18  2100  ceFAZolin (ANCEF) IVPB 2g/100 mL premix     2 g 200 mL/hr over 30 Minutes Intravenous Every 8 hours 09/07/18 2029 09/08/18 0722   09/07/18 1830  ceFAZolin (ANCEF) IVPB 2g/100 mL premix  Status:  Discontinued     2 g 200 mL/hr over 30 Minutes Intravenous Every 8 hours 09/07/18 1807 09/07/18 2029   09/07/18 1500  vancomycin (VANCOCIN) 500 mg in sodium chloride 0.9 % 100 mL IVPB  Status:  Discontinued     500 mg 100 mL/hr over 60 Minutes Intravenous Every 24 hours 09/07/18 0727 09/08/18 0740   09/06/18 1300  vancomycin (VANCOCIN) 500 mg in sodium chloride 0.9 % 100 mL IVPB  Status:  Discontinued     500 mg 100 mL/hr over 60 Minutes Intravenous Every 24 hours 09/05/18 1637 09/06/18 1630   09/05/18 2000  metroNIDAZOLE (FLAGYL) IVPB 500 mg  Status:  Discontinued     500 mg 100 mL/hr over 60 Minutes Intravenous Every 8 hours 09/05/18 1940 09/08/18 1157   09/05/18 2000  ceFEPIme (MAXIPIME) 2 g in sodium chloride 0.9 % 100 mL IVPB  Status:  Discontinued     2 g 200 mL/hr over 30 Minutes Intravenous Every 24 hours 09/05/18 1637 09/07/18 1751   09/05/18 1945  ceFEPIme (MAXIPIME) 2 g in sodium chloride 0.9 % 100 mL IVPB  Status:  Discontinued     2 g 200 mL/hr over 30 Minutes Intravenous  Once 09/05/18 1940 09/05/18 1944   09/05/18 1945  vancomycin (VANCOCIN) IVPB 1000 mg/200 mL premix  Status:  Discontinued     1,000 mg 200 mL/hr over 60 Minutes Intravenous  Once 09/05/18 1940 09/05/18 1945   09/05/18 1130  piperacillin-tazobactam (ZOSYN) IVPB 3.375 g     3.375 g 100 mL/hr over 30 Minutes Intravenous  Once 09/05/18 1115 09/05/18 1307   09/05/18 1130  vancomycin (VANCOCIN) IVPB 1000 mg/200 mL premix     1,000 mg 200 mL/hr over 60 Minutes Intravenous  Once 09/05/18 1115 09/05/18 1410      Assessment/Plan: s/p Procedure(s): APPLICATION OF WOUND VAC ( VAC CHANGE )  (Left) stable wound vac.  Awaiting final change to home vac. Discussed with the nurse. She is to receive oral pain meds prior to dressing change. If she can not tolerate the dressing change then she may need to stay in the hospital until oral medications suffice.   LOS: 8 days   Leonides Sake 09/13/2018, 11:45 AM

## 2018-09-15 ENCOUNTER — Telehealth (INDEPENDENT_AMBULATORY_CARE_PROVIDER_SITE_OTHER): Payer: Self-pay | Admitting: Nurse Practitioner

## 2018-09-16 ENCOUNTER — Ambulatory Visit (INDEPENDENT_AMBULATORY_CARE_PROVIDER_SITE_OTHER): Payer: Medicare Other | Admitting: Vascular Surgery

## 2018-09-17 ENCOUNTER — Ambulatory Visit (INDEPENDENT_AMBULATORY_CARE_PROVIDER_SITE_OTHER): Payer: Medicare Other | Admitting: Vascular Surgery

## 2018-09-18 ENCOUNTER — Ambulatory Visit (INDEPENDENT_AMBULATORY_CARE_PROVIDER_SITE_OTHER): Payer: Medicare Other | Admitting: Nurse Practitioner

## 2018-09-21 ENCOUNTER — Emergency Department: Payer: Medicare Other

## 2018-09-21 ENCOUNTER — Other Ambulatory Visit: Payer: Self-pay

## 2018-09-21 ENCOUNTER — Encounter: Payer: Self-pay | Admitting: Emergency Medicine

## 2018-09-21 ENCOUNTER — Inpatient Hospital Stay
Admission: EM | Admit: 2018-09-21 | Discharge: 2018-09-28 | DRG: 863 | Disposition: A | Discharge status: Home Health | Payer: Medicare Other | Attending: Family Medicine | Admitting: Family Medicine

## 2018-09-21 DIAGNOSIS — E1151 Type 2 diabetes mellitus with diabetic peripheral angiopathy without gangrene: Secondary | ICD-10-CM | POA: Diagnosis present

## 2018-09-21 DIAGNOSIS — F419 Anxiety disorder, unspecified: Secondary | ICD-10-CM | POA: Diagnosis present

## 2018-09-21 DIAGNOSIS — T8149XA Infection following a procedure, other surgical site, initial encounter: Secondary | ICD-10-CM | POA: Diagnosis present

## 2018-09-21 DIAGNOSIS — Z89612 Acquired absence of left leg above knee: Secondary | ICD-10-CM

## 2018-09-21 DIAGNOSIS — L89150 Pressure ulcer of sacral region, unstageable: Secondary | ICD-10-CM | POA: Diagnosis not present

## 2018-09-21 DIAGNOSIS — Z7951 Long term (current) use of inhaled steroids: Secondary | ICD-10-CM

## 2018-09-21 DIAGNOSIS — E11621 Type 2 diabetes mellitus with foot ulcer: Secondary | ICD-10-CM | POA: Diagnosis not present

## 2018-09-21 DIAGNOSIS — L0231 Cutaneous abscess of buttock: Secondary | ICD-10-CM

## 2018-09-21 DIAGNOSIS — L97529 Non-pressure chronic ulcer of other part of left foot with unspecified severity: Secondary | ICD-10-CM | POA: Diagnosis not present

## 2018-09-21 DIAGNOSIS — Z89512 Acquired absence of left leg below knee: Secondary | ICD-10-CM | POA: Diagnosis not present

## 2018-09-21 DIAGNOSIS — Z1621 Resistance to vancomycin: Secondary | ICD-10-CM | POA: Diagnosis not present

## 2018-09-21 DIAGNOSIS — L089 Local infection of the skin and subcutaneous tissue, unspecified: Secondary | ICD-10-CM | POA: Diagnosis not present

## 2018-09-21 DIAGNOSIS — Z7901 Long term (current) use of anticoagulants: Secondary | ICD-10-CM

## 2018-09-21 DIAGNOSIS — E785 Hyperlipidemia, unspecified: Secondary | ICD-10-CM | POA: Diagnosis present

## 2018-09-21 DIAGNOSIS — Z808 Family history of malignant neoplasm of other organs or systems: Secondary | ICD-10-CM

## 2018-09-21 DIAGNOSIS — B9562 Methicillin resistant Staphylococcus aureus infection as the cause of diseases classified elsewhere: Secondary | ICD-10-CM | POA: Diagnosis not present

## 2018-09-21 DIAGNOSIS — F17210 Nicotine dependence, cigarettes, uncomplicated: Secondary | ICD-10-CM | POA: Diagnosis present

## 2018-09-21 DIAGNOSIS — L0291 Cutaneous abscess, unspecified: Secondary | ICD-10-CM

## 2018-09-21 DIAGNOSIS — K219 Gastro-esophageal reflux disease without esophagitis: Secondary | ICD-10-CM | POA: Diagnosis present

## 2018-09-21 DIAGNOSIS — Z95820 Peripheral vascular angioplasty status with implants and grafts: Secondary | ICD-10-CM | POA: Diagnosis not present

## 2018-09-21 DIAGNOSIS — D638 Anemia in other chronic diseases classified elsewhere: Secondary | ICD-10-CM | POA: Diagnosis present

## 2018-09-21 DIAGNOSIS — E11628 Type 2 diabetes mellitus with other skin complications: Secondary | ICD-10-CM | POA: Diagnosis not present

## 2018-09-21 DIAGNOSIS — Z91012 Allergy to eggs: Secondary | ICD-10-CM

## 2018-09-21 DIAGNOSIS — J449 Chronic obstructive pulmonary disease, unspecified: Secondary | ICD-10-CM | POA: Diagnosis present

## 2018-09-21 DIAGNOSIS — Z86718 Personal history of other venous thrombosis and embolism: Secondary | ICD-10-CM

## 2018-09-21 DIAGNOSIS — Z794 Long term (current) use of insulin: Secondary | ICD-10-CM

## 2018-09-21 DIAGNOSIS — Z7982 Long term (current) use of aspirin: Secondary | ICD-10-CM

## 2018-09-21 DIAGNOSIS — E46 Unspecified protein-calorie malnutrition: Secondary | ICD-10-CM | POA: Diagnosis present

## 2018-09-21 DIAGNOSIS — Z681 Body mass index (BMI) 19 or less, adult: Secondary | ICD-10-CM | POA: Diagnosis not present

## 2018-09-21 DIAGNOSIS — T8141XA Infection following a procedure, superficial incisional surgical site, initial encounter: Secondary | ICD-10-CM | POA: Diagnosis present

## 2018-09-21 DIAGNOSIS — L97519 Non-pressure chronic ulcer of other part of right foot with unspecified severity: Secondary | ICD-10-CM | POA: Diagnosis not present

## 2018-09-21 DIAGNOSIS — E119 Type 2 diabetes mellitus without complications: Secondary | ICD-10-CM

## 2018-09-21 DIAGNOSIS — I739 Peripheral vascular disease, unspecified: Secondary | ICD-10-CM | POA: Diagnosis present

## 2018-09-21 LAB — BASIC METABOLIC PANEL
Anion gap: 6 (ref 5–15)
BUN: 10 mg/dL (ref 8–23)
CO2: 28 mmol/L (ref 22–32)
Calcium: 7.7 mg/dL — ABNORMAL LOW (ref 8.9–10.3)
Chloride: 100 mmol/L (ref 98–111)
Creatinine, Ser: 0.56 mg/dL (ref 0.44–1.00)
GFR calc Af Amer: >60 mL/min (ref >60)
GFR calc non Af Amer: >60 mL/min (ref >60)
Glucose, Bld: 62 mg/dL — ABNORMAL LOW (ref 70–99)
POTASSIUM: 4.3 mmol/L (ref 3.5–5.1)
SODIUM: 134 mmol/L — AB (ref 135–145)

## 2018-09-21 LAB — CBC WITH DIFFERENTIAL/PLATELET
Abs Immature Granulocytes: 0.05 10*3/uL (ref 0.00–0.07)
Basophils Absolute: 0 10*3/uL (ref 0.0–0.1)
Basophils Relative: 0 %
Eosinophils Absolute: 0.1 10*3/uL (ref 0.0–0.5)
Eosinophils Relative: 1 %
HCT: 25.5 % — ABNORMAL LOW (ref 36.0–46.0)
Hemoglobin: 8 g/dL — ABNORMAL LOW (ref 12.0–15.0)
Immature Granulocytes: 1 %
Lymphocytes Relative: 16 %
Lymphs Abs: 1.7 10*3/uL (ref 0.7–4.0)
MCH: 30.2 pg (ref 26.0–34.0)
MCHC: 31.4 g/dL (ref 30.0–36.0)
MCV: 96.2 fL (ref 80.0–100.0)
Monocytes Absolute: 0.4 10*3/uL (ref 0.1–1.0)
Monocytes Relative: 4 %
Neutro Abs: 8.5 10*3/uL — ABNORMAL HIGH (ref 1.7–7.7)
Neutrophils Relative %: 78 %
PLATELETS: 182 10*3/uL (ref 150–400)
RBC: 2.65 MIL/uL — AB (ref 3.87–5.11)
RDW: 15.7 % — ABNORMAL HIGH (ref 11.5–15.5)
WBC: 10.8 10*3/uL — ABNORMAL HIGH (ref 4.0–10.5)
nRBC: 0 % (ref 0.0–0.2)

## 2018-09-21 LAB — GLUCOSE, CAPILLARY: Glucose-Capillary: 63 mg/dL — ABNORMAL LOW (ref 70–99)

## 2018-09-21 LAB — LACTIC ACID, PLASMA: Lactic Acid, Venous: 1.4 mmol/L (ref 0.5–1.9)

## 2018-09-21 MED ORDER — SODIUM CHLORIDE 0.9 % IV BOLUS
1000.0000 mL | Freq: Once | INTRAVENOUS | Status: AC
  Administered 2018-09-21: 1000 mL via INTRAVENOUS

## 2018-09-21 MED ORDER — OXYCODONE HCL 5 MG PO TABS
5.0000 mg | ORAL_TABLET | ORAL | Status: DC | PRN
  Administered 2018-09-21 – 2018-09-26 (×19): 5 mg via ORAL
  Filled 2018-09-21 (×20): qty 1

## 2018-09-21 MED ORDER — ACETAMINOPHEN 650 MG RE SUPP: 650.0000 mg | Freq: Four times a day (QID) | RECTAL | Status: DC | PRN

## 2018-09-21 MED ORDER — PIPERACILLIN-TAZOBACTAM 3.375 G IVPB
3.3750 g | Freq: Three times a day (TID) | INTRAVENOUS | Status: DC
  Administered 2018-09-22 – 2018-09-25 (×10): 3.375 g via INTRAVENOUS
  Filled 2018-09-21 (×10): qty 50

## 2018-09-21 MED ORDER — MOMETASONE FURO-FORMOTEROL FUM 200-5 MCG/ACT IN AERO
2.0000 | INHALATION_SPRAY | Freq: Two times a day (BID) | RESPIRATORY_TRACT | Status: DC
  Administered 2018-09-21 – 2018-09-28 (×13): 2 via RESPIRATORY_TRACT
  Filled 2018-09-21: qty 8.8

## 2018-09-21 MED ORDER — MIDODRINE HCL 5 MG PO TABS
10.0000 mg | ORAL_TABLET | Freq: Three times a day (TID) | ORAL | Status: DC
  Administered 2018-09-22 – 2018-09-28 (×18): 10 mg via ORAL
  Filled 2018-09-21 (×22): qty 2

## 2018-09-21 MED ORDER — PANTOPRAZOLE SODIUM 40 MG PO TBEC
40.0000 mg | DELAYED_RELEASE_TABLET | Freq: Every day | ORAL | Status: DC
  Administered 2018-09-22 – 2018-09-28 (×6): 40 mg via ORAL
  Filled 2018-09-21 (×7): qty 1

## 2018-09-21 MED ORDER — ATORVASTATIN CALCIUM 20 MG PO TABS
40.0000 mg | ORAL_TABLET | Freq: Every day | ORAL | Status: DC
  Administered 2018-09-22 – 2018-09-28 (×6): 40 mg via ORAL
  Filled 2018-09-21 (×7): qty 2

## 2018-09-21 MED ORDER — ALPRAZOLAM 0.5 MG PO TABS
0.5000 mg | ORAL_TABLET | Freq: Two times a day (BID) | ORAL | Status: DC | PRN
  Administered 2018-09-23 – 2018-09-25 (×3): 0.5 mg via ORAL
  Filled 2018-09-21 (×3): qty 1

## 2018-09-21 MED ORDER — ACETAMINOPHEN 325 MG PO TABS
650.0000 mg | ORAL_TABLET | Freq: Four times a day (QID) | ORAL | Status: DC | PRN
  Administered 2018-09-27: 650 mg via ORAL
  Filled 2018-09-21: qty 2

## 2018-09-21 MED ORDER — INSULIN ASPART 100 UNIT/ML ~~LOC~~ SOLN
0.0000 [IU] | Freq: Three times a day (TID) | SUBCUTANEOUS | Status: DC
  Administered 2018-09-22: 5 [IU] via SUBCUTANEOUS
  Administered 2018-09-22 – 2018-09-25 (×4): 2 [IU] via SUBCUTANEOUS
  Administered 2018-09-27: 1 [IU] via SUBCUTANEOUS
  Administered 2018-09-28: 2 [IU] via SUBCUTANEOUS
  Filled 2018-09-21 (×8): qty 1

## 2018-09-21 MED ORDER — RIVAROXABAN 10 MG PO TABS
10.0000 mg | ORAL_TABLET | Freq: Every evening | ORAL | Status: DC
  Administered 2018-09-21 – 2018-09-22 (×2): 10 mg via ORAL
  Filled 2018-09-21 (×3): qty 1

## 2018-09-21 MED ORDER — PIPERACILLIN-TAZOBACTAM 3.375 G IVPB 30 MIN
3.3750 g | Freq: Once | INTRAVENOUS | Status: AC
  Administered 2018-09-21: 3.375 g via INTRAVENOUS
  Filled 2018-09-21: qty 50

## 2018-09-21 MED ORDER — ONDANSETRON HCL 4 MG/2ML IJ SOLN
4.0000 mg | Freq: Four times a day (QID) | INTRAMUSCULAR | Status: DC | PRN
  Administered 2018-09-26: 4 mg via INTRAVENOUS
  Filled 2018-09-21: qty 2

## 2018-09-21 MED ORDER — VANCOMYCIN HCL IN DEXTROSE 1-5 GM/200ML-% IV SOLN
1000.0000 mg | Freq: Once | INTRAVENOUS | Status: AC
  Administered 2018-09-21: 1000 mg via INTRAVENOUS
  Filled 2018-09-21: qty 200

## 2018-09-21 MED ORDER — IOHEXOL 300 MG/ML  SOLN
80.0000 mL | Freq: Once | INTRAMUSCULAR | Status: AC | PRN
  Administered 2018-09-21: 80 mL via INTRAVENOUS

## 2018-09-21 MED ORDER — INSULIN ASPART 100 UNIT/ML ~~LOC~~ SOLN
0.0000 [IU] | Freq: Every day | SUBCUTANEOUS | Status: DC
  Administered 2018-09-23: 2 [IU] via SUBCUTANEOUS
  Filled 2018-09-21: qty 1

## 2018-09-21 MED ORDER — TIOTROPIUM BROMIDE MONOHYDRATE 18 MCG IN CAPS
18.0000 ug | ORAL_CAPSULE | Freq: Every day | RESPIRATORY_TRACT | Status: DC
  Administered 2018-09-22 – 2018-09-28 (×7): 18 ug via RESPIRATORY_TRACT
  Filled 2018-09-21 (×2): qty 5

## 2018-09-21 MED ORDER — ASPIRIN 81 MG PO CHEW
81.0000 mg | CHEWABLE_TABLET | Freq: Every day | ORAL | Status: DC
  Administered 2018-09-22 – 2018-09-23 (×2): 81 mg via ORAL
  Filled 2018-09-21 (×2): qty 1

## 2018-09-21 MED ORDER — ONDANSETRON HCL 4 MG PO TABS
4.0000 mg | ORAL_TABLET | Freq: Four times a day (QID) | ORAL | Status: DC | PRN
  Filled 2018-09-21 (×2): qty 1

## 2018-09-21 MED ORDER — LINEZOLID 600 MG/300ML IV SOLN
600.0000 mg | Freq: Two times a day (BID) | INTRAVENOUS | Status: DC
  Administered 2018-09-21 – 2018-09-24 (×6): 600 mg via INTRAVENOUS
  Filled 2018-09-21 (×8): qty 300

## 2018-09-21 NOTE — H&P (Signed)
[facility] [place] [facility]   PATIENT NAME: April [name]    MR#:  [id number]  DATE OF BIRTH:  1956-01-10  DATE OF ADMISSION:  09/21/2018  PRIMARY CARE PHYSICIAN: Mickel Fuchs, MD   REQUESTING/REFERRING PHYSICIAN: Scotty Court, MD  CHIEF COMPLAINT:   Chief Complaint  Patient presents with  . Wound Infection    HISTORY OF PRESENT ILLNESS:  April [name]  is a 63 y.o. female who presents with chief complaint as above.  Patient arrived to the ED via EMS after her home health nurse called with concerns that her surgical wound site is not healing.  Patient has been recently admitted here in the hospital for this problem.  Per IDs recommendations on discharge she was supposed to be getting IV linezolid due to wound culture growing VRE.  Work-up in the ED tonight shows development of abscess in the same area.  Surgery contacted by ED, the patient states she does not wish to have surgery.  Family could not be reached.  Surgical team recommended admission with hospitalist service for IV antibiotics until more clarification on the patient's wishes for possible intervention for this abscess can be obtained from her and/or family.  PAST MEDICAL HISTORY:   Past Medical History:  Diagnosis Date  . Anxiety   . Colon polyps   . COPD (chronic obstructive pulmonary disease) (HCC)   . Diabetes mellitus without complication (HCC)   . DVT (deep venous thrombosis) (HCC)   . DVT (deep venous thrombosis) (HCC)   . History of colonic polyps   . Hyperlipidemia   . Peripheral artery disease (HCC)   . Pulmonary nodules/lesions, multiple 08/2012   NEGATIVE PET SCAN  . Retroperitoneal abscess (HCC)    history of in 04/2013  . Tobacco abuse      PAST SURGICAL HISTORY:   Past Surgical History:  Procedure Laterality Date  . AMPUTATION Left 07/24/2018   Procedure: AMPUTATION BELOW KNEE;  Surgeon: Annice Needy, MD;  Location: [facility];  Service: Vascular;  Laterality:  Left;  . APPLICATION OF WOUND VAC Right 07/11/2018   Procedure: APPLICATION OF WOUND VAC;  Surgeon: Gwyneth Revels, DPM;  Location: [facility];  Service: Podiatry;  Laterality: Right;  . APPLICATION OF WOUND VAC Left 09/07/2018   Procedure: APPLICATION OF WOUND VAC;  Surgeon: Louisa Second, MD;  Location: [facility];  Service: Vascular;  Laterality: Left;  . APPLICATION OF WOUND VAC Left 09/10/2018   Procedure: APPLICATION OF WOUND VAC ( VAC CHANGE );  Surgeon: Annice Needy, MD;  Location: [facility];  Service: Vascular;  Laterality: Left;  . CHOLECYSTECTOMY    . COLONOSCOPY  05/2007  . ENDARTERECTOMY FEMORAL Bilateral 07/17/2018   Procedure: ENDARTERECTOMY FEMORAL - BILATERAL SFA STENT PLACEMENT - LEFT;  Surgeon: Annice Needy, MD;  Location: [facility];  Service: Vascular;  Laterality: Bilateral;  . FEMORAL ARTERY EXPLORATION  09/07/2018   Procedure: FEMORAL ARTERY EXPLORATION AND REVISION SAPHENOUS VEIN HARVEST FROM RIGHT LEG;  Surgeon: Louisa Second, MD;  Location: [facility];  Service: Vascular;;  . IRRIGATION AND DEBRIDEMENT FOOT Bilateral 05/25/2018   Procedure: IRRIGATION AND DEBRIDEMENT FOOT application wound vac left  foot;  Surgeon: Recardo Evangelist, DPM;  Location: [facility];  Service: Podiatry;  Laterality: Bilateral;  . IRRIGATION AND DEBRIDEMENT FOOT Right 07/11/2018   Procedure: FOOT DEBRIDEMENT;  Surgeon: Gwyneth Revels, DPM;  Location: [facility];  Service: Podiatry;  Laterality: Right;  . LOWER EXTREMITY ANGIOGRAPHY Left 05/26/2018   Procedure: Lower Extremity  Angiography with possible intervention;  Surgeon: Annice Needy, MD;  Location: [facility] INVASIVE CV LAB;  Service: Cardiovascular;  Laterality: Left;  . LOWER EXTREMITY ANGIOGRAPHY Right 05/27/2018   Procedure: Lower Extremity Angiography;  Surgeon: Renford Dills, MD;  Location: [facility] INVASIVE CV LAB;  Service: Cardiovascular;  Laterality: Right;  . LOWER EXTREMITY ANGIOGRAPHY Right 07/15/2018   Procedure: Lower Extremity Angiography;   Surgeon: Renford Dills, MD;  Location: [facility] INVASIVE CV LAB;  Service: Cardiovascular;  Laterality: Right;  . WOUND EXPLORATION Left 09/07/2018   Procedure: WOUND EXPLORATION;  Surgeon: Louisa Second, MD;  Location: [facility];  Service: Vascular;  Laterality: Left;     SOCIAL HISTORY:   Social History   Tobacco Use  . Smoking status: Current Every Day Smoker    Packs/day: 1.50    Years: 47.00    Pack years: 70.50    Types: Cigarettes  . Smokeless tobacco: Never Used  . Tobacco comment: Smoking History 2.5-started at age 59  Substance Use Topics  . Alcohol use: No    Alcohol/week: 0.0 standard drinks     FAMILY HISTORY:   Family History  Problem Relation Age of Onset  . Brain cancer Mother      DRUG ALLERGIES:   Allergies  Allergen Reactions  . Eggs Or Egg-Derived Products Diarrhea, Itching and Nausea And Vomiting    MEDICATIONS AT HOME:   Prior to Admission medications   Medication Sig Start Date End Date Taking? Authorizing Provider  albuterol (PROVENTIL HFA;VENTOLIN HFA) 108 (90 Base) MCG/ACT inhaler Inhale 2 puffs into the lungs every 6 (six) hours as needed for wheezing or shortness of breath.    [provider]  albuterol (PROVENTIL) (2.5 MG/3ML) 0.083% nebulizer solution Inhale 2.5 mg into the lungs every 6 (six) hours as needed for wheezing or shortness of breath.     [provider]  ALPRAZolam Prudy Feeler) 0.5 MG tablet Take 1 tablet (0.5 mg total) by mouth 2 (two) times daily as needed for anxiety. 07/26/18   Adrian Saran, MD  aspirin 81 MG chewable tablet Chew 81 mg by mouth daily. 05/30/17   [provider]  atorvastatin (LIPITOR) 40 MG tablet Take 40 mg by mouth daily. 10/24/17 10/24/18  [provider]  collagenase (SANTYL) ointment Apply topically daily. 08/25/18   Auburn Bilberry, MD  ferrous sulfate 325 (65 FE) MG tablet Take 1 tablet (325 mg total) by mouth 2 (two) times daily with a meal for 30 days. 09/13/18 10/13/18   Adrian Saran, MD  Fluticasone-Salmeterol (ADVAIR DISKUS) 250-50 MCG/DOSE AEPB Inhale 1 puff into the lungs 2 (two) times daily. 03/14/15 01/16/26  Beers, Charmayne Sheer, PA-C  gabapentin (NEURONTIN) 100 MG capsule Take 1 capsule (100 mg total) by mouth at bedtime. 07/26/18   Adrian Saran, MD  guaiFENesin-dextromethorphan (ROBITUSSIN DM) 100-10 MG/5ML syrup Take 5 mLs by mouth every 4 (four) hours as needed for cough. 04/11/18   Milagros Loll, MD  insulin glargine (LANTUS) 100 UNIT/ML injection Inject 0.08 mLs (8 Units total) into the skin at bedtime. 09/13/18   Adrian Saran, MD  ipratropium-albuterol (DUONEB) 0.5-2.5 (3) MG/3ML SOLN Take 3 mLs by nebulization 3 (three) times daily. 08/25/18   Auburn Bilberry, MD  linezolid (ZYVOX) 600 MG tablet Take 1 tablet (600 mg total) by mouth 2 (two) times daily. 09/11/18   Ihor Austin, MD  metFORMIN (GLUCOPHAGE) 500 MG tablet Take 500 mg by mouth daily with breakfast.  06/13/18   [provider]  midodrine (PROAMATINE) 10 MG  tablet Take 1 tablet (10 mg total) by mouth 3 (three) times daily with meals. 07/26/18   Adrian Saran, MD  pantoprazole (PROTONIX) 40 MG tablet Take 1 tablet (40 mg total) by mouth daily. 09/13/18   Adrian Saran, MD  polyethylene glycol (MIRALAX / GLYCOLAX) packet Take 17 g by mouth daily. 06/14/18   Altamese Dilling, MD  potassium chloride (K-DUR,KLOR-CON) 10 MEQ tablet Take by mouth. 06/13/18   [provider]  protein supplement shake (PREMIER PROTEIN) LIQD Take 325 mLs (11 oz total) by mouth 2 (two) times daily between meals. 06/14/18   Altamese Dilling, MD  rivaroxaban (XARELTO) 10 MG TABS tablet Take 10 mg by mouth every evening.    [provider]  SPIRIVA HANDIHALER 18 MCG inhalation capsule Place 18 mcg into inhaler and inhale daily.  05/19/18   [provider]  tamsulosin (FLOMAX) 0.4 MG CAPS capsule Take 1 capsule (0.4 mg total) by mouth daily. 06/14/18   Altamese Dilling, MD  vitamin C  (VITAMIN C) 500 MG tablet Take 1 tablet (500 mg total) by mouth 2 (two) times daily. 06/13/18   Altamese Dilling, MD    REVIEW OF SYSTEMS:  Review of Systems  Constitutional: Positive for malaise/fatigue. Negative for chills, fever and weight loss.  HENT: Negative for ear pain, hearing loss and tinnitus.   Eyes: Negative for blurred vision, double vision, pain and redness.  Respiratory: Negative for cough, hemoptysis and shortness of breath.   Cardiovascular: Negative for chest pain, palpitations, orthopnea and leg swelling.  Gastrointestinal: Negative for abdominal pain, constipation, diarrhea, nausea and vomiting.  Genitourinary: Negative for dysuria, frequency and hematuria.  Musculoskeletal: Negative for back pain, joint pain and neck pain.  Skin:       Left lower extremity surgical site wound  Neurological: Negative for dizziness, tremors, focal weakness and weakness.  Endo/Heme/Allergies: Negative for polydipsia. Does not bruise/bleed easily.  Psychiatric/Behavioral: Negative for depression. The patient is not nervous/anxious and does not have insomnia.      VITAL SIGNS:   Vitals:   09/21/18 1631 09/21/18 1818 09/21/18 1930 09/21/18 2030  BP:  110/61 118/73 (!) 112/58  Pulse:  (!) 108 94   Resp:  16    Temp:      TempSrc:      SpO2:  98% 95%   Weight: 39 kg     Height: 5\' 2"  (1.575 m)      Wt Readings from Last 3 Encounters:  09/21/18 39 kg  09/05/18 39 kg  08/21/18 41.3 kg    PHYSICAL EXAMINATION:  Physical Exam  Vitals reviewed. Constitutional: She is oriented to person, place, and time. She appears well-developed and well-nourished. No distress.  HENT:  Head: Normocephalic and atraumatic.  Mouth/Throat: Oropharynx is clear and moist.  Eyes: Pupils are equal, round, and reactive to light. Conjunctivae and EOM are normal. No scleral icterus.  Neck: Normal range of motion. Neck supple. No JVD present. No thyromegaly present.  Cardiovascular: Normal rate,  regular rhythm and intact distal pulses. Exam reveals no gallop and no friction rub.  No murmur heard. Respiratory: Effort normal and breath sounds normal. No respiratory distress. She has no wheezes. She has no rales.  GI: Soft. Bowel sounds are normal. She exhibits no distension. There is no abdominal tenderness.  Musculoskeletal: Normal range of motion.        General: No edema.     Comments: No arthritis, no gout  Lymphadenopathy:    She has no cervical adenopathy.  Neurological:  She is alert and oriented to person, place, and time. No cranial nerve deficit.  No dysarthria, no aphasia  Skin: Skin is warm and dry. No rash noted. No erythema.  Left lower extremity wound with exudate and erythema and induration  Psychiatric: She has a normal mood and affect. Her behavior is normal. Judgment and thought content normal.    LABORATORY PANEL:   CBC Recent Labs  Lab 09/21/18 1653  WBC 10.8*  HGB 8.0*  HCT 25.5*  PLT 182   ------------------------------------------------------------------------------------------------------------------  Chemistries  Recent Labs  Lab 09/21/18 1653  NA 134*  K 4.3  CL 100  CO2 28  GLUCOSE 62*  BUN 10  CREATININE 0.56  CALCIUM 7.7*   ------------------------------------------------------------------------------------------------------------------  Cardiac Enzymes No results for input(s): TROPONINI in the last 168 hours. ------------------------------------------------------------------------------------------------------------------  RADIOLOGY:  Ct Pelvis W Contrast  Result Date: 09/21/2018 CLINICAL DATA:  Worsening posterior pelvic wound. EXAM: CT PELVIS WITH CONTRAST TECHNIQUE: Multidetector CT imaging of the pelvis was performed using the standard protocol following the bolus administration of intravenous contrast. CONTRAST:  80mL OMNIPAQUE IOHEXOL 300 MG/ML  SOLN COMPARISON:  MRI 09/07/2018.  CT scan 09/05/2018. FINDINGS: Urinary Tract:  Foley catheter noted in the bladder lumen. Urine and gas are seen in the bladder lumen. Circumferential bladder wall thickening evident. Bowel: No small bowel or colonic dilatation within the visualized abdomen. Vascular/Lymphatic: Patient is status post aortic endograft placement. No pelvic sidewall lymphadenopathy. Reproductive: The uterus is unremarkable. There is no adnexal mass. Other:  Moderate volume intraperitoneal free fluid. Musculoskeletal: Diffuse body wall edema. A complex collection of fluid and gas is identified in the soft tissues of the posterior pelvis. This tracks from the subcutaneous fat superficial to the lower sacrum/coccyx to the left along the gluteus musculature although a lateral to the level of the greater trochanter of the proximal femur. This lesion shows irregular rim enhancement with internal enhancing septations. Imaging features are compatible with an abscess with involvement of the gluteus musculature and subcutaneous fat. Although difficult to measure due to the shape, this abscess is roughly 16 x 4 x 9 cm. Open wound noted in the left groin with a collection of rim enhancing gas and fluid measuring 4.5 x 2.6 x 2.2 cm. This rim enhancing fluid collection tracks deep and inferiorly along the left SFA stent. IMPRESSION: 1. Large complex rim enhancing collection of fluid and gas is identified superficial to and involving the left gluteus muscles. This abscess tracks from the midline lower sacrum/coccygeal region laterally to the level of the greater trochanter of the proximal left femur. Imaging features compatible with abscess measuring approximately 16 x 4 x 9 cm. 2. A second rim enhancing collection of fluid and gas is identified in the left groin region, deep to the open wound. This collection measures 5 x 3 x 2 cm and tracks deep to a position immediately adjacent to the left femoral artery stent. 3. Diffuse body wall edema. 4. Moderate volume intraperitoneal free fluid. 5.  Foley catheter tip is in the bladder lumen with circumferential bladder wall thickening. Residual urine noted within the bladder lumen and correlation for Foley malfunction suggested. Electronically Signed   By: Kennith Center M.D.   On: 09/21/2018 18:28    EKG:   Orders placed or performed during the hospital encounter of 09/05/18  . ED EKG 12-Lead  . ED EKG 12-Lead    IMPRESSION AND PLAN:  Principal Problem:   Surgical site infection -IV antibiotics, infectious disease consult.  Once  patient and her family are able to clarify if she would want intervention for possible abscess these consults could be ordered at that time Active Problems:   Diabetes (HCC) -sliding scale insulin coverage   COPD (chronic obstructive pulmonary disease) (HCC) -home dose inhalers   PAD (peripheral artery disease) (HCC) -continue home meds   HLD (hyperlipidemia) -home dose antilipid  Chart review performed and case discussed with ED provider. Labs, imaging and/or ECG reviewed by provider and discussed with patient/family. Management plans discussed with the patient and/or family.  DVT PROPHYLAXIS: Systemic anticoagulation  GI PROPHYLAXIS:  PPI   ADMISSION STATUS: Inpatient     CODE STATUS: Full Code Status History    Date Active Date Inactive Code Status Order ID Comments User Context   09/05/2018 1940 09/13/2018 2224 Full Code [id number]  Bertrum Sol, MD Inpatient   08/21/2018 1658 08/25/2018 1932 DNR [id number]  Adrian Saran, MD ED   08/21/2018 1656 08/21/2018 1658 DNR [id number]  Adrian Saran, MD ED   07/21/2018 1302 07/26/2018 2148 DNR [id number]  Glee Arvin, NP Inpatient   07/09/2018 1730 07/21/2018 1302 Partial Code [id number]  Mayo, Allyn Kenner, MD Inpatient   06/03/2018 0621 06/13/2018 2013 Full Code [id number]  Arnaldo Natal, MD Inpatient   05/23/2018 0253 06/01/2018 2158 Full Code [id number]  Cammy Copa, MD Inpatient   05/23/2018 0138 05/23/2018 0253 DNR [id number]  Cammy Copa, MD  ED   04/10/2018 1111 04/11/2018 1554 DNR [id number]  Milagros Loll, MD Inpatient   04/09/2018 1519 04/10/2018 1111 Full Code [id number]  Milagros Loll, MD Inpatient   04/09/2018 1337 04/09/2018 1519 DNR [id number]  Milagros Loll, MD Inpatient   04/09/2018 0012 04/09/2018 1337 Full Code [id number]  Oralia Manis, MD ED      TOTAL TIME TAKING CARE OF THIS PATIENT: 45 minutes.   Barney Drain 09/21/2018, 8:39 PM  [facility]  Office  [phone]  CC: Primary care physician; Mickel Fuchs, MD  Note:  This document was prepared using Dragon voice recognition software and may include unintentional dictation errors.

## 2018-09-21 NOTE — ED Provider Notes (Signed)
[facility] Emergency Department Provider Note  ____________________________________________  Time seen: Approximately 9:03 PM  I have reviewed the triage vital signs and the nursing notes.   HISTORY  Chief Complaint Wound Infection    Level 5 Caveat: Portions of the History and Physical including HPI and review of systems are unable to be completely obtained due to patient being a poor historian   HPI April [name] is a 63 y.o. female with a h/o COPD DVT and smoking, recently hospitalized due to sepsis from a soft tissue infection from a sacral decubitus ulcer and left femoral surgical site infection who is sent back to the ED by her visiting home nurse due to worsening appearance of her sacral decub.  Patient denies any chest pain or shortness of breath.  She complains of pain in her lower back which is moderate intensity, nonradiating, hurts to move, no alleviating factors.      Past Medical History:  Diagnosis Date  . Anxiety   . Colon polyps   . COPD (chronic obstructive pulmonary disease) (HCC)   . Diabetes mellitus without complication (HCC)   . DVT (deep venous thrombosis) (HCC)   . DVT (deep venous thrombosis) (HCC)   . History of colonic polyps   . Hyperlipidemia   . Peripheral artery disease (HCC)   . Pulmonary nodules/lesions, multiple 08/2012   NEGATIVE PET SCAN  . Retroperitoneal abscess (HCC)    history of in 04/2013  . Tobacco abuse      Patient Active Problem List   Diagnosis Date Noted  . Surgical wound infection 09/21/2018  . Sepsis (HCC) 09/05/2018  . PAD (peripheral artery disease) (HCC) 08/25/2018  . History of left below knee amputation (HCC) 08/25/2018  . Surgical site infection 08/25/2018  . Pressure injury of skin 08/22/2018  . COPD exacerbation (HCC) 08/21/2018  . Diabetic foot infection (HCC) 07/09/2018  . Acute on chronic respiratory failure with hypoxemia (HCC) 06/03/2018  . Acute respiratory failure with  hypoxia and hypercapnia (HCC)   . Acute osteomyelitis of right foot (HCC) 05/23/2018  . Protein-calorie malnutrition, severe 04/10/2018  . Diabetes (HCC) 04/08/2018  . COPD (chronic obstructive pulmonary disease) (HCC) 04/08/2018  . HLD (hyperlipidemia) 04/08/2018  . Diabetic foot ulcer (HCC) 04/08/2018  . 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 Elevated CEA 01/30/2014     Past Surgical History:  Procedure Laterality Date  . AMPUTATION Left 07/24/2018   Procedure: AMPUTATION BELOW KNEE;  Surgeon: Annice Needy, MD;  Location: [facility];  Service: Vascular;  Laterality: Left;  . APPLICATION OF WOUND VAC Right 07/11/2018   Procedure: APPLICATION OF WOUND VAC;  Surgeon: Gwyneth Revels, DPM;  Location: [facility];  Service: Podiatry;  Laterality: Right;  . APPLICATION OF WOUND VAC Left 09/07/2018   Procedure: APPLICATION OF WOUND VAC;  Surgeon: Louisa Second, MD;  Location: [facility];  Service: Vascular;  Laterality: Left;  . APPLICATION OF WOUND VAC Left 09/10/2018   Procedure: APPLICATION OF WOUND VAC ( VAC CHANGE );  Surgeon: Annice Needy, MD;  Location: [facility];  Service: Vascular;  Laterality: Left;  . CHOLECYSTECTOMY    . COLONOSCOPY  05/2007  . ENDARTERECTOMY FEMORAL Bilateral 07/17/2018   Procedure: ENDARTERECTOMY FEMORAL - BILATERAL SFA STENT PLACEMENT - LEFT;  Surgeon: Annice Needy, MD;  Location: [facility];  Service: Vascular;  Laterality: Bilateral;  . FEMORAL ARTERY EXPLORATION  09/07/2018   Procedure: FEMORAL ARTERY EXPLORATION AND REVISION SAPHENOUS VEIN  HARVEST FROM RIGHT LEG;  Surgeon: Louisa Second, MD;  Location: [facility];  Service: Vascular;;  . IRRIGATION AND DEBRIDEMENT FOOT Bilateral 05/25/2018   Procedure: IRRIGATION AND DEBRIDEMENT FOOT application wound vac left  foot;  Surgeon: Recardo Evangelist, DPM;  Location: [facility];  Service: Podiatry;  Laterality: Bilateral;  . IRRIGATION AND DEBRIDEMENT FOOT Right 07/11/2018    Procedure: FOOT DEBRIDEMENT;  Surgeon: Gwyneth Revels, DPM;  Location: [facility];  Service: Podiatry;  Laterality: Right;  . LOWER EXTREMITY ANGIOGRAPHY Left 05/26/2018   Procedure: Lower Extremity Angiography with possible intervention;  Surgeon: Annice Needy, MD;  Location: [facility] INVASIVE CV LAB;  Service: Cardiovascular;  Laterality: Left;  . LOWER EXTREMITY ANGIOGRAPHY Right 05/27/2018   Procedure: Lower Extremity Angiography;  Surgeon: Renford Dills, MD;  Location: [facility] INVASIVE CV LAB;  Service: Cardiovascular;  Laterality: Right;  . LOWER EXTREMITY ANGIOGRAPHY Right 07/15/2018   Procedure: Lower Extremity Angiography;  Surgeon: Renford Dills, MD;  Location: [facility] INVASIVE CV LAB;  Service: Cardiovascular;  Laterality: Right;  . WOUND EXPLORATION Left 09/07/2018   Procedure: WOUND EXPLORATION;  Surgeon: Louisa Second, MD;  Location: [facility];  Service: Vascular;  Laterality: Left;     Prior to Admission medications   Medication Sig Start Date End Date Taking? Authorizing Provider  albuterol (PROVENTIL HFA;VENTOLIN HFA) 108 (90 Base) MCG/ACT inhaler Inhale 2 puffs into the lungs every 6 (six) hours as needed for wheezing or shortness of breath.    [provider]  albuterol (PROVENTIL) (2.5 MG/3ML) 0.083% nebulizer solution Inhale 2.5 mg into the lungs every 6 (six) hours as needed for wheezing or shortness of breath.     [provider]  ALPRAZolam Prudy Feeler) 0.5 MG tablet Take 1 tablet (0.5 mg total) by mouth 2 (two) times daily as needed for anxiety. 07/26/18   Adrian Saran, MD  aspirin 81 MG chewable tablet Chew 81 mg by mouth daily. 05/30/17   [provider]  atorvastatin (LIPITOR) 40 MG tablet Take 40 mg by mouth daily. 10/24/17 10/24/18  [provider]  collagenase (SANTYL) ointment Apply topically daily. 08/25/18   Auburn Bilberry, MD  ferrous sulfate 325 (65 FE) MG tablet Take 1 tablet (325 mg total) by mouth 2 (two) times daily with a meal for 30  days. 09/13/18 10/13/18  Adrian Saran, MD  Fluticasone-Salmeterol (ADVAIR DISKUS) 250-50 MCG/DOSE AEPB Inhale 1 puff into the lungs 2 (two) times daily. 03/14/15 01/16/26  Beers, Charmayne Sheer, PA-C  gabapentin (NEURONTIN) 100 MG capsule Take 1 capsule (100 mg total) by mouth at bedtime. 07/26/18   Adrian Saran, MD  guaiFENesin-dextromethorphan (ROBITUSSIN DM) 100-10 MG/5ML syrup Take 5 mLs by mouth every 4 (four) hours as needed for cough. 04/11/18   Milagros Loll, MD  insulin glargine (LANTUS) 100 UNIT/ML injection Inject 0.08 mLs (8 Units total) into the skin at bedtime. 09/13/18   Adrian Saran, MD  ipratropium-albuterol (DUONEB) 0.5-2.5 (3) MG/3ML SOLN Take 3 mLs by nebulization 3 (three) times daily. 08/25/18   Auburn Bilberry, MD  linezolid (ZYVOX) 600 MG tablet Take 1 tablet (600 mg total) by mouth 2 (two) times daily. 09/11/18   Ihor Austin, MD  metFORMIN (GLUCOPHAGE) 500 MG tablet Take 500 mg by mouth daily with breakfast.  06/13/18   [provider]  midodrine (PROAMATINE) 10 MG tablet Take 1 tablet (10 mg total) by mouth 3 (three) times daily with meals. 07/26/18   Adrian Saran, MD  pantoprazole (PROTONIX) 40 MG tablet Take 1 tablet (40 mg total)  by mouth daily. 09/13/18   Adrian SaranMody, Sital, MD  polyethylene glycol (MIRALAX / GLYCOLAX) packet Take 17 g by mouth daily. 06/14/18   Altamese DillingVachhani, Vaibhavkumar, MD  potassium chloride (K-DUR,KLOR-CON) 10 MEQ tablet Take by mouth. 06/13/18   [provider]  protein supplement shake (PREMIER PROTEIN) LIQD Take 325 mLs (11 oz total) by mouth 2 (two) times daily between meals. 06/14/18   Altamese DillingVachhani, Vaibhavkumar, MD  rivaroxaban (XARELTO) 10 MG TABS tablet Take 10 mg by mouth every evening.    [provider]  SPIRIVA HANDIHALER 18 MCG inhalation capsule Place 18 mcg into inhaler and inhale daily.  05/19/18   [provider]  tamsulosin (FLOMAX) 0.4 MG CAPS capsule Take 1 capsule (0.4 mg total) by mouth daily. 06/14/18   Altamese DillingVachhani,  Vaibhavkumar, MD  vitamin C (VITAMIN C) 500 MG tablet Take 1 tablet (500 mg total) by mouth 2 (two) times daily. 06/13/18   Altamese DillingVachhani, Vaibhavkumar, MD     Allergies Eggs or egg-derived products   Family History  Problem Relation Age of Onset  . Brain cancer Mother     Social History Social History   Tobacco Use  . Smoking status: Current Every Day Smoker    Packs/day: 1.50    Years: 47.00    Pack years: 70.50    Types: Cigarettes  . Smokeless tobacco: Never Used  . Tobacco comment: Smoking History 2.5-started at age 63  Substance Use Topics  . Alcohol use: No    Alcohol/week: 0.0 standard drinks  . Drug use: No    Review of Systems Level 5 Caveat: Portions of the History and Physical including HPI and review of systems are unable to be completely obtained due to patient being a poor historian   Constitutional:   No known fever.  ENT:   No rhinorrhea. Cardiovascular:   No chest pain or syncope. Respiratory:   No dyspnea or cough. Gastrointestinal:   Negative for abdominal pain, vomiting and diarrhea.  Musculoskeletal: Low back/sacral pain as above ____________________________________________   PHYSICAL EXAM:  VITAL SIGNS: ED Triage Vitals  Enc Vitals Group     BP 09/21/18 1630 105/62     Pulse Rate 09/21/18 1630 (!) 118     Resp 09/21/18 1630 16     Temp 09/21/18 1630 98.2 F (36.8 C)     Temp Source 09/21/18 1630 Oral     SpO2 09/21/18 1630 99 %     Weight 09/21/18 1631 85 lb 15.7 oz (39 kg)     Height 09/21/18 1631 5\' 2"  (1.575 m)     Head Circumference --      Peak Flow --      Pain Score 09/21/18 1631 7     Pain Loc --      Pain Edu? --      Excl. in GC? --     Vital signs reviewed, nursing assessments reviewed.   Constitutional:   Alert and oriented to person and place.  Chronically ill-appearing, not in distress. Eyes:   Conjunctivae are normal. EOMI. PERRL. ENT      Head:   Normocephalic and atraumatic.      Nose:   No  congestion/rhinnorhea.       Mouth/Throat:   MMM, no pharyngeal erythema. No peritonsillar mass.       Neck:   No meningismus. Full ROM. Hematological/Lymphatic/Immunilogical:   No cervical lymphadenopathy. Cardiovascular: Sinus tachycardia, rate of 110. Symmetric bilateral radial and DP pulses.  No murmurs. Cap refill less than  2 seconds. Respiratory:   Normal respiratory effort without tachypnea/retractions. Breath sounds are clear and equal bilaterally. No wheezes/rales/rhonchi. Gastrointestinal:   Soft and nontender. Non distended. There is no CVA tenderness.  No rebound, rigidity, or guarding. Musculoskeletal:   Normal range of motion in all extremities. No joint effusions.  There is erythema tenderness and induration of the left hip just posterior to the greater trochanter, concerning for an inflammatory or infectious change. Sacral decubitus ulcer is about 4 cm in diameter with surrounding induration erythema and tenderness consistent with wound infection.  The wound is full of fibrinous exudate and unstageable.  Unable to express any frankly purulent drainage.  Does not probe deeply, no undermining. Neurologic:   Normal speech and language.  Motor grossly intact. No acute focal neurologic deficits are appreciated.  Skin:    Skin is warm, dry with sacral and left hip changes as above  ____________________________________________    LABS (pertinent positives/negatives) (all labs ordered are listed, but only abnormal results are displayed) Labs Reviewed  BASIC METABOLIC PANEL - Abnormal; Notable for the following components:      Result Value   Sodium 134 (*)    Glucose, Bld 62 (*)    Calcium 7.7 (*)    All other components within normal limits  CBC WITH DIFFERENTIAL/PLATELET - Abnormal; Notable for the following components:   WBC 10.8 (*)    RBC 2.65 (*)    Hemoglobin 8.0 (*)    HCT 25.5 (*)    RDW 15.7 (*)    Neutro Abs 8.5 (*)    All other components within normal limits   CULTURE, BLOOD (ROUTINE X 2)  CULTURE, BLOOD (ROUTINE X 2)  LACTIC ACID, PLASMA  LACTIC ACID, PLASMA   ____________________________________________   EKG    ____________________________________________    RADIOLOGY  Ct Pelvis W Contrast  Result Date: 09/21/2018 CLINICAL DATA:  Worsening posterior pelvic wound. EXAM: CT PELVIS WITH CONTRAST TECHNIQUE: Multidetector CT imaging of the pelvis was performed using the standard protocol following the bolus administration of intravenous contrast. CONTRAST:  80mL OMNIPAQUE IOHEXOL 300 MG/ML  SOLN COMPARISON:  MRI 09/07/2018.  CT scan 09/05/2018. FINDINGS: Urinary Tract: Foley catheter noted in the bladder lumen. Urine and gas are seen in the bladder lumen. Circumferential bladder wall thickening evident. Bowel: No small bowel or colonic dilatation within the visualized abdomen. Vascular/Lymphatic: Patient is status post aortic endograft placement. No pelvic sidewall lymphadenopathy. Reproductive: The uterus is unremarkable. There is no adnexal mass. Other:  Moderate volume intraperitoneal free fluid. Musculoskeletal: Diffuse body wall edema. A complex collection of fluid and gas is identified in the soft tissues of the posterior pelvis. This tracks from the subcutaneous fat superficial to the lower sacrum/coccyx to the left along the gluteus musculature although a lateral to the level of the greater trochanter of the proximal femur. This lesion shows irregular rim enhancement with internal enhancing septations. Imaging features are compatible with an abscess with involvement of the gluteus musculature and subcutaneous fat. Although difficult to measure due to the shape, this abscess is roughly 16 x 4 x 9 cm. Open wound noted in the left groin with a collection of rim enhancing gas and fluid measuring 4.5 x 2.6 x 2.2 cm. This rim enhancing fluid collection tracks deep and inferiorly along the left SFA stent. IMPRESSION: 1. Large complex rim enhancing  collection of fluid and gas is identified superficial to and involving the left gluteus muscles. This abscess tracks from the midline lower sacrum/coccygeal region laterally  to the level of the greater trochanter of the proximal left femur. Imaging features compatible with abscess measuring approximately 16 x 4 x 9 cm. 2. A second rim enhancing collection of fluid and gas is identified in the left groin region, deep to the open wound. This collection measures 5 x 3 x 2 cm and tracks deep to a position immediately adjacent to the left femoral artery stent. 3. Diffuse body wall edema. 4. Moderate volume intraperitoneal free fluid. 5. Foley catheter tip is in the bladder lumen with circumferential bladder wall thickening. Residual urine noted within the bladder lumen and correlation for Foley malfunction suggested. Electronically Signed   By: Kennith Center M.D.   On: 09/21/2018 18:28    ____________________________________________   PROCEDURES Procedures  ____________________________________________  DIFFERENTIAL DIAGNOSIS   Soft tissue abscess, cellulitis  CLINICAL IMPRESSION / ASSESSMENT AND PLAN / ED COURSE  Pertinent labs & imaging results that were available during my care of the patient were reviewed by me and considered in my medical decision making (see chart for details).    Patient presents with worsening clinical appearance of sacral wound concerning for developing wound infection/cellulitis.  Review of electronic medical record shows that she was recently admitted with sepsis secondary to wound infections of the sites and had surgical debridement.  Concern for complication or deeper infection, will check labs and CT scan of the pelvis.  Clinical Course as of Sep 20 2101  Wynelle Link Sep 21, 2018  1922 Discussed with vascular surgery Dr. Evie Lacks and general surgery Dr. Earlene Plater.  Dr. Earlene Plater will come evaluate the patient, anticipates likely operative management tonight due to the large gluteal  abscess.  I updated Dr. Evie Lacks regarding joint operative approach between vascular and general surgery so that she can also manage the abscess next to the left femoral artery stent as well.   [PS]    Clinical Course User Index [PS] Sharman Cheek, MD     ----------------------------------------- 9:11 PM on 09/21/2018 -----------------------------------------  Discussed with surgery after CT scan showed a large gluteal abscess.  Dr. Earlene Plater came to bedside and evaluated the patient and discussed with me afterward.  Currently, it sounds the patient is not ready to make a decision about consenting for surgery, and family members that she has designated as [employer] are not available despite Dr. Jinny Sanders attempts to reach them.  Plan for hospitalist admission, continue antibiotics until surgical plan can be addressed. ____________________________________________   FINAL CLINICAL IMPRESSION(S) / ED DIAGNOSES    Final diagnoses:  Pressure injury of sacral region, unstageable [facility])  Gluteal abscess     ED Discharge Orders    None      Portions of this note were generated with dragon dictation software. Dictation errors may occur despite best attempts at proofreading.   Sharman Cheek, MD 09/21/18 2112

## 2018-09-21 NOTE — ED Triage Notes (Signed)
PT to ER via EMS from home, called by home health nurse who states that pt has had a wound vac on a debrided wound and that it is no better.  Area dressed by home health prior to pt coming to ER.

## 2018-09-21 NOTE — Consult Note (Addendum)
SURGICAL CONSULTATION NOTE (initial) - cpt: 99255  HISTORY OF PRESENT ILLNESS (HPI):  63 y.o. female is referred by skilled nursing facility to [facility] ED for evaluation of Left buttock/sacral wound. Patient was recently admitted to [facility] for abscess involving her Left femoral artery stent and endarterectomy site. General surgery was consulted Lady Gary, 3/6) for sacral wound at that time, but there did not appear to be any drainable fluid collection. Orthopedic surgery was also consulted Allena Katz, 3/8) for Left gluteal pain and possible abscess, who recommended MRI, on which myositis/intramuscular hemorrhage without fluid collection were seen. IR percutaneous drainage was advised if focal fluid collection due to patient's history of non-healing infected wounds. 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 Though patient was today initially reluctant to respond, she reports she feels fine and denies sacral or Left hip pain, fever, CP, or SOB. However, when patient is asked whether she can roll onto one side or the other for exam, she says she can't lay on her Left side because it hurts. When asked about surgery, patient states "I'm frankly tired of surgery. I just want something to eat and for you to leave me alone". Patient asks that her granddaughter be contacted and that her granddaughter make decision for her any decisions regarding surgery.  Surgery is consulted by ED physician Dr. Scotty Court in this context for evaluation and management of sacral-Left buttock abscess.  PAST MEDICAL HISTORY (PMH):  Past Medical History:  Diagnosis Date  . Anxiety   . Colon polyps   . COPD (chronic obstructive pulmonary disease) (HCC)   . Diabetes mellitus without complication (HCC)   . DVT (deep venous thrombosis) (HCC)   . DVT (deep venous thrombosis) (HCC)   . History of colonic polyps   .  Hyperlipidemia   . Peripheral artery disease (HCC)   . Pulmonary nodules/lesions, multiple 08/2012   NEGATIVE PET SCAN  . Retroperitoneal abscess (HCC)    history of in 04/2013  . Tobacco abuse     PAST SURGICAL HISTORY (PSH):  Past Surgical History:  Procedure Laterality Date  . AMPUTATION Left 07/24/2018   Procedure: AMPUTATION BELOW KNEE;  Surgeon: Annice Needy, MD;  Location: [facility] ORS;  Service: Vascular;  Laterality: Left;  . APPLICATION OF WOUND VAC Right 07/11/2018   Procedure: APPLICATION OF WOUND VAC;  Surgeon: Gwyneth Revels, DPM;  Location: [facility] OR[facility];  Service: Podiatry;  Laterality: Right;  . APPLICATION OF WOUND VAC Left 09/07/2018   Procedure: APPLICATION OF WOUND VAC;  Surgeon: Louisa Second, MD;  Location: [facility] ORS;  Service: Vascular;  Laterality: Left;  . APPLICATION OF WOUND VAC Left 09/10/2018   Procedure: APPLICATION OF WOUND VAC ( VAC CHANGE );  Surgeon: Annice Needy, MD;  Location: [facility];  Service: Vascular;  Laterality: Left;  . CHOLECYSTECTOMY    . COLONOSCOPY  05/2007  . ENDARTERECTOMY FEMORAL Bilateral 07/17/2018   Procedure: ENDARTERECTOMY FEMORAL - BILATERAL SFA STENT PLACEMENT - LEFT;  Surgeon: Annice Needy, MD;  Location: [facility] OR[facility];  Service: Vascular;  Laterality: Bilateral;  . FEMORAL ARTERY EXPLORATION  09/07/2018   Procedure: FEMORAL ARTERY EXPLORATION AND REVISION SAPHENOUS VEIN HARVEST FROM RIGHT LEG;  Surgeon: Louisa Second, MD;  Location: [facility];  Service: Vascular;;  . IRRIGATION AND DEBRIDEMENT FOOT Bilateral 05/25/2018   Procedure: IRRIGATION AND DEBRIDEMENT FOOT application wound vac left  foot;  Surgeon: Recardo Evangelist, DPM;  Location: [facility];  Service: Podiatry;  Laterality: Bilateral;  . IRRIGATION AND DEBRIDEMENT FOOT Right 07/11/2018   Procedure: FOOT DEBRIDEMENT;  Surgeon: Gwyneth Revels, DPM;  Location: [facility];  Service: Podiatry;  Laterality: Right;  . LOWER EXTREMITY ANGIOGRAPHY Left 05/26/2018   Procedure: Lower Extremity  Angiography with possible intervention;  Surgeon: Annice Needy, MD;  Location: [facility] INVASIVE CV LAB;  Service: Cardiovascular;  Laterality: Left;  . LOWER EXTREMITY ANGIOGRAPHY Right 05/27/2018   Procedure: Lower Extremity Angiography;  Surgeon: Renford Dills, MD;  Location: [facility] INVASIVE CV LAB;  Service: Cardiovascular;  Laterality: Right;  . LOWER EXTREMITY ANGIOGRAPHY Right 07/15/2018   Procedure: Lower Extremity Angiography;  Surgeon: Renford Dills, MD;  Location: [facility] INVASIVE CV LAB;  Service: Cardiovascular;  Laterality: Right;  . WOUND EXPLORATION Left 09/07/2018   Procedure: WOUND EXPLORATION;  Surgeon: Louisa Second, MD;  Location: [facility] OR[facility];  Service: Vascular;  Laterality: Left;    MEDICATIONS:  Prior to Admission medications   Medication Sig Start Date End Date Taking? Authorizing Provider  albuterol (PROVENTIL HFA;VENTOLIN HFA) 108 (90 Base) MCG/ACT inhaler Inhale 2 puffs into the lungs every 6 (six) hours as needed for wheezing or shortness of breath.    [provider]  albuterol (PROVENTIL) (2.5 MG/3ML) 0.083% nebulizer solution Inhale 2.5 mg into the lungs every 6 (six) hours as needed for wheezing or shortness of breath.     [provider]  ALPRAZolam Prudy Feeler) 0.5 MG tablet Take 1 tablet (0.5 mg total) by mouth 2 (two) times daily as needed for anxiety. 07/26/18   Adrian Saran, MD  aspirin 81 MG chewable tablet Chew 81 mg by mouth daily. 05/30/17   [provider]  atorvastatin (LIPITOR) 40 MG tablet Take 40 mg by mouth daily. 10/24/17 10/24/18  [provider]  collagenase (SANTYL) ointment Apply topically daily. 08/25/18   Auburn Bilberry, MD  ferrous sulfate 325 (65 FE) MG tablet Take 1 tablet (325 mg total) by mouth 2 (two) times daily with a meal for 30 days. 09/13/18 10/13/18  Adrian Saran, MD  Fluticasone-Salmeterol (ADVAIR DISKUS) 250-50 MCG/DOSE AEPB Inhale 1 puff into the lungs 2 (two) times daily. 03/14/15 01/16/26  Beers, Charmayne Sheer,  PA-C  gabapentin (NEURONTIN) 100 MG capsule Take 1 capsule (100 mg total) by mouth at bedtime. 07/26/18   Adrian Saran, MD  guaiFENesin-dextromethorphan (ROBITUSSIN DM) 100-10 MG/5ML syrup Take 5 mLs by mouth every 4 (four) hours as needed for cough. 04/11/18   Milagros Loll, MD  insulin glargine (LANTUS) 100 UNIT/ML injection Inject 0.08 mLs (8 Units total) into the skin at bedtime. 09/13/18   Adrian Saran, MD  ipratropium-albuterol (DUONEB) 0.5-2.5 (3) MG/3ML SOLN Take 3 mLs by nebulization 3 (three) times daily. 08/25/18   Auburn Bilberry, MD  linezolid (ZYVOX) 600 MG tablet Take 1 tablet (600 mg total) by mouth 2 (two) times daily. 09/11/18   Ihor Austin, MD  metFORMIN (GLUCOPHAGE) 500 MG tablet Take 500 mg by mouth daily with breakfast.  06/13/18   [provider]  midodrine (PROAMATINE) 10 MG tablet Take 1 tablet (10 mg total) by mouth 3 (three) times daily with meals. 07/26/18   Adrian Saran, MD  pantoprazole (PROTONIX) 40 MG tablet Take 1 tablet (40 mg total) by mouth daily. 09/13/18   Adrian Saran, MD  polyethylene glycol (MIRALAX / GLYCOLAX) packet Take 17 g by mouth daily. 06/14/18   Altamese Dilling, MD  potassium chloride (K-DUR,KLOR-CON) 10 MEQ tablet Take by mouth. 06/13/18   [provider]  protein supplement shake (PREMIER PROTEIN) LIQD Take 325 mLs (11 oz total) by mouth 2 (two) times daily between meals. 06/14/18   Altamese Dilling, MD  rivaroxaban (XARELTO) 10 MG TABS tablet Take 10 mg by mouth every evening.    [provider]  SPIRIVA HANDIHALER 18 MCG inhalation capsule Place 18 mcg into inhaler and inhale daily.  05/19/18   [provider]  tamsulosin (FLOMAX) 0.4 MG CAPS capsule Take 1 capsule (0.4 mg total) by mouth daily. 06/14/18   Altamese Dilling, MD  vitamin C (VITAMIN C) 500 MG tablet Take 1 tablet (500 mg total) by mouth 2 (two) times daily. 06/13/18   Altamese Dilling, MD    ALLERGIES:  Allergies  Allergen  Reactions  . Eggs Or Egg-Derived Products Diarrhea, Itching and Nausea And Vomiting    SOCIAL HISTORY:  Social History   Socioeconomic History  . Marital status: Divorced    Spouse name: Not on file  . Number of children: Not on file  . Years of education: Not on file  . Highest education level: Not on file  Occupational History  . Not on file  Social Needs  . Financial resource strain: Not on file  . Food insecurity:    Worry: Not on file    Inability: Not on file  . Transportation needs:    Medical: Not on file    Non-medical: Not on file  Tobacco Use  . Smoking status: Current Every Day Smoker    Packs/day: 1.50    Years: 47.00    Pack years: 70.50    Types: Cigarettes  . Smokeless tobacco: Never Used  . Tobacco comment: Smoking History 2.5-started at age 54  Substance and Sexual Activity  . Alcohol use: No    Alcohol/week: 0.0 standard drinks  . Drug use: No  . Sexual activity: Not on file  Lifestyle  . Physical activity:    Days per week: Not on file    Minutes per session: Not on file  . Stress: Not on file  Relationships  . Social connections:    Talks on phone: Not on file    Gets together: Not on file    Attends religious service: Not on file    Active member of club or organization: Not on file    Attends meetings of clubs or organizations: Not on file    Relationship status: Not on file  . Intimate partner violence:    Fear of current or ex partner: Not on file    Emotionally abused: Not on file    Physically abused: Not on file    Forced sexual activity: Not on file  Other Topics Concern  . Not on file  Social History Narrative  . Not on file    The patient currently resides (home / rehab facility / nursing home): Home The patient normally is (ambulatory / bedbound): Non-ambulatory   FAMILY HISTORY:  Family History  Problem Relation Age of Onset  . Brain cancer Mother     REVIEW OF SYSTEMS:  Constitutional: denies weight loss, fever,  chills, or sweats  Eyes: denies any other vision changes, history of eye injury  ENT: denies sore throat, hearing problems  Respiratory: denies shortness of breath, wheezing  Cardiovascular: denies chest pain, palpitations  Gastrointestinal: denies abdominal pain, N/V, or diarrhea Genitourinary: denies burning with urination or urinary frequency Musculoskeletal: denies any other joint pains or cramps  Skin: denies any other rashes or skin discolorations  Neurological:  denies any other headache, dizziness, weakness  Psychiatric: denies any other depression, anxiety   All other review of systems were negative   VITAL SIGNS:  Temp:  [98.2 F (36.8 C)] 98.2 F (36.8 C) (03/22 1630) Pulse Rate:  [108-118] 108 (03/22 1818) Resp:  [16] 16 (03/22 1818) BP: (105-110)/(61-62) 110/61 (03/22 1818) SpO2:  [98 %-99 %] 98 % (03/22 1818) Weight:  [39 kg] 39 kg (03/22 1631)     Height: 5\' 2"  (157.5 cm) Weight: 39 kg BMI (Calculated): 15.72   INTAKE/OUTPUT:  This shift: No intake/output data recorded.  Last 2 shifts: @IOLAST2SHIFTS @   PHYSICAL EXAM:  Constitutional:  -- Debilitated and malnourished -- Arousable, in no apparent distress Eyes:  -- Pupils equally round and reactive to light  -- No scleral icterus, B/L no occular discharge Ear, nose, throat: -- Neck is FROM WNL -- No jugular venous distension  Pulmonary:  -- No wheezes or rhales -- Equal breath sounds bilaterally -- Breathing non-labored at rest Cardiovascular:  -- S1, S2 present  -- No pericardial rubs  Gastrointestinal:  -- Abdomen soft, nontender, non-distended, no guarding or rebound tenderness -- No abdominal masses appreciated, pulsatile or otherwise  Musculoskeletal and Integumentary:  -- Wounds or skin discoloration: Minimally tender to palpation quarter-sized sacral-coccygeal wound with fibrinous exudate and expressible purulent drainage, clear Left gluteal asymmetry, though non-tender to palpation and no  overlying erythema; Left anterior groin open wound with exposed femoral artery and associated fibrinous exudate and purulent fluid, likewise no surrounding erythema -- Extremities: exam limited due to patient refusal, though s/p Left BKA  Neurologic:  -- Motor function: patient refused further exam -- Sensation: patient refused further exam Psychiatric:  -- Notably frustrated and unclear whether patient understands her problem(s)  Labs:  CBC Latest Ref Rng & Units 09/21/2018 09/13/2018 09/10/2018  WBC 4.0 - 10.5 K/uL 10.8(H) 11.1(H) 10.6(H)  Hemoglobin 12.0 - 15.0 g/dL 8.0(L) 8.7(L) 8.8(L)  Hematocrit 36.0 - 46.0 % 25.5(L) 28.7(L) 28.5(L)  Platelets 150 - 400 K/uL 182 245 218   CMP Latest Ref Rng & Units 09/21/2018 09/10/2018 09/08/2018  Glucose 70 - 99 mg/dL 77(L) 99 390(Z)  BUN 8 - 23 mg/dL 10 14 17   Creatinine 0.44 - 1.00 mg/dL 0.09 2.33 0.07  Sodium 135 - 145 mmol/L 134(L) 135 134(L)  Potassium 3.5 - 5.1 mmol/L 4.3 4.3 4.4  Chloride 98 - 111 mmol/L 100 107 104  CO2 22 - 32 mmol/L 28 23 24   Calcium 8.9 - 10.3 mg/dL 7.7(L) 7.5(L) 7.5(L)  Total Protein 6.5 - 8.1 g/dL - - -  Total Bilirubin 0.3 - 1.2 mg/dL - - -  Alkaline Phos 38 - 126 U/L - - -  AST 15 - 41 U/L - - -  ALT 0 - 44 U/L - - -   Imaging studies:  CT Pelvis with Contrast (09/21/2018) - personally reviewed and compared to prior CT Pelvis (09/05/2018) 1. Large complex rim enhancing collection of fluid and gas is identified superficial to and involving the left gluteus muscles. This abscess tracks from the midline lower sacrum/coccygeal region laterally to the level of the greater trochanter of the proximal left femur. Imaging features compatible with abscess measuring approximately 16 x 4 x 9 cm. 2. A second rim enhancing collection of fluid and gas is identified in the left groin region, deep to the open wound. This collection measures 5 x 3 x 2 cm and tracks deep to a position immediately adjacent to the left femoral artery  stent.  3. Diffuse body wall edema. 4. Moderate volume intraperitoneal free fluid. 5. Foley catheter tip is in the bladder lumen with circumferential bladder wall thickening. Residual urine noted within the bladder lumen and correlation for Foley malfunction suggested.  Assessment/Plan: (ICD-10's: L02.31) 63 y.o. debilitated and chronically ill female with a large Left gluteal abscess, extending from her Left greater trochanter and incompletely draining very little (compared to size of abscess itself) via quarter-sized sacral-coccygeal pressure necrosis wound, complicated by infected open Left groin wound including exposed femoral artery s/p Left femoral arterial stent and endarterectomy as well as comorbidities including severe malnutrition, PAD s/p Left BKA, DM, HLD, COPD, pulmonary nodules/lesions, GERD, history of retroperitoneal abscess (2014), generalized anxiety disorder, and chronic ongoing tobacco abuse (smoking).   - CT results attempted to be discussed with patient  - attempted calling both patient's granddaughter and daughter without an answer from either  - unable to proceed with advised surgical drainage without consent or clear goals of care for that matter  - discussed patient with Dr. Anne Hahn, who agrees to admit with broad-spectrum IV antibiotics and both vascular surgery and palliative care consultations  - though not typically first line therapy for such a large complex soft tissue abscess and adjacent/associated pressure necrosis, may consider image-guided drainage if patient and her family truly refuse further surgical management  - please call if any questions or if family is reached/becomes available  - medical management of comorbidities as per medical team  - trend WBC, currently WNL  - DVT prophylaxis  All of the above findings and recommendations were discussed with the patient and ED physician, and attempts were made to answer any of patient's questions to her expressed  satisfaction.  Thank you for the opportunity to participate in this patient's care.   -- Scherrie Gerlach Earlene Plater, MD, RPVI [place]: [facility] - Partnering for exceptional care. Office: [phone]

## 2018-09-21 NOTE — ED Notes (Signed)
Unsuccessful attempt at blood collection; was able to establish patent peripheral IV

## 2018-09-21 NOTE — ED Notes (Signed)
Transporting pt to room 207 by this tech. Foley Cath emptied prior to transport.

## 2018-09-21 NOTE — ED Notes (Signed)
Cultures drawn by scott, tech

## 2018-09-21 NOTE — ED Notes (Signed)
ED TO INPATIENT HANDOFF REPORT  ED Nurse Name and Phone #:   Alix Stowers [phone]  S Name/Age/Gender April [name] 63 y.o. female Room/Bed: ED02A/ED02A  Code Status   Code Status: Prior  Home/SNF/Other Home Patient oriented to: self Is this baseline? Yes   Triage Complete: Triage complete  Chief Complaint Possible infection  Triage Note PT to ER via EMS from home, called by home health nurse who states that pt has had a wound vac on a debrided wound and that it is no better.  Area dressed by home health prior to pt coming to ER.     Allergies Allergies  Allergen Reactions  . Eggs Or Egg-Derived Products Diarrhea, Itching and Nausea And Vomiting    Level of Care/Admitting Diagnosis ED Disposition    ED Disposition Condition Comment   Admit  Hospital Area: [facility] [[id number]]  Level of Care: Med-Surg [16]  Diagnosis: Surgical wound infection [119147]  Admitting Physician: Oralia Manis [[id number]]  Attending Physician: Oralia Manis [phone]  Estimated length of stay: past midnight tomorrow  Certification:: I certify this patient will need inpatient services for at least 2 midnights  PT Class (Do Not Modify): Inpatient [101]  PT Acc Code (Do Not Modify): Private [1]       B Medical/Surgery History Past Medical History:  Diagnosis Date  . Anxiety   . Colon polyps   . COPD (chronic obstructive pulmonary disease) (HCC)   . Diabetes mellitus without complication (HCC)   . DVT (deep venous thrombosis) (HCC)   . DVT (deep venous thrombosis) (HCC)   . History of colonic polyps   . Hyperlipidemia   . Peripheral artery disease (HCC)   . Pulmonary nodules/lesions, multiple 08/2012   NEGATIVE PET SCAN  . Retroperitoneal abscess (HCC)    history of in 04/2013  . Tobacco abuse    Past Surgical History:  Procedure Laterality Date  . AMPUTATION Left 07/24/2018   Procedure: AMPUTATION BELOW KNEE;  Surgeon: Annice Needy, MD;  Location: [facility];  Service:  Vascular;  Laterality: Left;  . APPLICATION OF WOUND VAC Right 07/11/2018   Procedure: APPLICATION OF WOUND VAC;  Surgeon: Gwyneth Revels, DPM;  Location: [facility];  Service: Podiatry;  Laterality: Right;  . APPLICATION OF WOUND VAC Left 09/07/2018   Procedure: APPLICATION OF WOUND VAC;  Surgeon: Louisa Second, MD;  Location: [facility];  Service: Vascular;  Laterality: Left;  . APPLICATION OF WOUND VAC Left 09/10/2018   Procedure: APPLICATION OF WOUND VAC ( VAC CHANGE );  Surgeon: Annice Needy, MD;  Location: [facility];  Service: Vascular;  Laterality: Left;  . CHOLECYSTECTOMY    . COLONOSCOPY  05/2007  . ENDARTERECTOMY FEMORAL Bilateral 07/17/2018   Procedure: ENDARTERECTOMY FEMORAL - BILATERAL SFA STENT PLACEMENT - LEFT;  Surgeon: Annice Needy, MD;  Location: [facility];  Service: Vascular;  Laterality: Bilateral;  . FEMORAL ARTERY EXPLORATION  09/07/2018   Procedure: FEMORAL ARTERY EXPLORATION AND REVISION SAPHENOUS VEIN HARVEST FROM RIGHT LEG;  Surgeon: Louisa Second, MD;  Location: [facility];  Service: Vascular;;  . IRRIGATION AND DEBRIDEMENT FOOT Bilateral 05/25/2018   Procedure: IRRIGATION AND DEBRIDEMENT FOOT application wound vac left  foot;  Surgeon: Recardo Evangelist, DPM;  Location: [facility];  Service: Podiatry;  Laterality: Bilateral;  . IRRIGATION AND DEBRIDEMENT FOOT Right 07/11/2018   Procedure: FOOT DEBRIDEMENT;  Surgeon: Gwyneth Revels, DPM;  Location: [facility];  Service: Podiatry;  Laterality: Right;  . LOWER EXTREMITY ANGIOGRAPHY Left 05/26/2018  Procedure: Lower Extremity Angiography with possible intervention;  Surgeon: Annice Needy, MD;  Location: [facility] INVASIVE CV LAB;  Service: Cardiovascular;  Laterality: Left;  . LOWER EXTREMITY ANGIOGRAPHY Right 05/27/2018   Procedure: Lower Extremity Angiography;  Surgeon: Renford Dills, MD;  Location: [facility] INVASIVE CV LAB;  Service: Cardiovascular;  Laterality: Right;  . LOWER EXTREMITY ANGIOGRAPHY Right 07/15/2018   Procedure: Lower  Extremity Angiography;  Surgeon: Renford Dills, MD;  Location: [facility] INVASIVE CV LAB;  Service: Cardiovascular;  Laterality: Right;  . WOUND EXPLORATION Left 09/07/2018   Procedure: WOUND EXPLORATION;  Surgeon: Louisa Second, MD;  Location: [facility];  Service: Vascular;  Laterality: Left;     A IV Location/Drains/Wounds Patient Lines/Drains/Airways Status   Active Line/Drains/Airways    Name:   Placement date:   Placement time:   Site:   Days:   Peripheral IV 09/21/18 Right Arm   09/21/18    1650    Arm   less than 1   Negative Pressure Wound Therapy Groin Left   09/07/18    1733    -   14   Urethral Catheter Stephen, RN Double-lumen 16 Fr.   09/05/18    1634    Double-lumen   16   Incision (Closed) 09/07/18 Groin Left   09/07/18    1733     14   Incision (Closed) 09/07/18 Leg Left   09/07/18    1733     14   Incision (Closed) 09/10/18 Groin Left   09/10/18    1232     11   Incision (Closed) Pretibial Proximal;Right   -    -        Pressure Injury 04/09/18 wound on bottom of right foot   04/09/18    1020     165   Pressure Injury 08/21/18 Stage III -  Full thickness tissue loss. Subcutaneous fat may be visible but bone, tendon or muscle are NOT exposed.   08/21/18    2300     31   Wound / Incision (Open or Dehisced) 08/22/18 Incision - Open Leg Left BKA   08/22/18    1847    Leg   30   Wound / Incision (Open or Dehisced) 08/22/18 Incision - Open Groin Anterior;Left Incision   08/22/18    0730    Groin   30          Intake/Output Last 24 hours No intake or output data in the 24 hours ending 09/21/18 2109  Labs/Imaging Results for orders placed or performed during the hospital encounter of 09/21/18 (from the past 48 hour(s))  Lactic acid, plasma     Status: None   Collection Time: 09/21/18  4:53 PM  Result Value Ref Range   Lactic Acid, Venous 1.4 0.5 - 1.9 mmol/L    Comment: Performed at [facility], [address] [place], [place] [address]  Basic metabolic panel      Status: Abnormal   Collection Time: 09/21/18  4:53 PM  Result Value Ref Range   Sodium 134 (L) 135 - 145 mmol/L   Potassium 4.3 3.5 - 5.1 mmol/L   Chloride 100 98 - 111 mmol/L   CO2 28 22 - 32 mmol/L   Glucose, Bld 62 (L) 70 - 99 mg/dL   BUN 10 8 - 23 mg/dL   Creatinine, Ser 6.04 0.44 - 1.00 mg/dL   Calcium 7.7 (L) 8.9 - 10.3 mg/dL   GFR calc non Af  Amer >60 >60 mL/min   GFR calc Af Amer >60 >60 mL/min   Anion gap 6 5 - 15    Comment: Performed at [facility], [address] [place], [place] [address]  CBC with Differential     Status: Abnormal   Collection Time: 09/21/18  4:53 PM  Result Value Ref Range   WBC 10.8 (H) 4.0 - 10.5 K/uL   RBC 2.65 (L) 3.87 - 5.11 MIL/uL   Hemoglobin 8.0 (L) 12.0 - 15.0 g/dL   HCT 03.4 (L) 74.2 - 59.5 %   MCV 96.2 80.0 - 100.0 fL   MCH 30.2 26.0 - 34.0 pg   MCHC 31.4 30.0 - 36.0 g/dL   RDW 63.8 (H) 75.6 - 43.3 %   Platelets 182 150 - 400 K/uL   nRBC 0.0 0.0 - 0.2 %   Neutrophils Relative % 78 %   Neutro Abs 8.5 (H) 1.7 - 7.7 K/uL   Lymphocytes Relative 16 %   Lymphs Abs 1.7 0.7 - 4.0 K/uL   Monocytes Relative 4 %   Monocytes Absolute 0.4 0.1 - 1.0 K/uL   Eosinophils Relative 1 %   Eosinophils Absolute 0.1 0.0 - 0.5 K/uL   Basophils Relative 0 %   Basophils Absolute 0.0 0.0 - 0.1 K/uL   Immature Granulocytes 1 %   Abs Immature Granulocytes 0.05 0.00 - 0.07 K/uL    Comment: Performed at [facility], [address], [place], [place] [address]   Ct Pelvis W Contrast  Result Date: 09/21/2018 CLINICAL DATA:  Worsening posterior pelvic wound. EXAM: CT PELVIS WITH CONTRAST TECHNIQUE: Multidetector CT imaging of the pelvis was performed using the standard protocol following the bolus administration of intravenous contrast. CONTRAST:  65mL OMNIPAQUE IOHEXOL 300 MG/ML  SOLN COMPARISON:  MRI 09/07/2018.  CT scan 09/05/2018. FINDINGS: Urinary Tract: Foley catheter noted in the bladder lumen. Urine and gas are seen in the bladder  lumen. Circumferential bladder wall thickening evident. Bowel: No small bowel or colonic dilatation within the visualized abdomen. Vascular/Lymphatic: Patient is status post aortic endograft placement. No pelvic sidewall lymphadenopathy. Reproductive: The uterus is unremarkable. There is no adnexal mass. Other:  Moderate volume intraperitoneal free fluid. Musculoskeletal: Diffuse body wall edema. A complex collection of fluid and gas is identified in the soft tissues of the posterior pelvis. This tracks from the subcutaneous fat superficial to the lower sacrum/coccyx to the left along the gluteus musculature although a lateral to the level of the greater trochanter of the proximal femur. This lesion shows irregular rim enhancement with internal enhancing septations. Imaging features are compatible with an abscess with involvement of the gluteus musculature and subcutaneous fat. Although difficult to measure due to the shape, this abscess is roughly 16 x 4 x 9 cm. Open wound noted in the left groin with a collection of rim enhancing gas and fluid measuring 4.5 x 2.6 x 2.2 cm. This rim enhancing fluid collection tracks deep and inferiorly along the left SFA stent. IMPRESSION: 1. Large complex rim enhancing collection of fluid and gas is identified superficial to and involving the left gluteus muscles. This abscess tracks from the midline lower sacrum/coccygeal region laterally to the level of the greater trochanter of the proximal left femur. Imaging features compatible with abscess measuring approximately 16 x 4 x 9 cm. 2. A second rim enhancing collection of fluid and gas is identified in the left groin region, deep to the open wound. This collection measures 5 x 3 x 2 cm  and tracks deep to a position immediately adjacent to the left femoral artery stent. 3. Diffuse body wall edema. 4. Moderate volume intraperitoneal free fluid. 5. Foley catheter tip is in the bladder lumen with circumferential bladder wall  thickening. Residual urine noted within the bladder lumen and correlation for Foley malfunction suggested. Electronically Signed   By: Kennith Center M.D.   On: 09/21/2018 18:28    Pending Labs Unresulted Labs (From admission, onward)    Start     Ordered   09/21/18 1636  Culture, blood (routine x 2)  BLOOD CULTURE X 2,   STAT     09/21/18 1635   09/21/18 1636  Lactic acid, plasma  Now then every 2 hours,   STAT     09/21/18 1635   Signed and Held  Basic metabolic panel  Tomorrow morning,   R     Signed and Held   Signed and Held  CBC  Tomorrow morning,   R     Signed and Held          Vitals/Pain Today's Vitals   09/21/18 1631 09/21/18 1818 09/21/18 1930 09/21/18 2030  BP:  110/61 118/73 (!) 112/58  Pulse:  (!) 108 94   Resp:  16    Temp:      TempSrc:      SpO2:  98% 95%   Weight: 39 kg     Height: 5\' 2"  (1.575 m)     PainSc: 7        Isolation Precautions No active isolations  Medications Medications  piperacillin-tazobactam (ZOSYN) IVPB 3.375 g (has no administration in time range)  sodium chloride 0.9 % bolus 1,000 mL (0 mLs Intravenous Stopped 09/21/18 1818)  iohexol (OMNIPAQUE) 300 MG/ML solution 80 mL (80 mLs Intravenous Contrast Given 09/21/18 1815)  vancomycin (VANCOCIN) IVPB 1000 mg/200 mL premix (0 mg Intravenous Stopped 09/21/18 2034)  piperacillin-tazobactam (ZOSYN) IVPB 3.375 g (0 g Intravenous Stopped 09/21/18 1933)    Mobility non-ambulatory Low fall risk   Focused Assessments Cardiac Assessment Handoff:  Cardiac Rhythm: Sinus tachycardia Lab Results  Component Value Date   TROPONINI 0.40 (HH) 07/18/2018   No results found for: DDIMER Does the Patient currently have chest pain? No     R Recommendations: See Admitting Provider Note  Report given to:   Additional Notes:

## 2018-09-22 ENCOUNTER — Encounter: Payer: Self-pay | Admitting: *Deleted

## 2018-09-22 ENCOUNTER — Other Ambulatory Visit: Payer: Self-pay

## 2018-09-22 DIAGNOSIS — F17210 Nicotine dependence, cigarettes, uncomplicated: Secondary | ICD-10-CM

## 2018-09-22 DIAGNOSIS — Z91012 Allergy to eggs: Secondary | ICD-10-CM

## 2018-09-22 DIAGNOSIS — L97529 Non-pressure chronic ulcer of other part of left foot with unspecified severity: Secondary | ICD-10-CM

## 2018-09-22 DIAGNOSIS — L89150 Pressure ulcer of sacral region, unstageable: Secondary | ICD-10-CM

## 2018-09-22 DIAGNOSIS — E11621 Type 2 diabetes mellitus with foot ulcer: Secondary | ICD-10-CM

## 2018-09-22 DIAGNOSIS — B9562 Methicillin resistant Staphylococcus aureus infection as the cause of diseases classified elsewhere: Secondary | ICD-10-CM

## 2018-09-22 DIAGNOSIS — E11628 Type 2 diabetes mellitus with other skin complications: Secondary | ICD-10-CM

## 2018-09-22 DIAGNOSIS — Z89512 Acquired absence of left leg below knee: Secondary | ICD-10-CM

## 2018-09-22 DIAGNOSIS — Z1621 Resistance to vancomycin: Secondary | ICD-10-CM

## 2018-09-22 DIAGNOSIS — L97519 Non-pressure chronic ulcer of other part of right foot with unspecified severity: Secondary | ICD-10-CM

## 2018-09-22 DIAGNOSIS — L089 Local infection of the skin and subcutaneous tissue, unspecified: Secondary | ICD-10-CM

## 2018-09-22 DIAGNOSIS — E1151 Type 2 diabetes mellitus with diabetic peripheral angiopathy without gangrene: Secondary | ICD-10-CM

## 2018-09-22 DIAGNOSIS — Z95820 Peripheral vascular angioplasty status with implants and grafts: Secondary | ICD-10-CM

## 2018-09-22 LAB — BASIC METABOLIC PANEL
Anion gap: 8 (ref 5–15)
BUN: 8 mg/dL (ref 8–23)
CO2: 24 mmol/L (ref 22–32)
Calcium: 7 mg/dL — ABNORMAL LOW (ref 8.9–10.3)
Chloride: 102 mmol/L (ref 98–111)
Creatinine, Ser: 0.5 mg/dL (ref 0.44–1.00)
GFR calc Af Amer: >60 mL/min (ref >60)
GFR calc non Af Amer: >60 mL/min (ref >60)
GLUCOSE: 239 mg/dL — AB (ref 70–99)
Potassium: 4.1 mmol/L (ref 3.5–5.1)
Sodium: 134 mmol/L — ABNORMAL LOW (ref 135–145)

## 2018-09-22 LAB — CBC
HCT: 24.7 % — ABNORMAL LOW (ref 36.0–46.0)
Hemoglobin: 7.9 g/dL — ABNORMAL LOW (ref 12.0–15.0)
MCH: 30.3 pg (ref 26.0–34.0)
MCHC: 32 g/dL (ref 30.0–36.0)
MCV: 94.6 fL (ref 80.0–100.0)
Platelets: 149 10*3/uL — ABNORMAL LOW (ref 150–400)
RBC: 2.61 MIL/uL — ABNORMAL LOW (ref 3.87–5.11)
RDW: 15.9 % — ABNORMAL HIGH (ref 11.5–15.5)
WBC: 8.1 10*3/uL (ref 4.0–10.5)
nRBC: 0 % (ref 0.0–0.2)

## 2018-09-22 LAB — GLUCOSE, CAPILLARY
Glucose-Capillary: 224 mg/dL — ABNORMAL HIGH (ref 70–99)
Glucose-Capillary: 168 mg/dL — ABNORMAL HIGH (ref 70–99)
Glucose-Capillary: 257 mg/dL — ABNORMAL HIGH (ref 70–99)
Glucose-Capillary: 73 mg/dL (ref 70–99)
Glucose-Capillary: 83 mg/dL (ref 70–99)

## 2018-09-22 LAB — MRSA PCR SCREENING: MRSA BY PCR: POSITIVE — AB

## 2018-09-22 MED ORDER — MUPIROCIN 2 % EX OINT
1.0000 "application " | TOPICAL_OINTMENT | Freq: Two times a day (BID) | CUTANEOUS | Status: AC
  Administered 2018-09-22 – 2018-09-26 (×10): 1 via NASAL
  Filled 2018-09-22: qty 22

## 2018-09-22 MED ORDER — VITAMIN C 500 MG PO TABS
500.0000 mg | ORAL_TABLET | Freq: Two times a day (BID) | ORAL | Status: DC
  Administered 2018-09-22 – 2018-09-28 (×13): 500 mg via ORAL
  Filled 2018-09-22 (×13): qty 1

## 2018-09-22 MED ORDER — FERROUS SULFATE 325 (65 FE) MG PO TABS
325.0000 mg | ORAL_TABLET | Freq: Two times a day (BID) | ORAL | Status: DC
  Administered 2018-09-22 – 2018-09-28 (×11): 325 mg via ORAL
  Filled 2018-09-22 (×11): qty 1

## 2018-09-22 MED ORDER — PREMIER PROTEIN SHAKE
11.0000 [oz_av] | Freq: Two times a day (BID) | ORAL | Status: DC
  Administered 2018-09-22 – 2018-09-25 (×3): 11 [oz_av] via ORAL

## 2018-09-22 MED ORDER — GABAPENTIN 100 MG PO CAPS
100.0000 mg | ORAL_CAPSULE | Freq: Every day | ORAL | Status: DC
  Administered 2018-09-22 – 2018-09-27 (×6): 100 mg via ORAL
  Filled 2018-09-22 (×6): qty 1

## 2018-09-22 MED ORDER — GUAIFENESIN-DM 100-10 MG/5ML PO SYRP: 5.0000 mL | ORAL_SOLUTION | ORAL | Status: DC | PRN

## 2018-09-22 MED ORDER — INSULIN GLARGINE 100 UNIT/ML ~~LOC~~ SOLN
8.0000 [IU] | Freq: Every day | SUBCUTANEOUS | Status: DC
  Administered 2018-09-22 – 2018-09-23 (×2): 8 [IU] via SUBCUTANEOUS
  Filled 2018-09-22 (×3): qty 0.08

## 2018-09-22 MED ORDER — CHLORHEXIDINE GLUCONATE CLOTH 2 % EX PADS
6.0000 | MEDICATED_PAD | Freq: Every day | CUTANEOUS | Status: AC
  Administered 2018-09-23: 6 via TOPICAL

## 2018-09-22 NOTE — Progress Notes (Addendum)
[employer] [facility] [place] [facility]   PATIENT NAME: April [name]    MR#:  [id number]  DATE OF BIRTH:  08-01-1955  SUBJECTIVE:  CHIEF COMPLAINT:   Chief Complaint  Patient presents with  . Wound Infection   Left hip pain. REVIEW OF SYSTEMS:  Review of Systems  Constitutional: Negative for chills, fever and malaise/fatigue.  HENT: Negative for sore throat.   Eyes: Negative for blurred vision and double vision.  Respiratory: Negative for cough, hemoptysis, shortness of breath, wheezing and stridor.   Cardiovascular: Negative for chest pain, palpitations, orthopnea and leg swelling.  Gastrointestinal: Negative for abdominal pain, blood in stool, diarrhea, melena, nausea and vomiting.  Genitourinary: Negative for dysuria, flank pain and hematuria.  Musculoskeletal: Positive for joint pain. Negative for back pain.  Skin: Negative for rash.  Neurological: Negative for dizziness, sensory change, focal weakness, seizures, loss of consciousness, weakness and headaches.  Endo/Heme/Allergies: Negative for polydipsia.  Psychiatric/Behavioral: Negative for depression. The patient is not nervous/anxious.     DRUG ALLERGIES:   Allergies  Allergen Reactions  . Eggs Or Egg-Derived Products Diarrhea, Itching and Nausea And Vomiting   VITALS:  Blood pressure 99/60, pulse (!) 110, temperature 98.4 F (36.9 C), temperature source Oral, resp. rate 18, height 5\' 1"  (1.549 m), weight 48.6 kg, SpO2 96 %. PHYSICAL EXAMINATION:  Physical Exam Constitutional:      General: She is not in acute distress.    Appearance: Normal appearance.  HENT:     Head: Normocephalic.     Mouth/Throat:     Mouth: Mucous membranes are moist.  Eyes:     General: No scleral icterus.    Conjunctiva/sclera: Conjunctivae normal.     Pupils: Pupils are equal, round, and reactive to light.  Neck:     Musculoskeletal: Normal range of motion and neck supple.     Vascular: No JVD.     Trachea: No  tracheal deviation.  Cardiovascular:     Rate and Rhythm: Normal rate and regular rhythm.     Heart sounds: Normal heart sounds. No murmur. No gallop.   Pulmonary:     Effort: Pulmonary effort is normal. No respiratory distress.     Breath sounds: Normal breath sounds. No wheezing or rales.  Abdominal:     General: Bowel sounds are normal. There is no distension.     Palpations: Abdomen is soft.     Tenderness: There is no abdominal tenderness. There is no rebound.  Musculoskeletal:        General: No tenderness.     Comments: Left thigh tenderness and swelling, in dressing.  Skin:    Findings: No erythema or rash.  Neurological:     Mental Status: She is alert and oriented to person, place, and time.     Cranial Nerves: No cranial nerve deficit.    LABORATORY PANEL:  Female CBC Recent Labs  Lab 09/22/18 0417  WBC 8.1  HGB 7.9*  HCT 24.7*  PLT 149*   ------------------------------------------------------------------------------------------------------------------ Chemistries  Recent Labs  Lab 09/22/18 0417  NA 134*  K 4.1  CL 102  CO2 24  GLUCOSE 239*  BUN 8  CREATININE 0.50  CALCIUM 7.0*   RADIOLOGY:  Ct Pelvis W Contrast  Result Date: 09/21/2018 CLINICAL DATA:  Worsening posterior pelvic wound. EXAM: CT PELVIS WITH CONTRAST TECHNIQUE: Multidetector CT imaging of the pelvis was performed using the standard protocol following the bolus administration of intravenous contrast. CONTRAST:  43mL OMNIPAQUE IOHEXOL 300 MG/ML  SOLN COMPARISON:  MRI 09/07/2018.  CT scan 09/05/2018. FINDINGS: Urinary Tract: Foley catheter noted in the bladder lumen. Urine and gas are seen in the bladder lumen. Circumferential bladder wall thickening evident. Bowel: No small bowel or colonic dilatation within the visualized abdomen. Vascular/Lymphatic: Patient is status post aortic endograft placement. No pelvic sidewall lymphadenopathy. Reproductive: The uterus is unremarkable. There is no  adnexal mass. Other:  Moderate volume intraperitoneal free fluid. Musculoskeletal: Diffuse body wall edema. A complex collection of fluid and gas is identified in the soft tissues of the posterior pelvis. This tracks from the subcutaneous fat superficial to the lower sacrum/coccyx to the left along the gluteus musculature although a lateral to the level of the greater trochanter of the proximal femur. This lesion shows irregular rim enhancement with internal enhancing septations. Imaging features are compatible with an abscess with involvement of the gluteus musculature and subcutaneous fat. Although difficult to measure due to the shape, this abscess is roughly 16 x 4 x 9 cm. Open wound noted in the left groin with a collection of rim enhancing gas and fluid measuring 4.5 x 2.6 x 2.2 cm. This rim enhancing fluid collection tracks deep and inferiorly along the left SFA stent. IMPRESSION: 1. Large complex rim enhancing collection of fluid and gas is identified superficial to and involving the left gluteus muscles. This abscess tracks from the midline lower sacrum/coccygeal region laterally to the level of the greater trochanter of the proximal left femur. Imaging features compatible with abscess measuring approximately 16 x 4 x 9 cm. 2. A second rim enhancing collection of fluid and gas is identified in the left groin region, deep to the open wound. This collection measures 5 x 3 x 2 cm and tracks deep to a position immediately adjacent to the left femoral artery stent. 3. Diffuse body wall edema. 4. Moderate volume intraperitoneal free fluid. 5. Foley catheter tip is in the bladder lumen with circumferential bladder wall thickening. Residual urine noted within the bladder lumen and correlation for Foley malfunction suggested. Electronically Signed   By: Kennith Center M.D.   On: 09/21/2018 18:28   ASSESSMENT AND PLAN:   Surgical site infection of left thigh. Continue IV zyvox and Zosyn, follow-up surgeon for  possible surgery.    Diabetes (HCC) -sliding scale insulin coverage, continue home Lantus 8 units at bedtime.   COPD (chronic obstructive pulmonary disease) (HCC) -home dose inhalers, stable.   PAD (peripheral artery disease) (HCC) -continue Xarelto and aspirin.   HLD (hyperlipidemia) -home dose antilipid Tobacco abuse.  Smoking cessation was counseled for 3 to 4 minutes, nicotine patch.  I called the patient daughter Ms. Lucius Conn, but nobody answered the phone. All the records are reviewed and case discussed with Care Management/Social Worker. Management plans discussed with the patient, family and they are in agreement.  CODE STATUS: Full Code  TOTAL TIME TAKING CARE OF THIS PATIENT: 33 minutes.   More than 50% of the time was spent in counseling/coordination of care: YES  POSSIBLE D/C IN >3 DAYS, DEPENDING ON CLINICAL CONDITION.   Shaune Pollack M.D on 09/22/2018 at 1:33 PM  Between 7am to 6pm - Pager - [phone]  After 6pm go to [email or web address] - Therapist, nutritional Hospitalists

## 2018-09-22 NOTE — Progress Notes (Signed)
[facility] SURGICAL PROGRESS NOTE (cpt [id number])  Hospital Day(s): 1.   Post op day(s):  Marland Kitchen   Interval History: Patient seen and examined, no acute events or new complaints overnight. Patient reports her butt is sore. No complaints of fever. She is resting in bed but remains a poor historian. When asked again about surgical management vs possible drain placement, she states she would be agreeable to a drain. She asks frequently about her family and when they're coming. No other complaints this morning.   Review of Systems:  Constitutional: denies fever Integumentary: + Left buttock abscess  ROS limited due to patient being poor historian and lack of family at bedside.    Vital signs in last 24 hours: [min-max] current  Temp:  [97.5 F (36.4 C)-98.4 F (36.9 C)] 98.4 F (36.9 C) (03/23 0628) Pulse Rate:  [94-118] 110 (03/23 0628) Resp:  [16-18] 18 (03/23 0628) BP: (90-118)/(55-73) 99/60 (03/23 0628) SpO2:  [95 %-99 %] 96 % (03/23 0628) Weight:  [39 kg-48.6 kg] 48.6 kg (03/22 2156)     Height: 5\' 1"  (154.9 cm) Weight: 48.6 kg BMI (Calculated): 20.26   Intake/Output this shift:  Total I/O In: 211.5 [IV Piggyback:211.5] Out: 0      Physical Exam:  Constitutional: alert, cooperative and no distress  HENT: normocephalic without obvious abnormality  Eyes: PERRL, EOM's grossly intact and symmetric  Respiratory: breathing non-labored at rest  Integumentary: Multiple santyl dressings in place, no overlaying erythema to the skin, no purulence    Labs:  CBC Latest Ref Rng & Units 09/22/2018 09/21/2018 09/13/2018  WBC 4.0 - 10.5 K/uL 8.1 10.8(H) 11.1(H)  Hemoglobin 12.0 - 15.0 g/dL 7.9(L) 8.0(L) 8.7(L)  Hematocrit 36.0 - 46.0 % 24.7(L) 25.5(L) 28.7(L)  Platelets 150 - 400 K/uL 149(L) 182 245   CMP Latest Ref Rng & Units 09/22/2018 09/21/2018 09/10/2018  Glucose 70 - 99 mg/dL 563(O) 75(I) 99  BUN 8 - 23 mg/dL 8 10 14   Creatinine 0.44 - 1.00 mg/dL 4.33 2.95 1.88  Sodium  135 - 145 mmol/L 134(L) 134(L) 135  Potassium 3.5 - 5.1 mmol/L 4.1 4.3 4.3  Chloride 98 - 111 mmol/L 102 100 107  CO2 22 - 32 mmol/L 24 28 23   Calcium 8.9 - 10.3 mg/dL 7.0(L) 7.7(L) 7.5(L)  Total Protein 6.5 - 8.1 g/dL - - -  Total Bilirubin 0.3 - 1.2 mg/dL - - -  Alkaline Phos 38 - 126 U/L - - -  AST 15 - 41 U/L - - -  ALT 0 - 44 U/L - - -     Imaging studies: No new pertinent imaging studies   Assessment/Plan: (ICD-10's: L02.31) 63 y.o. debilitated and chronically ill female with a large Left gluteal abscess, extending from her Left greater trochanter and incompletely draining very little (compared to size of abscess itself) via quarter-sized sacral-coccygeal pressure necrosis wound, complicated by infected open Left groin wound including exposed femoral artery s/p Left femoral arterial stent and endarterectomy as well as comorbidities including severe malnutrition, PAD s/p Left BKA, DM, HLD, COPD, pulmonary nodules/lesions, GERD, history of retroperitoneal abscess (2014), generalized anxiety disorder, and chronic ongoing tobacco abuse (smoking).   - Attempted to discuss POC with patient further this morning, however, patient participation is limited. If intervention were desired, attempting drain placement is the preferred option. Although this is typical not first line treatment, the patient would likely have a difficult healing from a major incision and debridement. Again, consent may be difficult to obtain as family  is not reachable.    - Continue with vascular surgery + palliative care consultation  - Continue IV ABx (Zosyn, Vancomycin), IVF, okay for diet but would need to be NPO for any intervention  - Trend WBC; currently normalized  - Pain control PRN    - medical management of comorbidities as per medical team   - DVT prophylaxis   All of the above findings and recommendations were discussed with the patient and the medical team, and all of patient's questions were answered to  her expressed satisfaction.  -- Lynden Oxford, PA-C [facility] 09/22/2018, 11:06 AM [phone] M-F: 7am - 4pm

## 2018-09-22 NOTE — Progress Notes (Signed)
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 The home nurse sent her back to the ED yesterday as the wound was not looking good No history from the patient.   Complicated medical history severe peripheral artery disease , foot wounds, DM, smoking She has had chronic foot wounds which were being managed by podiatrist- she has had MRSA infections in the wounds In May 27, 2018 she underwent  percutaneous transluminal angioplasty and stent placement inthe right common iliac artery ,left common iliac artery and angioplasty to the right superficial femoral artery, stent placement of the left external iliac artery and incision and drainage of the deep abscess and removal of the sesamoid bone of the first metatarsal head on the right foot by Dr.Cline  The wound culture had MRSAso was the urine culture.treated with VAnco and then doxy.  Admitted 07/10/18-07/26/18 for b/l foot infection 07/11/18 excisional debridement up to the muscle on the right foot ulceration and also excision of the distal first metatarsal right foot  07/17/18: significant plaque in bilateral common femoral, profunda femoris arteries and the left proximal SFA. Stent from the right SFA was protruding about a centimeter into the common femoral artery Left common femoral, profunda femoris, and superficial femoral artery endarterectomies, patch angioplasty 2. Right common femoralandprofunda femoris endarterectomies 3.Partial resection of right SFA stent  1/23 Left BKA MRSA infection treated with vanco IV until 08/08/18 08/14/18 seen as OP- both groin surgical site looked infected- put on keflex by NP 2/20-2/24/20  admitted with  COPD exacerbation  3/6 admitted with  dehiscence and purulent discharge from the surgical site of her femoral endarterectomy  On 09/07/18 she underwent Left groin wound exploration,sharpdebridement, removal of infectedfemoral endarterectomypatch angioplasty, commonfemoral endarterectomy,autologouspatch angioplasty with right below the knee greater saphenous vein, wound washout with Pulsavac irrigator withvancomycin solution, and application of wound VAC with silver sponge.  Culture from left groin during surgery showed MRSA, VRE,  Past Medical History:  Diagnosis Date  . Anxiety   . Colon polyps   . COPD (chronic obstructive pulmonary disease) (HCC)   . Diabetes mellitus without complication (HCC)   . DVT (deep venous thrombosis) (HCC)   . DVT (deep venous thrombosis) (HCC)   . History of colonic polyps   . Hyperlipidemia   . Peripheral artery disease (HCC)   . Pulmonary nodules/lesions, multiple 08/2012   NEGATIVE PET SCAN  . Retroperitoneal abscess (HCC)    history of in 04/2013  . Tobacco abuse     Past Surgical History:  Procedure Laterality Date  . AMPUTATION Left 07/24/2018   Procedure: AMPUTATION BELOW KNEE;  Surgeon: Annice Needy, MD;  Location: [facility] ORS;  Service: Vascular;  Laterality: Left;  . APPLICATION OF WOUND VAC Right 07/11/2018   Procedure: APPLICATION OF WOUND VAC;  Surgeon: Gwyneth Revels, DPM;  Location: [facility] ORS;  Service: Podiatry;  Laterality: Right;  . APPLICATION OF WOUND VAC Left 09/07/2018   Procedure: APPLICATION OF WOUND VAC;  Surgeon: Louisa Second, MD;  Location: [facility] ORS;  Service: Vascular;  Laterality: Left;  . APPLICATION OF WOUND VAC Left 09/10/2018   Procedure: APPLICATION OF WOUND VAC [facility] CHANGE );  Surgeon: Annice Needy, MD;  Location: [facility]  ORS;  Service: Vascular;  Laterality: Left;  . CHOLECYSTECTOMY    . COLONOSCOPY  05/2007  . ENDARTERECTOMY FEMORAL Bilateral 07/17/2018   Procedure: ENDARTERECTOMY FEMORAL - BILATERAL SFA  STENT PLACEMENT - LEFT;  Surgeon: Annice Needy, MD;  Location: [facility] ORS;  Service: Vascular;  Laterality: Bilateral;  . FEMORAL ARTERY EXPLORATION  09/07/2018   Procedure: FEMORAL ARTERY EXPLORATION AND REVISION SAPHENOUS VEIN HARVEST FROM RIGHT LEG;  Surgeon: Louisa Second, MD;  Location: [facility] ORS;  Service: Vascular;;  . IRRIGATION AND DEBRIDEMENT FOOT Bilateral 05/25/2018   Procedure: IRRIGATION AND DEBRIDEMENT FOOT application wound vac left  foot;  Surgeon: Recardo Evangelist, DPM;  Location: [facility] ORS;  Service: Podiatry;  Laterality: Bilateral;  . IRRIGATION AND DEBRIDEMENT FOOT Right 07/11/2018   Procedure: FOOT DEBRIDEMENT;  Surgeon: Gwyneth Revels, DPM;  Location: [facility] ORS;  Service: Podiatry;  Laterality: Right;  . LOWER EXTREMITY ANGIOGRAPHY Left 05/26/2018   Procedure: Lower Extremity Angiography with possible intervention;  Surgeon: Annice Needy, MD;  Location: [facility] INVASIVE CV LAB;  Service: Cardiovascular;  Laterality: Left;  . LOWER EXTREMITY ANGIOGRAPHY Right 05/27/2018   Procedure: Lower Extremity Angiography;  Surgeon: Renford Dills, MD;  Location: [facility] INVASIVE CV LAB;  Service: Cardiovascular;  Laterality: Right;  . LOWER EXTREMITY ANGIOGRAPHY Right 07/15/2018   Procedure: Lower Extremity Angiography;  Surgeon: Renford Dills, MD;  Location: [facility] INVASIVE CV LAB;  Service: Cardiovascular;  Laterality: Right;  . WOUND EXPLORATION Left 09/07/2018   Procedure: WOUND EXPLORATION;  Surgeon: Louisa Second, MD;  Location: [facility] ORS;  Service: Vascular;  Laterality: Left;    Social History   Socioeconomic History  . Marital status: Divorced    Spouse name: Not on file  . Number of children: 2  . Years of education: Not on file  . Highest education level: Not on file  Occupational History  . Occupation: Retired   Engineer, production  . Financial resource strain: Not hard at all  . Food insecurity:    Worry: Never true    Inability: Never true  . Transportation needs:     Medical: No    Non-medical: No  Tobacco Use  . Smoking status: Current Every Day Smoker    Packs/day: 1.50    Years: 47.00    Pack years: 70.50    Types: Cigarettes  . Smokeless tobacco: Never Used  . Tobacco comment: Smoking History 2.5-started at age 72  Substance and Sexual Activity  . Alcohol use: No    Alcohol/week: 0.0 standard drinks  . Drug use: No  . Sexual activity: Not Currently  Lifestyle  . Physical activity:    Days per week: Patient refused    Minutes per session: Patient refused  . Stress: Very much  Relationships  . Social connections:    Talks on phone: Patient refused    Gets together: Patient refused    Attends religious service: Patient refused    Active member of club or organization: Patient refused    Attends meetings of clubs or organizations: Patient refused    Relationship status: Patient refused  . Intimate partner violence:    Fear of current or ex partner: Patient refused    Emotionally abused: Patient refused    Physically abused: Patient refused    Forced sexual activity: Patient refused  Other Topics Concern  . Not on file  Social History Narrative  . Not on file    Family History  Problem Relation Age of Onset  . Brain cancer Mother  Allergies  Allergen Reactions  . Eggs Or Egg-Derived Products Diarrhea, Itching and Nausea And Vomiting   ? Current Facility-Administered Medications  Medication Dose Route Frequency Provider Last Rate Last Dose  . acetaminophen (TYLENOL) tablet 650 mg  650 mg Oral Q6H PRN Oralia Manis, MD       Or  . acetaminophen (TYLENOL) suppository 650 mg  650 mg Rectal Q6H PRN Oralia Manis, MD      . ALPRAZolam Prudy Feeler) tablet 0.5 mg  0.5 mg Oral BID PRN Oralia Manis, MD      . aspirin chewable tablet 81 mg  81 mg Oral Daily Oralia Manis, MD   81 mg at 09/22/18 4503  . atorvastatin (LIPITOR) tablet 40 mg  40 mg Oral Daily Oralia Manis, MD   40 mg at 09/22/18 8882  . insulin aspart (novoLOG) injection 0-5  Units  0-5 Units Subcutaneous QHS Oralia Manis, MD      . insulin aspart (novoLOG) injection 0-9 Units  0-9 Units Subcutaneous TID WC Oralia Manis, MD   5 Units at 09/22/18 1150  . linezolid (ZYVOX) IVPB 600 mg  600 mg Intravenous Rigoberto Noel, MD 150 mL/hr at 09/22/18 1024    . midodrine (PROAMATINE) tablet 10 mg  10 mg Oral TID WC Oralia Manis, MD   10 mg at 09/22/18 1151  . mometasone-formoterol (DULERA) 200-5 MCG/ACT inhaler 2 puff  2 puff Inhalation BID Oralia Manis, MD   2 puff at 09/22/18 1151  . ondansetron (ZOFRAN) tablet 4 mg  4 mg Oral Q6H PRN Oralia Manis, MD       Or  . ondansetron [facility]) injection 4 mg  4 mg Intravenous Q6H PRN Oralia Manis, MD      . oxyCODONE (Oxy IR/ROXICODONE) immediate release tablet 5 mg  5 mg Oral Q4H PRN Oralia Manis, MD   5 mg at 09/22/18 1040  . pantoprazole (PROTONIX) EC tablet 40 mg  40 mg Oral Daily Oralia Manis, MD   40 mg at 09/22/18 8003  . piperacillin-tazobactam (ZOSYN) IVPB 3.375 g  3.375 g Intravenous Willow Ora, MD 12.5 mL/hr at 09/22/18 0909 3.375 g at 09/22/18 0909  . rivaroxaban (XARELTO) tablet 10 mg  10 mg Oral QPM Oralia Manis, MD   10 mg at 09/21/18 2304  . tiotropium (SPIRIVA) inhalation capsule ([facility] use ONLY) 18 mcg  18 mcg Inhalation Daily Oralia Manis, MD   18 mcg at 09/22/18 1150     Abtx:  Anti-infectives (From admission, onward)   Start     Dose/Rate Route Frequency Ordered Stop   09/22/18 0000  piperacillin-tazobactam (ZOSYN) IVPB 3.375 g     3.375 g 12.5 mL/hr over 240 Minutes Intravenous Every 8 hours 09/21/18 2044     09/21/18 2230  linezolid (ZYVOX) IVPB 600 mg     600 mg 300 mL/hr over 60 Minutes Intravenous Every 12 hours 09/21/18 2210     09/21/18 1900  vancomycin (VANCOCIN) IVPB 1000 mg/200 mL premix     1,000 mg 200 mL/hr over 60 Minutes Intravenous  Once 09/21/18 1849 09/21/18 2034   09/21/18 1900  piperacillin-tazobactam (ZOSYN) IVPB 3.375 g     3.375 g 100 mL/hr over 30 Minutes  Intravenous  Once 09/21/18 1849 09/21/18 1933      REVIEW OF SYSTEMS:  NA ?  Objective:  VITALS:  BP 99/60   Pulse (!) 110   Temp 98.4 F (36.9 C) (Oral)   Resp 18   Ht 5\' 1"  (1.549 m)  Wt 48.6 kg   SpO2 96%   BMI 20.24 kg/m  PHYSICAL EXAM:  General: Awake, hard of hearing   no distress, chronically ill, pale Head: Normocephalic, without obvious abnormality, atraumatic. Eyes: Conjunctivae clear, anicteric sclerae. Pupils are equal, edema of eyelids Rt > left   Back: unstageable sacral decub of 3 cm   Left groin- linear wound with slough   Edema left thigh   Lungs: b/l air entry Heart: s1s2 Abdomen: Soft, edema lower abdominal wall Extremities: left BKA- stump healthy Left  Skin: No rashes or lesions. Or bruising Lymph: Cervical, supraclavicular normal. Neurologic: did not examine in detail Pertinent Labs Lab Results CBC    Component Value Date/Time   WBC 8.1 09/22/2018 0417   RBC 2.61 (L) 09/22/2018 0417   HGB 7.9 (L) 09/22/2018 0417   HGB 13.6 10/14/2014 0644   HCT 24.7 (L) 09/22/2018 0417   HCT 41.3 10/14/2014 0644   PLT 149 (L) 09/22/2018 0417   PLT 156 10/14/2014 0644   MCV 94.6 09/22/2018 0417   MCV 96 10/14/2014 0644   MCH 30.3 09/22/2018 0417   MCHC 32.0 09/22/2018 0417   RDW 15.9 (H) 09/22/2018 0417   RDW 13.8 10/14/2014 0644   LYMPHSABS 1.7 09/21/2018 1653   LYMPHSABS 2.7 10/14/2014 0644   MONOABS 0.4 09/21/2018 1653   MONOABS 0.5 10/14/2014 0644   EOSABS 0.1 09/21/2018 1653   EOSABS 0.3 10/14/2014 0644   BASOSABS 0.0 09/21/2018 1653   BASOSABS 0.0 10/14/2014 0644    CMP Latest Ref Rng & Units 09/22/2018 09/21/2018 09/10/2018  Glucose 70 - 99 mg/dL 161(W) 96(E) 99  BUN 8 - 23 mg/dL Creatinine 0.44 - 1.00 mg/dL 4.54 0.98 1.19  Sodium 135 - 145 mmol/L 134(L) 134(L) 135  Potassium 3.5 - 5.1 mmol/L 4.1 4.3 4.3  Chloride 98 - 111 mmol/L 102 100 107  CO2 22 - 32 mmol/L Calcium 8.9 - 10.3 mg/dL 7.0(L) 7.7(L) 7.5(L)   Total Protein 6.5 - 8.1 g/dL - - -  Total Bilirubin 0.3 - 1.2 mg/dL - - -  Alkaline Phos 38 - 126 U/L - - -  AST 15 - 41 U/L - - -  ALT 0 - 44 U/L - - -      Microbiology: Recent Results (from the past 240 hour(s))  Culture, blood (routine x 2)     Status: None (Preliminary result)   Collection Time: 09/21/18  4:53 PM  Result Value Ref Range Status   Specimen Description BLOOD BLOOD LEFT ARM  Final   Special Requests   Final    BOTTLES DRAWN AEROBIC AND ANAEROBIC Blood Culture adequate volume   Culture   Final    NO GROWTH < 24 HOURS Performed at [facility], [address] [place], [place] [address]    Report Status PENDING  Incomplete  Culture, blood (routine x 2)     Status: None (Preliminary result)   Collection Time: 09/21/18  4:53 PM  Result Value Ref Range Status   Specimen Description BLOOD LEFT ANTECUBITAL  Final   Special Requests   Final    BOTTLES DRAWN AEROBIC AND ANAEROBIC Blood Culture results may not be optimal due to an inadequate volume of blood received in culture bottles   Culture   Final    NO GROWTH < 24 HOURS Performed at [facility], [address] [place], [place] [address]    Report Status PENDING  Incomplete  IMAGING RESULTS: A complex collection of fluid and gas is identified in the soft tissues of the posterior pelvis. This tracks from the subcutaneous fat superficial to the lower sacrum/coccyx to the left along the gluteus musculature although a lateral to the level of the greater trochanter of the proximal femur. This lesion shows irregular rim enhancement with internal enhancing septations. Imaging features are compatible with an abscess with involvement of the gluteus musculature and subcutaneous fat. Although difficult to measure due to the shape, this abscess is roughly 16 x 4 x 9 cm.  Open wound noted in the left groin with a collection of rim enhancing gas and fluid measuring 4.5 x 2.6 x 2.2 cm. This rim  enhancing fluid collection tracks deep and inferiorly along the left SFA stent.  I have personally reviewed the films ? Impression/Recommendation MRSA/VRE iinfection of the left femoral graft/endarterectomy site -s/p explantation on 3/6. Wound still has slough and needs further debridement or santyl On linezolid/zosyn-  Watch closely platelet/Hb/ as linezolid will have to be changed to dapto if this drops further. Need surgical debridement- need to consult vascular  ?PAD - smoker- has had b/l femoral endarterectomy/stents  DM  Left BKA in Jan 2020- healed well  Rt foot surgical site healed ? ? ___________________________________________________ Discussed with her nurse

## 2018-09-22 NOTE — Progress Notes (Signed)
Patient has a pending outpatient Palliative referral for last admission. CMRN Bevelyn Ngo made aware. Dayna Barker BSN, RN, [facility] [phone]

## 2018-09-22 NOTE — TOC Initial Note (Signed)
Transition of Care [facility]) - Initial/Assessment Note    Patient Details  Name: April [name] MRN: [id number] Date of Birth: 29-Jul-1955  Transition of Care [facility]) CM/SW Contact:    Chapman Fitch, RN Phone Number: 09/22/2018, 3:06 PM  Clinical Narrative:        Patient admitted from home with infection of left thigh.  Patient lives at home with significant other and granddaughter.  In the home patient has WC, RW, BSC, shower seat, hospital bed and low air loss mattress.    Patient open with [facility] for RN, PT, aide, SW.  [place] with [facility] notified of discharge.   Previous admission patient was discharged home with a wound vac from [place].   [facility] rep to determine if vac is still in the home.    Expected Discharge Plan: Home w Home Health Services Barriers to Discharge: Continued Medical Work up   Patient Goals and CMS Choice        Expected Discharge Plan and Services Expected Discharge Plan: Home w Home Health Services   Discharge Planning Services: CM Consult   Living arrangements for the past 2 months: Single Family Home Expected Discharge Date: 09/26/18                     [facility] Agency: [facility]  Prior Living Arrangements/Services Living arrangements for the past 2 months: Single Family Home Lives with:: Significant Other, Relatives              Current home services: DME, Home RN, Home PT, Homehealth aide, Other (comment)(Home SW)    Activities of Daily Living Home Assistive Devices/Equipment: Dentures (specify type), CBG Meter, Wheelchair, Hospital bed ADL Screening (condition at time of admission) Patient's cognitive ability adequate to safely complete daily activities?: Yes Is the patient deaf or have difficulty hearing?: Yes Does the patient have difficulty seeing, even when wearing glasses/contacts?: Yes Does the patient have difficulty concentrating, remembering, or making decisions?: Yes Patient able to express need for assistance  with ADLs?: Yes Does the patient have difficulty dressing or bathing?: Yes Independently performs ADLs?: No Communication: Independent Is this a change from baseline?: Pre-admission baseline Dressing (OT): Dependent Is this a change from baseline?: Pre-admission baseline Grooming: Dependent Is this a change from baseline?: Pre-admission baseline Feeding: Needs assistance Is this a change from baseline?: Pre-admission baseline Bathing: Dependent Toileting: Dependent Is this a change from baseline?: Pre-admission baseline In/Out Bed: Dependent Is this a change from baseline?: Pre-admission baseline Walks in Home: Dependent Is this a change from baseline?: Pre-admission baseline Does the patient have difficulty walking or climbing stairs?: Yes Weakness of Legs: Both Weakness of Arms/Hands: None  Permission Sought/Granted                  Emotional Assessment              Admission diagnosis:  Gluteal abscess [L02.31] Pressure injury of sacral region, unstageable (HCC) [L89.150] Patient Active Problem List   Diagnosis Date Noted  . Surgical wound infection 09/21/2018  . Sepsis (HCC) 09/05/2018  . PAD (peripheral artery disease) (HCC) 08/25/2018  . History of left below knee amputation (HCC) 08/25/2018  . Surgical site infection 08/25/2018  . Pressure injury of skin 08/22/2018  . COPD exacerbation (HCC) 08/21/2018  . Diabetic foot infection (HCC) 07/09/2018  . Acute on chronic respiratory failure with hypoxemia (HCC) 06/03/2018  . Acute respiratory failure with hypoxia and hypercapnia (HCC)   . Acute osteomyelitis of right  foot (HCC) 05/23/2018  . Protein-calorie malnutrition, severe 04/10/2018  . Diabetes (HCC) 04/08/2018  . COPD (chronic obstructive pulmonary disease) (HCC) 04/08/2018  . HLD (hyperlipidemia) 04/08/2018  . Diabetic foot ulcer (HCC) 04/08/2018  . 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 Elevated CEA  01/30/2014   PCP:  Mickel Fuchs, MD Pharmacy:   [facility] - [place], [place] - [address] [place] [address] Phone: [phone] Fax: [phone]  [facility] - [place], [place] - [address] [place] [address] Phone: [phone] Fax: [phone]  [facility] [address] [place] - [address] [place] [address] Phone: [phone] Fax: [phone]     Social Determinants of Health (SDOH) Interventions    Readmission Risk Interventions Readmission Risk Prevention Plan 09/11/2018  Transportation Screening Complete  Medication Review (RN Care Manager) Complete  HRI or Home Care Consult Complete  Palliative Care Screening Complete  Skilled Nursing Facility Patient Refused  Some recent data might be hidden

## 2018-09-23 DIAGNOSIS — I739 Peripheral vascular disease, unspecified: Secondary | ICD-10-CM

## 2018-09-23 DIAGNOSIS — Z79899 Other long term (current) drug therapy: Secondary | ICD-10-CM

## 2018-09-23 DIAGNOSIS — E785 Hyperlipidemia, unspecified: Secondary | ICD-10-CM

## 2018-09-23 DIAGNOSIS — L0231 Cutaneous abscess of buttock: Secondary | ICD-10-CM

## 2018-09-23 LAB — GLUCOSE, CAPILLARY
Glucose-Capillary: 96 mg/dL (ref 70–99)
Glucose-Capillary: 117 mg/dL — ABNORMAL HIGH (ref 70–99)
Glucose-Capillary: 95 mg/dL (ref 70–99)
Glucose-Capillary: 203 mg/dL — ABNORMAL HIGH (ref 70–99)

## 2018-09-23 MED ORDER — OXYCODONE HCL 5 MG PO TABS
10.0000 mg | ORAL_TABLET | Freq: Once | ORAL | Status: AC
  Administered 2018-09-23: 10 mg via ORAL
  Filled 2018-09-23: qty 2

## 2018-09-23 MED ORDER — COLLAGENASE 250 UNIT/GM EX OINT
TOPICAL_OINTMENT | Freq: Every day | CUTANEOUS | Status: DC
  Administered 2018-09-23 – 2018-09-28 (×6): via TOPICAL
  Filled 2018-09-23: qty 30

## 2018-09-23 MED ORDER — SODIUM CHLORIDE 0.9 % IV SOLN
INTRAVENOUS | Status: AC
  Administered 2018-09-23 – 2018-09-24 (×2): via INTRAVENOUS

## 2018-09-23 MED ORDER — SODIUM CHLORIDE 0.9 % IV SOLN
INTRAVENOUS | Status: DC | PRN
  Administered 2018-09-23: 250 mL via INTRAVENOUS
  Administered 2018-09-23: 10 mL via INTRAVENOUS
  Administered 2018-09-24 – 2018-09-26 (×2): 1000 mL via INTRAVENOUS

## 2018-09-23 NOTE — Progress Notes (Addendum)
MD notified: Power of attorney documentation was found in the system. April [name] is listed as primary POA in case you have to get in contact with him at any point. The Dressing changed.

## 2018-09-23 NOTE — Progress Notes (Addendum)
MD notified: FYI, patient has swelling on R periorbital, and R hand swelling.

## 2018-09-23 NOTE — Progress Notes (Signed)
New orders to apply wound vac to groin. Patient had would vac during previous admission. Vac application to be applied at the bedside by RN. Medicated with one time pain medication dose.

## 2018-09-23 NOTE — Consult Note (Signed)
WOC Nurse wound consult note Reason for Consult:Patient known to our department from previous admissions Wound type:Surgical (Left AKA incision line well approximated), sacral Unstageable pressure injury and left groin wound, chronic, nonhealing Pressure Injury POA: Yes Measurement: Left groin: 7cm x 2.5cm x 2.2cm with pale pink, non healing wound bed. 80% pink, 20% yellow. Small to moderate drainage. Left AKA incision line: 12cm x 0.1cm with no depth. Closely approximated. No drainage. Sacral Unstageable Pressure Injury:  3cm x 2cm with no depth. 100% adherent yellow slough. Small amount of light yellow exudate Wound bed:AS described above Drainage (amount, consistency, odor) AS described above Periwound: unremarkable, intact. Dressing procedure/placement/frequency: Surgery has been in and ordered NPWT to left groin wound.  Bedside RN will apply every Tu/Th/Sat.   Left AKA incision is well approximated. Wound will be cleansed and dried every day and covered with a dry gauze dressing. Unstageable pressure injury will be treated with collagenase daily. A mattress replacement will be provided. Patient had a pressure redistribution heel boot in a previous admission and does not want another. I explain to her that we do not want a pressure injury on there right heel and she says she will "not get one".  WOC nursing team will not follow, but will remain available to this patient, the nursing and medical teams.  Please re-consult if needed. Thanks, Ladona Mow, MSN, RN, GNP, [place]  Pager# [phone]

## 2018-09-23 NOTE — Plan of Care (Signed)
The patient continues to have soft BP. Vascular service has been contacted. Surgery service has seen the patient today.  Problem: Education: Goal: Knowledge of General Education information will improve Description Including pain rating scale, medication(s)/side effects and non-pharmacologic comfort measures Outcome: Progressing   Problem: Health Behavior/Discharge Planning: Goal: Ability to manage health-related needs will improve Outcome: Progressing   Problem: Clinical Measurements: Goal: Ability to maintain clinical measurements within normal limits will improve Outcome: Progressing Goal: Will remain free from infection Outcome: Progressing Goal: Diagnostic test results will improve Outcome: Progressing Goal: Respiratory complications will improve Outcome: Progressing   Problem: Activity: Goal: Risk for activity intolerance will decrease Outcome: Progressing   Problem: Nutrition: Goal: Adequate nutrition will be maintained Outcome: Progressing   Problem: Coping: Goal: Level of anxiety will decrease Outcome: Progressing   Problem: Elimination: Goal: Will not experience complications related to bowel motility Outcome: Progressing Goal: Will not experience complications related to urinary retention Outcome: Progressing   Problem: Pain Managment: Goal: General experience of comfort will improve Outcome: Progressing   Problem: Safety: Goal: Ability to remain free from injury will improve Outcome: Progressing   Problem: Skin Integrity: Goal: Risk for impaired skin integrity will decrease Outcome: Progressing

## 2018-09-23 NOTE — Consult Note (Signed)
[facility] Vascular Consult Note  MRN : [id number]  April [name] is a 63 y.o. (April 04, 1956) female who presents with chief complaint of  Chief Complaint  Patient presents with  . Wound Infection   History of Present Illness:  The patient is a 64 year old female well known to our service with a past medical history of tobacco abuse, hyperlipidemia, diabetes, COPD, and peripheral artery disease s/p multiple endovascular interventions to the lower extremities, left femoral endarterectomy, and left femoral endarterectomy wound debridement with VAC therapy placement.  Patient is very lethargic during this consult only responsive to pain (dressing removal).  Information for this consult has been taking from previous EPIC notes.  Patient seems to be resting comfortably in bed during examination.    Patient was recently hospitalized approximately two weeks ago for a left groin / sacral wound. Her wound was debrided and underwent one VAC change in the OR. Discharge recommendations were continued VAC therapy changes every other day. Patient had not followed up as an outpatient before her readmission to the hospital. Patient was sent to ED by visiting nurse due to worsening wound status.   Patient seen today without VAC in place. Unsure as to when [facility] therapy was stopped.   09/21/18 CT Pelvis:  1. Large complex rim enhancing collection of fluid and gas is identified superficial to and involving the left gluteus muscles. This abscess tracks from the midline lower sacrum/coccygeal region laterally to the level of the greater trochanter of the proximal left femur. Imaging features compatible with abscess measuring approximately 16 x 4 x 9 cm. 2. A second rim enhancing collection of fluid and gas is identified in the left groin region, deep to the open wound. This collection measures 5 x 3 x 2 cm and tracks deep to a position immediately adjacent to the left femoral artery  stent. 3. Diffuse body wall edema. 4. Moderate volume intraperitoneal free fluid. 5. Foley catheter tip is in the bladder lumen with circumferential bladder wall thickening. Residual urine noted within the bladder lumen and correlation for Foley malfunction suggested.  Vascular Surgery was consulted by Dr. Imogene Burn for further vascular recommendations (Left BKA Stump / Groin wound with fluid collection).  Current Facility-Administered Medications  Medication Dose Route Frequency Provider Last Rate Last Dose  . 0.9 %  sodium chloride infusion   Intravenous PRN Shaune Pollack, MD   Stopped at 09/23/18 [phone]  . 0.9 %  sodium chloride infusion   Intravenous Continuous Shaune Pollack, MD 75 mL/hr at 09/23/18 1418    . acetaminophen (TYLENOL) tablet 650 mg  650 mg Oral Q6H PRN Oralia Manis, MD       Or  . acetaminophen (TYLENOL) suppository 650 mg  650 mg Rectal Q6H PRN Oralia Manis, MD      . ALPRAZolam Prudy Feeler) tablet 0.5 mg  0.5 mg Oral BID PRN Oralia Manis, MD      . atorvastatin (LIPITOR) tablet 40 mg  40 mg Oral Daily Oralia Manis, MD   40 mg at 09/23/18 0851  . Chlorhexidine Gluconate Cloth 2 % PADS 6 each  6 each Topical Q0600 Shaune Pollack, MD   6 each at 09/23/18 [phone]  . collagenase (SANTYL) ointment   Topical Daily Shaune Pollack, MD      . ferrous sulfate tablet 325 mg  325 mg Oral BID WC Shaune Pollack, MD   325 mg at 09/23/18 0851  . gabapentin (NEURONTIN) capsule 100 mg  100 mg Oral QHS Shaune Pollack,  MD   100 mg at 09/22/18 2130  . guaiFENesin-dextromethorphan (ROBITUSSIN DM) 100-10 MG/5ML syrup 5 mL  5 mL Oral Q4H PRN Shaune Pollack, MD      . insulin aspart (novoLOG) injection 0-5 Units  0-5 Units Subcutaneous QHS Oralia Manis, MD      . insulin aspart (novoLOG) injection 0-9 Units  0-9 Units Subcutaneous TID WC Oralia Manis, MD   5 Units at 09/22/18 1150  . insulin glargine (LANTUS) injection 8 Units  8 Units Subcutaneous QHS Shaune Pollack, MD   8 Units at 09/22/18 2132  . linezolid (ZYVOX) IVPB 600 mg  600  mg Intravenous Rigoberto Noel, MD   Stopped at 09/23/18 1057  . midodrine (PROAMATINE) tablet 10 mg  10 mg Oral TID WC Oralia Manis, MD   10 mg at 09/23/18 1109  . mometasone-formoterol (DULERA) 200-5 MCG/ACT inhaler 2 puff  2 puff Inhalation BID Oralia Manis, MD   2 puff at 09/23/18 [phone]  . mupirocin ointment (BACTROBAN) 2 % 1 application  1 application Nasal BID Shaune Pollack, MD   1 application at 09/23/18 [phone]  . ondansetron (ZOFRAN) tablet 4 mg  4 mg Oral Q6H PRN Oralia Manis, MD       Or  . ondansetron [facility]) injection 4 mg  4 mg Intravenous Q6H PRN Oralia Manis, MD      . oxyCODONE (Oxy IR/ROXICODONE) immediate release tablet 5 mg  5 mg Oral Q4H PRN Oralia Manis, MD   5 mg at 09/23/18 1109  . pantoprazole (PROTONIX) EC tablet 40 mg  40 mg Oral Daily Oralia Manis, MD   40 mg at 09/23/18 0851  . piperacillin-tazobactam (ZOSYN) IVPB 3.375 g  3.375 g Intravenous Willow Ora, MD 12.5 mL/hr at 09/23/18 1418    . protein supplement (PREMIER PROTEIN) liquid - approved for s/p bariatric surgery  11 oz Oral BID BM Shaune Pollack, MD   11 oz at 09/23/18 0914  . tiotropium (SPIRIVA) inhalation capsule ([facility] use ONLY) 18 mcg  18 mcg Inhalation Daily Oralia Manis, MD   18 mcg at 09/23/18 0854  . vitamin C (ASCORBIC ACID) tablet 500 mg  500 mg Oral BID Shaune Pollack, MD   500 mg at 09/23/18 9355   Past Medical History:  Diagnosis Date  . Anxiety   . Colon polyps   . COPD (chronic obstructive pulmonary disease) (HCC)   . Diabetes mellitus without complication (HCC)   . DVT (deep venous thrombosis) (HCC)   . DVT (deep venous thrombosis) (HCC)   . History of colonic polyps   . Hyperlipidemia   . Peripheral artery disease (HCC)   . Pulmonary nodules/lesions, multiple 08/2012   NEGATIVE PET SCAN  . Retroperitoneal abscess (HCC)    history of in 04/2013  . Tobacco abuse    Past Surgical History:  Procedure Laterality Date  . AMPUTATION Left 07/24/2018   Procedure: AMPUTATION BELOW KNEE;   Surgeon: Annice Needy, MD;  Location: [facility] ORS;  Service: Vascular;  Laterality: Left;  . APPLICATION OF WOUND VAC Right 07/11/2018   Procedure: APPLICATION OF WOUND VAC;  Surgeon: Gwyneth Revels, DPM;  Location: [facility] ORS;  Service: Podiatry;  Laterality: Right;  . APPLICATION OF WOUND VAC Left 09/07/2018   Procedure: APPLICATION OF WOUND VAC;  Surgeon: Louisa Second, MD;  Location: [facility] ORS;  Service: Vascular;  Laterality: Left;  . APPLICATION OF WOUND VAC Left 09/10/2018   Procedure: APPLICATION OF WOUND VAC [facility] CHANGE );  Surgeon: Festus Barren  S, MD;  Location: [facility] ORS;  Service: Vascular;  Laterality: Left;  . CHOLECYSTECTOMY    . COLONOSCOPY  05/2007  . ENDARTERECTOMY FEMORAL Bilateral 07/17/2018   Procedure: ENDARTERECTOMY FEMORAL - BILATERAL SFA STENT PLACEMENT - LEFT;  Surgeon: Annice Needy, MD;  Location: [facility] ORS;  Service: Vascular;  Laterality: Bilateral;  . FEMORAL ARTERY EXPLORATION  09/07/2018   Procedure: FEMORAL ARTERY EXPLORATION AND REVISION SAPHENOUS VEIN HARVEST FROM RIGHT LEG;  Surgeon: Louisa Second, MD;  Location: [facility] ORS;  Service: Vascular;;  . IRRIGATION AND DEBRIDEMENT FOOT Bilateral 05/25/2018   Procedure: IRRIGATION AND DEBRIDEMENT FOOT application wound vac left  foot;  Surgeon: Recardo Evangelist, DPM;  Location: [facility] ORS;  Service: Podiatry;  Laterality: Bilateral;  . IRRIGATION AND DEBRIDEMENT FOOT Right 07/11/2018   Procedure: FOOT DEBRIDEMENT;  Surgeon: Gwyneth Revels, DPM;  Location: [facility] ORS;  Service: Podiatry;  Laterality: Right;  . LOWER EXTREMITY ANGIOGRAPHY Left 05/26/2018   Procedure: Lower Extremity Angiography with possible intervention;  Surgeon: Annice Needy, MD;  Location: [facility] INVASIVE CV LAB;  Service: Cardiovascular;  Laterality: Left;  . LOWER EXTREMITY ANGIOGRAPHY Right 05/27/2018   Procedure: Lower Extremity Angiography;  Surgeon: Renford Dills, MD;  Location: [facility] INVASIVE CV LAB;  Service: Cardiovascular;  Laterality: Right;  . LOWER  EXTREMITY ANGIOGRAPHY Right 07/15/2018   Procedure: Lower Extremity Angiography;  Surgeon: Renford Dills, MD;  Location: [facility] INVASIVE CV LAB;  Service: Cardiovascular;  Laterality: Right;  . WOUND EXPLORATION Left 09/07/2018   Procedure: WOUND EXPLORATION;  Surgeon: Louisa Second, MD;  Location: [facility] ORS;  Service: Vascular;  Laterality: Left;   Social History Social History   Tobacco Use  . Smoking status: Current Every Day Smoker    Packs/day: 1.50    Years: 47.00    Pack years: 70.50    Types: Cigarettes  . Smokeless tobacco: Never Used  . Tobacco comment: Smoking History 2.5-started at age 62  Substance Use Topics  . Alcohol use: No    Alcohol/week: 0.0 standard drinks  . Drug use: No   Family History Family History  Problem Relation Age of Onset  . Brain cancer Mother   Unable to obtain family history due to patients mental status  Allergies  Allergen Reactions  . Eggs Or Egg-Derived Products Diarrhea, Itching and Nausea And Vomiting   REVIEW OF SYSTEMS (Negative unless checked)  Unable to perform due to patients mental status  Constitutional: [] Weight loss  [] Fever  [] Chills Cardiac: [] Chest pain   [] Chest pressure   [] Palpitations   [] Shortness of breath when laying flat   [] Shortness of breath at rest   [] Shortness of breath with exertion. Vascular:  [] Pain in legs with walking   [] Pain in legs at rest   [] Pain in legs when laying flat   [] Claudication   [] Pain in feet when walking  [] Pain in feet at rest  [] Pain in feet when laying flat   [] History of DVT   [] Phlebitis   [] Swelling in legs   [] Varicose veins   [] Non-healing ulcers Pulmonary:   [] Uses home oxygen   [] Productive cough   [] Hemoptysis   [] Wheeze  [] COPD   [] Asthma Neurologic:  [] Dizziness  [] Blackouts   [] Seizures   [] History of stroke   [] History of TIA  [] Aphasia   [] Temporary blindness   [] Dysphagia   [] Weakness or numbness in arms   [] Weakness or numbness in legs Musculoskeletal:  [] Arthritis    [] Joint swelling   [] Joint pain   [] Low back pain  Hematologic:  Easy bruising  Easy bleeding   Hypercoagulable state   Anemic  Hepatitis Gastrointestinal:  Blood in stool   Vomiting blood  Gastroesophageal reflux/heartburn   Difficulty swallowing. Genitourinary:  Chronic kidney disease   Difficult urination  Frequent urination  Burning with urination   Blood in urine Skin:  Rashes   Ulcers   Wounds Psychological:  History of anxiety    History of major depression.  Physical Examination  Vitals:   09/22/18 0628 09/22/18 1600 09/22/18 1957 09/23/18 0612  BP: 99/60 (!) 103/53 (!) 104/49 (!) 90/59  Pulse: (!) 110 91 95 87  Resp: 18  (!) 24 20  Temp: 98.4 F (36.9 C) 98.5 F (36.9 C) 98.4 F (36.9 C) 98.6 F (37 C)  TempSrc: Oral Axillary Oral Oral  SpO2: 96% 98% 97% 98%  Weight:      Height:       Body mass index is 20.24 kg/m. Gen:  Very lethargic, Does not answer questions appropriately, responds to pain Head: [place]/AT, No temporalis wasting. Prominent temp pulse not noted. Ear/Nose/Throat: Hearing grossly intact, nares w/o erythema or drainage, oropharynx w/o Erythema/Exudate Eyes: Sclera non-icteric, conjunctiva clear Neck: Trachea midline.  No JVD.  Pulmonary:  Good air movement, respirations not labored, equal bilaterally.  Cardiac: RRR, normal S1, S2. Vascular:  Vessel Right Left  Radial Palpable Palpable  Ulnar Palpable Palpable  Brachial Palpable Palpable  Carotid Palpable, without bruit Palpable, without bruit  Aorta Not palpable N/A  Femoral Palpable Palpable  Popliteal Palpable Palpable  PT Non-Palpable BKA  DP Non- palpable BKA   Right Lower Extremity: Thigh soft, Calf soft. Extremity moderately edematous. Extremity warm distally. Hard to palpate pedal pulses. Right foot wound is stable - dry without drainage.  Left Femoral Endarterectomy Wound: Currently with wet to dry dressing in place. Flap in place no exposed vessels.  Fibrinous exudate noted. Minimal granulation tissue. No foul order. Clear drainage. Surrounding skin is healthy. No cellulitis.  Left BKA Stump: Warm, incision is intact with minimal clear drainage. Unable to express any purulent material. Surrounding skin is healthy. No cellulitis noted.   Gastrointestinal: soft, non-tender/non-distended. No guarding/reflex.  Musculoskeletal: M/S 5/5 throughout.   Neurologic: Sensation grossly intact in extremities.  Symmetrical.   Psychiatric: Lethargic Dermatologic: See above Lymph : No Cervical, Axillary, or Inguinal lymphadenopathy.  CBC Lab Results  Component Value Date   WBC 8.1 09/22/2018   HGB 7.9 (L) 09/22/2018   HCT 24.7 (L) 09/22/2018   MCV 94.6 09/22/2018   PLT 149 (L) 09/22/2018   BMET    Component Value Date/Time   NA 134 (L) 09/22/2018 0417   NA 142 10/14/2014 0644   K 4.1 09/22/2018 0417   K 3.6 10/14/2014 0644   CL 102 09/22/2018 0417   CL 109 10/14/2014 0644   CO2 24 09/22/2018 0417   CO2 29 10/14/2014 0644   GLUCOSE 239 (H) 09/22/2018 0417   GLUCOSE 142 (H) 10/14/2014 0644   BUN 8 09/22/2018 0417   BUN 9 10/14/2014 0644   CREATININE 0.50 09/22/2018 0417   CREATININE 0.53 10/14/2014 0644   CALCIUM 7.0 (L) 09/22/2018 0417   CALCIUM 7.7 (L) 10/14/2014 0644   GFRNONAA >60 09/22/2018 0417   GFRNONAA >60 10/14/2014 0644   GFRAA >60 09/22/2018 0417   GFRAA >60 10/14/2014 0644   Estimated Creatinine Clearance: 54.3 mL/min (by C-G formula based on SCr of 0.5 mg/dL).  COAG Lab Results  Component Value Date   INR 1.2 09/10/2018  INR 1.1 09/06/2018   INR 1.22 07/24/2018   Radiology Ct Pelvis W Contrast  Result Date: 09/21/2018 CLINICAL DATA:  Worsening posterior pelvic wound. EXAM: CT PELVIS WITH CONTRAST TECHNIQUE: Multidetector CT imaging of the pelvis was performed using the standard protocol following the bolus administration of intravenous contrast. CONTRAST:  80mL OMNIPAQUE IOHEXOL 300 MG/ML  SOLN COMPARISON:   MRI 09/07/2018.  CT scan 09/05/2018. FINDINGS: Urinary Tract: Foley catheter noted in the bladder lumen. Urine and gas are seen in the bladder lumen. Circumferential bladder wall thickening evident. Bowel: No small bowel or colonic dilatation within the visualized abdomen. Vascular/Lymphatic: Patient is status post aortic endograft placement. No pelvic sidewall lymphadenopathy. Reproductive: The uterus is unremarkable. There is no adnexal mass. Other:  Moderate volume intraperitoneal free fluid. Musculoskeletal: Diffuse body wall edema. A complex collection of fluid and gas is identified in the soft tissues of the posterior pelvis. This tracks from the subcutaneous fat superficial to the lower sacrum/coccyx to the left along the gluteus musculature although a lateral to the level of the greater trochanter of the proximal femur. This lesion shows irregular rim enhancement with internal enhancing septations. Imaging features are compatible with an abscess with involvement of the gluteus musculature and subcutaneous fat. Although difficult to measure due to the shape, this abscess is roughly 16 x 4 x 9 cm. Open wound noted in the left groin with a collection of rim enhancing gas and fluid measuring 4.5 x 2.6 x 2.2 cm. This rim enhancing fluid collection tracks deep and inferiorly along the left SFA stent. IMPRESSION: 1. Large complex rim enhancing collection of fluid and gas is identified superficial to and involving the left gluteus muscles. This abscess tracks from the midline lower sacrum/coccygeal region laterally to the level of the greater trochanter of the proximal left femur. Imaging features compatible with abscess measuring approximately 16 x 4 x 9 cm. 2. A second rim enhancing collection of fluid and gas is identified in the left groin region, deep to the open wound. This collection measures 5 x 3 x 2 cm and tracks deep to a position immediately adjacent to the left femoral artery stent. 3. Diffuse body wall  edema. 4. Moderate volume intraperitoneal free fluid. 5. Foley catheter tip is in the bladder lumen with circumferential bladder wall thickening. Residual urine noted within the bladder lumen and correlation for Foley malfunction suggested. Electronically Signed   By: Kennith Center M.D.   On: 09/21/2018 18:28   Ct Pelvis W Contrast  Result Date: 09/05/2018 CLINICAL DATA:  Here with ex-boyfriend. Pt has had vascular surgery with balloons to legs. Wound opened. Recent amputation. Pt is diabetic. He has taken her back to vascular center 2 days ago for wound that he is concerned about. Was placed on a second round of antibiotics. Groin wound is swollen per family. Draining yellow fluid per family. No fever. Per family, infection under the skin of the buttocks. Severe pain in this region. EXAM: CT PELVIS WITH CONTRAST TECHNIQUE: Multidetector CT imaging of the pelvis was performed using the standard protocol following the bolus administration of intravenous contrast. CONTRAST:  75mL OMNIPAQUE IOHEXOL 300 MG/ML  SOLN COMPARISON:  06/07/2018 CT abdomen and pelvis FINDINGS: Urinary Tract: Distended urinary bladder. LOWER portions of the kidneys are unremarkable. Bowel: Moderate stool burden. No bowel dilatation or bowel wall thickening. Vascular/Lymphatic: Aorto bi-iliac stent grafts. Bilateral femoral artery stents. There is dense atherosclerosis of the native vessels. Along the LEFT proximal femoral artery there is an irregular heterogeneous  gas and fluid collection with rim enhancement which measures 1.7 x 7.8 x 2.5 centimeters. Small amount of air is identified within the wound at the skin, consistent with the history of draining wound. There is no evidence for pseudoaneurysm or arteriovenous fistula. Reproductive:  The uterus is present.  No adnexal mass. Other:  No ascites.  Anterior abdominal wall is unremarkable. The LEFT gluteus maximus is enlarged and heterogeneous, measuring 5.4 centimeters, compared to 1.8  centimeters on the RIGHT. Vessels traverse the muscle and considerations include inflammation/infection, or resolving hematoma. Mass is significantly less likely given the normal appearance on the December scan. Given the heterogeneity, is difficult to determine if there is a drainable fluid collection in this region. Significant diffuse body wall edema. Musculoskeletal: No acute osseous abnormality. IMPRESSION: 1. Heterogeneous gas and fluid collection adjacent to the LEFT femoral artery, suspicious for abscess. 2. Heterogeneous enlargement of the LEFT gluteus maximus, consistent with abscess, inflammation, or resolving hematoma. Given the heterogeneity, infection/inflammation is favored. Consider ultrasound to evaluate for drainable fluid collections. 3. Distended urinary bladder. 4. Vascular stents. Electronically Signed   By: Norva Pavlov M.D.   On: 09/05/2018 15:25   Mr Pelvis Wo Contrast  Result Date: 09/07/2018 CLINICAL DATA:  Evaluate gluteal fluid collection. Recent bilateral femoral endarterectomies with left groin infection. EXAM: MRI PELVIS WITHOUT CONTRAST TECHNIQUE: Multiplanar multisequence MR imaging of the pelvis was performed. No intravenous contrast was administered. COMPARISON:  CT pelvis dated September 05, 2018. FINDINGS: Urinary Tract: No abnormality visualized. Foley catheter within the decompressed bladder. Bowel:  Unremarkable visualized pelvic bowel loops. Vascular/Lymphatic: 2.6 x 3.2 x 5.0 cm heterogeneous fluid collection adjacent to the distal left common femoral artery, draining to the skin. No enlarged pelvic lymph nodes. Reproductive:  No mass or other significant abnormality. Other:  Small amount of presacral and pelvic fluid. Musculoskeletal: No suspicious marrow signal abnormality. No hip joint effusion. No acute fracture or dislocation. Prominent asymmetric enlargement of the left gluteus maximus muscle which is edematous and demonstrates patchy areas of T1 hyperintensity.  There is some ill-defined fluid within the muscle, but no discrete drainable fluid collection. There is also edema within the proximal left gluteus minimus and medius muscles, and the left adductor musculature. Diffuse anasarca with slightly asymmetric soft tissue swelling of the left proximal thigh. Small midline sacral decubitus ulcer. IMPRESSION: 1. Enlarged, edematous left gluteus maximus muscle with areas of patchy T1 hyperintensity, likely reflecting a combination of intramuscular hemorrhage and myositis. No discrete abscess or hematoma. 2. 5.0 cm heterogeneous fluid collection adjacent to the distal left femoral artery, concerning for abscess. 3. Small midline sacral decubitus ulcer without evidence of underlying osteomyelitis. 4. Diffuse anasarca with asymmetric soft tissue swelling of the left proximal thigh, which could reflect superimposed cellulitis. Correlate with physical exam. Electronically Signed   By: Obie Dredge M.D.   On: 09/07/2018 10:53   Assessment/Plan The patient is a 63 year old female well known to our service with a past medical history of tobacco abuse, hyperlipidemia, diabetes, COPD, and peripheral artery disease s/p multiple endovascular interventions to the lower extremities, left femoral endarterectomy, and left femoral endarterectomy wound debridement with VAC therapy placement. 1. PAD: Reviewed CT of the pelvis with Dr. Wyn Quaker. Femoral artery abscess is communicating with open groin wound. Unfortunately, this is not a drainable area percutaneously. Can intervene open however at this time, it seems as the patient is not consenting to surgery. She is consenting for drain placement. The patient is occluded from the left external iliac  distally, the profunda reconstitutes through collaterals. This is not amenable to endovascular intervention. Open surgical intervention would be considered high risk in this critically ill patient and unlikely to see durable arterial patency in an  already amputated limb. Currently, the patients stump is intact however is at high risk for stump breakdown / failure. Recommend continued VAC therapy to the left femoral wound with optimization for wound healing.  2. Gluteal Abscess: As per management by general surgery 3. Diabetes: On appropriate medications. Encouraged good control as its slows the progression of atherosclerotic disease 4. Hyperlipidemia: On statin. Encouraged good control as its slows the progression of atherosclerotic disease.  Discussed with Dr. Weldon Inchesew  Ree Alcalde A Kayle Passarelli, PA-C  09/23/2018 3:01 PM  This note was created with Dragon medical transcription system.  Any error is purely unintentional.

## 2018-09-23 NOTE — Progress Notes (Signed)
MD notified: Do you want to order IV fluids for the patient she is not really drinking much fluids. Urine output has only been  since 7AM and it seems like she has been having low urine output since last night.

## 2018-09-23 NOTE — Progress Notes (Addendum)
[facility] SURGICAL PROGRESS NOTE (cpt [phone])  Hospital Day(s): 2.   Post op day(s):  Marland Kitchen   Interval History: Patient seen and examined, no acute events or new complaints overnight. Patient continues to report discomfort in her left buttock. No reports of fever.   Again, the patient is a poor historian and her participation in the interview is limited. Her family has been difficult to contact regarding goals of care and possible consent. RN was able to identify DPOA and contact him; Dr. Lady Gary was also able to speak with him. Patient states she would be agreeable to drain placement rather than surgery. She states "I am sick of having surgery."  Review of Systems:  Constitutional: denies fever Integumentary: + Left buttock fluid collection, sacral pressure wound  ROS limited due to patient being poor historian and lack of family at bedside.   Vital signs in last 24 hours: [min-max] current  Temp:  [98.4 F (36.9 C)-98.6 F (37 C)] 98.6 F (37 C) (03/24 0612) Pulse Rate:  [87-95] 87 (03/24 0612) Resp:  [20-24] 20 (03/24 0612) BP: (90-104)/(49-59) 90/59 (03/24 0612) SpO2:  [97 %-98 %] 98 % (03/24 0612)     Height: 5\' 1"  (154.9 cm) Weight: 48.6 kg BMI (Calculated): 20.26   Intake/Output this shift:  No intake/output data recorded.     Physical Exam:  Constitutional: alert, cooperative and no distress  HENT: normocephalic without obvious abnormality  Eyes: PERRL, EOM's grossly intact and symmetric  Respiratory: breathing non-labored at rest  Integumentary: Multiple santyl dressings in place, no overlaying erythema to the skin, no purulence or fluctuance, mild tenderness to palpation diffusely.     Labs:  CBC Latest Ref Rng & Units 09/22/2018 09/21/2018 09/13/2018  WBC 4.0 - 10.5 K/uL 8.1 10.8(H) 11.1(H)  Hemoglobin 12.0 - 15.0 g/dL 7.9(L) 8.0(L) 8.7(L)  Hematocrit 36.0 - 46.0 % 24.7(L) 25.5(L) 28.7(L)  Platelets 150 - 400 K/uL 149(L) 182 245   CMP Latest Ref  Rng & Units 09/22/2018 09/21/2018 09/10/2018  Glucose 70 - 99 mg/dL 921(J) 94(R) 99  BUN 8 - 23 mg/dL 8 10 14   Creatinine 0.44 - 1.00 mg/dL 7.40 8.14 4.81  Sodium 135 - 145 mmol/L 134(L) 134(L) 135  Potassium 3.5 - 5.1 mmol/L 4.1 4.3 4.3  Chloride 98 - 111 mmol/L 102 100 107  CO2 22 - 32 mmol/L 24 28 23   Calcium 8.9 - 10.3 mg/dL 7.0(L) 7.7(L) 7.5(L)  Total Protein 6.5 - 8.1 g/dL - - -  Total Bilirubin 0.3 - 1.2 mg/dL - - -  Alkaline Phos 38 - 126 U/L - - -  AST 15 - 41 U/L - - -  ALT 0 - 44 U/L - - -    Imaging studies: No new pertinent imaging studies   Assessment/Plan: (ICD-10's: L02.31) 62 y.o.debilitated and chronically illfemalewith a large complex gluteal fluid collecion, extending from her left greater trochanter, incompletely draining, as well as an infected open left groin wound including exposed femoral artery s/plLeft femoral arterial stent and endarterectomy as well ascomorbidities including severe malnutrition, PAD s/p Left BKA, DM, HLD, COPD,pulmonary nodules/lesions,GERD,history of retroperitoneal abscess (2014), generalized anxiety disorder, and chronic ongoing tobacco abuse (smoking).   - Although drain placement is not the typical first line management for this complex fluid collection (abscess vs infected hematoma), the patient would likely not heal well from a wide debridement and does not seem agreeable with surgery.   - Dr Lady Gary discussed the above with the patient's DPOA Vanetta Mulders) who would  be agreeable to consenting to drain placement   - Will consult with IR for percutaneous drainage of this complex fluid collection (abscess vs infected or liquefying hematoma).              - Continue with vascular surgery + palliative care consultation             - Continue IV ABx (Zosyn, Vancomycin), IVF, okay for diet but would need to be NPO for any intervention             - Trend WBC; currently normalized             - Pain control PRN               -  medical management of comorbidities as per medical team              - DVT prophylaxis   All of the above findings and recommendations were discussed with the patient and the medical team.  -- Lynden Oxford, PA-C [facility] 09/23/2018, 8:12 AM [phone] M-F: 7am - 4pm  I saw and evaluated the patient.  I agree with the above documentation, exam, and plan, which I have edited where appropriate. Duanne Guess  2:25 PM

## 2018-09-23 NOTE — Progress Notes (Signed)
[facility] [facility]   PATIENT NAME: April [name]    MR#:  [id number]  DATE OF BIRTH:  [birth year]  SUBJECTIVE:  CHIEF COMPLAINT:   Chief Complaint  Patient presents with  . Wound Infection   Left hip pain. REVIEW OF SYSTEMS:  Review of Systems  Constitutional: Negative for chills, fever and malaise/fatigue.  HENT: Negative for sore throat.   Eyes: Negative for blurred vision and double vision.  Respiratory: Negative for cough, hemoptysis, shortness of breath, wheezing and stridor.   Cardiovascular: Negative for chest pain, palpitations, orthopnea and leg swelling.  Gastrointestinal: Negative for abdominal pain, blood in stool, diarrhea, melena, nausea and vomiting.  Genitourinary: Negative for dysuria, flank pain and hematuria.  Musculoskeletal: Positive for joint pain. Negative for back pain.       Left BKA stump site drainage, put on dressing by RN.  Skin: Negative for rash.  Neurological: Negative for dizziness, sensory change, focal weakness, seizures, loss of consciousness, weakness and headaches.  Endo/Heme/Allergies: Negative for polydipsia.  Psychiatric/Behavioral: Negative for depression. The patient is not nervous/anxious.     DRUG ALLERGIES:   Allergies  Allergen Reactions  . Eggs Or Egg-Derived Products Diarrhea, Itching and Nausea And Vomiting   VITALS:  Blood pressure (!) 90/59, pulse 87, temperature 98.6 F (37 C), temperature source Oral, resp. rate 20, height 5\' 1"  (1.549 m), weight 48.6 kg, SpO2 98 %. PHYSICAL EXAMINATION:  Physical Exam Constitutional:      General: She is not in acute distress.    Appearance: Normal appearance.  HENT:     Head: Normocephalic.     Mouth/Throat:     Mouth: Mucous membranes are moist.  Eyes:     General: No scleral icterus.    Conjunctiva/sclera: Conjunctivae normal.     Pupils: Pupils are equal, round, and reactive to light.  Neck:     Musculoskeletal: Normal range of motion  and neck supple.     Vascular: No JVD.     Trachea: No tracheal deviation.  Cardiovascular:     Rate and Rhythm: Normal rate and regular rhythm.     Heart sounds: Normal heart sounds. No murmur. No gallop.   Pulmonary:     Effort: Pulmonary effort is normal. No respiratory distress.     Breath sounds: Normal breath sounds. No wheezing or rales.  Abdominal:     General: Bowel sounds are normal. There is no distension.     Palpations: Abdomen is soft.     Tenderness: There is no abdominal tenderness. There is no rebound.  Musculoskeletal:        General: No tenderness.     Comments: Left thigh tenderness and swelling, in dressing.  Skin:    Findings: No erythema or rash.  Neurological:     Mental Status: She is alert and oriented to person, place, and time.     Cranial Nerves: No cranial nerve deficit.    LABORATORY PANEL:  Female CBC Recent Labs  Lab 09/22/18 0417  WBC 8.1  HGB 7.9*  HCT 24.7*  PLT 149*   ------------------------------------------------------------------------------------------------------------------ Chemistries  Recent Labs  Lab 09/22/18 0417  NA 134*  K 4.1  CL 102  CO2 24  GLUCOSE 239*  BUN 8  CREATININE 0.50  CALCIUM 7.0*   RADIOLOGY:  No results found. ASSESSMENT AND PLAN:   Surgical site infection of left thigh. Continue IV zyvox and Zosyn, follow-up surgeon for possible surgery.    Diabetes (HCC) -sliding scale  insulin coverage, continue home Lantus 8 units at bedtime.   COPD (chronic obstructive pulmonary disease) (HCC) -home dose inhalers, stable.   PAD (peripheral artery disease) (HCC) -Hold Xarelto and aspirin for possible surgery. Left BKA stump draining.  Vascular surgery consult.  Continue antibiotics.    HLD (hyperlipidemia) -home dose antilipid Tobacco abuse.  Smoking cessation was counseled for 3 to 4 minutes, nicotine patch.  I called the patient's friend Mr. Mayford Knife, but nobody answered the phone. All the records are  reviewed and case discussed with Care Management/Social Worker. Management plans discussed with the patient, family and they are in agreement.  CODE STATUS: Full Code  TOTAL TIME TAKING CARE OF THIS PATIENT: 35 minutes.   More than 50% of the time was spent in counseling/coordination of care: YES  POSSIBLE D/C IN >3 DAYS, DEPENDING ON CLINICAL CONDITION.   Shaune Pollack M.D on 09/23/2018 at 11:03 AM  Between 7am to 6pm - Pager - [phone]  After 6pm go to [email or web address] - Therapist, nutritional Hospitalists

## 2018-09-23 NOTE — Progress Notes (Addendum)
MD and surgeon notified: Dr. Imogene Burn I spoke with surgery survices and they indicated that the patient would need a vascular consult since they were the one that did a bebridement on the L groin site so they would be the team in changed of managing the site if a wound VAC is needed. Also since they did the left BKA that is draining purulent drainaged. Would you mind placing and order please.

## 2018-09-24 ENCOUNTER — Inpatient Hospital Stay: Payer: Medicare Other

## 2018-09-24 LAB — CBC
HCT: 26.5 % — ABNORMAL LOW (ref 36.0–46.0)
HEMOGLOBIN: 8.2 g/dL — AB (ref 12.0–15.0)
MCH: 30.4 pg (ref 26.0–34.0)
MCHC: 30.9 g/dL (ref 30.0–36.0)
MCV: 98.1 fL (ref 80.0–100.0)
Platelets: 124 10*3/uL — ABNORMAL LOW (ref 150–400)
RBC: 2.7 MIL/uL — AB (ref 3.87–5.11)
RDW: 15.7 % — ABNORMAL HIGH (ref 11.5–15.5)
WBC: 8.1 10*3/uL (ref 4.0–10.5)
nRBC: 0 % (ref 0.0–0.2)

## 2018-09-24 LAB — GLUCOSE, CAPILLARY
GLUCOSE-CAPILLARY: 151 mg/dL — AB (ref 70–99)
Glucose-Capillary: 192 mg/dL — ABNORMAL HIGH (ref 70–99)
Glucose-Capillary: 80 mg/dL (ref 70–99)
Glucose-Capillary: 53 mg/dL — ABNORMAL LOW (ref 70–99)
Glucose-Capillary: 54 mg/dL — ABNORMAL LOW (ref 70–99)
Glucose-Capillary: 146 mg/dL — ABNORMAL HIGH (ref 70–99)

## 2018-09-24 LAB — PROTIME-INR
INR: 1.5 — ABNORMAL HIGH (ref 0.8–1.2)
Prothrombin Time: 17.7 seconds — ABNORMAL HIGH (ref 11.4–15.2)

## 2018-09-24 LAB — APTT: aPTT: 41 seconds — ABNORMAL HIGH (ref 24–36)

## 2018-09-24 MED ORDER — DEXTROSE 50 % IV SOLN
INTRAVENOUS | Status: AC
  Administered 2018-09-24: 50 mL via INTRAVENOUS
  Filled 2018-09-24: qty 50

## 2018-09-24 MED ORDER — MIDAZOLAM HCL 5 MG/5ML IJ SOLN
INTRAMUSCULAR | Status: AC
  Filled 2018-09-24: qty 5

## 2018-09-24 MED ORDER — LINEZOLID 600 MG PO TABS
600.0000 mg | ORAL_TABLET | Freq: Two times a day (BID) | ORAL | Status: DC
  Administered 2018-09-24 – 2018-09-28 (×7): 600 mg via ORAL
  Filled 2018-09-24 (×9): qty 1

## 2018-09-24 MED ORDER — FENTANYL CITRATE (PF) 100 MCG/2ML IJ SOLN
INTRAMUSCULAR | Status: AC
  Filled 2018-09-24: qty 4

## 2018-09-24 MED ORDER — SODIUM CHLORIDE 0.9% FLUSH
5.0000 mL | Freq: Three times a day (TID) | INTRAVENOUS | Status: DC
  Administered 2018-09-24 – 2018-09-28 (×10): 5 mL

## 2018-09-24 MED ORDER — MIDAZOLAM HCL 5 MG/5ML IJ SOLN
INTRAMUSCULAR | Status: AC | PRN
  Administered 2018-09-24 (×2): 0.5 mg via INTRAVENOUS

## 2018-09-24 MED ORDER — GLUCOSE 40 % PO GEL
1.0000 | Freq: Once | ORAL | Status: AC
  Administered 2018-09-24: 37.5 g via ORAL
  Filled 2018-09-24 (×2): qty 1

## 2018-09-24 MED ORDER — DEXTROSE 50 % IV SOLN
1.0000 | INTRAVENOUS | Status: DC | PRN
  Administered 2018-09-24: 50 mL via INTRAVENOUS

## 2018-09-24 MED ORDER — FENTANYL CITRATE (PF) 100 MCG/2ML IJ SOLN
INTRAMUSCULAR | Status: AC | PRN
  Administered 2018-09-24: 12.5 ug via INTRAVENOUS
  Administered 2018-09-24: 50 ug via INTRAVENOUS

## 2018-09-24 MED ORDER — GLUCOSE 40 % PO GEL
1.0000 | Freq: Once | ORAL | Status: AC
  Administered 2018-09-24: 37.5 g via ORAL
  Filled 2018-09-24: qty 1

## 2018-09-24 NOTE — Progress Notes (Signed)
Updated friend Vanetta Mulders regarding patient's status. Mr. Mayford Knife did not have the password. Was given permission by patient to speak to Mr. Mayford Knife over the phone. Explained to Mr. Mayford Knife due to the visitor restrictions we are requiring each patient to have a password. Asked Mr. Mayford Knife to contact family to inquire about the password. Mr. Mayford Knife asked if I would call him from patient's room so he could speak with patient.   Madie Reno, RN

## 2018-09-24 NOTE — Progress Notes (Signed)
Date of Admission:  09/21/2018      ID: April [name] is a 63 y.o. female  Principal Problem:   Surgical site infection Active Problems:   Diabetes (HCC)   COPD (chronic obstructive pulmonary disease) (HCC)   HLD (hyperlipidemia)   PAD (peripheral artery disease) (HCC)   Surgical wound infection    Subjective: Sleeping IR placed a drain in the left gluteal abscess today  Medications:  . atorvastatin  40 mg Oral Daily  . Chlorhexidine Gluconate Cloth  6 each Topical Q0600  . collagenase   Topical Daily  . fentaNYL      . ferrous sulfate  325 mg Oral BID WC  . gabapentin  100 mg Oral QHS  . insulin aspart  0-5 Units Subcutaneous QHS  . insulin aspart  0-9 Units Subcutaneous TID WC  . insulin glargine  8 Units Subcutaneous QHS  . linezolid  600 mg Oral Q12H  . midazolam      . midodrine  10 mg Oral TID WC  . mometasone-formoterol  2 puff Inhalation BID  . mupirocin ointment  1 application Nasal BID  . pantoprazole  40 mg Oral Daily  . protein supplement shake  11 oz Oral BID BM  . sodium chloride flush  5 mL Intracatheter Q8H  . tiotropium  18 mcg Inhalation Daily  . ascorbic acid  500 mg Oral BID    Objective: Vital signs in last 24 hours: Temp:  [97.8 F (36.6 C)-98.6 F (37 C)] 98.5 F (36.9 C) (03/25 1220) Pulse Rate:  [77-104] 102 (03/25 1508) Resp:  [15-25] 15 (03/25 1508) BP: (101-112)/(53-67) 105/64 (03/25 1508) SpO2:  [91 %-100 %] 100 % (03/25 1508)  PHYSICAL EXAM:  General: sleeping Left groin wound vac Left gluteal drain  Lab Results Recent Labs    09/22/18 0417 09/24/18 0347  WBC 8.1 8.1  HGB 7.9* 8.2*  HCT 24.7* 26.5*  NA 134*  --   K 4.1  --   CL 102  --   CO2 24  --   BUN 8  --   CREATININE 0.50  --    Liver Panel No results for input(s): PROT, ALBUMIN, AST, ALT, ALKPHOS, BILITOT, BILIDIR, IBILI in the last 72 hours. Sedimentation Rate No results for input(s): ESRSEDRATE in the last 72 hours. C-Reactive Protein No results  for input(s): CRP in the last 72 hours.  Microbiology:  Studies/Results: Ct Image Guided Drainage By Percutaneous Catheter  Result Date: 09/24/2018 INDICATION: Left gluteal abscess EXAM: CT GUIDED DRAINAGE OF LEFT GLUTEAL ABSCESS ABSCESS MEDICATIONS: The patient is currently admitted to the hospital and receiving intravenous antibiotics. The antibiotics were administered within an appropriate time frame prior to the initiation of the procedure. ANESTHESIA/SEDATION: One mg IV Versed 62.5 mcg IV Fentanyl Moderate Sedation Time:  17 minutes The patient was continuously monitored during the procedure by the interventional radiology nurse under my direct supervision. COMPLICATIONS: None immediate. TECHNIQUE: Informed written consent was obtained from the patient after a thorough discussion of the procedural risks, benefits and alternatives. All questions were addressed. Maximal Sterile Barrier Technique was utilized including caps, mask, sterile gowns, sterile gloves, sterile drape, hand hygiene and skin antiseptic. A timeout was performed prior to the initiation of the procedure. PROCEDURE: The left gluteal region was prepped with ChloraPrep in a sterile fashion, and a sterile drape was applied covering the operative field. A sterile gown and sterile gloves were used for the procedure. Local anesthesia was provided with 1% Lidocaine. Under  CT guidance, an 18 gauge needle was advanced into the left gluteal abscess and removed over an Amplatz wire. Twelve [place] dilator followed by a 12 [place] drain were inserted. It was looped and string fixed then sewn to the skin. Frank pus was aspirated. FINDINGS: Imaging documents placement of a 41 French left gluteal abscess drain. IMPRESSION: Successful left 12 French gluteal abscess drainage. Electronically Signed   By: Jolaine Click M.D.   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 Wound assessed by vascular , back on wound vac  On linezolid/zosyn-   Watch closely platelet/Hb/ on linezolid   Left gluteal abscess- IR placed a drain- culture sent  ?PAD - smoker- has had b/l femoral endarterectomy/stents  DM  Left BKA in Jan 2020- healed well  Rt foot surgical site healed  Discussed with pharmacist

## 2018-09-24 NOTE — Procedures (Signed)
L gluteal 12 Fr abscess drain Frank pus EBL 0 Comp 0

## 2018-09-24 NOTE — Care Management Important Message (Signed)
Important Message  Patient Details  Name: April [name] MRN: [id number] Date of Birth: September 28, 1955   Medicare Important Message Given:  Yes    York Spaniel, LCSW 09/24/2018, 12:21 PM

## 2018-09-24 NOTE — Progress Notes (Addendum)
[facility] SURGICAL PROGRESS NOTE (cpt [phone])  Hospital Day(s): 3.   Post op day(s):  Marland Kitchen   Interval History: Patient seen and examined, no acute events or new complaints overnight. Patient reports continued soreness in her left buttock. No reports of fever or chills. She is resting comfortably and will complain of pain when moved for examination. Seen by vascular surgery and wound vac recommended for left inguinal wound. No additional complaints.   Again, patient seems amenable to attempted drainage of left gluteal complex fluid collection. She continues to state "I am frankly sick of having surgeries." this was discussed with patient's DPOA who would be agreeable to consenting to drain placement.   Review of Systems:  Constitutional: denies fever, chills Integumentary:+ Left buttock fluid collection, sacral pressure wound  ROS limited due to patient being poor historian and lack of family at bedside.   Vital signs in last 24 hours: [min-max] current  Temp:  [97.8 F (36.6 C)-98.6 F (37 C)] 98.6 F (37 C) (03/25 0510) Pulse Rate:  [77-96] 96 (03/25 0510) Resp:  [16] 16 (03/25 0510) BP: (102-103)/(53-54) 102/53 (03/25 0510) SpO2:  [91 %-100 %] 91 % (03/25 0510)     Height: 5\' 1"  (154.9 cm) Weight: 48.6 kg BMI (Calculated): 20.26   Intake/Output this shift:  Total I/O In: 470.7 [I.V.:361; IV Piggyback:109.8] Out: -      Physical Exam:  Constitutional: alert, cooperative and no distress  HENT: normocephalic without obvious abnormality  Eyes: PERRL, EOM's grossly intact and symmetric  Respiratory: breathing non-labored at rest Integumentary: Multiple santyl dressings in place, no overlaying erythema to the skin, no purulence or fluctuance, mild tenderness to palpation diffusely.    Labs:  CBC Latest Ref Rng & Units 09/24/2018 09/22/2018 09/21/2018  WBC 4.0 - 10.5 K/uL 8.1 8.1 10.8(H)  Hemoglobin 12.0 - 15.0 g/dL 8.2(L) 7.9(L) 8.0(L)  Hematocrit 36.0 -  46.0 % 26.5(L) 24.7(L) 25.5(L)  Platelets 150 - 400 K/uL 124(L) 149(L) 182   CMP Latest Ref Rng & Units 09/22/2018 09/21/2018 09/10/2018  Glucose 70 - 99 mg/dL 121(F) 75(O) 99  BUN 8 - 23 mg/dL 8 10 14   Creatinine 0.44 - 1.00 mg/dL 8.32 5.49 8.26  Sodium 135 - 145 mmol/L 134(L) 134(L) 135  Potassium 3.5 - 5.1 mmol/L 4.1 4.3 4.3  Chloride 98 - 111 mmol/L 102 100 107  CO2 22 - 32 mmol/L 24 28 23   Calcium 8.9 - 10.3 mg/dL 7.0(L) 7.7(L) 7.5(L)  Total Protein 6.5 - 8.1 g/dL - - -  Total Bilirubin 0.3 - 1.2 mg/dL - - -  Alkaline Phos 38 - 126 U/L - - -  AST 15 - 41 U/L - - -  ALT 0 - 44 U/L - - -     Imaging studies: No new pertinent imaging studies   Assessment/Plan: (ICD-10's: L02.31) 63 y.o.debilitated and chronically illfemalewith a large complex gluteal fluid collecion, extending from her left greater trochanter, incompletely draining, as well as an infected open left groin wound including exposed femoral artery s/plLeft femoral arterial stent and endarterectomy as well ascomorbidities including severe malnutrition, PAD s/p Left BKA, DM, HLD, COPD,pulmonary nodules/lesions,GERD,history of retroperitoneal abscess (2014), generalized anxiety disorder, and chronic ongoing tobacco abuse (smoking).   - Will make NPO pending evaluation for drain placement; okay for diet following any potential procedure  - Again, although drain placement is not the typical first line management for this complex fluid collection (abscess vs infected hematoma), the patient would likely not heal well from a  wide debridement and does not seem agreeable with surger   - Consult with IR for percutaneous drainage of this complex fluid collection (abscess vs infected or liquefying hematoma) is pending.    - Appreciate vascular surgery consultation; management of left inguinal wound/BKA per them   - Continue IV ABx (Zosyn, Vancomycin), IVF - Pain control PRN  - medical management of  comorbidities as per medical team - DVT prophylaxis  All of the above findings and recommendations were discussed with the patient, and the medical team, and all of patient's questions were answered to herexpressed satisfaction.  -- Lynden Oxford, PA-C [facility] 09/24/2018, 10:00 AM [phone] M-F: 7am - 4pm

## 2018-09-24 NOTE — Consult Note (Signed)
Chief Complaint: Patient was seen in consultation today for  Chief Complaint  Patient presents with   Wound Infection   at the request of * No referring provider recorded for this case *  Referring Physician(s): * No referring provider recorded for this case *  SupervisJolaine Clickcian: Yanique Mulvihill  Patient Status: [facility] - In-pt  History of Present Illness: April [name] is a 63 y.o. female who underwent femoral artery endarterectomy and developed a left operative wound infection. She also developed a large left gluteal abscess. She is referred for drainage.  Past Medical History:  Diagnosis Date   Anxiety    Colon polyps    COPD (chronic obstructive pulmonary disease) (HCC)    Diabetes mellitus without complication (HCC)    DVT (deep venous thrombosis) (HCC)    DVT (deep venous thrombosis) (HCC)    History of colonic polyps    Hyperlipidemia    Peripheral artery disease (HCC)    Pulmonary nodules/lesions, multiple 08/2012   NEGATIVE PET SCAN   Retroperitoneal abscess (HCC)    history of in 04/2013   Tobacco abuse     Past Surgical History:  Procedure Laterality Date   AMPUTATION Left 07/24/2018   Procedure: AMPUTATION BELOW KNEE;  Surgeon: Annice Needy, MD;  Location: [facility];  Service: Vascular;  Laterality: Left;   APPLICATION OF WOUND VAC Right 07/11/2018   Procedure: APPLICATION OF WOUND VAC;  Surgeon: Gwyneth Revels, DPM;  Location: [facility];  Service: Podiatry;  Laterality: Right;   APPLICATION OF WOUND VAC Left 09/07/2018   Procedure: APPLICATION OF WOUND VAC;  Surgeon: Louisa Second, MD;  Location: [facility];  Service: Vascular;  Laterality: Left;   APPLICATION OF WOUND VAC Left 09/10/2018   Procedure: APPLICATION OF WOUND VAC ( VAC CHANGE );  Surgeon: Annice Needy, MD;  Location: [facility];  Service: Vascular;  Laterality: Left;   CHOLECYSTECTOMY     COLONOSCOPY  05/2007   ENDARTERECTOMY FEMORAL Bilateral 07/17/2018   Procedure:  ENDARTERECTOMY FEMORAL - BILATERAL SFA STENT PLACEMENT - LEFT;  Surgeon: Annice Needy, MD;  Location: [facility];  Service: Vascular;  Laterality: Bilateral;   FEMORAL ARTERY EXPLORATION  09/07/2018   Procedure: FEMORAL ARTERY EXPLORATION AND REVISION SAPHENOUS VEIN HARVEST FROM RIGHT LEG;  Surgeon: Louisa Second, MD;  Location: [facility];  Service: Vascular;;   IRRIGATION AND DEBRIDEMENT FOOT Bilateral 05/25/2018   Procedure: IRRIGATION AND DEBRIDEMENT FOOT application wound vac left  foot;  Surgeon: Recardo Evangelist, DPM;  Location: [facility];  Service: Podiatry;  Laterality: Bilateral;   IRRIGATION AND DEBRIDEMENT FOOT Right 07/11/2018   Procedure: FOOT DEBRIDEMENT;  Surgeon: Gwyneth Revels, DPM;  Location: [facility];  Service: Podiatry;  Laterality: Right;   LOWER EXTREMITY ANGIOGRAPHY Left 05/26/2018   Procedure: Lower Extremity Angiography with possible intervention;  Surgeon: Annice Needy, MD;  Location: [facility];  Service: Cardiovascular;  Laterality: Left;   LOWER EXTREMITY ANGIOGRAPHY Right 05/27/2018   Procedure: Lower Extremity Angiography;  Surgeon: Renford Dills, MD;  Location: [facility];  Service: Cardiovascular;  Laterality: Right;   LOWER EXTREMITY ANGIOGRAPHY Right 07/15/2018   Procedure: Lower Extremity Angiography;  Surgeon: Renford Dills, MD;  Location: [facility];  Service: Cardiovascular;  Laterality: Right;   WOUND EXPLORATION Left 09/07/2018   Procedure: WOUND EXPLORATION;  Surgeon: Louisa Second, MD;  Location: [facility];  Service: Vascular;  Laterality: Left;    Allergies: Eggs or egg-derived products  Medications: Prior to  Admission medications   Medication Sig Start Date End Date Taking? Authorizing Provider  albuterol (PROVENTIL HFA;VENTOLIN HFA) 108 (90 Base) MCG/ACT inhaler Inhale 2 puffs into the lungs every 6 (six) hours as needed for wheezing or shortness of breath.    [provider]  albuterol (PROVENTIL)  (2.5 MG/3ML) 0.083% nebulizer solution Inhale 2.5 mg into the lungs every 6 (six) hours as needed for wheezing or shortness of breath.     [provider]  ALPRAZolam Prudy Feeler) 0.5 MG tablet Take 1 tablet (0.5 mg total) by mouth 2 (two) times daily as needed for anxiety. 07/26/18   Adrian Saran, MD  aspirin 81 MG chewable tablet Chew 81 mg by mouth daily. 05/30/17   [provider]  atorvastatin (LIPITOR) 40 MG tablet Take 40 mg by mouth daily. 10/24/17 10/24/18  [provider]  collagenase (SANTYL) ointment Apply topically daily. 08/25/18   Auburn Bilberry, MD  ferrous sulfate 325 (65 FE) MG tablet Take 1 tablet (325 mg total) by mouth 2 (two) times daily with a meal for 30 days. 09/13/18 10/13/18  Adrian Saran, MD  Fluticasone-Salmeterol (ADVAIR DISKUS) 250-50 MCG/DOSE AEPB Inhale 1 puff into the lungs 2 (two) times daily. 03/14/15 01/16/26  Beers, Charmayne Sheer, PA-C  gabapentin (NEURONTIN) 100 MG capsule Take 1 capsule (100 mg total) by mouth at bedtime. 07/26/18   Adrian Saran, MD  guaiFENesin-dextromethorphan (ROBITUSSIN DM) 100-10 MG/5ML syrup Take 5 mLs by mouth every 4 (four) hours as needed for cough. 04/11/18   Milagros Loll, MD  insulin glargine (LANTUS) 100 UNIT/ML injection Inject 0.08 mLs (8 Units total) into the skin at bedtime. 09/13/18   Adrian Saran, MD  ipratropium-albuterol (DUONEB) 0.5-2.5 (3) MG/3ML SOLN Take 3 mLs by nebulization 3 (three) times daily. 08/25/18   Auburn Bilberry, MD  linezolid (ZYVOX) 600 MG tablet Take 1 tablet (600 mg total) by mouth 2 (two) times daily. 09/11/18   Ihor Austin, MD  metFORMIN (GLUCOPHAGE) 500 MG tablet Take 500 mg by mouth daily with breakfast.  06/13/18   [provider]  metoprolol tartrate (LOPRESSOR) 25 MG tablet Take 12.5 mg by mouth 2 (two) times daily. 09/13/18   [provider]  midodrine (PROAMATINE) 10 MG tablet Take 1 tablet (10 mg total) by mouth 3 (three) times daily with meals. 07/26/18   Adrian Saran, MD    pantoprazole (PROTONIX) 40 MG tablet Take 1 tablet (40 mg total) by mouth daily. 09/13/18   Adrian Saran, MD  polyethylene glycol (MIRALAX / GLYCOLAX) packet Take 17 g by mouth daily. 06/14/18   Altamese Dilling, MD  potassium chloride (K-DUR,KLOR-CON) 10 MEQ tablet Take by mouth. 06/13/18   [provider]  protein supplement shake (PREMIER PROTEIN) LIQD Take 325 mLs (11 oz total) by mouth 2 (two) times daily between meals. 06/14/18   Altamese Dilling, MD  rivaroxaban (XARELTO) 10 MG TABS tablet Take 10 mg by mouth every evening.    [provider]  SPIRIVA HANDIHALER 18 MCG inhalation capsule Place 18 mcg into inhaler and inhale daily.  05/19/18   [provider]  tamsulosin (FLOMAX) 0.4 MG CAPS capsule Take 1 capsule (0.4 mg total) by mouth daily. 06/14/18   Altamese Dilling, MD  vitamin C (VITAMIN C) 500 MG tablet Take 1 tablet (500 mg total) by mouth 2 (two) times daily. 06/13/18   Altamese Dilling, MD     Family History  Problem Relation Age of Onset   Brain cancer Mother     Social History  Socioeconomic History   Marital status: Divorced    Spouse name: Not on file   Number of children: 2   Years of education: Not on file   Highest education level: Not on file  Occupational History   Occupation: Retired   Ecologistocial Needs   Financial resource strain: Not hard at [employer]   Food insecurity:    Worry: Never true    Inability: Never true   Transportation needs:    Medical: No    Non-medical: No  Tobacco Use   Smoking status: Current Every Day Smoker    Packs/day: 1.50    Years: 47.00    Pack years: 70.50    Types: Cigarettes   Smokeless tobacco: Never Used   Tobacco comment: Smoking History 2.5-started at age 63  Substance and Sexual Activity   Alcohol use: No    Alcohol/week: 0.0 standard drinks   Drug use: No   Sexual activity: Not Currently  Lifestyle   Physical activity:    Days per week: Patient  refused    Minutes per session: Patient refused   Stress: Very much  Relationships   Social connections:    Talks on phone: Patient refused    Gets together: Patient refused    Attends religious service: Patient refused    Active member of club or organization: Patient refused    Attends meetings of clubs or organizations: Patient refused    Relationship status: Patient refused  Other Topics Concern   Not on file  Social History Narrative   Not on file     Review of Systems: A 12 point ROS discussed and pertinent positives are indicated in the HPI above.  All other systems are negative.  Review of Systems  Vital Signs: BP 111/63    Pulse 97    Temp 98.5 F (36.9 C) (Oral)    Resp 16    Ht 5\' 1"  (1.549 m)    Wt 48.6 kg    SpO2 97%    BMI 20.24 kg/m   Physical Exam Constitutional:      Appearance: She is ill-appearing.  HENT:     Head: Normocephalic and atraumatic.  Cardiovascular:     Rate and Rhythm: Normal rate and regular rhythm.  Pulmonary:     Effort: Pulmonary effort is normal.     Breath sounds: Normal breath sounds.  Skin:    General: Skin is warm and dry.  Neurological:     General: No focal deficit present.     Imaging: Ct Pelvis W Contrast  Result Date: 09/21/2018 CLINICAL DATA:  Worsening posterior pelvic wound. EXAM: CT PELVIS WITH CONTRAST TECHNIQUE: Multidetector CT imaging of the pelvis was performed using the standard protocol following the bolus administration of intravenous contrast. CONTRAST:  80mL OMNIPAQUE IOHEXOL 300 MG/ML  SOLN COMPARISON:  MRI 09/07/2018.  CT scan 09/05/2018. FINDINGS: Urinary Tract: Foley catheter noted in the bladder lumen. Urine and gas are seen in the bladder lumen. Circumferential bladder wall thickening evident. Bowel: No small bowel or colonic dilatation within the visualized abdomen. Vascular/Lymphatic: Patient is status post aortic endograft placement. No pelvic sidewall lymphadenopathy. Reproductive: The uterus is  unremarkable. There is no adnexal mass. Other:  Moderate volume intraperitoneal free fluid. Musculoskeletal: Diffuse body wall edema. A complex collection of fluid and gas is identified in the soft tissues of the posterior pelvis. This tracks from the subcutaneous fat superficial to the lower sacrum/coccyx to the left along the gluteus musculature although a lateral to the level  of the greater trochanter of the proximal femur. This lesion shows irregular rim enhancement with internal enhancing septations. Imaging features are compatible with an abscess with involvement of the gluteus musculature and subcutaneous fat. Although difficult to measure due to the shape, this abscess is roughly 16 x 4 x 9 cm. Open wound noted in the left groin with a collection of rim enhancing gas and fluid measuring 4.5 x 2.6 x 2.2 cm. This rim enhancing fluid collection tracks deep and inferiorly along the left SFA stent. IMPRESSION: 1. Large complex rim enhancing collection of fluid and gas is identified superficial to and involving the left gluteus muscles. This abscess tracks from the midline lower sacrum/coccygeal region laterally to the level of the greater trochanter of the proximal left femur. Imaging features compatible with abscess measuring approximately 16 x 4 x 9 cm. 2. A second rim enhancing collection of fluid and gas is identified in the left groin region, deep to the open wound. This collection measures 5 x 3 x 2 cm and tracks deep to a position immediately adjacent to the left femoral artery stent. 3. Diffuse body wall edema. 4. Moderate volume intraperitoneal free fluid. 5. Foley catheter tip is in the bladder lumen with circumferential bladder wall thickening. Residual urine noted within the bladder lumen and correlation for Foley malfunction suggested. Electronically Signed   By: Kennith Center M.D.   On: 09/21/2018 18:28   Ct Pelvis W Contrast  Result Date: 09/05/2018 CLINICAL DATA:  Here with ex-boyfriend. Pt has  had vascular surgery with balloons to legs. Wound opened. Recent amputation. Pt is diabetic. He has taken her back to vascular center 2 days ago for wound that he is concerned about. Was placed on a second round of antibiotics. Groin wound is swollen per family. Draining yellow fluid per family. No fever. Per family, infection under the skin of the buttocks. Severe pain in this region. EXAM: CT PELVIS WITH CONTRAST TECHNIQUE: Multidetector CT imaging of the pelvis was performed using the standard protocol following the bolus administration of intravenous contrast. CONTRAST:  42mL OMNIPAQUE IOHEXOL 300 MG/ML  SOLN COMPARISON:  06/07/2018 CT abdomen and pelvis FINDINGS: Urinary Tract: Distended urinary bladder. LOWER portions of the kidneys are unremarkable. Bowel: Moderate stool burden. No bowel dilatation or bowel wall thickening. Vascular/Lymphatic: Aorto bi-iliac stent grafts. Bilateral femoral artery stents. There is dense atherosclerosis of the native vessels. Along the LEFT proximal femoral artery there is an irregular heterogeneous gas and fluid collection with rim enhancement which measures 1.7 x 7.8 x 2.5 centimeters. Small amount of air is identified within the wound at the skin, consistent with the history of draining wound. There is no evidence for pseudoaneurysm or arteriovenous fistula. Reproductive:  The uterus is present.  No adnexal mass. Other:  No ascites.  Anterior abdominal wall is unremarkable. The LEFT gluteus maximus is enlarged and heterogeneous, measuring 5.4 centimeters, compared to 1.8 centimeters on the RIGHT. Vessels traverse the muscle and considerations include inflammation/infection, or resolving hematoma. Mass is significantly less likely given the normal appearance on the December scan. Given the heterogeneity, is difficult to determine if there is a drainable fluid collection in this region. Significant diffuse body wall edema. Musculoskeletal: No acute osseous abnormality.  IMPRESSION: 1. Heterogeneous gas and fluid collection adjacent to the LEFT femoral artery, suspicious for abscess. 2. Heterogeneous enlargement of the LEFT gluteus maximus, consistent with abscess, inflammation, or resolving hematoma. Given the heterogeneity, infection/inflammation is favored. Consider ultrasound to evaluate for drainable fluid collections. 3.  Distended urinary bladder. 4. Vascular stents. Electronically Signed   By: Norva Pavlov M.D.   On: 09/05/2018 15:25   Mr Pelvis Wo Contrast  Result Date: 09/07/2018 CLINICAL DATA:  Evaluate gluteal fluid collection. Recent bilateral femoral endarterectomies with left groin infection. EXAM: MRI PELVIS WITHOUT CONTRAST TECHNIQUE: Multiplanar multisequence MR imaging of the pelvis was performed. No intravenous contrast was administered. COMPARISON:  CT pelvis dated September 05, 2018. FINDINGS: Urinary Tract: No abnormality visualized. Foley catheter within the decompressed bladder. Bowel:  Unremarkable visualized pelvic bowel loops. Vascular/Lymphatic: 2.6 x 3.2 x 5.0 cm heterogeneous fluid collection adjacent to the distal left common femoral artery, draining to the skin. No enlarged pelvic lymph nodes. Reproductive:  No mass or other significant abnormality. Other:  Small amount of presacral and pelvic fluid. Musculoskeletal: No suspicious marrow signal abnormality. No hip joint effusion. No acute fracture or dislocation. Prominent asymmetric enlargement of the left gluteus maximus muscle which is edematous and demonstrates patchy areas of T1 hyperintensity. There is some ill-defined fluid within the muscle, but no discrete drainable fluid collection. There is also edema within the proximal left gluteus minimus and medius muscles, and the left adductor musculature. Diffuse anasarca with slightly asymmetric soft tissue swelling of the left proximal thigh. Small midline sacral decubitus ulcer. IMPRESSION: 1. Enlarged, edematous left gluteus maximus muscle with  areas of patchy T1 hyperintensity, likely reflecting a combination of intramuscular hemorrhage and myositis. No discrete abscess or hematoma. 2. 5.0 cm heterogeneous fluid collection adjacent to the distal left femoral artery, concerning for abscess. 3. Small midline sacral decubitus ulcer without evidence of underlying osteomyelitis. 4. Diffuse anasarca with asymmetric soft tissue swelling of the left proximal thigh, which could reflect superimposed cellulitis. Correlate with physical exam. Electronically Signed   By: Obie Dredge M.D.   On: 09/07/2018 10:53    Labs:  CBC: Recent Labs    09/13/18 0458 09/21/18 1653 09/22/18 0417 09/24/18 0347  WBC 11.1* 10.8* 8.1 8.1  HGB 8.7* 8.0* 7.9* 8.2*  HCT 28.7* 25.5* 24.7* 26.5*  PLT 245 182 149* 124*    COAGS: Recent Labs    07/12/18 1945 07/17/18 0526 07/24/18 0448 09/06/18 0519 09/10/18 0553 09/24/18 1237  INR  --  1.17 1.22 1.1 1.2 1.5*  APTT 76* 73*  --   --  39* 41*    BMP: Recent Labs    09/08/18 0218 09/10/18 0553 09/21/18 1653 09/22/18 0417  NA 134* 135 134* 134*  K 4.4 4.3 4.3 4.1  CL 104 107 100 102  CO2 GLUCOSE 226* 99 62* 239*  BUN CALCIUM 7.5* 7.5* 7.7* 7.0*  CREATININE 0.60 0.56 0.56 0.50  GFRNONAA >60 >60 >60 >60  GFRAA >60 >60 >60 >60    LIVER FUNCTION TESTS: Recent Labs    05/22/18 2124 06/03/18 0345 06/10/18 0621 08/21/18 1313  BILITOT 0.3 0.6 0.7 0.9  AST 15 51* 23 50*  ALT 10 56* 28 49*  ALKPHOS 108 246* 149* 198*  PROT 7.0 6.5 5.9* 7.2  ALBUMIN 2.7* 2.2* 2.5* 3.0*    TUMOR MARKERS: No results for input(s): AFPTM, CEA, CA199, CHROMGRNA in the last 8760 hours.  Assessment and Plan:  Left gluteal abscess. Drain to follow.  Thank you for this interesting consult.  I greatly enjoyed meeting April [name] and look forward to participating in their care.  A copy of this report was sent to the requesting provider on this date.  Electronically Signed:  Art A  Che Rachal, MD 09/24/2018, 2:32 PM   I spent a total of 55 Miinutes  in face to face in clinical consultation, greater than 50% of which was counseling/coordinating care for abscess drain placement.

## 2018-09-24 NOTE — Progress Notes (Signed)
[employer] - [place] [facility]   PATIENT NAME: April [name]    MR#:  [id number]  DATE OF BIRTH:  [birth year]  SUBJECTIVE:  CHIEF COMPLAINT:   Chief Complaint  Patient presents with  . Wound Infection   Left hip pain. REVIEW OF SYSTEMS:  Review of Systems  Constitutional: Negative for chills, fever and malaise/fatigue.  HENT: Negative for sore throat.   Eyes: Negative for blurred vision and double vision.  Respiratory: Negative for cough, hemoptysis, shortness of breath, wheezing and stridor.   Cardiovascular: Negative for chest pain, palpitations, orthopnea and leg swelling.  Gastrointestinal: Negative for abdominal pain, blood in stool, diarrhea, melena, nausea and vomiting.  Genitourinary: Negative for dysuria, flank pain and hematuria.  Musculoskeletal: Positive for joint pain. Negative for back pain.  Skin: Negative for rash.  Neurological: Negative for dizziness, sensory change, focal weakness, seizures, loss of consciousness, weakness and headaches.  Endo/Heme/Allergies: Negative for polydipsia.  Psychiatric/Behavioral: Negative for depression. The patient is not nervous/anxious.     DRUG ALLERGIES:   Allergies  Allergen Reactions  . Eggs Or Egg-Derived Products Diarrhea, Itching and Nausea And Vomiting   VITALS:  Blood pressure (!) 102/53, pulse 96, temperature 98.6 F (37 C), temperature source Oral, resp. rate 16, height 5\' 1"  (1.549 m), weight 48.6 kg, SpO2 91 %. PHYSICAL EXAMINATION:  Physical Exam Constitutional:      General: She is not in acute distress.    Appearance: Normal appearance.  HENT:     Head: Normocephalic.     Mouth/Throat:     Mouth: Mucous membranes are moist.  Eyes:     General: No scleral icterus.    Conjunctiva/sclera: Conjunctivae normal.     Pupils: Pupils are equal, round, and reactive to light.  Neck:     Musculoskeletal: Normal range of motion and neck supple.     Vascular: No JVD.     Trachea: No  tracheal deviation.  Cardiovascular:     Rate and Rhythm: Normal rate and regular rhythm.     Heart sounds: Normal heart sounds. No murmur. No gallop.   Pulmonary:     Effort: Pulmonary effort is normal. No respiratory distress.     Breath sounds: Normal breath sounds. No wheezing or rales.  Abdominal:     General: Bowel sounds are normal. There is no distension.     Palpations: Abdomen is soft.     Tenderness: There is no abdominal tenderness. There is no rebound.  Musculoskeletal:        General: No tenderness.     Comments: Left thigh tenderness and swelling, in dressing. VAC therapy to the left femoral wound   Skin:    Findings: No erythema or rash.  Neurological:     Mental Status: She is alert and oriented to person, place, and time.     Cranial Nerves: No cranial nerve deficit.    LABORATORY PANEL:  Female CBC Recent Labs  Lab 09/24/18 0347  WBC 8.1  HGB 8.2*  HCT 26.5*  PLT 124*   ------------------------------------------------------------------------------------------------------------------ Chemistries  Recent Labs  Lab 09/22/18 0417  NA 134*  K 4.1  CL 102  CO2 24  GLUCOSE 239*  BUN 8  CREATININE 0.50  CALCIUM 7.0*   RADIOLOGY:  No results found. ASSESSMENT AND PLAN:   Surgical site infection of left thigh, Gluteal Abscess.. Continue IV zyvox and Zosyn, follow-up surgeon and the interventional radiologist for drainage.    Diabetes (HCC) -sliding scale insulin coverage, continue  home Lantus 8 units at bedtime.   COPD (chronic obstructive pulmonary disease) (HCC) -home dose inhalers, stable.   PAD (peripheral artery disease) (HCC) -Hold Xarelto and aspirin for possible surgery. Left BKA stump draining.  Per vascular surgery consult, continued VAC therapy to the left femoral wound with optimization for wound healing.     HLD (hyperlipidemia) -home dose antilipid Tobacco abuse.  Smoking cessation was counseled for 3 to 4 minutes, nicotine patch.   Discussed with surgical PA. All the records are reviewed and case discussed with Care Management/Social Worker. Management plans discussed with the patient, family and they are in agreement.  CODE STATUS: Full Code  TOTAL TIME TAKING CARE OF THIS PATIENT: 28 minutes.   More than 50% of the time was spent in counseling/coordination of care: YES  POSSIBLE D/C IN >3 DAYS, DEPENDING ON CLINICAL CONDITION.   Shaune Pollack M.D on 09/24/2018 at 11:44 AM  Between 7am to 6pm - Pager - [phone]  After 6pm go to [email or web address] - Therapist, nutritional Hospitalists

## 2018-09-24 NOTE — Progress Notes (Signed)
Advanced patient's diet to cardiac/carb diet post procedure per order. Patient has only taken sips of water with medicatons and soda as oral intake. Tried to encourage patient to drink ordered ensure and or other alternatives such as crackers. Patient stated she does not have an appetite.  Madie Reno, RN

## 2018-09-25 LAB — CBC
HCT: 24 % — ABNORMAL LOW (ref 36.0–46.0)
Hemoglobin: 7.5 g/dL — ABNORMAL LOW (ref 12.0–15.0)
MCH: 30 pg (ref 26.0–34.0)
MCHC: 31.3 g/dL (ref 30.0–36.0)
MCV: 96 fL (ref 80.0–100.0)
Platelets: 120 10*3/uL — ABNORMAL LOW (ref 150–400)
RBC: 2.5 MIL/uL — ABNORMAL LOW (ref 3.87–5.11)
RDW: 15.7 % — ABNORMAL HIGH (ref 11.5–15.5)
WBC: 7.9 10*3/uL (ref 4.0–10.5)
nRBC: 0 % (ref 0.0–0.2)

## 2018-09-25 LAB — GLUCOSE, CAPILLARY
Glucose-Capillary: 90 mg/dL (ref 70–99)
Glucose-Capillary: 154 mg/dL — ABNORMAL HIGH (ref 70–99)
Glucose-Capillary: 108 mg/dL — ABNORMAL HIGH (ref 70–99)
Glucose-Capillary: 88 mg/dL (ref 70–99)

## 2018-09-25 LAB — CALCIUM, IONIZED: Calcium, Ionized, Serum: 4.4 mg/dL — ABNORMAL LOW (ref 4.5–5.6)

## 2018-09-25 MED ORDER — SODIUM CHLORIDE 0.9 % IV SOLN
2.0000 g | Freq: Two times a day (BID) | INTRAVENOUS | Status: DC
  Administered 2018-09-25 – 2018-09-27 (×4): 2 g via INTRAVENOUS
  Filled 2018-09-25 (×7): qty 2

## 2018-09-25 MED ORDER — ADULT MULTIVITAMIN W/MINERALS CH
1.0000 | ORAL_TABLET | Freq: Every day | ORAL | Status: DC
  Administered 2018-09-25 – 2018-09-28 (×3): 1 via ORAL
  Filled 2018-09-25 (×4): qty 1

## 2018-09-25 MED ORDER — ENSURE MAX PROTEIN PO LIQD
11.0000 [oz_av] | Freq: Two times a day (BID) | ORAL | Status: DC
  Administered 2018-09-25 – 2018-09-28 (×4): 11 [oz_av] via ORAL
  Filled 2018-09-25: qty 330

## 2018-09-25 NOTE — Progress Notes (Addendum)
Initial Nutrition Assessment  DOCUMENTATION CODES:   Severe malnutrition in context of chronic illness  INTERVENTION:   Premier Protein no longer on formulary. Will change to its replacement Ensure Max Protein po BID, each supplement provides 150 kcal and 30 grams of protein.  MVI daily   Provide vitamin C 500 mg po BID  Liberalize diet   NUTRITION DIAGNOSIS:   Severe Malnutrition related to chronic illness(COPD, uncontrolled DM) as evidenced by moderate fat depletion, severe fat depletion, moderate muscle depletion, severe muscle depletion.  GOAL:   Patient will meet greater than or equal to 90% of their needs  MONITOR:   PO intake, Supplement acceptance, Labs, Weight trends, I & O's, Skin  REASON FOR ASSESSMENT:   Diagnosis    ASSESSMENT:   63 y.o.femalewith a largecomplexgluteal fluid collection (liquifying hematoma vs abscess) s/p Left femoral arterial stent and endarterectomy as well ascomorbidities including severe malnutrition, PAD s/p Left BKA, DM, HLD, COPD,pulmonary nodules,GERD,history of retroperitoneal abscess (2014), generalized anxiety disorder, and chronic ongoing tobacco abuse (smoking).   Pt s/p IR gluteal drain placement and VAC exchange today  Pt is will known to nutrition department and this RD from multiple previous admits. Pt generally eats fairly well when she is feeling good but has had poor appetite and oral intake over the past 3 weeks. Pt eating <25% of meals today. RD will add supplements and vitamins to support wound healing. RD will liberalize diet as pt not likely eating enough to exceed nutrient limits.  It is difficult to trend patient's weight as it has fluctuated significantly over time. She was 42.2 kg on 05/17/2017, 31.8 kg on 04/09/2018, 52.1 kg on 05/30/2018, 46.9 kg on 07/25/2018, 38.1 kg on 08/14/2018, and is currently 48.6 kg (107.14 lbs). Some of these weights are likely inaccurate, but it is difficult to tell what was a true  weight.  Medications reviewed and include: ferrous sulfate, insulin, protonix, zosyn  Labs reviewed: Na 134(L), Ca 7.0(L) Hgb 7.5(L), Hct 24.0(L) cbgs- 53, 54, 146, 90, 154 x 24 hrs AIC 7.9(H)- 2/20  Diet Order:   Diet Order            Diet regular Room service appropriate? Yes; Fluid consistency: Thin  Diet effective now             EDUCATION NEEDS:   Not appropriate for education at this time  Skin:  Skin Assessment: Reviewed RN Assessment(closed incision L BKA, incision groin, gluteal wound )  Last BM:  3/25- type 1  Height:   Ht Readings from Last 1 Encounters:  09/21/18 5\' 1"  (1.549 m)   Weight:   Wt Readings from Last 1 Encounters:  09/21/18 48.6 kg   Ideal Body Weight:  44.9 kg(adjusted for L BKA)  BMI:  Body mass index is 20.24 kg/m.  Estimated Nutritional Needs:   Kcal:  1200-1400kcal/day   Protein:  60-70g/day   Fluid:  1.2-1.4L/day   Betsey Holiday MS, RD, LDN Pager #- [phone] Office#- [phone] After Hours Pager: [phone]

## 2018-09-25 NOTE — Progress Notes (Signed)
[facility] Daily Progress Note   Vascular Surgery Communication Note  Removed VAC with Dr. Wyn Quaker present (09/24/18). VAC replaced. No need for groin debridement at this time. Continue VAC changes every other day with optimization of nutrition for wound healing. Can drain femoral artery collection surgically if patients changes mind and consents to surgery.  Will continue to follow.  Discussed with Dr. Wallis Mart Raianna Slight PA-C 09/25/2018 10:21 AM

## 2018-09-25 NOTE — Progress Notes (Signed)
   Date of Admission:  09/21/2018        Objective: Vital signs in last 24 hours: Temp:  [97.3 F (36.3 C)-99.1 F (37.3 C)] 98 F (36.7 C) (03/26 1136) Pulse Rate:  [89-104] 98 (03/26 1400) Resp:  [17-18] 17 (03/26 1136) BP: (92-110)/(47-55) 93/51 (03/26 1136) SpO2:  [94 %-95 %] 94 % (03/26 1136)  Left groin wound vac Left gluteal drain  Lab Results Recent Labs    09/24/18 0347 09/25/18 0426  WBC 8.1 7.9  HGB 8.2* 7.5*  HCT 26.5* 24.0*   Studies/Results: Ct Image Guided Drainage By Percutaneous Catheter  Result Date: 09/24/2018 INDICATION: Left gluteal abscess EXAM: CT GUIDED DRAINAGE OF LEFT GLUTEAL ABSCESS ABSCESS MEDICATIONS: The patient is currently admitted to the hospital and receiving intravenous antibiotics. The antibiotics were administered within an appropriate time frame prior to the initiation of the procedure. ANESTHESIA/SEDATION: One mg IV Versed 62.5 mcg IV Fentanyl Moderate Sedation Time:  17 minutes The patient was continuously monitored during the procedure by the interventional radiology nurse under my direct supervision. COMPLICATIONS: None immediate. TECHNIQUE: Informed written consent was obtained from the patient after a thorough discussion of the procedural risks, benefits and alternatives. All questions were addressed. Maximal Sterile Barrier Technique was utilized including caps, mask, sterile gowns, sterile gloves, sterile drape, hand hygiene and skin antiseptic. A timeout was performed prior to the initiation of the procedure. PROCEDURE: The left gluteal region was prepped with ChloraPrep in a sterile fashion, and a sterile drape was applied covering the operative field. A sterile gown and sterile gloves were used for the procedure. Local anesthesia was provided with 1% Lidocaine. Under CT guidance, an 18 gauge needle was advanced into the left gluteal abscess and removed over an Amplatz wire. Twelve [place] dilator followed by a 12 [place] drain were  inserted. It was looped and string fixed then sewn to the skin. Frank pus was aspirated. FINDINGS: Imaging documents placement of a 58 French left gluteal abscess drain. IMPRESSION: Successful left 12 French gluteal abscess drainage. Electronically Signed   By: Jolaine Click M.D.   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 Wound assessed by vascular , back on wound vac  On linezolid/zosyn- Dc the latter as platelet is decreasing . Start cefepime- await culture of gluteal abscess- if neg can just continue with linezolid  Watch closely platelet/Hb/ on linezolid   Left gluteal abscess- IR placed a drain- culture pending ?PAD - smoker- has had b/l femoral endarterectomy/stents  DM  Left BKA in Jan 2020- healed well  Rt foot surgical site healed  Discussed with pharmacist ID will follow her peripherally over the next three days-  Call if needed

## 2018-09-25 NOTE — TOC Progression Note (Signed)
Transition of Care [facility]) - Progression Note    Patient Details  Name: April [name] MRN: [id number] Date of Birth: 1956/03/29  [facility] of [facility]) CM/SW Contact  Chapman Fitch, RN Phone Number: 09/25/2018, 4:04 PM  Clinical Narrative:     RNCM called to follow up with POA Mr Mayford Knife.  Per Mr Mayford Knife plan remains for patient to return home at discharge with resumption of home health services through [facility]. Mr Mayford Knife confirms that wound vac from [facility] is still in the home  Patient will likely need to discharge with wet to dry dressing in place, then home health place the vac in the home.  Unless exceptions can be made to family to bring home vac to the hospital.   Mr Mayford Knife states that they have been having difficulty obtaining patient's medications at [facility]. Mr Mayford Knife states that per the pharmacy patient Medicaid was not showing, and they were not able to afford the copays.   RNCM called and spoke with Thayer Ohm at [facility].  Thayer Ohm states that medication coverage was not listed.  While on the phone Thayer Ohm was able to confirm that patient has active Medicaid coverage. Per Thayer Ohm Lantus and Novolog were written for on 08/29/18 and not picked up. Thayer Ohm was able to run Lantus through [facility] and the copay will be $3. Per Thayer Ohm "due to the way the novolog written Medicaid is requiring a prior authorization"  Per Thayer Ohm if insulin is indicated MD will need to electronically prescribe Lantus Pen, Novolog Pen, and Pen needles.  This should not require prior auth.  Thayer Ohm also request that any other prescriptions that will be needed to be electronically sent as well.  Thayer Ohm will be working 3/27 and available if any assistance is needed.    Expected Discharge Plan: Home w Home Health Services Barriers to Discharge: Continued Medical Work up  Expected Discharge Plan and Services Expected Discharge Plan: Home w Home Health Services   Discharge Planning  Services: CM Consult   Living arrangements for the past 2 months: Single Family Home Expected Discharge Date: 09/26/18                     [facility] Agency: [facility]   Social Determinants of Health (SDOH) Interventions    Readmission Risk Interventions Readmission Risk Prevention Plan 09/24/2018 09/11/2018  Transportation Screening Complete Complete  Medication Review [facility]) Referral to Pharmacy Complete  HRI or Home Care Consult Complete Complete  Palliative Care Screening (No Data) Complete  Skilled Nursing Facility - Patient Refused  Some recent data might be hidden

## 2018-09-25 NOTE — Progress Notes (Addendum)
[employer] [facility] [place] at [facility]   PATIENT NAME: April [name]    MR#:  [id number]  DATE OF BIRTH:  February 01, 1956  SUBJECTIVE:  CHIEF COMPLAINT:   Chief Complaint  Patient presents with  . Wound Infection   Left hip pain. On wound VAC with pink, yellow drainage. REVIEW OF SYSTEMS:  Review of Systems  Constitutional: Negative for chills, fever and malaise/fatigue.  HENT: Negative for sore throat.   Eyes: Negative for blurred vision and double vision.  Respiratory: Negative for cough, hemoptysis, shortness of breath, wheezing and stridor.   Cardiovascular: Negative for chest pain, palpitations, orthopnea and leg swelling.  Gastrointestinal: Negative for abdominal pain, blood in stool, diarrhea, melena, nausea and vomiting.  Genitourinary: Negative for dysuria, flank pain and hematuria.  Musculoskeletal: Positive for joint pain. Negative for back pain.  Skin: Negative for rash.  Neurological: Negative for dizziness, sensory change, focal weakness, seizures, loss of consciousness, weakness and headaches.  Endo/Heme/Allergies: Negative for polydipsia.  Psychiatric/Behavioral: Negative for depression. The patient is not nervous/anxious.     DRUG ALLERGIES:   Allergies  Allergen Reactions  . Eggs Or Egg-Derived Products Diarrhea, Itching and Nausea And Vomiting   VITALS:  Blood pressure (!) 93/51, pulse (!) 104, temperature 98 F (36.7 C), temperature source Oral, resp. rate 17, height 5\' 1"  (1.549 m), weight 48.6 kg, SpO2 94 %. PHYSICAL EXAMINATION:  Physical Exam Constitutional:      General: She is not in acute distress.    Appearance: Normal appearance.  HENT:     Head: Normocephalic.     Mouth/Throat:     Mouth: Mucous membranes are moist.  Eyes:     General: No scleral icterus.    Conjunctiva/sclera: Conjunctivae normal.     Pupils: Pupils are equal, round, and reactive to light.  Neck:     Musculoskeletal: Normal range of motion and neck  supple.     Vascular: No JVD.     Trachea: No tracheal deviation.  Cardiovascular:     Rate and Rhythm: Normal rate and regular rhythm.     Heart sounds: Normal heart sounds. No murmur. No gallop.   Pulmonary:     Effort: Pulmonary effort is normal. No respiratory distress.     Breath sounds: Normal breath sounds. No wheezing or rales.  Abdominal:     General: Bowel sounds are normal. There is no distension.     Palpations: Abdomen is soft.     Tenderness: There is no abdominal tenderness. There is no rebound.  Musculoskeletal:        General: No tenderness.     Comments: Left thigh tenderness and swelling, in dressing. VAC therapy to the left femoral wound   Skin:    Findings: No erythema or rash.  Neurological:     Mental Status: She is alert and oriented to person, place, and time.     Cranial Nerves: No cranial nerve deficit.    LABORATORY PANEL:  Female CBC Recent Labs  Lab 09/25/18 0426  WBC 7.9  HGB 7.5*  HCT 24.0*  PLT 120*   ------------------------------------------------------------------------------------------------------------------ Chemistries  Recent Labs  Lab 09/22/18 0417  NA 134*  K 4.1  CL 102  CO2 24  GLUCOSE 239*  BUN 8  CREATININE 0.50  CALCIUM 7.0*   RADIOLOGY:  Ct Image Guided Drainage By Percutaneous Catheter  Result Date: 09/24/2018 INDICATION: Left gluteal abscess EXAM: CT GUIDED DRAINAGE OF LEFT GLUTEAL ABSCESS ABSCESS MEDICATIONS: The patient is currently admitted to  the hospital and receiving intravenous antibiotics. The antibiotics were administered within an appropriate time frame prior to the initiation of the procedure. ANESTHESIA/SEDATION: One mg IV Versed 62.5 mcg IV Fentanyl Moderate Sedation Time:  17 minutes The patient was continuously monitored during the procedure by the interventional radiology nurse under my direct supervision. COMPLICATIONS: None immediate. TECHNIQUE: Informed written consent was obtained from the patient  after a thorough discussion of the procedural risks, benefits and alternatives. All questions were addressed. Maximal Sterile Barrier Technique was utilized including caps, mask, sterile gowns, sterile gloves, sterile drape, hand hygiene and skin antiseptic. A timeout was performed prior to the initiation of the procedure. PROCEDURE: The left gluteal region was prepped with ChloraPrep in a sterile fashion, and a sterile drape was applied covering the operative field. A sterile gown and sterile gloves were used for the procedure. Local anesthesia was provided with 1% Lidocaine. Under CT guidance, an 18 gauge needle was advanced into the left gluteal abscess and removed over an Amplatz wire. Twelve [place] dilator followed by a 12 [place] drain were inserted. It was looped and string fixed then sewn to the skin. Frank pus was aspirated. FINDINGS: Imaging documents placement of a 65 French left gluteal abscess drain. IMPRESSION: Successful left 12 French gluteal abscess drainage. Electronically Signed   By: Jolaine Click M.D.   On: 09/24/2018 15:58   ASSESSMENT AND PLAN:   Surgical site infection of left thigh, Gluteal Abscess.. Continue IV zyvox and Zosyn. S/p CT IMAGE GUIDED DRAINAGE BY PERCUTANEOUS CATHETER by radiologist. Follow-up CBC while on Zyvox.    Diabetes (HCC) -sliding scale insulin coverage, continue home Lantus 8 units at bedtime.   COPD (chronic obstructive pulmonary disease) (HCC) -home dose inhalers, stable.   PAD (peripheral artery disease) (HCC) -Hold Xarelto and aspirin for possible surgery. Left femoral wound and Left BKA stump draining.  Per vascular surgery consult, continued VAC therapy to the left femoral wound with optimization for wound healing. Can drain femoral artery collection surgically if patients changes mind and consents to surgery per Dr. Wyn Quaker.  Anemia of chronic disease.  Follow-up hemoglobin.    HLD (hyperlipidemia) -home dose antilipid Tobacco abuse.  Smoking  cessation was counseled for 3 to 4 minutes, nicotine patch.  Discussed with surgical PA. All the records are reviewed and case discussed with Care Management/Social Worker. Management plans discussed with the patient, family and they are in agreement.  CODE STATUS: Full Code  TOTAL TIME TAKING CARE OF THIS PATIENT: 28 minutes.   More than 50% of the time was spent in counseling/coordination of care: YES  POSSIBLE D/C IN >3 DAYS, DEPENDING ON CLINICAL CONDITION.   Shaune Pollack M.D on 09/25/2018 at 1:14 PM  Between 7am to 6pm - Pager - [phone]  After 6pm go to [email or web address] - Therapist, nutritional Hospitalists

## 2018-09-26 LAB — CULTURE, BLOOD (ROUTINE X 2)
Culture: NO GROWTH
Culture: NO GROWTH
SPECIAL REQUESTS: ADEQUATE

## 2018-09-26 LAB — CBC
HCT: 28.3 % — ABNORMAL LOW (ref 36.0–46.0)
Hemoglobin: 8.6 g/dL — ABNORMAL LOW (ref 12.0–15.0)
MCH: 29.9 pg (ref 26.0–34.0)
MCHC: 30.4 g/dL (ref 30.0–36.0)
MCV: 98.3 fL (ref 80.0–100.0)
Platelets: 159 10*3/uL (ref 150–400)
RBC: 2.88 MIL/uL — ABNORMAL LOW (ref 3.87–5.11)
RDW: 15.6 % — ABNORMAL HIGH (ref 11.5–15.5)
WBC: 9.4 10*3/uL (ref 4.0–10.5)
nRBC: 0 % (ref 0.0–0.2)

## 2018-09-26 LAB — BASIC METABOLIC PANEL
Anion gap: 9 (ref 5–15)
BUN: 18 mg/dL (ref 8–23)
CO2: 22 mmol/L (ref 22–32)
Calcium: 7.8 mg/dL — ABNORMAL LOW (ref 8.9–10.3)
Chloride: 107 mmol/L (ref 98–111)
Creatinine, Ser: 0.88 mg/dL (ref 0.44–1.00)
GFR calc Af Amer: >60 mL/min (ref >60)
GFR calc non Af Amer: >60 mL/min (ref >60)
Glucose, Bld: 107 mg/dL — ABNORMAL HIGH (ref 70–99)
Potassium: 3.5 mmol/L (ref 3.5–5.1)
Sodium: 138 mmol/L (ref 135–145)

## 2018-09-26 LAB — MAGNESIUM: Magnesium: 1.9 mg/dL (ref 1.7–2.4)

## 2018-09-26 LAB — GLUCOSE, CAPILLARY
Glucose-Capillary: 97 mg/dL (ref 70–99)
Glucose-Capillary: 105 mg/dL — ABNORMAL HIGH (ref 70–99)
Glucose-Capillary: 95 mg/dL (ref 70–99)
Glucose-Capillary: 96 mg/dL (ref 70–99)

## 2018-09-26 MED ORDER — RIVAROXABAN 10 MG PO TABS
10.0000 mg | ORAL_TABLET | Freq: Every evening | ORAL | Status: DC
  Administered 2018-09-26 – 2018-09-27 (×2): 10 mg via ORAL
  Filled 2018-09-26 (×3): qty 1

## 2018-09-26 MED ORDER — CALCIUM CARBONATE ANTACID 500 MG PO CHEW
1.0000 | CHEWABLE_TABLET | Freq: Three times a day (TID) | ORAL | Status: AC
  Administered 2018-09-26 (×3): 200 mg via ORAL
  Filled 2018-09-26 (×3): qty 1

## 2018-09-26 MED ORDER — SODIUM CHLORIDE 0.9 % IV SOLN
INTRAVENOUS | Status: AC
  Administered 2018-09-26: 10:00:00 via INTRAVENOUS

## 2018-09-26 NOTE — Discharge Instructions (Signed)
Vascular Surgery Discharge Instructions 1) Continue VAC changes to left groin either M-W-F or T-TH-S 2) Settings: 125, low, continuous

## 2018-09-26 NOTE — Care Management Important Message (Signed)
Important Message  Patient Details  Name: MAKAYELA HARNER MRN: [id number] Date of Birth: 06/29/1956   Medicare Important Message Given:  Yes. Given to nurse to place in room    Gwenette Greet, RN 09/26/2018, 12:35 PM

## 2018-09-26 NOTE — Progress Notes (Signed)
[employer] - [place] at [facility]   PATIENT NAME: April [name]    MR#:  [id number]  DATE OF BIRTH:  Mar 17, 1956  SUBJECTIVE:  CHIEF COMPLAINT:   Chief Complaint  Patient presents with  . Wound Infection   The patient is drowsy and has nausea. On wound VAC with pink, yellow drainage.  Blood pressure is low. REVIEW OF SYSTEMS:  Review of Systems  Constitutional: Positive for malaise/fatigue. Negative for chills and fever.  HENT: Negative for sore throat.   Eyes: Negative for blurred vision and double vision.  Respiratory: Negative for cough, hemoptysis, shortness of breath, wheezing and stridor.   Cardiovascular: Negative for chest pain, palpitations, orthopnea and leg swelling.  Gastrointestinal: Positive for nausea. Negative for abdominal pain, blood in stool, diarrhea, melena and vomiting.  Genitourinary: Negative for dysuria, flank pain and hematuria.  Musculoskeletal: Positive for joint pain. Negative for back pain.  Skin: Negative for rash.  Neurological: Negative for dizziness, sensory change, focal weakness, seizures, loss of consciousness, weakness and headaches.  Endo/Heme/Allergies: Negative for polydipsia.  Psychiatric/Behavioral: Negative for depression. The patient is not nervous/anxious.     DRUG ALLERGIES:   Allergies  Allergen Reactions  . Eggs Or Egg-Derived Products Diarrhea, Itching and Nausea And Vomiting   VITALS:  Blood pressure (!) 99/42, pulse 99, temperature 98.5 F (36.9 C), temperature source Oral, resp. rate 17, height 5\' 1"  (1.549 m), weight 48.6 kg, SpO2 94 %. PHYSICAL EXAMINATION:  Physical Exam Constitutional:      General: She is not in acute distress.    Appearance: Normal appearance.  HENT:     Head: Normocephalic.     Mouth/Throat:     Mouth: Mucous membranes are moist.  Eyes:     General: No scleral icterus.    Conjunctiva/sclera: Conjunctivae normal.     Pupils: Pupils are equal, round, and reactive to  light.  Neck:     Musculoskeletal: Normal range of motion and neck supple.     Vascular: No JVD.     Trachea: No tracheal deviation.  Cardiovascular:     Rate and Rhythm: Normal rate and regular rhythm.     Heart sounds: Normal heart sounds. No murmur. No gallop.   Pulmonary:     Effort: Pulmonary effort is normal. No respiratory distress.     Breath sounds: Normal breath sounds. No wheezing or rales.  Abdominal:     General: Bowel sounds are normal. There is no distension.     Palpations: Abdomen is soft.     Tenderness: There is no abdominal tenderness. There is no rebound.  Musculoskeletal:        General: No tenderness.     Comments: Left thigh tenderness and swelling, in dressing. VAC therapy to the left femoral wound   Skin:    Findings: No erythema or rash.  Neurological:     Mental Status: She is alert.     Cranial Nerves: No cranial nerve deficit.     Comments: Drowsy    LABORATORY PANEL:  Female CBC Recent Labs  Lab 09/26/18 0626  WBC 9.4  HGB 8.6*  HCT 28.3*  PLT 159   ------------------------------------------------------------------------------------------------------------------ Chemistries  Recent Labs  Lab 09/26/18 0626  NA 138  K 3.5  CL 107  CO2 22  GLUCOSE 107*  BUN 18  CREATININE 0.88  CALCIUM 7.8*  MG 1.9   RADIOLOGY:  No results found. ASSESSMENT AND PLAN:   Surgical site infection of left thigh, Gluteal Abscess.. Continue  IV zyvox and Zosyn. S/p CT IMAGE GUIDED DRAINAGE BY PERCUTANEOUS CATHETER by radiologist. Follow-up CBC while on Zyvox.    Diabetes (HCC) -sliding scale insulin coverage, continue home Lantus 8 units at bedtime.   COPD (chronic obstructive pulmonary disease) (HCC) -home dose inhalers, stable.   PAD (peripheral artery disease) (HCC) -Hold Xarelto and aspirin for possible surgery.  Resume Xarelto. Left femoral wound and Left BKA stump draining.  Per vascular surgery consult, continued VAC therapy to the left femoral  wound with optimization for wound healing. Can drain femoral artery collection surgically if patients changes mind and consents to surgery per Dr. Wyn Quaker.  Anemia of chronic disease.  Hemoglobin is stable.    HLD (hyperlipidemia) -home dose antilipid Tobacco abuse.  Smoking cessation was counseled for 3 to 4 minutes, nicotine patch.  All the records are reviewed and case discussed with Care Management/Social Worker. Management plans discussed with the patient, family and they are in agreement.  CODE STATUS: Full Code  TOTAL TIME TAKING CARE OF THIS PATIENT: 27 minutes.   More than 50% of the time was spent in counseling/coordination of care: YES  POSSIBLE D/C IN 3 DAYS, DEPENDING ON CLINICAL CONDITION.   Shaune Pollack M.D on 09/26/2018 at 2:14 PM  Between 7am to 6pm - Pager - [phone]  After 6pm go to [email or web address] - Therapist, nutritional Hospitalists

## 2018-09-27 LAB — GLUCOSE, CAPILLARY
Glucose-Capillary: 145 mg/dL — ABNORMAL HIGH (ref 70–99)
Glucose-Capillary: 147 mg/dL — ABNORMAL HIGH (ref 70–99)
Glucose-Capillary: 149 mg/dL — ABNORMAL HIGH (ref 70–99)
Glucose-Capillary: 173 mg/dL — ABNORMAL HIGH (ref 70–99)

## 2018-09-27 LAB — CBC
HCT: 27.1 % — ABNORMAL LOW (ref 36.0–46.0)
Hemoglobin: 8.2 g/dL — ABNORMAL LOW (ref 12.0–15.0)
MCH: 29.9 pg (ref 26.0–34.0)
MCHC: 30.3 g/dL (ref 30.0–36.0)
MCV: 98.9 fL (ref 80.0–100.0)
Platelets: 187 10*3/uL (ref 150–400)
RBC: 2.74 MIL/uL — ABNORMAL LOW (ref 3.87–5.11)
RDW: 15.8 % — ABNORMAL HIGH (ref 11.5–15.5)
WBC: 13.2 10*3/uL — ABNORMAL HIGH (ref 4.0–10.5)
nRBC: 0.8 % — ABNORMAL HIGH (ref 0.0–0.2)

## 2018-09-27 LAB — PREPARE RBC (CROSSMATCH)

## 2018-09-27 LAB — BASIC METABOLIC PANEL
Anion gap: 14 (ref 5–15)
BUN: 28 mg/dL — ABNORMAL HIGH (ref 8–23)
CALCIUM: 7.8 mg/dL — AB (ref 8.9–10.3)
CO2: 16 mmol/L — ABNORMAL LOW (ref 22–32)
Chloride: 108 mmol/L (ref 98–111)
Creatinine, Ser: 1.48 mg/dL — ABNORMAL HIGH (ref 0.44–1.00)
GFR calc Af Amer: 43 mL/min — ABNORMAL LOW (ref >60)
GFR calc non Af Amer: 37 mL/min — ABNORMAL LOW (ref >60)
Glucose, Bld: 146 mg/dL — ABNORMAL HIGH (ref 70–99)
Potassium: 4.2 mmol/L (ref 3.5–5.1)
SODIUM: 138 mmol/L (ref 135–145)

## 2018-09-27 MED ORDER — SODIUM CHLORIDE 0.9 % IV BOLUS
1000.0000 mL | Freq: Once | INTRAVENOUS | Status: AC
  Administered 2018-09-27: 1000 mL via INTRAVENOUS

## 2018-09-27 MED ORDER — SODIUM CHLORIDE 0.9 % IV SOLN
2.0000 g | INTRAVENOUS | Status: DC
  Administered 2018-09-28: 2 g via INTRAVENOUS
  Filled 2018-09-27: qty 2

## 2018-09-27 MED ORDER — SODIUM CHLORIDE 0.9 % IV BOLUS
500.0000 mL | Freq: Once | INTRAVENOUS | Status: AC
  Administered 2018-09-27: 500 mL via INTRAVENOUS

## 2018-09-27 MED ORDER — FLUDROCORTISONE ACETATE 0.1 MG PO TABS
0.1000 mg | ORAL_TABLET | Freq: Every day | ORAL | Status: DC
  Administered 2018-09-27 – 2018-09-28 (×2): 0.1 mg via ORAL
  Filled 2018-09-27 (×2): qty 1

## 2018-09-27 MED ORDER — SODIUM CHLORIDE 0.9% IV SOLUTION
Freq: Once | INTRAVENOUS | Status: AC
  Administered 2018-09-27: 16:00:00 via INTRAVENOUS

## 2018-09-27 NOTE — Progress Notes (Signed)
PHARMACY NOTE:  ANTIMICROBIAL RENAL DOSAGE ADJUSTMENT  Current antimicrobial regimen includes a mismatch between antimicrobial dosage and estimated renal function.  As per policy approved by the Pharmacy & Therapeutics and Medical Executive Committees, the antimicrobial dosage will be adjusted accordingly.  Current antimicrobial dosage:  Cefepime 2 g IV q12h  Indication: wound infection  Renal Function:  Estimated Creatinine Clearance: 29.4 mL/min (A) (by C-G formula based on SCr of 1.48 mg/dL (H)).  Antimicrobial dosage has been changed to:  Cefepime 2 g IV q24h  Additional comments:   Thank you for allowing pharmacy to be a part of this patient's care.  Marty Heck, [facility] 09/27/2018 3:17 PM

## 2018-09-27 NOTE — Progress Notes (Addendum)
[facility] [place] at [facility]   PATIENT NAME: April [name]    MR#:  [id number]  DATE OF BIRTH:  1956-04-21  SUBJECTIVE:  CHIEF COMPLAINT:   Chief Complaint  Patient presents with  . Wound Infection  Case discussed with nursing staff-blood pressure remains low despite fluid challenges/Midodrine, start Florinef, patient requesting pain medicine, wound VAC with pink, yellow drainage  REVIEW OF SYSTEMS:  Review of Systems  Constitutional: Positive for malaise/fatigue. Negative for chills and fever.  HENT: Negative for sore throat.   Eyes: Negative for blurred vision and double vision.  Respiratory: Negative for cough, hemoptysis, shortness of breath, wheezing and stridor.   Cardiovascular: Negative for chest pain, palpitations, orthopnea and leg swelling.  Gastrointestinal: Positive for nausea. Negative for abdominal pain, blood in stool, diarrhea, melena and vomiting.  Genitourinary: Negative for dysuria, flank pain and hematuria.  Musculoskeletal: Positive for joint pain. Negative for back pain.  Skin: Negative for rash.  Neurological: Negative for dizziness, sensory change, focal weakness, seizures, loss of consciousness, weakness and headaches.  Endo/Heme/Allergies: Negative for polydipsia.  Psychiatric/Behavioral: Negative for depression. The patient is not nervous/anxious.     DRUG ALLERGIES:   Allergies  Allergen Reactions  . Eggs Or Egg-Derived Products Diarrhea, Itching and Nausea And Vomiting   VITALS:  Blood pressure (!) 81/52, pulse (!) 114, temperature 98.1 F (36.7 C), temperature source Oral, resp. rate 16, height 5\' 1"  (1.549 m), weight 48.6 kg, SpO2 92 %. PHYSICAL EXAMINATION:  Physical Exam Constitutional:      General: She is not in acute distress.    Appearance: Normal appearance.  HENT:     Head: Normocephalic.     Mouth/Throat:     Mouth: Mucous membranes are moist.  Eyes:     General: No scleral icterus.  Conjunctiva/sclera: Conjunctivae normal.     Pupils: Pupils are equal, round, and reactive to light.  Neck:     Musculoskeletal: Normal range of motion and neck supple.     Vascular: No JVD.     Trachea: No tracheal deviation.  Cardiovascular:     Rate and Rhythm: Normal rate and regular rhythm.     Heart sounds: Normal heart sounds. No murmur. No gallop.   Pulmonary:     Effort: Pulmonary effort is normal. No respiratory distress.     Breath sounds: Normal breath sounds. No wheezing or rales.  Abdominal:     General: Bowel sounds are normal. There is no distension.     Palpations: Abdomen is soft.     Tenderness: There is no abdominal tenderness. There is no rebound.  Musculoskeletal:        General: No tenderness.     Comments: Left thigh tenderness and swelling, in dressing. VAC therapy to the left femoral wound   Skin:    Findings: No erythema or rash.  Neurological:     Mental Status: She is alert.     Cranial Nerves: No cranial nerve deficit.     Comments: Drowsy    LABORATORY PANEL:  Female CBC Recent Labs  Lab 09/27/18 0504  WBC 13.2*  HGB 8.2*  HCT 27.1*  PLT 187   ------------------------------------------------------------------------------------------------------------------ Chemistries  Recent Labs  Lab 09/26/18 0626 09/27/18 0504  NA 138 138  K 3.5 4.2  CL 107 108  CO2 22 16*  GLUCOSE 107* 146*  BUN 18 28*  CREATININE 0.88 1.48*  CALCIUM 7.8* 7.8*  MG 1.9  --    RADIOLOGY:  No results  found. ASSESSMENT AND PLAN:  *Acute left upper thigh surgical site infection/gluteal abscess Patient refused to have surgical treatment Continue IV zyvox/cefepime, infectious disease input appreciated   *Chronic diabetes mellitus type 2 Stable on current regiment  *COPD without exacerbation  Breathing treatments PRN   *PAD Xarelto restarted given patient's refusal to have surgery done Left femoral wound and Left BKA stump draining.  Per vascular surgery  consult, continued VAC therapy to the left femoral wound with optimization for wound healing. Can drain femoral artery collection surgically if patients changes mind and consents to surgery per Dr. Wyn Quaker.  *Acute on chronic symptomatic anemia  Most likely secondary to chronic disease  Hemoglobin stable, but blood pressure remains quite low, transfuse 1 unit packed red blood cells  *HLD  Continue statin therapy   *Chronic tobacco smoker abuse/dependency  Nicotine patch and cessation counseling ordered   All the records are reviewed and case discussed with Care Management/Social Worker. Management plans discussed with the patient, family and they are in agreement.  CODE STATUS: Full Code  TOTAL TIME TAKING CARE OF THIS PATIENT: 35 minutes.   More than 50% of the time was spent in counseling/coordination of care: YES  POSSIBLE D/C IN 1-3 DAYS, DEPENDING ON CLINICAL CONDITION.   Evelena Asa Jin Shockley M.D on 09/27/2018 at 12:14 PM  Between 7am to 6pm - Pager - [phone]  After 6pm go to [email or web address] - Therapist, [facility] Hospitalists

## 2018-09-28 LAB — TYPE AND SCREEN
ABO/RH(D): B POS
Antibody Screen: NEGATIVE
Unit division: 0

## 2018-09-28 LAB — CREATININE, SERUM
Creatinine, Ser: 2.12 mg/dL — ABNORMAL HIGH (ref 0.44–1.00)
GFR calc Af Amer: 28 mL/min — ABNORMAL LOW (ref >60)
GFR calc non Af Amer: 24 mL/min — ABNORMAL LOW (ref >60)

## 2018-09-28 LAB — CBC
HCT: 31.5 % — ABNORMAL LOW (ref 36.0–46.0)
HEMOGLOBIN: 10 g/dL — AB (ref 12.0–15.0)
MCH: 30.3 pg (ref 26.0–34.0)
MCHC: 31.7 g/dL (ref 30.0–36.0)
MCV: 95.5 fL (ref 80.0–100.0)
Platelets: 147 10*3/uL — ABNORMAL LOW (ref 150–400)
RBC: 3.3 MIL/uL — ABNORMAL LOW (ref 3.87–5.11)
RDW: 16.9 % — ABNORMAL HIGH (ref 11.5–15.5)
WBC: 12.1 10*3/uL — ABNORMAL HIGH (ref 4.0–10.5)
nRBC: 0.9 % — ABNORMAL HIGH (ref 0.0–0.2)

## 2018-09-28 LAB — BPAM RBC
Blood Product Expiration Date: 202004102359
ISSUE DATE / TIME: 202003281516
Unit Type and Rh: 1700

## 2018-09-28 LAB — BASIC METABOLIC PANEL
Anion gap: 14 (ref 5–15)
BUN: 40 mg/dL — ABNORMAL HIGH (ref 8–23)
CO2: 16 mmol/L — ABNORMAL LOW (ref 22–32)
Calcium: 7.8 mg/dL — ABNORMAL LOW (ref 8.9–10.3)
Chloride: 107 mmol/L (ref 98–111)
Creatinine, Ser: 2.09 mg/dL — ABNORMAL HIGH (ref 0.44–1.00)
GFR calc Af Amer: 28 mL/min — ABNORMAL LOW (ref >60)
GFR calc non Af Amer: 25 mL/min — ABNORMAL LOW (ref >60)
Glucose, Bld: 212 mg/dL — ABNORMAL HIGH (ref 70–99)
POTASSIUM: 4.4 mmol/L (ref 3.5–5.1)
Sodium: 137 mmol/L (ref 135–145)

## 2018-09-28 LAB — GLUCOSE, CAPILLARY: GLUCOSE-CAPILLARY: 200 mg/dL — AB (ref 70–99)

## 2018-09-28 MED ORDER — BIOTENE DRY MOUTH MT LIQD: 15.0000 mL | OROMUCOSAL | Status: DC | PRN

## 2018-09-28 MED ORDER — ACETAMINOPHEN 325 MG PO TABS: 650.0000 mg | ORAL_TABLET | Freq: Four times a day (QID) | ORAL | Status: DC | PRN

## 2018-09-28 MED ORDER — MORPHINE SULFATE (CONCENTRATE) 10 MG/0.5ML PO SOLN: 5.0000 mg | ORAL | Status: DC | PRN

## 2018-09-28 MED ORDER — SENNA 8.6 MG PO TABS: 1.0000 | ORAL_TABLET | Freq: Every evening | ORAL | Status: DC | PRN

## 2018-09-28 MED ORDER — FLUDROCORTISONE ACETATE 0.1 MG PO TABS: 0.1000 mg | ORAL_TABLET | Freq: Every day | ORAL | 0 refills | Status: AC

## 2018-09-28 MED ORDER — POLYVINYL ALCOHOL 1.4 % OP SOLN: 1.0000 [drp] | Freq: Four times a day (QID) | OPHTHALMIC | 0 refills | Status: AC | PRN

## 2018-09-28 MED ORDER — MAGIC MOUTHWASH
10.0000 mL | Freq: Four times a day (QID) | ORAL | Status: DC | PRN
  Filled 2018-09-28: qty 10

## 2018-09-28 MED ORDER — LORAZEPAM 1 MG PO TABS: 1.0000 mg | ORAL_TABLET | ORAL | Status: DC | PRN

## 2018-09-28 MED ORDER — LORAZEPAM 2 MG/ML PO CONC: 1.0000 mg | ORAL | Status: DC | PRN

## 2018-09-28 MED ORDER — ACETAMINOPHEN 325 MG PO TABS: 650.0000 mg | ORAL_TABLET | Freq: Four times a day (QID) | ORAL | 0 refills | Status: AC | PRN

## 2018-09-28 MED ORDER — MORPHINE SULFATE (CONCENTRATE) 10 MG/0.5ML PO SOLN: 5.0000 mg | ORAL | 0 refills | Status: AC | PRN

## 2018-09-28 MED ORDER — SODIUM CHLORIDE 0.9 % IV SOLN
Freq: Once | INTRAVENOUS | Status: AC
  Administered 2018-09-28: 08:00:00 via INTRAVENOUS

## 2018-09-28 MED ORDER — LORAZEPAM 2 MG/ML PO CONC: 1.0000 mg | ORAL | 0 refills | Status: AC | PRN

## 2018-09-28 MED ORDER — ONDANSETRON 4 MG PO TBDP: 4.0000 mg | ORAL_TABLET | Freq: Four times a day (QID) | ORAL | 0 refills | Status: AC | PRN

## 2018-09-28 MED ORDER — BIOTENE DRY MOUTH MT LIQD: 15.0000 mL | OROMUCOSAL | 0 refills | Status: AC | PRN

## 2018-09-28 MED ORDER — MAGIC MOUTHWASH W/LIDOCAINE: 15.0000 mL | Freq: Four times a day (QID) | ORAL | 0 refills | Status: AC | PRN

## 2018-09-28 MED ORDER — ONDANSETRON HCL 4 MG/2ML IJ SOLN: 4.0000 mg | Freq: Four times a day (QID) | INTRAMUSCULAR | Status: DC | PRN

## 2018-09-28 MED ORDER — MAGIC MOUTHWASH W/LIDOCAINE
15.0000 mL | Freq: Four times a day (QID) | ORAL | Status: DC | PRN
  Filled 2018-09-28: qty 15

## 2018-09-28 MED ORDER — LIDOCAINE VISCOUS HCL 2 % MT SOLN
5.0000 mL | Freq: Four times a day (QID) | OROMUCOSAL | Status: DC | PRN
  Filled 2018-09-28: qty 15

## 2018-09-28 MED ORDER — ACETAMINOPHEN 650 MG RE SUPP: 650.0000 mg | Freq: Four times a day (QID) | RECTAL | Status: DC | PRN

## 2018-09-28 MED ORDER — LORAZEPAM 2 MG/ML IJ SOLN: 1.0000 mg | INTRAMUSCULAR | Status: DC | PRN

## 2018-09-28 MED ORDER — ONDANSETRON 4 MG PO TBDP: 4.0000 mg | ORAL_TABLET | Freq: Four times a day (QID) | ORAL | Status: DC | PRN

## 2018-09-28 MED ORDER — POLYVINYL ALCOHOL 1.4 % OP SOLN
1.0000 [drp] | Freq: Four times a day (QID) | OPHTHALMIC | Status: DC | PRN
  Filled 2018-09-28: qty 15

## 2018-09-28 MED ORDER — SENNA 8.6 MG PO TABS: 1.0000 | ORAL_TABLET | Freq: Every evening | ORAL | 0 refills | Status: AC | PRN

## 2018-09-28 NOTE — Discharge Summary (Signed)
[facility] - [place] at [facility]   PATIENT NAME: April [name]    MR#:  [id number]  DATE OF BIRTH:  06-Jan-1956  DATE OF ADMISSION:  09/21/2018 ADMITTING PHYSICIAN: Oralia Manis, MD  DATE OF DISCHARGE: No discharge date for patient encounter.  PRIMARY CARE PHYSICIAN: Mickel Fuchs, MD    ADMISSION DIAGNOSIS:  Gluteal abscess [L02.31] Pressure injury of sacral region, unstageable (HCC) [L89.150]  DISCHARGE DIAGNOSIS:  Principal Problem:   Surgical site infection Active Problems:   Diabetes (HCC)   COPD (chronic obstructive pulmonary disease) (HCC)   HLD (hyperlipidemia)   PAD (peripheral artery disease) (HCC)   Surgical wound infection   SECONDARY DIAGNOSIS:   Past Medical History:  Diagnosis Date  . Anxiety   . Colon polyps   . COPD (chronic obstructive pulmonary disease) (HCC)   . Diabetes mellitus without complication (HCC)   . DVT (deep venous thrombosis) (HCC)   . DVT (deep venous thrombosis) (HCC)   . History of colonic polyps   . Hyperlipidemia   . Peripheral artery disease (HCC)   . Pulmonary nodules/lesions, multiple 08/2012   NEGATIVE PET SCAN  . Retroperitoneal abscess (HCC)    history of in 04/2013  . Tobacco abuse     HOSPITAL COURSE:  *Acute left upper thigh surgical site infection/gluteal abscess Worsening Patient refused to have surgical treatment Treated with IV zyvox/cefepime while in house, infectious disease did see pt while in house, has developed MOD w/ ARF, persistent hypotension dispite Midodrine/Florinef, pt not competent for decision-making, d/w pt's daughter/family friends via phone with all questions answered - they elected to make pt hospice/DNR with plans for home hospice latter today, CM/SW assissting with discharge   *Acute Multi-organ dysfunction Due to above Plan of care per above  *Chronic diabetes mellitus type 2 Plan of care per above  *COPD without exacerbation  Plan of care per  above  *PAD Xarelto restarted given patient's refusal to have surgery done Left femoral wound and Left BKA stump draining.  Per vascular surgery consult, continued VAC therapy to the left femoral wound with optimization for wound healing.  Plan of care per above  *Acute on chronic symptomatic anemia  Most likely secondary to chronic disease  Plan of care per above  *HLD  Plan of care per above  *Chronic tobacco smoker abuse/dependency  Plan of care per above  DISCHARGE CONDITIONS:   guarded  CONSULTS OBTAINED:  Treatment Team:  Lamont Dowdy, MD  DRUG ALLERGIES:   Allergies  Allergen Reactions  . Eggs Or Egg-Derived Products Diarrhea, Itching and Nausea And Vomiting    DISCHARGE MEDICATIONS:   Allergies as of 09/28/2018      Reactions   Eggs Or Egg-derived Products Diarrhea, Itching, Nausea And Vomiting      Medication List    STOP taking these medications   ALPRAZolam 0.5 MG tablet Commonly known as:  XANAX   ascorbic acid 500 MG tablet Commonly known as:  VITAMIN C   aspirin 81 MG chewable tablet   atorvastatin 40 MG tablet Commonly known as:  LIPITOR   ferrous sulfate 325 (65 FE) MG tablet   insulin glargine 100 UNIT/ML injection Commonly known as:  LANTUS   linezolid 600 MG tablet Commonly known as:  ZYVOX   metFORMIN 500 MG tablet Commonly known as:  GLUCOPHAGE   midodrine 10 MG tablet Commonly known as:  PROAMATINE   pantoprazole 40 MG tablet Commonly known as:  PROTONIX   polyethylene glycol packet Commonly  known as:  MIRALAX / GLYCOLAX   potassium chloride 10 MEQ tablet Commonly known as:  K-DUR,KLOR-CON   tamsulosin 0.4 MG Caps capsule Commonly known as:  FLOMAX     TAKE these medications   acetaminophen 325 MG tablet Commonly known as:  TYLENOL Take 2 tablets (650 mg total) by mouth every 6 (six) hours as needed for mild pain (or Fever >/= 101).   albuterol (2.5 MG/3ML) 0.083% nebulizer solution Commonly known as:   PROVENTIL Inhale 2.5 mg into the lungs every 6 (six) hours as needed for wheezing or shortness of breath.   albuterol 108 (90 Base) MCG/ACT inhaler Commonly known as:  PROVENTIL HFA;VENTOLIN HFA Inhale 2 puffs into the lungs every 6 (six) hours as needed for wheezing or shortness of breath.   antiseptic oral rinse Liqd Apply 15 mLs topically as needed for dry mouth.   collagenase ointment Commonly known as:  SANTYL Apply topically daily. What changed:  how much to take   fludrocortisone 0.1 MG tablet Commonly known as:  FLORINEF Take 1 tablet (0.1 mg total) by mouth daily. Start taking on:  September 29, 2018   Fluticasone-Salmeterol 250-50 MCG/DOSE Aepb Commonly known as:  Advair Diskus Inhale 1 puff into the lungs 2 (two) times daily.   gabapentin 100 MG capsule Commonly known as:  NEURONTIN Take 1 capsule (100 mg total) by mouth at bedtime.   guaiFENesin-dextromethorphan 100-10 MG/5ML syrup Commonly known as:  ROBITUSSIN DM Take 5 mLs by mouth every 4 (four) hours as needed for cough.   ipratropium-albuterol 0.5-2.5 (3) MG/3ML Soln Commonly known as:  DUONEB Take 3 mLs by nebulization 3 (three) times daily.   LORazepam 2 MG/ML concentrated solution Commonly known as:  ATIVAN Place 0.5 mLs (1 mg total) under the tongue every 4 (four) hours as needed for anxiety.   magic mouthwash w/lidocaine Soln Take 15 mLs by mouth every 6 (six) hours as needed for mouth pain (mouth pain / discomfort).   morphine CONCENTRATE 10 MG/0.5ML Soln concentrated solution Take 0.25 mLs (5 mg total) by mouth every 2 (two) hours as needed for moderate pain or severe pain (or dyspnea).   ondansetron 4 MG disintegrating tablet Commonly known as:  ZOFRAN-ODT Take 1 tablet (4 mg total) by mouth every 6 (six) hours as needed for nausea.   polyvinyl alcohol 1.4 % ophthalmic solution Commonly known as:  LIQUIFILM TEARS Place 1 drop into both eyes 4 (four) times daily as needed for dry eyes.    protein supplement shake Liqd Commonly known as:  PREMIER PROTEIN Take 325 mLs (11 oz total) by mouth 2 (two) times daily between meals.   senna 8.6 MG Tabs tablet Commonly known as:  SENOKOT Take 1 tablet (8.6 mg total) by mouth at bedtime as needed for mild constipation.   Spiriva HandiHaler 18 MCG inhalation capsule Generic drug:  tiotropium Place 18 mcg into inhaler and inhale daily.   Xarelto 10 MG Tabs tablet Generic drug:  rivaroxaban Take 10 mg by mouth every evening.        DISCHARGE INSTRUCTIONS:  If you experience worsening of your admission symptoms, develop shortness of breath, life threatening emergency, suicidal or homicidal thoughts you must seek medical attention immediately by calling 911 or calling your MD immediately  if symptoms less severe.  You Must read complete instructions/literature along with all the possible adverse reactions/side effects for all the Medicines you take and that have been prescribed to you. Take any new Medicines after you have completely  understood and accept all the possible adverse reactions/side effects.   Please note  You were cared for by a hospitalist during your hospital stay. If you have any questions about your discharge medications or the care you received while you were in the hospital after you are discharged, you can call the unit and asked to speak with the hospitalist on call if the hospitalist that took care of you is not available. Once you are discharged, your primary care physician will handle any further medical issues. Please note that NO REFILLS for any discharge medications will be authorized once you are discharged, as it is imperative that you return to your primary care physician (or establish a relationship with a primary care physician if you do not have one) for your aftercare needs so that they can reassess your need for medications and monitor your lab values.    Today   CHIEF COMPLAINT:   Chief  Complaint  Patient presents with  . Wound Infection    HISTORY OF PRESENT ILLNESS:  63 y.o. female who presents with chief complaint as above.  Patient arrived to the ED via EMS after her home health nurse called with concerns that her surgical wound site is not healing.  Patient has been recently admitted here in the hospital for this problem.  Per IDs recommendations on discharge she was supposed to be getting IV linezolid due to wound culture growing VRE.  Work-up in the ED tonight shows development of abscess in the same area.  Surgery contacted by ED, the patient states she does not wish to have surgery.  Family could not be reached.  Surgical team recommended admission with hospitalist service for IV antibiotics until more clarification on the patient's wishes for possible intervention for this abscess can be obtained from her and/or family.  VITAL SIGNS:  Blood pressure 91/72, pulse (!) 108, temperature 98 F (36.7 C), temperature source Oral, resp. rate 14, height 5\' 1"  (1.549 m), weight 48.6 kg, SpO2 96 %.  I/O:    Intake/Output Summary (Last 24 hours) at 09/28/2018 1159 Last data filed at 09/28/2018 0912 Gross per 24 hour  Intake 442.16 ml  Output 50 ml  Net 392.16 ml    PHYSICAL EXAMINATION:  GENERAL:  63 y.o.-year-old patient lying in the bed with no acute distress.  EYES: Pupils equal, round, reactive to light and accommodation. No scleral icterus. Extraocular muscles intact.  HEENT: Head atraumatic, normocephalic. Oropharynx and nasopharynx clear.  NECK:  Supple, no jugular venous distention. No thyroid enlargement, no tenderness.  LUNGS: Normal breath sounds bilaterally, no wheezing, rales,rhonchi or crepitation. No use of accessory muscles of respiration.  CARDIOVASCULAR: S1, S2 normal. No murmurs, rubs, or gallops.  ABDOMEN: Soft, non-tender, non-distended. Bowel sounds present. No organomegaly or mass.  EXTREMITIES: No pedal edema, cyanosis, or clubbing.  NEUROLOGIC:  Cranial nerves II through XII are intact. Muscle strength 5/5 in all extremities. Sensation intact. Gait not checked.  PSYCHIATRIC: The patient is alert and oriented x 3.  SKIN: No obvious rash, lesion, or ulcer.   DATA REVIEW:   CBC Recent Labs  Lab 09/28/18 0400  WBC 12.1*  HGB 10.0*  HCT 31.5*  PLT 147*    Chemistries  Recent Labs  Lab 09/26/18 0626  09/28/18 0400  NA 138   < > 137  K 3.5   < > 4.4  CL 107   < > 107  CO2 22   < > 16*  GLUCOSE 107*   < >  212*  BUN 18   < > 40*  CREATININE 0.88   < > 2.09*  2.12*  CALCIUM 7.8*   < > 7.8*  MG 1.9  --   --    < > = values in this interval not displayed.    Cardiac Enzymes No results for input(s): TROPONINI in the last 168 hours.  Microbiology Results  Results for orders placed or performed during the hospital encounter of 09/21/18  Culture, blood (routine x 2)     Status: None   Collection Time: 09/21/18  4:53 PM  Result Value Ref Range Status   Specimen Description BLOOD BLOOD LEFT ARM  Final   Special Requests   Final    BOTTLES DRAWN AEROBIC AND ANAEROBIC Blood Culture adequate volume   Culture   Final    NO GROWTH 5 DAYS Performed at [facility], [address] [place], [place] [address]    Report Status 09/26/2018 FINAL  Final  Culture, blood (routine x 2)     Status: None   Collection Time: 09/21/18  4:53 PM  Result Value Ref Range Status   Specimen Description BLOOD LEFT ANTECUBITAL  Final   Special Requests   Final    BOTTLES DRAWN AEROBIC AND ANAEROBIC Blood Culture results may not be optimal due to an inadequate volume of blood received in culture bottles   Culture   Final    NO GROWTH 5 DAYS Performed at [facility], [address] [place], [place] [address]    Report Status 09/26/2018 FINAL  Final  MRSA PCR Screening     Status: Abnormal   Collection Time: 09/22/18 11:56 AM  Result Value Ref Range Status   MRSA by PCR POSITIVE (A) NEGATIVE Final    Comment:         The GeneXpert MRSA Assay (FDA approved for NASAL specimens only), is one component of a comprehensive MRSA colonization surveillance program. It is not intended to diagnose MRSA infection nor to guide or monitor treatment for MRSA infections. RESULT CALLED TO, READ BACK BY AND VERIFIED WITH: LACEY HITT AT 1422 ON 09/22/2018 [facility]. Performed at [facility], [address] [place], [place] [address]   Aerobic/Anaerobic Culture (surgical/deep wound)     Status: None (Preliminary result)   Collection Time: 09/24/18  3:15 PM  Result Value Ref Range Status   Specimen Description ABSCESS  Final   Special Requests LEFT GLUTEAL  Final   Gram Stain   Final    MODERATE WBC PRESENT, PREDOMINANTLY PMN NO ORGANISMS SEEN    Culture   Final    NO GROWTH 4 DAYS NO ANAEROBES ISOLATED; CULTURE IN PROGRESS FOR 5 DAYS Performed at [facility], [address], [place], [place] [address]    Report Status PENDING  Incomplete    RADIOLOGY:  No results found.  EKG:   Orders placed or performed during the hospital encounter of 09/05/18  . ED EKG 12-Lead  . ED EKG 12-Lead      Management plans discussed with the patient, family and they are in agreement.  CODE STATUS:     Code Status Orders  (From admission, onward)         Start     Ordered   09/28/18 1140  Do not attempt resuscitation (DNR)  Continuous    Question Answer Comment  In the event of cardiac or respiratory ARREST Do not call a "code blue"   In the event of cardiac  or respiratory ARREST Do not perform Intubation, CPR, defibrillation or ACLS   In the event of cardiac or respiratory ARREST Use medication by any route, position, wound care, and other measures to relive pain and suffering. May use oxygen, suction and manual treatment of airway obstruction as needed for comfort.   Comments nurse to pronounce      09/28/18 1140        Code Status History    Date Active Date Inactive Code Status Order ID  Comments User Context   09/28/2018 1138 09/28/2018 1140 DNR [id number]  Bertrum Sol, MD Inpatient   09/21/2018 2210 09/28/2018 1138 Full Code [id number]  Oralia Manis, MD Inpatient   09/05/2018 1940 09/13/2018 2224 Full Code [id number]  Bertrum Sol, MD Inpatient   08/21/2018 1658 08/25/2018 1932 DNR [id number]  Adrian Saran, MD ED   08/21/2018 1656 08/21/2018 1658 DNR [id number]  Adrian Saran, MD ED   07/21/2018 1302 07/26/2018 2148 DNR [id number]  Glee Arvin, NP Inpatient   07/09/2018 1730 07/21/2018 1302 Partial Code [id number]  Mayo, Allyn Kenner, MD Inpatient   06/03/2018 0621 06/13/2018 2013 Full Code [id number]  Arnaldo Natal, MD Inpatient   05/23/2018 0253 06/01/2018 2158 Full Code [id number]  Cammy Copa, MD Inpatient   05/23/2018 0138 05/23/2018 0253 DNR [id number]  Cammy Copa, MD ED   04/10/2018 1111 04/11/2018 1554 DNR [id number]  Milagros Loll, MD Inpatient   04/09/2018 1519 04/10/2018 1111 Full Code [id number]  Milagros Loll, MD Inpatient   04/09/2018 1337 04/09/2018 1519 DNR [id number]  Milagros Loll, MD Inpatient   04/09/2018 0012 04/09/2018 1337 Full Code [id number]  Oralia Manis, MD ED      TOTAL TIME TAKING CARE OF THIS PATIENT: 40 minutes.    Evelena Asa  M.D on 09/28/2018 at 11:59 AM  Between 7am to 6pm - Pager - [phone]  After 6pm go to [email or web address] - password [employer]  [facility] [place] [facility]  Office  [phone]  CC: Primary care physician; Mickel Fuchs, MD   Note: This dictation was prepared with Dragon dictation along with smaller phrase technology. Any transcriptional errors that result from this process are unintentional.

## 2018-09-28 NOTE — Progress Notes (Signed)
EMS called for transport.

## 2018-09-28 NOTE — Progress Notes (Signed)
Patient noted to be hypotensive this AM. MD Salary notified. Note new orders

## 2018-09-28 NOTE — Progress Notes (Signed)
IV access removed. Wound vac removed. Wet to dry dressing placed for transport home via EMS for Palliative Care.

## 2018-09-28 NOTE — TOC Transition Note (Addendum)
Transition of Care [facility]) - CM/SW Discharge Note   Patient Details  Name: April [name] MRN: [id number] Date of Birth: 02/17/1956  Transition of Care [name], RN Phone Number: 09/28/2018, 1:02 PM   Clinical Narrative:  Patient set to be discharged home with hospice, MD notified [name] team that the family/POA now wish to pursue hospice care. Per Radene Knee, who is listed as POA he "defineitly thinks it is time for hospice to be involved, Verlin has told me this several times and just want to follow her wishes". He goes on to say "I know its hard for [place] here and not everybody in the family agrees but Denaisha wants to be home, here with all of [place]". Patient also now DNR per MD. Patient was previously receiving home health with [facility] in addition to palliative services. She is followed by [place] palliative care. Per their preference referral for hospice care placed with [facility]. Faxed all needed info to [place] with [facility]. Patient has all needed DME in the home. EMS will be used for transport.      Final next level of care: Home w Hospice Care Barriers to Discharge: No Barriers Identified   Patient Goals and CMS Choice   CMS Medicare.gov Compare Post Acute Care list provided to:: Patient Represenative (must comment) Choice offered to / list presented to : [facility] POA / Guardian  Discharge Placement                       Discharge Plan and Services   Discharge Planning Services: CM Consult                  [facility] Agency: [facility]   Social Determinants of Health (SDOH) Interventions     Readmission Risk Interventions Readmission Risk Prevention Plan 09/24/2018 09/11/2018  Transportation Screening Complete Complete  Medication Review [facility]) Referral to Pharmacy Complete  HRI or Home Care Consult Complete Complete  Palliative Care Screening (No Data) Complete  Skilled Nursing Facility - Patient Refused  Some recent data might be hidden

## 2018-09-29 DIAGNOSIS — L0231 Cutaneous abscess of buttock: Secondary | ICD-10-CM

## 2018-09-29 LAB — AEROBIC/ANAEROBIC CULTURE W GRAM STAIN (SURGICAL/DEEP WOUND): Culture: NO GROWTH

## 2018-10-31 DEATH — deceased

## 2020-01-22 IMAGING — CR DG FOOT COMPLETE 3+V*R*
3 series · 3 of 3 positions shown · non-contrast
Comparison: 05/17/2017

CLINICAL DATA: Swelling to the right foot for 3 days. Blister of
the great toe. Amputation a year ago. No injury.

EXAM:
RIGHT FOOT COMPLETE - 3+ VIEW

[foot ap]
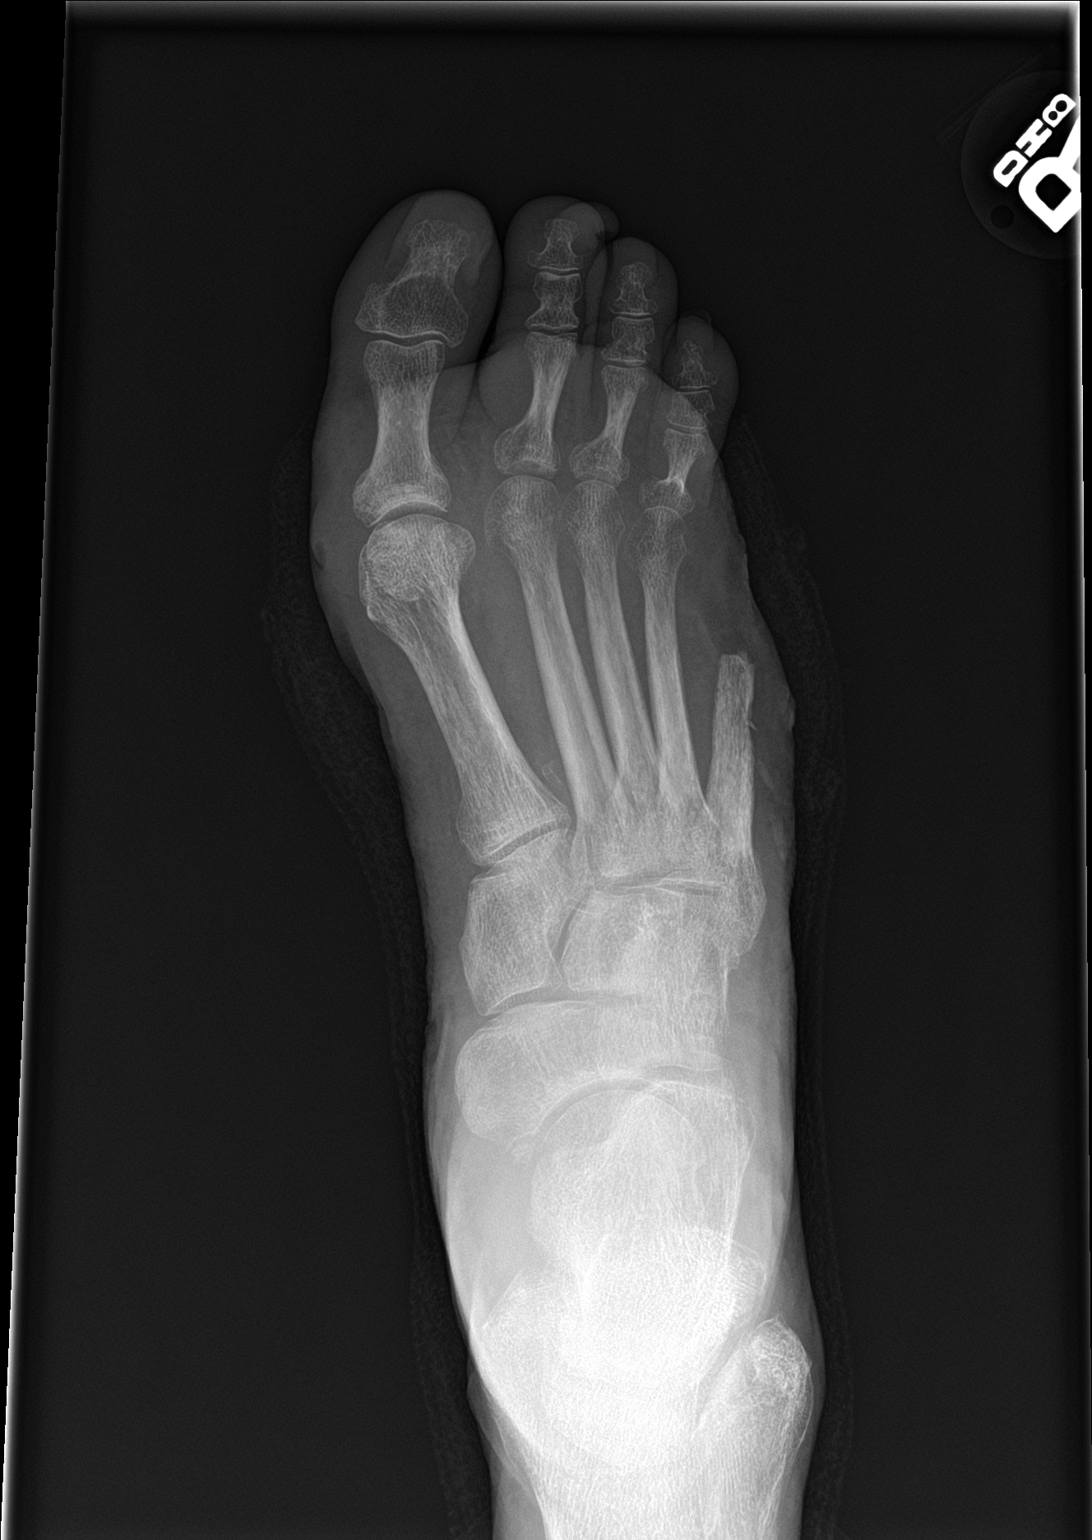

[foot obl]
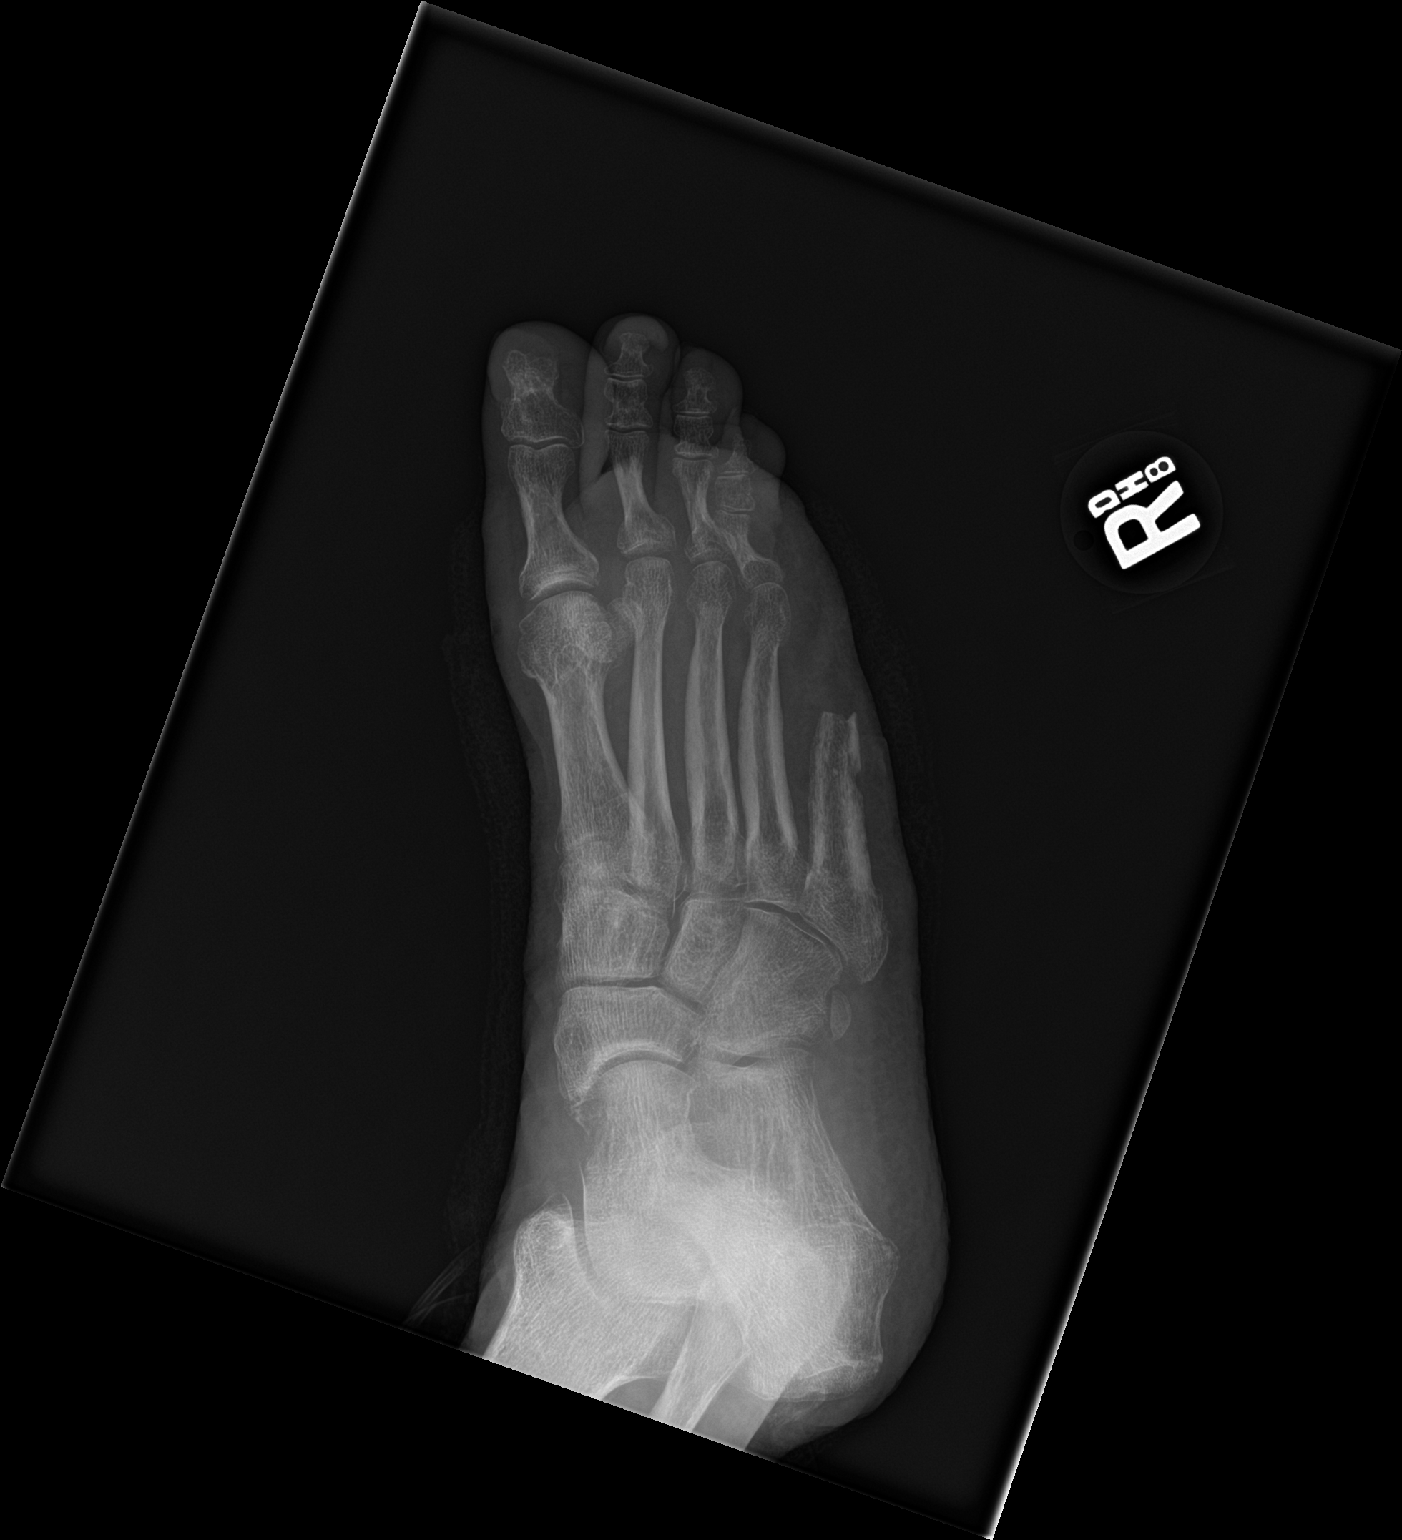

[foot lat]
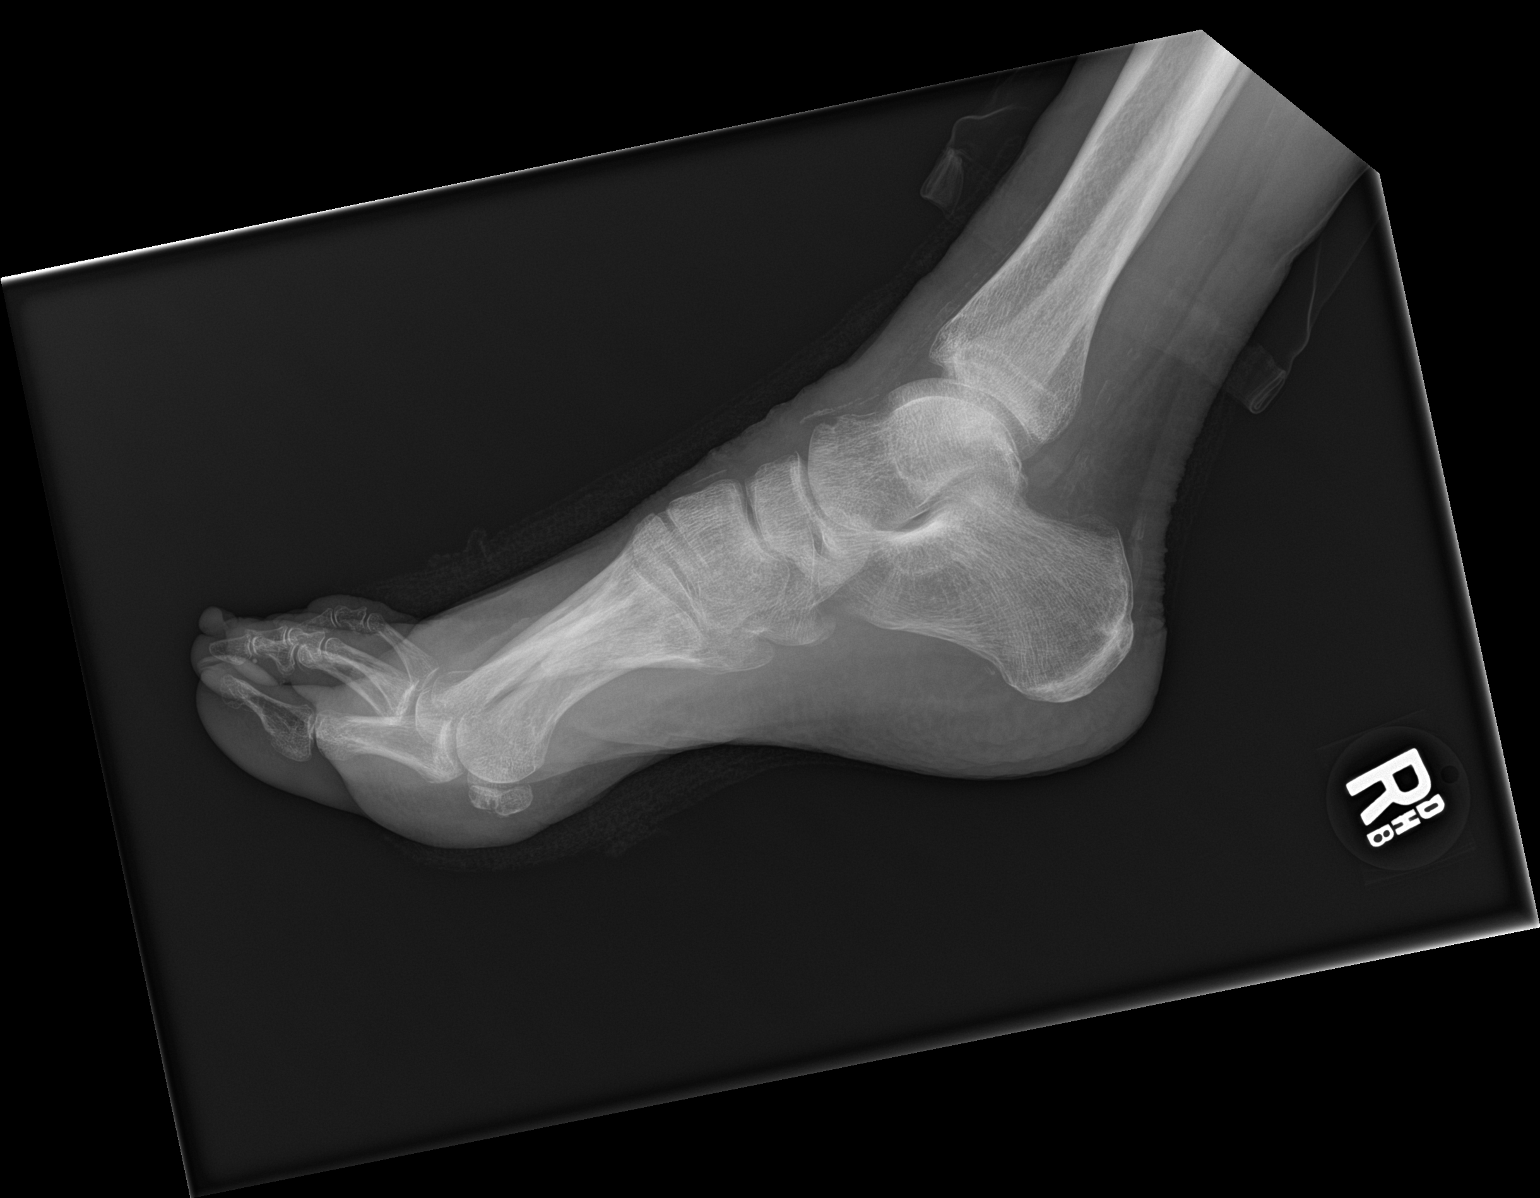

[3 of 3 positions shown; findings below may reference images not displayed]

FINDINGS: Interval amputation of the right fifth ray at the mid metatarsal
level since previous study. Focal cortical scalloping along the
distal fifth metatarsal stump probably representing postoperative
change. Can't entirely exclude sequela of chronic osteomyelitis.
Diffuse bone demineralization. No evidence of acute fracture or
dislocation. No definite bone sclerosis or cortical erosion to
suggest new osteomyelitis. No expansile or destructive bone lesions
appreciated. There is small amount of soft tissue gas medial to the
first metatarsal head likely representing small ulceration. No
radiopaque foreign bodies. Vascular calcifications.
IMPRESSION: 1. Amputation of the right fifth ray at the mid metatarsal level.
Focal cortical scalloping is probably postoperative.
2. No definite evidence of any new osteomyelitis.
3. Soft tissue gas adjacent to the first metatarsal head may reflect
ulceration.

## 2020-03-08 IMAGING — MR MR FOOT*L* W/O CM
6 series · 40 of 40 positions shown · non-contrast
Comparison: None.

CLINICAL DATA: Bilateral forefoot pain. History of right toe
amputation. Noncompliant diabetic.

EXAM:
MRI OF THE LEFT FOOT WITHOUT CONTRAST
TECHNIQUE: Multiplanar, multisequence MR imaging of the left forefoot was
performed. No intravenous contrast was administered.

[Series 4: T1 · oblique · left · 3.0mm · 0.38mm/px · 9 of 45 slices shown (1 of 2)]
[im 1/45]
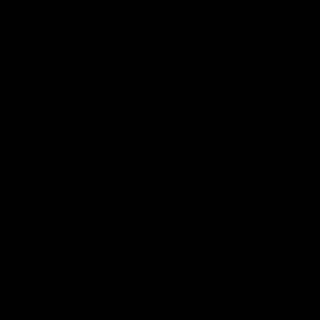
[im 6/45]
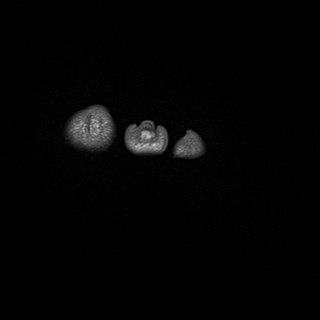
[im 12/45]
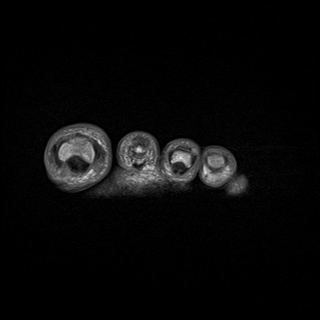
[im 17/45]
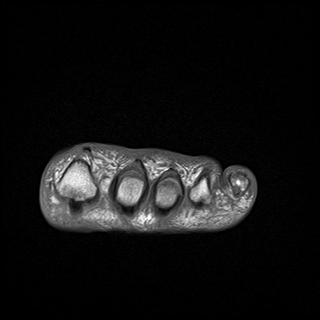
[im 23/45]
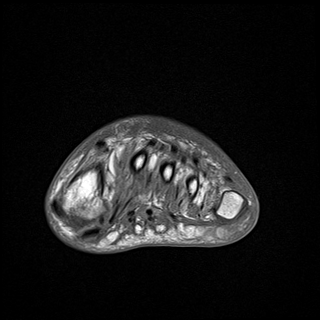
[im 28/45]
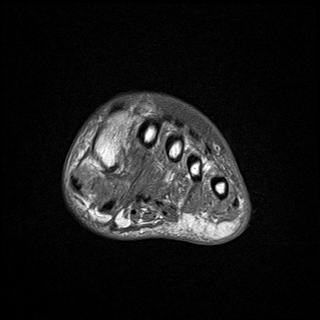
[im 34/45]
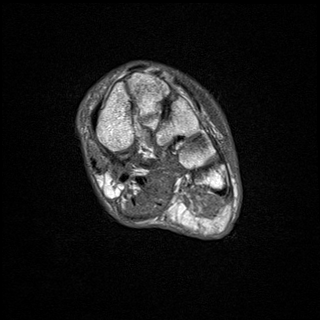
[im 39/45]
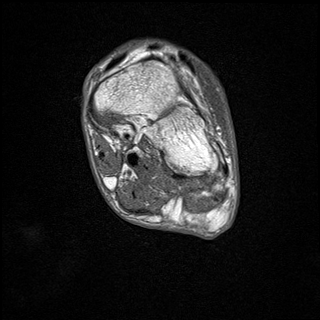
[im 45/45]
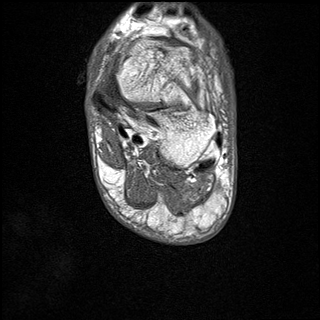

[Series 5: T2 · oblique · left · 3.0mm · 0.38mm/px · 10 of 45 slices shown (1 of 3)]
[im 1/45]
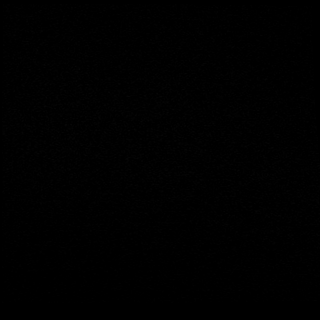
[im 5/45]
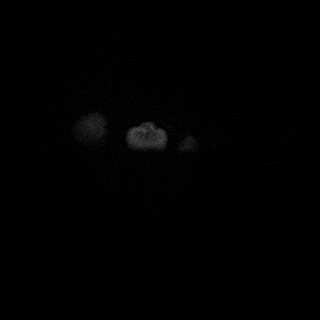
[im 10/45]
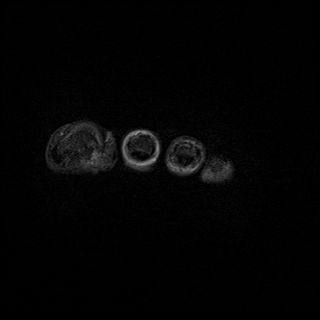
[im 15/45]
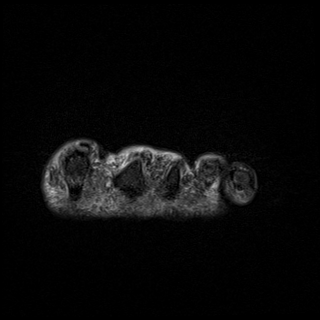
[im 20/45]
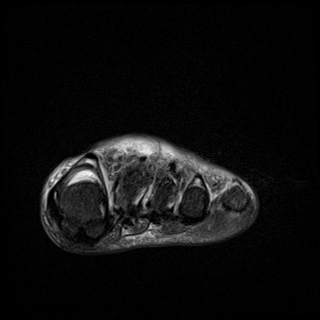
[im 25/45]
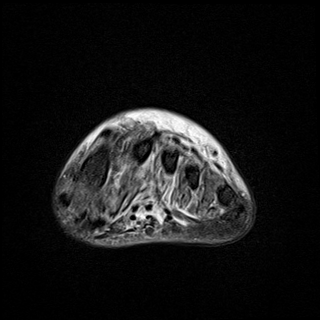
[im 30/45]
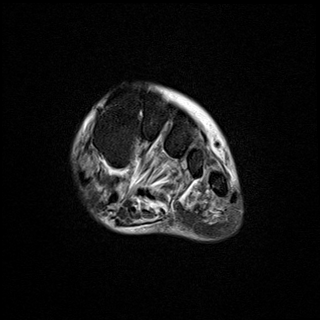
[im 35/45]
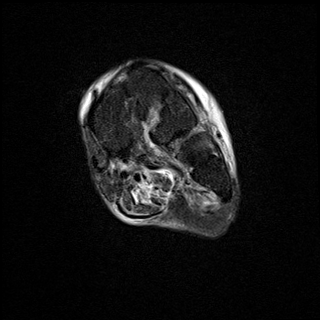
[im 40/45]
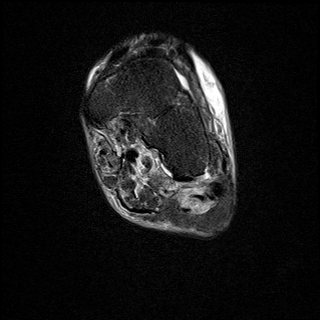
[im 45/45]
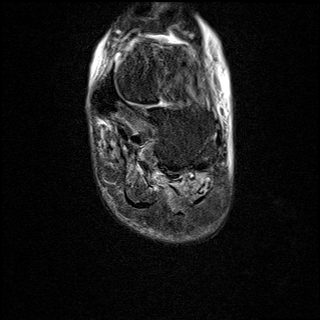

[Series 7: STIR · sagittal · left · 3.0mm · 0.62mm/px · 6 of 27 slices shown]
[im 1/27]
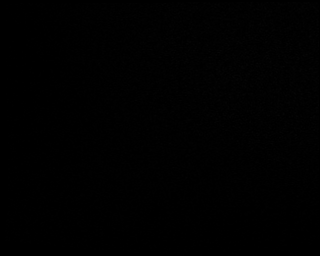
[im 6/27]
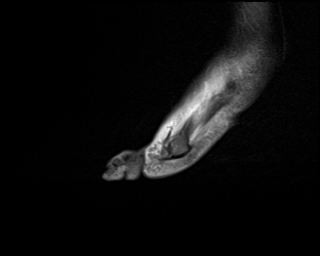
[im 11/27]
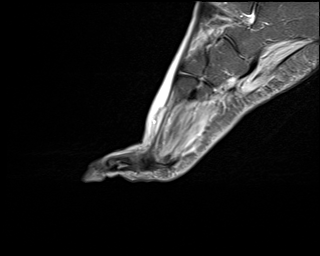
[im 16/27]
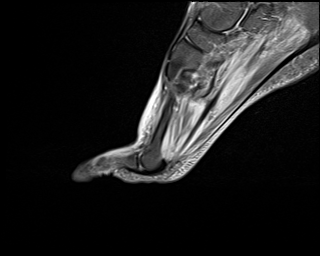
[im 21/27]
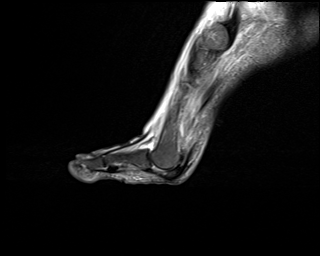
[im 27/27]
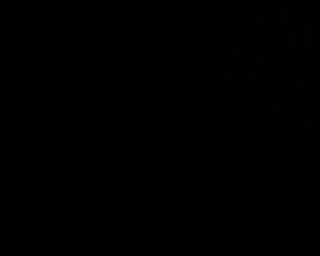

[Series 8: T1 · oblique · left · 3.0mm · 0.70mm/px · 5 of 20 slices shown (2 of 2)]
[im 1/20]
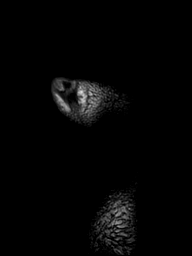
[im 5/20]
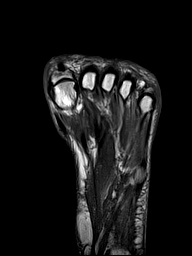
[im 10/20]
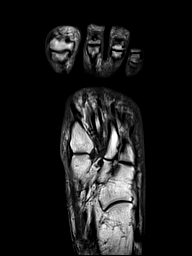
[im 15/20]
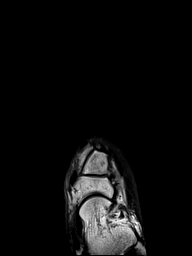
[im 20/20]
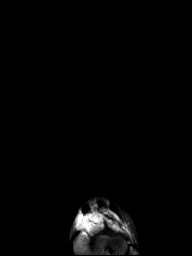

[Series 9: T2 · oblique · left · 3.0mm · 0.70mm/px · 5 of 20 slices shown (2 of 3)]
[im 1/20]
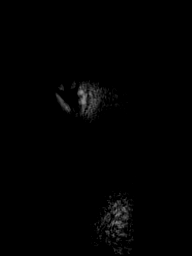
[im 5/20]
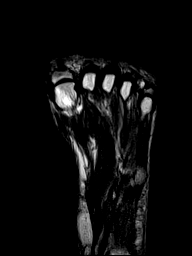
[im 10/20]
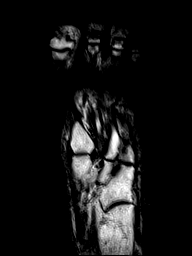
[im 15/20]
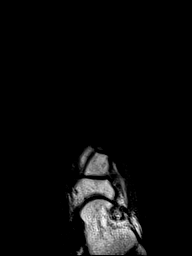
[im 20/20]
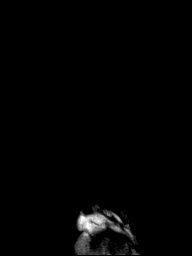

[Series 10: T2 · oblique · left · 3.0mm · 0.70mm/px · 5 of 20 slices shown (3 of 3)]
[im 1/20]
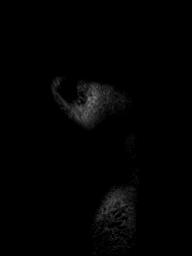
[im 5/20]
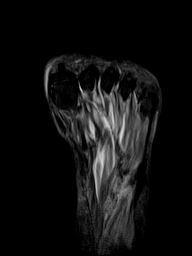
[im 10/20]
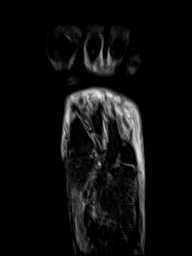
[im 15/20]
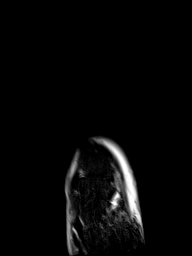
[im 20/20]
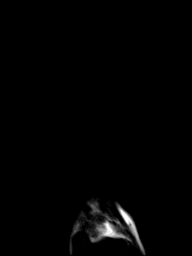

[40 of 40 positions shown; findings below may reference images not displayed]

FINDINGS: Bones/Joint/Cartilage

There is mild T2 hyperintensity within the marrow of the distal 3rd
phalanx. There is no abnormal T1 signal or cortical destruction. The
visualized bones are otherwise normal. There are no significant
joint effusions or arthropathic changes.

Ligaments

The Lisfranc ligament is intact.

Muscles and Tendons

There is mild generalized T2 hyperintensity throughout the forefoot
musculature. There is a small amount fluid within the flexor tendon
sheaths.

Soft tissues

Generalized soft tissue edema, especially within the dorsal
subcutaneous fat. No focal fluid collection or skin ulceration
identified.
IMPRESSION: 1. Generalized soft tissue edema may relate to cellulitis. No focal
fluid collection or skin ulceration identified.
2. Nonspecific T2 hyperintensity within the distal phalanx of the
3rd toe. This may be secondary to hyperemia. There is no cortical
destruction or abnormal T1 signal to confirm osteomyelitis. No
adjacent focal soft tissue abnormality or joint effusion.
